# Patient Record
Sex: Female | Born: 1965 | Race: Black or African American | Hispanic: No | Smoking: Never smoker
Health system: Southern US, Community
[De-identification: ages and names within clinical notes are randomized; demographics above are authoritative.]

## PROBLEM LIST (undated history)

## (undated) DIAGNOSIS — K219 Gastro-esophageal reflux disease without esophagitis: Secondary | ICD-10-CM

## (undated) DIAGNOSIS — R569 Unspecified convulsions: Secondary | ICD-10-CM

## (undated) DIAGNOSIS — G893 Neoplasm related pain (acute) (chronic): Secondary | ICD-10-CM

## (undated) DIAGNOSIS — F32A Depression, unspecified: Secondary | ICD-10-CM

## (undated) DIAGNOSIS — Z9221 Personal history of antineoplastic chemotherapy: Secondary | ICD-10-CM

## (undated) DIAGNOSIS — F419 Anxiety disorder, unspecified: Secondary | ICD-10-CM

## (undated) DIAGNOSIS — C50919 Malignant neoplasm of unspecified site of unspecified female breast: Secondary | ICD-10-CM

## (undated) HISTORY — PX: BRAIN SURGERY: SHX531

## (undated) HISTORY — DX: Neoplasm related pain (acute) (chronic): G89.3

## (undated) HISTORY — PX: ORIF ANKLE FRACTURE: SHX5408

## (undated) HISTORY — PX: BREAST SURGERY: SHX581

## (undated) HISTORY — DX: Anxiety disorder, unspecified: F41.9

## (undated) HISTORY — PX: MASTECTOMY: SHX3

## (undated) HISTORY — DX: Malignant neoplasm of unspecified site of unspecified female breast: C50.919

## (undated) HISTORY — DX: Unspecified convulsions: R56.9

## (undated) HISTORY — DX: Gastro-esophageal reflux disease without esophagitis: K21.9

## (undated) HISTORY — DX: Personal history of antineoplastic chemotherapy: Z92.21

## (undated) HISTORY — DX: Depression, unspecified: F32.A

---

## 2018-11-03 LAB — CMP (EXT)
ALT/SGPT (EXT): 8 U/L (ref <34)
AST/SGOT (EXT): 13 U/L (ref <33)
Albumin (EXT): 4.2 g/dL (ref 3.5–5.2)
Alkaline Phosphatase (EXT): 70 U/L (ref 35–104)
Anion Gap (EXT): 12 mmol/L (ref 7–17)
BUN (EXT): 8 mg/dL (ref 6–23)
Bilirubin, Total (EXT): 0.6 mg/dL (ref 0.2–1.2)
CO2 (EXT): 26 mmol/L (ref 22–31)
CalciumCalcium (EXT): 9.4 mg/dL (ref 8.8–10.7)
Chloride (EXT): 103 mmol/L (ref 98–107)
Creatinine (EXT): 1.02 mg/dL (ref 0.50–1.20)
GFR Estimated (Calc) (EXT): 63 mL/min/{1.73_m2} (ref >59)
Globulin (EXT): 3.2 g/dL (ref 2.3–4.2)
Glucose (EXT): 113 mg/dL — ABNORMAL HIGH (ref 70–100)
Potassium (EXT): 3.6 mmol/L (ref 3.4–5.1)
Protein (EXT): 7.4 g/dL (ref 6.4–8.3)
Sodium (EXT): 141 mmol/L (ref 136–145)

## 2019-01-16 LAB — CMP (EXT)
ALT/SGPT (EXT): 6 U/L (ref <34)
AST/SGOT (EXT): 11 U/L (ref <33)
Albumin (EXT): 3.9 g/dL (ref 3.5–5.2)
Alkaline Phosphatase (EXT): 72 U/L (ref 35–104)
Anion Gap (EXT): 10 mmol/L (ref 7–17)
BUN (EXT): 12 mg/dL (ref 6–23)
Bilirubin, Total (EXT): 0.4 mg/dL (ref 0.2–1.2)
CO2 (EXT): 27 mmol/L (ref 22–31)
CalciumCalcium (EXT): 8.7 mg/dL — ABNORMAL LOW (ref 8.8–10.7)
Chloride (EXT): 106 mmol/L (ref 98–107)
Creatinine (EXT): 0.96 mg/dL (ref 0.50–1.20)
GFR Estimated (Calc) (EXT): 68 mL/min/{1.73_m2} (ref >59)
Globulin (EXT): 2.7 g/dL (ref 2.3–4.2)
Glucose (EXT): 101 mg/dL — ABNORMAL HIGH (ref 70–100)
Potassium (EXT): 3.9 mmol/L (ref 3.4–5.1)
Protein (EXT): 6.6 g/dL (ref 6.4–8.3)
Sodium (EXT): 143 mmol/L (ref 136–145)

## 2019-07-09 LAB — CMP (EXT)
ALT/SGPT (EXT): 6 U/L (ref <34)
AST/SGOT (EXT): 12 U/L (ref <33)
Albumin (EXT): 3.8 g/dL (ref 3.5–5.2)
Alkaline Phosphatase (EXT): 88 U/L (ref 35–104)
Anion Gap (EXT): 10 mmol/L (ref 7–17)
BUN (EXT): 11 mg/dL (ref 6–23)
Bilirubin, Total (EXT): 0.7 mg/dL (ref 0.2–1.2)
CO2 (EXT): 25 mmol/L (ref 22–31)
CalciumCalcium (EXT): 8.8 mg/dL (ref 8.8–10.7)
Chloride (EXT): 107 mmol/L (ref 98–107)
Creatinine (EXT): 0.95 mg/dL (ref 0.50–1.20)
GFR Estimated (Calc) (EXT): 69 mL/min/{1.73_m2} (ref >59)
Globulin (EXT): 3.2 g/dL (ref 2.3–4.2)
Glucose (EXT): 92 mg/dL (ref 70–100)
Potassium (EXT): 3.9 mmol/L (ref 3.4–5.1)
Protein (EXT): 7 g/dL (ref 6.4–8.3)
Sodium (EXT): 142 mmol/L (ref 136–145)

## 2019-10-09 LAB — CREATININE W/ EGFR (EXT)
Creatinine (EXT): 0.97 mg/dL (ref 0.50–1.20)
GFR Estimated (Calc) (EXT): 67 mL/min/{1.73_m2} (ref >59)

## 2019-10-09 LAB — UNMAPPED LAB RESULTS: BUN (EXT): 19 mg/dL (ref 6–23)

## 2019-12-15 LAB — CMP (EXT)
ALT/SGPT (EXT): 7 U/L (ref <34)
AST/SGOT (EXT): 14 U/L (ref <33)
Albumin (EXT): 4.3 g/dL (ref 3.5–5.2)
Alkaline Phosphatase (EXT): 82 U/L (ref 35–104)
Anion Gap (EXT): 7 mmol/L (ref 7–17)
BUN (EXT): 10 mg/dL (ref 6–23)
Bilirubin, Total (EXT): 0.5 mg/dL (ref 0.2–1.2)
CO2 (EXT): 28 mmol/L (ref 22–31)
CalciumCalcium (EXT): 9.3 mg/dL (ref 8.8–10.7)
Chloride (EXT): 104 mmol/L (ref 98–107)
Creatinine (EXT): 0.82 mg/dL (ref 0.50–1.20)
GFR Estimated (Calc) (EXT): 82 mL/min/{1.73_m2} (ref >59)
Globulin (EXT): 2.9 g/dL (ref 2.3–4.2)
Glucose (EXT): 91 mg/dL (ref 70–100)
Potassium (EXT): 4.7 mmol/L (ref 3.4–5.1)
Protein (EXT): 7.2 g/dL (ref 6.4–8.3)
Sodium (EXT): 139 mmol/L (ref 136–145)

## 2019-12-15 LAB — COMPREHENSIVE METABOLIC PANEL
Albumin: 4.3 (ref 3.5–5.0)
Calcium: 9.3 (ref 8.7–10.7)
Globulin: 2.9

## 2019-12-15 LAB — HEPATIC FUNCTION PANEL
ALT: 7 (ref 7–35)
AST: 14 (ref 13–35)
Alkaline Phosphatase: 82 (ref 25–125)

## 2019-12-15 LAB — BASIC METABOLIC PANEL
BUN: 10 (ref 4–21)
CO2: 28 — AB (ref 13–22)
Chloride: 104 (ref 99–108)
Creatinine: 0.8 (ref <=1.1)
Glucose: 91
Potassium: 4.7 (ref 3.4–5.3)
Sodium: 139 (ref 137–147)

## 2019-12-19 ENCOUNTER — Ambulatory Visit: Admitting: Specialist

## 2020-01-04 LAB — OCCULT BLOOD, FECAL (EXT)
Occult Blood, Fecal Spec 1 (EXT): NEGATIVE
Occult Blood, Fecal Spec 2 (EXT): NEGATIVE
Occult Blood, Fecal Spec 3 (EXT): NEGATIVE

## 2020-01-19 ENCOUNTER — Ambulatory Visit: Admitting: Specialist

## 2020-02-15 LAB — CBC AND DIFFERENTIAL
HCT: 37 (ref 36–46)
Hemoglobin: 11.9 — AB (ref 12.0–16.0)
Platelets: 175 (ref 150–399)
WBC: 2.7

## 2020-02-15 LAB — IRON,TIBC AND FERRITIN PANEL
Ferritin: 15
Iron: 67
UIBC: 241

## 2020-02-15 LAB — CBC: RBC: 3.98 (ref 3.87–5.11)

## 2020-02-15 LAB — VITAMIN B12: Vitamin B-12: 448

## 2020-04-06 ENCOUNTER — Other Ambulatory Visit: Payer: Self-pay

## 2020-04-06 ENCOUNTER — Ambulatory Visit: Payer: Medicare Other | Admitting: Podiatry

## 2020-04-22 ENCOUNTER — Other Ambulatory Visit: Payer: Self-pay

## 2020-04-22 ENCOUNTER — Ambulatory Visit (INDEPENDENT_AMBULATORY_CARE_PROVIDER_SITE_OTHER): Payer: Medicare Other | Admitting: Podiatry

## 2020-04-22 ENCOUNTER — Encounter: Payer: Self-pay | Admitting: Podiatry

## 2020-04-22 VITALS — BP 130/89 | HR 48

## 2020-04-22 DIAGNOSIS — Q828 Other specified congenital malformations of skin: Secondary | ICD-10-CM | POA: Diagnosis not present

## 2020-04-22 DIAGNOSIS — T451X5A Adverse effect of antineoplastic and immunosuppressive drugs, initial encounter: Secondary | ICD-10-CM

## 2020-04-22 DIAGNOSIS — M2042 Other hammer toe(s) (acquired), left foot: Secondary | ICD-10-CM

## 2020-04-22 DIAGNOSIS — B353 Tinea pedis: Secondary | ICD-10-CM | POA: Diagnosis not present

## 2020-04-22 DIAGNOSIS — M79675 Pain in left toe(s): Secondary | ICD-10-CM

## 2020-04-22 DIAGNOSIS — M2041 Other hammer toe(s) (acquired), right foot: Secondary | ICD-10-CM | POA: Diagnosis not present

## 2020-04-22 MED ORDER — CLOTRIMAZOLE-BETAMETHASONE 1-0.05 % EX CREA: TOPICAL_CREAM | CUTANEOUS | 1 refills | Status: DC

## 2020-04-22 NOTE — Patient Instructions (Addendum)
Hammer Toe  Hammer toe is a change in the shape (a deformity) of your toe. The deformity causes the middle joint of your toe to stay bent. This causes pain, especially when you are wearing shoes. Hammer toe starts gradually. At first, the toe can be straightened. Gradually over time, the deformity becomes stiff and permanent. Early treatments to keep the toe straight may relieve pain. As the deformity becomes stiff and permanent, surgery may be needed to straighten the toe. What are the causes? Hammer toe is caused by abnormal bending of the toe joint that is closest to your foot. It happens gradually over time. This pulls on the muscles and connections (tendons) of the toe joint, making them weak and stiff. It is often related to wearing shoes that are too short or narrow and do not let your toes straighten. What increases the risk? You may be at greater risk for hammer toe if you:  Are female.  Are older.  Wear shoes that are too small.  Wear high-heeled shoes that pinch your toes.  Are a ballet dancer.  Have a second toe that is longer than your big toe (first toe).  Injure your foot or toe.  Have arthritis.  Have a family history of hammer toe.  Have a nerve or muscle disorder. What are the signs or symptoms? The main symptoms of this condition are pain and deformity of the toe. The pain is worse when wearing shoes, walking, or running. Other symptoms may include:  Corns or calluses over the bent part of the toe or between the toes.  Redness and a burning feeling on the toe.  An open sore that forms on the top of the toe.  Not being able to straighten the toe. How is this diagnosed? This condition is diagnosed based on your symptoms and a physical exam. During the exam, your health care provider will try to straighten your toe to see how stiff the deformity is. You may also have tests, such as:  A blood test to check for rheumatoid arthritis.  An X-ray to show how  severe the deformity is. How is this treated? Treatment for this condition will depend on how stiff the deformity is. Surgery is often needed. However, sometimes a hammer toe can be straightened without surgery. Treatments that do not involve surgery include:  Taping the toe into a straightened position.  Using pads and cushions to protect the toe (orthotics).  Wearing shoes that provide enough room for the toes.  Doing toe-stretching exercises at home.  Taking an NSAID to reduce pain and swelling. If these treatments do not help or the toe cannot be straightened, surgery is the next option. The most common surgeries used to straighten a hammer toe include:  Arthroplasty. In this procedure, part of the joint is removed, and that allows the toe to straighten.  Fusion. In this procedure, cartilage between the two bones of the joint is taken out and the bones are fused together into one longer bone.  Implantation. In this procedure, part of the bone is removed and replaced with an implant to let the toe move again.  Flexor tendon transfer. In this procedure, the tendons that curl the toes down (flexor tendons) are repositioned. Follow these instructions at home:  Take over-the-counter and prescription medicines only as told by your health care provider.  Do toe straightening and stretching exercises as told by your health care provider.  Keep all follow-up visits as told by your health care   provider. This is important. How is this prevented?  Wear shoes that give your toes enough room and do not cause pain.  Do not wear high-heeled shoes. Contact a health care provider if:  Your pain gets worse.  Your toe becomes red or swollen.  You develop an open sore on your toe. This information is not intended to replace advice given to you by your health care provider. Make sure you discuss any questions you have with your health care provider. Document Revised: 07/26/2017 Document  Reviewed: 12/07/2015 Elsevier Patient Education  [place] are small areas of thickened skin that occur on the top, sides, or tip of a toe. They contain a cone-shaped core with a point that can press on a nerve below. This causes pain.  Calluses are areas of thickened skin that can occur anywhere on the body, including the hands, fingers, palms, soles of the feet, and heels. Calluses are usually larger than corns. What are the causes? Corns and calluses are caused by rubbing (friction) or pressure, such as from shoes that are too tight or do not fit properly. What increases the risk? Corns are more likely to develop in people who have misshapen toes (toe deformities), such as hammer toes. Calluses can occur with friction to any area of the skin. They are more likely to develop in people who:  Work with their hands.  Wear shoes that fit poorly, are too tight, or are high-heeled.  Have toe deformities. What are the signs or symptoms? Symptoms of a corn or callus include:  A hard growth on the skin.  Pain or tenderness under the skin.  Redness and swelling.  Increased discomfort while wearing tight-fitting shoes, if your feet are affected. If a corn or callus becomes infected, symptoms may include:  Redness and swelling that gets worse.  Pain.  Fluid, blood, or pus draining from the corn or callus. How is this diagnosed? Corns and calluses may be diagnosed based on your symptoms, your medical history, and a physical exam. How is this treated? Treatment for corns and calluses may include:  Removing the cause of the friction or pressure. This may involve: ? Changing your shoes. ? Wearing shoe inserts (orthotics) or other protective layers in your shoes, such as a corn pad. ? Wearing gloves.  Applying medicine to the skin (topical medicine) to help soften skin in the hardened, thickened areas.  Removing layers of dead skin with a file to  reduce the size of the corn or callus.  Removing the corn or callus with a scalpel or laser.  Taking antibiotic medicines, if your corn or callus is infected.  Having surgery, if a toe deformity is the cause. Follow these instructions at home:   Take over-the-counter and prescription medicines only as told by your health care provider.  If you were prescribed an antibiotic, take it as told by your health care provider. Do not stop taking it even if your condition starts to improve.  Wear shoes that fit well. Avoid wearing high-heeled shoes and shoes that are too tight or too loose.  Wear any padding, protective layers, gloves, or orthotics as told by your health care provider.  Soak your hands or feet and then use a file or pumice stone to soften your corn or callus. Do this as told by your health care provider.  Check your corn or callus every day for symptoms of infection. Contact a health care provider if you:  Notice that your symptoms do not improve with treatment.  Have redness or swelling that gets worse.  Notice that your corn or callus becomes painful.  Have fluid, blood, or pus coming from your corn or callus.  Have new symptoms. Summary  Corns are small areas of thickened skin that occur on the top, sides, or tip of a toe.  Calluses are areas of thickened skin that can occur anywhere on the body, including the hands, fingers, palms, and soles of the feet. Calluses are usually larger than corns.  Corns and calluses are caused by rubbing (friction) or pressure, such as from shoes that are too tight or do not fit properly.  Treatment may include wearing any padding, protective layers, gloves, or orthotics as told by your health care provider. This information is not intended to replace advice given to you by your health care provider. Make sure you discuss any questions you have with your health care provider. Document Revised: 12/03/2018 Document Reviewed:  06/26/2017 Elsevier Patient Education  2020 [employer].

## 2020-04-24 ENCOUNTER — Encounter: Payer: Self-pay | Admitting: Podiatry

## 2020-04-24 NOTE — Progress Notes (Signed)
Subjective: Monica Mccoy presents today for complaint of painful corn(s) b/l 5th digits  which interfere(s) with ambulation. Corns have been present for the past 3-4 years. Aggravating factors include wearing enclosed shoe gear.  She has also used OTC corn remover on the digits.   She has just moved to [place] from [place]. She has not seen a new PCP as of today.  She last saw her PCP, Monica Mccoy. Monica Mccoy, M.D, on 02/24/2020. Dr. Lacy Mccoy is located at [address], [place], [place], [place], [address].  Past Medical History:  Diagnosis Date  . Breast cancer metastasized to brain [facility])    Left breast  . Chronic pain after cancer treatment   . GERD (gastroesophageal reflux disease)   . History of cancer chemotherapy    Breast cancer left breast  . Seizure ([place])      There are no problems to display for this patient.    History reviewed. No pertinent surgical history.   Current Outpatient Medications on File Prior to Visit  Medication Sig Dispense Refill  . gabapentin (NEURONTIN) 300 MG capsule Take 300 mg by mouth 3 (three) times daily.    Marland Kitchen levETIRAcetam (KEPPRA) 1000 MG tablet Take 1,000 mg by mouth 2 (two) times daily.    . pantoprazole (PROTONIX) 40 MG tablet Take 40 mg by mouth daily.    Marland Kitchen PARoxetine (PAXIL) 20 MG tablet Take 20 mg by mouth daily.    . traZODone (DESYREL) 100 MG tablet Take 100 mg by mouth at bedtime.     No current facility-administered medications on file prior to visit.     Allergies  Allergen Reactions  . Penicillins   . Tramadol      Social History   Occupational History  . Not on file  Tobacco Use  . Smoking status: Never Smoker  . Smokeless tobacco: Never Used  Substance and Sexual Activity  . Alcohol use: Yes    Comment: On occasion  . Drug use: Not on file  . Sexual activity: Not on file     History reviewed. No pertinent family history.    There is no immunization history on file for this patient.    Objective: Monica Mccoy is a pleasant 54 y.o. female morbidly obese in NAD.Marland Kitchen AAO x 3.  Vitals:   04/22/20 0934  BP: 130/89  Pulse: (!) 48    Vascular Examination:  Capillary fill time to digits <3 seconds b/l lower extremities. Palpable DP pulse(s) b/l lower extremities Faintly palpable PT pulse(s) b/l lower extremities. Pedal hair absent. Lower extremity skin temperature gradient within normal limits. No pain with calf compression b/l. No edema noted b/l lower extremities. No ischemia or gangrene noted b/l lower extremities.  Dermatological Examination: Pedal skin with normal turgor, texture and tone bilaterally. No interdigital macerations bilaterally. Toenails 1-5 b/l well maintained with adequate length. No erythema, no edema, no drainage, no flocculence. Porokeratotic lesion(s) L 5th toe and R 5th toe. No erythema, no edema, no drainage, no flocculence. Diffuse scaling noted peripherally and plantarly b/l feet with mild foot odor.  No interdigital macerations.  No blisters, no weeping. No signs of secondary bacterial infection noted.  Musculoskeletal: Normal muscle strength 5/5 to all lower extremity muscle groups bilaterally. Hammertoes noted to the L 5th toe and R 5th toe.  Neurological: Pt has subjective symptoms of neuropathy. Protective sensation decreased with 10 gram monofilament b/l. Vibratory sensation intact b/l.  Assessment: 1. Porokeratosis   2. Pain in toes of both feet  3. Tinea pedis of both feet   4. Acquired hammertoes of both feet   5. Chemotherapy-induced neuropathy ([place])     Plan: -Examined patient. -Painful porokeratotic lesion(s) L 5th toe and R 5th toe pared and enucleated with sterile scalpel blade without incident. -Patient to report any pedal injuries to medical professional immediately. -For tinea pedis, prescription written for Lotrisone Cream 1%/0.05% to be applied to both feet twice daily for 6 weeks. -Patient to continue soft, supportive  shoe gear daily. -Patient/POA to call should there be question/concern in the interim.  Return in about 3 months (around 07/23/2020) for nail and callus trim.

## 2020-06-28 ENCOUNTER — Other Ambulatory Visit: Payer: Self-pay

## 2020-06-29 ENCOUNTER — Encounter: Payer: Self-pay | Admitting: Nurse Practitioner

## 2020-06-29 ENCOUNTER — Ambulatory Visit (INDEPENDENT_AMBULATORY_CARE_PROVIDER_SITE_OTHER): Payer: Medicare Other | Admitting: Nurse Practitioner

## 2020-06-29 VITALS — BP 136/90 | HR 88 | Temp 97.1°F | Ht 65.75 in | Wt 294.2 lb

## 2020-06-29 DIAGNOSIS — G8929 Other chronic pain: Secondary | ICD-10-CM | POA: Diagnosis not present

## 2020-06-29 DIAGNOSIS — Z87898 Personal history of other specified conditions: Secondary | ICD-10-CM | POA: Insufficient documentation

## 2020-06-29 DIAGNOSIS — Z853 Personal history of malignant neoplasm of breast: Secondary | ICD-10-CM | POA: Insufficient documentation

## 2020-06-29 DIAGNOSIS — F331 Major depressive disorder, recurrent, moderate: Secondary | ICD-10-CM | POA: Diagnosis not present

## 2020-06-29 DIAGNOSIS — K219 Gastro-esophageal reflux disease without esophagitis: Secondary | ICD-10-CM | POA: Diagnosis not present

## 2020-06-29 DIAGNOSIS — R32 Unspecified urinary incontinence: Secondary | ICD-10-CM | POA: Insufficient documentation

## 2020-06-29 DIAGNOSIS — Z8669 Personal history of other diseases of the nervous system and sense organs: Secondary | ICD-10-CM

## 2020-06-29 MED ORDER — PANTOPRAZOLE SODIUM 40 MG PO TBEC: 40.0000 mg | DELAYED_RELEASE_TABLET | Freq: Every day | ORAL | 0 refills | Status: DC

## 2020-06-29 MED ORDER — GABAPENTIN 300 MG PO CAPS: ORAL_CAPSULE | ORAL | 5 refills | Status: DC

## 2020-06-29 MED ORDER — DULOXETINE HCL 30 MG PO CPEP: 30.0000 mg | ORAL_CAPSULE | Freq: Every day | ORAL | 5 refills | Status: DC

## 2020-06-29 NOTE — Patient Instructions (Signed)
Thank you for choosing [facility] for your health needs  Sign medical release to get records from previous neurology, primary provider, urology, and oncology.  Hold trazodone for now.  You will be contacted to schedule an appt with oncology and psychology

## 2020-06-29 NOTE — Assessment & Plan Note (Signed)
Resume pantoprazole To decrease dose to 20mg  if resolved symptoms in 73month.

## 2020-06-29 NOTE — Assessment & Plan Note (Signed)
Reports generalized joint and muscle pain. She was evaluated by pain clinic in [place], water aerobic recommended but she did not complete. She ran out of medication 63month ago: gabapentin 300 TID. Obtain medical records Resume gabapentin 300mg  titrate up to TID. start cymbalta 30mg .

## 2020-06-29 NOTE — Assessment & Plan Note (Signed)
S/p left mastectomy with possible brain mets per patient. She is to sign medical release form for records Entered oncology referral

## 2020-06-29 NOTE — Assessment & Plan Note (Signed)
No improvement with paxil No SI Start cymbalta 30mg , hold trazodone and entered referral to psychology. F/up in 17month

## 2020-06-29 NOTE — Assessment & Plan Note (Signed)
Reports she was unable to tolerate keppra Does not know how long she has been off medication Reports seizure activity during hospitalization 46months ago

## 2020-06-29 NOTE — Progress Notes (Signed)
Subjective:  Patient ID: Monica Mccoy, female    DOB: 1965-11-13  Age: 54 y.o. MRN: [id number]  CC: Establish Care (New patient-Pt c/o bad headaches x3 weeks, pt has been out of trazodone, pantoprazole, and gabapentin, keppra, paroxetine, and states this could be the cause of the headaches. )  HPI  Gastroesophageal reflux disease without esophagitis Resume pantoprazole To decrease dose to 20mg  if resolved symptoms in 30month.  Hx of breast cancer S/p left mastectomy with possible brain mets per patient. She is to sign medical release form for records Entered oncology referral  Other chronic pain Reports generalized joint and muscle pain. She was evaluated by pain clinic in [place], water aerobic recommended but she did not complete. She ran out of medication 3month ago: gabapentin 300 TID. Obtain medical records Resume gabapentin 300mg  titrate up to TID. start cymbalta 30mg .  Moderate episode of recurrent major depressive disorder (HCC) No improvement with paxil No SI Start cymbalta 30mg , hold trazodone and entered referral to psychology. F/up in 26month  Hx of seizure disorder Reports she was unable to tolerate keppra Does not know how long she has been off medication Reports seizure activity during hospitalization 85months ago  Depression screen [facility] 2/9 06/29/2020  Decreased Interest 2  Down, Depressed, Hopeless 2  PHQ - 2 Score 4  Altered sleeping 3  Tired, decreased energy 2  Change in appetite 2  Feeling bad or failure about yourself  0  Trouble concentrating 1  Moving slowly or fidgety/restless 2  Suicidal thoughts 0  PHQ-9 Score 14  Difficult doing work/chores Very difficult   GAD 7 : Generalized Anxiety Score 06/29/2020  Nervous, Anxious, on Edge 3  Control/stop worrying 2  Worry too much - different things 2  Trouble relaxing 3  Restless 2  Easily annoyed or irritable 2  Afraid - awful might happen 1  Total GAD 7 Score 15  Anxiety Difficulty Somewhat  difficult   Reviewed past Medical, Social and Family history today.  Outpatient Medications Prior to Visit  Medication Sig Dispense Refill  . clotrimazole-betamethasone (LOTRISONE) cream Apply to both feet and between toes bid x 6 weeks. 45 g 1  . gabapentin (NEURONTIN) 300 MG capsule Take 300 mg by mouth 3 (three) times daily.    Marland Kitchen levETIRAcetam (KEPPRA) 1000 MG tablet Take 1,000 mg by mouth 2 (two) times daily.    . pantoprazole (PROTONIX) 40 MG tablet Take 40 mg by mouth daily.    Marland Kitchen PARoxetine (PAXIL) 20 MG tablet Take 20 mg by mouth daily.    . traZODone (DESYREL) 100 MG tablet Take 100 mg by mouth at bedtime.     No facility-administered medications prior to visit.    ROS See HPI  Objective:  BP 136/90 (BP Location: Right Arm, Patient Position: Sitting, Cuff Size: Large)   Pulse 88   Temp (!) 97.1 F (36.2 C) (Temporal)   Ht 5' 5.75" (1.67 m)   Wt 294 lb 3.2 oz (133.4 kg)   SpO2 100%   BMI 47.85 kg/m   Physical Exam Vitals reviewed.  Constitutional:      Appearance: She is obese.  Cardiovascular:     Rate and Rhythm: Normal rate and regular rhythm.     Pulses: Normal pulses.     Heart sounds: Normal heart sounds.  Pulmonary:     Effort: Pulmonary effort is normal.     Breath sounds: Normal breath sounds.  Musculoskeletal:     Right lower leg: No edema.  Left lower leg: No edema.  Neurological:     Mental Status: She is alert and oriented to person, place, and time.  Psychiatric:        Mood and Affect: Mood is anxious. Affect is tearful.        Speech: Speech normal.        Behavior: Behavior is cooperative.        Thought Content: Thought content normal.    Assessment & Plan:  This visit occurred during the SARS-CoV-2 public health emergency.  Safety protocols were in place, including screening questions prior to the visit, additional usage of staff PPE, and extensive cleaning of exam room while observing appropriate contact time as indicated for  disinfecting solutions.   Monica Mccoy was seen today for establish care.  Diagnoses and all orders for this visit:  Moderate episode of recurrent major depressive disorder (HCC) -     DULoxetine (CYMBALTA) 30 MG capsule; Take 1 capsule (30 mg total) by mouth daily. -     Ambulatory referral to Psychology  Hx of breast cancer -     Ambulatory referral to Oncology  Other chronic pain -     gabapentin (NEURONTIN) 300 MG capsule; Take 1cap at HS x 3days, then 1cap BID x7days, then 1cal TID continuously  Gastroesophageal reflux disease without esophagitis -     pantoprazole (PROTONIX) 40 MG tablet; Take 1 tablet (40 mg total) by mouth daily.  Hx of seizure disorder    Problem List Items Addressed This Visit      Digestive   Gastroesophageal reflux disease without esophagitis    Resume pantoprazole To decrease dose to 20mg  if resolved symptoms in 21month.      Relevant Medications   pantoprazole (PROTONIX) 40 MG tablet     Other   Hx of breast cancer    S/p left mastectomy with possible brain mets per patient. She is to sign medical release form for records Entered oncology referral      Relevant Orders   Ambulatory referral to Oncology   Hx of seizure disorder    Reports she was unable to tolerate keppra Does not know how long she has been off medication Reports seizure activity during hospitalization 20months ago      Moderate episode of recurrent major depressive disorder ([place]) - Primary    No improvement with paxil No SI Start cymbalta 30mg , hold trazodone and entered referral to psychology. F/up in 89month      Relevant Medications   DULoxetine (CYMBALTA) 30 MG capsule   Other Relevant Orders   Ambulatory referral to Psychology   Other chronic pain    Reports generalized joint and muscle pain. She was evaluated by pain clinic in [place], water aerobic recommended but she did not complete. She ran out of medication 34month ago: gabapentin 300 TID. Obtain medical  records Resume gabapentin 300mg  titrate up to TID. start cymbalta 30mg .      Relevant Medications   gabapentin (NEURONTIN) 300 MG capsule   DULoxetine (CYMBALTA) 30 MG capsule      Follow-up: Return in about 4 weeks (around 07/27/2020) for Depression and pain (F2F, 26mins, fasting).  Wilfred Lacy, NP

## 2020-07-01 ENCOUNTER — Encounter: Payer: Self-pay | Admitting: Podiatry

## 2020-07-01 ENCOUNTER — Ambulatory Visit (INDEPENDENT_AMBULATORY_CARE_PROVIDER_SITE_OTHER): Payer: Medicare Other | Admitting: Podiatry

## 2020-07-01 ENCOUNTER — Other Ambulatory Visit: Payer: Self-pay

## 2020-07-01 DIAGNOSIS — M2042 Other hammer toe(s) (acquired), left foot: Secondary | ICD-10-CM | POA: Diagnosis not present

## 2020-07-01 DIAGNOSIS — M2041 Other hammer toe(s) (acquired), right foot: Secondary | ICD-10-CM

## 2020-07-01 DIAGNOSIS — B351 Tinea unguium: Secondary | ICD-10-CM | POA: Diagnosis not present

## 2020-07-01 DIAGNOSIS — M79675 Pain in left toe(s): Secondary | ICD-10-CM | POA: Diagnosis not present

## 2020-07-01 DIAGNOSIS — M79674 Pain in right toe(s): Secondary | ICD-10-CM

## 2020-07-02 NOTE — Progress Notes (Signed)
Subjective:   Patient ID: Monica Mccoy, female   DOB: 54 y.o.   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  Patient wants surgery but is going out of town in the next few months and wants to wait till afterwards and I did educate her on what would be required for surgical correction

## 2020-08-01 ENCOUNTER — Ambulatory Visit: Payer: Medicare Other | Admitting: Nurse Practitioner

## 2020-08-04 ENCOUNTER — Telehealth: Payer: Self-pay | Admitting: Nurse Practitioner

## 2020-08-04 NOTE — Telephone Encounter (Signed)
ok 

## 2020-08-04 NOTE — Telephone Encounter (Signed)
Ok with me as her mother is my patient

## 2020-08-04 NOTE — Telephone Encounter (Signed)
Patient is requesting to transfer her care from [facility] to Dr. Bryan Lemma. Stated she felt is was not a good fit.

## 2020-08-05 ENCOUNTER — Ambulatory Visit: Payer: Medicare Other | Admitting: Podiatry

## 2020-08-05 ENCOUNTER — Other Ambulatory Visit: Payer: Self-pay | Admitting: Nurse Practitioner

## 2020-08-05 DIAGNOSIS — F331 Major depressive disorder, recurrent, moderate: Secondary | ICD-10-CM

## 2020-08-05 NOTE — Telephone Encounter (Signed)
Last 0V 06/29/20 Last fill 06/29/20  #30/5 Pt requesting for #90 supply

## 2020-08-29 NOTE — Progress Notes (Signed)
Subjective:   Monica Mccoy is a 55 y.o. female who presents for Medicare Annual (Subsequent) preventive examination.  I connected with [place] today by telephone and verified that I am speaking with the correct person using two identifiers. Location patient: home Location provider: work Persons participating in the virtual visit: patient, Engineer, civil (consulting).    I discussed the limitations, risks, security and privacy concerns of performing an evaluation and management service by telephone and the availability of in person appointments. I also discussed with the patient that there may be a patient responsible charge related to this service. The patient expressed understanding and verbally consented to this telephonic visit.    Interactive audio and video telecommunications were attempted between this provider and patient, however failed, due to patient having technical difficulties OR patient did not have access to video capability.  We continued and completed visit with audio only.  Some vital signs may be absent or patient reported.   Time Spent with patient on telephone encounter: 40 minutes   Review of Systems     Cardiac Risk Factors include: obesity (BMI >30kg/m2);sedentary lifestyle     Objective:    Today's Vitals   08/30/20 0902 08/30/20 0903  Weight: 297 lb (134.7 kg)   Height: 5' 5.75" (1.67 m)   PainSc:  8    Body mass index is 48.3 kg/m.  Advanced Directives 08/30/2020  Does Patient Have a Medical Advance Directive? No  Would patient like information on creating a medical advance directive? Yes (MAU/Ambulatory/Procedural Areas - Information given)    Current Medications (verified) Outpatient Encounter Medications as of 08/30/2020  Medication Sig  . clotrimazole-betamethasone (LOTRISONE) cream Apply to both feet and between toes bid x 6 weeks.  . DULoxetine (CYMBALTA) 30 MG capsule TAKE 1 CAPSULE BY MOUTH EVERY DAY  . gabapentin (NEURONTIN) 300 MG capsule Take 1cap at HS x  3days, then 1cap BID x7days, then 1cal TID continuously  . pantoprazole (PROTONIX) 40 MG tablet Take 1 tablet (40 mg total) by mouth daily.   No facility-administered encounter medications on file as of 08/30/2020.    Allergies (verified) Penicillins and Tramadol   History: Past Medical History:  Diagnosis Date  . Breast cancer metastasized to brain [facility])    Left breast  . Chronic pain after cancer treatment   . GERD (gastroesophageal reflux disease)   . History of cancer chemotherapy    Breast cancer left breast  . Seizure [facility])    Past Surgical History:  Procedure Laterality Date  . BRAIN SURGERY     brain tumor  . BREAST SURGERY     History reviewed. No pertinent family history. Social History   Socioeconomic History  . Marital status: Single    Spouse name: Not on file  . Number of children: Not on file  . Years of education: Not on file  . Highest education level: Not on file  Occupational History  . Not on file  Tobacco Use  . Smoking status: Never Smoker  . Smokeless tobacco: Never Used  Substance and Sexual Activity  . Alcohol use: Yes    Comment: On occasion  . Drug use: Not on file  . Sexual activity: Not on file  Other Topics Concern  . Not on file  Social History Narrative  . Not on file   Social Determinants of Health   Financial Resource Strain: Medium Risk  . Difficulty of Paying Living Expenses: Somewhat hard  Food Insecurity: Food Insecurity Present  . Worried About Running  Out of Food in the Last Year: Sometimes true  . Ran Out of Food in the Last Year: Never true  Transportation Needs: No Transportation Needs  . Lack of Transportation (Medical): No  . Lack of Transportation (Non-Medical): No  Physical Activity: Insufficiently Active  . Days of Exercise per Week: 1 day  . Minutes of Exercise per Session: 30 min  Stress: No Stress Concern Present  . Feeling of Stress : Only a little  Social Connections: Socially Isolated  . Frequency of  Communication with Friends and Family: More than three times a week  . Frequency of Social Gatherings with Friends and Family: Once a week  . Attends Religious Services: Never  . Active Member of Clubs or Organizations: No  . Attends Archivist Meetings: Never  . Marital Status: Never married    Tobacco Counseling Counseling given: Not Answered   Clinical Intake:  Pre-visit preparation completed: Yes  Pain : 0-10 Pain Score: 8  Pain Type: Chronic pain Pain Location: Leg (patient states everything hurts) Pain Orientation: Right,Left Pain Onset: More than a month ago Pain Frequency: Constant     Nutritional Status: BMI > 30  Obese Nutritional Risks: Unintentional weight gain (pt says she has gained 30 lbs in 5 months) Diabetes: No  How often do you need to have someone help you when you read instructions, pamphlets, or other written materials from your doctor or pharmacy?: 1 - Never  Diabetic?No  Interpreter Needed?: No  Information entered by :: Caroleen Hamman LPN   Activities of Daily Living In your present state of health, do you have any difficulty performing the following activities: 08/30/2020  Hearing? N  Vision? N  Difficulty concentrating or making decisions? Y  Comment occasionally  Walking or climbing stairs? N  Dressing or bathing? N  Doing errands, shopping? Y  Comment needs Agricultural engineer and eating ? Y  Comment needs assistance  Using the Toilet? N  In the past six months, have you accidently leaked urine? Y  Comment wears depends  Do you have problems with loss of bowel control? N  Managing your Medications? Y  Comment needs assistance  Managing your Finances? N  Housekeeping or managing your Housekeeping? Y  Comment needs assistance    Patient Care Team: Nche, Charlene Brooke, NP as PCP - General (Internal Medicine) Pcp, No as PCP - Family Medicine  Indicate any recent Medical Services you may have received from other  than [facility] providers in the past year (date may be approximate).     Assessment:   This is a routine wellness examination for Jermya.  Hearing/Vision screen  Hearing Screening   125Hz  250Hz  500Hz  1000Hz  2000Hz  3000Hz  4000Hz  6000Hz  8000Hz   Right ear:           Left ear:           Comments: Patient says she does have some hearing loss  Vision Screening Comments: Wears glasses Last eye exam-01/2020  Dietary issues and exercise activities discussed: Current Exercise Habits: Home exercise routine, Type of exercise: strength training/weights;walking, Time (Minutes): 30, Frequency (Times/Week): 1, Weekly Exercise (Minutes/Week): 30, Intensity: Mild, Exercise limited by: orthopedic condition(s)  Goals    . Patient Stated     Would like to lose weight    . Patient Stated     Would like to start driving again      Depression Screen PHQ 2/9 Scores 08/30/2020 06/29/2020  PHQ - 2 Score 2 4  PHQ- 9  Score 7 14    Fall Risk Fall Risk  08/30/2020 06/29/2020  Falls in the past year? 1 1  Number falls in past yr: 1 1  Injury with Fall? 0 1  Risk for fall due to : History of fall(s);Impaired balance/gait -  Follow up Falls prevention discussed -    FALL RISK PREVENTION PERTAINING TO THE HOME:  Any stairs in or around the home? Yes  If so, are there any without handrails? No  Home free of loose throw rugs in walkways, pet beds, electrical cords, etc? Yes  Adequate lighting in your home to reduce risk of falls? Yes   ASSISTIVE DEVICES UTILIZED TO PREVENT FALLS:  Life alert? No  Use of a cane, walker or w/c? No  Grab bars in the bathroom? No  Shower chair or bench in shower? No  Elevated toilet seat or a handicapped toilet? Yes   TIMED UP AND GO:  Was the test performed? No . Phone visit  Cognitive Function:Normal cognitive status assessed by  this Nurse Health Advisor. No abnormalities found.          Immunizations  There is no immunization history on file for this  patient.  TDAP status: Due, Education has been provided regarding the importance of this vaccine. Advised may receive this vaccine at local pharmacy or Health Dept. Aware to provide a copy of the vaccination record if obtained from local pharmacy or Health Dept. Verbalized acceptance and understanding.  Flu Vaccine status: Due, Education has been provided regarding the importance of this vaccine. Advised may receive this vaccine at local pharmacy or Health Dept. Aware to provide a copy of the vaccination record if obtained from local pharmacy or Health Dept. Verbalized acceptance and understanding.  Pneumococcal vaccine status: Not yet indicated  Covid-19 vaccine status: Completed vaccines Patient to bring documentation to next office visit  Qualifies for Shingles Vaccine? Yes   Zostavax completed No   Shingrix Completed?: No.    Education has been provided regarding the importance of this vaccine. Patient has been advised to call insurance company to determine out of pocket expense if they have not yet received this vaccine. Advised may also receive vaccine at local pharmacy or Health Dept. Verbalized acceptance and understanding.  Screening Tests Health Maintenance  Topic Date Due  . Hepatitis C Screening  Never done  . COVID-19 Vaccine (1) Never done  . HIV Screening  Never done  . TETANUS/TDAP  Never done  . PAP SMEAR-Modifier  Never done  . COLONOSCOPY (Pts 45-62yrs Insurance coverage will need to be confirmed)  Never done  . INFLUENZA VACCINE  Never done  . MAMMOGRAM  08/30/2021 (Originally 09/03/2016)    Health Maintenance  Health Maintenance Due  Topic Date Due  . Hepatitis C Screening  Never done  . COVID-19 Vaccine (1) Never done  . HIV Screening  Never done  . TETANUS/TDAP  Never done  . PAP SMEAR-Modifier  Never done  . COLONOSCOPY (Pts 45-63yrs Insurance coverage will need to be confirmed)  Never done  . INFLUENZA VACCINE  Never done    Colorectal cancer screening:  Due-Declined at this time. Patient to call back when she is ready to schedule.  Mammogram status: No longer required due to bilateral mastectomy.  Bone Density status: Not yet indicated  Lung Cancer Screening: (Low Dose CT Chest recommended if Age 71-80 years, 30 pack-year currently smoking OR have quit w/in 15years.) does not qualify.     Additional Screening:  Hepatitis  C Screening: does not qualify  Vision Screening: Recommended annual ophthalmology exams for early detection of glaucoma and other disorders of the eye. Is the patient up to date with their annual eye exam?  Yes  Who is the provider or what is the name of the office in which the patient attends annual eye exams? Unsure of name   Dental Screening: Recommended annual dental exams for proper oral hygiene  Community Resource Referral / Chronic Care Management: CRR required this visit?  Yes For financial assistance  CCM required this visit?  No      Plan:     I have personally reviewed and noted the following in the patient's chart:   . Medical and social history . Use of alcohol, tobacco or illicit drugs  . Current medications and supplements . Functional ability and status . Nutritional status . Physical activity . Advanced directives . List of other physicians . Hospitalizations, surgeries, and ER visits in previous 12 months . Vitals . Screenings to include cognitive, depression, and falls . Referrals and appointments  In addition, I have reviewed and discussed with patient certain preventive protocols, quality metrics, and best practice recommendations. A written personalized care plan for preventive services as well as general preventive health recommendations were provided to patient.   Due to this being a telephonic visit, the after visit summary with patients personalized plan was offered to patient via mail or my-chart.  Patient preferred to pick up at office at next visit.   Marta Antu,  LPN   [id number]  Nurse Health Advisor  Nurse Notes: Patient states she is still having a lot of pain all over & she has some issues with depression. She has an appt with Dr. Bryan Lemma on 09/09/2020.

## 2020-08-30 ENCOUNTER — Encounter: Payer: Self-pay | Admitting: Nurse Practitioner

## 2020-08-30 ENCOUNTER — Telehealth: Payer: Self-pay | Admitting: Nurse Practitioner

## 2020-08-30 ENCOUNTER — Ambulatory Visit (INDEPENDENT_AMBULATORY_CARE_PROVIDER_SITE_OTHER): Payer: Medicare Other

## 2020-08-30 VITALS — Ht 65.75 in | Wt 297.0 lb

## 2020-08-30 DIAGNOSIS — Z0000 Encounter for general adult medical examination without abnormal findings: Secondary | ICD-10-CM

## 2020-08-30 NOTE — Telephone Encounter (Signed)
Pt was no show for appt 08/01/2020. Pt is scheduled to TOC to Dr. Barron [name] 09/09/20. 1st occurrence. Fee waived. Letter mailed.

## 2020-08-30 NOTE — Patient Instructions (Signed)
[name] , Thank you for taking time to complete your Medicare Wellness Visit. I appreciate your ongoing commitment to your health goals. Please review the following plan we discussed and let me know if I can assist you in the future.   Screening recommendations/referrals: Colonoscopy: Due-Declined today. Please call the office to schedule if you change your mind. Mammogram: Bilateral mastectomy Bone Density: Not yet indicated. Due at age 55. Recommended yearly ophthalmology/optometry visit for glaucoma screening and checkup Recommended yearly dental visit for hygiene and checkup  Vaccinations: Influenza vaccine: Due-May obtain vaccine at our office or your local pharmacy. Pneumococcal vaccine: Due at age 60 Tdap vaccine: Discuss with pharmacy Shingles vaccine: Discuss with pharmacy Covid-19:  Please bring documentation to your next office visit  Advanced directives: Information left at the front desk to be picked up at your next office visit.  Conditions/risks identified: See problem list  Next appointment: Follow up in one year for your annual wellness visit.   Preventive Care 40-64 Years, Female Preventive care refers to lifestyle choices and visits with your health care provider that can promote health and wellness. What does preventive care include?  A yearly physical exam. This is also called an annual well check.  Dental exams once or twice a year.  Routine eye exams. Ask your health care provider how often you should have your eyes checked.  Personal lifestyle choices, including:  Daily care of your teeth and gums.  Regular physical activity.  Eating a healthy diet.  Avoiding tobacco and drug use.  Limiting alcohol use.  Practicing safe sex.  Taking low-dose aspirin daily starting at age 72.  Taking vitamin and mineral supplements as recommended by your health care provider. What happens during an annual well check? The services and screenings done by your  health care provider during your annual well check will depend on your age, overall health, lifestyle risk factors, and family history of disease. Counseling  Your health care provider may ask you questions about your:  Alcohol use.  Tobacco use.  Drug use.  Emotional well-being.  Home and relationship well-being.  Sexual activity.  Eating habits.  Work and work Statistician.  Method of birth control.  Menstrual cycle.  Pregnancy history. Screening  You may have the following tests or measurements:  Height, weight, and BMI.  Blood pressure.  Lipid and cholesterol levels. These may be checked every 5 years, or more frequently if you are over 11 years old.  Skin check.  Lung cancer screening. You may have this screening every year starting at age 13 if you have a 30-pack-year history of smoking and currently smoke or have quit within the past 15 years.  Fecal occult blood test (FOBT) of the stool. You may have this test every year starting at age 40.  Flexible sigmoidoscopy or colonoscopy. You may have a sigmoidoscopy every 5 years or a colonoscopy every 10 years starting at age 37.  Hepatitis C blood test.  Hepatitis B blood test.  Sexually transmitted disease (STD) testing.  Diabetes screening. This is done by checking your blood sugar (glucose) after you have not eaten for a while (fasting). You may have this done every 1-3 years.  Mammogram. This may be done every 1-2 years. Talk to your health care provider about when you should start having regular mammograms. This may depend on whether you have a family history of breast cancer.  BRCA-related cancer screening. This may be done if you have a family history of breast, ovarian,  tubal, or peritoneal cancers.  Pelvic exam and Pap test. This may be done every 3 years starting at age 13. Starting at age 29, this may be done every 5 years if you have a Pap test in combination with an HPV test.  Bone density scan.  This is done to screen for osteoporosis. You may have this scan if you are at high risk for osteoporosis. Discuss your test results, treatment options, and if necessary, the need for more tests with your health care provider. Vaccines  Your health care provider may recommend certain vaccines, such as:  Influenza vaccine. This is recommended every year.  Tetanus, diphtheria, and acellular pertussis (Tdap, Td) vaccine. You may need a Td booster every 10 years.  Zoster vaccine. You may need this after age 28.  Pneumococcal 13-valent conjugate (PCV13) vaccine. You may need this if you have certain conditions and were not previously vaccinated.  Pneumococcal polysaccharide (PPSV23) vaccine. You may need one or two doses if you smoke cigarettes or if you have certain conditions. Talk to your health care provider about which screenings and vaccines you need and how often you need them. This information is not intended to replace advice given to you by your health care provider. Make sure you discuss any questions you have with your health care provider. Document Released: 09/09/2015 Document Revised: 05/02/2016 Document Reviewed: 06/14/2015 Elsevier Interactive Patient Education  2017 [employer] Prevention in the Home Falls can cause injuries. They can happen to people of all ages. There are many things you can do to make your home safe and to help prevent falls. What can I do on the outside of my home?  Regularly fix the edges of walkways and driveways and fix any cracks.  Remove anything that might make you trip as you walk through a door, such as a raised step or threshold.  Trim any bushes or trees on the path to your home.  Use bright outdoor lighting.  Clear any walking paths of anything that might make someone trip, such as rocks or tools.  Regularly check to see if handrails are loose or broken. Make sure that both sides of any steps have handrails.  Any raised decks  and porches should have guardrails on the edges.  Have any leaves, snow, or ice cleared regularly.  Use sand or salt on walking paths during winter.  Clean up any spills in your garage right away. This includes oil or grease spills. What can I do in the bathroom?  Use night lights.  Install grab bars by the toilet and in the tub and shower. Do not use towel bars as grab bars.  Use non-skid mats or decals in the tub or shower.  If you need to sit down in the shower, use a plastic, non-slip stool.  Keep the floor dry. Clean up any water that spills on the floor as soon as it happens.  Remove soap buildup in the tub or shower regularly.  Attach bath mats securely with double-sided non-slip rug tape.  Do not have throw rugs and other things on the floor that can make you trip. What can I do in the bedroom?  Use night lights.  Make sure that you have a light by your bed that is easy to reach.  Do not use any sheets or blankets that are too big for your bed. They should not hang down onto the floor.  Have a firm chair that has side arms.  You can use this for support while you get dressed.  Do not have throw rugs and other things on the floor that can make you trip. What can I do in the kitchen?  Clean up any spills right away.  Avoid walking on wet floors.  Keep items that you use a lot in easy-to-reach places.  If you need to reach something above you, use a strong step stool that has a grab bar.  Keep electrical cords out of the way.  Do not use floor polish or wax that makes floors slippery. If you must use wax, use non-skid floor wax.  Do not have throw rugs and other things on the floor that can make you trip. What can I do with my stairs?  Do not leave any items on the stairs.  Make sure that there are handrails on both sides of the stairs and use them. Fix handrails that are broken or loose. Make sure that handrails are as long as the stairways.  Check any  carpeting to make sure that it is firmly attached to the stairs. Fix any carpet that is loose or worn.  Avoid having throw rugs at the top or bottom of the stairs. If you do have throw rugs, attach them to the floor with carpet tape.  Make sure that you have a light switch at the top of the stairs and the bottom of the stairs. If you do not have them, ask someone to add them for you. What else can I do to help prevent falls?  Wear shoes that:  Do not have high heels.  Have rubber bottoms.  Are comfortable and fit you well.  Are closed at the toe. Do not wear sandals.  If you use a stepladder:  Make sure that it is fully opened. Do not climb a closed stepladder.  Make sure that both sides of the stepladder are locked into place.  Ask someone to hold it for you, if possible.  Clearly mark and make sure that you can see:  Any grab bars or handrails.  First and last steps.  Where the edge of each step is.  Use tools that help you move around (mobility aids) if they are needed. These include:  Canes.  Walkers.  Scooters.  Crutches.  Turn on the lights when you go into a dark area. Replace any light bulbs as soon as they burn out.  Set up your furniture so you have a clear path. Avoid moving your furniture around.  If any of your floors are uneven, fix them.  If there are any pets around you, be aware of where they are.  Review your medicines with your doctor. Some medicines can make you feel dizzy. This can increase your chance of falling. Ask your doctor what other things that you can do to help prevent falls. This information is not intended to replace advice given to you by your health care provider. Make sure you discuss any questions you have with your health care provider. Document Released: 06/09/2009 Document Revised: 01/19/2016 Document Reviewed: 09/17/2014 Elsevier Interactive Patient Education  2017 [employer].

## 2020-09-01 ENCOUNTER — Telehealth: Payer: Self-pay

## 2020-09-01 NOTE — Telephone Encounter (Signed)
    MA1/01/2021 1st Attempt  Name: Monica Mccoy   MRN: [id number]   DOB: [birth year]   AGE: 55 y.o.   GENDER: female   PCP Nche, Bonna Gains, NP.   Referral Reason: Food Insecurity and Financial Difficulties related to housing cost  Interventions: Unsuccessful outbound call placed to the patient. A HIPAA compliant voice message was left requesting a return call.  Follow up plan: Care guide will follow up with patient by phone over the next 7days   Jailee Jaquez, AAS Paralegal, [facility] Care Guide . Embedded Care Coordination [facility] Health  Care Management  [address] [place], [place] [address] [email or web address]  [phone]   [email or web address]

## 2020-09-02 ENCOUNTER — Telehealth: Payer: Self-pay

## 2020-09-02 NOTE — Telephone Encounter (Signed)
    MA1/02/2021  Name: Monica Mccoy   MRN: [id number]   DOB: 16-Mar-1966   AGE: 55 y.o.   GENDER: female   PCP Nche, Charlene Brooke, NP.   Referral Reason: Food Insecurity and Financial Difficulties related to utility assistance  Interventions: Successful outbound call placed to the patient Patient provided with information about care guide support team and interviewed to confirm resource needs Discussed resources to assist with food and utilities  Follow up plan: Care guide will follow up with patient by phone over the next 5 days     Starkisha Tullis, AAS Paralegal, [place] . Embedded Care Coordination [facility] Health  Care Management  [address], [place] [address] [email or web address]  [phone]   [email or web address]

## 2020-09-05 ENCOUNTER — Ambulatory Visit: Payer: Medicare Other | Admitting: Podiatry

## 2020-09-09 ENCOUNTER — Ambulatory Visit (INDEPENDENT_AMBULATORY_CARE_PROVIDER_SITE_OTHER): Payer: Medicare Other | Admitting: Family Medicine

## 2020-09-09 ENCOUNTER — Other Ambulatory Visit: Payer: Self-pay

## 2020-09-09 ENCOUNTER — Encounter: Payer: Self-pay | Admitting: Family Medicine

## 2020-09-09 ENCOUNTER — Telehealth: Payer: Self-pay

## 2020-09-09 VITALS — BP 118/80 | HR 71 | Temp 97.5°F | Ht 65.75 in | Wt 294.2 lb

## 2020-09-09 DIAGNOSIS — G40909 Epilepsy, unspecified, not intractable, without status epilepticus: Secondary | ICD-10-CM | POA: Diagnosis not present

## 2020-09-09 DIAGNOSIS — F331 Major depressive disorder, recurrent, moderate: Secondary | ICD-10-CM | POA: Diagnosis not present

## 2020-09-09 DIAGNOSIS — C50919 Malignant neoplasm of unspecified site of unspecified female breast: Secondary | ICD-10-CM

## 2020-09-09 MED ORDER — DULOXETINE HCL 60 MG PO CPEP: 60.0000 mg | ORAL_CAPSULE | Freq: Every day | ORAL | 1 refills | Status: DC

## 2020-09-09 MED ORDER — LEVETIRACETAM 1000 MG PO TABS: 1000.0000 mg | ORAL_TABLET | Freq: Two times a day (BID) | ORAL | 1 refills | Status: DC

## 2020-09-09 NOTE — Patient Instructions (Signed)
Dr. Consuelo Pandy - podiatry - [facility]

## 2020-09-09 NOTE — Progress Notes (Signed)
Monica Mccoy is a 55 y.o. female  Chief Complaint  Patient presents with  . Establish Care    TOC-  1 month f/u  toe nails.  Patient reports falling down 08/30/20 and is having RT shoulder, arm and knee pain.  She has been taking Tylenol with little relief.   Wants flu shot.     HPI: *Monica Mccoy is a 55 y.o. female seen today for TOC appt, previously seeing my colleague Wilfred Lacy. she is accompanied by her sister [name]. Pt moved to [place] from [place] in 02/2020. She is living with her mother, Myrtie Leuthold, and her son. Her sister is the primary caretaker for both the patient and their mother. She has 3 adult children.  Pt has a pretty extensive medical history including  1. 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 She underwent chemo in early 2014 and radiation in 2014. In 12/2013 she had craniotomy and whole brain radiation in 02/2014. She was treated at [facility].  She has not been seen by oncology since moving to [place], last appt was in [place] in 01/2020.  2. Seizures - 09/2017 - witnessed by her son. On keppra 1000mg  BID. Pt  ran out 4-5 mo ago. No seizures in past few months. She was also on trazodone 100mg  qHS, but it is unclear when this was stopped. She was following with neuro in [place]. Last CT head in 10/2019, CT chest and CT cervical spine also done in 10/2019. Has not established with neuro in [place].  3. Depression - not new. Per sister, was treated in the past for depression but not sure name(s) of meds. She was started on cymbalta 30mg  daily by my colleague Wilfred Lacy in 06/2020. She does not feel it has been very effective. No side effects.   4. Podiatry - seen by Dr. Consuelo Pandy and also Dr. Paulla Dolly. She has Rt foot pain but has not scheduled f/u. Sister states she call to schedule.   5. Pt has multiple other issues/concerns - Rt shoulder/arm pain, knee pain, hands "locking  up". She will RTO in 3 wks to f/u on depression and address these issues.  Records from [facility] and [facility] available in Epic under Media tab. I personally reviewed them today ahead of this appt.   Past Medical History:  Diagnosis Date  . Anxiety   . Breast cancer metastasized to brain [facility])    Left breast  . Chronic pain after cancer treatment   . Depression   . GERD (gastroesophageal reflux disease)   . History of cancer chemotherapy    Breast cancer left breast  . Seizure [facility])     Past Surgical History:  Procedure Laterality Date  . BRAIN SURGERY     brain tumor  . BREAST SURGERY      Social History   Socioeconomic History  . Marital status: Single    Spouse name: Not on file  . Number of children: Not on file  . Years of education: Not on file  . Highest education level: Not on file  Occupational History  . Not on file  Tobacco Use  . Smoking status: Never Smoker  . Smokeless tobacco: Never Used  Vaping Use  . Vaping Use: Never used  Substance and Sexual Activity  . Alcohol use: Yes    Comment: On occasion  . Drug use: Never  .  Sexual activity: Not Currently  Other Topics Concern  . Not on file  Social History Narrative  . Not on file   Social Determinants of Health   Financial Resource Strain: Medium Risk  . Difficulty of Paying Living Expenses: Somewhat hard  Food Insecurity: Food Insecurity Present  . Worried About Programme researcher, broadcasting/film/video in the Last Year: Sometimes true  . Ran Out of Food in the Last Year: Never true  Transportation Needs: No Transportation Needs  . Lack of Transportation (Medical): No  . Lack of Transportation (Non-Medical): No  Physical Activity: Insufficiently Active  . Days of Exercise per Week: 1 day  . Minutes of Exercise per Session: 30 min  Stress: No Stress Concern Present  . Feeling of Stress : Only a little  Social Connections: Socially Isolated  . Frequency of Communication with Friends and Family: More than  three times a week  . Frequency of Social Gatherings with Friends and Family: Once a week  . Attends Religious Services: Never  . Active Member of Clubs or Organizations: No  . Attends Banker Meetings: Never  . Marital Status: Never married  Intimate Partner Violence: Not At Risk  . Fear of Current or Ex-Partner: No  . Emotionally Abused: No  . Physically Abused: No  . Sexually Abused: No    No family history on file.   Immunization History  Administered Date(s) Administered  . Moderna Sars-Covid-2 Vaccination 12/19/2019, 01/19/2020    Outpatient Encounter Medications as of 09/09/2020  Medication Sig  . clotrimazole-betamethasone (LOTRISONE) cream Apply to both feet and between toes bid x 6 weeks.  . DULoxetine (CYMBALTA) 30 MG capsule TAKE 1 CAPSULE BY MOUTH EVERY DAY  . gabapentin (NEURONTIN) 300 MG capsule Take 1cap at HS x 3days, then 1cap BID x7days, then 1cal TID continuously  . pantoprazole (PROTONIX) 40 MG tablet Take 1 tablet (40 mg total) by mouth daily.   No facility-administered encounter medications on file as of 09/09/2020.     ROS: Pertinent positives and negatives noted in HPI. Remainder of ROS non-contributory  Allergies  Allergen Reactions  . Penicillins   . Tramadol     BP 118/80   Pulse 71   Temp (!) 97.5 F (36.4 C) (Temporal)   Ht 5' 5.75" (1.67 m)   Wt 294 lb 3.2 oz (133.4 kg)   SpO2 94%   BMI 47.85 kg/m   Physical Exam Constitutional:      General: She is not in acute distress.    Appearance: She is obese.  Musculoskeletal:     Right lower leg: No edema.     Left lower leg: No edema.  Neurological:     General: No focal deficit present.     Mental Status: She is alert.  Psychiatric:        Speech: Speech is delayed.        Behavior: Behavior normal. Behavior is cooperative.        Thought Content: Thought content normal.        Cognition and Memory: Memory is impaired.     A/P:  1. Metastatic breast cancer  (HCC) - most of history obtained from pts records scanned into Epic from [facility]. Pt and sister are overall poor historians - nvasive ductal carcinoma ER neg, PR neg, HER 2 positive - (dx in 2013, B/L mastectomy in 07/2012) with mets to brain, skull (2015). She underwent chemo in early 2014 and radiation in 2014. In 12/2013 she  had craniotomy and whole brain radiation in 02/2014. - has not seen oncology since she moved to [place] 6 mo ago, last oncology visit 7 mo ago - Ambulatory referral to Oncology - Ambulatory referral to Neurology  2. Seizure disorder ([place]) - likely secondary to brain mets from breast cancer or side effect from craniotomy and whole head radiation - out of med x 3-4 mo Restart - levETIRAcetam (KEPPRA) 1000 MG tablet; Take 1 tablet (1,000 mg total) by mouth 2 (two) times daily.  Dispense: 180 tablet; Refill: 1 - check keppra level at f/u appt in 3 wks - Ambulatory referral to Neurology  3. Moderate episode of recurrent major depressive disorder ([place]) - not well controlled - pt was referred for counseling by my colleague Wilfred Lacy but has not followed thru with this - unsure of what other meds pt has tried in the past Increase: - DULoxetine (CYMBALTA) 60 MG capsule; Take 1 capsule (60 mg total) by mouth daily.  Dispense: 90 capsule; Refill: 1 - increased from 30mg  daily to 60mg  daily.  - f/u in 3 wks or sooner PRN   I spent 42 min with the patient and her sister today obtaining medical history, HPI, reviewing meds and previous records, discussing plan of care and plan for further eval.   This visit occurred during the SARS-CoV-2 public health emergency.  Safety protocols were in place, including screening questions prior to the visit, additional usage of staff PPE, and extensive cleaning of exam room while observing appropriate contact time as indicated for disinfecting solutions.

## 2020-09-09 NOTE — Telephone Encounter (Signed)
    MA1/14/2022 1st Attempt  Name: Monica Mccoy   MRN: [id number]   DOB: Jul 23, 1966   AGE: 55 y.o.   GENDER: female   PCP [place], Charlene Brooke, NP.   Unsuccessful outbound call made today to assist with:  Food Insecurity and rent assistance  Outreach Attempt:  1st Attempt  A HIPAA compliant voice message was left requesting a return call.  Instructed patient to call back at [phone]. This is the first follow-up call attempt. Left message on sister's/[name] Watson, AAS Paralegal, [place] . Embedded Care Coordination [facility]  Care Management  [address], [place] [address] [email or web address]  [phone]   [email or web address]

## 2020-09-15 ENCOUNTER — Telehealth: Payer: Self-pay

## 2020-09-15 NOTE — Telephone Encounter (Signed)
   Telephone encounter was:  Unsuccessful.  09/15/2020 Name: Monica Mccoy MRN: [id number] DOB: [birth year]  Unsuccessful outbound call made today to assist with:  Food Insecurity and utility and rent  Outreach Attempt:  2nd Attempt  A HIPAA compliant voice message was left requesting a return call.  Instructed patient to call back at [phone]  Milicent Adams, AAS Paralegal, [place] . Embedded Care Coordination [facility]  Care Management  [address], [place] [address] ??[email or web address]  ?? [phone]   [email or web address]

## 2020-09-20 ENCOUNTER — Telehealth: Payer: Self-pay | Admitting: Hematology and Oncology

## 2020-09-20 ENCOUNTER — Telehealth: Payer: Self-pay

## 2020-09-20 NOTE — Telephone Encounter (Signed)
Received a new pt referral from Dr. Bryan Lemma for breast cancer. Monica Mccoy has been scheduled to see Dr. Lindi Adie on 1/27 at 1pm. Appt date and time has been given to the pt's daughter.

## 2020-09-20 NOTE — Telephone Encounter (Signed)
   Telephone encounter was:  Successful.  09/20/2020 Name: Aoife Bold MRN: [id number] DOB: November 23, 1965  Moncia Annas is a 55 y.o. year old female who is a primary care patient of Ronnald Nian, DO . The community resource team was consulted for assistance with Food Insecurity and housing cost  Care guide performed the following interventions: Follow up call placed to the patient to discuss status of referral.  Follow Up Plan:  Spoke with client's sister Benard Rink. She was driving and unable to talk. She will call me back tomorrow.  Taren Toops, AAS Paralegal, [place] . Embedded Care Coordination [facility]  Care Management  [address], [place] [address] ??[email or web address]  ?? [phone]   [email or web address]

## 2020-09-21 NOTE — Progress Notes (Signed)
[place] CONSULT NOTE  Patient Care Team: Ronnald Nian, DO as PCP - General (Family Medicine) Pcp, No as PCP - Family Medicine  CHIEF COMPLAINTS/PURPOSE OF CONSULTATION:  History of metastatic breast cancer  HISTORY OF PRESENTING ILLNESS:  Monica Mccoy 55 y.o. female is here because of a history of metastatic breast cancer. She had a history of boils in her right axilla and palpated right axillary adenopathy from one year from 2012-2013. When it increased in size she saw her PCP in 05/2012. Screening mammogram on 06/13/12 showed segmental microcalcifications in the upper outer right breast and a mass in the right axilla. US of the right axilla showed two adjacent enlarged lymph nodes, 5.3cm and 3.5cm. Biopsy on 06/23/12 showed in the right axilla, invasive ductal carcinoma, grade 3, HER-2 positive (3+), ER/PR negative, Ki67 5-10%, and in the right breast, high grade DCIS. BRCA gene testing was negative. Left axilla [place] on 07/31/12 showed lymphadenopathy spanning 2.2cm. She underwent bilateral mastectomies on 08/06/12 for which pathology showed in the right breast, invasive ductal carcinoma, 0.7cm, grade 2, with extensive DCIS, clear margins, 8/11 lymph nodes positive, measuring up to 3.8cm, and in the left breast, no evidence of malignancy and 6 lymph nodes negative for carcinoma. She underwent adjuvant chemotherapy with 4 cycles of dose dense Adriamycin and Cytoxan followed by 4 cycles of Taxol Herceptin and then Herceptin maintenance. She underwent right chest radiation.  On 12/24/13 she was seen for dizziness, headaches, ataxia, and memory loss. Head CT showed a 1.8cm and a 2.0cm frontal lobe mass. She underwent resection of both masses on 01/07/14, for which pathology showed moderately differentiated carcinoma, likely of breast origin, HER-2 positive, ER/PR negative, CK7 positive. She underwent whole brain radiation and began treatment with Taxotere, Herceptin and Perjeta, but  Taxotere was discontinued after 4 cycles. She moved to [facility] from [place], where she received care at the [facility], in 02/2020 to live with her parents and son near her sister. She was last seen by an oncologist in 01/2020, and was referred by her PCP Dr. Bryan Lemma. She presents to the clinic today for initial evaluation.  I reviewed her records extensively and collaborated the history with the patient.  SUMMARY OF ONCOLOGIC HISTORY: Oncology History  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[place])  06/13/2012 Initial Diagnosis   Right breast biopsy: Invasive ductal carcinoma, grade 3, HER-2 positive (3+), ER/PR negative, Ki67 5-10%, and in the right breast, high grade DCIS. BRCA gene testing was negative   08/06/2012 Surgery   Bilateral mastectomies:  Right breast: Invasive ductal carcinoma, 0.7cm, grade 2, with extensive DCIS, clear margins, 8/11 lymph nodes positive, measuring up to 3.8cm,  Left breast, no evidence of malignancy and 6 lymph nodes negative for carcinoma.    2014 - 2015 Chemotherapy   adjuvant chemotherapy with 4 cycles of dose dense Adriamycin and Cytoxan followed by 4 cycles of Taxol Herceptin and then Herceptin maintenance. She underwent right chest radiation.       12/24/2013 Relapse/Recurrence   Dizziness, headaches, ataxia, and memory loss. Head CT showed a 1.8cm and a 2.0cm frontal lobe masses. She underwent resection of both masses on 01/07/14,: Moderately differentiated carcinoma, likely of breast origin, HER-2 positive, ER/PR negative, CK7 positive.   2015 - 2015 Radiation Therapy   Received whole brain radiation    Chemotherapy   Taxotere, Herceptin and Perjeta, but Taxotere was discontinued after 4 cycles         MEDICAL HISTORY:  Past Medical History:  Diagnosis Date  . Anxiety   . Breast cancer metastasized to brain [facility])    Left breast  . Chronic pain after cancer treatment   . Depression   . GERD  (gastroesophageal reflux disease)   . History of cancer chemotherapy    Breast cancer left breast  . Seizure ([place])     SURGICAL HISTORY: Past Surgical History:  Procedure Laterality Date  . BRAIN SURGERY     brain tumor  . BREAST SURGERY      SOCIAL HISTORY: Social History   Socioeconomic History  . Marital status: Single    Spouse name: Not on file  . Number of children: Not on file  . Years of education: Not on file  . Highest education level: Not on file  Occupational History  . Not on file  Tobacco Use  . Smoking status: Never Smoker  . Smokeless tobacco: Never Used  Vaping Use  . Vaping Use: Never used  Substance and Sexual Activity  . Alcohol use: Yes    Comment: On occasion  . Drug use: Never  . Sexual activity: Not Currently  Other Topics Concern  . Not on file  Social History Narrative  . Not on file   Social Determinants of Health   Financial Resource Strain: Medium Risk  . Difficulty of Paying Living Expenses: Somewhat hard  Food Insecurity: Food Insecurity Present  . Worried About Charity fundraiser in the Last Year: Sometimes true  . Ran Out of Food in the Last Year: Never true  Transportation Needs: No Transportation Needs  . Lack of Transportation (Medical): No  . Lack of Transportation (Non-Medical): No  Physical Activity: Insufficiently Active  . Days of Exercise per Week: 1 day  . Minutes of Exercise per Session: 30 min  Stress: No Stress Concern Present  . Feeling of Stress : Only a little  Social Connections: Socially Isolated  . Frequency of Communication with Friends and Family: More than three times a week  . Frequency of Social Gatherings with Friends and Family: Once a week  . Attends Religious Services: Never  . Active Member of Clubs or Organizations: No  . Attends Archivist Meetings: Never  . Marital Status: Never married  Intimate Partner Violence: Not At Risk  . Fear of Current or Ex-Partner: No  . Emotionally  Abused: No  . Physically Abused: No  . Sexually Abused: No    FAMILY HISTORY: No family history on file.  ALLERGIES:  is allergic to penicillins and tramadol.  MEDICATIONS:  Current Outpatient Medications  Medication Sig Dispense Refill  . traMADol (ULTRAM) 50 MG tablet Take 1 tablet (50 mg total) by mouth every 12 (twelve) hours as needed. 60 tablet 0  . clotrimazole-betamethasone (LOTRISONE) cream Apply to both feet and between toes bid x 6 weeks. 45 g 1  . DULoxetine (CYMBALTA) 60 MG capsule Take 1 capsule (60 mg total) by mouth daily. 90 capsule 1  . levETIRAcetam (KEPPRA) 1000 MG tablet Take 1 tablet (1,000 mg total) by mouth 2 (two) times daily. 180 tablet 1  . pantoprazole (PROTONIX) 40 MG tablet Take 1 tablet (40 mg total) by mouth daily. 90 tablet 0   No current facility-administered medications for this visit.    REVIEW OF SYSTEMS:   Severe intermittent bilateral chest discomfort, recurrent headaches, recurrent syncope, weight loss, loss of appetite, leg pains, generalized aches and pains   all other systems were reviewed with the patient and  are negative.  PHYSICAL EXAMINATION: ECOG PERFORMANCE STATUS: 2 - Symptomatic, <50% confined to bed  Vitals:   09/22/20 1307  BP: (!) 134/91  Pulse: 61  Resp: 17  Temp: (!) 97.5 F (36.4 C)  SpO2: 98%   Filed Weights   09/22/20 1307  Weight: 281 lb 12.8 oz (127.8 kg)    Chest: Bilateral mastectomies with skin) no palpable lumps or nodules  LABORATORY DATA:  I have reviewed the data as listed Lab Results  Component Value Date   WBC 2.7 02/15/2020   HGB 11.9 (A) 02/15/2020   HCT 37 02/15/2020   PLT 175 02/15/2020   Lab Results  Component Value Date   NA 139 12/15/2019   K 4.7 12/15/2019   CL 104 12/15/2019   CO2 28 (A) 12/15/2019    RADIOGRAPHIC STUDIES: I have personally reviewed the radiological reports and agreed with the findings in the report.  ASSESSMENT AND PLAN:  Cancer of right breast metastatic  to brain [facility]) Moved to [facility] from [place], [facility]), in 02/2020 to live with her parents and son near her sister.  Prior treatment: Patient was on Herceptin and Perjeta maintenance although until June 2021 Because of insurance issues she has not had treatment in the last 7 months.  1. Severe recurrent headaches: Could be related to prior brain radiation.  We will need to get a brain MRI with and without contrast 2. Severe bilateral chest discomfort without any palpable lesions: I would like to obtain CT chest abdomen pelvis for restaging for metastatic breast cancer 3.  We will request Dr. Mickeal Skinner to see the patient regarding her neurological symptoms.  Return to clinic in 2 weeks for follow-up on the scans. There is no evidence of systemic metastatic disease, we may continue with Herceptin maintenance or Herceptin Perjeta maintenance.     All questions were answered. The patient knows to call the clinic with any problems, questions or concerns.   Rulon Eisenmenger, MD, MPH 09/22/2020    I, Molly Dorshimer, am acting as scribe for Nicholas Lose, MD.  I have reviewed the above documentation for accuracy and completeness, and I agree with the above.

## 2020-09-22 ENCOUNTER — Other Ambulatory Visit: Payer: Self-pay

## 2020-09-22 ENCOUNTER — Ambulatory Visit (HOSPITAL_COMMUNITY)
Admission: RE | Admit: 2020-09-22 | Discharge: 2020-09-22 | Disposition: A | Discharge status: Home / Self Care | Payer: Medicare Other | Source: Ambulatory Visit | Attending: Hematology and Oncology | Admitting: Hematology and Oncology

## 2020-09-22 ENCOUNTER — Inpatient Hospital Stay: Payer: Medicare Other | Attending: Hematology and Oncology | Admitting: Hematology and Oncology

## 2020-09-22 DIAGNOSIS — C7931 Secondary malignant neoplasm of brain: Secondary | ICD-10-CM | POA: Diagnosis present

## 2020-09-22 DIAGNOSIS — F418 Other specified anxiety disorders: Secondary | ICD-10-CM | POA: Insufficient documentation

## 2020-09-22 DIAGNOSIS — Z452 Encounter for adjustment and management of vascular access device: Secondary | ICD-10-CM | POA: Diagnosis not present

## 2020-09-22 DIAGNOSIS — Z171 Estrogen receptor negative status [ER-]: Secondary | ICD-10-CM | POA: Insufficient documentation

## 2020-09-22 DIAGNOSIS — Z923 Personal history of irradiation: Secondary | ICD-10-CM | POA: Diagnosis not present

## 2020-09-22 DIAGNOSIS — Z9013 Acquired absence of bilateral breasts and nipples: Secondary | ICD-10-CM | POA: Diagnosis not present

## 2020-09-22 DIAGNOSIS — Z79899 Other long term (current) drug therapy: Secondary | ICD-10-CM | POA: Insufficient documentation

## 2020-09-22 DIAGNOSIS — R519 Headache, unspecified: Secondary | ICD-10-CM | POA: Diagnosis not present

## 2020-09-22 DIAGNOSIS — C50911 Malignant neoplasm of unspecified site of right female breast: Secondary | ICD-10-CM | POA: Insufficient documentation

## 2020-09-22 DIAGNOSIS — C50411 Malignant neoplasm of upper-outer quadrant of right female breast: Secondary | ICD-10-CM | POA: Diagnosis present

## 2020-09-22 DIAGNOSIS — C50919 Malignant neoplasm of unspecified site of unspecified female breast: Secondary | ICD-10-CM | POA: Insufficient documentation

## 2020-09-22 DIAGNOSIS — Z9221 Personal history of antineoplastic chemotherapy: Secondary | ICD-10-CM | POA: Insufficient documentation

## 2020-09-22 IMAGING — DX DG CHEST 1V
1 series · 1 of 1 positions shown · non-contrast
Comparison: None.

CLINICAL DATA: Confirm port placement. Right-sided breast cancer
with brain metastasis.

EXAM:
CHEST  1 VIEW

[chest pa]
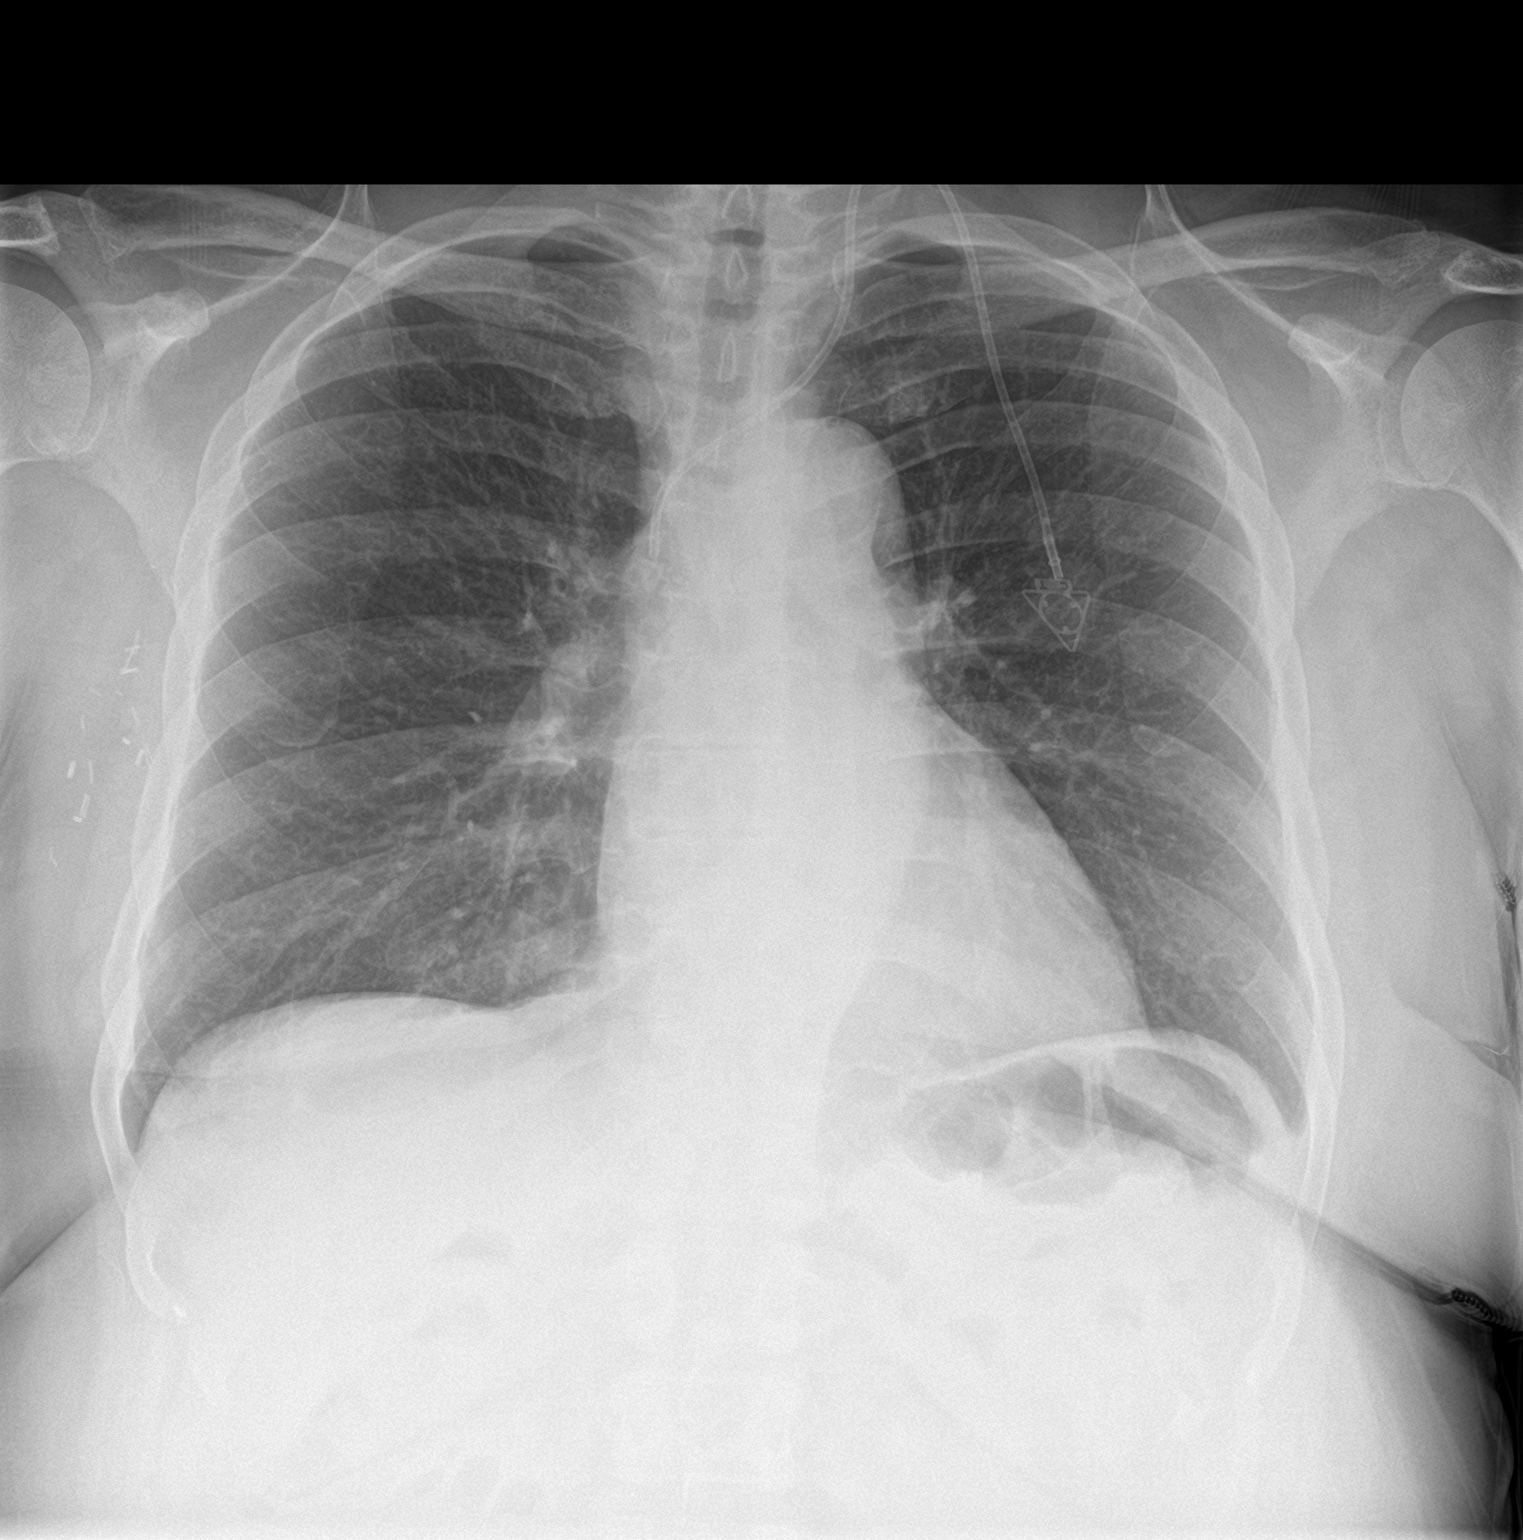

[1 of 1 positions shown; findings below may reference images not displayed]

FINDINGS: Left Port-A-Cath tip at mid SVC.

Right axillary node dissection. Midline trachea. Normal heart size.
Atherosclerosis in the transverse aorta. No pleural effusion or
pneumothorax. Clear lungs.
IMPRESSION: Left Port-A-Cath tip at mid SVC, without pneumothorax.

## 2020-09-22 MED ORDER — TRAMADOL HCL 50 MG PO TABS: 50.0000 mg | ORAL_TABLET | Freq: Two times a day (BID) | ORAL | 0 refills | Status: DC | PRN

## 2020-09-22 NOTE — Assessment & Plan Note (Signed)
Moved to [place] from [place], [facility]), in 02/2020 to live with her parents and son near her sister.  Current treatment:

## 2020-09-23 ENCOUNTER — Inpatient Hospital Stay: Payer: Medicare Other

## 2020-09-23 ENCOUNTER — Telehealth: Payer: Self-pay

## 2020-09-23 ENCOUNTER — Other Ambulatory Visit: Payer: Self-pay

## 2020-09-23 DIAGNOSIS — Z452 Encounter for adjustment and management of vascular access device: Secondary | ICD-10-CM | POA: Diagnosis not present

## 2020-09-23 DIAGNOSIS — Z95828 Presence of other vascular implants and grafts: Secondary | ICD-10-CM

## 2020-09-23 DIAGNOSIS — C50911 Malignant neoplasm of unspecified site of right female breast: Secondary | ICD-10-CM

## 2020-09-23 DIAGNOSIS — C7931 Secondary malignant neoplasm of brain: Secondary | ICD-10-CM

## 2020-09-23 LAB — CBC WITH DIFFERENTIAL (CANCER CENTER ONLY)
Abs Immature Granulocytes: 0.01 10*3/uL (ref 0.00–0.07)
Basophils Absolute: 0 10*3/uL (ref 0.0–0.1)
Basophils Relative: 1 %
Eosinophils Absolute: 0.1 10*3/uL (ref 0.0–0.5)
Eosinophils Relative: 3 %
HCT: 38.5 % (ref 36.0–46.0)
Hemoglobin: 12.7 g/dL (ref 12.0–15.0)
Immature Granulocytes: 0 %
Lymphocytes Relative: 28 %
Lymphs Abs: 1.1 10*3/uL (ref 0.7–4.0)
MCH: 30.2 pg (ref 26.0–34.0)
MCHC: 33 g/dL (ref 30.0–36.0)
MCV: 91.4 fL (ref 80.0–100.0)
Monocytes Absolute: 0.5 10*3/uL (ref 0.1–1.0)
Monocytes Relative: 13 %
Neutro Abs: 2 10*3/uL (ref 1.7–7.7)
Neutrophils Relative %: 55 %
Platelet Count: 175 10*3/uL (ref 150–400)
RBC: 4.21 MIL/uL (ref 3.87–5.11)
RDW: 12.6 % (ref 11.5–15.5)
WBC Count: 3.7 10*3/uL — ABNORMAL LOW (ref 4.0–10.5)
nRBC: 0 % (ref 0.0–0.2)

## 2020-09-23 LAB — CMP (CANCER CENTER ONLY)
ALT: <6 U/L (ref 0–44)
AST: 10 U/L — ABNORMAL LOW (ref 15–41)
Albumin: 3.8 g/dL (ref 3.5–5.0)
Alkaline Phosphatase: 85 U/L (ref 38–126)
Anion gap: 8 (ref 5–15)
BUN: 11 mg/dL (ref 6–20)
CO2: 27 mmol/L (ref 22–32)
Calcium: 9 mg/dL (ref 8.9–10.3)
Chloride: 103 mmol/L (ref 98–111)
Creatinine: 1.02 mg/dL — ABNORMAL HIGH (ref 0.44–1.00)
GFR, Estimated: >60 mL/min (ref >60)
Glucose, Bld: 156 mg/dL — ABNORMAL HIGH (ref 70–99)
Potassium: 3.4 mmol/L — ABNORMAL LOW (ref 3.5–5.1)
Sodium: 138 mmol/L (ref 135–145)
Total Bilirubin: 0.7 mg/dL (ref 0.3–1.2)
Total Protein: 7.3 g/dL (ref 6.5–8.1)

## 2020-09-23 MED ORDER — HEPARIN SOD (PORK) LOCK FLUSH 100 UNIT/ML IV SOLN
500.0000 [IU] | Freq: Once | INTRAVENOUS | Status: AC
  Administered 2020-09-23: 500 [IU] via INTRAVENOUS
  Filled 2020-09-23: qty 5

## 2020-09-23 MED ORDER — SODIUM CHLORIDE 0.9% FLUSH
10.0000 mL | Freq: Once | INTRAVENOUS | Status: AC
  Administered 2020-09-23: 10 mL via INTRAVENOUS
  Filled 2020-09-23: qty 10

## 2020-09-23 NOTE — Patient Instructions (Signed)

## 2020-09-23 NOTE — Telephone Encounter (Signed)
   Telephone encounter was:  Successful.  09/23/2020 Name: Monica Mccoy MRN: [id number] DOB: 1965-09-06  [name] is a 55 y.o. year old female who is a primary care patient of Ronnald Nian, DO . The community resource team was consulted for assistance with Food Insecurity and housing cost  Care guide performed the following interventions: Follow up call placed to the patient to discuss status of referral Emailed information for [place], [place], Physicist, medical.  Follow Up Plan:  Care guide will follow up with patient by phone over the next 7 days  Deklin Bieler, AAS Paralegal, [place] . Embedded Care Coordination [facility] Health  Care Management  [address], [place] [address] ??[email or web address]  ?? [phone]   [email or web address]

## 2020-09-26 ENCOUNTER — Telehealth: Payer: Self-pay

## 2020-09-26 NOTE — Telephone Encounter (Signed)
   Telephone encounter was:  Successful.  09/26/2020 Name: Monica Mccoy MRN: [id number] DOB: [birth year]  Monica Mccoy is a 55 y.o. year old female who is a primary care patient of Ronnald Nian, DO . The community resource team was consulted for assistance with Food Insecurity and housing cost  Care guide performed the following interventions: Follow up call placed to the patient to discuss status of referral Patient's daughter Monica Mccoy was unable to talk will call me back tomorrow..  Follow Up Plan:  Care guide will follow up with patient by phone over the next 5 days  Monica Mccoy, AAS Paralegal, [place] . Embedded Care Coordination [facility] Health  Care Management  [address], [place] [address] ??[email or web address]  ?? [phone]   [email or web address]

## 2020-09-27 ENCOUNTER — Telehealth: Payer: Self-pay

## 2020-09-27 NOTE — Telephone Encounter (Signed)
   Telephone encounter was:  Successful.  09/27/2020 Name: Monica Mccoy MRN: [id number] DOB: 18-Mar-1966  Monica Mccoy is a 55 y.o. year old female who is a primary care patient of Ronnald Nian, DO . The community resource team was consulted for assistance with Food Insecurity and housing cost  Care guide performed the following interventions: Follow up call placed to the patient to discuss status of referral patient's daughter Monica Mccoy stated patient is receiving food stamps and she received the emailed resources for food and assistance with utilities..  Follow Up Plan:  No further follow up planned at this time. The patient has been provided with needed resources.  Monica Mccoy, AAS Paralegal, [place] . Embedded Care Coordination [facility] Health  Care Management  [address], [place] [address] ??[email or web address]  ?? [phone]   [email or web address]

## 2020-09-30 ENCOUNTER — Other Ambulatory Visit: Payer: Self-pay

## 2020-09-30 ENCOUNTER — Ambulatory Visit: Payer: Medicare Other | Admitting: Nurse Practitioner

## 2020-10-05 NOTE — Progress Notes (Incomplete)
HEMATOLOGY-ONCOLOGY MYCHART VIDEO VISIT PROGRESS NOTE  I connected with Monica Mccoy on 10/05/2020 at  9:30 AM EST by MyChart video conference and verified that I am speaking with the correct person using two identifiers.  I discussed the limitations, risks, security and privacy concerns of performing an evaluation and management service by MyChart and the availability of in person appointments.  I also discussed with the patient that there may be a patient responsible charge related to this service. The patient expressed understanding and agreed to proceed.  Patient's Location: Home Physician Location: Clinic  CHIEF COMPLIANT: Follow-up of metastatic breast cancer  INTERVAL HISTORY: Monica Mccoy is a 55 y.o. female with above-mentioned history of metastatic breast cancer. She presents over MyChart today for follow-up.   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 BRCA gene testing was negative   08/06/2012 Surgery   Bilateral mastectomies:  Right breast: Invasive ductal carcinoma, 0.7cm, grade 2, with extensive DCIS, clear margins, 8/11 lymph nodes positive, measuring up to 3.8cm,  Left breast, no evidence of malignancy and 6 lymph nodes negative for carcinoma.    2014 - 2015 Chemotherapy   adjuvant chemotherapy with 4 cycles of dose dense Adriamycin and Cytoxan followed by 4 cycles of Taxol Herceptin and then Herceptin maintenance. She underwent right chest radiation.       12/24/2013 Relapse/Recurrence   Dizziness, headaches, ataxia, and memory loss. Head CT showed a 1.8cm and a 2.0cm frontal lobe masses. She underwent resection of both masses on 01/07/14,: Moderately differentiated carcinoma, likely of breast origin, HER-2 positive, ER/PR negative, CK7 positive.   2015 - 2015  Radiation Therapy   Received whole brain radiation    Chemotherapy   Taxotere, Herceptin and Perjeta, but Taxotere was discontinued after 4 cycles         Observations/Objective:  There were no vitals filed for this visit. There is no height or weight on file to calculate BMI.  I have reviewed the data as listed CMP Latest Ref Rng & Units 09/23/2020 12/15/2019  Glucose 70 - 99 mg/dL 156(H) -  BUN 6 - 20 mg/dL 11 10  Creatinine 0.44 - 1.00 mg/dL 1.02(H) 0.8  Sodium 135 - 145 mmol/L 138 139  Potassium 3.5 - 5.1 mmol/L 3.4(L) 4.7  Chloride 98 - 111 mmol/L 103 104  CO2 22 - 32 mmol/L 27 28(A)  Calcium 8.9 - 10.3 mg/dL 9.0 9.3  Total Protein 6.5 - 8.1 g/dL 7.3 -  Total Bilirubin 0.3 - 1.2 mg/dL 0.7 -  Alkaline Phos 38 - 126 U/L 85 82  AST 15 - 41 U/L 10(L) 14  ALT 0 - 44 U/L <6 7    Lab Results  Component Value Date   WBC 3.7 (L) 09/23/2020   HGB 12.7 09/23/2020   HCT 38.5 09/23/2020   MCV 91.4 09/23/2020   PLT 175 09/23/2020   NEUTROABS 2.0 09/23/2020      Assessment Plan:  No problem-specific Assessment & Plan notes found for this encounter.    I discussed the assessment and treatment plan with the patient. The patient was provided an opportunity to ask questions and all were answered. The patient agreed with the plan and demonstrated an understanding of the instructions. The patient was advised to call back or seek an in-person evaluation if the symptoms worsen or  if the condition fails to improve as anticipated.   Total time spent: *** minutes including face-to-face MyChart video visit time and time spent for planning, charting and coordination of care  Rulon Eisenmenger, MD 10/05/2020   I, Cloyde Reams Dorshimer, am acting as scribe for Nicholas Lose, MD.  {insert scribe attestation}

## 2020-10-06 ENCOUNTER — Inpatient Hospital Stay: Payer: Medicare Other | Admitting: Hematology and Oncology

## 2020-10-06 ENCOUNTER — Inpatient Hospital Stay: Payer: Medicare Other | Admitting: Internal Medicine

## 2020-10-06 ENCOUNTER — Other Ambulatory Visit: Payer: Self-pay

## 2020-10-07 ENCOUNTER — Ambulatory Visit (INDEPENDENT_AMBULATORY_CARE_PROVIDER_SITE_OTHER): Payer: Medicare Other | Admitting: Family Medicine

## 2020-10-07 ENCOUNTER — Encounter: Payer: Self-pay | Admitting: Family Medicine

## 2020-10-07 VITALS — BP 130/84 | HR 57 | Temp 97.4°F | Ht 65.75 in | Wt 281.4 lb

## 2020-10-07 DIAGNOSIS — R2689 Other abnormalities of gait and mobility: Secondary | ICD-10-CM | POA: Diagnosis not present

## 2020-10-07 DIAGNOSIS — R296 Repeated falls: Secondary | ICD-10-CM

## 2020-10-07 DIAGNOSIS — F331 Major depressive disorder, recurrent, moderate: Secondary | ICD-10-CM | POA: Diagnosis not present

## 2020-10-07 DIAGNOSIS — C50919 Malignant neoplasm of unspecified site of unspecified female breast: Secondary | ICD-10-CM

## 2020-10-07 NOTE — Progress Notes (Signed)
Monica Mccoy is a 55 y.o. female  Chief Complaint  Patient presents with  . Follow-up    3 week f/u depression.  C/o getting off balance frequently.    Pt arrived 8 min late to appt  HPI: Monica Mccoy is a 55 y.o. female accompanied by her sister and seen today for f/u on depression and to address multiple MSK/joint issues. At last OV with me on 09/09/20, her Cymbalta was increased from 30mg  to 60mg  daily. Today she reports improvement in symptoms. No noted side effects. She is happy with this med and dose.   Pt complains of issues with balance that are not new but worse as per pt. She has fallen multiple times at home. She does not use any assistance device and is not sure she wants to. She is agreeable to home PT. No dizziness. + blurry vision - she has glasses but does not wear them.   Also since last OV, pt has established with oncology Dr. Lindi Adie. CT c/a/p and MRI brain were ordered but have not been done yet. These are scheduled for 10/18/20. Pt was referred to neuro oncologist Dr. Mickeal Skinner d/t neurologic symptoms and h/o brain mets, but this appt was cancelled. That appt has been rescheduled for 2/28. Pt also had appt with Dr. Lindi Adie that day. She has appt with neuro Dr. Leta Baptist on 11/23/20.   Past Medical History:  Diagnosis Date  . Anxiety   . Breast cancer metastasized to brain [facility])    Left breast  . Chronic pain after cancer treatment   . Depression   . GERD (gastroesophageal reflux disease)   . History of cancer chemotherapy    Breast cancer left breast  . Seizure [facility])     Past Surgical History:  Procedure Laterality Date  . BRAIN SURGERY     brain tumor  . BREAST SURGERY      Social History   Socioeconomic History  . Marital status: Single    Spouse name: Not on file  . Number of children: Not on file  . Years of education: Not on file  . Highest education level: Not on file  Occupational History  . Not on file  Tobacco Use  . Smoking status: Never  Smoker  . Smokeless tobacco: Never Used  Vaping Use  . Vaping Use: Never used  Substance and Sexual Activity  . Alcohol use: Yes    Comment: On occasion  . Drug use: Never  . Sexual activity: Not Currently  Other Topics Concern  . Not on file  Social History Narrative  . Not on file   Social Determinants of Health   Financial Resource Strain: Medium Risk  . Difficulty of Paying Living Expenses: Somewhat hard  Food Insecurity: Food Insecurity Present  . Worried About Charity fundraiser in the Last Year: Sometimes true  . Ran Out of Food in the Last Year: Never true  Transportation Needs: No Transportation Needs  . Lack of Transportation (Medical): No  . Lack of Transportation (Non-Medical): No  Physical Activity: Insufficiently Active  . Days of Exercise per Week: 1 day  . Minutes of Exercise per Session: 30 min  Stress: No Stress Concern Present  . Feeling of Stress : Only a little  Social Connections: Socially Isolated  . Frequency of Communication with Friends and Family: More than three times a week  . Frequency of Social Gatherings with Friends and Family: Once a week  . Attends Religious Services: Never  .  Active Member of Clubs or Organizations: No  . Attends Archivist Meetings: Never  . Marital Status: Never married  Intimate Partner Violence: Not At Risk  . Fear of Current or Ex-Partner: No  . Emotionally Abused: No  . Physically Abused: No  . Sexually Abused: No    History reviewed. No pertinent family history.   Immunization History  Administered Date(s) Administered  . Moderna Sars-Covid-2 Vaccination 12/19/2019, 01/19/2020    Outpatient Encounter Medications as of 10/07/2020  Medication Sig  . DULoxetine (CYMBALTA) 60 MG capsule Take 1 capsule (60 mg total) by mouth daily.  Marland Kitchen gabapentin (NEURONTIN) 300 MG capsule Take 300 mg by mouth 3 (three) times daily.  Marland Kitchen levETIRAcetam (KEPPRA) 1000 MG tablet Take 1 tablet (1,000 mg total) by mouth 2  (two) times daily.  . pantoprazole (PROTONIX) 40 MG tablet Take 1 tablet (40 mg total) by mouth daily.  . clotrimazole-betamethasone (LOTRISONE) cream Apply to both feet and between toes bid x 6 weeks. (Patient not taking: Reported on 10/07/2020)  . traMADol (ULTRAM) 50 MG tablet Take 1 tablet (50 mg total) by mouth every 12 (twelve) hours as needed. (Patient not taking: Reported on 10/07/2020)   No facility-administered encounter medications on file as of 10/07/2020.     ROS: Pertinent positives and negatives noted in HPI. Remainder of ROS non-contributory    Allergies  Allergen Reactions  . Penicillins   . Tramadol     BP 130/84   Pulse (!) 57   Temp (!) 97.4 F (36.3 C) (Temporal)   Ht 5' 5.75" (1.67 m)   Wt 281 lb 6.4 oz (127.6 kg)   SpO2 94%   BMI 45.77 kg/m   Wt Readings from Last 3 Encounters:  10/07/20 281 lb 6.4 oz (127.6 kg)  09/22/20 281 lb 12.8 oz (127.8 kg)  09/09/20 294 lb 3.2 oz (133.4 kg)   Temp Readings from Last 3 Encounters:  10/07/20 (!) 97.4 F (36.3 C) (Temporal)  09/22/20 (!) 97.5 F (36.4 C) (Temporal)  09/09/20 (!) 97.5 F (36.4 C) (Temporal)   BP Readings from Last 3 Encounters:  10/07/20 130/84  09/22/20 (!) 134/91  09/09/20 118/80   Pulse Readings from Last 3 Encounters:  10/07/20 (!) 57  09/22/20 61  09/09/20 71     Physical Exam Constitutional:      General: She is not in acute distress.    Appearance: She is obese.  Neurological:     Mental Status: She is alert and oriented to person, place, and time. Mental status is at baseline.     Motor: Weakness (generalized) present.     Coordination: Coordination abnormal.  Psychiatric:        Mood and Affect: Mood normal.     Comments: Speech is slow at time      A/P:  1. Moderate episode of recurrent major depressive disorder (HCC) - symptoms improved on 60mg  daily of cymbalta  - cont current med and dose   2. Metastatic breast cancer ([place]) - follows with Dr. Lindi Adie - due  for CT c/a/p and MRI brain on 2/22 - she has f/u appt with Dr. Lindi Adie and initial appt with Dr. Mickeal Skinner on 2/28 - For home use only DME 4 wheeled rolling walker with seat ([id number])  3. Balance problems 4. Frequent falls - scheduled to see neuro, neuro onc, and has MRI appt scheduled   - referral for [facility] placed - For home use only DME 4 wheeled rolling walker with seat (  [id number])   This visit occurred during the SARS-CoV-2 public health emergency.  Safety protocols were in place, including screening questions prior to the visit, additional usage of staff PPE, and extensive cleaning of exam room while observing appropriate contact time as indicated for disinfecting solutions.

## 2020-10-11 ENCOUNTER — Telehealth: Payer: Self-pay | Admitting: Family Medicine

## 2020-10-11 NOTE — Telephone Encounter (Signed)
[facility] is calling to get orders for OT evaluation and treatment for ADL's and to approve PT Plan of Care with frequency of 2 week 1 2 week 2 and once every other week for 6 weeks. Please call her at [phone].

## 2020-10-12 NOTE — Telephone Encounter (Signed)
Juliann Pulse notified VIA phone that orders for OT  and  PT as directed. Dm/cma

## 2020-10-12 NOTE — Telephone Encounter (Signed)
Ok to give verbal order for OT eval and treatment as requested

## 2020-10-12 NOTE — Telephone Encounter (Signed)
Please advise on message below. Thanks. Dm/cma  

## 2020-10-20 ENCOUNTER — Other Ambulatory Visit: Payer: Self-pay

## 2020-10-20 ENCOUNTER — Ambulatory Visit (HOSPITAL_COMMUNITY)
Admission: RE | Admit: 2020-10-20 | Discharge: 2020-10-20 | Disposition: A | Discharge status: Home / Self Care | Payer: Medicare Other | Source: Ambulatory Visit | Attending: Hematology and Oncology | Admitting: Hematology and Oncology

## 2020-10-20 DIAGNOSIS — C50911 Malignant neoplasm of unspecified site of right female breast: Secondary | ICD-10-CM | POA: Diagnosis not present

## 2020-10-20 DIAGNOSIS — C7931 Secondary malignant neoplasm of brain: Secondary | ICD-10-CM | POA: Diagnosis present

## 2020-10-20 IMAGING — MR MR HEAD WO/W CM
12 series · 48 of 48 positions shown · IV contrast (gadavist)
Comparison: None.

CLINICAL DATA: Syncope, breast cancer

EXAM:
MRI HEAD WITHOUT AND WITH CONTRAST
TECHNIQUE: Multiplanar, multiecho pulse sequences of the brain and surrounding
structures were obtained without and with intravenous contrast.
CONTRAST:  10mL GADAVIST GADOBUTROL 1 MMOL/ML IV SOLN

[Series 5: T1 · sagittal · 3.0mm · 0.75mm/px · 1 of 39 slices shown (1 of 2)]
[im 1/39]
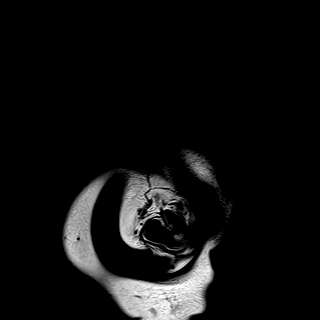

[Series 6: DWI · axial · 3.0mm · 2.09mm/px · z∈[-29,+127]mm · 6 of 108 slices shown (1 of 2)]
[im 1/108]
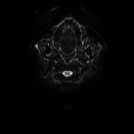
[im 22/108]
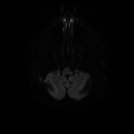
[im 43/108]
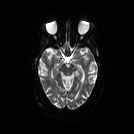
[im 65/108]
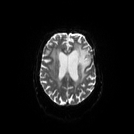
[im 86/108]
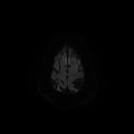
[im 108/108]
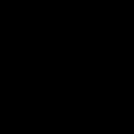

[Series 7: DWI · axial · 3.0mm · 2.09mm/px · z∈[-29,+115]mm · 3 of 50 slices shown (2 of 2)]
[im 1/50]
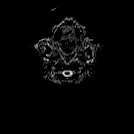
[im 25/50]
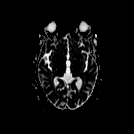
[im 50/50]
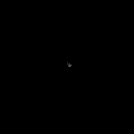

[Series 8: mip_images(sw) · axial · 24.0mm · 0.86mm/px · z∈[-63,+93]mm · 3 of 53 slices shown]
[im 1/53]
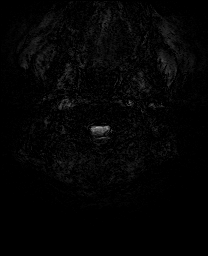
[im 27/53]
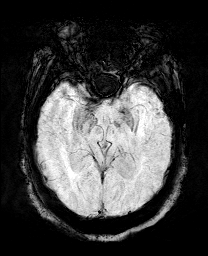
[im 53/53]
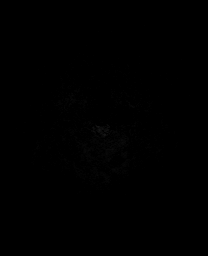

[Series 9: swi_images · axial · 3.0mm · 0.86mm/px · z∈[-74,+103]mm · 3 of 60 slices shown]
[im 1/60]
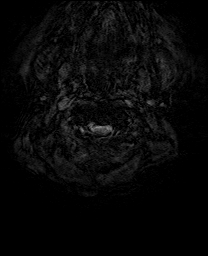
[im 30/60]
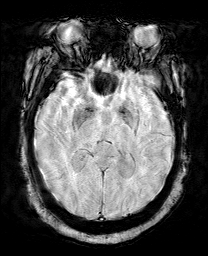
[im 60/60]
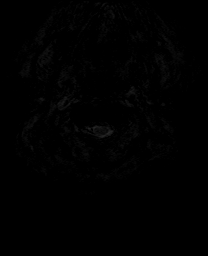

[Series 10: T2 · axial · 5.0mm · 0.57mm/px · 1 of 29 slices shown]
[im 1/29]
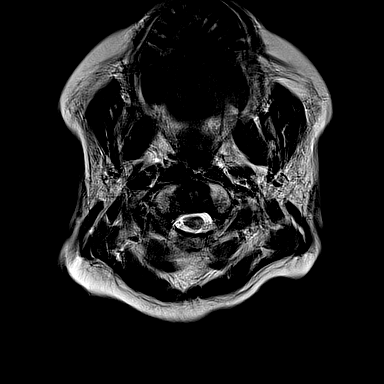

[Series 11: FLAIR · axial · 3.0mm · 0.57mm/px · z∈[-36,+120]mm · 3 of 54 slices shown]
[im 1/54]
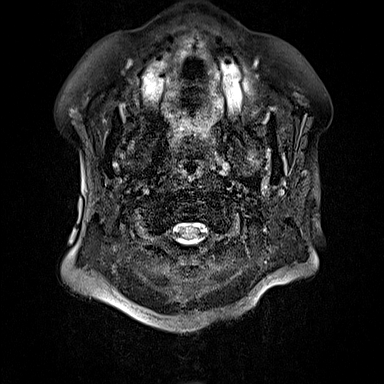
[im 27/54]
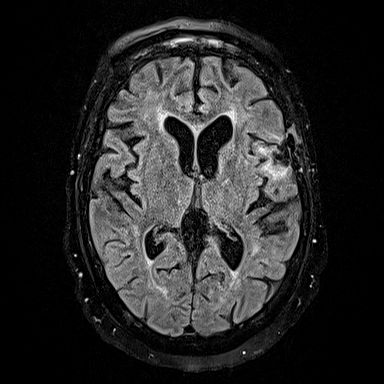
[im 54/54]
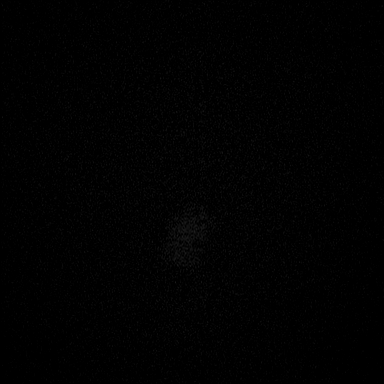

[Series 16: T1 · axial · 1.0mm · 0.94mm/px · z∈[-144,+44]mm · 10 of 192 slices shown (2 of 2)]
[im 1/192]
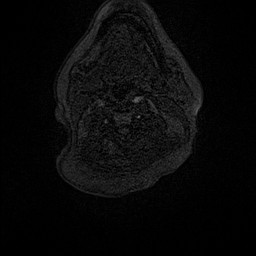
[im 22/192]
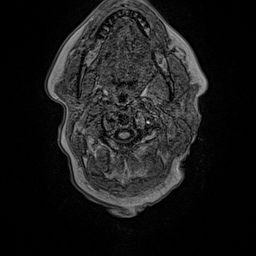
[im 43/192]
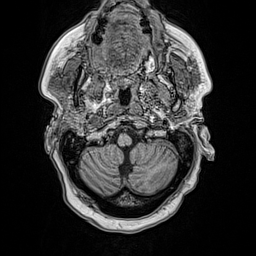
[im 64/192]
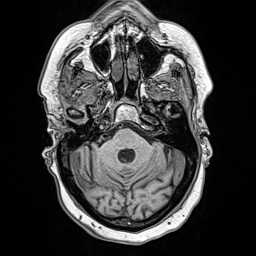
[im 85/192]
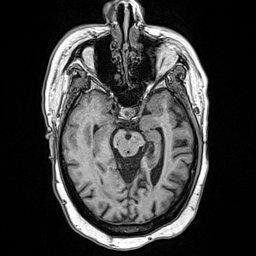
[im 107/192]
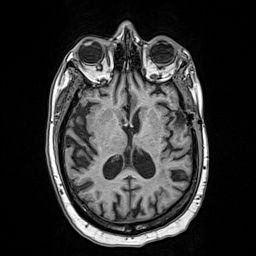
[im 128/192]
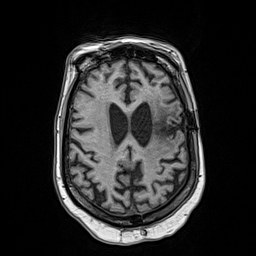
[im 149/192]
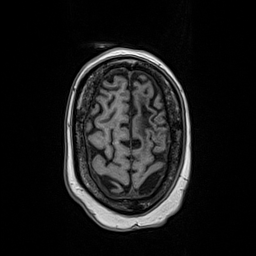
[im 170/192]
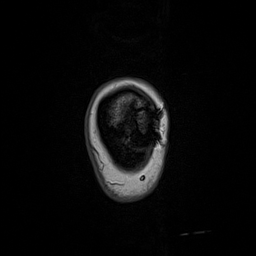
[im 192/192]
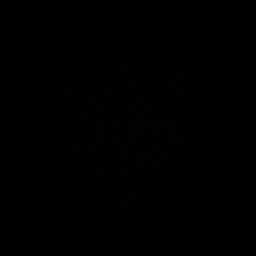

[Series 17: T2 post-contrast · coronal · 3.0mm · 0.57mm/px · 3 of 56 slices shown]
[im 1/56]
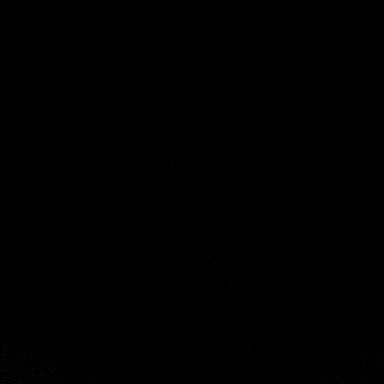
[im 28/56]
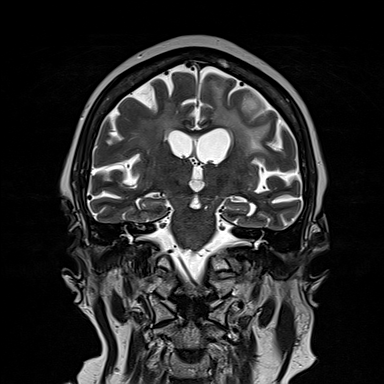
[im 56/56]
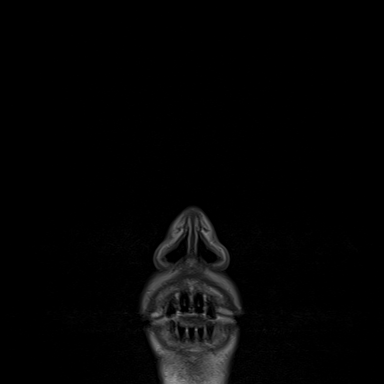

[Series 18: T1 post-contrast · axial · 1.0mm · 0.94mm/px · z∈[-144,+44]mm · 10 of 192 slices shown (1 of 3)]
[im 1/192]
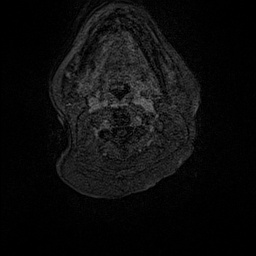
[im 22/192]
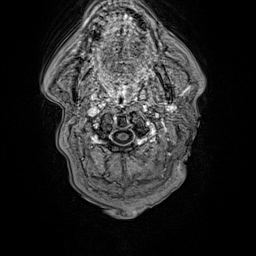
[im 43/192]
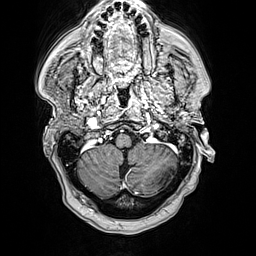
[im 64/192]
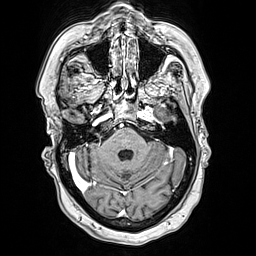
[im 85/192]
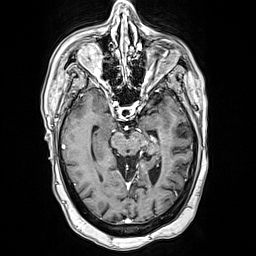
[im 107/192]
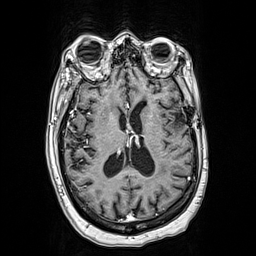
[im 128/192]
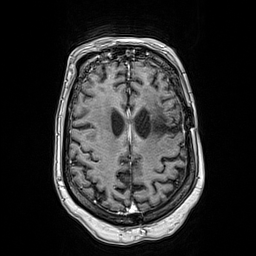
[im 149/192]
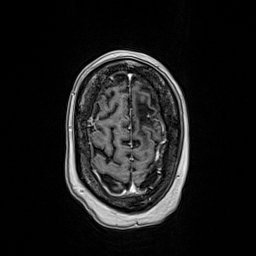
[im 170/192]
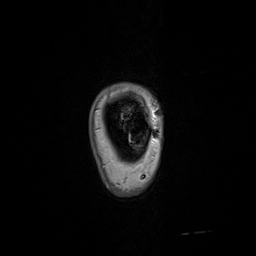
[im 192/192]
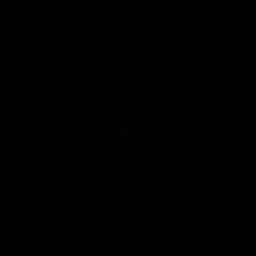

[Series 19: T1 post-contrast · coronal · 3.0mm · 0.57mm/px · 3 of 56 slices shown (2 of 3)]
[im 1/56]
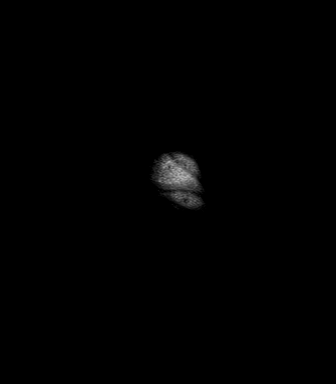
[im 28/56]
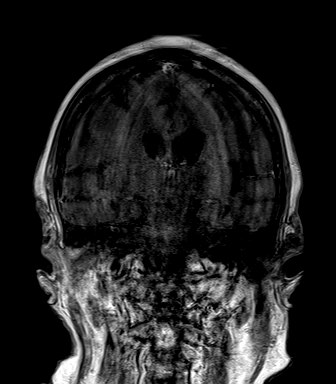
[im 56/56]
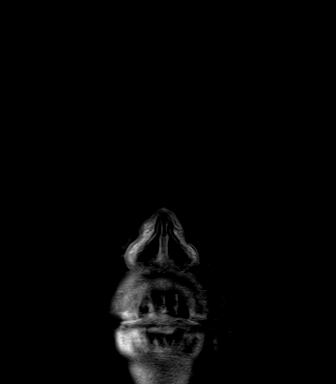

[Series 20: T1 post-contrast · sagittal · 3.0mm · 0.75mm/px · 2 of 40 slices shown (3 of 3)]
[im 1/40]
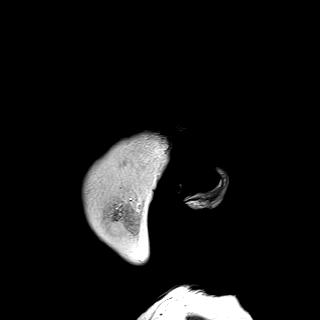
[im 40/40]
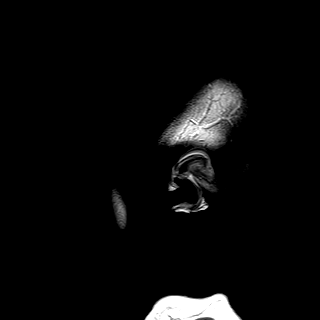

[48 of 48 positions shown; findings below may reference images not displayed]

FINDINGS: Motion artifact is present.

Brain: There is no acute infarction or intracranial hemorrhage.
There is no intracranial mass, mass effect, or edema. There is no
hydrocephalus or extra-axial fluid collection.

Encephalomalacia/gliosis the left frontal lobe. There are some
associated chronic blood products posteriorly. Additional patchy and
confluent areas of T2 hyperintensity in the supratentorial white
matter nonspecific may reflect therapy related changes. Focus of
susceptibility within the lateral right cerebellum compatible with
chronic hemorrhage less likely calcification. Prominence of the
ventricles and sulci reflects generalized parenchymal volume loss.

No abnormal enhancement.

Vascular: Major vessel flow voids at the skull base are preserved.

Skull and upper cervical spine: Left craniotomy. Marrow signal
within normal limits.

Sinuses/Orbits: Minor mucosal thickening.  Orbits are unremarkable.

Other: Sella is unremarkable. Bilateral partial mastoid fluid
opacification.
IMPRESSION: No evidence of intracranial metastatic disease.

Postoperative changes with left frontal encephalomalacia/gliosis.

Age advanced white matter changes and volume loss that may reflect
therapy related.

## 2020-10-20 IMAGING — CT CT CHEST-ABD-PELV W/ CM
2 of 5 series · 13 of 36 positions shown, 15 images · IV contrast (OMNIPAQUE)
Comparison: None

CLINICAL DATA: Breast cancer staging, metastatic breast cancer.
55-year-old female with history of metastatic breast cancer.

EXAM:
CT CHEST, ABDOMEN, AND PELVIS WITH CONTRAST
TECHNIQUE: Multidetector CT imaging of the chest, abdomen and pelvis was
performed following the standard protocol during bolus
administration of intravenous contrast.
CONTRAST:  100mL OMNIPAQUE IOHEXOL 300 MG/ML  SOLN

[Series 2: cap with · axial · 0.77mm/px · z∈[+1043,+1563]mm · 10 of 128 slices shown, 12 images]
[im 12/128  mediastinal]
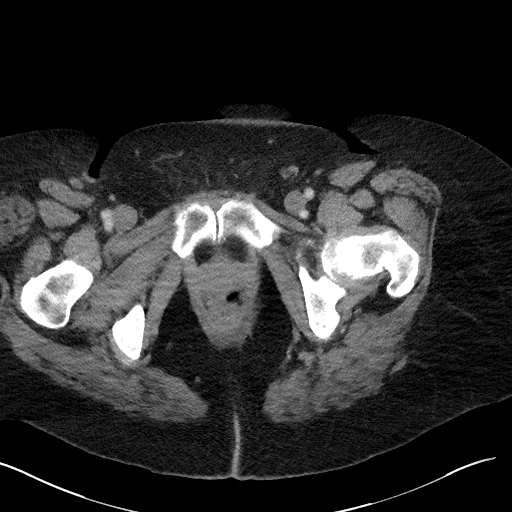
[im 12/128  bone]
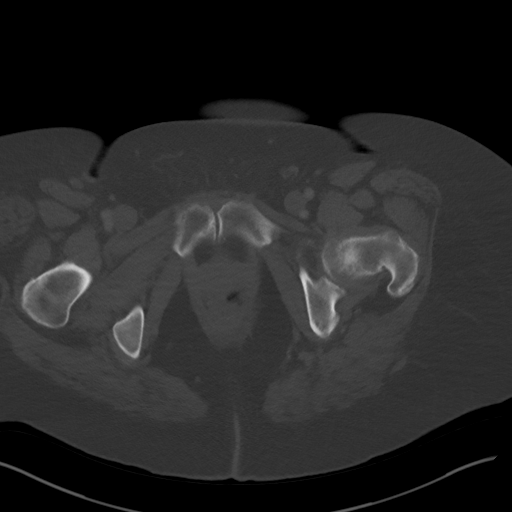
[im 24/128  mediastinal]
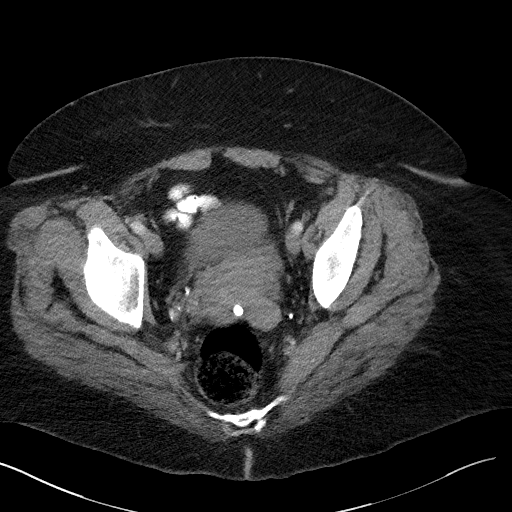
[im 35/128  mediastinal]
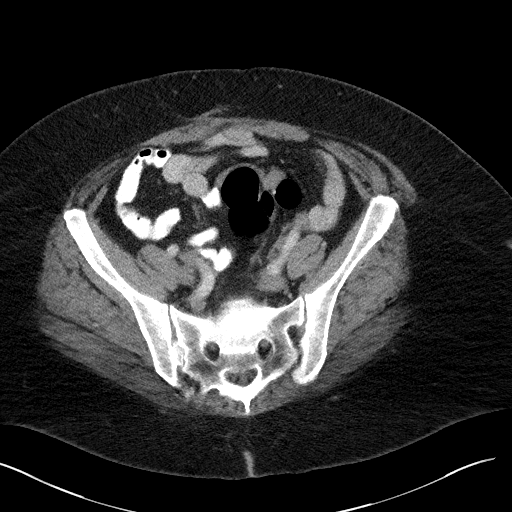
[im 47/128  mediastinal]
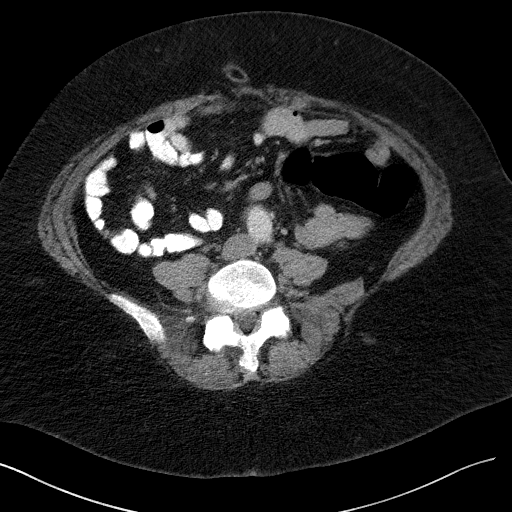
[im 58/128  mediastinal]
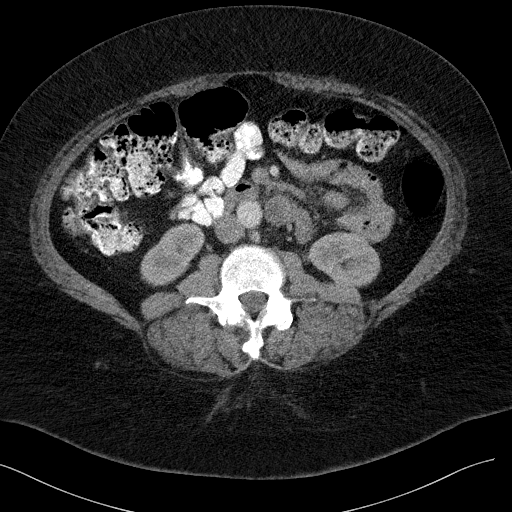
[im 70/128  mediastinal]
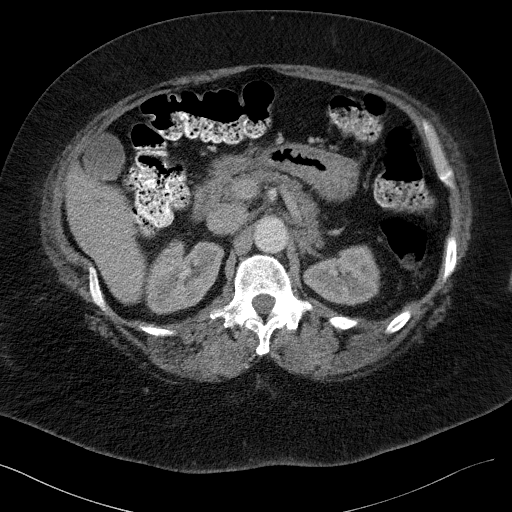
[im 81/128  mediastinal]
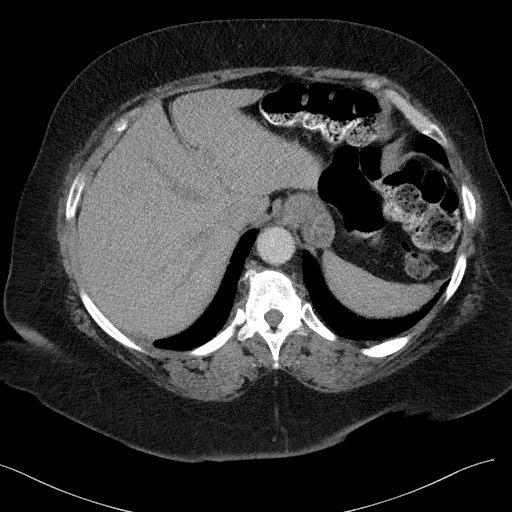
[im 93/128  mediastinal]
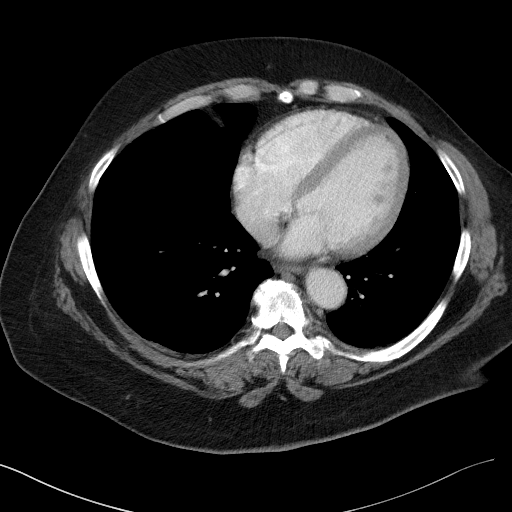
[im 104/128  mediastinal]
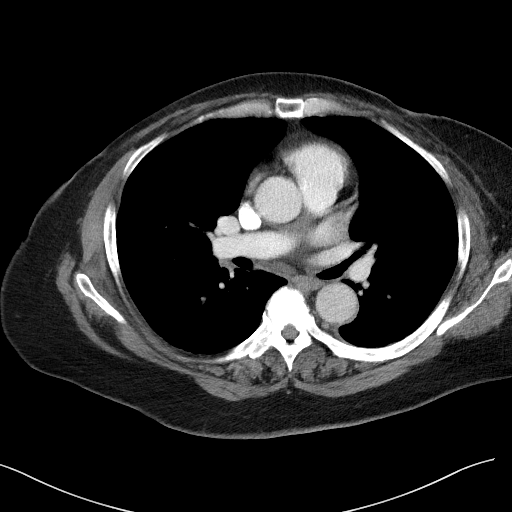
[im 104/128  bone]
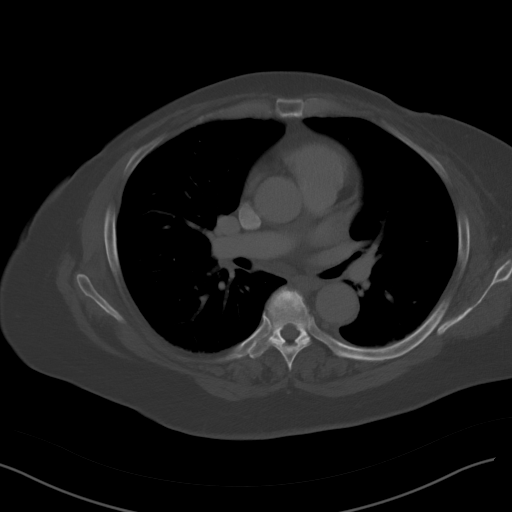
[im 116/128  mediastinal]
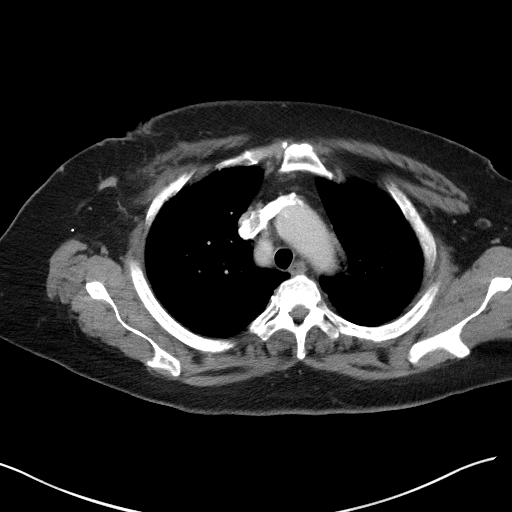

[Series 4: coronals · coronal · 1.02mm/px · 3 of 141 slices shown]
[im 29/141  mediastinal]
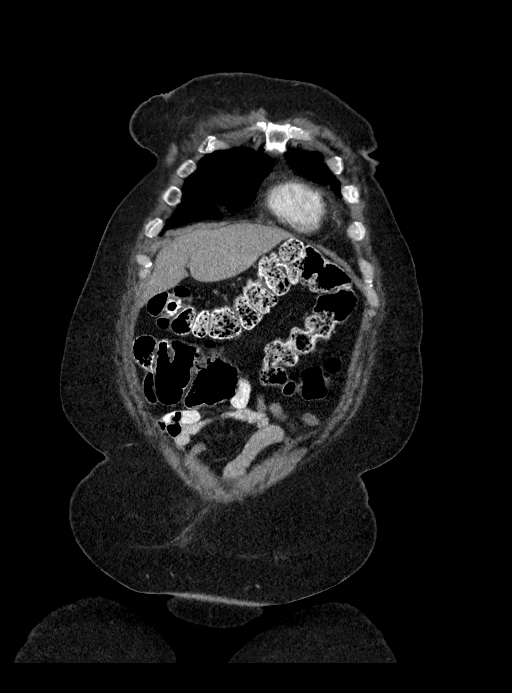
[im 57/141  mediastinal]
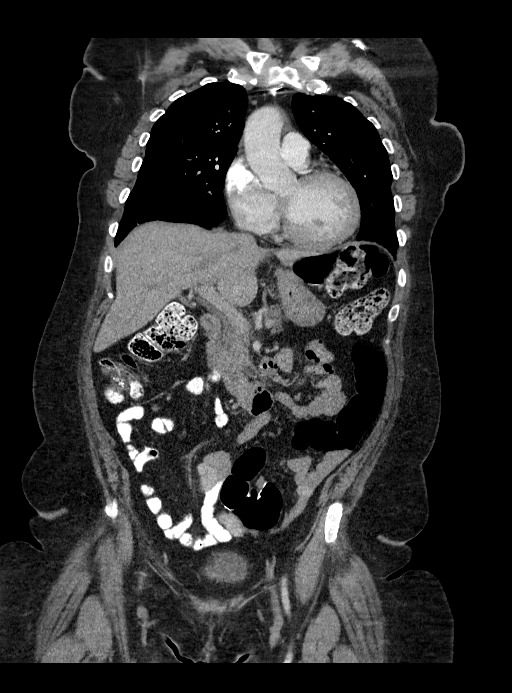
[im 85/141  mediastinal]
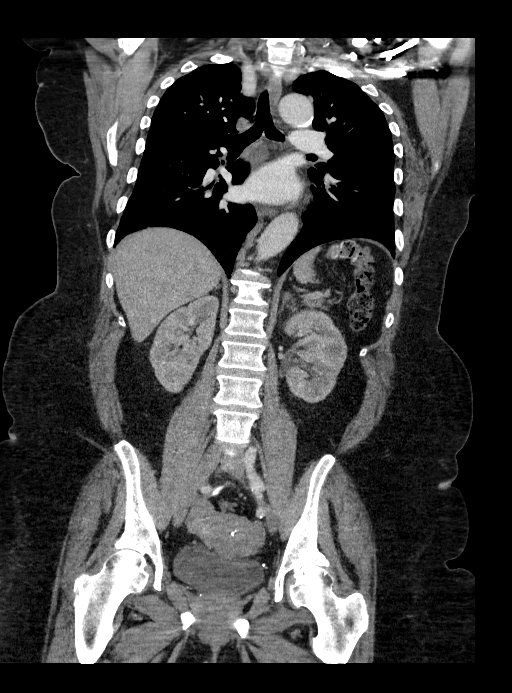

[13 of 36 positions shown; findings below may reference images not displayed]

FINDINGS: CT CHEST FINDINGS

Cardiovascular: LEFT-sided Port-A-Cath terminates at the proximal
portion of the SVC. Heart size is normal without pericardial
effusion.

Central pulmonary vasculature is unremarkable on venous phase
assessment.

Aorta 3.6 cm maximal caliber.

Mediastinum/Nodes: No internal mammary lymphadenopathy. No thoracic
inlet lymphadenopathy. No axillary lymphadenopathy. Post RIGHT
axillary dissection.

Post bilateral mastectomy. Mild skin thickening over the RIGHT chest
wall in the immediate subcutaneous fat. Bandlike thickening
extending from the axilla towards the infer medial RIGHT breast
likely postoperative measuring approximately 5 mm greatest
thickness. (Image 10, series 2) this has a crescentic pattern best
appreciated on the coronal images.

Lungs/Pleura: Minimal basilar atelectasis in the RIGHT chest. No
effusion. No consolidation. Airways are patent.

Musculoskeletal: See below for full musculoskeletal details.

CT ABDOMEN PELVIS FINDINGS

Hepatobiliary: Liver with smooth contours. No visible lesion. No
pericholecystic stranding.

Pancreas: Mild atrophy of the pancreatic head. No ductal dilation or
sign of inflammation.

Spleen: Spleen normal size.  No focal lesion.

Adrenals/Urinary Tract: Adrenal glands are normal.

Symmetric renal enhancement. No hydronephrosis. Smooth contour of
the urinary bladder without perivesical stranding.

Stomach/Bowel: No acute gastrointestinal process. Appendix is
normal. Post partial colonic resection in the area of the sigmoid
with patulous anastomotic site. No Perianastomotic soft tissue or
stranding.

Vascular/Lymphatic:

Mildly tortuous abdominal aorta. Scattered atheromatous
calcifications. There is no gastrohepatic or hepatoduodenal ligament
lymphadenopathy. No retroperitoneal or mesenteric lymphadenopathy.

No pelvic sidewall lymphadenopathy.

Reproductive: Multilobulated uterus with areas of calcification,
coarse calcification. Pattern suggests multiple leiomyomas. Distinct
uterine anatomy is however not visible. This area measuring
approximately 7.6 x 6.7 cm. No adnexal mass.

Other: No ascites.

Musculoskeletal: Multilevel spinal degenerative changes. No acute or
destructive bone process.
IMPRESSION: 1. Post bilateral mastectomy and RIGHT axillary dissection. Bandlike
thickening extending from the axilla towards the inferomedial RIGHT
breast has a crescentic pattern best appreciated on the coronal
images. This is likely postoperative change given the crescentic
pattern associated with this finding. Comparison with prior imaging
may be helpful.
2. Post partial colonic resection in the area of the sigmoid with
patulous anastomotic site. No perianastomotic soft tissue or
stranding.
3. Multilobulated uterus with areas of calcification, coarse
calcification. Pattern suggests multiple leiomyomas. Distinct
uterine anatomy is however not visible. Consider comparison with
prior imaging for this finding or ultrasound follow-up.

## 2020-10-20 MED ORDER — GADOBUTROL 1 MMOL/ML IV SOLN
10.0000 mL | Freq: Once | INTRAVENOUS | Status: AC | PRN
  Administered 2020-10-20: 10 mL via INTRAVENOUS

## 2020-10-20 MED ORDER — IOHEXOL 300 MG/ML  SOLN
100.0000 mL | Freq: Once | INTRAMUSCULAR | Status: AC | PRN
  Administered 2020-10-20: 100 mL via INTRAVENOUS

## 2020-10-23 NOTE — Assessment & Plan Note (Signed)
Moved to [place] from [place], [facility] in 02/2020 to live with her parents and son near her sister.  Prior treatment: Patient was on Herceptin and Perjeta maintenance although until June 2021 Because of insurance issues she has not had treatment in the last 7 months.  1. Severe recurrent headaches: Could be related to prior brain radiation.  We will need to get a brain MRI with and without contrast 2. Severe bilateral chest discomfort without any palpable lesions:CT CAP: Post Op changes in Rt chest wall. Multilob uterus. Brain MRI 10/21/20: No evidence of Mets.  Plan: Maintenance Herceptin

## 2020-10-23 NOTE — Progress Notes (Signed)
Patient Care Team: Ronnald Nian, DO as PCP - General (Family Medicine) Pcp, No as PCP - Family Medicine  DIAGNOSIS:    ICD-10-CM   1. Cancer of right breast metastatic to brain [facility])  C50.911    C79.31     SUMMARY OF ONCOLOGIC HISTORY: Oncology History  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[place])  06/13/2012 Initial Diagnosis   Right breast biopsy: Invasive ductal carcinoma, grade 3, HER-2 positive (3+), ER/PR negative, Ki67 5-10%, and in the right breast, high grade DCIS. BRCA gene testing was negative   08/06/2012 Surgery   Bilateral mastectomies:  Right breast: Invasive ductal carcinoma, 0.7cm, grade 2, with extensive DCIS, clear margins, 8/11 lymph nodes positive, measuring up to 3.8cm,  Left breast, no evidence of malignancy and 6 lymph nodes negative for carcinoma.    2014 - 2015 Chemotherapy   adjuvant chemotherapy with 4 cycles of dose dense Adriamycin and Cytoxan followed by 4 cycles of Taxol Herceptin and then Herceptin maintenance. She underwent right chest radiation.       12/24/2013 Relapse/Recurrence   Dizziness, headaches, ataxia, and memory loss. Head CT showed a 1.8cm and a 2.0cm frontal lobe masses. She underwent resection of both masses on 01/07/14,: Moderately differentiated carcinoma, likely of breast origin, HER-2 positive, ER/PR negative, CK7 positive.   2015 - 2015 Radiation Therapy   Received whole brain radiation    Chemotherapy   Taxotere, Herceptin and Perjeta, but Taxotere was discontinued after 4 cycles         CHIEF COMPLIANT: Follow-up of metastatic breast cancer, review scans  INTERVAL HISTORY: Monica Mccoy is a 55 y.o. with above-mentioned history of metastatic breast cancer. CT CAP on 10/20/20 showed no evidence of metastatic disease. Brain MRI on 10/20/20 showed no evidence of intracranial metastatic disease. She presents to the clinic today to review her scans and discuss treatment.   ALLERGIES:  is allergic to penicillins  and tramadol.  MEDICATIONS:  Current Outpatient Medications  Medication Sig Dispense Refill  . DULoxetine (CYMBALTA) 60 MG capsule Take 1 capsule (60 mg total) by mouth daily. 90 capsule 1  . gabapentin (NEURONTIN) 300 MG capsule Take 300 mg by mouth 3 (three) times daily.    Marland Kitchen levETIRAcetam (KEPPRA) 1000 MG tablet Take 1 tablet (1,000 mg total) by mouth 2 (two) times daily. 180 tablet 1  . pantoprazole (PROTONIX) 40 MG tablet Take 1 tablet (40 mg total) by mouth daily. 90 tablet 0   No current facility-administered medications for this visit.    PHYSICAL EXAMINATION: ECOG PERFORMANCE STATUS: 1 - Symptomatic but completely ambulatory  There were no vitals filed for this visit. There were no vitals filed for this visit.   LABORATORY DATA:  I have reviewed the data as listed CMP Latest Ref Rng & Units 09/23/2020 12/15/2019  Glucose 70 - 99 mg/dL 156(H) -  BUN 6 - 20 mg/dL 11 10  Creatinine 0.44 - 1.00 mg/dL 1.02(H) 0.8  Sodium 135 - 145 mmol/L 138 139  Potassium 3.5 - 5.1 mmol/L 3.4(L) 4.7  Chloride 98 - 111 mmol/L 103 104  CO2 22 - 32 mmol/L 27 28(A)  Calcium 8.9 - 10.3 mg/dL 9.0 9.3  Total Protein 6.5 - 8.1 g/dL 7.3 -  Total Bilirubin 0.3 - 1.2 mg/dL 0.7 -  Alkaline Phos 38 - 126 U/L 85 82  AST 15 - 41 U/L 10(L) 14  ALT 0 - 44 U/L <6 7    Lab Results  Component Value Date  WBC 3.7 (L) 09/23/2020   HGB 12.7 09/23/2020   HCT 38.5 09/23/2020   MCV 91.4 09/23/2020   PLT 175 09/23/2020   NEUTROABS 2.0 09/23/2020    ASSESSMENT & PLAN:  Cancer of right breast metastatic to brain [facility]) Moved to [facility] from [place], [facility]), in 02/2020 to live with her parents and son near her sister.  Prior treatment: Patient was on Herceptin and Perjeta maintenance until June 2021 Because of insurance issues she has not had treatment in the last 7 months.  1. Severe recurrent headaches: Could be related to prior brain radiation.  10/21/2020 brain MRI  with and without contrast: No evidence of intracranial metastatic disease.  Postoperative changes left frontal encephalomalacia/gliosis 2. Severe bilateral chest discomfort without any palpable lesions:CT CAP: Post Op changes in Rt chest wall. Multilob uterus.   She has an appointment to see Dr. Mickeal Skinner to address some of her brain related concerns.  We discussed at length about pros and cons of maintenance Herceptin.  She wants to receive the Herceptin treatments to prevent recurrence of metastatic disease. Plan: Maintenance Herceptin, we will do once every 4 weeks  We will get an x-ray of the chest for port placement Return to clinic every 4 weeks for Herceptin every 8 weeks to follow-up with me.  No orders of the defined types were placed in this encounter.  The patient has a good understanding of the overall plan. she agrees with it. she will call with any problems that may develop before the next visit here.  Total time spent: 30 mins including face to face time and time spent for planning, charting and coordination of care  Rulon Eisenmenger, MD, MPH 10/24/2020  I, Cloyde Reams Dorshimer, am acting as scribe for Dr. Nicholas Lose.  I have reviewed the above documentation for accuracy and completeness, and I agree with the above.

## 2020-10-24 ENCOUNTER — Ambulatory Visit (HOSPITAL_COMMUNITY)
Admission: RE | Admit: 2020-10-24 | Discharge: 2020-10-24 | Disposition: A | Discharge status: Home / Self Care | Payer: Medicare Other | Source: Ambulatory Visit | Attending: Hematology and Oncology | Admitting: Hematology and Oncology

## 2020-10-24 ENCOUNTER — Inpatient Hospital Stay: Payer: Medicare Other | Attending: Hematology and Oncology | Admitting: Hematology and Oncology

## 2020-10-24 ENCOUNTER — Telehealth: Payer: Self-pay | Admitting: Hematology and Oncology

## 2020-10-24 ENCOUNTER — Other Ambulatory Visit: Payer: Self-pay

## 2020-10-24 ENCOUNTER — Inpatient Hospital Stay (HOSPITAL_BASED_OUTPATIENT_CLINIC_OR_DEPARTMENT_OTHER): Payer: Medicare Other | Admitting: Internal Medicine

## 2020-10-24 ENCOUNTER — Telehealth: Payer: Self-pay | Admitting: Internal Medicine

## 2020-10-24 VITALS — BP 130/84 | HR 61 | Temp 97.5°F | Resp 18 | Ht 65.75 in | Wt 284.1 lb

## 2020-10-24 DIAGNOSIS — C7931 Secondary malignant neoplasm of brain: Secondary | ICD-10-CM | POA: Insufficient documentation

## 2020-10-24 DIAGNOSIS — Z79899 Other long term (current) drug therapy: Secondary | ICD-10-CM | POA: Diagnosis not present

## 2020-10-24 DIAGNOSIS — Z8669 Personal history of other diseases of the nervous system and sense organs: Secondary | ICD-10-CM | POA: Diagnosis not present

## 2020-10-24 DIAGNOSIS — Z9013 Acquired absence of bilateral breasts and nipples: Secondary | ICD-10-CM | POA: Insufficient documentation

## 2020-10-24 DIAGNOSIS — F419 Anxiety disorder, unspecified: Secondary | ICD-10-CM | POA: Insufficient documentation

## 2020-10-24 DIAGNOSIS — F32A Depression, unspecified: Secondary | ICD-10-CM | POA: Insufficient documentation

## 2020-10-24 DIAGNOSIS — G43909 Migraine, unspecified, not intractable, without status migrainosus: Secondary | ICD-10-CM | POA: Insufficient documentation

## 2020-10-24 DIAGNOSIS — Z171 Estrogen receptor negative status [ER-]: Secondary | ICD-10-CM | POA: Insufficient documentation

## 2020-10-24 DIAGNOSIS — C50411 Malignant neoplasm of upper-outer quadrant of right female breast: Secondary | ICD-10-CM | POA: Diagnosis present

## 2020-10-24 DIAGNOSIS — C50911 Malignant neoplasm of unspecified site of right female breast: Secondary | ICD-10-CM | POA: Insufficient documentation

## 2020-10-24 IMAGING — DX DG CHEST 1V
1 series · 1 of 1 positions shown · non-contrast
Comparison: Radiograph [DATE].  Chest CT [DATE]

CLINICAL DATA: Port placement. Patient reports chest discomfort
around port.

EXAM:
CHEST  1 VIEW

[chest pa]
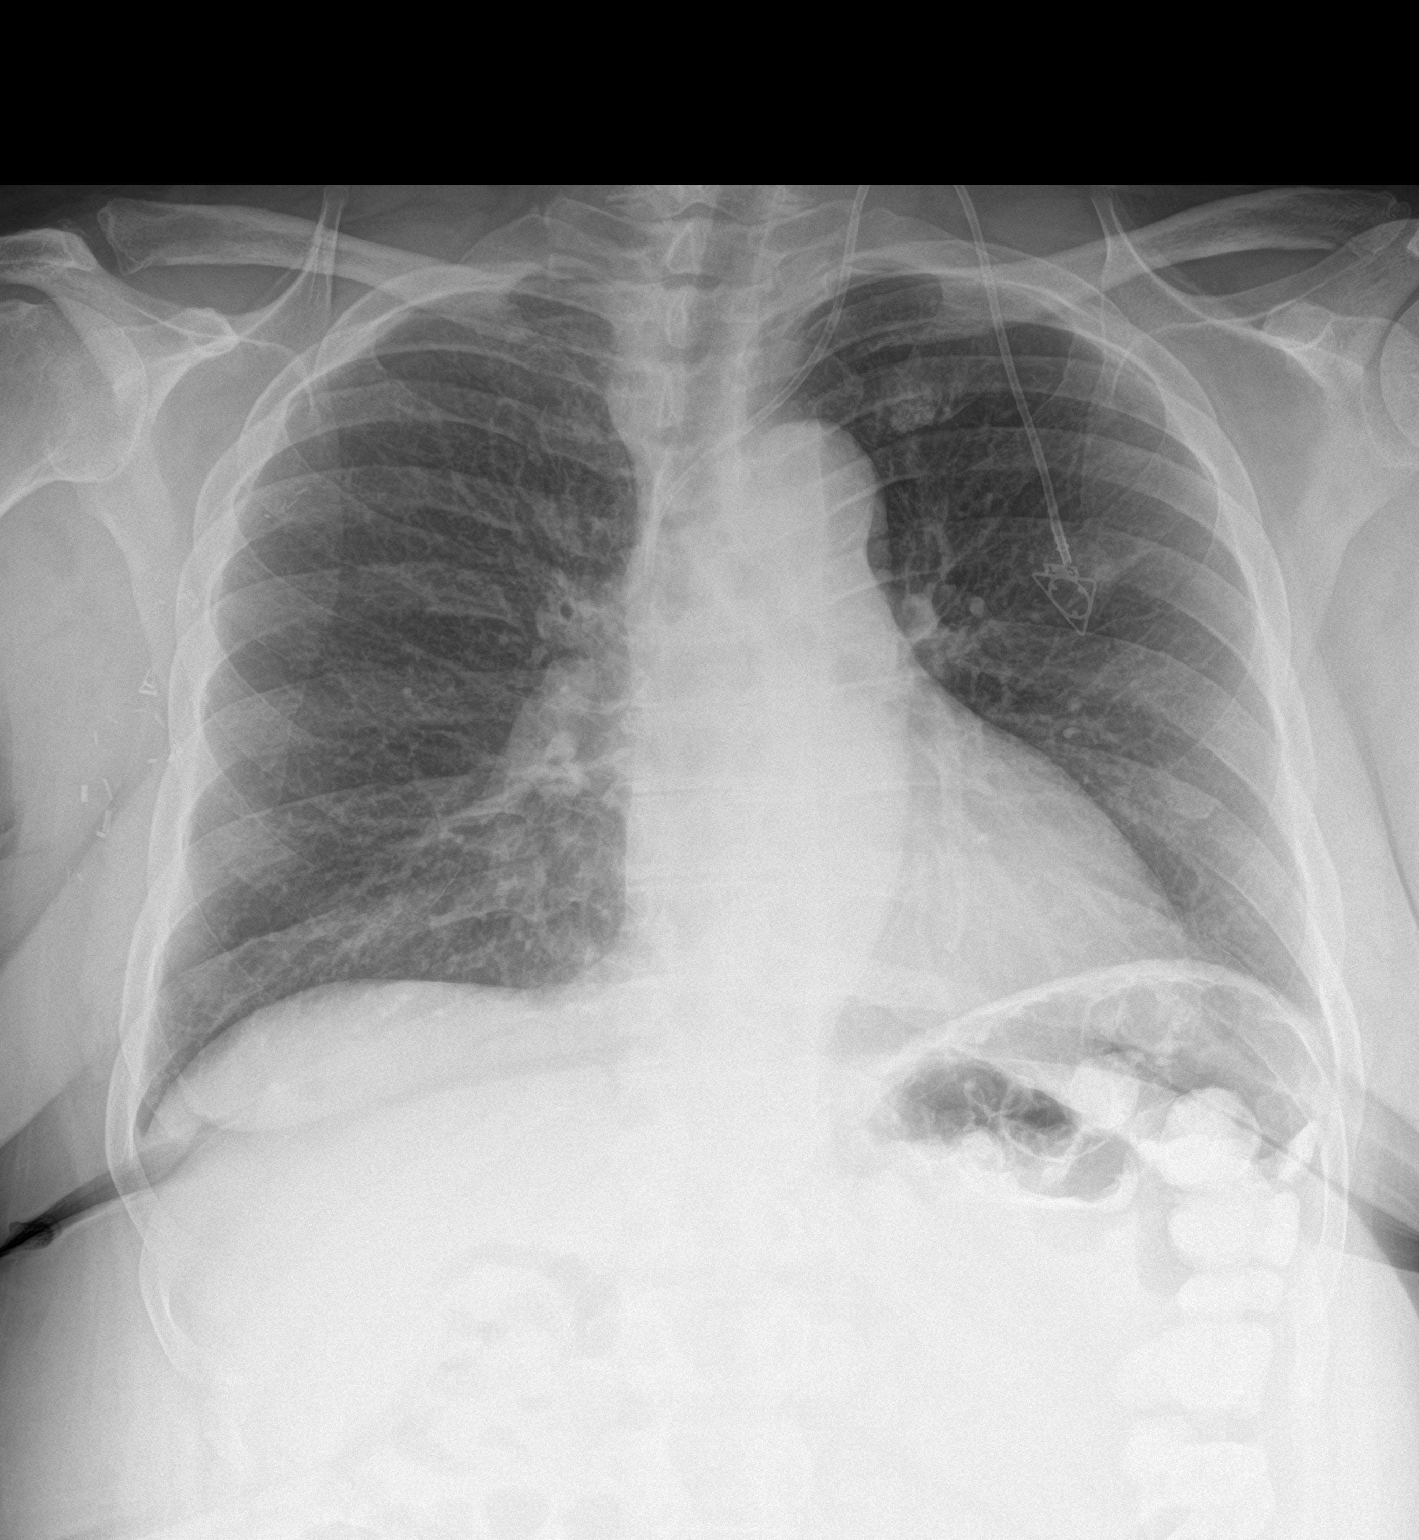

[1 of 1 positions shown; findings below may reference images not displayed]

FINDINGS: Left chest port in place. The tip projects over the upper SVC,
unchanged from prior exam. Tubing appears intact. Normal heart size
and mediastinal contours. Right paratracheal soft tissue density
corresponds to overlapping vascular structures on CT. No acute
airspace disease. No pleural fluid or pneumothorax. Surgical clips
in the right axilla. Barium in the colon from prior abdominal CT. No
acute osseous abnormalities are seen.
IMPRESSION: Left chest port in place with tip projected over the upper SVC. This
is unchanged from prior exam.

## 2020-10-24 NOTE — Telephone Encounter (Signed)
Scheduled appointment per 2/28 los. Spoke to patient who is aware of appointment date and time.

## 2020-10-24 NOTE — Progress Notes (Signed)
[place] at [address] [place], [place] [address] [phone]   New Patient Evaluation  Date of Service: 10/24/20 Patient Name: Monica Mccoy Patient MRN: [id number] Patient DOB: December 12, 1965 Provider: Ventura Sellers, MD  Identifying Statement:  Monica Mccoy is a 55 y.o. female with Cancer of right breast metastatic to brain [facility]) [C50.911, C79.31] who presents for initial consultation and evaluation regarding cancer associated neurologic deficits.    Referring Provider: Ronnald Nian, DO [address],  [place] [id number]  Primary Cancer:  Oncologic History: Oncology History  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[place])  06/13/2012 Initial Diagnosis   Right breast biopsy: Invasive ductal carcinoma, grade 3, HER-2 positive (3+), ER/PR negative, Ki67 5-10%, and in the right breast, high grade DCIS. BRCA gene testing was negative   08/06/2012 Surgery   Bilateral mastectomies:  Right breast: Invasive ductal carcinoma, 0.7cm, grade 2, with extensive DCIS, clear margins, 8/11 lymph nodes positive, measuring up to 3.8cm,  Left breast, no evidence of malignancy and 6 lymph nodes negative for carcinoma.    2014 - 2015 Chemotherapy   adjuvant chemotherapy with 4 cycles of dose dense Adriamycin and Cytoxan followed by 4 cycles of Taxol Herceptin and then Herceptin maintenance. She underwent right chest radiation.       12/24/2013 Relapse/Recurrence   Dizziness, headaches, ataxia, and memory loss. Head CT showed a 1.8cm and a 2.0cm frontal lobe masses. She underwent resection of both masses on 01/07/14,: Moderately differentiated carcinoma, likely of breast origin, HER-2 positive, ER/PR negative, CK7 positive.   2015 - 2015 Radiation Therapy   Received whole brain radiation    Chemotherapy   Taxotere, Herceptin and Perjeta, but Taxotere was discontinued after 4 cycles        CNS Oncologic History 2015: L  frontal craniotomy followed by WBRT at [facility] in [place]  History of Present Illness: The patient's records from the referring physician were obtained and reviewed and the patient interviewed to confirm this HPI.  Monica Mccoy presents today to review and assess neurologic complaints.  Much of history was provided by sister present at bedside.  She describes chronic short term memory loss, fatigue, and depression symptoms since undergoing whole brain radiation for metastatic breast cancer in 2015.  She also describes difficulty with communication, forming sentences and getting words out properly/fluidly.  Also describes twice weekly migraine headaches associated with nausea/vomiting, photophobia.  She will take a Tylenol or Tramodol late in the headache course, with minimal efficacy. Currently living with family in [place] after living most of life in [place] area.  Needs assistance with many ADLs because of cognitive impairment, physical disability secondary to chronic pain.  Her sleep pattern is very irregular.       Medications: Current Outpatient Medications on File Prior to Visit  Medication Sig Dispense Refill  . DULoxetine (CYMBALTA) 60 MG capsule Take 1 capsule (60 mg total) by mouth daily. 90 capsule 1  . gabapentin (NEURONTIN) 300 MG capsule Take 300 mg by mouth 3 (three) times daily.    Marland Kitchen levETIRAcetam (KEPPRA) 1000 MG tablet Take 1 tablet (1,000 mg total) by mouth 2 (two) times daily. 180 tablet 1  . pantoprazole (PROTONIX) 40 MG tablet Take 1 tablet (40 mg total) by mouth daily. 90 tablet 0   No current facility-administered medications on file prior to visit.    Allergies:  Allergies  Allergen Reactions  . Penicillins   . Tramadol  Past Medical History:  Past Medical History:  Diagnosis Date  . Anxiety   . Breast cancer metastasized to brain [facility])    Left breast  . Chronic pain after cancer treatment   . Depression   . GERD (gastroesophageal reflux disease)   .  History of cancer chemotherapy    Breast cancer left breast  . Seizure [facility])    Past Surgical History:  Past Surgical History:  Procedure Laterality Date  . BRAIN SURGERY     brain tumor  . BREAST SURGERY     Social History:  Social History   Socioeconomic History  . Marital status: Single    Spouse name: Not on file  . Number of children: Not on file  . Years of education: Not on file  . Highest education level: Not on file  Occupational History  . Not on file  Tobacco Use  . Smoking status: Never Smoker  . Smokeless tobacco: Never Used  Vaping Use  . Vaping Use: Never used  Substance and Sexual Activity  . Alcohol use: Yes    Comment: On occasion  . Drug use: Never  . Sexual activity: Not Currently  Other Topics Concern  . Not on file  Social History Narrative  . Not on file   Social Determinants of Health   Financial Resource Strain: Medium Risk  . Difficulty of Paying Living Expenses: Somewhat hard  Food Insecurity: Food Insecurity Present  . Worried About Charity fundraiser in the Last Year: Sometimes true  . Ran Out of Food in the Last Year: Never true  Transportation Needs: No Transportation Needs  . Lack of Transportation (Medical): No  . Lack of Transportation (Non-Medical): No  Physical Activity: Insufficiently Active  . Days of Exercise per Week: 1 day  . Minutes of Exercise per Session: 30 min  Stress: No Stress Concern Present  . Feeling of Stress : Only a little  Social Connections: Socially Isolated  . Frequency of Communication with Friends and Family: More than three times a week  . Frequency of Social Gatherings with Friends and Family: Once a week  . Attends Religious Services: Never  . Active Member of Clubs or Organizations: No  . Attends Archivist Meetings: Never  . Marital Status: Never married  Intimate Partner Violence: Not At Risk  . Fear of Current or Ex-Partner: No  . Emotionally Abused: No  . Physically Abused: No   . Sexually Abused: No   Family History: No family history on file.  Review of Systems: Constitutional: Doesn't report fevers, chills or abnormal weight loss Eyes: Doesn't report blurriness of vision Ears, nose, mouth, throat, and face: Doesn't report sore throat Respiratory: Doesn't report cough, dyspnea or wheezes Cardiovascular: Doesn't report palpitation, chest discomfort  Gastrointestinal:  Doesn't report nausea, constipation, diarrhea GU: Doesn't report incontinence Skin: Doesn't report skin rashes Neurological: Per HPI Musculoskeletal: Doesn't report joint pain Behavioral/Psych: Doesn't report anxiety  Physical Exam: Vitals:   10/24/20 1410  BP: 130/84  Pulse: 61  Resp: 18  Temp: (!) 97.5 F (36.4 C)  SpO2: 100%   KPS: 60. General: Alert, cooperative, pleasant, in no acute distress Head: Normal EENT: No conjunctival injection or scleral icterus.  Lungs: Resp effort normal Cardiac: Regular rate Abdomen: Non-distended abdomen Skin: No rashes cyanosis or petechiae. Extremities: No clubbing or edema  Neurologic Exam: Mental Status: Awake, alert, attentive to examiner. Oriented to self and environment. Language is noted for impairment in fluency, and frequent paraphasic errors.  Age advanced psychomotor slowing. Cranial Nerves: Visual acuity is grossly normal. Visual fields are full. Extra-ocular movements intact. No ptosis. Face is symmetric Motor: Tone and bulk are normal. Power is full in both arms and legs. Reflexes are symmetric, no pathologic reflexes present.  Sensory: Intact to light touch Gait: Deferred, orthopedic limitation  Labs: I have reviewed the data as listed    Component Value Date/Time   NA 138 09/23/2020 0835   NA 139 12/15/2019 0000   K 3.4 (L) 09/23/2020 0835   CL 103 09/23/2020 0835   CO2 27 09/23/2020 0835   GLUCOSE 156 (H) 09/23/2020 0835   BUN 11 09/23/2020 0835   BUN 10 12/15/2019 0000   CREATININE 1.02 (H) 09/23/2020 0835    CALCIUM 9.0 09/23/2020 0835   PROT 7.3 09/23/2020 0835   ALBUMIN 3.8 09/23/2020 0835   AST 10 (L) 09/23/2020 0835   ALT <6 09/23/2020 0835   ALKPHOS 85 09/23/2020 0835   BILITOT 0.7 09/23/2020 0835   GFRNONAA >60 09/23/2020 0835   Lab Results  Component Value Date   WBC 3.7 (L) 09/23/2020   NEUTROABS 2.0 09/23/2020   HGB 12.7 09/23/2020   HCT 38.5 09/23/2020   MCV 91.4 09/23/2020   PLT 175 09/23/2020    Imaging:  MR Brain W Wo Contrast  Result Date: 10/21/2020 CLINICAL DATA:  Syncope, breast cancer EXAM: MRI HEAD WITHOUT AND WITH CONTRAST TECHNIQUE: Multiplanar, multiecho pulse sequences of the brain and surrounding structures were obtained without and with intravenous contrast. CONTRAST:  18mL GADAVIST GADOBUTROL 1 MMOL/ML IV SOLN COMPARISON:  None. FINDINGS: Motion artifact is present. Brain: There is no acute infarction or intracranial hemorrhage. There is no intracranial mass, mass effect, or edema. There is no hydrocephalus or extra-axial fluid collection. Encephalomalacia/gliosis the left frontal lobe. There are some associated chronic blood products posteriorly. Additional patchy and confluent areas of T2 hyperintensity in the supratentorial white matter nonspecific may reflect therapy related changes. Focus of susceptibility within the lateral right cerebellum compatible with chronic hemorrhage less likely calcification. Prominence of the ventricles and sulci reflects generalized parenchymal volume loss. No abnormal enhancement. Vascular: Major vessel flow voids at the skull base are preserved. Skull and upper cervical spine: Left craniotomy. Marrow signal within normal limits. Sinuses/Orbits: Minor mucosal thickening.  Orbits are unremarkable. Other: Sella is unremarkable. Bilateral partial mastoid fluid opacification. IMPRESSION: No evidence of intracranial metastatic disease. Postoperative changes with left frontal encephalomalacia/gliosis. Age advanced white matter changes and  volume loss that may reflect therapy related. Electronically Signed   By: Macy Mis M.D.   On: 10/21/2020 10:12   CT CHEST ABDOMEN PELVIS W CONTRAST  Result Date: 10/21/2020 CLINICAL DATA:  Breast cancer staging, metastatic breast cancer. 55 year old female with history of metastatic breast cancer. EXAM: CT CHEST, ABDOMEN, AND PELVIS WITH CONTRAST TECHNIQUE: Multidetector CT imaging of the chest, abdomen and pelvis was performed following the standard protocol during bolus administration of intravenous contrast. CONTRAST:  134mL OMNIPAQUE IOHEXOL 300 MG/ML  SOLN COMPARISON:  None FINDINGS: CT CHEST FINDINGS Cardiovascular: LEFT-sided Port-A-Cath terminates at the proximal portion of the SVC. Heart size is normal without pericardial effusion. Central pulmonary vasculature is unremarkable on venous phase assessment. Aorta 3.6 cm maximal caliber. Mediastinum/Nodes: No internal mammary lymphadenopathy. No thoracic inlet lymphadenopathy. No axillary lymphadenopathy. Post RIGHT axillary dissection. Post bilateral mastectomy. Mild skin thickening over the RIGHT chest wall in the immediate subcutaneous fat. Bandlike thickening extending from the axilla towards the infer medial RIGHT breast likely postoperative measuring approximately  5 mm greatest thickness. (Image 10, series 2) this has a crescentic pattern best appreciated on the coronal images. Lungs/Pleura: Minimal basilar atelectasis in the RIGHT chest. No effusion. No consolidation. Airways are patent. Musculoskeletal: See below for full musculoskeletal details. CT ABDOMEN PELVIS FINDINGS Hepatobiliary: Liver with smooth contours. No visible lesion. No pericholecystic stranding. Pancreas: Mild atrophy of the pancreatic head. No ductal dilation or sign of inflammation. Spleen: Spleen normal size.  No focal lesion. Adrenals/Urinary Tract: Adrenal glands are normal. Symmetric renal enhancement. No hydronephrosis. Smooth contour of the urinary bladder without  perivesical stranding. Stomach/Bowel: No acute gastrointestinal process. Appendix is normal. Post partial colonic resection in the area of the sigmoid with patulous anastomotic site. No Perianastomotic soft tissue or stranding. Vascular/Lymphatic: Mildly tortuous abdominal aorta. Scattered atheromatous calcifications. There is no gastrohepatic or hepatoduodenal ligament lymphadenopathy. No retroperitoneal or mesenteric lymphadenopathy. No pelvic sidewall lymphadenopathy. Reproductive: Multilobulated uterus with areas of calcification, coarse calcification. Pattern suggests multiple leiomyomas. Distinct uterine anatomy is however not visible. This area measuring approximately 7.6 x 6.7 cm. No adnexal mass. Other: No ascites. Musculoskeletal: Multilevel spinal degenerative changes. No acute or destructive bone process. IMPRESSION: 1. Post bilateral mastectomy and RIGHT axillary dissection. Bandlike thickening extending from the axilla towards the inferomedial RIGHT breast has a crescentic pattern best appreciated on the coronal images. This is likely postoperative change given the crescentic pattern associated with this finding. Comparison with prior imaging may be helpful. 2. Post partial colonic resection in the area of the sigmoid with patulous anastomotic site. No perianastomotic soft tissue or stranding. 3. Multilobulated uterus with areas of calcification, coarse calcification. Pattern suggests multiple leiomyomas. Distinct uterine anatomy is however not visible. Consider comparison with prior imaging for this finding or ultrasound follow-up. Electronically Signed   By: Zetta Bills M.D.   On: 10/21/2020 16:31    Monica Mccoy Clinician Interpretation: I have personally reviewed the radiological images as listed.  My interpretation, in the context of the patient's clinical presentation, is stable disease   Assessment/Plan Cancer of right breast metastatic to brain [facility]) [C50.911, C79.31]  Monica Mccoy  presents today with syndrome consistent with history of brain metastases and exposure to brain radiation.    We counseled her extensively on effect this disease and its treatments can have on function, especially with regards to processing speed, short term memory, executive function, overall energy, and mood.  We discussed making healthy choices regarding diet, exercise, better sleep hygeine practice, and recommended working on her relationship to chronic stressors.    For headaches, we recommended earlier and more aggressive use of analgesia, which can be Ibuprofen or Naproxen for now.  We also encouraged her to work on her sleep and stress, per above, as these can worsen migraines.  If not improved we can add a headache preventative medication and try a triptan.    We spent twenty additional minutes teaching regarding the natural history, biology, and historical experience in the treatment of neurologic complications of cancer.   We appreciate the opportunity to participate in the care of Monica Mccoy.  She should return to clinic in ~2 months for further headache evaluation, or sooner if needed.  All questions were answered. The patient knows to call the clinic with any problems, questions or concerns. No barriers to learning were detected.  The total time spent in the encounter was 40 minutes and more than 50% was on counseling and review of test results   Ventura Sellers, MD Medical Director of Neuro-Oncology [facility]  [place] [facility] 10/24/20 2:14 PM

## 2020-10-24 NOTE — Telephone Encounter (Signed)
Scheduled appts per 2/28 los. Gave pt a print out of AVS.

## 2020-10-24 NOTE — Telephone Encounter (Signed)
Scheduled appts per 2/28 los. Scheduled treatments off of LOS. No treatment plan in at time of scheduling. Gave pt a print out of AVS.

## 2020-10-31 ENCOUNTER — Other Ambulatory Visit: Payer: Self-pay

## 2020-10-31 ENCOUNTER — Ambulatory Visit (INDEPENDENT_AMBULATORY_CARE_PROVIDER_SITE_OTHER): Payer: Medicare Other | Admitting: Podiatry

## 2020-10-31 DIAGNOSIS — T451X5A Adverse effect of antineoplastic and immunosuppressive drugs, initial encounter: Secondary | ICD-10-CM | POA: Diagnosis not present

## 2020-10-31 DIAGNOSIS — M2042 Other hammer toe(s) (acquired), left foot: Secondary | ICD-10-CM

## 2020-10-31 DIAGNOSIS — G620 Drug-induced polyneuropathy: Secondary | ICD-10-CM

## 2020-10-31 DIAGNOSIS — Q828 Other specified congenital malformations of skin: Secondary | ICD-10-CM | POA: Diagnosis not present

## 2020-10-31 DIAGNOSIS — M79675 Pain in left toe(s): Secondary | ICD-10-CM

## 2020-10-31 DIAGNOSIS — M79674 Pain in right toe(s): Secondary | ICD-10-CM | POA: Diagnosis not present

## 2020-10-31 DIAGNOSIS — B351 Tinea unguium: Secondary | ICD-10-CM

## 2020-10-31 DIAGNOSIS — M2041 Other hammer toe(s) (acquired), right foot: Secondary | ICD-10-CM

## 2020-11-06 ENCOUNTER — Encounter: Payer: Self-pay | Admitting: Podiatry

## 2020-11-06 NOTE — Progress Notes (Addendum)
Subjective: Monica Mccoy presents today with h/o chemotherapy induced neuropathy for complaint of painful corn(s) b/l 5th digits  which interfere(s) with ambulation. She also c/o painful, elongated, thick toenails b/l feet which are tender when wearing enclosed shoe gear.   Her PCP was Dr. Letta Median and last visit was 10/26/2020.  Allergies  Allergen Reactions  . Penicillins   . Tramadol     Social History   Occupational History  . Not on file  Tobacco Use  . Smoking status: Never Smoker  . Smokeless tobacco: Never Used  Vaping Use  . Vaping Use: Never used  Substance and Sexual Activity  . Alcohol use: Yes    Comment: On occasion  . Drug use: Never  . Sexual activity: Not Currently    Objective: Monica Mccoy is a pleasant 55 y.o. female morbidly obese in NAD.Marland Kitchen AAO x 3.  There were no vitals filed for this visit.  Vascular Examination:  Capillary fill time to digits <3 seconds b/l lower extremities. Palpable DP pulse(s) b/l lower extremities Faintly palpable PT pulse(s) b/l lower extremities. Pedal hair absent. Lower extremity skin temperature gradient within normal limits. No pain with calf compression b/l. No edema noted b/l lower extremities. No ischemia or gangrene noted b/l lower extremities.  Dermatological Examination: Pedal skin with normal turgor, texture and tone bilaterally. No interdigital macerations bilaterally. Toenails 1-5 b/l well maintained with adequate length. No erythema, no edema, no drainage, no fluctuance. Porokeratotic lesion(s) L 5th toe and R 5th toe. No erythema, no edema, no drainage, no fluctuance.  Musculoskeletal: Normal muscle strength 5/5 to all lower extremity muscle groups bilaterally. Hammertoes noted to the L 5th toe and R 5th toe.  Neurological: Pt has subjective symptoms of neuropathy. Protective sensation decreased with 10 gram monofilament b/l. Vibratory sensation intact b/l.  Assessment: 1. Pain due to onychomycosis of  toenails of both feet   2. Hammer toes of both feet   3. Porokeratosis   4. Chemotherapy-induced neuropathy ([place])     Plan: -Examined patient. Toenails 1-5 b/l debrided in length and girth without iatrogenic laceration.  -Painful porokeratotic lesion(s) L 5th toe and R 5th toe pared and enucleated with sterile scalpel blade without incident. -Continue soft, supportive shoe gear daily. -Patient to report any pedal injuries to medical professional immediately. -Patient to continue soft, supportive shoe gear daily. -Patient/POA to call should there be question/concern in the interim.  Return in about 3 months (around 01/31/2021).

## 2020-11-17 ENCOUNTER — Other Ambulatory Visit: Payer: Self-pay | Admitting: *Deleted

## 2020-11-17 ENCOUNTER — Telehealth: Payer: Self-pay | Admitting: Family Medicine

## 2020-11-17 ENCOUNTER — Other Ambulatory Visit: Payer: Self-pay | Admitting: Hematology and Oncology

## 2020-11-17 DIAGNOSIS — Z5181 Encounter for therapeutic drug level monitoring: Secondary | ICD-10-CM

## 2020-11-17 DIAGNOSIS — C50911 Malignant neoplasm of unspecified site of right female breast: Secondary | ICD-10-CM

## 2020-11-17 DIAGNOSIS — C7931 Secondary malignant neoplasm of brain: Secondary | ICD-10-CM

## 2020-11-17 NOTE — Telephone Encounter (Signed)
Ok to give verbal order for OT as requested

## 2020-11-17 NOTE — Progress Notes (Signed)
Per MD request, orders placed and scheduled for echocardiogram. Pt sister notified and verbalized understanding of date and time of apt.

## 2020-11-17 NOTE — Telephone Encounter (Signed)
Radovan OT with [facility] calling to get verbal to continue Occupational Therapy for 1 time a week for 2 weeks. Callback [phone] Okay to leave message if he doesn't answer

## 2020-11-18 ENCOUNTER — Other Ambulatory Visit: Payer: Self-pay

## 2020-11-18 ENCOUNTER — Ambulatory Visit (HOSPITAL_COMMUNITY)
Admission: RE | Admit: 2020-11-18 | Discharge: 2020-11-18 | Disposition: A | Discharge status: Home / Self Care | Payer: Medicare Other | Source: Ambulatory Visit | Attending: Hematology and Oncology | Admitting: Hematology and Oncology

## 2020-11-18 DIAGNOSIS — Z79899 Other long term (current) drug therapy: Secondary | ICD-10-CM | POA: Insufficient documentation

## 2020-11-18 DIAGNOSIS — Z5181 Encounter for therapeutic drug level monitoring: Secondary | ICD-10-CM | POA: Insufficient documentation

## 2020-11-18 DIAGNOSIS — Z0189 Encounter for other specified special examinations: Secondary | ICD-10-CM | POA: Diagnosis not present

## 2020-11-18 LAB — ECHOCARDIOGRAM COMPLETE
Area-P 1/2: 2.29 cm2
S' Lateral: 3.1 cm

## 2020-11-18 NOTE — Telephone Encounter (Signed)
I spoke with Radovan OT with [facility], and gave him the Dr. Lurline Del ok for requested OT order.

## 2020-11-18 NOTE — Progress Notes (Signed)
  Echocardiogram 2D Echocardiogram has been performed.  Monica Mccoy 11/18/2020, 8:44 AM

## 2020-11-21 ENCOUNTER — Other Ambulatory Visit: Payer: Self-pay

## 2020-11-21 ENCOUNTER — Inpatient Hospital Stay: Payer: Medicare Other

## 2020-11-21 ENCOUNTER — Inpatient Hospital Stay: Payer: Medicare Other | Attending: Internal Medicine

## 2020-11-21 VITALS — BP 149/93 | HR 70 | Temp 98.1°F | Resp 18 | Ht 65.75 in | Wt 284.8 lb

## 2020-11-21 DIAGNOSIS — C50911 Malignant neoplasm of unspecified site of right female breast: Secondary | ICD-10-CM

## 2020-11-21 DIAGNOSIS — C50411 Malignant neoplasm of upper-outer quadrant of right female breast: Secondary | ICD-10-CM | POA: Diagnosis present

## 2020-11-21 DIAGNOSIS — Z170 Estrogen receptor positive status [ER+]: Secondary | ICD-10-CM | POA: Insufficient documentation

## 2020-11-21 DIAGNOSIS — Z95828 Presence of other vascular implants and grafts: Secondary | ICD-10-CM

## 2020-11-21 DIAGNOSIS — C7931 Secondary malignant neoplasm of brain: Secondary | ICD-10-CM | POA: Diagnosis present

## 2020-11-21 DIAGNOSIS — Z5112 Encounter for antineoplastic immunotherapy: Secondary | ICD-10-CM | POA: Diagnosis not present

## 2020-11-21 LAB — CBC WITH DIFFERENTIAL (CANCER CENTER ONLY)
Abs Immature Granulocytes: 0 10*3/uL (ref 0.00–0.07)
Basophils Absolute: 0 10*3/uL (ref 0.0–0.1)
Basophils Relative: 1 %
Eosinophils Absolute: 0.1 10*3/uL (ref 0.0–0.5)
Eosinophils Relative: 3 %
HCT: 36.4 % (ref 36.0–46.0)
Hemoglobin: 12 g/dL (ref 12.0–15.0)
Immature Granulocytes: 0 %
Lymphocytes Relative: 33 %
Lymphs Abs: 0.9 10*3/uL (ref 0.7–4.0)
MCH: 30.5 pg (ref 26.0–34.0)
MCHC: 33 g/dL (ref 30.0–36.0)
MCV: 92.6 fL (ref 80.0–100.0)
Monocytes Absolute: 0.4 10*3/uL (ref 0.1–1.0)
Monocytes Relative: 13 %
Neutro Abs: 1.4 10*3/uL — ABNORMAL LOW (ref 1.7–7.7)
Neutrophils Relative %: 50 %
Platelet Count: 186 10*3/uL (ref 150–400)
RBC: 3.93 MIL/uL (ref 3.87–5.11)
RDW: 13.3 % (ref 11.5–15.5)
WBC Count: 2.8 10*3/uL — ABNORMAL LOW (ref 4.0–10.5)
nRBC: 0 % (ref 0.0–0.2)

## 2020-11-21 LAB — CMP (CANCER CENTER ONLY)
ALT: 8 U/L (ref 0–44)
AST: 12 U/L — ABNORMAL LOW (ref 15–41)
Albumin: 3.5 g/dL (ref 3.5–5.0)
Alkaline Phosphatase: 84 U/L (ref 38–126)
Anion gap: 10 (ref 5–15)
BUN: 8 mg/dL (ref 6–20)
CO2: 24 mmol/L (ref 22–32)
Calcium: 8.5 mg/dL — ABNORMAL LOW (ref 8.9–10.3)
Chloride: 108 mmol/L (ref 98–111)
Creatinine: 0.78 mg/dL (ref 0.44–1.00)
GFR, Estimated: >60 mL/min (ref >60)
Glucose, Bld: 113 mg/dL — ABNORMAL HIGH (ref 70–99)
Potassium: 3.9 mmol/L (ref 3.5–5.1)
Sodium: 142 mmol/L (ref 135–145)
Total Bilirubin: 0.7 mg/dL (ref 0.3–1.2)
Total Protein: 7.1 g/dL (ref 6.5–8.1)

## 2020-11-21 MED ORDER — DIPHENHYDRAMINE HCL 25 MG PO CAPS
ORAL_CAPSULE | ORAL | Status: AC
  Filled 2020-11-21: qty 2

## 2020-11-21 MED ORDER — SODIUM CHLORIDE 0.9 % IV SOLN
Freq: Once | INTRAVENOUS | Status: AC
  Filled 2020-11-21: qty 250

## 2020-11-21 MED ORDER — SODIUM CHLORIDE 0.9% FLUSH
10.0000 mL | INTRAVENOUS | Status: DC | PRN
  Administered 2020-11-21: 10 mL via INTRAVENOUS
  Filled 2020-11-21: qty 10

## 2020-11-21 MED ORDER — DIPHENHYDRAMINE HCL 25 MG PO CAPS
50.0000 mg | ORAL_CAPSULE | Freq: Once | ORAL | Status: AC
  Administered 2020-11-21: 50 mg via ORAL

## 2020-11-21 MED ORDER — ACETAMINOPHEN 325 MG PO TABS
650.0000 mg | ORAL_TABLET | Freq: Once | ORAL | Status: AC
  Administered 2020-11-21: 650 mg via ORAL

## 2020-11-21 MED ORDER — ACETAMINOPHEN 325 MG PO TABS
ORAL_TABLET | ORAL | Status: AC
  Filled 2020-11-21: qty 2

## 2020-11-21 MED ORDER — HEPARIN SOD (PORK) LOCK FLUSH 100 UNIT/ML IV SOLN
500.0000 [IU] | Freq: Once | INTRAVENOUS | Status: AC | PRN
  Administered 2020-11-21: 500 [IU]
  Filled 2020-11-21: qty 5

## 2020-11-21 MED ORDER — TRASTUZUMAB-DKST CHEMO 150 MG IV SOLR
1050.0000 mg | Freq: Once | INTRAVENOUS | Status: AC
  Administered 2020-11-21: 1050 mg via INTRAVENOUS
  Filled 2020-11-21: qty 50

## 2020-11-21 MED ORDER — SODIUM CHLORIDE 0.9% FLUSH
10.0000 mL | INTRAVENOUS | Status: DC | PRN
  Administered 2020-11-21: 10 mL
  Filled 2020-11-21: qty 10

## 2020-11-21 NOTE — Patient Instructions (Signed)
Implanted Port Insertion, Care After This sheet gives you information about how to care for yourself after your procedure. Your health care provider may also give you more specific instructions. If you have problems or questions, contact your health care provider. What can I expect after the procedure? After the procedure, it is common to have:  Discomfort at the port insertion site.  Bruising on the skin over the port. This should improve over 3-4 days. Follow these instructions at home: Port care  After your port is placed, you will get a manufacturer's information card. The card has information about your port. Keep this card with you at all times.  Take care of the port as told by your health care provider. Ask your health care provider if you or a family member can get training for taking care of the port at home. A home health care nurse may also take care of the port.  Make sure to remember what type of port you have. Incision care  Follow instructions from your health care provider about how to take care of your port insertion site. Make sure you: ? Wash your hands with soap and water before and after you change your bandage (dressing). If soap and water are not available, use hand sanitizer. ? Change your dressing as told by your health care provider. ? Leave stitches (sutures), skin glue, or adhesive strips in place. These skin closures may need to stay in place for 2 weeks or longer. If adhesive strip edges start to loosen and curl up, you may trim the loose edges. Do not remove adhesive strips completely unless your health care provider tells you to do that.  Check your port insertion site every day for signs of infection. Check for: ? Redness, swelling, or pain. ? Fluid or blood. ? Warmth. ? Pus or a bad smell.      Activity  Return to your normal activities as told by your health care provider. Ask your health care provider what activities are safe for you.  Do not  lift anything that is heavier than 10 lb (4.5 kg), or the limit that you are told, until your health care provider says that it is safe. General instructions  Take over-the-counter and prescription medicines only as told by your health care provider.  Do not take baths, swim, or use a hot tub until your health care provider approves. Ask your health care provider if you may take showers. You may only be allowed to take sponge baths.  Do not drive for 24 hours if you were given a sedative during your procedure.  Wear a medical alert bracelet in case of an emergency. This will tell any health care providers that you have a port.  Keep all follow-up visits as told by your health care provider. This is important. Contact a health care provider if:  You cannot flush your port with saline as directed, or you cannot draw blood from the port.  You have a fever or chills.  You have redness, swelling, or pain around your port insertion site.  You have fluid or blood coming from your port insertion site.  Your port insertion site feels warm to the touch.  You have pus or a bad smell coming from the port insertion site. Get help right away if:  You have chest pain or shortness of breath.  You have bleeding from your port that you cannot control. Summary  Take care of the port as told by your   health care provider. Keep the manufacturer's information card with you at all times.  Change your dressing as told by your health care provider.  Contact a health care provider if you have a fever or chills or if you have redness, swelling, or pain around your port insertion site.  Keep all follow-up visits as told by your health care provider. This information is not intended to replace advice given to you by your health care provider. Make sure you discuss any questions you have with your health care provider. Document Revised: 03/11/2018 Document Reviewed: 03/11/2018 Elsevier Patient Education   2021 Elsevier Inc.  

## 2020-11-21 NOTE — Patient Instructions (Signed)
[facility] Discharge Instructions for Patients Receiving Chemotherapy  Today you received the following immunotherapy agent: Trastuzumab-dkst (Ogivri).  To help prevent nausea and vomiting after your treatment, we encourage you to take your nausea medication as directed by your MD.    If you develop nausea and vomiting that is not controlled by your nausea medication, call the clinic.   BELOW ARE SYMPTOMS THAT SHOULD BE REPORTED IMMEDIATELY:  *FEVER GREATER THAN 100.5 F  *CHILLS WITH OR WITHOUT FEVER  NAUSEA AND VOMITING THAT IS NOT CONTROLLED WITH YOUR NAUSEA MEDICATION  *UNUSUAL SHORTNESS OF BREATH  *UNUSUAL BRUISING OR BLEEDING  TENDERNESS IN MOUTH AND THROAT WITH OR WITHOUT PRESENCE OF ULCERS  *URINARY PROBLEMS  *BOWEL PROBLEMS  UNUSUAL RASH Items with * indicate a potential emergency and should be followed up as soon as possible.  Feel free to call the clinic should you have any questions or concerns. The clinic phone number is [phone].  Please show the [place] at check-in to the Emergency Department and triage nurse.  Trastuzumab injection for infusion What is this medicine? TRASTUZUMAB (tras TOO zoo mab) is a monoclonal antibody. It is used to treat breast cancer and stomach cancer. This medicine may be used for other purposes; ask your health care provider or pharmacist if you have questions. COMMON BRAND NAME(S): Herceptin, Galvin Proffer, Trazimera What should I tell my health care provider before I take this medicine? They need to know if you have any of these conditions:  heart disease  heart failure  lung or breathing disease, like asthma  an unusual or allergic reaction to trastuzumab, benzyl alcohol, or other medications, foods, dyes, or preservatives  pregnant or trying to get pregnant  breast-feeding How should I use this medicine? This drug is given as an infusion into a vein. It is  administered in a hospital or clinic by a specially trained health care professional. Talk to your pediatrician regarding the use of this medicine in children. This medicine is not approved for use in children. Overdosage: If you think you have taken too much of this medicine contact a poison control center or emergency room at once. NOTE: This medicine is only for you. Do not share this medicine with others. What if I miss a dose? It is important not to miss a dose. Call your doctor or health care professional if you are unable to keep an appointment. What may interact with this medicine? This medicine may interact with the following medications:  certain types of chemotherapy, such as daunorubicin, doxorubicin, epirubicin, and idarubicin This list may not describe all possible interactions. Give your health care provider a list of all the medicines, herbs, non-prescription drugs, or dietary supplements you use. Also tell them if you smoke, drink alcohol, or use illegal drugs. Some items may interact with your medicine. What should I watch for while using this medicine? Visit your doctor for checks on your progress. Report any side effects. Continue your course of treatment even though you feel ill unless your doctor tells you to stop. Call your doctor or health care professional for advice if you get a fever, chills or sore throat, or other symptoms of a cold or flu. Do not treat yourself. Try to avoid being around people who are sick. You may experience fever, chills and shaking during your first infusion. These effects are usually mild and can be treated with other medicines. Report any side effects during the infusion to your health  care professional. Fever and chills usually do not happen with later infusions. Do not become pregnant while taking this medicine or for 7 months after stopping it. Women should inform their doctor if they wish to become pregnant or think they might be pregnant. Women  of child-bearing potential will need to have a negative pregnancy test before starting this medicine. There is a potential for serious side effects to an unborn child. Talk to your health care professional or pharmacist for more information. Do not breast-feed an infant while taking this medicine or for 7 months after stopping it. Women must use effective birth control with this medicine. What side effects may I notice from receiving this medicine? Side effects that you should report to your doctor or health care professional as soon as possible:  allergic reactions like skin rash, itching or hives, swelling of the face, lips, or tongue  chest pain or palpitations  cough  dizziness  feeling faint or lightheaded, falls  fever  general ill feeling or flu-like symptoms  signs of worsening heart failure like breathing problems; swelling in your legs and feet  unusually weak or tired Side effects that usually do not require medical attention (report to your doctor or health care professional if they continue or are bothersome):  bone pain  changes in taste  diarrhea  joint pain  nausea/vomiting  weight loss This list may not describe all possible side effects. Call your doctor for medical advice about side effects. You may report side effects to FDA at 1-800-FDA-1088. Where should I keep my medicine? This drug is given in a hospital or clinic and will not be stored at home. NOTE: This sheet is a summary. It may not cover all possible information. If you have questions about this medicine, talk to your doctor, pharmacist, or health care provider.  2021 Elsevier/Gold Standard (2016-08-07 14:37:52)

## 2020-11-22 ENCOUNTER — Telehealth: Payer: Self-pay | Admitting: *Deleted

## 2020-11-22 NOTE — Telephone Encounter (Signed)
-----   Message from Lester [place], RN sent at 11/21/2020  5:58 PM EDT ----- Regarding: Dr. Lindi Adie Pt. had Trastuzumab-dkst first time since 2014-2015. Tolerated treatment well with out any issues.

## 2020-11-22 NOTE — Telephone Encounter (Signed)
Called & spoke with pt's sister to see how pt did with her recent treatment.  She reports pt felt light headed yesterday & tired & slept.  She denies any concerns at this time but knows how to reach Dr Geralyn Flash RN & will call if pt has any issues when she wakes up.

## 2020-11-23 ENCOUNTER — Ambulatory Visit: Payer: Medicare Other | Admitting: Diagnostic Neuroimaging

## 2020-12-15 ENCOUNTER — Telehealth: Payer: Self-pay

## 2020-12-15 DIAGNOSIS — H543 Unqualified visual loss, both eyes: Secondary | ICD-10-CM

## 2020-12-15 NOTE — Telephone Encounter (Signed)
Pts sister is requesting a referral (her insurance requires referral) to and eye Dr. Patient has lost her glasses and needs new pair. Does she need an ov to obtain this referral?  Thank you

## 2020-12-15 NOTE — Telephone Encounter (Signed)
Referral placed.

## 2020-12-18 NOTE — Progress Notes (Signed)
Patient Care Team: Ronnald Nian, DO as PCP - General (Family Medicine) Pcp, No as PCP - Family Medicine  DIAGNOSIS:    ICD-10-CM   1. Cancer of right breast metastatic to brain [facility])  C50.911    C79.31     SUMMARY OF ONCOLOGIC HISTORY: Oncology History  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[name])  06/13/2012 Initial Diagnosis   Right breast biopsy: Invasive ductal carcinoma, grade 3, HER-2 positive (3+), ER/PR negative, Ki67 5-10%, and in the right breast, high grade DCIS. BRCA gene testing was negative   08/06/2012 Surgery   Bilateral mastectomies:  Right breast: Invasive ductal carcinoma, 0.7cm, grade 2, with extensive DCIS, clear margins, 8/11 lymph nodes positive, measuring up to 3.8cm,  Left breast, no evidence of malignancy and 6 lymph nodes negative for carcinoma.    2014 - 2015 Chemotherapy   adjuvant chemotherapy with 4 cycles of dose dense Adriamycin and Cytoxan followed by 4 cycles of Taxol Herceptin and then Herceptin maintenance. She underwent right chest radiation.       12/24/2013 Relapse/Recurrence   Dizziness, headaches, ataxia, and memory loss. Head CT showed a 1.8cm and a 2.0cm frontal lobe masses. She underwent resection of both masses on 01/07/14,: Moderately differentiated carcinoma, likely of breast origin, HER-2 positive, ER/PR negative, CK7 positive.   2015 - 2015 Radiation Therapy   Received whole brain radiation    Chemotherapy   Taxotere, Herceptin and Perjeta, but Taxotere was discontinued after 4 cycles       11/21/2020 -  Chemotherapy      Patient is on Antibody Plan: BREAST TRASTUZUMAB Q21D      CHIEF COMPLIANT: Follow-up of metastatic breast cancer  INTERVAL HISTORY: Monica Mccoy is a 55 y.o. with above-mentioned history of metastatic breast cancer currently on Herceptin. Echo on 11/18/20 showed an ejection fraction of 60-65%. She presents to the clinic today to review her scans and discuss treatment.   She is tolerating  Herceptin extremely well without any GI symptoms.  She is in a wheelchair.  ALLERGIES:  is allergic to dilaudid [hydromorphone hcl], morphine and related, penicillins, and tramadol.  MEDICATIONS:  Current Outpatient Medications  Medication Sig Dispense Refill  . acetaminophen (TYLENOL) 500 MG tablet Take 1,000 mg by mouth every 6 (six) hours as needed.    . DULoxetine (CYMBALTA) 60 MG capsule Take 1 capsule (60 mg total) by mouth daily. 90 capsule 1  . gabapentin (NEURONTIN) 300 MG capsule Take 300 mg by mouth 3 (three) times daily.    . pantoprazole (PROTONIX) 40 MG tablet Take 1 tablet (40 mg total) by mouth daily. 90 tablet 0   No current facility-administered medications for this visit.    PHYSICAL EXAMINATION: ECOG PERFORMANCE STATUS: 1 - Symptomatic but completely ambulatory  Vitals:   12/19/20 1045  BP: 124/77  Pulse: 62  Resp: 17  Temp: 97.7 F (36.5 C)  SpO2: 98%   Filed Weights   12/19/20 1045  Weight: 288 lb 14.4 oz (131 kg)    LABORATORY DATA:  I have reviewed the data as listed CMP Latest Ref Rng & Units 11/21/2020 09/23/2020 12/15/2019  Glucose 70 - 99 mg/dL 113(H) 156(H) -  BUN 6 - 20 mg/dL $Remove'8 11 10  'OPjKbDY$ Creatinine 0.44 - 1.00 mg/dL 0.78 1.02(H) 0.8  Sodium 135 - 145 mmol/L 142 138 139  Potassium 3.5 - 5.1 mmol/L 3.9 3.4(L) 4.7  Chloride 98 - 111 mmol/L 108 103 104  CO2 22 - 32 mmol/L 24 27 28(A)  Calcium 8.9 - 10.3 mg/dL 8.5(L) 9.0 9.3  Total Protein 6.5 - 8.1 g/dL 7.1 7.3 -  Total Bilirubin 0.3 - 1.2 mg/dL 0.7 0.7 -  Alkaline Phos 38 - 126 U/L 84 85 82  AST 15 - 41 U/L 12(L) 10(L) 14  ALT 0 - 44 U/L 8 <6 7    Lab Results  Component Value Date   WBC 3.2 (L) 12/19/2020   HGB 11.3 (L) 12/19/2020   HCT 35.3 (L) 12/19/2020   MCV 93.9 12/19/2020   PLT 201 12/19/2020   NEUTROABS 1.8 12/19/2020    ASSESSMENT & PLAN:  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[place]) Moved to [facility] from [facility]), in 02/2020 to live  with her parents and son near her sister.  Prior treatment: Patient was on Herceptin and Perjeta maintenance until June 2021 Because of insurance issues she has not had treatment in the last 7 months.  1.Severe recurrent headaches: Could be related to prior brain radiation.  10/21/2020 brain MRI with and without contrast: No evidence of intracranial metastatic disease.  Postoperative changes left frontal encephalomalacia/gliosis 2. Severe bilateral chest discomfort without any palpable lesions: 10/20/2020: CT CAP: Post Op changes in Rt chest wall. Multilob uterus.   She has an appointment to see Dr. Mickeal Skinner to address some of her brain related concerns.  Current treatment: Herceptin maintenance every 4 weeks started 11/21/2020 Herceptin toxicities: None We once again discussed the pros and cons of anti-HER2 therapy. Even though there was no evidence of obvious metastatic cancer on the scans, I recommended continuation of Herceptin therapy because of her prior history of metastatic disease. After a year of monthly Herceptin as we can switch to every 6 weeks thereafter.  No orders of the defined types were placed in this encounter.  The patient has a good understanding of the overall plan. she agrees with it. she will call with any problems that may develop before the next visit here.  Total time spent: 30 mins including face to face time and time spent for planning, charting and coordination of care  Rulon Eisenmenger, MD, MPH 12/19/2020  I, Molly Dorshimer, am acting as scribe for Dr. Nicholas Lose.  I have reviewed the above documentation for accuracy and completeness, and I agree with the above.

## 2020-12-19 ENCOUNTER — Inpatient Hospital Stay: Payer: Medicare Other | Attending: Internal Medicine

## 2020-12-19 ENCOUNTER — Inpatient Hospital Stay: Payer: Medicare Other

## 2020-12-19 ENCOUNTER — Inpatient Hospital Stay (HOSPITAL_BASED_OUTPATIENT_CLINIC_OR_DEPARTMENT_OTHER): Payer: Medicare Other | Admitting: Hematology and Oncology

## 2020-12-19 ENCOUNTER — Other Ambulatory Visit: Payer: Self-pay

## 2020-12-19 ENCOUNTER — Inpatient Hospital Stay (HOSPITAL_BASED_OUTPATIENT_CLINIC_OR_DEPARTMENT_OTHER): Payer: Medicare Other | Admitting: Internal Medicine

## 2020-12-19 ENCOUNTER — Other Ambulatory Visit: Payer: Medicare Other

## 2020-12-19 ENCOUNTER — Other Ambulatory Visit: Payer: Self-pay | Admitting: *Deleted

## 2020-12-19 DIAGNOSIS — G43009 Migraine without aura, not intractable, without status migrainosus: Secondary | ICD-10-CM

## 2020-12-19 DIAGNOSIS — Z9013 Acquired absence of bilateral breasts and nipples: Secondary | ICD-10-CM | POA: Insufficient documentation

## 2020-12-19 DIAGNOSIS — C50911 Malignant neoplasm of unspecified site of right female breast: Secondary | ICD-10-CM

## 2020-12-19 DIAGNOSIS — C50411 Malignant neoplasm of upper-outer quadrant of right female breast: Secondary | ICD-10-CM | POA: Insufficient documentation

## 2020-12-19 DIAGNOSIS — Z923 Personal history of irradiation: Secondary | ICD-10-CM | POA: Diagnosis not present

## 2020-12-19 DIAGNOSIS — Z5112 Encounter for antineoplastic immunotherapy: Secondary | ICD-10-CM | POA: Insufficient documentation

## 2020-12-19 DIAGNOSIS — Z9221 Personal history of antineoplastic chemotherapy: Secondary | ICD-10-CM | POA: Diagnosis not present

## 2020-12-19 DIAGNOSIS — C7931 Secondary malignant neoplasm of brain: Secondary | ICD-10-CM | POA: Insufficient documentation

## 2020-12-19 DIAGNOSIS — Z171 Estrogen receptor negative status [ER-]: Secondary | ICD-10-CM | POA: Diagnosis not present

## 2020-12-19 DIAGNOSIS — Z79899 Other long term (current) drug therapy: Secondary | ICD-10-CM | POA: Diagnosis not present

## 2020-12-19 DIAGNOSIS — Z95828 Presence of other vascular implants and grafts: Secondary | ICD-10-CM

## 2020-12-19 LAB — CMP (CANCER CENTER ONLY)
ALT: <6 U/L (ref 0–44)
AST: 10 U/L — ABNORMAL LOW (ref 15–41)
Albumin: 3.7 g/dL (ref 3.5–5.0)
Alkaline Phosphatase: 83 U/L (ref 38–126)
Anion gap: 7 (ref 5–15)
BUN: 11 mg/dL (ref 6–20)
CO2: 29 mmol/L (ref 22–32)
Calcium: 8.8 mg/dL — ABNORMAL LOW (ref 8.9–10.3)
Chloride: 104 mmol/L (ref 98–111)
Creatinine: 0.82 mg/dL (ref 0.44–1.00)
GFR, Estimated: >60 mL/min (ref >60)
Glucose, Bld: 83 mg/dL (ref 70–99)
Potassium: 3.8 mmol/L (ref 3.5–5.1)
Sodium: 140 mmol/L (ref 135–145)
Total Bilirubin: 0.8 mg/dL (ref 0.3–1.2)
Total Protein: 7.1 g/dL (ref 6.5–8.1)

## 2020-12-19 LAB — CBC WITH DIFFERENTIAL (CANCER CENTER ONLY)
Abs Immature Granulocytes: 0.01 10*3/uL (ref 0.00–0.07)
Basophils Absolute: 0 10*3/uL (ref 0.0–0.1)
Basophils Relative: 1 %
Eosinophils Absolute: 0.1 10*3/uL (ref 0.0–0.5)
Eosinophils Relative: 3 %
HCT: 35.3 % — ABNORMAL LOW (ref 36.0–46.0)
Hemoglobin: 11.3 g/dL — ABNORMAL LOW (ref 12.0–15.0)
Immature Granulocytes: 0 %
Lymphocytes Relative: 26 %
Lymphs Abs: 0.8 10*3/uL (ref 0.7–4.0)
MCH: 30.1 pg (ref 26.0–34.0)
MCHC: 32 g/dL (ref 30.0–36.0)
MCV: 93.9 fL (ref 80.0–100.0)
Monocytes Absolute: 0.5 10*3/uL (ref 0.1–1.0)
Monocytes Relative: 15 %
Neutro Abs: 1.8 10*3/uL (ref 1.7–7.7)
Neutrophils Relative %: 55 %
Platelet Count: 201 10*3/uL (ref 150–400)
RBC: 3.76 MIL/uL — ABNORMAL LOW (ref 3.87–5.11)
RDW: 13.2 % (ref 11.5–15.5)
WBC Count: 3.2 10*3/uL — ABNORMAL LOW (ref 4.0–10.5)
nRBC: 0 % (ref 0.0–0.2)

## 2020-12-19 MED ORDER — HEPARIN SOD (PORK) LOCK FLUSH 100 UNIT/ML IV SOLN
500.0000 [IU] | Freq: Once | INTRAVENOUS | Status: AC | PRN
  Administered 2020-12-19: 500 [IU]
  Filled 2020-12-19: qty 5

## 2020-12-19 MED ORDER — DIPHENHYDRAMINE HCL 25 MG PO CAPS
ORAL_CAPSULE | ORAL | Status: AC
  Filled 2020-12-19: qty 2

## 2020-12-19 MED ORDER — ACETAMINOPHEN 325 MG PO TABS
ORAL_TABLET | ORAL | Status: AC
  Filled 2020-12-19: qty 2

## 2020-12-19 MED ORDER — DIPHENHYDRAMINE HCL 25 MG PO CAPS
50.0000 mg | ORAL_CAPSULE | Freq: Once | ORAL | Status: AC
  Administered 2020-12-19: 50 mg via ORAL

## 2020-12-19 MED ORDER — TRASTUZUMAB-DKST CHEMO 150 MG IV SOLR
750.0000 mg | Freq: Once | INTRAVENOUS | Status: AC
  Administered 2020-12-19: 750 mg via INTRAVENOUS
  Filled 2020-12-19: qty 35.72

## 2020-12-19 MED ORDER — SODIUM CHLORIDE 0.9% FLUSH
10.0000 mL | Freq: Once | INTRAVENOUS | Status: AC
  Administered 2020-12-19: 10 mL via INTRAVENOUS
  Filled 2020-12-19: qty 10

## 2020-12-19 MED ORDER — SODIUM CHLORIDE 0.9 % IV SOLN
Freq: Once | INTRAVENOUS | Status: AC
  Filled 2020-12-19: qty 250

## 2020-12-19 MED ORDER — DIVALPROEX SODIUM 500 MG PO DR TAB: 500.0000 mg | DELAYED_RELEASE_TABLET | Freq: Two times a day (BID) | ORAL | 3 refills | Status: DC

## 2020-12-19 MED ORDER — ACETAMINOPHEN 325 MG PO TABS
650.0000 mg | ORAL_TABLET | Freq: Once | ORAL | Status: AC
  Administered 2020-12-19: 650 mg via ORAL

## 2020-12-19 MED ORDER — SODIUM CHLORIDE 0.9% FLUSH
10.0000 mL | INTRAVENOUS | Status: DC | PRN
  Administered 2020-12-19: 10 mL
  Filled 2020-12-19: qty 10

## 2020-12-19 NOTE — Progress Notes (Signed)
[facility] at [facility] [address]  [place], [place] [address] [phone]   Interval Evaluation  Date of Service: 12/19/20 Patient Name: Jemeka Wagler Patient MRN: [id number] Patient DOB: 30-Jul-1966 Provider: Henreitta Leber, MD  Identifying Statement:  Caelie Remsburg is a 55 y.o. 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 BRCA gene testing was negative   08/06/2012 Surgery   Bilateral mastectomies:  Right breast: Invasive ductal carcinoma, 0.7cm, grade 2, with extensive DCIS, clear margins, 8/11 lymph nodes positive, measuring up to 3.8cm,  Left breast, no evidence of malignancy and 6 lymph nodes negative for carcinoma.    2014 - 2015 Chemotherapy   adjuvant chemotherapy with 4 cycles of dose dense Adriamycin and Cytoxan followed by 4 cycles of Taxol Herceptin and then Herceptin maintenance. She underwent right chest radiation.       12/24/2013 Relapse/Recurrence   Dizziness, headaches, ataxia, and memory loss. Head CT showed a 1.8cm and a 2.0cm frontal lobe masses. She underwent resection of both masses on 01/07/14,: Moderately differentiated carcinoma, likely of breast origin, HER-2 positive, ER/PR negative, CK7 positive.   2015 - 2015 Radiation Therapy   Received whole brain radiation    Chemotherapy   Taxotere, Herceptin and Perjeta, but Taxotere was discontinued after 4 cycles       11/21/2020 -  Chemotherapy      Patient is on Antibody Plan: BREAST TRASTUZUMAB Q21D     CNS Oncologic History 2015: L frontal craniotomy followed by WBRT at [facility] in [place]  Interval History:  Tykeshia Tourangeau presents today for headache follow up.  She describes continuation  of symptoms, not improved.  Still experiencing at least 2 fulminant migraines per week.  She has been dosing Tylenol for the pain, but not early in the syndrome as we had advised.  No other new complaints, she is due for herceptin today as well with Dr. Pamelia Hoit.  H+P (10/24/20) Patient presents today to review and assess neurologic complaints.  Much of history was provided by sister present at bedside.  She describes chronic short term memory loss, fatigue, and depression symptoms since undergoing whole brain radiation for metastatic breast cancer in 2015.  She also describes difficulty with communication, forming sentences and getting words out properly/fluidly.  Also describes twice weekly migraine headaches associated with nausea/vomiting, photophobia.  She will take a Tylenol or Tramodol late in the headache course, with minimal efficacy. Currently living with family in [place] after living most of life in [place] area.  Needs assistance with many ADLs because of cognitive impairment, physical disability secondary to chronic pain.  Her sleep pattern is very irregular.       Medications: Current Outpatient Medications on File Prior to Visit  Medication Sig Dispense Refill  . acetaminophen (TYLENOL) 500 MG tablet Take 1,000 mg by mouth every 6 (six) hours as needed.    . DULoxetine (CYMBALTA) 60 MG capsule Take 1 capsule (60 mg total) by mouth daily. 90 capsule 1  . gabapentin (NEURONTIN) 300 MG capsule Take 300 mg by mouth 3 (three) times daily.    . pantoprazole (PROTONIX) 40 MG tablet Take 1 tablet (40 mg total) by mouth daily. 90 tablet 0   No current facility-administered medications on  file prior to visit.    Allergies:  Allergies  Allergen Reactions  . Dilaudid [Hydromorphone Hcl] Other (See Comments)    Confusion & hallucinations  . Morphine And Related Other (See Comments)    HA  . Penicillins   . Tramadol    Past Medical History:  Past Medical History:  Diagnosis Date  .  Anxiety   . Breast cancer metastasized to brain [facility])    Left breast  . Chronic pain after cancer treatment   . Depression   . GERD (gastroesophageal reflux disease)   . History of cancer chemotherapy    Breast cancer left breast  . Seizure [facility])    Past Surgical History:  Past Surgical History:  Procedure Laterality Date  . BRAIN SURGERY     brain tumor  . BREAST SURGERY     Social History:  Social History   Socioeconomic History  . Marital status: Single    Spouse name: Not on file  . Number of children: Not on file  . Years of education: Not on file  . Highest education level: Not on file  Occupational History  . Not on file  Tobacco Use  . Smoking status: Never Smoker  . Smokeless tobacco: Never Used  Vaping Use  . Vaping Use: Never used  Substance and Sexual Activity  . Alcohol use: Yes    Comment: On occasion  . Drug use: Never  . Sexual activity: Not Currently  Other Topics Concern  . Not on file  Social History Narrative  . Not on file   Social Determinants of Health   Financial Resource Strain: Medium Risk  . Difficulty of Paying Living Expenses: Somewhat hard  Food Insecurity: Food Insecurity Present  . Worried About Charity fundraiser in the Last Year: Sometimes true  . Ran Out of Food in the Last Year: Never true  Transportation Needs: No Transportation Needs  . Lack of Transportation (Medical): No  . Lack of Transportation (Non-Medical): No  Physical Activity: Insufficiently Active  . Days of Exercise per Week: 1 day  . Minutes of Exercise per Session: 30 min  Stress: No Stress Concern Present  . Feeling of Stress : Only a little  Social Connections: Socially Isolated  . Frequency of Communication with Friends and Family: More than three times a week  . Frequency of Social Gatherings with Friends and Family: Once a week  . Attends Religious Services: Never  . Active Member of Clubs or Organizations: No  . Attends Archivist  Meetings: Never  . Marital Status: Never married  Intimate Partner Violence: Not At Risk  . Fear of Current or Ex-Partner: No  . Emotionally Abused: No  . Physically Abused: No  . Sexually Abused: No   Family History: No family history on file.  Review of Systems: Constitutional: Doesn't report fevers, chills or abnormal weight loss Eyes: Doesn't report blurriness of vision Ears, nose, mouth, throat, and face: Doesn't report sore throat Respiratory: Doesn't report cough, dyspnea or wheezes Cardiovascular: Doesn't report palpitation, chest discomfort  Gastrointestinal:  Doesn't report nausea, constipation, diarrhea GU: Doesn't report incontinence Skin: Doesn't report skin rashes Neurological: Per HPI Musculoskeletal: Doesn't report joint pain Behavioral/Psych: Doesn't report anxiety  Physical Exam: Vitals:   12/19/20 0955  BP: (!) 150/93  Pulse: (!) 58  Resp: 18  Temp: 98.4 F (36.9 C)  SpO2: 100%   KPS: 60. General: Alert, cooperative, pleasant, in no acute distress Head: Normal EENT: No conjunctival injection or  scleral icterus.  Lungs: Resp effort normal Cardiac: Regular rate Abdomen: Non-distended abdomen Skin: No rashes cyanosis or petechiae. Extremities: No clubbing or edema  Neurologic Exam: Mental Status: Awake, alert, attentive to examiner. Oriented to self and environment. Language is noted for impairment in fluency, and frequent paraphasic errors.  Age advanced psychomotor slowing. Cranial Nerves: Visual acuity is grossly normal. Visual fields are full. Extra-ocular movements intact. No ptosis. Face is symmetric Motor: Tone and bulk are normal. Power is full in both arms and legs. Reflexes are symmetric, no pathologic reflexes present.  Sensory: Intact to light touch Gait: Deferred, orthopedic limitation  Labs: I have reviewed the data as listed    Component Value Date/Time   NA 142 11/21/2020 1158   NA 139 12/15/2019 0000   K 3.9 11/21/2020 1158    CL 108 11/21/2020 1158   CO2 24 11/21/2020 1158   GLUCOSE 113 (H) 11/21/2020 1158   BUN 8 11/21/2020 1158   BUN 10 12/15/2019 0000   CREATININE 0.78 11/21/2020 1158   CALCIUM 8.5 (L) 11/21/2020 1158   PROT 7.1 11/21/2020 1158   ALBUMIN 3.5 11/21/2020 1158   AST 12 (L) 11/21/2020 1158   ALT 8 11/21/2020 1158   ALKPHOS 84 11/21/2020 1158   BILITOT 0.7 11/21/2020 1158   GFRNONAA >60 11/21/2020 1158   Lab Results  Component Value Date   WBC 3.2 (L) 12/19/2020   NEUTROABS 1.8 12/19/2020   HGB 11.3 (L) 12/19/2020   HCT 35.3 (L) 12/19/2020   MCV 93.9 12/19/2020   PLT 201 12/19/2020     Assessment/Plan Migraine without aura and without status migrainosus, not intractable  Ashawna Strnad presents again today with syndrome consistent with classical migraine without aura.  We discussed headache prophylaxis given frequency and intensity of episodes.  We feel Depakote would be the best option, because of the following: -Already using anti-depressant -Low baseline heart-rate, going back several months (58 today) -Poor cognitive reserve -Presence of mood lability, described today -Self DC'd AED therapy without physician consent  Will start with $RemoveBe'500mg'vNIqiocAn$  BID of Depakote.  Counseled on monitoring for liver and bone marrow toxicity.  Also discussed using Ibuprofen or Naprosyn for breakthrough headaches, and to treat earlier in the headache syndrome.   We appreciate the opportunity to participate in the care of [facility].  She should return to clinic in ~2 months for further headache evaluation, or sooner if needed.  All questions were answered. The patient knows to call the clinic with any problems, questions or concerns. No barriers to learning were detected.  The total time spent in the encounter was 40 minutes and more than 50% was on counseling and review of test results   Ventura Sellers, MD Medical Director of Neuro-Oncology [facility] at [place] 12/19/20  10:51 AM

## 2020-12-19 NOTE — Assessment & Plan Note (Signed)
Moved to [facility] from [employer] [employer] in 02/2020 to live with her parents and son near her sister.  Prior treatment: Patient was on Herceptin and Perjeta maintenance until June 2021 Because of insurance issues she has not had treatment in the last 7 months.  1.Severe recurrent headaches: Could be related to prior brain radiation.  10/21/2020 brain MRI with and without contrast: No evidence of intracranial metastatic disease.  Postoperative changes left frontal encephalomalacia/gliosis 2. Severe bilateral chest discomfort without any palpable lesions: 10/20/2020: CT CAP: Post Op changes in Rt chest wall. Multilob uterus.   She has an appointment to see Dr. Mickeal Skinner to address some of her brain related concerns.  Current treatment: Herceptin maintenance every 4 weeks started 11/21/2020

## 2020-12-19 NOTE — Patient Instructions (Signed)
[facility] Discharge Instructions for Patients Receiving Chemotherapy  Today you received the following immunotherapy agent: Trastuzumab-dkst (Ogivri).  To help prevent nausea and vomiting after your treatment, we encourage you to take your nausea medication as directed by your MD.    If you develop nausea and vomiting that is not controlled by your nausea medication, call the clinic.   BELOW ARE SYMPTOMS THAT SHOULD BE REPORTED IMMEDIATELY:  *FEVER GREATER THAN 100.5 F  *CHILLS WITH OR WITHOUT FEVER  NAUSEA AND VOMITING THAT IS NOT CONTROLLED WITH YOUR NAUSEA MEDICATION  *UNUSUAL SHORTNESS OF BREATH  *UNUSUAL BRUISING OR BLEEDING  TENDERNESS IN MOUTH AND THROAT WITH OR WITHOUT PRESENCE OF ULCERS  *URINARY PROBLEMS  *BOWEL PROBLEMS  UNUSUAL RASH Items with * indicate a potential emergency and should be followed up as soon as possible.  Feel free to call the clinic should you have any questions or concerns. The clinic phone number is [phone].  Please show the [place] at check-in to the Emergency Department and triage nurse.

## 2020-12-23 ENCOUNTER — Encounter: Payer: Self-pay | Admitting: Family Medicine

## 2020-12-23 ENCOUNTER — Other Ambulatory Visit: Payer: Self-pay

## 2020-12-23 ENCOUNTER — Ambulatory Visit (INDEPENDENT_AMBULATORY_CARE_PROVIDER_SITE_OTHER): Payer: Medicare Other | Admitting: Family Medicine

## 2020-12-23 DIAGNOSIS — H543 Unqualified visual loss, both eyes: Secondary | ICD-10-CM

## 2020-12-23 DIAGNOSIS — R350 Frequency of micturition: Secondary | ICD-10-CM

## 2020-12-23 NOTE — Progress Notes (Signed)
Monica Mccoy is a 54 y.o. female  Chief Complaint  Patient presents with  . Urinary Frequency    HPI: Monica Mccoy is a 55 y.o. female patient accompanied by her sister and she complains of urinary frequency x 1 year. She saw a urologist in [place], had urodynamic testing and was tried on a few meds. One of these was effective. She is unsure of name of med but sister believes she has this info at home. + incontinence. No dysuria. + urgency. No gross hematuria.   She is requesting referral to eye doctor. This was placed by me last week to [facility].   Past Medical History:  Diagnosis Date  . Anxiety   . Breast cancer metastasized to brain [facility])    Left breast  . Chronic pain after cancer treatment   . Depression   . GERD (gastroesophageal reflux disease)   . History of cancer chemotherapy    Breast cancer left breast  . Seizure [facility])     Past Surgical History:  Procedure Laterality Date  . BRAIN SURGERY     brain tumor  . BREAST SURGERY      Social History   Socioeconomic History  . Marital status: Single    Spouse name: Not on file  . Number of children: Not on file  . Years of education: Not on file  . Highest education level: Not on file  Occupational History  . Not on file  Tobacco Use  . Smoking status: Never Smoker  . Smokeless tobacco: Never Used  Vaping Use  . Vaping Use: Never used  Substance and Sexual Activity  . Alcohol use: Yes    Comment: On occasion  . Drug use: Never  . Sexual activity: Not Currently  Other Topics Concern  . Not on file  Social History Narrative  . Not on file   Social Determinants of Health   Financial Resource Strain: Medium Risk  . Difficulty of Paying Living Expenses: Somewhat hard  Food Insecurity: Food Insecurity Present  . Worried About Charity fundraiser in the Last Year: Sometimes true  . Ran Out of Food in the Last Year: Never true  Transportation Needs: No Transportation Needs  . Lack of  Transportation (Medical): No  . Lack of Transportation (Non-Medical): No  Physical Activity: Insufficiently Active  . Days of Exercise per Week: 1 day  . Minutes of Exercise per Session: 30 min  Stress: No Stress Concern Present  . Feeling of Stress : Only a little  Social Connections: Socially Isolated  . Frequency of Communication with Friends and Family: More than three times a week  . Frequency of Social Gatherings with Friends and Family: Once a week  . Attends Religious Services: Never  . Active Member of Clubs or Organizations: No  . Attends Archivist Meetings: Never  . Marital Status: Never married  Intimate Partner Violence: Not At Risk  . Fear of Current or Ex-Partner: No  . Emotionally Abused: No  . Physically Abused: No  . Sexually Abused: No    History reviewed. No pertinent family history.   Immunization History  Administered Date(s) Administered  . Moderna Sars-Covid-2 Vaccination 12/19/2019, 01/19/2020    Outpatient Encounter Medications as of 12/23/2020  Medication Sig  . acetaminophen (TYLENOL) 500 MG tablet Take 1,000 mg by mouth every 6 (six) hours as needed.  . divalproex (DEPAKOTE) 500 MG DR tablet Take 1 tablet (500 mg total) by mouth 2 (two) times daily.  Marland Kitchen  DULoxetine (CYMBALTA) 60 MG capsule Take 1 capsule (60 mg total) by mouth daily.  Marland Kitchen gabapentin (NEURONTIN) 300 MG capsule Take 300 mg by mouth 3 (three) times daily.  . pantoprazole (PROTONIX) 40 MG tablet Take 1 tablet (40 mg total) by mouth daily.   No facility-administered encounter medications on file as of 12/23/2020.     ROS: Pertinent positives and negatives noted in HPI. Remainder of ROS non-contributory   Allergies  Allergen Reactions  . Dilaudid [Hydromorphone Hcl] Other (See Comments)    Confusion & hallucinations  . Morphine And Related Other (See Comments)    HA  . Penicillins   . Tramadol     There were no vitals taken for this visit. Wt Readings from Last 3  Encounters:  12/19/20 288 lb 14.4 oz (131 kg)  12/19/20 288 lb 9.6 oz (130.9 kg)  11/21/20 284 lb 12.8 oz (129.2 kg)   Temp Readings from Last 3 Encounters:  12/19/20 97.7 F (36.5 C) (Tympanic)  12/19/20 98.4 F (36.9 C) (Tympanic)  11/21/20 98.1 F (36.7 C) (Oral)   BP Readings from Last 3 Encounters:  12/19/20 124/77  12/19/20 (!) 150/93  11/21/20 (!) 149/93   Pulse Readings from Last 3 Encounters:  12/19/20 62  12/19/20 (!) 58  11/21/20 70    Physical Exam Constitutional:      Appearance: She is obese.  Abdominal:     General: Bowel sounds are normal. There is no distension.     Palpations: Abdomen is soft.     Tenderness: There is no abdominal tenderness. There is no right CVA tenderness, left CVA tenderness, guarding or rebound.  Neurological:     Mental Status: She is alert. Mental status is at baseline.  Psychiatric:        Behavior: Behavior normal.      A/P:  1. Urinary frequency - symptoms x 1+ year, saw urology in [place] and had urodynamic testing done. Pt is unsure of diagnosis and also the name of the medication that was effective at controlling her symptoms. Sister thinks she has the med information at home and will call office with this. If not, will try myrbetriq  - pt unable to give urine sample today  2. Decreased vision in both eyes - referral placed previously to [facility] - sister will call to schedule appt    This visit occurred during the SARS-CoV-2 public health emergency.  Safety protocols were in place, including screening questions prior to the visit, additional usage of staff PPE, and extensive cleaning of exam room while observing appropriate contact time as indicated for disinfecting solutions.

## 2020-12-23 NOTE — Patient Instructions (Addendum)
[place]: [email or web address]  [email or web address]  [email or web address] [email or web address]  [facility] [email or web address]  [email or web address]   [facility] [place], [place], [place] [address] Phone: [phone] Referral was placed 4/21 and sent 12/16/20

## 2021-01-13 ENCOUNTER — Other Ambulatory Visit: Payer: Self-pay

## 2021-01-13 DIAGNOSIS — C50911 Malignant neoplasm of unspecified site of right female breast: Secondary | ICD-10-CM

## 2021-01-16 ENCOUNTER — Encounter: Payer: Self-pay | Admitting: Hematology and Oncology

## 2021-01-16 ENCOUNTER — Inpatient Hospital Stay: Payer: Medicare Other

## 2021-01-16 ENCOUNTER — Other Ambulatory Visit: Payer: Self-pay | Admitting: Hematology and Oncology

## 2021-01-16 ENCOUNTER — Inpatient Hospital Stay: Payer: Medicare Other | Attending: Internal Medicine

## 2021-01-16 ENCOUNTER — Other Ambulatory Visit: Payer: Medicare Other

## 2021-01-16 ENCOUNTER — Other Ambulatory Visit: Payer: Self-pay

## 2021-01-16 VITALS — BP 146/86 | HR 61 | Temp 97.9°F | Resp 18

## 2021-01-16 DIAGNOSIS — C50911 Malignant neoplasm of unspecified site of right female breast: Secondary | ICD-10-CM

## 2021-01-16 DIAGNOSIS — Z5112 Encounter for antineoplastic immunotherapy: Secondary | ICD-10-CM | POA: Diagnosis present

## 2021-01-16 DIAGNOSIS — Z95828 Presence of other vascular implants and grafts: Secondary | ICD-10-CM

## 2021-01-16 DIAGNOSIS — C7931 Secondary malignant neoplasm of brain: Secondary | ICD-10-CM | POA: Insufficient documentation

## 2021-01-16 DIAGNOSIS — Z853 Personal history of malignant neoplasm of breast: Secondary | ICD-10-CM

## 2021-01-16 DIAGNOSIS — C50411 Malignant neoplasm of upper-outer quadrant of right female breast: Secondary | ICD-10-CM | POA: Diagnosis present

## 2021-01-16 LAB — CBC WITH DIFFERENTIAL (CANCER CENTER ONLY)
Abs Immature Granulocytes: 0.01 10*3/uL (ref 0.00–0.07)
Basophils Absolute: 0 10*3/uL (ref 0.0–0.1)
Basophils Relative: 1 %
Eosinophils Absolute: 0.1 10*3/uL (ref 0.0–0.5)
Eosinophils Relative: 3 %
HCT: 38.4 % (ref 36.0–46.0)
Hemoglobin: 12.5 g/dL (ref 12.0–15.0)
Immature Granulocytes: 0 %
Lymphocytes Relative: 28 %
Lymphs Abs: 1 10*3/uL (ref 0.7–4.0)
MCH: 30.2 pg (ref 26.0–34.0)
MCHC: 32.6 g/dL (ref 30.0–36.0)
MCV: 92.8 fL (ref 80.0–100.0)
Monocytes Absolute: 0.5 10*3/uL (ref 0.1–1.0)
Monocytes Relative: 14 %
Neutro Abs: 2.1 10*3/uL (ref 1.7–7.7)
Neutrophils Relative %: 54 %
Platelet Count: 187 10*3/uL (ref 150–400)
RBC: 4.14 MIL/uL (ref 3.87–5.11)
RDW: 13.2 % (ref 11.5–15.5)
WBC Count: 3.8 10*3/uL — ABNORMAL LOW (ref 4.0–10.5)
nRBC: 0 % (ref 0.0–0.2)

## 2021-01-16 LAB — CMP (CANCER CENTER ONLY)
ALT: 7 U/L (ref 0–44)
AST: 14 U/L — ABNORMAL LOW (ref 15–41)
Albumin: 3.7 g/dL (ref 3.5–5.0)
Alkaline Phosphatase: 77 U/L (ref 38–126)
Anion gap: 9 (ref 5–15)
BUN: 13 mg/dL (ref 6–20)
CO2: 30 mmol/L (ref 22–32)
Calcium: 9.7 mg/dL (ref 8.9–10.3)
Chloride: 103 mmol/L (ref 98–111)
Creatinine: 0.86 mg/dL (ref 0.44–1.00)
GFR, Estimated: >60 mL/min (ref >60)
Glucose, Bld: 89 mg/dL (ref 70–99)
Potassium: 3.8 mmol/L (ref 3.5–5.1)
Sodium: 142 mmol/L (ref 135–145)
Total Bilirubin: 0.6 mg/dL (ref 0.3–1.2)
Total Protein: 7.5 g/dL (ref 6.5–8.1)

## 2021-01-16 MED ORDER — DIPHENHYDRAMINE HCL 25 MG PO CAPS
50.0000 mg | ORAL_CAPSULE | Freq: Once | ORAL | Status: AC
  Administered 2021-01-16: 25 mg via ORAL

## 2021-01-16 MED ORDER — DIPHENHYDRAMINE HCL 25 MG PO CAPS
ORAL_CAPSULE | ORAL | Status: AC
  Filled 2021-01-16: qty 2

## 2021-01-16 MED ORDER — ALTEPLASE 2 MG IJ SOLR
2.0000 mg | Freq: Once | INTRAMUSCULAR | Status: AC | PRN
Start: 2021-01-16 — End: 2021-01-16
  Administered 2021-01-16: 2 mg
  Filled 2021-01-16: qty 2

## 2021-01-16 MED ORDER — SODIUM CHLORIDE 0.9 % IV SOLN
Freq: Once | INTRAVENOUS | Status: AC
  Filled 2021-01-16: qty 250

## 2021-01-16 MED ORDER — ACETAMINOPHEN 325 MG PO TABS: 325.0000 mg | ORAL_TABLET | Freq: Once | ORAL | Status: DC

## 2021-01-16 MED ORDER — OXYCODONE HCL 5 MG PO TABS
5.0000 mg | ORAL_TABLET | Freq: Once | ORAL | Status: AC
  Administered 2021-01-16: 5 mg via ORAL

## 2021-01-16 MED ORDER — TRAMADOL HCL 50 MG PO TABS: 50.0000 mg | ORAL_TABLET | Freq: Two times a day (BID) | ORAL | 0 refills | Status: DC | PRN

## 2021-01-16 MED ORDER — SODIUM CHLORIDE 0.9% FLUSH
10.0000 mL | INTRAVENOUS | Status: DC | PRN
  Administered 2021-01-16: 10 mL
  Filled 2021-01-16: qty 10

## 2021-01-16 MED ORDER — SODIUM CHLORIDE 0.9% FLUSH
10.0000 mL | INTRAVENOUS | Status: AC | PRN
  Administered 2021-01-16: 10 mL
  Filled 2021-01-16: qty 10

## 2021-01-16 MED ORDER — ALTEPLASE 2 MG IJ SOLR
INTRAMUSCULAR | Status: AC
  Filled 2021-01-16: qty 2

## 2021-01-16 MED ORDER — OXYCODONE HCL 5 MG PO TABS
ORAL_TABLET | ORAL | Status: AC
  Filled 2021-01-16: qty 1

## 2021-01-16 MED ORDER — ACETAMINOPHEN 325 MG PO TABS
650.0000 mg | ORAL_TABLET | Freq: Once | ORAL | Status: AC
  Administered 2021-01-16: 650 mg via ORAL

## 2021-01-16 MED ORDER — ACETAMINOPHEN 325 MG PO TABS
ORAL_TABLET | ORAL | Status: AC
  Filled 2021-01-16: qty 2

## 2021-01-16 MED ORDER — HEPARIN SOD (PORK) LOCK FLUSH 100 UNIT/ML IV SOLN
500.0000 [IU] | Freq: Once | INTRAVENOUS | Status: AC | PRN
  Administered 2021-01-16: 500 [IU]
  Filled 2021-01-16: qty 5

## 2021-01-16 MED ORDER — TRASTUZUMAB-DKST CHEMO 150 MG IV SOLR
750.0000 mg | Freq: Once | INTRAVENOUS | Status: AC
  Administered 2021-01-16: 750 mg via INTRAVENOUS
  Filled 2021-01-16: qty 35.72

## 2021-01-16 NOTE — Patient Instructions (Addendum)
Per MD, please pick up RX for Tramadol at pharmacy for perineal pain.  May use sitz bath & preparation H at home.  Take over the counter stool softeners if needed for constipation.  Please see your surgeon to discuss if this does not improve.    [facility] Discharge Instructions for Patients Receiving Chemotherapy  Today you received the following immunotherapy agent: Trastuzumab-dkst (Ogivri).  To help prevent nausea and vomiting after your treatment, we encourage you to take your nausea medication as directed by your MD.    If you develop nausea and vomiting that is not controlled by your nausea medication, call the clinic.   BELOW ARE SYMPTOMS THAT SHOULD BE REPORTED IMMEDIATELY: *FEVER GREATER THAN 100.5 F *CHILLS WITH OR WITHOUT FEVER NAUSEA AND VOMITING THAT IS NOT CONTROLLED WITH YOUR NAUSEA MEDICATION *UNUSUAL SHORTNESS OF BREATH *UNUSUAL BRUISING OR BLEEDING TENDERNESS IN MOUTH AND THROAT WITH OR WITHOUT PRESENCE OF ULCERS *URINARY PROBLEMS *BOWEL PROBLEMS UNUSUAL RASH Items with * indicate a potential emergency and should be followed up as soon as possible.  Feel free to call the clinic should you have any questions or concerns. The clinic phone number is [phone].  Please show the CHEMO ALERT CARD at check-in to the Emergency Department and triage nurse.   

## 2021-01-16 NOTE — Progress Notes (Signed)
No blood return from [facility] after 3 hrs of cathflow.  MD notified.

## 2021-01-17 ENCOUNTER — Telehealth: Payer: Self-pay | Admitting: Family Medicine

## 2021-01-17 NOTE — Telephone Encounter (Signed)
I tried to call pt and see if she still needs referral, whoever answered the phone said she wasn't in at the moment but would have her call back

## 2021-02-03 ENCOUNTER — Ambulatory Visit (INDEPENDENT_AMBULATORY_CARE_PROVIDER_SITE_OTHER): Payer: Medicare Other | Admitting: Family Medicine

## 2021-02-03 ENCOUNTER — Encounter: Payer: Self-pay | Admitting: Family Medicine

## 2021-02-03 ENCOUNTER — Other Ambulatory Visit: Payer: Self-pay

## 2021-02-03 VITALS — BP 104/86 | HR 72 | Temp 97.4°F | Ht 65.75 in | Wt 274.4 lb

## 2021-02-03 DIAGNOSIS — Z09 Encounter for follow-up examination after completed treatment for conditions other than malignant neoplasm: Secondary | ICD-10-CM | POA: Diagnosis not present

## 2021-02-03 DIAGNOSIS — H543 Unqualified visual loss, both eyes: Secondary | ICD-10-CM

## 2021-02-03 DIAGNOSIS — R32 Unspecified urinary incontinence: Secondary | ICD-10-CM

## 2021-02-03 MED ORDER — MIRABEGRON ER 50 MG PO TB24: 50.0000 mg | ORAL_TABLET | Freq: Every day | ORAL | 1 refills | Status: DC

## 2021-02-03 NOTE — Patient Instructions (Signed)
  [facility] [place], [place], [place] [address] [phone] [email or web address]  [facility] [address] [place], [place] [address] [phone] [email or web address]

## 2021-02-03 NOTE — Progress Notes (Signed)
Monica Mccoy is a 55 y.o. female  Chief Complaint  Patient presents with   Hospitalization Follow-up    Hospital in [place], [facility]) for gallstones, staying for 2days.  June 6,2022-7th.  Pt c/o frequent urination, wetting her bed at night.  Pt would like new referrals from last time.    HPI: Monica Mccoy is a 55 y.o. female patient accompanied by her sister.  Pt was hospitalized [facility]) in [place] 6/6-01/31/21 for "gallstones".  She was Rx'd flagyl and cipro but is not taking it.  She went to ER with abdominal pain, n/v.   Pt also frequent urination, wetting bed at night. She needs referral for urology. Was Rx'd a med in [place] but still is not sure of name.  urinary frequency x 1 year. She saw a urologist in [place], had urodynamic testing and was tried on a few meds. One of these was effective. She is unsure of name of med  She needs ophthalmology referral placed again. Pt never scheduled previous appt and referral was closed.    Past Medical History:  Diagnosis Date   Anxiety    Breast cancer metastasized to brain [facility])    Left breast   Chronic pain after cancer treatment    Depression    GERD (gastroesophageal reflux disease)    History of cancer chemotherapy    Breast cancer left breast   Seizure (HCC)     Past Surgical History:  Procedure Laterality Date   BRAIN SURGERY     brain tumor   BREAST SURGERY      Social History   Socioeconomic History   Marital status: Single    Spouse name: Not on file   Number of children: Not on file   Years of education: Not on file   Highest education level: Not on file  Occupational History   Not on file  Tobacco Use   Smoking status: Never   Smokeless tobacco: Never  Vaping Use   Vaping Use: Never used  Substance and Sexual Activity   Alcohol use: Yes    Comment: On occasion   Drug use: Never   Sexual activity: Not Currently  Other Topics Concern   Not on  file  Social History Narrative   Not on file   Social Determinants of Health   Financial Resource Strain: Medium Risk   Difficulty of Paying Living Expenses: Somewhat hard  Food Insecurity: Food Insecurity Present   Worried About Running Out of Food in the Last Year: Sometimes true   Ran Out of Food in the Last Year: Never true  Transportation Needs: No Transportation Needs   Lack of Transportation (Medical): No   Lack of Transportation (Non-Medical): No  Physical Activity: Insufficiently Active   Days of Exercise per Week: 1 day   Minutes of Exercise per Session: 30 min  Stress: No Stress Concern Present   Feeling of Stress : Only a little  Social Connections: Socially Isolated   Frequency of Communication with Friends and Family: More than three times a week   Frequency of Social Gatherings with Friends and Family: Once a week   Attends Religious Services: Never   Marine scientist or Organizations: No   Attends Music therapist: Never   Marital Status: Never married  Human resources officer Violence: Not At Risk   Fear of Current or Ex-Partner: No   Emotionally Abused: No   Physically Abused: No   Sexually Abused: No  History reviewed. No pertinent family history.   Immunization History  Administered Date(s) Administered   [employer] Vaccination 12/19/2019, 01/19/2020    Outpatient Encounter Medications as of 02/03/2021  Medication Sig   acetaminophen (TYLENOL) 500 MG tablet Take 1,000 mg by mouth every 6 (six) hours as needed.   divalproex (DEPAKOTE) 500 MG DR tablet Take 1 tablet (500 mg total) by mouth 2 (two) times daily.   DULoxetine (CYMBALTA) 60 MG capsule Take 1 capsule (60 mg total) by mouth daily.   gabapentin (NEURONTIN) 300 MG capsule Take 300 mg by mouth 3 (three) times daily.   traMADol (ULTRAM) 50 MG tablet Take 1 tablet (50 mg total) by mouth 2 (two) times daily as needed.   ciprofloxacin (CIPRO) 250 MG tablet Take 500 mg by  mouth 2 (two) times daily. (Patient not taking: Reported on 02/03/2021)   metroNIDAZOLE (FLAGYL) 250 MG tablet Take by mouth. (Patient not taking: Reported on 02/03/2021)   No facility-administered encounter medications on file as of 02/03/2021.     ROS: Pertinent positives and negatives noted in HPI. Remainder of ROS non-contributory  Allergies  Allergen Reactions   Dilaudid [Hydromorphone Hcl] Other (See Comments)    Confusion & hallucinations   Morphine And Related Other (See Comments)    HA   Penicillins    Tramadol     BP 104/86 (BP Location: Left Arm, Patient Position: Sitting, Cuff Size: Normal)   Pulse 72   Temp (!) 97.4 F (36.3 C) (Temporal)   Ht 5' 5.75" (1.67 m)   Wt 274 lb 6.4 oz (124.5 kg)   SpO2 96%   BMI 44.63 kg/m   Physical Exam Constitutional:      Appearance: She is obese.  Abdominal:     General: Bowel sounds are normal. There is no distension.     Palpations: Abdomen is soft.     Tenderness: There is no abdominal tenderness. There is no right CVA tenderness, left CVA tenderness, guarding or rebound.  Neurological:     Mental Status: She is alert. Mental status is at baseline.  Psychiatric:        Mood and Affect: Mood normal.        Behavior: Behavior normal.     A/P:  1. Decreased vision in both eyes - Ambulatory referral to Ophthalmology  2. Urinary incontinence, unspecified type - seen in [place] by urology, had urodynamics, tried on multiple meds (unsure of names and which one was effective) Rx: - mirabegron ER (MYRBETRIQ) 50 MG TB24 tablet; Take 1 tablet (50 mg total) by mouth daily.  Dispense: 90 tablet; Refill: 1 - Ambulatory referral to Urology  3. Hospital discharge follow-up - admitted to [facility] in [place] 6/6-01/31/21. No records available today so pt will sign records release so we can obtain - pt is asymptomatic at this time - recommend pt complete course of abxs as Rx'd    This visit occurred during  the SARS-CoV-2 public health emergency.  Safety protocols were in place, including screening questions prior to the visit, additional usage of staff PPE, and extensive cleaning of exam room while observing appropriate contact time as indicated for disinfecting solutions.

## 2021-02-10 ENCOUNTER — Ambulatory Visit (INDEPENDENT_AMBULATORY_CARE_PROVIDER_SITE_OTHER): Payer: Medicare Other | Admitting: Podiatry

## 2021-02-10 ENCOUNTER — Other Ambulatory Visit: Payer: Self-pay

## 2021-02-10 DIAGNOSIS — B351 Tinea unguium: Secondary | ICD-10-CM

## 2021-02-10 DIAGNOSIS — M2041 Other hammer toe(s) (acquired), right foot: Secondary | ICD-10-CM

## 2021-02-10 DIAGNOSIS — G620 Drug-induced polyneuropathy: Secondary | ICD-10-CM

## 2021-02-10 DIAGNOSIS — M79675 Pain in left toe(s): Secondary | ICD-10-CM

## 2021-02-10 DIAGNOSIS — M2042 Other hammer toe(s) (acquired), left foot: Secondary | ICD-10-CM

## 2021-02-10 DIAGNOSIS — Q828 Other specified congenital malformations of skin: Secondary | ICD-10-CM

## 2021-02-10 DIAGNOSIS — T451X5A Adverse effect of antineoplastic and immunosuppressive drugs, initial encounter: Secondary | ICD-10-CM | POA: Diagnosis not present

## 2021-02-10 DIAGNOSIS — M79674 Pain in right toe(s): Secondary | ICD-10-CM | POA: Diagnosis not present

## 2021-02-11 NOTE — Progress Notes (Signed)
Patient Care Team: Ronnald Nian, DO as PCP - General (Family Medicine) Pcp, No as PCP - Family Medicine  DIAGNOSIS:    ICD-10-CM   1. Cancer of right breast metastatic to brain [facility])  C50.911    C79.31       SUMMARY OF ONCOLOGIC HISTORY: Oncology History  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[place])  06/13/2012 Initial Diagnosis   Right breast biopsy: Invasive ductal carcinoma, grade 3, HER-2 positive (3+), ER/PR negative, Ki67 5-10%, and in the right breast, high grade DCIS. BRCA gene testing was negative   08/06/2012 Surgery   Bilateral mastectomies:  Right breast: Invasive ductal carcinoma, 0.7cm, grade 2, with extensive DCIS, clear margins, 8/11 lymph nodes positive, measuring up to 3.8cm,  Left breast, no evidence of malignancy and 6 lymph nodes negative for carcinoma.    2014 - 2015 Chemotherapy   adjuvant chemotherapy with 4 cycles of dose dense Adriamycin and Cytoxan followed by 4 cycles of Taxol Herceptin and then Herceptin maintenance. She underwent right chest radiation.       12/24/2013 Relapse/Recurrence   Dizziness, headaches, ataxia, and memory loss. Head CT showed a 1.8cm and a 2.0cm frontal lobe masses. She underwent resection of both masses on 01/07/14,: Moderately differentiated carcinoma, likely of breast origin, HER-2 positive, ER/PR negative, CK7 positive.   2015 - 2015 Radiation Therapy   Received whole brain radiation    Chemotherapy   Taxotere, Herceptin and Perjeta, but Taxotere was discontinued after 4 cycles       11/21/2020 -  Chemotherapy      Patient is on Antibody Plan: BREAST TRASTUZUMAB Q21D       CHIEF COMPLIANT: Cycle 4 Herceptin  INTERVAL HISTORY: Monica Mccoy is a 55 y.o. with above-mentioned history of metastatic breast cancer currently on Herceptin. She reports to the clinic today for cycle 4.  She is tolerating Herceptin fairly well without any problems.  She was in [place] when she developed gallbladder  pain and was admitted to the hospital there for couple of days.  They recommended cholecystectomy but she did not want to do it there.  So she is here.  We need to set her up for a surgery consultation.  ALLERGIES:  is allergic to dilaudid [hydromorphone hcl], morphine and related, penicillins, and tramadol.  MEDICATIONS:  Current Outpatient Medications  Medication Sig Dispense Refill   acetaminophen (TYLENOL) 500 MG tablet Take 1,000 mg by mouth every 6 (six) hours as needed.     ciprofloxacin (CIPRO) 250 MG tablet Take 500 mg by mouth 2 (two) times daily. (Patient not taking: Reported on 02/03/2021)     divalproex (DEPAKOTE) 500 MG DR tablet Take 1 tablet (500 mg total) by mouth 2 (two) times daily. 60 tablet 3   DULoxetine (CYMBALTA) 60 MG capsule Take 1 capsule (60 mg total) by mouth daily. 90 capsule 1   gabapentin (NEURONTIN) 300 MG capsule Take 300 mg by mouth 3 (three) times daily.     metroNIDAZOLE (FLAGYL) 250 MG tablet Take by mouth. (Patient not taking: Reported on 02/03/2021)     mirabegron ER (MYRBETRIQ) 50 MG TB24 tablet Take 1 tablet (50 mg total) by mouth daily. 90 tablet 1   traMADol (ULTRAM) 50 MG tablet Take 1 tablet (50 mg total) by mouth 2 (two) times daily as needed. 30 tablet 0   No current facility-administered medications for this visit.    PHYSICAL EXAMINATION: ECOG PERFORMANCE STATUS: 1 - Symptomatic but completely ambulatory  Vitals:  02/13/21 1137  BP: 133/87  Pulse: 72  Resp: 18  Temp: 98 F (36.7 C)  SpO2: 100%   Filed Weights   02/13/21 1137  Weight: 275 lb 14.4 oz (125.1 kg)     LABORATORY DATA:  I have reviewed the data as listed CMP Latest Ref Rng & Units 01/16/2021 12/19/2020 11/21/2020  Glucose 70 - 99 mg/dL 89 83 113(H)  BUN 6 - 20 mg/dL $Remove'13 11 8  'azJeUGI$ Creatinine 0.44 - 1.00 mg/dL 0.86 0.82 0.78  Sodium 135 - 145 mmol/L 142 140 142  Potassium 3.5 - 5.1 mmol/L 3.8 3.8 3.9  Chloride 98 - 111 mmol/L 103 104 108  CO2 22 - 32 mmol/L $RemoveB'30 29 24   'XHoAASyN$ Calcium 8.9 - 10.3 mg/dL 9.7 8.8(L) 8.5(L)  Total Protein 6.5 - 8.1 g/dL 7.5 7.1 7.1  Total Bilirubin 0.3 - 1.2 mg/dL 0.6 0.8 0.7  Alkaline Phos 38 - 126 U/L 77 83 84  AST 15 - 41 U/L 14(L) 10(L) 12(L)  ALT 0 - 44 U/L 7 <6 8    Lab Results  Component Value Date   WBC 3.3 (L) 02/13/2021   HGB 12.4 02/13/2021   HCT 38.3 02/13/2021   MCV 93.6 02/13/2021   PLT 143 (L) 02/13/2021   NEUTROABS 1.6 (L) 02/13/2021    ASSESSMENT & PLAN:  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[place]) Moved to [facility] from [place], [facility]), in 02/2020 to live with her parents and son near her sister. (2013 HER2 positive breast cancer status post bilateral mastectomies and adjuvant chemotherapy with Herceptin, April 2015 brain metastases status post resection and radiation)  Prior treatment: Patient was on Herceptin and Perjeta maintenance until June 2021 Because of insurance issues she has not had treatment for 7 months until she moved to [place].   1. Severe recurrent headaches: Could be related to prior brain radiation.  10/21/2020 brain MRI with and without contrast: No evidence of intracranial metastatic disease.  Postoperative changes left frontal encephalomalacia/gliosis 2. Severe bilateral chest discomfort without any palpable lesions: 10/20/2020: CT CAP: Post Op changes in Rt chest wall. Multilob uterus.   She has an appointment today to see Dr. Mickeal Skinner to address some of her brain related concerns.  We let Dr. Mickeal Skinner decide on the frequency of her brain imaging.  Whole-body scans will be done once a year.   Current treatment: Herceptin maintenance every 4 weeks started 11/21/2020, after the next scans in February 2023 we can decide on increasing the frequency of infusions. Herceptin toxicities: None   Cholecystitis: We will refer her to surgery. After a year of monthly Herceptin as we can switch to every 6 weeks thereafter.    No orders of the defined types  were placed in this encounter.  The patient has a good understanding of the overall plan. she agrees with it. she will call with any problems that may develop before the next visit here.  Total time spent: 30 mins including face to face time and time spent for planning, charting and coordination of care  Rulon Eisenmenger, MD, MPH 02/13/2021  I, Thana Ates, am acting as scribe for Dr. Nicholas Lose.  I have reviewed the above documentation for accuracy and completeness, and I agree with the above.

## 2021-02-12 NOTE — Assessment & Plan Note (Signed)
Moved to [facility] from [employer]), in 02/2020 to live with her parents and son near her sister.  Prior treatment: Patient was on Herceptin and Perjeta maintenance until June 2021 Because of insurance issues she has not had treatment in the last 7 months.  1.Severe recurrent headaches: Could be related to prior brain radiation.2/25/2022brain MRI with and without contrast: No evidence of intracranial metastatic disease. Postoperative changes left frontal encephalomalacia/gliosis 2. Severe bilateral chest discomfort without any palpable lesions: 10/20/2020: CT CAP: Post Op changes in Rt chest wall. Multilob uterus.  She has an appointment to see Dr. Mickeal Skinner to address some of her brain related concerns.  Current treatment: Herceptin maintenance every 4 weeks started 11/21/2020 Herceptin toxicities: None  After a year of monthly Herceptin as we can switch to every 6 weeks thereafter.

## 2021-02-13 ENCOUNTER — Ambulatory Visit: Payer: Medicare Other | Admitting: Hematology and Oncology

## 2021-02-13 ENCOUNTER — Inpatient Hospital Stay: Payer: Medicare Other | Attending: Internal Medicine

## 2021-02-13 ENCOUNTER — Other Ambulatory Visit: Payer: Self-pay

## 2021-02-13 ENCOUNTER — Other Ambulatory Visit: Payer: Medicare Other

## 2021-02-13 ENCOUNTER — Inpatient Hospital Stay: Payer: Medicare Other

## 2021-02-13 ENCOUNTER — Inpatient Hospital Stay (HOSPITAL_BASED_OUTPATIENT_CLINIC_OR_DEPARTMENT_OTHER): Payer: Medicare Other | Admitting: Internal Medicine

## 2021-02-13 ENCOUNTER — Ambulatory Visit: Payer: Medicare Other | Admitting: Internal Medicine

## 2021-02-13 ENCOUNTER — Other Ambulatory Visit: Payer: Self-pay | Admitting: *Deleted

## 2021-02-13 ENCOUNTER — Encounter: Payer: Self-pay | Admitting: Internal Medicine

## 2021-02-13 ENCOUNTER — Ambulatory Visit: Payer: Medicare Other

## 2021-02-13 ENCOUNTER — Inpatient Hospital Stay (HOSPITAL_BASED_OUTPATIENT_CLINIC_OR_DEPARTMENT_OTHER): Payer: Medicare Other | Admitting: Hematology and Oncology

## 2021-02-13 VITALS — BP 133/87 | HR 72 | Temp 98.0°F | Resp 18 | Ht 65.0 in | Wt 275.0 lb

## 2021-02-13 DIAGNOSIS — G43009 Migraine without aura, not intractable, without status migrainosus: Secondary | ICD-10-CM | POA: Insufficient documentation

## 2021-02-13 DIAGNOSIS — Z171 Estrogen receptor negative status [ER-]: Secondary | ICD-10-CM | POA: Insufficient documentation

## 2021-02-13 DIAGNOSIS — C50411 Malignant neoplasm of upper-outer quadrant of right female breast: Secondary | ICD-10-CM | POA: Insufficient documentation

## 2021-02-13 DIAGNOSIS — C7931 Secondary malignant neoplasm of brain: Secondary | ICD-10-CM

## 2021-02-13 DIAGNOSIS — Z95828 Presence of other vascular implants and grafts: Secondary | ICD-10-CM

## 2021-02-13 DIAGNOSIS — Z79899 Other long term (current) drug therapy: Secondary | ICD-10-CM | POA: Diagnosis not present

## 2021-02-13 DIAGNOSIS — Z853 Personal history of malignant neoplasm of breast: Secondary | ICD-10-CM

## 2021-02-13 DIAGNOSIS — C50911 Malignant neoplasm of unspecified site of right female breast: Secondary | ICD-10-CM

## 2021-02-13 DIAGNOSIS — Z5112 Encounter for antineoplastic immunotherapy: Secondary | ICD-10-CM | POA: Insufficient documentation

## 2021-02-13 DIAGNOSIS — Z9013 Acquired absence of bilateral breasts and nipples: Secondary | ICD-10-CM | POA: Insufficient documentation

## 2021-02-13 LAB — CBC WITH DIFFERENTIAL (CANCER CENTER ONLY)
Abs Immature Granulocytes: 0.01 10*3/uL (ref 0.00–0.07)
Basophils Absolute: 0 10*3/uL (ref 0.0–0.1)
Basophils Relative: 1 %
Eosinophils Absolute: 0.1 10*3/uL (ref 0.0–0.5)
Eosinophils Relative: 4 %
HCT: 38.3 % (ref 36.0–46.0)
Hemoglobin: 12.4 g/dL (ref 12.0–15.0)
Immature Granulocytes: 0 %
Lymphocytes Relative: 29 %
Lymphs Abs: 1 10*3/uL (ref 0.7–4.0)
MCH: 30.3 pg (ref 26.0–34.0)
MCHC: 32.4 g/dL (ref 30.0–36.0)
MCV: 93.6 fL (ref 80.0–100.0)
Monocytes Absolute: 0.6 10*3/uL (ref 0.1–1.0)
Monocytes Relative: 17 %
Neutro Abs: 1.6 10*3/uL — ABNORMAL LOW (ref 1.7–7.7)
Neutrophils Relative %: 49 %
Platelet Count: 143 10*3/uL — ABNORMAL LOW (ref 150–400)
RBC: 4.09 MIL/uL (ref 3.87–5.11)
RDW: 13.3 % (ref 11.5–15.5)
WBC Count: 3.3 10*3/uL — ABNORMAL LOW (ref 4.0–10.5)
nRBC: 0 % (ref 0.0–0.2)

## 2021-02-13 LAB — CMP (CANCER CENTER ONLY)
ALT: 8 U/L (ref 0–44)
AST: 16 U/L (ref 15–41)
Albumin: 3.5 g/dL (ref 3.5–5.0)
Alkaline Phosphatase: 53 U/L (ref 38–126)
Anion gap: 10 (ref 5–15)
BUN: 10 mg/dL (ref 6–20)
CO2: 29 mmol/L (ref 22–32)
Calcium: 8.7 mg/dL — ABNORMAL LOW (ref 8.9–10.3)
Chloride: 102 mmol/L (ref 98–111)
Creatinine: 0.98 mg/dL (ref 0.44–1.00)
GFR, Estimated: >60 mL/min (ref >60)
Glucose, Bld: 96 mg/dL (ref 70–99)
Potassium: 3.7 mmol/L (ref 3.5–5.1)
Sodium: 141 mmol/L (ref 135–145)
Total Bilirubin: 0.6 mg/dL (ref 0.3–1.2)
Total Protein: 6.9 g/dL (ref 6.5–8.1)

## 2021-02-13 MED ORDER — SODIUM CHLORIDE 0.9% FLUSH
10.0000 mL | INTRAVENOUS | Status: DC | PRN
  Administered 2021-02-13: 10 mL
  Filled 2021-02-13: qty 10

## 2021-02-13 MED ORDER — TRASTUZUMAB-DKST CHEMO 150 MG IV SOLR
750.0000 mg | Freq: Once | INTRAVENOUS | Status: AC
  Administered 2021-02-13: 750 mg via INTRAVENOUS
  Filled 2021-02-13: qty 35.72

## 2021-02-13 MED ORDER — DIPHENHYDRAMINE HCL 25 MG PO CAPS
ORAL_CAPSULE | ORAL | Status: AC
  Filled 2021-02-13: qty 2

## 2021-02-13 MED ORDER — HEPARIN SOD (PORK) LOCK FLUSH 100 UNIT/ML IV SOLN
500.0000 [IU] | Freq: Once | INTRAVENOUS | Status: AC | PRN
  Administered 2021-02-13: 500 [IU]
  Filled 2021-02-13: qty 5

## 2021-02-13 MED ORDER — ACETAMINOPHEN 325 MG PO TABS
650.0000 mg | ORAL_TABLET | Freq: Once | ORAL | Status: AC
  Administered 2021-02-13: 650 mg via ORAL

## 2021-02-13 MED ORDER — DIPHENHYDRAMINE HCL 25 MG PO CAPS
50.0000 mg | ORAL_CAPSULE | Freq: Once | ORAL | Status: AC
  Administered 2021-02-13: 50 mg via ORAL

## 2021-02-13 MED ORDER — ACETAMINOPHEN 325 MG PO TABS
ORAL_TABLET | ORAL | Status: AC
  Filled 2021-02-13: qty 2

## 2021-02-13 MED ORDER — SODIUM CHLORIDE 0.9 % IV SOLN
Freq: Once | INTRAVENOUS | Status: AC
  Filled 2021-02-13: qty 250

## 2021-02-13 NOTE — Patient Instructions (Signed)
Per MD, please pick up RX for Tramadol at pharmacy for perineal pain.  May use sitz bath & preparation H at home.  Take over the counter stool softeners if needed for constipation.  Please see your surgeon to discuss if this does not improve.    [place] Discharge Instructions for Patients Receiving Chemotherapy  Today you received the following immunotherapy agent: Trastuzumab-dkst (Ogivri).  To help prevent nausea and vomiting after your treatment, we encourage you to take your nausea medication as directed by your MD.    If you develop nausea and vomiting that is not controlled by your nausea medication, call the clinic.   BELOW ARE SYMPTOMS THAT SHOULD BE REPORTED IMMEDIATELY: *FEVER GREATER THAN 100.5 F *CHILLS WITH OR WITHOUT FEVER NAUSEA AND VOMITING THAT IS NOT CONTROLLED WITH YOUR NAUSEA MEDICATION *UNUSUAL SHORTNESS OF BREATH *UNUSUAL BRUISING OR BLEEDING TENDERNESS IN MOUTH AND THROAT WITH OR WITHOUT PRESENCE OF ULCERS *URINARY PROBLEMS *BOWEL PROBLEMS UNUSUAL RASH Items with * indicate a potential emergency and should be followed up as soon as possible.  Feel free to call the clinic should you have any questions or concerns. The clinic phone number is [phone].  Please show the [place] at check-in to the Emergency Department and triage nurse.

## 2021-02-13 NOTE — Progress Notes (Signed)
[facility] [place], [place] [address] [phone]   Interval Evaluation  Date of Service: 02/13/21 Patient Name: Monica Mccoy Patient MRN: [id number] Patient DOB: [birth year] Provider: Ventura Sellers, MD  Identifying Statement:  Monica Mccoy is a 55 y.o. female with migraine headaches  Primary Cancer:  Oncologic History: Oncology History  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[place])  06/13/2012 Initial Diagnosis   Right breast biopsy: Invasive ductal carcinoma, grade 3, HER-2 positive (3+), ER/PR negative, Ki67 5-10%, and in the right breast, high grade DCIS. BRCA gene testing was negative   08/06/2012 Surgery   Bilateral mastectomies:  Right breast: Invasive ductal carcinoma, 0.7cm, grade 2, with extensive DCIS, clear margins, 8/11 lymph nodes positive, measuring up to 3.8cm,  Left breast, no evidence of malignancy and 6 lymph nodes negative for carcinoma.    2014 - 2015 Chemotherapy   adjuvant chemotherapy with 4 cycles of dose dense Adriamycin and Cytoxan followed by 4 cycles of Taxol Herceptin and then Herceptin maintenance. She underwent right chest radiation.       12/24/2013 Relapse/Recurrence   Dizziness, headaches, ataxia, and memory loss. Head CT showed a 1.8cm and a 2.0cm frontal lobe masses. She underwent resection of both masses on 01/07/14,: Moderately differentiated carcinoma, likely of breast origin, HER-2 positive, ER/PR negative, CK7 positive.   2015 - 2015 Radiation Therapy   Received whole brain radiation    Chemotherapy   Taxotere, Herceptin and Perjeta, but Taxotere was discontinued after 4 cycles       11/21/2020 -  Chemotherapy      Patient is on Antibody Plan: BREAST TRASTUZUMAB Q21D      CNS Oncologic History 2015: L frontal craniotomy followed by WBRT at [facility] in [place]  Interval History:  Darneisha Windhorst presents today for headache follow up.  She describes ongoing  headaches, 2 per week.  But severity and duration has improved since starting the depakote.  Dosing ibuprofen early during breakthrough events.  No other new complaints, she is due for herceptin today as well with Dr. Lindi Adie.  H+P (10/24/20) Patient presents today to review and assess neurologic complaints.  Much of history was provided by sister present at bedside.  She describes chronic short term memory loss, fatigue, and depression symptoms since undergoing whole brain radiation for metastatic breast cancer in 2015.  She also describes difficulty with communication, forming sentences and getting words out properly/fluidly.  Also describes twice weekly migraine headaches associated with nausea/vomiting, photophobia.  She will take a Tylenol or Tramodol late in the headache course, with minimal efficacy. Currently living with family in [place] after living most of life in [place] area.  Needs assistance with many ADLs because of cognitive impairment, physical disability secondary to chronic pain.  Her sleep pattern is very irregular.       Medications: Current Outpatient Medications on File Prior to Visit  Medication Sig Dispense Refill   acetaminophen (TYLENOL) 500 MG tablet Take 1,000 mg by mouth every 6 (six) hours as needed.     divalproex (DEPAKOTE) 500 MG DR tablet Take 1 tablet (500 mg total) by mouth 2 (two) times daily. 60 tablet 3   DULoxetine (CYMBALTA) 60 MG capsule Take 1 capsule (60 mg total) by mouth daily. 90 capsule 1   gabapentin (NEURONTIN) 300 MG capsule Take 300 mg by mouth 3 (three) times daily.     mirabegron ER (MYRBETRIQ) 50 MG TB24 tablet Take 1 tablet (50  mg total) by mouth daily. 90 tablet 1   traMADol (ULTRAM) 50 MG tablet Take 1 tablet (50 mg total) by mouth 2 (two) times daily as needed. 30 tablet 0   No current facility-administered medications on file prior to visit.    Allergies:  Allergies  Allergen Reactions   Dilaudid [Hydromorphone Hcl] Other (See  Comments)    Confusion & hallucinations   Morphine And Related Other (See Comments)    HA   Penicillins    Tramadol    Past Medical History:  Past Medical History:  Diagnosis Date   Anxiety    Breast cancer metastasized to brain [facility])    Left breast   Chronic pain after cancer treatment    Depression    GERD (gastroesophageal reflux disease)    History of cancer chemotherapy    Breast cancer left breast   Seizure (HCC)    Past Surgical History:  Past Surgical History:  Procedure Laterality Date   BRAIN SURGERY     brain tumor   BREAST SURGERY     Social History:  Social History   Socioeconomic History   Marital status: Single    Spouse name: Not on file   Number of children: Not on file   Years of education: Not on file   Highest education level: Not on file  Occupational History   Not on file  Tobacco Use   Smoking status: Never   Smokeless tobacco: Never  Vaping Use   Vaping Use: Never used  Substance and Sexual Activity   Alcohol use: Yes    Comment: On occasion   Drug use: Never   Sexual activity: Not Currently  Other Topics Concern   Not on file  Social History Narrative   Not on file   Social Determinants of Health   Financial Resource Strain: Medium Risk   Difficulty of Paying Living Expenses: Somewhat hard  Food Insecurity: Food Insecurity Present   Worried About Running Out of Food in the Last Year: Sometimes true   Ran Out of Food in the Last Year: Never true  Transportation Needs: No Transportation Needs   Lack of Transportation (Medical): No   Lack of Transportation (Non-Medical): No  Physical Activity: Insufficiently Active   Days of Exercise per Week: 1 day   Minutes of Exercise per Session: 30 min  Stress: No Stress Concern Present   Feeling of Stress : Only a little  Social Connections: Socially Isolated   Frequency of Communication with Friends and Family: More than three times a week   Frequency of Social Gatherings with Friends  and Family: Once a week   Attends Religious Services: Never   Marine scientist or Organizations: No   Attends Music therapist: Never   Marital Status: Never married  Human resources officer Violence: Not At Risk   Fear of Current or Ex-Partner: No   Emotionally Abused: No   Physically Abused: No   Sexually Abused: No   Family History: No family history on file.  Review of Systems: Constitutional: Doesn't report fevers, chills or abnormal weight loss Eyes: Doesn't report blurriness of vision Ears, nose, mouth, throat, and face: Doesn't report sore throat Respiratory: Doesn't report cough, dyspnea or wheezes Cardiovascular: Doesn't report palpitation, chest discomfort  Gastrointestinal:  Doesn't report nausea, constipation, diarrhea GU: Doesn't report incontinence Skin: Doesn't report skin rashes Neurological: Per HPI Musculoskeletal: Doesn't report joint pain Behavioral/Psych: Doesn't report anxiety  Physical Exam: Vitals:   02/13/21 1218  BP:  133/87  Pulse: 72  Resp: 18  Temp: 98 F (36.7 C)   KPS: 60. General: Alert, cooperative, pleasant, in no acute distress Head: Normal EENT: No conjunctival injection or scleral icterus.  Lungs: Resp effort normal Cardiac: Regular rate Abdomen: Non-distended abdomen Skin: No rashes cyanosis or petechiae. Extremities: No clubbing or edema  Neurologic Exam: Mental Status: Awake, alert, attentive to examiner. Oriented to self and environment. Language is noted for impairment in fluency, and frequent paraphasic errors.  Age advanced psychomotor slowing. Cranial Nerves: Visual acuity is grossly normal. Visual fields are full. Extra-ocular movements intact. No ptosis. Face is symmetric Motor: Tone and bulk are normal. Power is full in both arms and legs. Reflexes are symmetric, no pathologic reflexes present.  Sensory: Intact to light touch Gait: Deferred, orthopedic limitation  Labs: I have reviewed the data as  listed    Component Value Date/Time   NA 142 01/16/2021 1200   NA 139 12/15/2019 0000   K 3.8 01/16/2021 1200   CL 103 01/16/2021 1200   CO2 30 01/16/2021 1200   GLUCOSE 89 01/16/2021 1200   BUN 13 01/16/2021 1200   BUN 10 12/15/2019 0000   CREATININE 0.86 01/16/2021 1200   CALCIUM 9.7 01/16/2021 1200   PROT 7.5 01/16/2021 1200   ALBUMIN 3.7 01/16/2021 1200   AST 14 (L) 01/16/2021 1200   ALT 7 01/16/2021 1200   ALKPHOS 77 01/16/2021 1200   BILITOT 0.6 01/16/2021 1200   GFRNONAA >60 01/16/2021 1200   Lab Results  Component Value Date   WBC 3.3 (L) 02/13/2021   NEUTROABS 1.6 (L) 02/13/2021   HGB 12.4 02/13/2021   HCT 38.3 02/13/2021   MCV 93.6 02/13/2021   PLT 143 (L) 02/13/2021     Assessment/Plan Migraine without aura and without status migrainosus, not intractable  Torria Lippard presents again today with syndrome consistent with classical migraine without aura.  Recommended continuing Depakote 500mg  BID given good tolerance, positive results.  Also Ibuprofen or Naprosyn for breakthrough headaches.   We appreciate the opportunity to participate in the care of [facility].    We ask that [name] return to clinic in 6 months following next brain MRI, or sooner as needed.  All questions were answered. The patient knows to call the clinic with any problems, questions or concerns. No barriers to learning were detected.  The total time spent in the encounter was 30 minutes and more than 50% was on counseling and review of test results   Ventura Sellers, MD Medical Director of Neuro-Oncology [facility] [place] 02/13/21 12:20 PM

## 2021-02-16 ENCOUNTER — Encounter: Payer: Self-pay | Admitting: Podiatry

## 2021-02-16 NOTE — Progress Notes (Signed)
  Subjective:  Patient ID: Monica Mccoy, female    DOB: [birth year],  MRN: [id number]  55 y.o. female presents at risk foot care with history of chemotherapy induced neuropathy and painful porokeratotic lesion(s) b/l 5th digits and painful mycotic toenails that limit ambulation. Painful toenails interfere with ambulation. Aggravating factors include wearing enclosed shoe gear. Pain is relieved with periodic professional debridement. Painful porokeratotic lesions are aggravated when weightbearing with and without shoegear. Pain is relieved with periodic professional debridement.  We have been treating her painful lesions conservatively for nearly a year.  She would like to discuss other options in regards to her painful porokeratotic lesions of b/l 5th digits. Her right 5th toe is more symptomatic than her left 5th digit.  PCP is Dr. Letta Median and last visit was 02/03/2021.  Allergies  Allergen Reactions   Dilaudid [Hydromorphone Hcl] Other (See Comments)    Confusion & hallucinations   Morphine And Related Other (See Comments)    HA   Penicillins    Tramadol     Review of Systems: Negative except as noted in the HPI.   Objective:   Constitutional Pt is a pleasant 55 y.o. African American female morbidly obese in NAD. AAO x 3.   Vascular Capillary fill time to digits <3 seconds b/l lower extremities. Palpable DP pulse(s) b/l lower extremities Faintly palpable PT pulse(s) b/l lower extremities. Pedal hair absent. Lower extremity skin temperature gradient within normal limits. No pain with calf compression b/l.  Neurologic Pt has subjective symptoms of neuropathy. Protective sensation decreased with 10 gram monofilament b/l. Vibratory sensation intact b/l.  Dermatologic Toenails 1-5 b/l elongated, discolored, dystrophic, thickened, crumbly with subungual debris and tenderness to dorsal palpation. Porokeratotic lesion(s) L 5th toe and R 5th toe. No erythema, no edema, no drainage, no  fluctuance.  Orthopedic: Normal muscle strength 5/5 to all lower extremity muscle groups bilaterally. No pain crepitus or joint limitation noted with ROM b/l. Hammertoe(s) noted to the L 5th toe and R 5th toe.   Radiographs: None Assessment:   1. Pain due to onychomycosis of toenails of both feet   2. Porokeratosis   3. Hammer toes of both feet   4. Chemotherapy-induced neuropathy ([place])    Plan:  Patient was evaluated and treated and all questions answered.  Onychomycosis with pain -Nails palliatively debridement as below. -Educated on self-care  Procedure: Nail Debridement Rationale: Pain Type of Debridement: manual, sharp debridement. Instrumentation: Nail nipper, rotary burr. Number of Nails: 10  -Examined patient. -Patient to continue soft, supportive shoe gear daily. -Toenails 1-5 b/l were debrided in length and girth with sterile nail nippers and dremel without iatrogenic bleeding.  -Painful porokeratotic lesion(s) L 5th toe and R 5th toe pared and enucleated with sterile scalpel blade without incident. Total number of lesions debrided=2. -Patient to report any pedal injuries to medical professional immediately. -Patient referred for Dr.Titorya Stover for surgical consultation regarding bilateral 5th digit hammertoes with painful porokeratotic lesion formation right more symptomatic than left. -Patient/POA to call should there be question/concern in the interim.  Return in about 3 months (around 05/13/2021).  Marzetta Board, DPM

## 2021-02-23 ENCOUNTER — Other Ambulatory Visit: Payer: Self-pay

## 2021-02-23 ENCOUNTER — Encounter: Payer: Self-pay | Admitting: Sports Medicine

## 2021-02-23 ENCOUNTER — Ambulatory Visit (INDEPENDENT_AMBULATORY_CARE_PROVIDER_SITE_OTHER): Payer: Medicare Other

## 2021-02-23 ENCOUNTER — Ambulatory Visit (INDEPENDENT_AMBULATORY_CARE_PROVIDER_SITE_OTHER): Payer: Medicare Other | Admitting: Sports Medicine

## 2021-02-23 DIAGNOSIS — M2042 Other hammer toe(s) (acquired), left foot: Secondary | ICD-10-CM | POA: Diagnosis not present

## 2021-02-23 DIAGNOSIS — M2041 Other hammer toe(s) (acquired), right foot: Secondary | ICD-10-CM

## 2021-02-23 DIAGNOSIS — M21622 Bunionette of left foot: Secondary | ICD-10-CM

## 2021-02-23 DIAGNOSIS — M79672 Pain in left foot: Secondary | ICD-10-CM

## 2021-02-23 DIAGNOSIS — M79671 Pain in right foot: Secondary | ICD-10-CM

## 2021-02-23 DIAGNOSIS — L84 Corns and callosities: Secondary | ICD-10-CM

## 2021-02-23 NOTE — Progress Notes (Signed)
Subjective: Monica Mccoy is a 55 y.o. female patient who presents to office for evaluation of Right and Left foot pain. Patient complains of progressive pain especially over the years at her toes especially the 5th toes. Has been seeing Dr. Elisha Ponder for corn trimmings but still has pain in toes and rubbing when in shoes. Patient denies any other pedal complaints.   Patient assisted by family member at this visit.   Patient Active Problem List   Diagnosis Date Noted   Port-A-Cath in place 01/16/2021   Migraine without aura 12/19/2020   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[place]) 09/22/2020   Hx of breast cancer 06/29/2020   Hx of seizure disorder 06/29/2020   Gastroesophageal reflux disease without esophagitis 06/29/2020   Other chronic pain 06/29/2020   Moderate episode of recurrent major depressive disorder ([place]) 06/29/2020   Urinary incontinence 06/29/2020    Current Outpatient Medications on File Prior to Visit  Medication Sig Dispense Refill   acetaminophen (TYLENOL) 500 MG tablet Take 1,000 mg by mouth every 6 (six) hours as needed.     divalproex (DEPAKOTE) 500 MG DR tablet Take 1 tablet (500 mg total) by mouth 2 (two) times daily. 60 tablet 3   DULoxetine (CYMBALTA) 60 MG capsule Take 1 capsule (60 mg total) by mouth daily. 90 capsule 1   gabapentin (NEURONTIN) 300 MG capsule Take 300 mg by mouth 3 (three) times daily.     mirabegron ER (MYRBETRIQ) 50 MG TB24 tablet Take 1 tablet (50 mg total) by mouth daily. 90 tablet 1   traMADol (ULTRAM) 50 MG tablet Take 1 tablet (50 mg total) by mouth 2 (two) times daily as needed. 30 tablet 0   No current facility-administered medications on file prior to visit.    Allergies  Allergen Reactions   Dilaudid [Hydromorphone Hcl] Other (See Comments)    Confusion & hallucinations   Morphine And Related Other (See Comments)    HA   Penicillins    Tramadol     Objective:  General: Alert and oriented x3 in no acute  distress  Dermatology: Small hyperkeratotic lesion overlying 5 PIPJ dorsally bilateral. No open lesions bilateral lower extremities, no webspace macerations, no ecchymosis bilateral, all nails x 10 are well manicured.  Vascular: Dorsalis Pedis and Posterior Tibial pedal pulses 1/4, Capillary Fill Time 3 seconds,(+) pedal hair growth bilateral, no edema bilateral lower extremities, Temperature gradient within normal limits.  Neurology: Gross sensation intact via light touch bilateral.   Musculoskeletal: Semi-flexible hammertoes 2-5 with Mild tenderness with palpation at PIPJ 5th toes bilateral. Prominent 5th met head on left/taliors bunion, No pain with calf compression bilateral.  Strength within normal limits in all groups bilateral.   Gait: Unassisted, Non-antalgic.  Xrays  Right/Left Foot    Impression:flexion and varus deformity of toes and enlarged 5th met head on left       Assessment and Plan: Problem List Items Addressed This Visit   None Visit Diagnoses     Hammertoes of both feet    -  Primary   Relevant Orders   DG Foot Complete Right   DG Foot Complete Left   Corn of toe       Tailor's bunion of left foot       Foot pain, bilateral            -Complete examination performed -Xrays reviewed -Discussed treatement options for hammertoes and bunionette on left -Patient opt for surgical management. Consent obtained for Bilateral 5th hammertoe  with excision of corn and left 5th extostectomy at tailors bunion. Pre and Post op course explained. Risks, benefits, alternatives explained. No guarantees given or implied. Surgical booking slip submitted and provided patient with Surgical packet and info for [facility] -To dispense surgical shoes to use post op at [facility] -All questions answered -Patient to return to office after surgery or sooner if condition worsens.  Landis Martins, DPM

## 2021-03-01 ENCOUNTER — Telehealth: Payer: Self-pay

## 2021-03-01 NOTE — Telephone Encounter (Signed)
Received surgery paperwork from Dr. Cannon Kettle. I left a message for her to call so we can schedule surgery.

## 2021-03-07 ENCOUNTER — Other Ambulatory Visit: Payer: Self-pay | Admitting: Family Medicine

## 2021-03-07 DIAGNOSIS — F331 Major depressive disorder, recurrent, moderate: Secondary | ICD-10-CM

## 2021-03-07 NOTE — Telephone Encounter (Signed)
Refill request for:  Duloxetine 60 mg LR 09/09/20, #90, 1 rf LOV 02/03/21, Hospital f/u FOV  none scheduled.    Please review and advise.  Thanks. Dm/cma

## 2021-03-08 ENCOUNTER — Other Ambulatory Visit: Payer: Self-pay | Admitting: Family Medicine

## 2021-03-08 DIAGNOSIS — G40909 Epilepsy, unspecified, not intractable, without status epilepticus: Secondary | ICD-10-CM

## 2021-03-08 MED ORDER — DULOXETINE HCL 60 MG PO CPEP: 60.0000 mg | ORAL_CAPSULE | Freq: Every day | ORAL | 1 refills | Status: DC

## 2021-03-08 NOTE — Addendum Note (Signed)
Addended by: Ronnald Nian on: 03/08/2021 01:05 PM   Modules accepted: Orders

## 2021-03-08 NOTE — Telephone Encounter (Signed)
Discontinued by another provider, pt has not took medication x 30 days

## 2021-03-13 ENCOUNTER — Other Ambulatory Visit: Payer: Self-pay

## 2021-03-13 ENCOUNTER — Inpatient Hospital Stay: Payer: Medicare Other | Attending: Internal Medicine

## 2021-03-13 VITALS — BP 111/96 | HR 81 | Temp 98.4°F | Resp 18 | Ht 65.0 in | Wt 282.5 lb

## 2021-03-13 DIAGNOSIS — C7931 Secondary malignant neoplasm of brain: Secondary | ICD-10-CM | POA: Diagnosis present

## 2021-03-13 DIAGNOSIS — Z923 Personal history of irradiation: Secondary | ICD-10-CM | POA: Insufficient documentation

## 2021-03-13 DIAGNOSIS — C50911 Malignant neoplasm of unspecified site of right female breast: Secondary | ICD-10-CM

## 2021-03-13 DIAGNOSIS — Z9221 Personal history of antineoplastic chemotherapy: Secondary | ICD-10-CM | POA: Insufficient documentation

## 2021-03-13 DIAGNOSIS — Z5112 Encounter for antineoplastic immunotherapy: Secondary | ICD-10-CM | POA: Diagnosis present

## 2021-03-13 DIAGNOSIS — C50411 Malignant neoplasm of upper-outer quadrant of right female breast: Secondary | ICD-10-CM | POA: Insufficient documentation

## 2021-03-13 MED ORDER — HEPARIN SOD (PORK) LOCK FLUSH 100 UNIT/ML IV SOLN
500.0000 [IU] | Freq: Once | INTRAVENOUS | Status: AC | PRN
  Administered 2021-03-13: 500 [IU]
  Filled 2021-03-13: qty 5

## 2021-03-13 MED ORDER — SODIUM CHLORIDE 0.9 % IV SOLN
750.0000 mg | Freq: Once | INTRAVENOUS | Status: AC
  Administered 2021-03-13: 750 mg via INTRAVENOUS
  Filled 2021-03-13: qty 35.72

## 2021-03-13 MED ORDER — SODIUM CHLORIDE 0.9 % IV SOLN
Freq: Once | INTRAVENOUS | Status: AC
  Filled 2021-03-13: qty 250

## 2021-03-13 MED ORDER — DIPHENHYDRAMINE HCL 25 MG PO CAPS
50.0000 mg | ORAL_CAPSULE | Freq: Once | ORAL | Status: AC
Start: 2021-03-13 — End: 2021-03-13
  Administered 2021-03-13: 50 mg via ORAL

## 2021-03-13 MED ORDER — SODIUM CHLORIDE 0.9% FLUSH
10.0000 mL | INTRAVENOUS | Status: DC | PRN
Start: 2021-03-13 — End: 2021-03-13
  Administered 2021-03-13: 10 mL
  Filled 2021-03-13: qty 10

## 2021-03-13 MED ORDER — DIPHENHYDRAMINE HCL 25 MG PO CAPS
ORAL_CAPSULE | ORAL | Status: AC
  Filled 2021-03-13: qty 2

## 2021-03-13 MED ORDER — ACETAMINOPHEN 325 MG PO TABS
650.0000 mg | ORAL_TABLET | Freq: Once | ORAL | Status: AC
  Administered 2021-03-13: 650 mg via ORAL

## 2021-03-13 MED ORDER — ONDANSETRON HCL 8 MG PO TABS: 8.0000 mg | ORAL_TABLET | Freq: Three times a day (TID) | ORAL | 3 refills | Status: AC | PRN

## 2021-03-13 MED ORDER — ACETAMINOPHEN 325 MG PO TABS
ORAL_TABLET | ORAL | Status: AC
  Filled 2021-03-13: qty 2

## 2021-03-13 NOTE — Progress Notes (Signed)
Per Dr. Lindi Adie - okay to treat with echo from March 2022. Patient to be scheduled for updated echo.

## 2021-03-13 NOTE — Patient Instructions (Signed)
[facility]   Discharge Instructions: **Recommend picking up OTC Imodium for diarrhea** Thank you for choosing [facility] to provide your oncology and hematology care.   If you have a lab appointment with the [facility], please go directly to the [facility] and check in at the registration area.   Wear comfortable clothing and clothing appropriate for easy access to any Portacath or PICC line.   We strive to give you quality time with your provider. You may need to reschedule your appointment if you arrive late (15 or more minutes).  Arriving late affects you and other patients whose appointments are after yours.  Also, if you miss three or more appointments without notifying the office, you may be dismissed from the clinic at the provider's discretion.      For prescription refill requests, have your pharmacy contact our office and allow 72 hours for refills to be completed.    Today you received the following chemotherapy and/or immunotherapy agents: Trastuzumab (Herceptin)      To help prevent nausea and vomiting after your treatment, we encourage you to take your nausea medication as directed.  BELOW ARE SYMPTOMS THAT SHOULD BE REPORTED IMMEDIATELY: *FEVER GREATER THAN 100.4 F (38 C) OR HIGHER *CHILLS OR SWEATING *NAUSEA AND VOMITING THAT IS NOT CONTROLLED WITH YOUR NAUSEA MEDICATION *UNUSUAL SHORTNESS OF BREATH *UNUSUAL BRUISING OR BLEEDING *URINARY PROBLEMS (pain or burning when urinating, or frequent urination) *BOWEL PROBLEMS (unusual diarrhea, constipation, pain near the anus) TENDERNESS IN MOUTH AND THROAT WITH OR WITHOUT PRESENCE OF ULCERS (sore throat, sores in mouth, or a toothache) UNUSUAL RASH, SWELLING OR PAIN  UNUSUAL VAGINAL DISCHARGE OR ITCHING   Items with * indicate a potential emergency and should be followed up as soon as possible or go to the Emergency Department if any problems should occur.  Please show the  CHEMOTHERAPY ALERT CARD or IMMUNOTHERAPY ALERT CARD at check-in to the Emergency Department and triage nurse.  Should you have questions after your visit or need to cancel or reschedule your appointment, please contact [facility]  Dept: [phone]  and follow the prompts.  Office hours are 8:00 a.m. to 4:30 p.m. Monday - Friday. Please note that voicemails left after 4:00 p.m. may not be returned until the following business day.  We are closed weekends and major holidays. You have access to a nurse at all times for urgent questions. Please call the main number to the clinic Dept: [phone] and follow the prompts.   For any non-urgent questions, you may also contact your provider using MyChart. We now offer e-Visits for anyone 56 and older to request care online for non-urgent symptoms. For details visit [email or web address].   Also download the MyChart app! Go to the app store, search "MyChart", open the app, select [facility], and log in with your MyChart username and password.  Due to Covid, a mask is required upon entering the hospital/clinic. If you do not have a mask, one will be given to you upon arrival. For doctor visits, patients may have 1 support person aged 64 or older with them. For treatment visits, patients cannot have anyone with them due to current Covid guidelines and our immunocompromised population.

## 2021-04-10 ENCOUNTER — Inpatient Hospital Stay: Payer: Medicare Other

## 2021-04-11 ENCOUNTER — Telehealth: Payer: Self-pay | Admitting: Hematology and Oncology

## 2021-04-11 NOTE — Telephone Encounter (Signed)
R/s appt per 8/15 sch msg. Pt aware.  

## 2021-04-14 ENCOUNTER — Inpatient Hospital Stay: Payer: Medicare Other | Attending: Internal Medicine

## 2021-04-14 ENCOUNTER — Other Ambulatory Visit: Payer: Self-pay

## 2021-04-14 VITALS — BP 137/94 | HR 64 | Temp 98.3°F | Resp 18 | Wt 288.8 lb

## 2021-04-14 DIAGNOSIS — C50911 Malignant neoplasm of unspecified site of right female breast: Secondary | ICD-10-CM

## 2021-04-14 DIAGNOSIS — C50411 Malignant neoplasm of upper-outer quadrant of right female breast: Secondary | ICD-10-CM | POA: Diagnosis present

## 2021-04-14 DIAGNOSIS — Z0189 Encounter for other specified special examinations: Secondary | ICD-10-CM

## 2021-04-14 DIAGNOSIS — Z9221 Personal history of antineoplastic chemotherapy: Secondary | ICD-10-CM | POA: Insufficient documentation

## 2021-04-14 DIAGNOSIS — C7931 Secondary malignant neoplasm of brain: Secondary | ICD-10-CM | POA: Insufficient documentation

## 2021-04-14 DIAGNOSIS — Z9013 Acquired absence of bilateral breasts and nipples: Secondary | ICD-10-CM | POA: Insufficient documentation

## 2021-04-14 DIAGNOSIS — Z5112 Encounter for antineoplastic immunotherapy: Secondary | ICD-10-CM | POA: Insufficient documentation

## 2021-04-14 DIAGNOSIS — Z923 Personal history of irradiation: Secondary | ICD-10-CM | POA: Insufficient documentation

## 2021-04-14 LAB — CMP (CANCER CENTER ONLY)
ALT: 7 U/L (ref 0–44)
AST: 13 U/L — ABNORMAL LOW (ref 15–41)
Albumin: 3.4 g/dL — ABNORMAL LOW (ref 3.5–5.0)
Alkaline Phosphatase: 68 U/L (ref 38–126)
Anion gap: 11 (ref 5–15)
BUN: 13 mg/dL (ref 6–20)
CO2: 27 mmol/L (ref 22–32)
Calcium: 8.8 mg/dL — ABNORMAL LOW (ref 8.9–10.3)
Chloride: 101 mmol/L (ref 98–111)
Creatinine: 1.02 mg/dL — ABNORMAL HIGH (ref 0.44–1.00)
GFR, Estimated: >60 mL/min (ref >60)
Glucose, Bld: 80 mg/dL (ref 70–99)
Potassium: 4 mmol/L (ref 3.5–5.1)
Sodium: 139 mmol/L (ref 135–145)
Total Bilirubin: 0.5 mg/dL (ref 0.3–1.2)
Total Protein: 7.1 g/dL (ref 6.5–8.1)

## 2021-04-14 LAB — CBC WITH DIFFERENTIAL (CANCER CENTER ONLY)
Abs Immature Granulocytes: 0.02 10*3/uL (ref 0.00–0.07)
Basophils Absolute: 0 10*3/uL (ref 0.0–0.1)
Basophils Relative: 1 %
Eosinophils Absolute: 0.1 10*3/uL (ref 0.0–0.5)
Eosinophils Relative: 3 %
HCT: 33.5 % — ABNORMAL LOW (ref 36.0–46.0)
Hemoglobin: 11 g/dL — ABNORMAL LOW (ref 12.0–15.0)
Immature Granulocytes: 1 %
Lymphocytes Relative: 31 %
Lymphs Abs: 1.3 10*3/uL (ref 0.7–4.0)
MCH: 30.6 pg (ref 26.0–34.0)
MCHC: 32.8 g/dL (ref 30.0–36.0)
MCV: 93.1 fL (ref 80.0–100.0)
Monocytes Absolute: 0.7 10*3/uL (ref 0.1–1.0)
Monocytes Relative: 17 %
Neutro Abs: 1.9 10*3/uL (ref 1.7–7.7)
Neutrophils Relative %: 47 %
Platelet Count: 142 10*3/uL — ABNORMAL LOW (ref 150–400)
RBC: 3.6 MIL/uL — ABNORMAL LOW (ref 3.87–5.11)
RDW: 13.7 % (ref 11.5–15.5)
WBC Count: 4.1 10*3/uL (ref 4.0–10.5)
nRBC: 0 % (ref 0.0–0.2)

## 2021-04-14 MED ORDER — SODIUM CHLORIDE 0.9% FLUSH
10.0000 mL | INTRAVENOUS | Status: DC | PRN
  Administered 2021-04-14: 10 mL

## 2021-04-14 MED ORDER — ACETAMINOPHEN 325 MG PO TABS
650.0000 mg | ORAL_TABLET | Freq: Once | ORAL | Status: AC
  Administered 2021-04-14: 650 mg via ORAL
  Filled 2021-04-14: qty 2

## 2021-04-14 MED ORDER — SODIUM CHLORIDE 0.9 % IV SOLN: Freq: Once | INTRAVENOUS | Status: AC

## 2021-04-14 MED ORDER — HEPARIN SOD (PORK) LOCK FLUSH 100 UNIT/ML IV SOLN
500.0000 [IU] | Freq: Once | INTRAVENOUS | Status: AC | PRN
  Administered 2021-04-14: 500 [IU]

## 2021-04-14 MED ORDER — DIPHENHYDRAMINE HCL 25 MG PO CAPS
50.0000 mg | ORAL_CAPSULE | Freq: Once | ORAL | Status: AC
Start: 2021-04-14 — End: 2021-04-14
  Administered 2021-04-14: 50 mg via ORAL
  Filled 2021-04-14: qty 2

## 2021-04-14 MED ORDER — TRASTUZUMAB-DKST CHEMO 150 MG IV SOLR
750.0000 mg | Freq: Once | INTRAVENOUS | Status: AC
  Administered 2021-04-14: 750 mg via INTRAVENOUS
  Filled 2021-04-14: qty 35.72

## 2021-04-14 NOTE — Progress Notes (Signed)
Ok to Use current echo from 11/21/20 per Dr.Gudena

## 2021-04-14 NOTE — Progress Notes (Unsigned)
Per MD, OK to treat today with ECHO from March 2022. Orders entered for ECHO. Will schedule.

## 2021-04-14 NOTE — Patient Instructions (Signed)
[facility]  Discharge Instructions: Thank you for choosing [facility] to provide your oncology and hematology care.   If you have a lab appointment with the Cancer Center, please go directly to the Cancer Center and check in at the registration area.   Wear comfortable clothing and clothing appropriate for easy access to any Portacath or PICC line.   We strive to give you quality time with your provider. You may need to reschedule your appointment if you arrive late (15 or more minutes).  Arriving late affects you and other patients whose appointments are after yours.  Also, if you miss three or more appointments without notifying the office, you may be dismissed from the clinic at the provider's discretion.      For prescription refill requests, have your pharmacy contact our office and allow 72 hours for refills to be completed.    Today you received the following chemotherapy and/or immunotherapy agents herceptin      To help prevent nausea and vomiting after your treatment, we encourage you to take your nausea medication as directed.  BELOW ARE SYMPTOMS THAT SHOULD BE REPORTED IMMEDIATELY: *FEVER GREATER THAN 100.4 F (38 C) OR HIGHER *CHILLS OR SWEATING *NAUSEA AND VOMITING THAT IS NOT CONTROLLED WITH YOUR NAUSEA MEDICATION *UNUSUAL SHORTNESS OF BREATH *UNUSUAL BRUISING OR BLEEDING *URINARY PROBLEMS (pain or burning when urinating, or frequent urination) *BOWEL PROBLEMS (unusual diarrhea, constipation, pain near the anus) TENDERNESS IN MOUTH AND THROAT WITH OR WITHOUT PRESENCE OF ULCERS (sore throat, sores in mouth, or a toothache) UNUSUAL RASH, SWELLING OR PAIN  UNUSUAL VAGINAL DISCHARGE OR ITCHING   Items with * indicate a potential emergency and should be followed up as soon as possible or go to the Emergency Department if any problems should occur.  Please show the CHEMOTHERAPY ALERT CARD or IMMUNOTHERAPY ALERT CARD at check-in to  the Emergency Department and triage nurse.  Should you have questions after your visit or need to cancel or reschedule your appointment, please contact [facility]  Dept: [phone]  and follow the prompts.  Office hours are 8:00 a.m. to 4:30 p.m. Monday - Friday. Please note that voicemails left after 4:00 p.m. may not be returned until the following business day.  We are closed weekends and major holidays. You have access to a nurse at all times for urgent questions. Please call the main number to the clinic Dept: [phone] and follow the prompts.   For any non-urgent questions, you may also contact your provider using MyChart. We now offer e-Visits for anyone 18 and older to request care online for non-urgent symptoms. For details visit [email or web address].   Also download the MyChart app! Go to the app store, search "MyChart", open the app, select [facility], and log in with your MyChart username and password.  Due to Covid, a mask is required upon entering the hospital/clinic. If you do not have a mask, one will be given to you upon arrival. For doctor visits, patients may have 1 support person aged 18 or older with them. For treatment visits, patients cannot have anyone with them due to current Covid guidelines and our immunocompromised population.   

## 2021-05-04 ENCOUNTER — Ambulatory Visit (HOSPITAL_COMMUNITY)
Admission: RE | Admit: 2021-05-04 | Discharge: 2021-05-04 | Disposition: A | Discharge status: Home / Self Care | Payer: Medicare Other | Source: Ambulatory Visit | Attending: Hematology and Oncology | Admitting: Hematology and Oncology

## 2021-05-04 ENCOUNTER — Other Ambulatory Visit: Payer: Self-pay

## 2021-05-04 DIAGNOSIS — Z0189 Encounter for other specified special examinations: Secondary | ICD-10-CM | POA: Diagnosis not present

## 2021-05-04 DIAGNOSIS — Z01810 Encounter for preprocedural cardiovascular examination: Secondary | ICD-10-CM | POA: Diagnosis present

## 2021-05-04 DIAGNOSIS — C7931 Secondary malignant neoplasm of brain: Secondary | ICD-10-CM | POA: Insufficient documentation

## 2021-05-04 DIAGNOSIS — C50911 Malignant neoplasm of unspecified site of right female breast: Secondary | ICD-10-CM | POA: Diagnosis present

## 2021-05-04 LAB — ECHOCARDIOGRAM COMPLETE
Area-P 1/2: 1.22 cm2
Calc EF: 51.4 %
S' Lateral: 3.7 cm
Single Plane A2C EF: 51 %
Single Plane A4C EF: 50.2 %

## 2021-05-04 NOTE — Progress Notes (Signed)
  Echocardiogram 2D Echocardiogram has been performed.  Fidel Levy 05/04/2021, 10:08 AM

## 2021-05-06 NOTE — Progress Notes (Signed)
Patient Care Team: Haydee Salter, MD as PCP - General (Family Medicine) Pcp, No as PCP - Family Medicine  DIAGNOSIS:    ICD-10-CM   1. Cancer of right breast metastatic to brain [name])  C50.911    C79.31       SUMMARY OF ONCOLOGIC HISTORY: Oncology History  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[name])  06/13/2012 Initial Diagnosis   Right breast biopsy: Invasive ductal carcinoma, grade 3, HER-2 positive (3+), ER/PR negative, Ki67 5-10%, and in the right breast, high grade DCIS. BRCA gene testing was negative   08/06/2012 Surgery   Bilateral mastectomies:  Right breast: Invasive ductal carcinoma, 0.7cm, grade 2, with extensive DCIS, clear margins, 8/11 lymph nodes positive, measuring up to 3.8cm,  Left breast, no evidence of malignancy and 6 lymph nodes negative for carcinoma.    2014 - 2015 Chemotherapy   adjuvant chemotherapy with 4 cycles of dose dense Adriamycin and Cytoxan followed by 4 cycles of Taxol Herceptin and then Herceptin maintenance. She underwent right chest radiation.       12/24/2013 Relapse/Recurrence   Dizziness, headaches, ataxia, and memory loss. Head CT showed a 1.8cm and a 2.0cm frontal lobe masses. She underwent resection of both masses on 01/07/14,: Moderately differentiated carcinoma, likely of breast origin, HER-2 positive, ER/PR negative, CK7 positive.   2015 - 2015 Radiation Therapy   Received whole brain radiation    Chemotherapy   Taxotere, Herceptin and Perjeta, but Taxotere was discontinued after 4 cycles       11/21/2020 -  Chemotherapy      Patient is on Antibody Plan: BREAST TRASTUZUMAB Q21D       CHIEF COMPLIANT: Cycle 7 Herceptin  INTERVAL HISTORY: Monica Mccoy is a 55 y.o. with above-mentioned history of metastatic breast cancer currently on Herceptin. She reports to the clinic today for cycle 7.   ALLERGIES:  is allergic to dilaudid [hydromorphone hcl], morphine and related, penicillins, and tramadol.  MEDICATIONS:   Current Outpatient Medications  Medication Sig Dispense Refill   acetaminophen (TYLENOL) 500 MG tablet Take 1,000 mg by mouth every 6 (six) hours as needed.     divalproex (DEPAKOTE) 500 MG DR tablet Take 1 tablet (500 mg total) by mouth 2 (two) times daily. 60 tablet 3   DULoxetine (CYMBALTA) 60 MG capsule Take 1 capsule (60 mg total) by mouth daily. 90 capsule 1   gabapentin (NEURONTIN) 300 MG capsule Take 300 mg by mouth 3 (three) times daily.     mirabegron ER (MYRBETRIQ) 50 MG TB24 tablet Take 1 tablet (50 mg total) by mouth daily. 90 tablet 1   traMADol (ULTRAM) 50 MG tablet Take 1 tablet (50 mg total) by mouth 2 (two) times daily as needed. 30 tablet 0   No current facility-administered medications for this visit.    PHYSICAL EXAMINATION: ECOG PERFORMANCE STATUS: 1 - Symptomatic but completely ambulatory  Vitals:   05/08/21 0950  BP: (!) 144/99  Pulse: 62  Resp: 18  Temp: (!) 97.2 F (36.2 C)  SpO2: 96%   Filed Weights   05/08/21 0950  Weight: 289 lb 9 oz (131.3 kg)    LABORATORY DATA:  I have reviewed the data as listed CMP Latest Ref Rng & Units 04/14/2021 02/13/2021 01/16/2021  Glucose 70 - 99 mg/dL 80 96 89  BUN 6 - 20 mg/dL $Remove'13 10 13  'ORoGVpG$ Creatinine 0.44 - 1.00 mg/dL 1.02(H) 0.98 0.86  Sodium 135 - 145 mmol/L 139 141 142  Potassium 3.5 - 5.1  mmol/L 4.0 3.7 3.8  Chloride 98 - 111 mmol/L 101 102 103  CO2 22 - 32 mmol/L $RemoveB'27 29 30  'pcBEwdaq$ Calcium 8.9 - 10.3 mg/dL 8.8(L) 8.7(L) 9.7  Total Protein 6.5 - 8.1 g/dL 7.1 6.9 7.5  Total Bilirubin 0.3 - 1.2 mg/dL 0.5 0.6 0.6  Alkaline Phos 38 - 126 U/L 68 53 77  AST 15 - 41 U/L 13(L) 16 14(L)  ALT 0 - 44 U/L $Remo'7 8 7    'zVlVL$ Lab Results  Component Value Date   WBC 4.1 04/14/2021   HGB 11.0 (L) 04/14/2021   HCT 33.5 (L) 04/14/2021   MCV 93.1 04/14/2021   PLT 142 (L) 04/14/2021   NEUTROABS 1.9 04/14/2021    ASSESSMENT & PLAN:  Cancer of right breast metastatic to brain [facility]) Moved to [facility] from [place], [facility] in 02/2020 to live with her parents and son near her sister. (2013 HER2 positive breast cancer status post bilateral mastectomies and adjuvant chemotherapy with Herceptin, April 2015 brain metastases status post resection and radiation)   Prior treatment: Patient was on Herceptin and Perjeta maintenance until June 2021 Because of insurance issues she has not had treatment for 7 months until she moved to [place].   1. Severe recurrent headaches: Could be related to prior brain radiation.  10/21/2020 brain MRI with and without contrast: No evidence of intracranial metastatic disease.  Postoperative changes left frontal encephalomalacia/gliosis 2. Severe bilateral chest discomfort without any palpable lesions: 10/20/2020: CT CAP: Post Op changes in Rt chest wall. Multilob uterus.    Current treatment: Herceptin maintenance every 4 weeks started 11/21/2020, after the next scans in February 2023 we can decide on increasing the frequency of infusions. Herceptin toxicities: None  Joint pains/chest wall pains: We will increase her tramadol dosage today.   After a year of monthly Herceptin as we can switch to every 6 weeks thereafter.    No orders of the defined types were placed in this encounter.  The patient has a good understanding of the overall plan. she agrees with it. she will call with any problems that may develop before the next visit here.  Total time spent: 30 mins including face to face time and time spent for planning, charting and coordination of care  Rulon Eisenmenger, MD, MPH 05/08/2021  I, Thana Ates, am acting as scribe for Dr. Nicholas Lose.  I have reviewed the above documentation for accuracy and completeness, and I agree with the above.

## 2021-05-08 ENCOUNTER — Inpatient Hospital Stay: Payer: Medicare Other | Attending: Internal Medicine

## 2021-05-08 ENCOUNTER — Other Ambulatory Visit: Payer: Self-pay

## 2021-05-08 ENCOUNTER — Inpatient Hospital Stay (HOSPITAL_BASED_OUTPATIENT_CLINIC_OR_DEPARTMENT_OTHER): Payer: Medicare Other | Admitting: Hematology and Oncology

## 2021-05-08 DIAGNOSIS — Z5112 Encounter for antineoplastic immunotherapy: Secondary | ICD-10-CM | POA: Diagnosis not present

## 2021-05-08 DIAGNOSIS — C50911 Malignant neoplasm of unspecified site of right female breast: Secondary | ICD-10-CM

## 2021-05-08 DIAGNOSIS — Z79899 Other long term (current) drug therapy: Secondary | ICD-10-CM | POA: Insufficient documentation

## 2021-05-08 DIAGNOSIS — C7931 Secondary malignant neoplasm of brain: Secondary | ICD-10-CM | POA: Insufficient documentation

## 2021-05-08 DIAGNOSIS — Z171 Estrogen receptor negative status [ER-]: Secondary | ICD-10-CM | POA: Diagnosis not present

## 2021-05-08 DIAGNOSIS — Z9013 Acquired absence of bilateral breasts and nipples: Secondary | ICD-10-CM | POA: Insufficient documentation

## 2021-05-08 DIAGNOSIS — C50411 Malignant neoplasm of upper-outer quadrant of right female breast: Secondary | ICD-10-CM | POA: Insufficient documentation

## 2021-05-08 MED ORDER — DIPHENHYDRAMINE HCL 25 MG PO CAPS
ORAL_CAPSULE | ORAL | Status: AC
  Administered 2021-05-08: 50 mg via ORAL
  Filled 2021-05-08: qty 2

## 2021-05-08 MED ORDER — SODIUM CHLORIDE 0.9 % IV SOLN: Freq: Once | INTRAVENOUS | Status: AC

## 2021-05-08 MED ORDER — TRASTUZUMAB-DKST CHEMO 150 MG IV SOLR
750.0000 mg | Freq: Once | INTRAVENOUS | Status: AC
  Administered 2021-05-08: 750 mg via INTRAVENOUS
  Filled 2021-05-08: qty 35.72

## 2021-05-08 MED ORDER — ACETAMINOPHEN 325 MG PO TABS
ORAL_TABLET | ORAL | Status: AC
  Administered 2021-05-08: 650 mg via ORAL
  Filled 2021-05-08: qty 2

## 2021-05-08 MED ORDER — HEPARIN SOD (PORK) LOCK FLUSH 100 UNIT/ML IV SOLN
500.0000 [IU] | Freq: Once | INTRAVENOUS | Status: AC | PRN
  Administered 2021-05-08: 500 [IU]

## 2021-05-08 MED ORDER — DIPHENHYDRAMINE HCL 25 MG PO CAPS: 50.0000 mg | ORAL_CAPSULE | Freq: Once | ORAL | Status: AC

## 2021-05-08 MED ORDER — SODIUM CHLORIDE 0.9% FLUSH
10.0000 mL | INTRAVENOUS | Status: DC | PRN
  Administered 2021-05-08: 10 mL

## 2021-05-08 MED ORDER — ACETAMINOPHEN 325 MG PO TABS: 650.0000 mg | ORAL_TABLET | Freq: Once | ORAL | Status: AC

## 2021-05-08 MED ORDER — TRAMADOL HCL 50 MG PO TABS: 100.0000 mg | ORAL_TABLET | Freq: Two times a day (BID) | ORAL | 0 refills | Status: DC | PRN

## 2021-05-08 NOTE — Patient Instructions (Signed)
[facility]  Discharge Instructions: Thank you for choosing [facility] to provide your oncology and hematology care.   If you have a lab appointment with the [facility], please go directly to the [facility] and check in at the registration area.   Wear comfortable clothing and clothing appropriate for easy access to any Portacath or PICC line.   We strive to give you quality time with your provider. You may need to reschedule your appointment if you arrive late (15 or more minutes).  Arriving late affects you and other patients whose appointments are after yours.  Also, if you miss three or more appointments without notifying the office, you may be dismissed from the clinic at the provider's discretion.      For prescription refill requests, have your pharmacy contact our office and allow 72 hours for refills to be completed.    Today you received the following chemotherapy and/or immunotherapy agents trastuzumab-dskt (OGIVRI)      To help prevent nausea and vomiting after your treatment, we encourage you to take your nausea medication as directed.  BELOW ARE SYMPTOMS THAT SHOULD BE REPORTED IMMEDIATELY: *FEVER GREATER THAN 100.4 F (38 C) OR HIGHER *CHILLS OR SWEATING *NAUSEA AND VOMITING THAT IS NOT CONTROLLED WITH YOUR NAUSEA MEDICATION *UNUSUAL SHORTNESS OF BREATH *UNUSUAL BRUISING OR BLEEDING *URINARY PROBLEMS (pain or burning when urinating, or frequent urination) *BOWEL PROBLEMS (unusual diarrhea, constipation, pain near the anus) TENDERNESS IN MOUTH AND THROAT WITH OR WITHOUT PRESENCE OF ULCERS (sore throat, sores in mouth, or a toothache) UNUSUAL RASH, SWELLING OR PAIN  UNUSUAL VAGINAL DISCHARGE OR ITCHING   Items with * indicate a potential emergency and should be followed up as soon as possible or go to the Emergency Department if any problems should occur.  Please show the CHEMOTHERAPY ALERT CARD or IMMUNOTHERAPY ALERT CARD  at check-in to the Emergency Department and triage nurse.  Should you have questions after your visit or need to cancel or reschedule your appointment, please contact [facility]  Dept: [phone]  and follow the prompts.  Office hours are 8:00 a.m. to 4:30 p.m. Monday - Friday. Please note that voicemails left after 4:00 p.m. may not be returned until the following business day.  We are closed weekends and major holidays. You have access to a nurse at all times for urgent questions. Please call the main number to the clinic Dept: [phone] and follow the prompts.   For any non-urgent questions, you may also contact your provider using MyChart. We now offer e-Visits for anyone 64 and older to request care online for non-urgent symptoms. For details visit [email or web address].   Also download the MyChart app! Go to the app store, search "MyChart", open the app, select [facility], and log in with your MyChart username and password.  Due to Covid, a mask is required upon entering the hospital/clinic. If you do not have a mask, one will be given to you upon arrival. For doctor visits, patients may have 1 support person aged 70 or older with them. For treatment visits, patients cannot have anyone with them due to current Covid guidelines and our immunocompromised population.

## 2021-05-08 NOTE — Assessment & Plan Note (Signed)
Moved to [facility] from [employer]), in 02/2020 to live with her parents and son near her sister. 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  1.Severe recurrent headaches: Could be related to prior brain radiation.2/25/2022brain MRI with and without contrast: No evidence of intracranial metastatic disease. Postoperative changes left frontal encephalomalacia/gliosis 2. Severe bilateral chest discomfort without any palpable lesions:10/20/2020: CT CAP: Post Op changes in Rt chest wall. Multilob uterus.  Whole-body scans will be done once a year.  Current treatment:Herceptin maintenance every 4 weeks started 11/21/2020, after the next scans in February 2023 we can decide on increasing the frequency of infusions. Herceptin toxicities: None  Cholecystitis: We will refer her to surgery. After a year of monthly Herceptin as we can switch to every 6 weeks thereafter.

## 2021-05-19 ENCOUNTER — Other Ambulatory Visit: Payer: Self-pay

## 2021-05-19 ENCOUNTER — Emergency Department (HOSPITAL_COMMUNITY): Payer: Medicare Other

## 2021-05-19 ENCOUNTER — Encounter (HOSPITAL_COMMUNITY): Payer: Self-pay | Admitting: Emergency Medicine

## 2021-05-19 ENCOUNTER — Inpatient Hospital Stay (HOSPITAL_COMMUNITY)
Admission: EM | Admit: 2021-05-19 | Discharge: 2021-05-21 | DRG: 071 | Disposition: A | Discharge status: Home Health | Payer: Medicare Other | Attending: Internal Medicine | Admitting: Internal Medicine

## 2021-05-19 ENCOUNTER — Encounter: Payer: Self-pay | Admitting: Podiatry

## 2021-05-19 ENCOUNTER — Ambulatory Visit (INDEPENDENT_AMBULATORY_CARE_PROVIDER_SITE_OTHER): Payer: Medicare Other | Admitting: Podiatry

## 2021-05-19 DIAGNOSIS — M79674 Pain in right toe(s): Secondary | ICD-10-CM | POA: Diagnosis not present

## 2021-05-19 DIAGNOSIS — C50919 Malignant neoplasm of unspecified site of unspecified female breast: Secondary | ICD-10-CM | POA: Diagnosis present

## 2021-05-19 DIAGNOSIS — Z6841 Body Mass Index (BMI) 40.0 and over, adult: Secondary | ICD-10-CM

## 2021-05-19 DIAGNOSIS — K819 Cholecystitis, unspecified: Secondary | ICD-10-CM | POA: Diagnosis present

## 2021-05-19 DIAGNOSIS — R4701 Aphasia: Secondary | ICD-10-CM | POA: Diagnosis present

## 2021-05-19 DIAGNOSIS — F419 Anxiety disorder, unspecified: Secondary | ICD-10-CM | POA: Diagnosis present

## 2021-05-19 DIAGNOSIS — G9340 Encephalopathy, unspecified: Secondary | ICD-10-CM | POA: Diagnosis not present

## 2021-05-19 DIAGNOSIS — Z20822 Contact with and (suspected) exposure to covid-19: Secondary | ICD-10-CM | POA: Diagnosis present

## 2021-05-19 DIAGNOSIS — F331 Major depressive disorder, recurrent, moderate: Secondary | ICD-10-CM

## 2021-05-19 DIAGNOSIS — B351 Tinea unguium: Secondary | ICD-10-CM

## 2021-05-19 DIAGNOSIS — Z853 Personal history of malignant neoplasm of breast: Secondary | ICD-10-CM

## 2021-05-19 DIAGNOSIS — R112 Nausea with vomiting, unspecified: Principal | ICD-10-CM | POA: Diagnosis present

## 2021-05-19 DIAGNOSIS — Z79899 Other long term (current) drug therapy: Secondary | ICD-10-CM

## 2021-05-19 DIAGNOSIS — R569 Unspecified convulsions: Secondary | ICD-10-CM | POA: Diagnosis present

## 2021-05-19 DIAGNOSIS — C7931 Secondary malignant neoplasm of brain: Secondary | ICD-10-CM | POA: Diagnosis present

## 2021-05-19 DIAGNOSIS — T451X5A Adverse effect of antineoplastic and immunosuppressive drugs, initial encounter: Secondary | ICD-10-CM

## 2021-05-19 DIAGNOSIS — G620 Drug-induced polyneuropathy: Secondary | ICD-10-CM

## 2021-05-19 DIAGNOSIS — G43909 Migraine, unspecified, not intractable, without status migrainosus: Secondary | ICD-10-CM | POA: Diagnosis present

## 2021-05-19 DIAGNOSIS — K219 Gastro-esophageal reflux disease without esophagitis: Secondary | ICD-10-CM | POA: Diagnosis present

## 2021-05-19 DIAGNOSIS — M79675 Pain in left toe(s): Secondary | ICD-10-CM | POA: Diagnosis not present

## 2021-05-19 DIAGNOSIS — Z923 Personal history of irradiation: Secondary | ICD-10-CM

## 2021-05-19 DIAGNOSIS — L84 Corns and callosities: Secondary | ICD-10-CM

## 2021-05-19 DIAGNOSIS — F32A Depression, unspecified: Secondary | ICD-10-CM | POA: Diagnosis present

## 2021-05-19 DIAGNOSIS — R001 Bradycardia, unspecified: Secondary | ICD-10-CM | POA: Diagnosis present

## 2021-05-19 DIAGNOSIS — C50911 Malignant neoplasm of unspecified site of right female breast: Secondary | ICD-10-CM | POA: Diagnosis present

## 2021-05-19 DIAGNOSIS — R32 Unspecified urinary incontinence: Secondary | ICD-10-CM

## 2021-05-19 DIAGNOSIS — Z9221 Personal history of antineoplastic chemotherapy: Secondary | ICD-10-CM

## 2021-05-19 LAB — AMMONIA: Ammonia: 19 umol/L (ref 9–35)

## 2021-05-19 LAB — CBC WITH DIFFERENTIAL/PLATELET
Abs Immature Granulocytes: 0.01 10*3/uL (ref 0.00–0.07)
Basophils Absolute: 0 10*3/uL (ref 0.0–0.1)
Basophils Relative: 1 %
Eosinophils Absolute: 0 10*3/uL (ref 0.0–0.5)
Eosinophils Relative: 1 %
HCT: 40.1 % (ref 36.0–46.0)
Hemoglobin: 13 g/dL (ref 12.0–15.0)
Immature Granulocytes: 0 %
Lymphocytes Relative: 19 %
Lymphs Abs: 0.8 10*3/uL (ref 0.7–4.0)
MCH: 30.9 pg (ref 26.0–34.0)
MCHC: 32.4 g/dL (ref 30.0–36.0)
MCV: 95.2 fL (ref 80.0–100.0)
Monocytes Absolute: 0.7 10*3/uL (ref 0.1–1.0)
Monocytes Relative: 16 %
Neutro Abs: 2.6 10*3/uL (ref 1.7–7.7)
Neutrophils Relative %: 63 %
Platelets: 171 10*3/uL (ref 150–400)
RBC: 4.21 MIL/uL (ref 3.87–5.11)
RDW: 13.1 % (ref 11.5–15.5)
WBC: 4.2 10*3/uL (ref 4.0–10.5)
nRBC: 0 % (ref 0.0–0.2)

## 2021-05-19 LAB — I-STAT VENOUS BLOOD GAS, ED
Acid-Base Excess: 7 mmol/L — ABNORMAL HIGH (ref 0.0–2.0)
Bicarbonate: 28.4 mmol/L — ABNORMAL HIGH (ref 20.0–28.0)
Calcium, Ion: 0.99 mmol/L — ABNORMAL LOW (ref 1.15–1.40)
HCT: 39 % (ref 36.0–46.0)
Hemoglobin: 13.3 g/dL (ref 12.0–15.0)
O2 Saturation: 86 %
Potassium: 3.7 mmol/L (ref 3.5–5.1)
Sodium: 139 mmol/L (ref 135–145)
TCO2: 29 mmol/L (ref 22–32)
pCO2, Ven: 31.1 mmHg — ABNORMAL LOW (ref 44.0–60.0)
pH, Ven: 7.568 — ABNORMAL HIGH (ref 7.250–7.430)
pO2, Ven: 44 mmHg (ref 32.0–45.0)

## 2021-05-19 LAB — URINALYSIS, COMPLETE (UACMP) WITH MICROSCOPIC
Bacteria, UA: NONE SEEN
Bilirubin Urine: NEGATIVE
Glucose, UA: NEGATIVE mg/dL
Hgb urine dipstick: NEGATIVE
Ketones, ur: NEGATIVE mg/dL
Leukocytes,Ua: NEGATIVE
Nitrite: NEGATIVE
Protein, ur: NEGATIVE mg/dL
Specific Gravity, Urine: 1.014 (ref 1.005–1.030)
pH: 9 — ABNORMAL HIGH (ref 5.0–8.0)

## 2021-05-19 LAB — COMPREHENSIVE METABOLIC PANEL
ALT: 11 U/L (ref 0–44)
AST: 16 U/L (ref 15–41)
Albumin: 3.5 g/dL (ref 3.5–5.0)
Alkaline Phosphatase: 60 U/L (ref 38–126)
Anion gap: 10 (ref 5–15)
BUN: 8 mg/dL (ref 6–20)
CO2: 27 mmol/L (ref 22–32)
Calcium: 9.2 mg/dL (ref 8.9–10.3)
Chloride: 101 mmol/L (ref 98–111)
Creatinine, Ser: 0.89 mg/dL (ref 0.44–1.00)
GFR, Estimated: >60 mL/min (ref >60)
Glucose, Bld: 102 mg/dL — ABNORMAL HIGH (ref 70–99)
Potassium: 3.9 mmol/L (ref 3.5–5.1)
Sodium: 138 mmol/L (ref 135–145)
Total Bilirubin: 0.7 mg/dL (ref 0.3–1.2)
Total Protein: 7.3 g/dL (ref 6.5–8.1)

## 2021-05-19 LAB — CBG MONITORING, ED: Glucose-Capillary: 87 mg/dL (ref 70–99)

## 2021-05-19 LAB — ETHANOL: Alcohol, Ethyl (B): <10 mg/dL (ref <10)

## 2021-05-19 LAB — I-STAT BETA HCG BLOOD, ED (MC, WL, AP ONLY): I-stat hCG, quantitative: <5 m[IU]/mL (ref <5)

## 2021-05-19 IMAGING — DX DG CHEST 1V PORT
1 series · 1 of 1 positions shown · non-contrast
Comparison: [DATE]

CLINICAL DATA: Altered mental status

EXAM:
PORTABLE CHEST 1 VIEW

[chest ap]
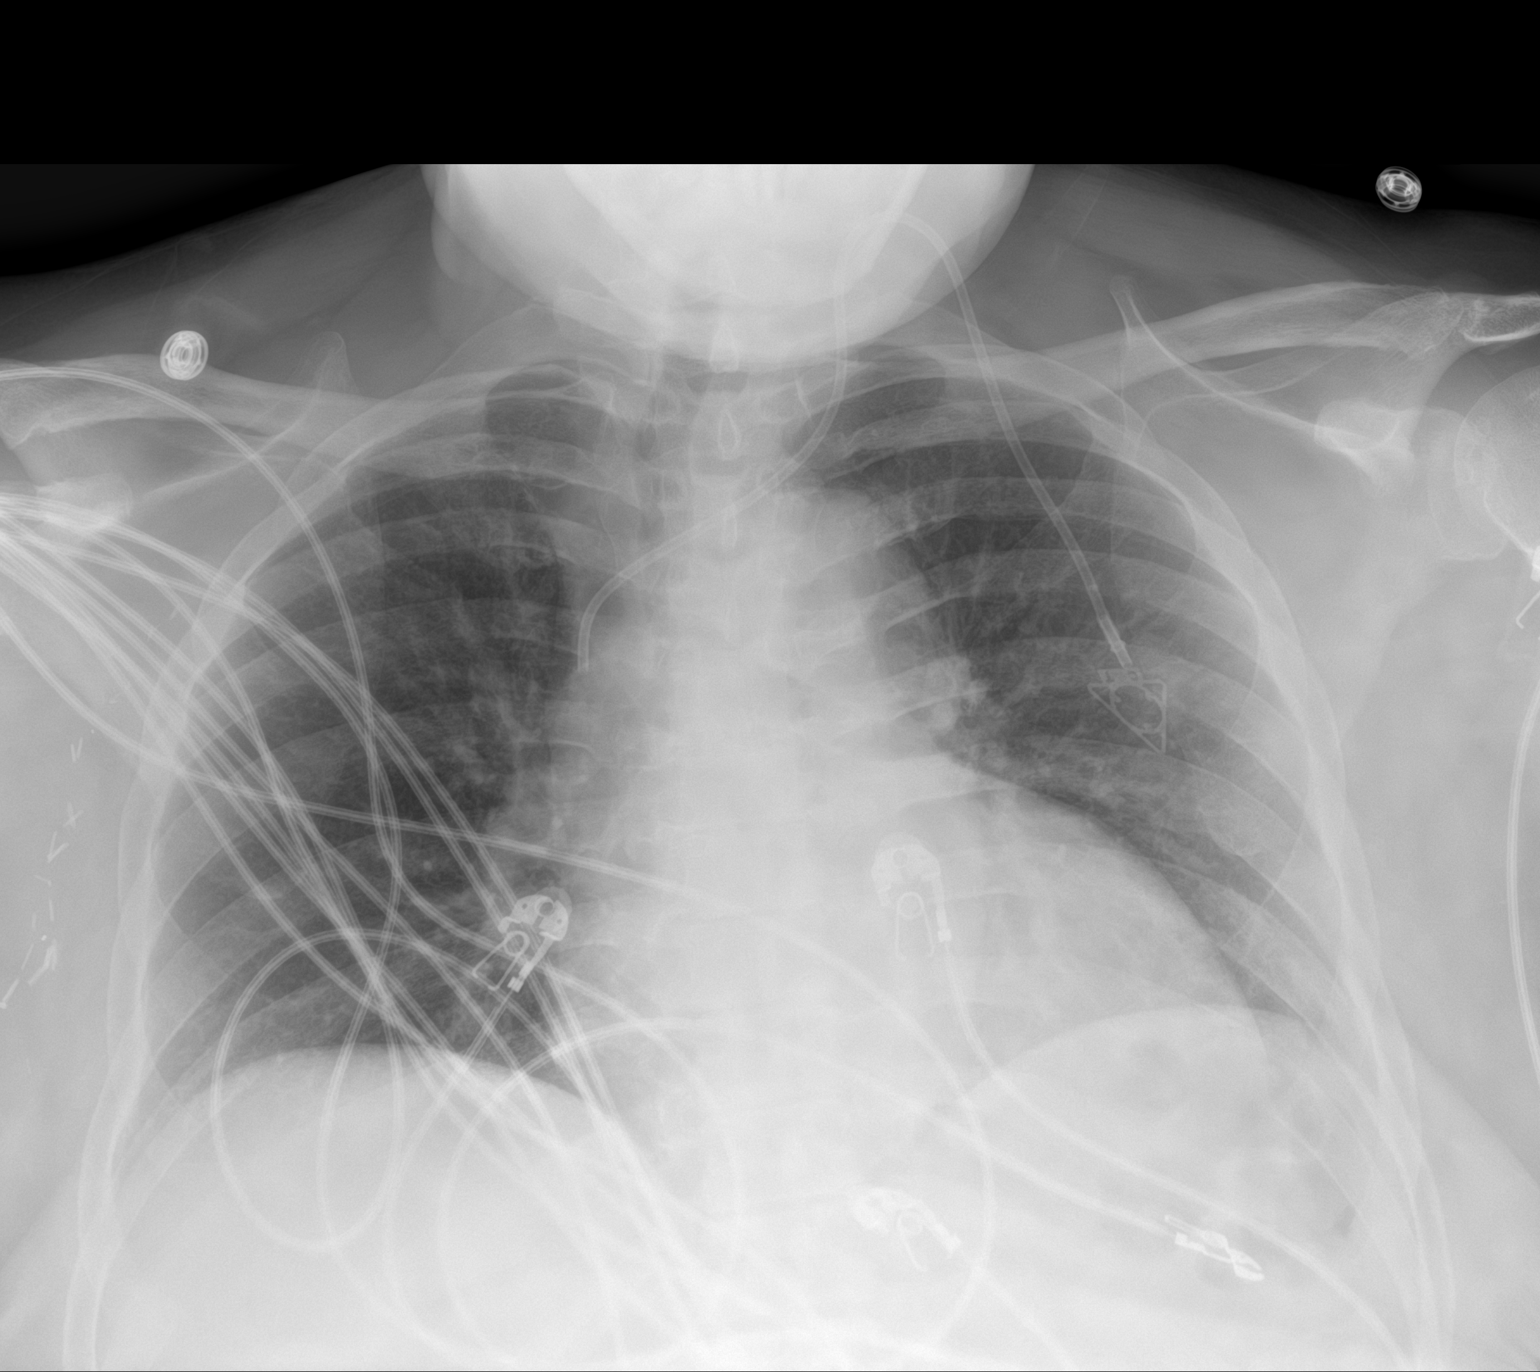

[1 of 1 positions shown; findings below may reference images not displayed]

FINDINGS: Left-sided central venous port tip over the SVC. No focal opacity or
pleural effusion. Borderline cardiomegaly. Low lung volume. No
pneumothorax
IMPRESSION: No active disease. Hypoventilatory change with borderline
cardiomegaly

## 2021-05-19 IMAGING — CT CT HEAD W/O CM
4 series · 15 of 47 positions shown, 17 images · non-contrast
Comparison: Brain MRI [DATE].

CLINICAL DATA: Headache, intracranial hemorrhage suspected.

EXAM:
CT HEAD WITHOUT CONTRAST
TECHNIQUE: Contiguous axial images were obtained from the base of the skull
through the vertex without intravenous contrast.

[Series 2: head wo · axial · 0.43mm/px · z∈[-99,+26]mm · 7 of 35 slices shown, 9 images]
[im 5/35  brain]
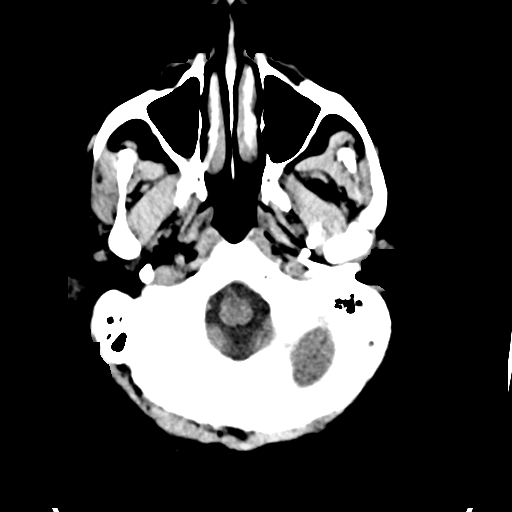
[im 5/35  bone]
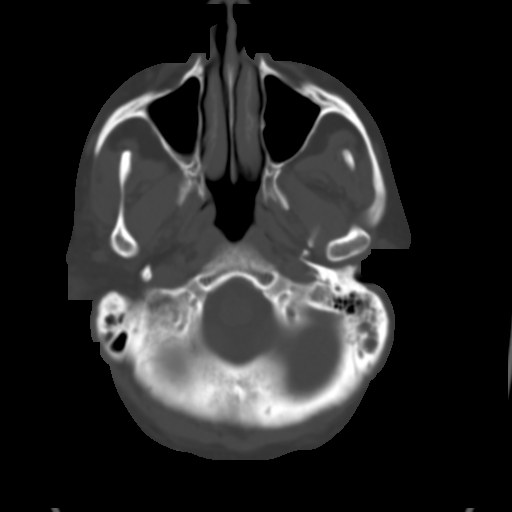
[im 9/35  brain]
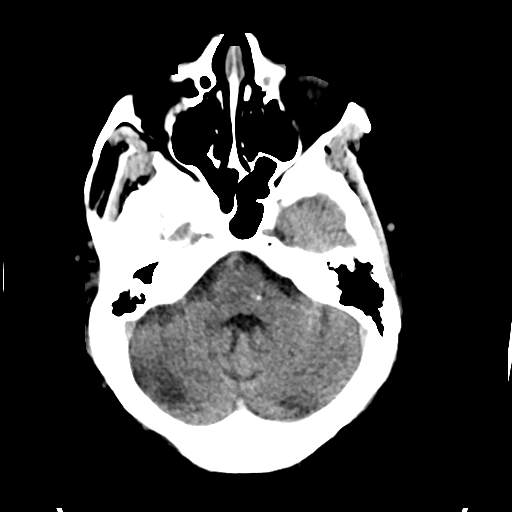
[im 13/35  brain]
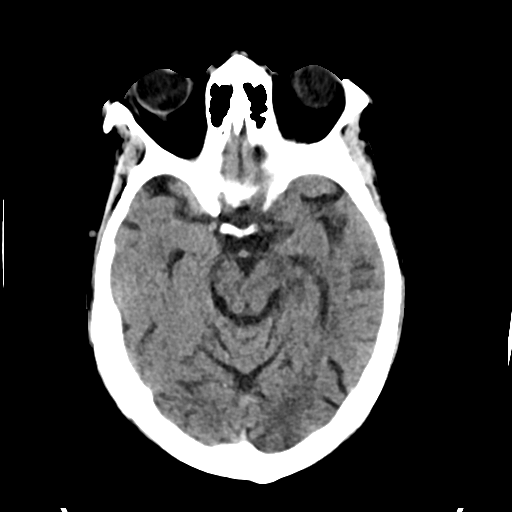
[im 18/35  brain]
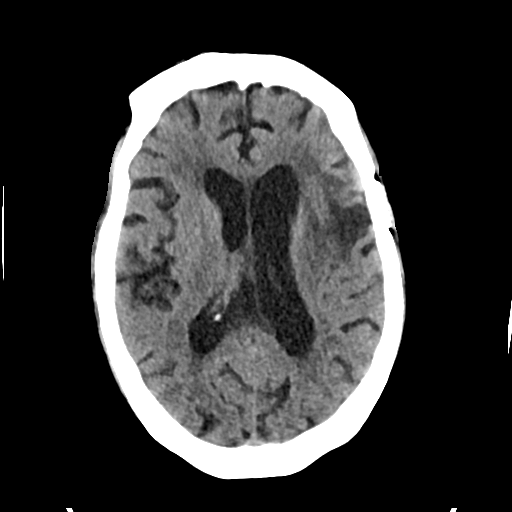
[im 22/35  brain]
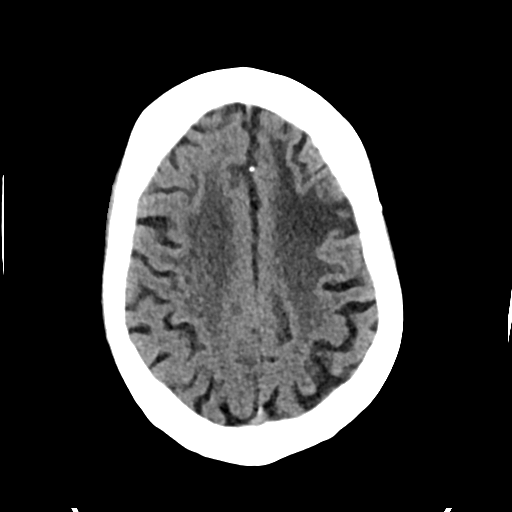
[im 22/35  bone]
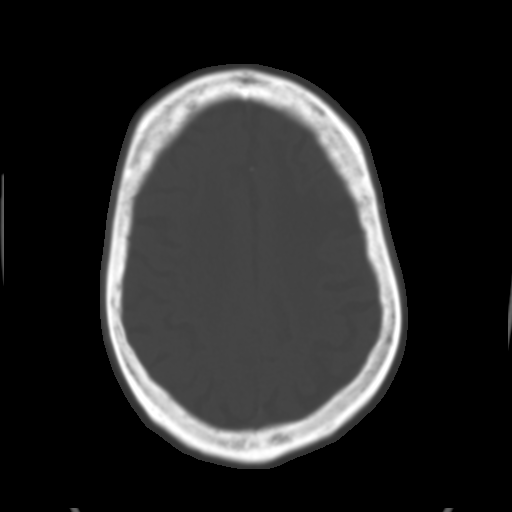
[im 26/35  brain]
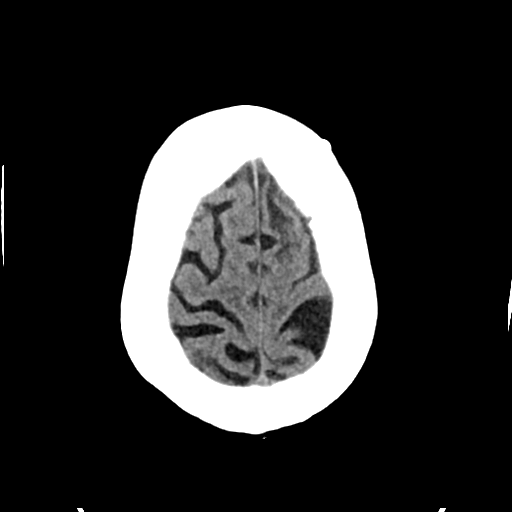
[im 30/35  brain]
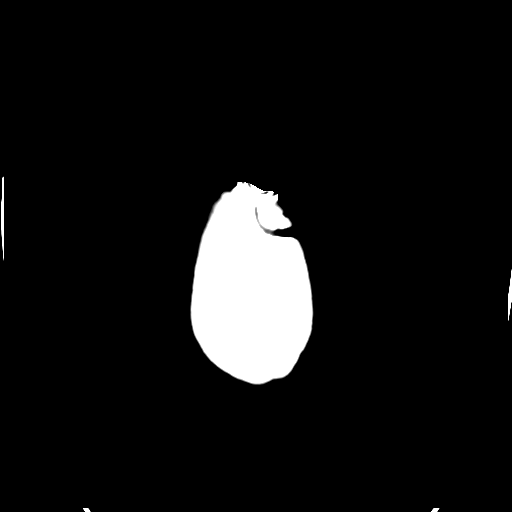

[Series 3: head bone · axial · 0.43mm/px · z∈[-103,-85]mm · 2 of 86 slices shown]
[im 9/86  bone]
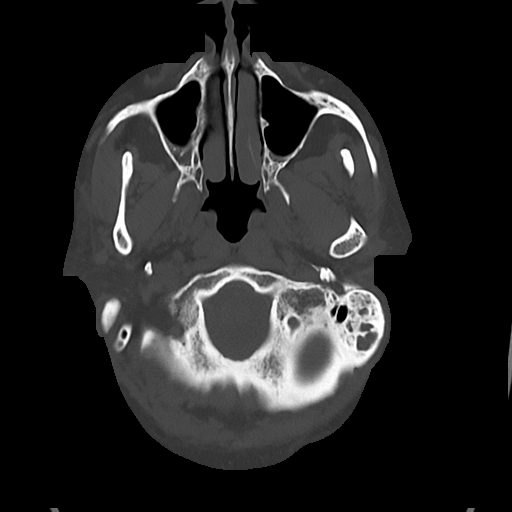
[im 18/86  bone]
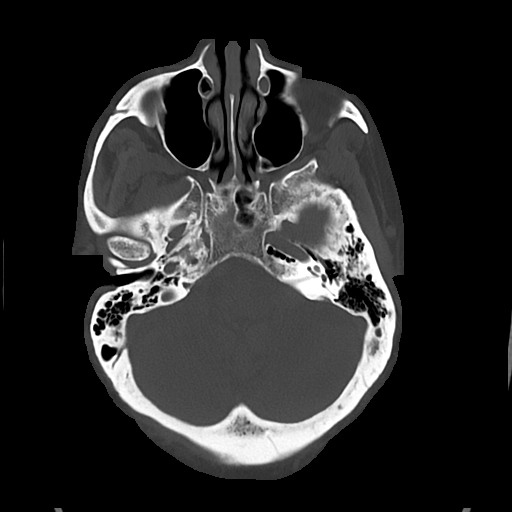

[Series 4: cor soft · coronal · 0.34mm/px · 3 of 74 slices shown]
[im 25/74  brain]
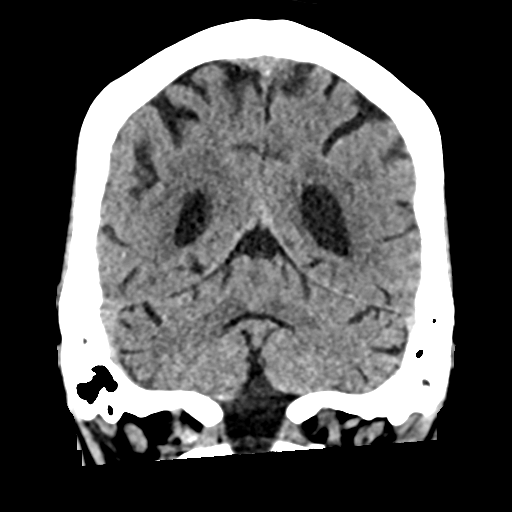
[im 33/74  brain]
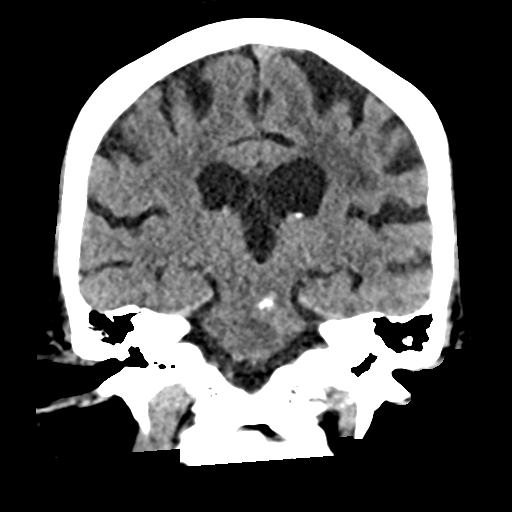
[im 41/74  brain]
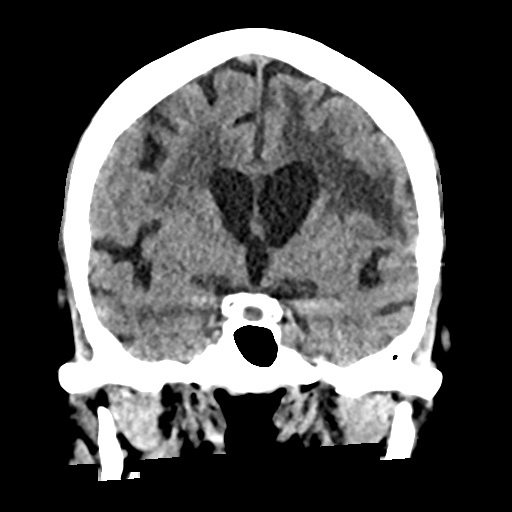

[Series 5: sag soft · sagittal · 0.35mm/px · 3 of 58 slices shown]
[im 20/58  brain]
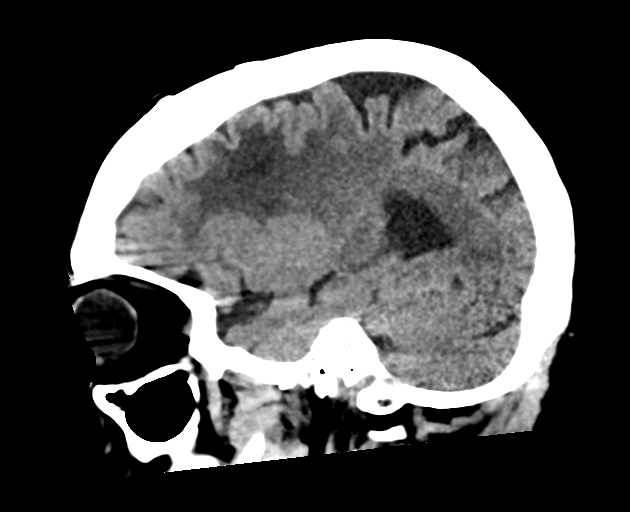
[im 29/58  brain]
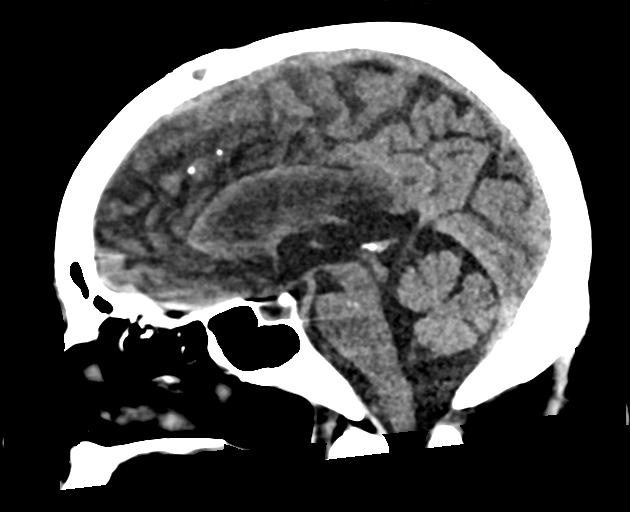
[im 39/58  brain]
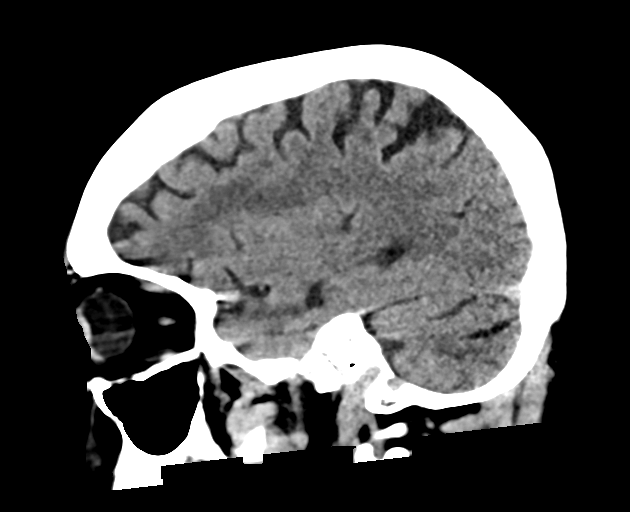

[15 of 47 positions shown; findings below may reference images not displayed]

FINDINGS: Brain:

Mild generalized cerebral and cerebellar atrophy.

Redemonstrated chronic encephalomalacia/gliosis within the left
frontal lobe, deep to left frontal and temporoparietal
cranioplasties.

Background moderate/advanced patchy and ill-defined hypoattenuation
within the cerebral white matter, nonspecific but likely reflecting
a combination of therapy related changes and chronic small vessel
ischemic disease.

There are irregular foci of hyperdensity within the pons, at least
partly (and likely entirely) reflecting calcification (likely
related to the patient's history of whole-brain radiation. However,
these foci are new as compared to the brain MRI of [DATE], and a
short interval follow-up noncontrast head CT is recommended to
ensure stability and to exclude any acute hemorrhage at this site.

No acute demarcated cortical infarct.

No extra-axial fluid collection.

No evidence of an intracranial mass.

No midline shift.

Vascular: No hyperdense vessel.  Atherosclerotic calcifications.

Skull: Redemonstrated left frontal and left temporoparietal
cranioplasties.

Sinuses/Orbits: Visualized orbits show no acute finding. Trace
mucosal thickening within the right sphenoid sinus

Other: Bilateral mastoid effusions.

Findings and recommendations discussed with Dr. MANGAL of the emergency
department by telephone at [DATE] p.m. on [DATE].
IMPRESSION: There are irregular foci of hyperdensity within the pons, at least
partly (and likely entirely) reflecting calcification (a history of
prior whole-brain radiation is noted). However, given the patient's
symptoms and given that these foci are new as compared to the brain
MRI of [DATE], a short-interval follow-up noncontrast head CT is
recommended to ensure stability and to exclude any acute hemorrhage
at this site.

Redemonstrated chronic encephalomalacia and gliosis within the left
frontal lobe, deep to left-sided cranioplasties.

Background moderate/advanced cerebral white matter disease, likely
reflecting a combination of treatment-related changes and chronic
small vessel ischemia.

Mild generalized parenchymal atrophy.

Minimal right sphenoid sinus mucosal thickening.

Bilateral mastoid effusions.

## 2021-05-19 MED ORDER — ACETAMINOPHEN 325 MG PO TABS
650.0000 mg | ORAL_TABLET | Freq: Once | ORAL | Status: AC
  Administered 2021-05-19: 650 mg via ORAL
  Filled 2021-05-19: qty 2

## 2021-05-19 NOTE — ED Triage Notes (Signed)
Pt BIB [facility] c/o AMS. Pt had a brain tumor removed and has cognitive issues since, sister states that after she went to a PCP appt, pt began vomiting, had severe HA and increased cognitive issues. Had two episodes of emesis with EMS, given 4mg  zofran. BP 190/110, no hx of HTN. HR 58

## 2021-05-19 NOTE — ED Provider Notes (Signed)
[facility] EMERGENCY DEPARTMENT Provider Note   CSN: [id number] Arrival date & time: 05/19/21  1902     History Chief Complaint  Patient presents with   Altered Mental Status    Monica Mccoy is a 55 y.o. female with PMHx metastatic breast cancer who presents for evaluation of altered mental status.  The patient's sister states that the patient was increasingly confused today, not recognizing family members, very forgetful. She was very sleepy, which is unusual for her. She then had several episodes of nausea and vomiting. She has had decreased appetite. She wouldn't respond appropriately to even basic questions. Last normal this morning at 10AM. She fells backwards onto her bottom on Tuesday.   Of note, she had brain surgery approximately 8 years ago for metastatic breast cancer.     Past Medical History:  Diagnosis Date   Anxiety    Breast cancer metastasized to brain [facility])    Left breast   Chronic pain after cancer treatment    Depression    GERD (gastroesophageal reflux disease)    History of cancer chemotherapy    Breast cancer left breast   Seizure [facility])     Patient Active Problem List   Diagnosis Date Noted   Acute encephalopathy 05/19/2021   Port-A-Cath in place 01/16/2021   Migraine without aura 12/19/2020   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[place]) 09/22/2020   Hx of breast cancer 06/29/2020   Hx of seizure disorder 06/29/2020   Gastroesophageal reflux disease without esophagitis 06/29/2020   Other chronic pain 06/29/2020   Moderate episode of recurrent major depressive disorder ([place]) 06/29/2020   Urinary incontinence 06/29/2020    Past Surgical History:  Procedure Laterality Date   BRAIN SURGERY     brain tumor   BREAST SURGERY       OB History   No obstetric history on file.     History reviewed. No pertinent family history.  Social History   Tobacco Use   Smoking status: Never   Smokeless tobacco: Never   Vaping Use   Vaping Use: Never used  Substance Use Topics   Alcohol use: Yes    Comment: On occasion   Drug use: Never    Home Medications Prior to Admission medications   Medication Sig Start Date End Date Taking? Authorizing Provider  acetaminophen (TYLENOL) 500 MG tablet Take 1,000 mg by mouth every 6 (six) hours as needed.    [provider]  divalproex (DEPAKOTE) 500 MG DR tablet Take 1 tablet (500 mg total) by mouth 2 (two) times daily. 12/19/20   Ventura Sellers, MD  DULoxetine (CYMBALTA) 60 MG capsule Take 1 capsule (60 mg total) by mouth daily. 03/08/21   Cirigliano, Garvin Fila, DO  gabapentin (NEURONTIN) 300 MG capsule Take 300 mg by mouth 3 (three) times daily.    [provider]  mirabegron ER (MYRBETRIQ) 50 MG TB24 tablet Take 1 tablet (50 mg total) by mouth daily. 02/03/21   Cirigliano, Garvin Fila, DO  traMADol (ULTRAM) 50 MG tablet Take 2 tablets (100 mg total) by mouth 2 (two) times daily as needed. 05/08/21   Nicholas Lose, MD    Allergies    Dilaudid [hydromorphone hcl], Morphine and related, Penicillins, and Tramadol  Review of Systems   Review of Systems  Unable to perform ROS: Mental status change   Physical Exam Updated Vital Signs BP (!) 147/102   Pulse (!) 58   Temp 97.6 F (36.4 C) (Oral)  Resp (!) 23   Ht 5\' 5"  (1.651 m)   Wt 130.6 kg   SpO2 100%   BMI 47.93 kg/m   Physical Exam Vitals and nursing note reviewed.  Constitutional:      General: She is not in acute distress.    Appearance: She is well-developed. She is obese.  HENT:     Head: Normocephalic and atraumatic.  Eyes:     Extraocular Movements: Extraocular movements intact.     Conjunctiva/sclera: Conjunctivae normal.     Pupils: Pupils are equal, round, and reactive to light.  Cardiovascular:     Rate and Rhythm: Regular rhythm. Bradycardia present.     Pulses: Normal pulses.     Heart sounds: Normal heart sounds. No murmur heard. Pulmonary:     Effort: Pulmonary  effort is normal. No respiratory distress.     Breath sounds: Normal breath sounds.  Abdominal:     Palpations: Abdomen is soft.     Tenderness: There is no abdominal tenderness.     Comments: Soft, non-tender, non-distended. No rebound or guarding.   Musculoskeletal:        General: Normal range of motion.     Cervical back: Neck supple.  Skin:    General: Skin is warm and dry.     Capillary Refill: Capillary refill takes less than 2 seconds.  Neurological:     General: No focal deficit present.     Mental Status: She is disoriented.     Cranial Nerves: No cranial nerve deficit.     Sensory: No sensory deficit.     Motor: No weakness.    ED Results / Procedures / Treatments   Labs (all labs ordered are listed, but only abnormal results are displayed) Labs Reviewed  COMPREHENSIVE METABOLIC PANEL - Abnormal; Notable for the following components:      Result Value   Glucose, Bld 102 (*)    All other components within normal limits  URINALYSIS, COMPLETE (UACMP) WITH MICROSCOPIC - Abnormal; Notable for the following components:   pH 9.0 (*)    All other components within normal limits  I-STAT VENOUS BLOOD GAS, ED - Abnormal; Notable for the following components:   pH, Ven 7.568 (*)    pCO2, Ven 31.1 (*)    Bicarbonate 28.4 (*)    Acid-Base Excess 7.0 (*)    Calcium, Ion 0.99 (*)    All other components within normal limits  CBC WITH DIFFERENTIAL/PLATELET  AMMONIA  ETHANOL  HIV ANTIBODY (ROUTINE TESTING W REFLEX)  VALPROIC ACID LEVEL  CBG MONITORING, ED  I-STAT BETA HCG BLOOD, ED ([facility], [facility], [facility] ONLY)   EKG None  Radiology CT HEAD WO CONTRAST (5MM)  Result Date: 05/19/2021 CLINICAL DATA:  Headache, intracranial hemorrhage suspected. EXAM: CT HEAD WITHOUT CONTRAST TECHNIQUE: Contiguous axial images were obtained from the base of the skull through the vertex without intravenous contrast. COMPARISON:  Brain MRI 10/20/2020. FINDINGS: Brain: Mild generalized cerebral and  cerebellar atrophy. Redemonstrated chronic encephalomalacia/gliosis within the left frontal lobe, deep to left frontal and temporoparietal cranioplasties. Background moderate/advanced patchy and ill-defined hypoattenuation within the cerebral white matter, nonspecific but likely reflecting a combination of therapy related changes and chronic small vessel ischemic disease. There are irregular foci of hyperdensity within the pons, at least partly (and likely entirely) reflecting calcification (likely related to the patient's history of whole-brain radiation. However, these foci are new as compared to the brain MRI of 10/20/2020, and a short interval follow-up noncontrast head CT is recommended  to ensure stability and to exclude any acute hemorrhage at this site. No acute demarcated cortical infarct. No extra-axial fluid collection. No evidence of an intracranial mass. No midline shift. Vascular: No hyperdense vessel.  Atherosclerotic calcifications. Skull: Redemonstrated left frontal and left temporoparietal cranioplasties. Sinuses/Orbits: Visualized orbits show no acute finding. Trace mucosal thickening within the right sphenoid sinus Other: Bilateral mastoid effusions. Findings and recommendations discussed with Dr. Darl Householder of the emergency department by telephone at 8:55 p.m. on 05/19/2021. IMPRESSION: There are irregular foci of hyperdensity within the pons, at least partly (and likely entirely) reflecting calcification (a history of prior whole-brain radiation is noted). However, given the patient's symptoms and given that these foci are new as compared to the brain MRI of 10/20/2020, a short-interval follow-up noncontrast head CT is recommended to ensure stability and to exclude any acute hemorrhage at this site. Redemonstrated chronic encephalomalacia and gliosis within the left frontal lobe, deep to left-sided cranioplasties. Background moderate/advanced cerebral white matter disease, likely reflecting a  combination of treatment-related changes and chronic small vessel ischemia. Mild generalized parenchymal atrophy. Minimal right sphenoid sinus mucosal thickening. Bilateral mastoid effusions. Electronically Signed   By: Kellie Simmering D.O.   On: 05/19/2021 21:09   DG Chest Port 1 View  Result Date: 05/19/2021 CLINICAL DATA:  Altered mental status EXAM: PORTABLE CHEST 1 VIEW COMPARISON:  10/24/2020 FINDINGS: Left-sided central venous port tip over the SVC. No focal opacity or pleural effusion. Borderline cardiomegaly. Low lung volume. No pneumothorax IMPRESSION: No active disease. Hypoventilatory change with borderline cardiomegaly Electronically Signed   By: Donavan Foil M.D.   On: 05/19/2021 19:50    Procedures Procedures   Medications Ordered in ED Medications  0.9 %  sodium chloride infusion (has no administration in time range)  acetaminophen (TYLENOL) tablet 650 mg (has no administration in time range)    Or  acetaminophen (TYLENOL) suppository 650 mg (has no administration in time range)  acetaminophen (TYLENOL) tablet 650 mg (650 mg Oral Given 05/19/21 2203)  gadobutrol (GADAVIST) 1 MMOL/ML injection 10 mL (10 mLs Intravenous Contrast Given 05/20/21 0230)    ED Course  I have reviewed the triage vital signs and the nursing notes.  Pertinent labs & imaging results that were available during my care of the patient were reviewed by me and considered in my medical decision making (see chart for details).    MDM Rules/Calculators/A&P                           55 y.o. female with past medical history as above who presents for evaluation of altered mental status. Afebrile and hemodynamically stable.  Exam as detailed above.  Initial differential includes (but is not limited to): Infection/Sepsis, seizure, stroke, electrolyte derangement, encephalopathy, uremia, DKA, dehydration, heart block, hemorrhage, ingestion, overdose, withdrawal, intoxication, acute psychosis,  polypharmacy  Trauma seems unlikely given history. Infection seems less likely given no review of systems concerning for infection, exam, afebrile, no leukocytosis, unremarkable urinalysis, and negative chest x-ray. Seizure seems less likely given history, no shaking or movements concerning for seizure, and no reported history of epilepsy.  Stroke or intracranial abnormality seem less likely given history, as well as negative CT head. Acute psychosis seems less likely as patient does not appear to be responding to internal stimuli, does not endorse any suicidal ideation or homicidal does not endorse any auditory visual hallucinations.  Polypharmacy could be a potential etiology as patient is on multiple medications, no recent  dosage changes, medication adjustments or new medications. Ingestion/overdose versus withdrawal versus intoxication seem less likely given history, as well as absence of physical exam findings concerning for ingestion or withdrawal.  Electrolyte abnormality versus encephalopathy versus uremia versus acidosis seem possible but less likely given history, as well as normal electrolytes, bicarbonate, and anion gap.  Normal ammonia level.  VBG without evidence of hypercarbia.  Unclear etiology of the patient's altered mental status.  May be secondary to delirium.  Hospitalist will be admitted for further management.  Final Clinical Impression(s) / ED Diagnoses Final diagnoses:  Nausea & vomiting    Rx / DC Orders ED Discharge Orders     None        Violet Baldy, MD 05/20/21 1321    Drenda Freeze, MD 05/20/21 1515

## 2021-05-19 NOTE — ED Notes (Signed)
X-ray at bedside

## 2021-05-20 ENCOUNTER — Observation Stay (HOSPITAL_COMMUNITY): Payer: Medicare Other

## 2021-05-20 ENCOUNTER — Inpatient Hospital Stay (HOSPITAL_COMMUNITY): Payer: Medicare Other

## 2021-05-20 ENCOUNTER — Encounter (HOSPITAL_COMMUNITY): Payer: Self-pay | Admitting: Internal Medicine

## 2021-05-20 DIAGNOSIS — R569 Unspecified convulsions: Secondary | ICD-10-CM

## 2021-05-20 DIAGNOSIS — Z9221 Personal history of antineoplastic chemotherapy: Secondary | ICD-10-CM | POA: Diagnosis not present

## 2021-05-20 DIAGNOSIS — Z9889 Other specified postprocedural states: Secondary | ICD-10-CM

## 2021-05-20 DIAGNOSIS — Z79899 Other long term (current) drug therapy: Secondary | ICD-10-CM | POA: Diagnosis not present

## 2021-05-20 DIAGNOSIS — R4701 Aphasia: Secondary | ICD-10-CM

## 2021-05-20 DIAGNOSIS — R112 Nausea with vomiting, unspecified: Secondary | ICD-10-CM | POA: Diagnosis present

## 2021-05-20 DIAGNOSIS — F32A Depression, unspecified: Secondary | ICD-10-CM | POA: Diagnosis present

## 2021-05-20 DIAGNOSIS — Z853 Personal history of malignant neoplasm of breast: Secondary | ICD-10-CM | POA: Diagnosis not present

## 2021-05-20 DIAGNOSIS — G43909 Migraine, unspecified, not intractable, without status migrainosus: Secondary | ICD-10-CM | POA: Diagnosis present

## 2021-05-20 DIAGNOSIS — F419 Anxiety disorder, unspecified: Secondary | ICD-10-CM | POA: Diagnosis present

## 2021-05-20 DIAGNOSIS — C50919 Malignant neoplasm of unspecified site of unspecified female breast: Secondary | ICD-10-CM

## 2021-05-20 DIAGNOSIS — C7931 Secondary malignant neoplasm of brain: Secondary | ICD-10-CM | POA: Diagnosis present

## 2021-05-20 DIAGNOSIS — G43009 Migraine without aura, not intractable, without status migrainosus: Secondary | ICD-10-CM

## 2021-05-20 DIAGNOSIS — Z20822 Contact with and (suspected) exposure to covid-19: Secondary | ICD-10-CM | POA: Diagnosis present

## 2021-05-20 DIAGNOSIS — G9340 Encephalopathy, unspecified: Principal | ICD-10-CM

## 2021-05-20 DIAGNOSIS — K219 Gastro-esophageal reflux disease without esophagitis: Secondary | ICD-10-CM | POA: Diagnosis present

## 2021-05-20 DIAGNOSIS — Z6841 Body Mass Index (BMI) 40.0 and over, adult: Secondary | ICD-10-CM | POA: Diagnosis not present

## 2021-05-20 DIAGNOSIS — Z923 Personal history of irradiation: Secondary | ICD-10-CM | POA: Diagnosis not present

## 2021-05-20 DIAGNOSIS — K819 Cholecystitis, unspecified: Secondary | ICD-10-CM | POA: Diagnosis present

## 2021-05-20 DIAGNOSIS — R001 Bradycardia, unspecified: Secondary | ICD-10-CM | POA: Diagnosis present

## 2021-05-20 LAB — BASIC METABOLIC PANEL
Anion gap: 7 (ref 5–15)
BUN: 8 mg/dL (ref 6–20)
CO2: 28 mmol/L (ref 22–32)
Calcium: 8.9 mg/dL (ref 8.9–10.3)
Chloride: 104 mmol/L (ref 98–111)
Creatinine, Ser: 0.86 mg/dL (ref 0.44–1.00)
GFR, Estimated: >60 mL/min (ref >60)
Glucose, Bld: 89 mg/dL (ref 70–99)
Potassium: 3.2 mmol/L — ABNORMAL LOW (ref 3.5–5.1)
Sodium: 139 mmol/L (ref 135–145)

## 2021-05-20 LAB — GLUCOSE, CAPILLARY
Glucose-Capillary: 102 mg/dL — ABNORMAL HIGH (ref 70–99)
Glucose-Capillary: 135 mg/dL — ABNORMAL HIGH (ref 70–99)

## 2021-05-20 LAB — HIV ANTIBODY (ROUTINE TESTING W REFLEX): HIV Screen 4th Generation wRfx: NONREACTIVE

## 2021-05-20 LAB — CBG MONITORING, ED: Glucose-Capillary: 92 mg/dL (ref 70–99)

## 2021-05-20 LAB — VALPROIC ACID LEVEL: Valproic Acid Lvl: 59 ug/mL (ref 50.0–100.0)

## 2021-05-20 LAB — MAGNESIUM: Magnesium: 1.9 mg/dL (ref 1.7–2.4)

## 2021-05-20 LAB — SARS CORONAVIRUS 2 (TAT 6-24 HRS): SARS Coronavirus 2: NEGATIVE

## 2021-05-20 IMAGING — MR MR HEAD WO/W CM
14 of 20 series · 33 of 48 positions shown · IV contrast (POST CONTRAST)
Comparison: CT from [DATE] and MRI from [DATE].

CLINICAL DATA: Initial evaluation for acute confusion. History of
metastatic breast cancer.

EXAM:
MRI HEAD WITHOUT AND WITH CONTRAST
TECHNIQUE: Multiplanar, multiecho pulse sequences of the brain and surrounding
structures were obtained without and with intravenous contrast.
CONTRAST:  10mL GADAVIST GADOBUTROL 1 MMOL/ML IV SOLN

[Series 5: DWI · axial · 3.0mm · 0.88mm/px · z∈[-153,-15]mm · 4 of 96 slices shown (1 of 4)]
[im 1/96]
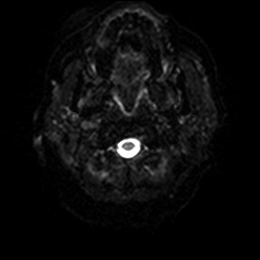
[im 32/96]
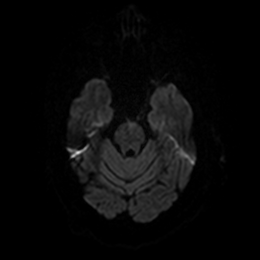
[im 64/96]
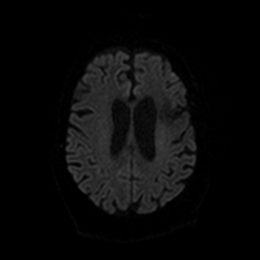
[im 96/96]
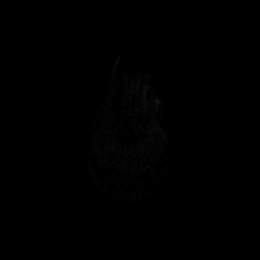

[Series 6: DWI · axial · 3.0mm · 0.88mm/px · z∈[-153,-15]mm · 3 of 48 slices shown (2 of 4)]
[im 1/48]
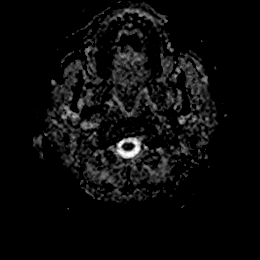
[im 24/48]
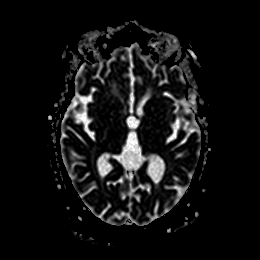
[im 48/48]
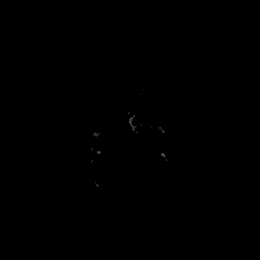

[Series 7: DWI · coronal · 4.0mm · 0.88mm/px · 4 of 70 slices shown (3 of 4)]
[im 1/70]
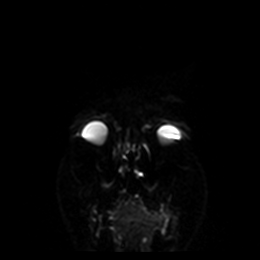
[im 24/70]
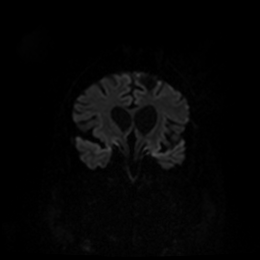
[im 47/70]
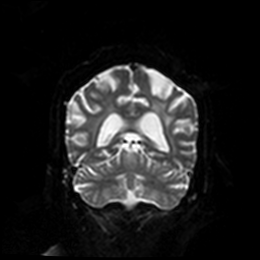
[im 70/70]
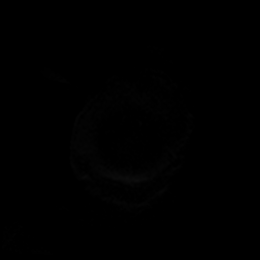

[Series 8: DWI · coronal · 4.0mm · 0.88mm/px · 2 of 35 slices shown (4 of 4)]
[im 1/35]
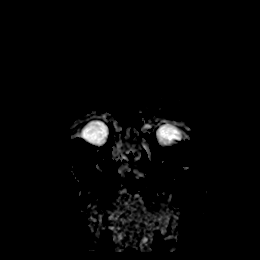
[im 35/35]
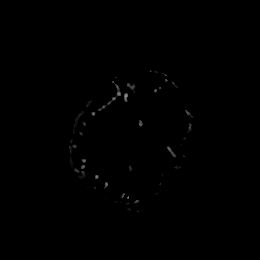

[Series 9: T1 · sagittal · 5.0mm · 0.75mm/px · 1 of 23 slices shown]
[im 1/23]
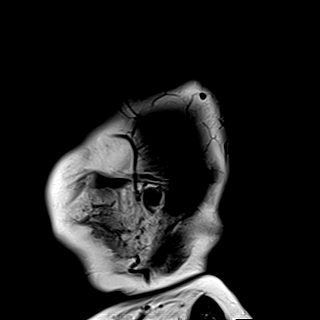

[Series 10: T2 · axial · 5.0mm · 0.72mm/px · 1 of 25 slices shown]
[im 1/25]
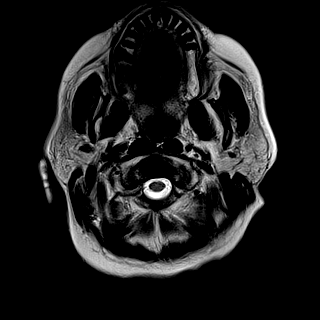

[Series 11: FLAIR · axial · 5.0mm · 0.45mm/px · 1 of 25 slices shown]
[im 1/25]
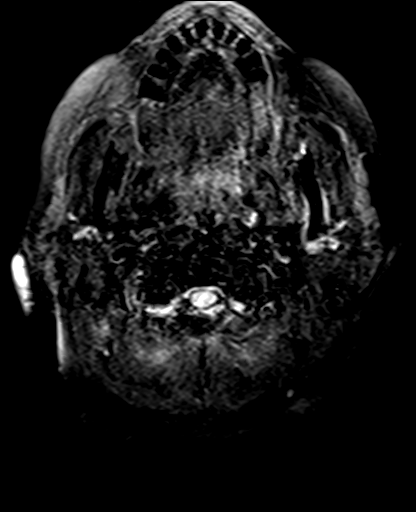

[Series 12: mag_images · axial · 3.0mm · 0.90mm/px · z∈[-164,+10]mm · 3 of 60 slices shown]
[im 1/60]
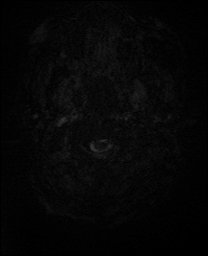
[im 30/60]
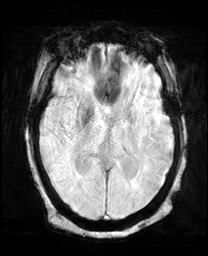
[im 60/60]
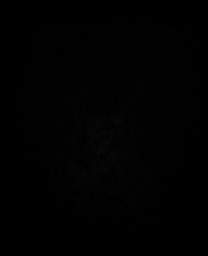

[Series 13: pha_images · axial · 3.0mm · 0.90mm/px · z∈[-164,+10]mm · 3 of 60 slices shown]
[im 1/60]
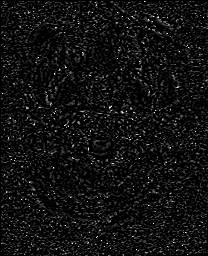
[im 30/60]
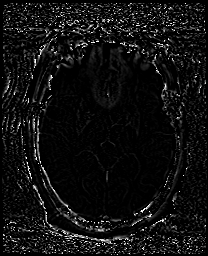
[im 60/60]
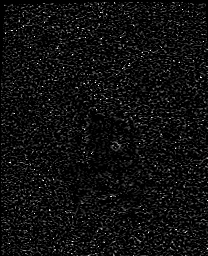

[Series 14: swi_images · axial · 3.0mm · 0.90mm/px · z∈[-164,+10]mm · 3 of 60 slices shown]
[im 1/60]
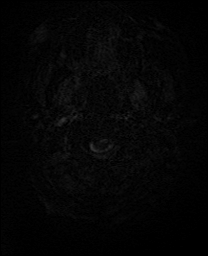
[im 30/60]
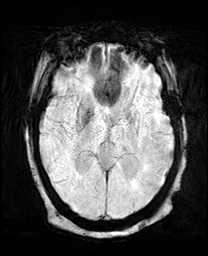
[im 60/60]
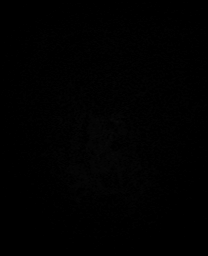

[Series 15: mip_images(sw) · axial · 24.0mm · 0.90mm/px · z∈[-154,-1]mm · 3 of 53 slices shown]
[im 1/53]
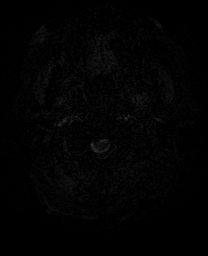
[im 27/53]
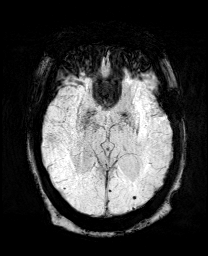
[im 53/53]
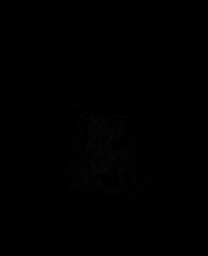

[Series 17: T2 post-contrast · coronal · 5.0mm · 0.72mm/px · 2 of 30 slices shown]
[im 1/30]
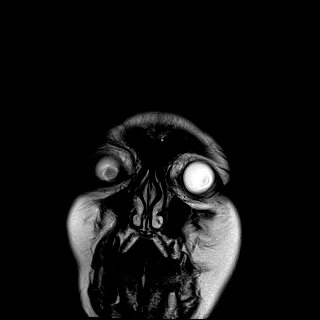
[im 30/30]
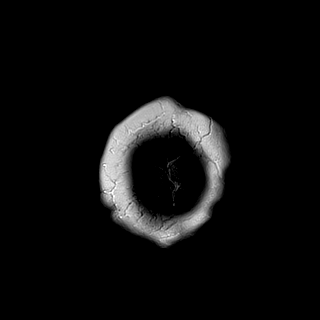

[Series 19: T1 post-contrast · coronal · 5.0mm · 0.34mm/px · 2 of 30 slices shown (1 of 2)]
[im 1/30]
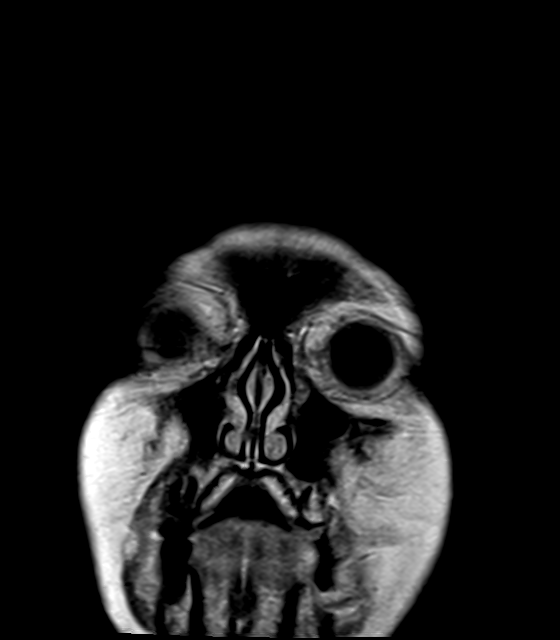
[im 30/30]
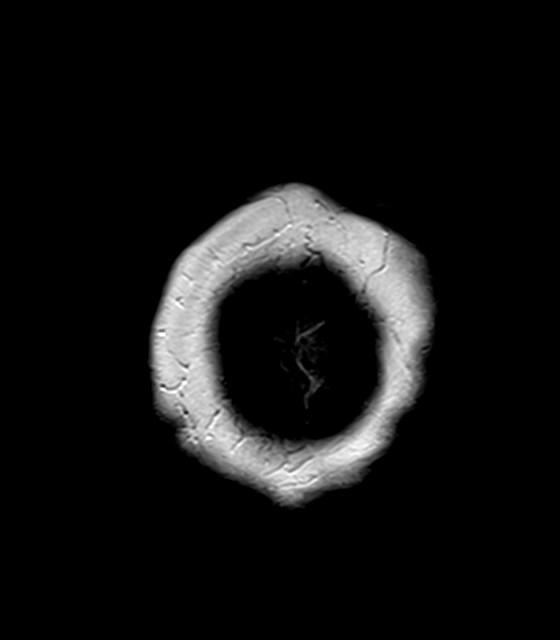

[Series 20: T1 post-contrast · sagittal · 5.0mm · 0.72mm/px · 1 of 23 slices shown (2 of 2)]
[im 1/23]
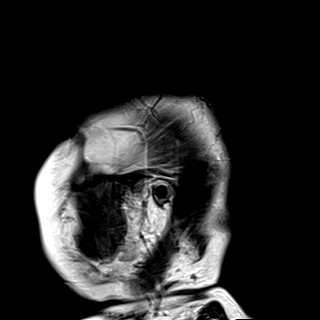

[33 of 48 positions shown; findings below may reference images not displayed]

FINDINGS: Brain: Examination degraded by motion artifact.

No abnormal foci of restricted diffusion to suggest acute or
subacute ischemia. No acute intracranial hemorrhage, with specific
noted no hemorrhage about the pons. Previously noted hyperdensity on
prior CT consistent with calcification/mineralization. Few scattered
chronic micro hemorrhages noted, most pronounced posteriorly, a few
of which are new as compared to prior MRI. Findings are presumably
related to posttreatment changes.

Diffuse prominence of the CSF containing spaces compatible with
atrophy. Encephalomalacia and gliosis involving the left frontal
region noted, stable from previous. Additional hazy and confluent
T2/FLAIR hyperintensity involving the supratentorial cerebral white
matter most consistent with post treatment changes, also similar.

No mass lesion or abnormal enhancement. No mass effect or midline
shift. Mild ventricular prominence with slight ex vacuo dilatation
of the left lateral ventricle without hydrocephalus, stable. No
extra-axial fluid collection.

Vascular: Major intracranial vascular flow voids are maintained.

Skull and upper cervical spine: Craniocervical junction within
normal limits. Bone marrow signal intensity normal. No focal marrow
replacing lesion. Postoperative changes from prior left craniotomy
noted. No acute scalp soft tissue abnormality.

Sinuses/Orbits: Globes and orbital soft tissues demonstrate no acute
finding. Patient status post ocular lens replacement on the left.
Paranasal sinuses are largely clear. Small to moderate bilateral
mastoid effusions, similar to previous, and likely chronic.

Other: None.
IMPRESSION: 1. No acute intracranial abnormality. Parenchymal hyperdensity noted
on prior CT most consistent with calcification/mineralization.
2. Postoperative changes from prior left frontal craniotomy with
underlying encephalomalacia/gliosis.
3. Atrophy with advanced cerebral white matter changes elsewhere
within the brain, likely reflecting post treatment changes. Few
scattered associated chronic microhemorrhages and/or calcifications,
a few of which are new as compared to previous MRI from [DATE].

## 2021-05-20 IMAGING — US US ABDOMEN LIMITED
1 series · 14 of 25 positions shown · non-contrast
Comparison: CT from [DATE].

CLINICAL DATA: Initial evaluation for nausea, vomiting.

EXAM:
ULTRASOUND ABDOMEN LIMITED RIGHT UPPER QUADRANT

[Series 1: us abdomen limited ruq (liver/gb) · 14 of 31 slices shown]
[im 1/31]
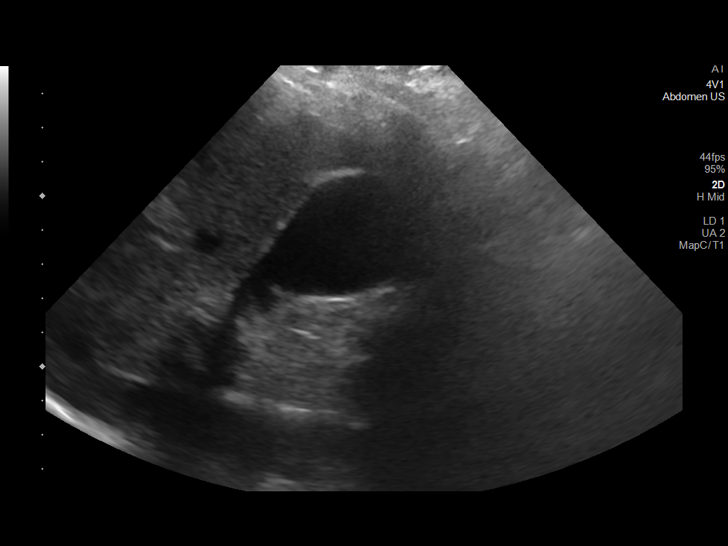
[im 3/31]
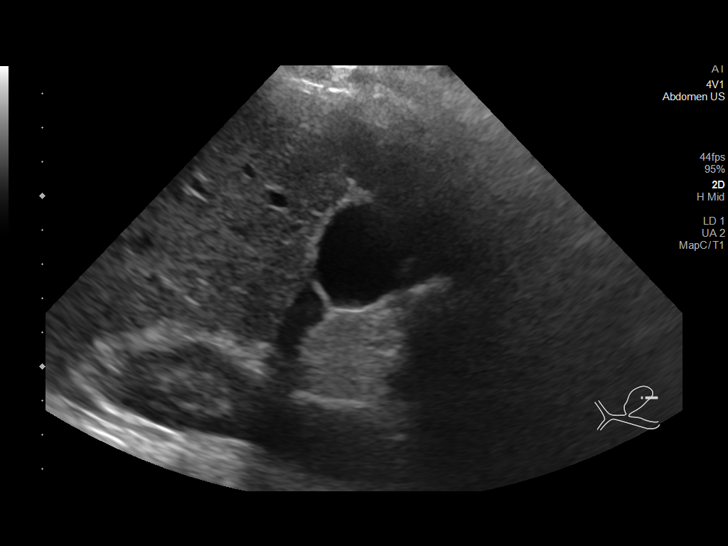
[im 6/31]
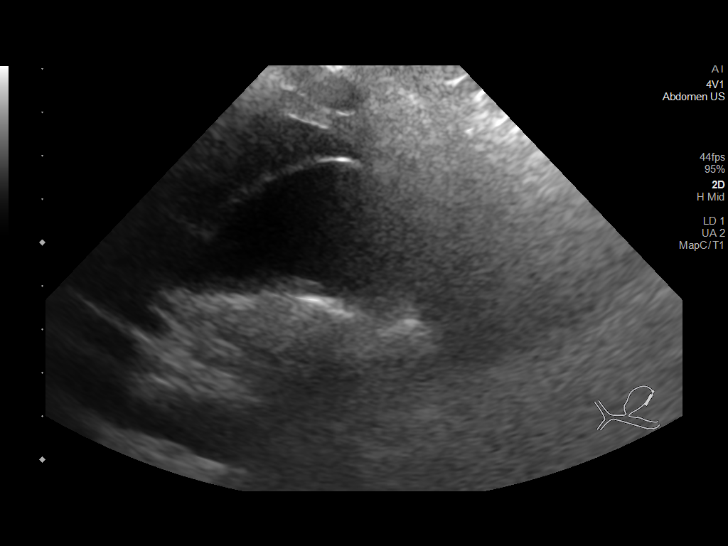
[im 8/31]
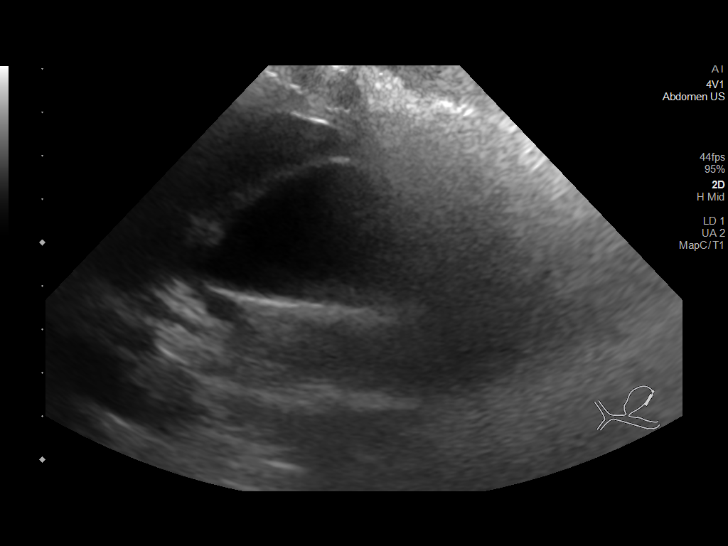
[im 11/31]
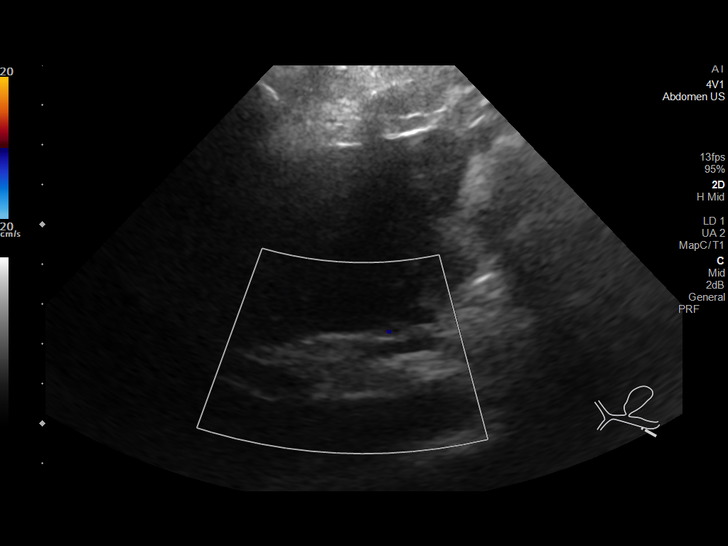
[im 12/31]
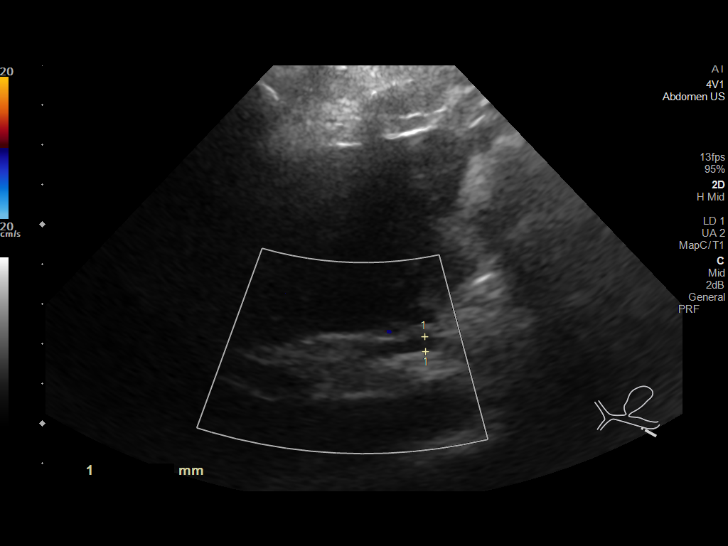
[im 14/31]
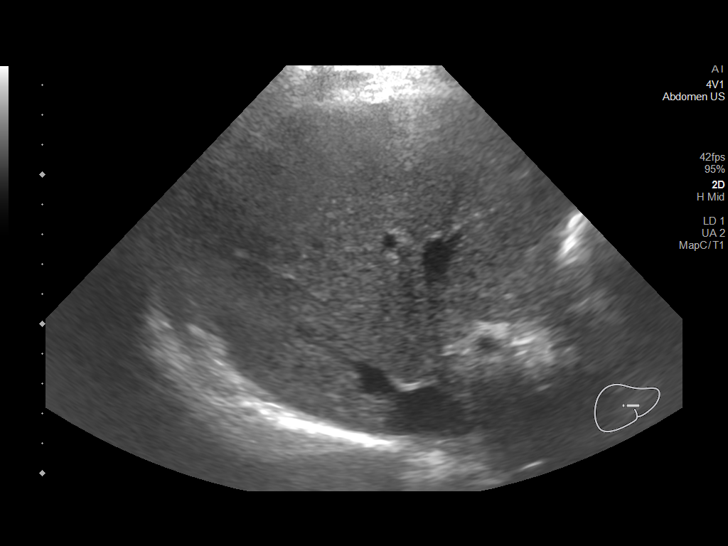
[im 17/31]
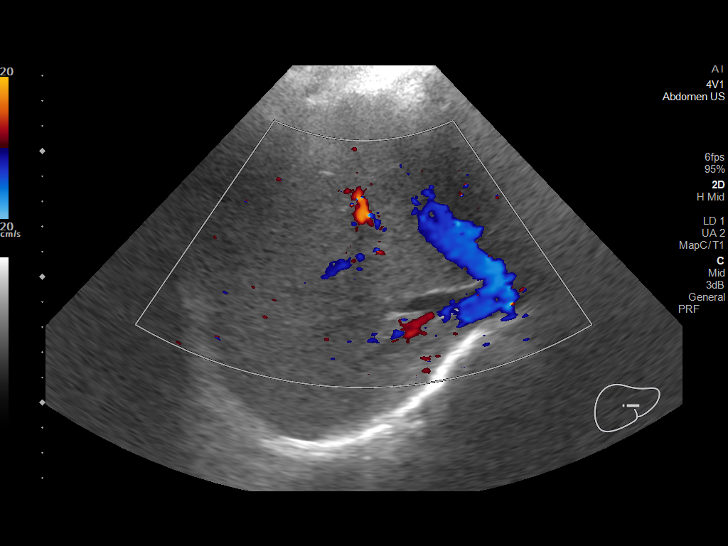
[im 19/31]
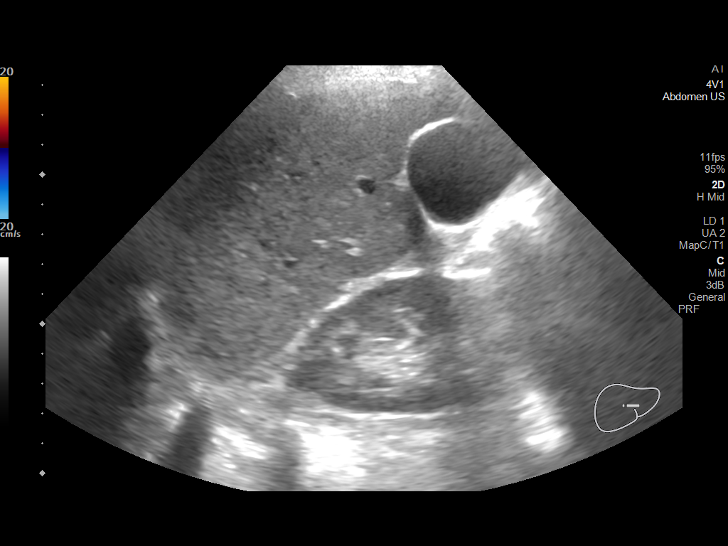
[im 21/31]
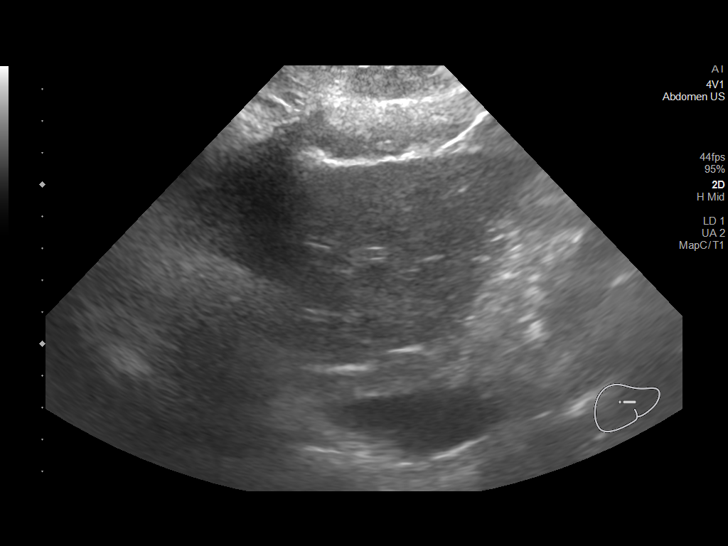
[im 23/31]
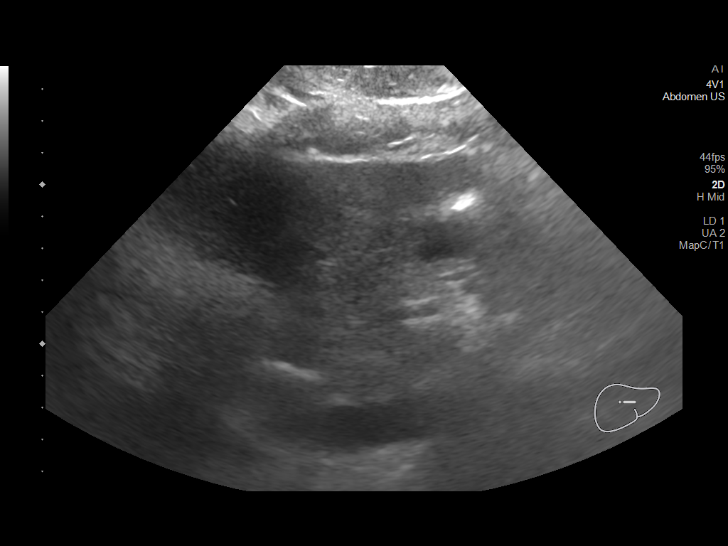
[im 26/31]
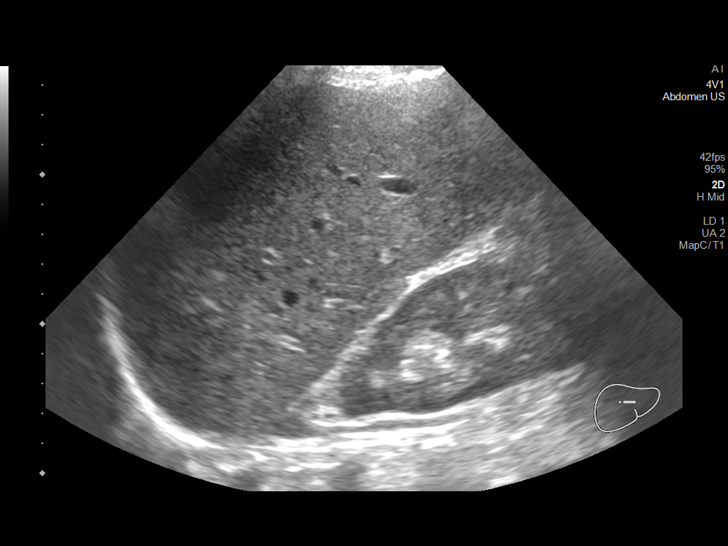
[im 28/31]
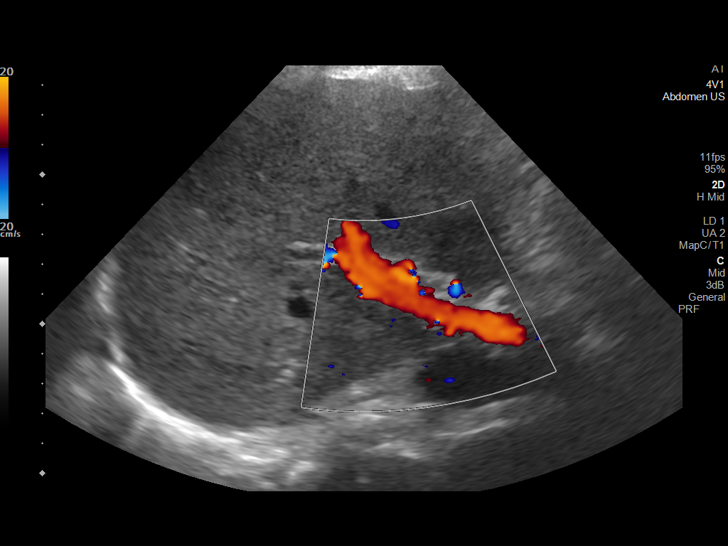
[im 31/31]
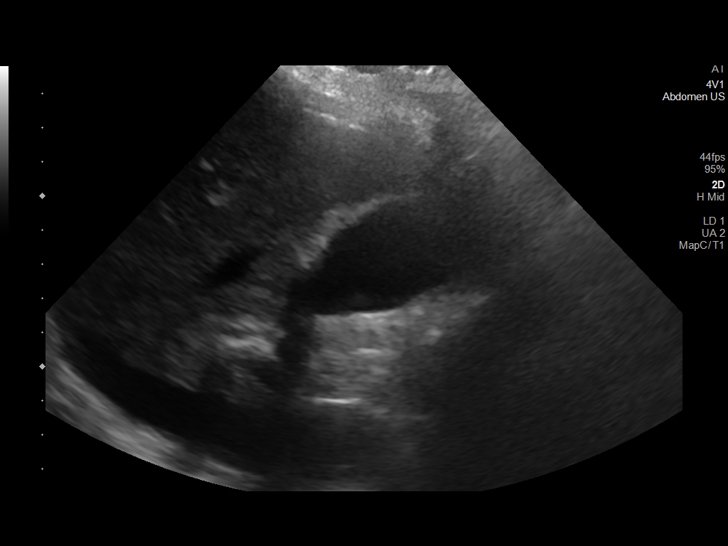

[14 of 25 positions shown; findings below may reference images not displayed]

FINDINGS: Gallbladder:

Small amount of layering sludge seen within the gallbladder lumen.
No shadowing echogenic stones. Gallbladder wall measures within
normal limits at 2.9 mm. No significant free pericholecystic fluid.
No sonographic Murphy sign elicited on exam.

Common bile duct:

Diameter: 3.8 mm

Liver:

No focal lesion identified. Within normal limits in parenchymal
echogenicity. Portal vein is patent on color Doppler imaging with
normal direction of blood flow towards the liver.

Other: None.
IMPRESSION: 1. Gallbladder sludge. No cholelithiasis or evidence for acute
cholecystitis.
2. No biliary dilatation.
3. Normal sonographic appearance of the liver.

## 2021-05-20 MED ORDER — CHLORHEXIDINE GLUCONATE CLOTH 2 % EX PADS
6.0000 | MEDICATED_PAD | Freq: Every day | CUTANEOUS | Status: DC
  Administered 2021-05-20 – 2021-05-21 (×2): 6 via TOPICAL

## 2021-05-20 MED ORDER — MIRABEGRON ER 25 MG PO TB24
50.0000 mg | ORAL_TABLET | Freq: Every day | ORAL | Status: DC
  Administered 2021-05-20 – 2021-05-21 (×2): 50 mg via ORAL
  Filled 2021-05-20: qty 2
  Filled 2021-05-20: qty 1
  Filled 2021-05-20: qty 2
  Filled 2021-05-20: qty 1

## 2021-05-20 MED ORDER — SODIUM CHLORIDE 0.9 % IV SOLN: INTRAVENOUS | Status: DC

## 2021-05-20 MED ORDER — ACETAMINOPHEN 325 MG PO TABS: 650.0000 mg | ORAL_TABLET | Freq: Four times a day (QID) | ORAL | Status: DC | PRN

## 2021-05-20 MED ORDER — GADOBUTROL 1 MMOL/ML IV SOLN
10.0000 mL | Freq: Once | INTRAVENOUS | Status: AC | PRN
  Administered 2021-05-20: 10 mL via INTRAVENOUS

## 2021-05-20 MED ORDER — ACETAMINOPHEN 650 MG RE SUPP: 650.0000 mg | Freq: Four times a day (QID) | RECTAL | Status: DC | PRN

## 2021-05-20 MED ORDER — DIVALPROEX SODIUM 250 MG PO DR TAB
500.0000 mg | DELAYED_RELEASE_TABLET | Freq: Two times a day (BID) | ORAL | Status: DC
  Administered 2021-05-20 – 2021-05-21 (×3): 500 mg via ORAL
  Filled 2021-05-20 (×3): qty 2

## 2021-05-20 MED ORDER — GABAPENTIN 300 MG PO CAPS
300.0000 mg | ORAL_CAPSULE | Freq: Three times a day (TID) | ORAL | Status: DC
  Administered 2021-05-20 – 2021-05-21 (×3): 300 mg via ORAL
  Filled 2021-05-20 (×3): qty 1

## 2021-05-20 MED ORDER — CALCIUM CARBONATE 1250 (500 CA) MG PO TABS
1.0000 | ORAL_TABLET | Freq: Three times a day (TID) | ORAL | Status: DC
  Administered 2021-05-20 – 2021-05-21 (×4): 500 mg via ORAL
  Filled 2021-05-20 (×4): qty 1

## 2021-05-20 MED ORDER — DULOXETINE HCL 60 MG PO CPEP
60.0000 mg | ORAL_CAPSULE | Freq: Every day | ORAL | Status: DC
  Administered 2021-05-20 – 2021-05-21 (×2): 60 mg via ORAL
  Filled 2021-05-20 (×2): qty 1

## 2021-05-20 MED ORDER — POTASSIUM CHLORIDE CRYS ER 20 MEQ PO TBCR
40.0000 meq | EXTENDED_RELEASE_TABLET | Freq: Once | ORAL | Status: AC
  Administered 2021-05-20: 40 meq via ORAL
  Filled 2021-05-20: qty 2

## 2021-05-20 NOTE — Plan of Care (Signed)
Same-day note Patient seen earlier in the morning by Dr. Theda Sers. Examination essentially unchanged from before. EEG with breach artifact in the left frontal region and intermittent generalized slowing.  Suggestive of cortical dysfunction in the left frontal region likely from prior craniotomy. Will continue 24 hours of long-term EEG to rule out underlying intermittent seizures.  If negative in 24 hours, will discontinue. Continue Depakote and gabapentin at home doses Will follow Discussed with primary hospitalist over the phone   -- Amie Portland, MD Neurologist [facility] Pager: [phone]  No charge

## 2021-05-20 NOTE — Progress Notes (Signed)
EEG done at bedside. Results pending from [facility].

## 2021-05-20 NOTE — ED Notes (Signed)
Unable to give ordered calcium due to it not yet being verified by pharmacy.

## 2021-05-20 NOTE — Procedures (Signed)
Patient Name: Monica Mccoy  MRN: [id number]  Epilepsy Attending: Lora Havens  Referring Physician/Provider: Dr Lynnae Sandhoff Date: 05/20/2021 Duration: 24.08 mins  Patient history:  55 y.o. 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 Chart review revealed there may be a history of seizure and she was previously on LEV which she stopped on her own without reported seizures. She was then started on VPA for migraine and mood in setting of presumed seizure. EEG to evaluate for seizure  Level of alertness: Awake  AEDs during EEG study: None  Technical aspects: This EEG study was done with scalp electrodes positioned according to the 10-20 International system of electrode placement. Electrical activity was acquired at a sampling rate of 500Hz  and reviewed with a high frequency filter of 70Hz  and a low frequency filter of 1Hz . EEG data were recorded continuously and digitally stored.   Description: The posterior dominant rhythm consists of 8 Hz activity of moderate voltage (25-35 uV) seen predominantly in posterior head regions, symmetric and reactive to eye opening and eye closing. EEG showed continuous polymorphic sharply contoured 3 to 6 Hz theta-delta slowing I left frontal region consistent with breach artifact.There is also intermittent generalized 3-6hz  theta-delta slowing.  Physiologic photic driving was not seen during photic stimulation.  Hyperventilation was not performed.     ABNORMALITY - Breach artifact, left frontal region - Intermittent slow, generalized  IMPRESSION: This study is suggestive of cortical dysfunction arising from left frontal region consistent with prior craniotomy. There is also mild diffuse encephalopathy, nonspecific etiology. No seizures or  definite epileptiform discharges were seen throughout the recording.  Monica Mccoy

## 2021-05-20 NOTE — ED Notes (Signed)
EEG at bedside.

## 2021-05-20 NOTE — Progress Notes (Signed)
PROGRESS NOTE    Monica Mccoy  IEP:[id number] DOB: 07-Dec-1965 DOA: 05/19/2021 PCP: Haydee Salter, MD   Brief Narrative: 55 year old with past medical history significant for metastatic breast cancer followed by Dr. Payton Mccallum, presented to the ED after patient was found to be confused since the morning of admission.  Patient was also vomiting.  Patient has been complaining of some headaches. Evaluation in the ED CT head has a new lesion in the pontine area which appears new when compared to MRI done on February 2022.  UA chest x-ray unremarkable.   Assessment & Plan:   Active Problems:   Cancer of right breast metastatic to brain [facility])   Acute encephalopathy  1-Acute Encephalopathy Unclear etiology. Plan to replete calcium, orally wont do  IV due to bradycardia MRI:No acute intracranial abnormality. Parenchymal hyperdensity noted on prior CT most consistent with calcification/mineralization. Postoperative changes from prior left frontal craniotomy with underlying encephalomalacia/gliosis. Atrophy with advanced cerebral white matter changes elsewhere within the brain, likely reflecting post treatment changes. Few scattered associated chronic microhemorrhages and/or calcifications, a few of which are new as compared to previous MRI from 10/20/2020 -plan for continuous EEG.  -Will resume Depakote, gabapentin. Per sister she was on some medications for seizure.   2-Nausea, vomiting: -Vomited the day of admission.  No further vomiting here in the hospital. -Recent oncologist no patient has some cholecystitis and was referred to surgery. -Liver function test normal. -[place]; Gallbladder sludge. No cholelithiasis or evidence for acute cholecystitis. No biliary dilatation. Normal sonographic appearance of the liver. Monitor clinically  3-Hypocalcemia:  replete orally 4-hypokalemia: Replete orally 5-History of Chronic Headaches; resume gabapentin.   6-History of breast cancer, metastasis to  brain S/p surgery and radiation. Sx Will add Dr Lindi Adie to rounding team.    Estimated body mass index is 47.93 kg/m as calculated from the following:   Height as of this encounter: 5\' 5"  (1.651 m).   Weight as of this encounter: 130.6 kg.   DVT prophylaxis: SCD Code Status: Full code Family Communication: Sister over phone  Disposition Plan:  Status is: Inpatient  Remains inpatient appropriate because:Inpatient level of care appropriate due to severity of illness  Dispo: The patient is from: Home              Anticipated d/c is to: Home              Patient currently is not medically stable to d/c.   Difficult to place patient No   Consultants:  Neurology.  Procedures:  EEG continuous  Antimicrobials:    Subjective: She is alert, oriented to self. She didn't know where she was at. Or why  Denies pain or headaches.  Denies abdominal pain or further vomiting.   Objective: Vitals:   05/20/21 0500 05/20/21 0515 05/20/21 0615 05/20/21 0630  BP: 117/79 133/88 135/88 122/81  Pulse: 61 68 60 61  Resp:    18  Temp:      TempSrc:      SpO2: 96% 96% 96% 96%  Weight:      Height:       No intake or output data in the 24 hours ending 05/20/21 1143 Filed Weights   05/19/21 1916  Weight: 130.6 kg    Examination:  General exam: Appears calm and comfortable  Respiratory system: Clear to auscultation. Respiratory effort normal. Cardiovascular system: S1 & S2 heard, RRR. No JVD, murmurs, rubs, gallops or clicks. No pedal edema. Gastrointestinal system: Abdomen is nondistended, soft and nontender.  No organomegaly or masses felt. Normal bowel sounds heard. Central nervous system: Alert and oriented only to self. Follows command.  Extremities: Symmetric 5 x 5 power.   Data Reviewed: I have personally reviewed following labs and imaging studies  CBC: Recent Labs  Lab 05/19/21 1915 05/19/21 2053  WBC 4.2  --   NEUTROABS 2.6  --   HGB 13.0 13.3  HCT 40.1 39.0  MCV  95.2  --   PLT 171  --    Basic Metabolic Panel: Recent Labs  Lab 05/19/21 1915 05/19/21 2053 05/20/21 0900  NA 138 139 139  K 3.9 3.7 3.2*  CL 101  --  104  CO2 27  --  28  GLUCOSE 102*  --  89  BUN 8  --  8  CREATININE 0.89  --  0.86  CALCIUM 9.2  --  8.9  MG  --   --  1.9   GFR: Estimated Creatinine Clearance: 102 mL/min (by C-G formula based on SCr of 0.86 mg/dL). Liver Function Tests: Recent Labs  Lab 05/19/21 1915  AST 16  ALT 11  ALKPHOS 60  BILITOT 0.7  PROT 7.3  ALBUMIN 3.5   No results for input(s): LIPASE, AMYLASE in the last 168 hours. Recent Labs  Lab 05/19/21 1916  AMMONIA 19   Coagulation Profile: No results for input(s): INR, PROTIME in the last 168 hours. Cardiac Enzymes: No results for input(s): CKTOTAL, CKMB, CKMBINDEX, TROPONINI in the last 168 hours. BNP (last 3 results) No results for input(s): PROBNP in the last 8760 hours. HbA1C: No results for input(s): HGBA1C in the last 72 hours. CBG: Recent Labs  Lab 05/19/21 2205 05/20/21 0607  GLUCAP 87 92   Lipid Profile: No results for input(s): CHOL, HDL, LDLCALC, TRIG, CHOLHDL, LDLDIRECT in the last 72 hours. Thyroid Function Tests: No results for input(s): TSH, T4TOTAL, FREET4, T3FREE, THYROIDAB in the last 72 hours. Anemia Panel: No results for input(s): VITAMINB12, FOLATE, FERRITIN, TIBC, IRON, RETICCTPCT in the last 72 hours. Sepsis Labs: No results for input(s): PROCALCITON, LATICACIDVEN in the last 168 hours.  Recent Results (from the past 240 hour(s))  SARS CORONAVIRUS 2 (TAT 6-24 HRS) Nasopharyngeal Nasopharyngeal Swab     Status: None   Collection Time: 05/20/21  4:01 AM   Specimen: Nasopharyngeal Swab  Result Value Ref Range Status   SARS Coronavirus 2 NEGATIVE NEGATIVE Final    Comment: (NOTE) SARS-CoV-2 target nucleic acids are NOT DETECTED.  The SARS-CoV-2 RNA is generally detectable in upper and lower respiratory specimens during the acute phase of infection.  Negative results do not preclude SARS-CoV-2 infection, do not rule out co-infections with other pathogens, and should not be used as the sole basis for treatment or other patient management decisions. Negative results must be combined with clinical observations, patient history, and epidemiological information. The expected result is Negative.  Fact Sheet for Patients: SugarRoll.be  Fact Sheet for Healthcare Providers: https://www.woods-mathews.com/  This test is not yet approved or cleared by the [place] FDA and  has been authorized for detection and/or diagnosis of SARS-CoV-2 by FDA under an Emergency Use Authorization (EUA). This EUA will remain  in effect (meaning this test can be used) for the duration of the COVID-19 declaration under Se ction 564(b)(1) of the Act, 21 U.S.C. section 360bbb-3(b)(1), unless the authorization is terminated or revoked sooner.  Performed at [facility], [place] [address], [place], [place] [address]          Radiology  Studies: CT HEAD WO CONTRAST (5MM)  Result Date: 05/19/2021 CLINICAL DATA:  Headache, intracranial hemorrhage suspected. EXAM: CT HEAD WITHOUT CONTRAST TECHNIQUE: Contiguous axial images were obtained from the base of the skull through the vertex without intravenous contrast. COMPARISON:  Brain MRI 10/20/2020. FINDINGS: Brain: Mild generalized cerebral and cerebellar atrophy. Redemonstrated chronic encephalomalacia/gliosis within the left frontal lobe, deep to left frontal and temporoparietal cranioplasties. Background moderate/advanced patchy and ill-defined hypoattenuation within the cerebral white matter, nonspecific but likely reflecting a combination of therapy related changes and chronic small vessel ischemic disease. There are irregular foci of hyperdensity within the pons, at least partly (and likely entirely) reflecting calcification (likely related to the patient's history of  whole-brain radiation. However, these foci are new as compared to the brain MRI of 10/20/2020, and a short interval follow-up noncontrast head CT is recommended to ensure stability and to exclude any acute hemorrhage at this site. No acute demarcated cortical infarct. No extra-axial fluid collection. No evidence of an intracranial mass. No midline shift. Vascular: No hyperdense vessel.  Atherosclerotic calcifications. Skull: Redemonstrated left frontal and left temporoparietal cranioplasties. Sinuses/Orbits: Visualized orbits show no acute finding. Trace mucosal thickening within the right sphenoid sinus Other: Bilateral mastoid effusions. Findings and recommendations discussed with Dr. Darl Householder of the emergency department by telephone at 8:55 p.m. on 05/19/2021. IMPRESSION: There are irregular foci of hyperdensity within the pons, at least partly (and likely entirely) reflecting calcification (a history of prior whole-brain radiation is noted). However, given the patient's symptoms and given that these foci are new as compared to the brain MRI of 10/20/2020, a short-interval follow-up noncontrast head CT is recommended to ensure stability and to exclude any acute hemorrhage at this site. Redemonstrated chronic encephalomalacia and gliosis within the left frontal lobe, deep to left-sided cranioplasties. Background moderate/advanced cerebral white matter disease, likely reflecting a combination of treatment-related changes and chronic small vessel ischemia. Mild generalized parenchymal atrophy. Minimal right sphenoid sinus mucosal thickening. Bilateral mastoid effusions. Electronically Signed   By: Kellie Simmering D.O.   On: 05/19/2021 21:09   MR BRAIN W WO CONTRAST  Result Date: 05/20/2021 CLINICAL DATA:  Initial evaluation for acute confusion. History of metastatic breast cancer. EXAM: MRI HEAD WITHOUT AND WITH CONTRAST TECHNIQUE: Multiplanar, multiecho pulse sequences of the brain and surrounding structures were  obtained without and with intravenous contrast. CONTRAST:  61mL GADAVIST GADOBUTROL 1 MMOL/ML IV SOLN COMPARISON:  CT from 05/19/2021 and MRI from 10/20/2020. FINDINGS: Brain: Examination degraded by motion artifact. No abnormal foci of restricted diffusion to suggest acute or subacute ischemia. No acute intracranial hemorrhage, with specific noted no hemorrhage about the pons. Previously noted hyperdensity on prior CT consistent with calcification/mineralization. Few scattered chronic micro hemorrhages noted, most pronounced posteriorly, a few of which are new as compared to prior MRI. Findings are presumably related to posttreatment changes. Diffuse prominence of the CSF containing spaces compatible with atrophy. Encephalomalacia and gliosis involving the left frontal region noted, stable from previous. Additional hazy and confluent T2/FLAIR hyperintensity involving the supratentorial cerebral white matter most consistent with post treatment changes, also similar. No mass lesion or abnormal enhancement. No mass effect or midline shift. Mild ventricular prominence with slight ex vacuo dilatation of the left lateral ventricle without hydrocephalus, stable. No extra-axial fluid collection. Vascular: Major intracranial vascular flow voids are maintained. Skull and upper cervical spine: Craniocervical junction within normal limits. Bone marrow signal intensity normal. No focal marrow replacing lesion. Postoperative changes from prior left craniotomy noted. No acute scalp soft  tissue abnormality. Sinuses/Orbits: Globes and orbital soft tissues demonstrate no acute finding. Patient status post ocular lens replacement on the left. Paranasal sinuses are largely clear. Small to moderate bilateral mastoid effusions, similar to previous, and likely chronic. Other: None. IMPRESSION: 1. No acute intracranial abnormality. Parenchymal hyperdensity noted on prior CT most consistent with calcification/mineralization. 2.  Postoperative changes from prior left frontal craniotomy with underlying encephalomalacia/gliosis. 3. Atrophy with advanced cerebral white matter changes elsewhere within the brain, likely reflecting post treatment changes. Few scattered associated chronic microhemorrhages and/or calcifications, a few of which are new as compared to previous MRI from 10/20/2020. Electronically Signed   By: Jeannine Boga M.D.   On: 05/20/2021 03:30   DG Chest Port 1 View  Result Date: 05/19/2021 CLINICAL DATA:  Altered mental status EXAM: PORTABLE CHEST 1 VIEW COMPARISON:  10/24/2020 FINDINGS: Left-sided central venous port tip over the SVC. No focal opacity or pleural effusion. Borderline cardiomegaly. Low lung volume. No pneumothorax IMPRESSION: No active disease. Hypoventilatory change with borderline cardiomegaly Electronically Signed   By: Donavan Foil M.D.   On: 05/19/2021 19:50   EEG adult  Result Date: 05/20/2021 Lora Havens, MD     05/20/2021  9:18 AM Patient Name: Monica Mccoy MRN: [id number] Epilepsy Attending: Lora Havens Referring Physician/Provider: Dr Lynnae Sandhoff Date: 05/20/2021 Duration: 24.08 mins Patient history:  55 y.o. 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 Chart review revealed there may be a history of seizure and she was previously on LEV which she stopped on her own without reported seizures. She was then started on VPA for migraine and mood in setting of presumed seizure. EEG to evaluate for seizure Level of alertness: Awake AEDs during EEG study: None Technical aspects: This EEG study was done with scalp electrodes positioned according to the 10-20 International system of electrode placement. Electrical activity was acquired at a sampling rate of 500Hz  and  reviewed with a high frequency filter of 70Hz  and a low frequency filter of 1Hz . EEG data were recorded continuously and digitally stored. Description: The posterior dominant rhythm consists of 8 Hz activity of moderate voltage (25-35 uV) seen predominantly in posterior head regions, symmetric and reactive to eye opening and eye closing. EEG showed continuous polymorphic sharply contoured 3 to 6 Hz theta-delta slowing I left frontal region consistent with breach artifact.There is also intermittent generalized 3-6hz  theta-delta slowing.  Physiologic photic driving was not seen during photic stimulation.  Hyperventilation was not performed.   ABNORMALITY - Breach artifact, left frontal region - Intermittent slow, generalized IMPRESSION: This study is suggestive of cortical dysfunction arising from left frontal region consistent with prior craniotomy. There is also mild diffuse encephalopathy, nonspecific etiology. No seizures or definite epileptiform discharges were seen throughout the recording. Priyanka Barbra Sarks   US Abdomen Limited RUQ (LIVER/GB)  Result Date: 05/20/2021 CLINICAL DATA:  Initial evaluation for nausea, vomiting. EXAM: ULTRASOUND ABDOMEN LIMITED RIGHT UPPER QUADRANT COMPARISON:  CT from 10/20/2020. FINDINGS: Gallbladder: Small amount of layering sludge seen within the gallbladder lumen. No shadowing echogenic stones. Gallbladder wall measures within normal limits at 2.9 mm. No significant free pericholecystic fluid. No sonographic Murphy sign elicited on exam. Common bile duct: Diameter: 3.8 mm Liver: No focal lesion identified. Within normal limits in parenchymal echogenicity. Portal vein is patent on color Doppler imaging with normal direction of  blood flow towards the liver. Other: None. IMPRESSION: 1. Gallbladder sludge. No cholelithiasis or evidence for acute cholecystitis. 2. No biliary dilatation. 3. Normal sonographic appearance of the liver. Electronically Signed   By: Jeannine Boga  M.D.   On: 05/20/2021 02:03        Scheduled Meds:  calcium carbonate  1 tablet Oral TID WC   Continuous Infusions:  sodium chloride 75 mL/hr at 05/20/21 0330     LOS: 0 days    Time spent: 35 minutes     Daijon Wenke A Akaylah Lalley, MD [facility]   If 7PM-7AM, please contact night-coverage [email or web address]  05/20/2021, 11:43 AM

## 2021-05-20 NOTE — Progress Notes (Signed)
EEG LTM setup at bedside. Called Dr. Hortense Ramal. No MRI leads used. Not monitored.

## 2021-05-20 NOTE — Progress Notes (Signed)
Moved pt LTM from ED to 3W22 without incident.

## 2021-05-20 NOTE — H&P (Signed)
History and Physical    Gaytha Raybourn MLY:[id number] DOB: 01/29/1966 DOA: 05/19/2021  PCP: Haydee Salter, MD  Patient coming from: Home.  History obtained from patient's sister.  Patient appears confused.  Chief Complaint: Confusion.  HPI: Deshanda Molitor is a 55 y.o. female with history of metastatic breast cancer being followed by Dr. Lindi Adie, oncologist was brought to the ER by patient's sister after the patient was found to be confused since waking up yesterday morning.  Patient was unable to keep in her medications and was throwing up.  Patient's sister states that patient has been having recent headache but last 3 days it has increasingly worsened.  ED Course: In the ER patient appears confused but following commands was afebrile with labs largely unremarkable with normal ammonia levels.  CT head was showing lesion in the pontine area which appears new when compared to the MRI done in February 2022 as per the radiologist.  UA is unremarkable chest x-ray unremarkable.  COVID test is pending.  Patient admitted for further work-up.  Review of Systems: As per HPI, rest all negative.   Past Medical History:  Diagnosis Date   Anxiety    Breast cancer metastasized to brain [facility])    Left breast   Chronic pain after cancer treatment    Depression    GERD (gastroesophageal reflux disease)    History of cancer chemotherapy    Breast cancer left breast   Seizure [facility])     Past Surgical History:  Procedure Laterality Date   BRAIN SURGERY     brain tumor   BREAST SURGERY       reports that she has never smoked. She has never used smokeless tobacco. She reports current alcohol use. She reports that she does not use drugs.  Allergies  Allergen Reactions   Dilaudid [Hydromorphone Hcl] Other (See Comments)    Confusion & hallucinations   Morphine And Related Other (See Comments)    HA   Penicillins    Tramadol     History reviewed. No pertinent family history.  Prior to  Admission medications   Medication Sig Start Date End Date Taking? Authorizing Provider  acetaminophen (TYLENOL) 500 MG tablet Take 1,000 mg by mouth every 6 (six) hours as needed.    [provider]  divalproex (DEPAKOTE) 500 MG DR tablet Take 1 tablet (500 mg total) by mouth 2 (two) times daily. 12/19/20   Ventura Sellers, MD  DULoxetine (CYMBALTA) 60 MG capsule Take 1 capsule (60 mg total) by mouth daily. 03/08/21   Cirigliano, Garvin Fila, DO  gabapentin (NEURONTIN) 300 MG capsule Take 300 mg by mouth 3 (three) times daily.    [provider]  mirabegron ER (MYRBETRIQ) 50 MG TB24 tablet Take 1 tablet (50 mg total) by mouth daily. 02/03/21   Cirigliano, Garvin Fila, DO  traMADol (ULTRAM) 50 MG tablet Take 2 tablets (100 mg total) by mouth 2 (two) times daily as needed. 05/08/21   Nicholas Lose, MD    Physical Exam: Constitutional: Moderately built and nourished. Vitals:   05/19/21 2200 05/19/21 2215 05/19/21 2230 05/19/21 2315  BP: (!) 149/104 (!) 168/106 (!) 130/118 (!) 147/102  Pulse: 67 (!) 51 (!) 55 (!) 58  Resp: 16 14 13  (!) 23  Temp:      TempSrc:      SpO2: (!) 89% 98% 100% 100%  Weight:      Height:       Eyes: Anicteric no pallor. ENMT: No discharge  from the ears eyes nose and mouth. Neck: No mass felt.  No neck rigidity. Respiratory: No rhonchi or crepitations. Cardiovascular: S1-S2 heard. Abdomen: Soft nontender bowel sound present. Musculoskeletal: No edema. Skin: No rash. Neurologic: Alert awake but confused moving all extremities following commands.  Pupils equal reacting to light. Psychiatric: Appears confused.   Labs on Admission: I have personally reviewed following labs and imaging studies  CBC: Recent Labs  Lab 05/19/21 1915 05/19/21 2053  WBC 4.2  --   NEUTROABS 2.6  --   HGB 13.0 13.3  HCT 40.1 39.0  MCV 95.2  --   PLT 171  --    Basic Metabolic Panel: Recent Labs  Lab 05/19/21 1915 05/19/21 2053  NA 138 139  K 3.9 3.7  CL 101  --    CO2 27  --   GLUCOSE 102*  --   BUN 8  --   CREATININE 0.89  --   CALCIUM 9.2  --    GFR: Estimated Creatinine Clearance: 98.6 mL/min (by C-G formula based on SCr of 0.89 mg/dL). Liver Function Tests: Recent Labs  Lab 05/19/21 1915  AST 16  ALT 11  ALKPHOS 60  BILITOT 0.7  PROT 7.3  ALBUMIN 3.5   No results for input(s): LIPASE, AMYLASE in the last 168 hours. Recent Labs  Lab 05/19/21 1916  AMMONIA 19   Coagulation Profile: No results for input(s): INR, PROTIME in the last 168 hours. Cardiac Enzymes: No results for input(s): CKTOTAL, CKMB, CKMBINDEX, TROPONINI in the last 168 hours. BNP (last 3 results) No results for input(s): PROBNP in the last 8760 hours. HbA1C: No results for input(s): HGBA1C in the last 72 hours. CBG: Recent Labs  Lab 05/19/21 2205  GLUCAP 87   Lipid Profile: No results for input(s): CHOL, HDL, LDLCALC, TRIG, CHOLHDL, LDLDIRECT in the last 72 hours. Thyroid Function Tests: No results for input(s): TSH, T4TOTAL, FREET4, T3FREE, THYROIDAB in the last 72 hours. Anemia Panel: No results for input(s): VITAMINB12, FOLATE, FERRITIN, TIBC, IRON, RETICCTPCT in the last 72 hours. Urine analysis:    Component Value Date/Time   COLORURINE YELLOW 05/19/2021 2237   APPEARANCEUR CLEAR 05/19/2021 2237   LABSPEC 1.014 05/19/2021 2237   PHURINE 9.0 (H) 05/19/2021 2237   GLUCOSEU NEGATIVE 05/19/2021 2237   HGBUR NEGATIVE 05/19/2021 2237   BILIRUBINUR NEGATIVE 05/19/2021 2237   KETONESUR NEGATIVE 05/19/2021 2237   PROTEINUR NEGATIVE 05/19/2021 2237   NITRITE NEGATIVE 05/19/2021 2237   LEUKOCYTESUR NEGATIVE 05/19/2021 2237   Sepsis Labs: @LABRCNTIP (procalcitonin:4,lacticidven:4) )No results found for this or any previous visit (from the past 240 hour(s)).   Radiological Exams on Admission: CT HEAD WO CONTRAST (5MM)  Result Date: 05/19/2021 CLINICAL DATA:  Headache, intracranial hemorrhage suspected. EXAM: CT HEAD WITHOUT CONTRAST TECHNIQUE:  Contiguous axial images were obtained from the base of the skull through the vertex without intravenous contrast. COMPARISON:  Brain MRI 10/20/2020. FINDINGS: Brain: Mild generalized cerebral and cerebellar atrophy. Redemonstrated chronic encephalomalacia/gliosis within the left frontal lobe, deep to left frontal and temporoparietal cranioplasties. Background moderate/advanced patchy and ill-defined hypoattenuation within the cerebral white matter, nonspecific but likely reflecting a combination of therapy related changes and chronic small vessel ischemic disease. There are irregular foci of hyperdensity within the pons, at least partly (and likely entirely) reflecting calcification (likely related to the patient's history of whole-brain radiation. However, these foci are new as compared to the brain MRI of 10/20/2020, and a short interval follow-up noncontrast head CT is recommended to ensure stability and  to exclude any acute hemorrhage at this site. No acute demarcated cortical infarct. No extra-axial fluid collection. No evidence of an intracranial mass. No midline shift. Vascular: No hyperdense vessel.  Atherosclerotic calcifications. Skull: Redemonstrated left frontal and left temporoparietal cranioplasties. Sinuses/Orbits: Visualized orbits show no acute finding. Trace mucosal thickening within the right sphenoid sinus Other: Bilateral mastoid effusions. Findings and recommendations discussed with Dr. Darl Householder of the emergency department by telephone at 8:55 p.m. on 05/19/2021. IMPRESSION: There are irregular foci of hyperdensity within the pons, at least partly (and likely entirely) reflecting calcification (a history of prior whole-brain radiation is noted). However, given the patient's symptoms and given that these foci are new as compared to the brain MRI of 10/20/2020, a short-interval follow-up noncontrast head CT is recommended to ensure stability and to exclude any acute hemorrhage at this site.  Redemonstrated chronic encephalomalacia and gliosis within the left frontal lobe, deep to left-sided cranioplasties. Background moderate/advanced cerebral white matter disease, likely reflecting a combination of treatment-related changes and chronic small vessel ischemia. Mild generalized parenchymal atrophy. Minimal right sphenoid sinus mucosal thickening. Bilateral mastoid effusions. Electronically Signed   By: Kellie Simmering D.O.   On: 05/19/2021 21:09   DG Chest Port 1 View  Result Date: 05/19/2021 CLINICAL DATA:  Altered mental status EXAM: PORTABLE CHEST 1 VIEW COMPARISON:  10/24/2020 FINDINGS: Left-sided central venous port tip over the SVC. No focal opacity or pleural effusion. Borderline cardiomegaly. Low lung volume. No pneumothorax IMPRESSION: No active disease. Hypoventilatory change with borderline cardiomegaly Electronically Signed   By: Donavan Foil M.D.   On: 05/19/2021 19:50    EKG: Independently reviewed.  Normal sinus rhythm heart rate around 57 bpm.  Assessment/Plan Active Problems:   Cancer of right breast metastatic to brain [facility])   Acute encephalopathy    Acute encephalopathy -cause not clear.  Patient is alert awake following commands but not oriented to her name place or time.  We will get a Depakote level.  I did discuss with on-call neurologist Dr. Theda Sers who advised getting MRI brain with and without contrast.  Neurology be seeing patient in consult.  Since patient cannot eat reliably we will keep patient n.p.o. for now. Nausea vomiting -Per patient's sister patient threw up 3 times today.  But I reviewed patient's recent notes from oncologist it is mentioned that patient has cholecystitis and is being referred to the surgeon.  LFTs are normal EF.  We will get a sonogram of the abdomen.  We will keep patient n.p.o. for now. History of chronic headaches used to take Cymbalta Depakote and gabapentin.  Checking Depakote levels.  Presently NPO. History of breast cancer  metastatic to brain with prior history of brain surgery in 2015 followed by radiation.  Presently being followed by oncologist.   DVT prophylaxis: SCDs.  Avoiding anticoagulation if in case patient needs further procedures including lumbar puncture. Code Status: Full code. Family Communication: Patient's sister. Disposition Plan: Home. Consults called: Neurology. Admission status: Observation.   Rise Patience MD [facility] Pager [phone].  If 7PM-7AM, please contact night-coverage [email or web address] Password [facility]  05/20/2021, 12:23 AM

## 2021-05-20 NOTE — Consult Note (Signed)
Neurology consult H&P  Monica Mccoy MR# [id number] 05/20/2021   CC: altered mental status  History is obtained from: patient and chart  HPI: Monica Mccoy is a 55 y.o. female PMHx migraines, breast cancer with metastases to brain s/p left frontal craniotomy and whole brain radiation 2015, seizure (?) with resultant baseline aphasia and decreased executive function with altered mental status.  The following was taken from ED note 05/19/21  1902: "...sister states that the patient was increasingly confused today, not recognizing family members, very forgetful. She was very sleepy, which is unusual for her. She then had several episodes of nausea and vomiting. She has had decreased appetite. She wouldn't respond appropriately to even basic questions. Last normal this morning at 10AM. She fells backwards onto her bottom on Tuesday.    Of note, she had brain surgery approximately 8 years ago for metastatic breast cancer."   ED Course: In the ER patient appears confused but following commands was afebrile with labs largely unremarkable with normal ammonia levels.  CT head was showing lesion in the pontine area which appears new when compared to the MRI done in February 2022 as per the radiologist.  UA is unremarkable chest x-ray unremarkable.  COVID test is pending.  Patient admitted for further work-up.  ROS: A complete ROS was performed and is negative except as noted in the HPI.   Past Medical History:  Diagnosis Date   Anxiety    Breast cancer metastasized to brain [facility])    Left breast   Chronic pain after cancer treatment    Depression    GERD (gastroesophageal reflux disease)    History of cancer chemotherapy    Breast cancer left breast   Seizure ([place])     History reviewed. No pertinent family history.  Social History:  reports that she has never smoked. She has never used smokeless tobacco. She reports current alcohol use. She reports that she does not use drugs.   Prior to  Admission medications   Medication Sig Start Date End Date Taking? Authorizing Provider  acetaminophen (TYLENOL) 500 MG tablet Take 1,000 mg by mouth every 6 (six) hours as needed.    [provider]  divalproex (DEPAKOTE) 500 MG DR tablet Take 1 tablet (500 mg total) by mouth 2 (two) times daily. 12/19/20   Ventura Sellers, MD  DULoxetine (CYMBALTA) 60 MG capsule Take 1 capsule (60 mg total) by mouth daily. 03/08/21   Cirigliano, Garvin Fila, DO  gabapentin (NEURONTIN) 300 MG capsule Take 300 mg by mouth 3 (three) times daily.    [provider]  mirabegron ER (MYRBETRIQ) 50 MG TB24 tablet Take 1 tablet (50 mg total) by mouth daily. 02/03/21   Cirigliano, Garvin Fila, DO  traMADol (ULTRAM) 50 MG tablet Take 2 tablets (100 mg total) by mouth 2 (two) times daily as needed. 05/08/21   Nicholas Lose, MD    Exam: Current vital signs: BP (!) 147/102   Pulse (!) 58   Temp 97.6 F (36.4 C) (Oral)   Resp (!) 23   Ht 5\' 5"  (1.651 m)   Wt 130.6 kg   SpO2 100%   BMI 47.93 kg/m   Physical Exam  Constitutional: Appears well-developed and well-nourished.  Psych: Affect appropriate to situation Eyes: No scleral injection HENT: No OP obstruction. Head: Normocephalic.  Cardiovascular: Normal rate and regular rhythm.  Respiratory: Effort normal, symmetric excursions bilaterally, no audible wheezing. GI: Soft.  No distension. There is no tenderness.  Skin: WDI  Neuro:  Mental Status: Patient is awake, alert, not oriented to person, place time. Patient is not able to give a clear and coherent history. Speech showed impaired fluency, paraphasic errors with mildly impaired comprehension and repetition. No signs of neglect. Visual Fields are full. Pupils are equal, round, and reactive to light. EOMI without ptosis or diploplia.  Facial sensation is symmetric to temperature Facial movement is symmetric.  Hearing is intact to voice. Uvula midline and palate elevates symmetrically. Shoulder  shrug is symmetric. Tongue is midline without atrophy or fasciculations.  Tone is normal. Bulk is normal. ~5/5 strength was present in all four extremities. Sensation is symmetric cold temperature in the arms and legs. Deep Tendon Reflexes: 2+ and symmetric in the biceps and patellae. Toes are downgoing bilaterally. FNF and HKS are intact bilaterally. Gait - Deferred  I have reviewed labs in epic and the pertinent results are:  Ref. Range 05/19/2021 20:53  Calcium Ionized Latest Ref Range: 1.15 - 1.40 mmol/L 0.99 (L)    I have reviewed the images obtained: MRI brain showed  no acute intracranial process, left frontal craniotomy with underlying encephalomalacia/gliosis, atrophy with advanced cerebral white matter changes elsewhere within the brain, likely reflecting post treatment changes. Few scattered associated chronic microhemorrhages and/or calcifications, a few of which are new as compared to previous MRI from 10/20/2020.  Assessment: Monica Mccoy is a 55 y.o. 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 Chart review revealed there may be a history of seizure and she was previously on LEV which she stopped on her own without reported seizures. She was then started on VPA for migraine and mood in setting of presumed seizure. Her baseline is not entirely clear but she is severely hypocalcemic and will need EEG.   Impression:  Acute encephalopathy will need to rule out seizure. Hypocalcemia. History of breast cancer with metastasis to brain. S/p left frontal craniotomy    Plan: - Stat EEG to evaluate for seizure/epileptogenic discharges. - Correct metabolic abnormalities especially hypocalcemia. - If indicated metabolic/infectious workup. - Neurology will continue to  follow.   Electronically signed by:  Lynnae Sandhoff, MD Page: [id number] 05/20/2021, 12:45 AM

## 2021-05-21 DIAGNOSIS — G9340 Encephalopathy, unspecified: Secondary | ICD-10-CM | POA: Diagnosis not present

## 2021-05-21 DIAGNOSIS — C7931 Secondary malignant neoplasm of brain: Secondary | ICD-10-CM

## 2021-05-21 DIAGNOSIS — C50911 Malignant neoplasm of unspecified site of right female breast: Secondary | ICD-10-CM

## 2021-05-21 DIAGNOSIS — G43009 Migraine without aura, not intractable, without status migrainosus: Secondary | ICD-10-CM | POA: Diagnosis not present

## 2021-05-21 LAB — BASIC METABOLIC PANEL
Anion gap: 6 (ref 5–15)
BUN: 8 mg/dL (ref 6–20)
CO2: 28 mmol/L (ref 22–32)
Calcium: 8.5 mg/dL — ABNORMAL LOW (ref 8.9–10.3)
Chloride: 103 mmol/L (ref 98–111)
Creatinine, Ser: 0.84 mg/dL (ref 0.44–1.00)
GFR, Estimated: >60 mL/min (ref >60)
Glucose, Bld: 95 mg/dL (ref 70–99)
Potassium: 3.6 mmol/L (ref 3.5–5.1)
Sodium: 137 mmol/L (ref 135–145)

## 2021-05-21 LAB — CBC
HCT: 36.4 % (ref 36.0–46.0)
Hemoglobin: 11.9 g/dL — ABNORMAL LOW (ref 12.0–15.0)
MCH: 30.9 pg (ref 26.0–34.0)
MCHC: 32.7 g/dL (ref 30.0–36.0)
MCV: 94.5 fL (ref 80.0–100.0)
Platelets: 157 10*3/uL (ref 150–400)
RBC: 3.85 MIL/uL — ABNORMAL LOW (ref 3.87–5.11)
RDW: 13.2 % (ref 11.5–15.5)
WBC: 3.8 10*3/uL — ABNORMAL LOW (ref 4.0–10.5)
nRBC: 0 % (ref 0.0–0.2)

## 2021-05-21 LAB — GLUCOSE, CAPILLARY
Glucose-Capillary: 100 mg/dL — ABNORMAL HIGH (ref 70–99)
Glucose-Capillary: 104 mg/dL — ABNORMAL HIGH (ref 70–99)
Glucose-Capillary: 91 mg/dL (ref 70–99)

## 2021-05-21 MED ORDER — DIVALPROEX SODIUM 500 MG PO DR TAB: 500.0000 mg | DELAYED_RELEASE_TABLET | Freq: Two times a day (BID) | ORAL | 3 refills | Status: DC

## 2021-05-21 MED ORDER — AMLODIPINE BESYLATE 5 MG PO TABS
5.0000 mg | ORAL_TABLET | Freq: Every day | ORAL | Status: DC
  Administered 2021-05-21: 5 mg via ORAL
  Filled 2021-05-21: qty 1

## 2021-05-21 MED ORDER — GABAPENTIN 300 MG PO CAPS: 300.0000 mg | ORAL_CAPSULE | Freq: Three times a day (TID) | ORAL | 3 refills | Status: DC

## 2021-05-21 MED ORDER — DULOXETINE HCL 60 MG PO CPEP: 60.0000 mg | ORAL_CAPSULE | Freq: Every day | ORAL | 1 refills | Status: DC

## 2021-05-21 MED ORDER — SODIUM CHLORIDE 0.9% FLUSH
10.0000 mL | Freq: Two times a day (BID) | INTRAVENOUS | Status: DC
  Administered 2021-05-21: 10 mL

## 2021-05-21 MED ORDER — CALCIUM CARBONATE 1250 (500 CA) MG PO TABS: 1.0000 | ORAL_TABLET | Freq: Three times a day (TID) | ORAL | 0 refills | Status: DC

## 2021-05-21 MED ORDER — MIRABEGRON ER 50 MG PO TB24: 50.0000 mg | ORAL_TABLET | Freq: Every day | ORAL | 1 refills | Status: DC

## 2021-05-21 MED ORDER — SODIUM CHLORIDE 0.9% FLUSH: 10.0000 mL | INTRAVENOUS | Status: DC | PRN

## 2021-05-21 MED ORDER — AMLODIPINE BESYLATE 5 MG PO TABS: 5.0000 mg | ORAL_TABLET | Freq: Every day | ORAL | 3 refills | Status: DC

## 2021-05-21 NOTE — Plan of Care (Signed)
  Problem: Education: Goal: Knowledge of General Education information will improve Description: Including pain rating scale, medication(s)/side effects and non-pharmacologic comfort measures Outcome: Adequate for Discharge   Problem: Health Behavior/Discharge Planning: Goal: Ability to manage health-related needs will improve Outcome: Adequate for Discharge   Problem: Clinical Measurements: Goal: Ability to maintain clinical measurements within normal limits will improve Outcome: Adequate for Discharge Goal: Will remain free from infection Outcome: Adequate for Discharge Goal: Diagnostic test results will improve Outcome: Adequate for Discharge Goal: Respiratory complications will improve Outcome: Adequate for Discharge Goal: Cardiovascular complication will be avoided Outcome: Adequate for Discharge   Problem: Activity: Goal: Risk for activity intolerance will decrease Outcome: Adequate for Discharge   Problem: Nutrition: Goal: Adequate nutrition will be maintained Outcome: Adequate for Discharge   Problem: Coping: Goal: Level of anxiety will decrease Outcome: Adequate for Discharge   Problem: Safety: Goal: Ability to remain free from injury will improve Outcome: Adequate for Discharge   Problem: Skin Integrity: Goal: Risk for impaired skin integrity will decrease Outcome: Adequate for Discharge   Problem: Health Behavior/Discharge Planning: Goal: Compliance with prescribed medication regimen will improve Outcome: Adequate for Discharge   Problem: Medication: Goal: Risk for medication side effects will decrease Outcome: Adequate for Discharge   Problem: Safety: Goal: Verbalization of understanding the information provided will improve Outcome: Adequate for Discharge   Problem: Self-Concept: Goal: Level of anxiety will decrease Outcome: Adequate for Discharge Goal: Ability to verbalize feelings about condition will improve Outcome: Adequate for Discharge

## 2021-05-21 NOTE — Evaluation (Signed)
Physical Therapy Evaluation Patient Details Name: Monica Mccoy MRN: [id number] DOB: 1966-08-24 Today's Date: 05/21/2021  History of Present Illness  Patient is a 55 y/o female who presents on 05/20/21 with HA, confusion and vomiting. Head CT- new lesion in the pontine area from previous MRI. Admitted with Acute encephalopathy, unknown cause and severely hypocalcemia. PMH includes breast ca with mets to the brain s/p left frontal craniotomy and whole brain radiation 2015 with resultant baseline aphasia and decreased executive function, anxiety, depression, seizures.  Clinical Impression  Patient presents with impaired balance, mild LLE weakness/impaired sensation, RUE weakness/impaired sensation, cognitive deficits and impaired mobility s/p above. Pt lives at home with family- son and sisters who assist with IADls, cooking, meds, finances etc. Pt able to perform own ADLs but reports frequent falls. Has to negotiate stairs to get to bedroom and into home. Has been working with HHPT per report. Unsure of cognitive baseline- noted to have slower processing, easily distracted, oriented to self and place only and poor awareness/attention. Tolerated gait training with close Min guard for safety due to above and balance deficits which pt reports as close to baseline. Will follow acutely to maximize independence and mobility prior to return home. Recommend continuing HHPT to address falls, balance and overall safety in home environment.       Recommendations for follow up therapy are one component of a multi-disciplinary discharge planning process, led by the attending physician.  Recommendations may be updated based on patient status, additional functional criteria and insurance authorization.  Follow Up Recommendations Home health PT;Supervision - Intermittent (continue HHPT)    Equipment Recommendations  None recommended by PT    Recommendations for Other Services       Precautions / Restrictions  Precautions Precautions: Fall Precaution Comments: hx of falls Restrictions Weight Bearing Restrictions: No      Mobility  Bed Mobility Overal bed mobility: Modified Independent             General bed mobility comments: No assist needed.    Transfers Overall transfer level: Modified independent               General transfer comment: Stood from [employer], from toilet x1, no assist needed.  Ambulation/Gait Ambulation/Gait assistance: Min guard Gait Distance (Feet): 80 Feet Assistive device: None (rail in hallway) Gait Pattern/deviations: Step-through pattern;Decreased stride length;Wide base of support Gait velocity: decreased   General Gait Details: SLow, mildly unsteady gait reaching for rail for support; decreased foot clearance on right at times almost tripping. Impaired balance  Stairs            Wheelchair Mobility    Modified Rankin (Stroke Patients Only)       Balance Overall balance assessment: Needs assistance;History of Falls Sitting-balance support: Feet supported;No upper extremity supported Sitting balance-Leahy Scale: Normal     Standing balance support: During functional activity Standing balance-Leahy Scale: Fair                               Pertinent Vitals/Pain Pain Assessment: Faces Faces Pain Scale: Hurts even more Pain Location: stomach Pain Descriptors / Indicators: Aching;Discomfort Pain Intervention(s): Monitored during session    Home Living Family/patient expects to be discharged to:: Private residence Living Arrangements:  (2 sisters and 68 y/o sons) Available Help at Discharge: Family;Available 24 hours/day Type of Home: House Home Access: Stairs to enter Entrance Stairs-Rails: Right Entrance Stairs-Number of Steps: 1 flight Home Layout: Two  level;Bed/bath upstairs Home Equipment: Bedside commode      Prior Function Level of Independence: Independent         Comments: Does own ADLs;  reports multiple falls at home. Does not drive. Does not cook. SIster manages meds and finances.     Hand Dominance   Dominant Hand: Right    Extremity/Trunk Assessment   Upper Extremity Assessment Upper Extremity Assessment: RUE deficits/detail RUE Sensation: decreased light touch RUE Coordination: decreased fine motor;decreased gross motor    Lower Extremity Assessment Lower Extremity Assessment: LLE deficits/detail LLE Deficits / Details: Grossly~4/5 throughout LLE Sensation: decreased light touch LLE Coordination: decreased fine motor;decreased gross motor    Cervical / Trunk Assessment Cervical / Trunk Assessment: Normal  Communication   Communication: Expressive difficulties (word finding difficulties)  Cognition Arousal/Alertness: Awake/alert Behavior During Therapy: WFL for tasks assessed/performed Overall Cognitive Status: Impaired/Different from baseline Area of Impairment: Problem solving;Memory;Orientation;Attention;Following commands;Safety/judgement;Awareness                 Orientation Level: Disoriented to;Situation;Time Current Attention Level: Selective;Sustained Memory: Decreased short-term memory Following Commands: Follows multi-step commands inconsistently Safety/Judgement: Decreased awareness of safety;Decreased awareness of deficits Awareness: Intellectual Problem Solving: Slow processing;Requires verbal cues;Requires tactile cues General Comments: Loses train of thought during conversation; difficult historian. Word finding difficulties,trouble focusing. Distracted.      General Comments General comments (skin integrity, edema, etc.): Able to donn socks sitting in bed with some difficulty and increased time.    Exercises     Assessment/Plan    PT Assessment Patient needs continued PT services  PT Problem List Decreased mobility;Decreased safety awareness;Decreased cognition;Decreased balance       PT Treatment Interventions  Therapeutic exercise;Gait training;Stair training;Patient/family education;Therapeutic activities;Functional mobility training;Balance training    PT Goals (Current goals can be found in the Care Plan section)  Acute Rehab PT Goals Patient Stated Goal: to go home PT Goal Formulation: With patient Time For Goal Achievement: 06/04/21 Potential to Achieve Goals: Good    Frequency Min 3X/week   Barriers to discharge Inaccessible home environment stairs    Co-evaluation               AM-PAC PT "6 Clicks" Mobility  Outcome Measure Help needed turning from your back to your side while in a flat bed without using bedrails?: None Help needed moving from lying on your back to sitting on the side of a flat bed without using bedrails?: A Little Help needed moving to and from a bed to a chair (including a wheelchair)?: A Little Help needed standing up from a chair using your arms (e.g., wheelchair or bedside chair)?: A Little Help needed to walk in hospital room?: A Little Help needed climbing 3-5 steps with a railing? : A Little 6 Click Score: 19    End of Session Equipment Utilized During Treatment: Gait belt Activity Tolerance: Patient tolerated treatment well Patient left: in bed;with call bell/phone within reach;with bed alarm set Nurse Communication: Mobility status PT Visit Diagnosis: Muscle weakness (generalized) (M62.81);Unsteadiness on feet (R26.81)    Time: 4503-8882 PT Time Calculation (min) (ACUTE ONLY): 22 min   Charges:   PT Evaluation $PT Eval Moderate Complexity: 1 Mod          Marisa Severin, PT, DPT Acute Rehabilitation Services Pager [phone] Office [phone]     Marguarite Arbour A [name] Heck 05/21/2021, 1:10 PM

## 2021-05-21 NOTE — Progress Notes (Signed)
EEG LTM stopped and no breakdown seen. Results pending.

## 2021-05-21 NOTE — Progress Notes (Signed)
Neurology Progress Note  Brief HPI: 55 y.o. female PMHx migraines, breast cancer with metastases to brain s/p left frontal craniotomy and whole brain radiation 2015, seizure (?) with resultant baseline aphasia and decreased executive function with AMS.  Patient was brought in by family on 9/23 for evaluation of increased confusion and forgetfulness with lethargy followed by multiple episodes of nausea and vomiting.   Subjective: No acute overnight events  Exam: Vitals:   05/21/21 0338 05/21/21 0736  BP: (!) 151/106 (!) 153/101  Pulse: (!) 56 (!) 56  Resp: 17 20  Temp: 97.8 F (36.6 C) 97.7 F (36.5 C)  SpO2: 95% 99%   Gen: Sitting up comfortably in bed, watching television, in no acute distress Resp: non-labored breathing, no respiratory distress on room air Abd: soft, nontender, nondistended  Neuro: Mental Status: Awake, alert to self, in good spirits joking with staff this morning.  She is not alert to place or situation but with some guiding with context clues she states that she is in the hospital.  She states the year is "1980 something" and that her age is "61 something". She recalls meeting neurologist and NP yesterday in the ED. She is not able to give a clear or coherent history of present illness. Naming is mostly intact and she is able to name 4/5 objects however when asked to name "badge" she repeatedly provides an unintelligible answer. Speech is without dysarthria but with impaired fluency. No neglect is noted Cranial Nerves: PERRL, EOMI without ptosis or diplopia, facial sensation is intact and symmetric to light touch, face is symmetric resting and smiling, hearing is intact to voice, palate elevates symmetrically, shoulder shrug is symmetric, phonation is normal, tongue protrudes midline. Motor: 5/5 strength present in bilateral lower extremities, 5/5 strength with grips and biceps bilateral upper extremities with 4-/5 strength triceps.  Tone and bulk were  normal. Sensory: Intact and symmetric to light touch in all extremities DTR: 2+ and symmetric in the biceps and patellae Plantars: Toes downgoing bilaterally  Cerebellar: FNF intact with bradykinetic movements without dysmetria. Gait: Deferred  Pertinent Labs: CBC    Component Value Date/Time   WBC 3.8 (L) 05/21/2021 0157   RBC 3.85 (L) 05/21/2021 0157   HGB 11.9 (L) 05/21/2021 0157   HGB 11.0 (L) 04/14/2021 1017   HCT 36.4 05/21/2021 0157   PLT 157 05/21/2021 0157   PLT 142 (L) 04/14/2021 1017   MCV 94.5 05/21/2021 0157   MCH 30.9 05/21/2021 0157   MCHC 32.7 05/21/2021 0157   RDW 13.2 05/21/2021 0157   LYMPHSABS 0.8 05/19/2021 1915   MONOABS 0.7 05/19/2021 1915   EOSABS 0.0 05/19/2021 1915   BASOSABS 0.0 05/19/2021 1915   CMP     Component Value Date/Time   NA 137 05/21/2021 0157   NA 139 12/15/2019 0000   K 3.6 05/21/2021 0157   CL 103 05/21/2021 0157   CO2 28 05/21/2021 0157   GLUCOSE 95 05/21/2021 0157   BUN 8 05/21/2021 0157   BUN 10 12/15/2019 0000   CREATININE 0.84 05/21/2021 0157   CREATININE 1.02 (H) 04/14/2021 1017   CALCIUM 8.5 (L) 05/21/2021 0157   PROT 7.3 05/19/2021 1915   ALBUMIN 3.5 05/19/2021 1915   AST 16 05/19/2021 1915   AST 13 (L) 04/14/2021 1017   ALT 11 05/19/2021 1915   ALT 7 04/14/2021 1017   ALKPHOS 60 05/19/2021 1915   BILITOT 0.7 05/19/2021 1915   BILITOT 0.5 04/14/2021 1017   GFRNONAA >60 05/21/2021 0157  GFRNONAA >60 04/14/2021 1017   Imaging Reviewed:  Overnight EEG 05/20/2021 - 05/21/2021: "This study is suggestive of cortical dysfunction arising from left frontal region consistent with prior craniotomy. There is also mild diffuse encephalopathy, nonspecific etiology. No seizures or definite epileptiform discharges were seen throughout the recording."  MRI brain wwo 9/24: 1. No acute intracranial abnormality. Parenchymal hyperdensity noted on prior CT most consistent with calcification/mineralization. 2. Postoperative changes  from prior left frontal craniotomy with underlying encephalomalacia/gliosis. 3. Atrophy with advanced cerebral white matter changes elsewhere within the brain, likely reflecting post treatment changes. Few scattered associated chronic microhemorrhages and/or calcifications, a few of which are new as compared to previous MRI from 10/20/2020.  [facility] 05/19/2021: - There are irregular foci of hyperdensity within the pons, at least partly (and likely entirely) reflecting calcification (a history of prior whole-brain radiation is noted). However, given the patient's symptoms and given that these foci are new as compared to the brain MRI of 10/20/2020, a short-interval follow-up noncontrast head CT is recommended to ensure stability and to exclude any acute hemorrhage at this site. - Redemonstrated chronic encephalomalacia and gliosis within the left frontal lobe, deep to left-sided cranioplasties. - Background moderate/advanced cerebral white matter disease, likely reflecting a combination of treatment-related changes and chronic small vessel ischemia. - Mild generalized parenchymal atrophy. - Minimal right sphenoid sinus mucosal thickening. - Bilateral mastoid effusions.  Assessment: Monica Mccoy is a 55 y.o. female PMHx migraines, breast cancer with metastases to brain s/p left frontal craniotomy and whole brain radiation 2015, seizure (?) with resultant baseline aphasia and decreased executive function with worsening encephalopathy which began the morning of 05/19/2021. Chart review revealed there may be a history of seizure and she was previously on LEV which she stopped on her own without reported seizures. She was then started on VPA for migraine and mood in setting of presumed seizure. She was found to be severely hypocalcemic and was placed on EEG. - Examination reveals patient that is oriented to self and to environment with some leading with some impairment in fluency along with paraphasic errors and  bradykinetic movements. Her assessment while inpatient is consistent with Dr. Renda Rolls last outpatient assessment in June of 2022 and patient appears to be at her baseline mental and functional status.  - EEG suggests cortical dysfunction from the left frontal region that is consistent with her prior craniotomy with mild diffuse encephalopathy but without seizures or definite epileptiform discharges.  - Presentation of encephalopathy may be related to metabolic derangements with severe hypocalcemia noted on presentation and improvement in mental status to baseline with repletion.   Impression:  Acute encephalopathy will need to rule out seizure. Hypocalcemia, oral repletion  History of breast cancer with metastasis to brain. S/p left frontal craniotomy   Recommendations: - Discontinue LTM monitoring - Continue Depakote nad gabapentin at home doses - Management of metabolic derangements as you are - No further neurology recommendations at this time.  -Follow-up with Dr. Mickeal Skinner as planned.  Anibal Henderson, AGACNP-BC [facility] [phone]  Attending Neurohospitalist Addendum Patient seen and examined with APP/Resident. Agree with the history and physical as documented above. Agree with the plan as documented, which I helped formulate. I have independently reviewed the chart, obtained history, review of systems and examined the patient.I have personally reviewed pertinent head/neck/spine imaging (CT/MRI). Please feel free to call with any questions.  -- Amie Portland, MD Neurologist [facility] Pager: [phone]

## 2021-05-21 NOTE — Discharge Summary (Signed)
Physician Discharge Summary  Yekaterina Escutia QMV:[id number] DOB: [birth year] DOA: 05/19/2021  PCP: Haydee Salter, MD  Admit date: 05/19/2021 Discharge date: 05/21/2021  Admitted From: Home  Disposition:  Home   Recommendations for Outpatient Follow-up:  Follow up with PCP in 1-2 weeks Please obtain BMP/CBC in one week Needs calcium level.  Needs to follow up with primary oncologist   Home Health: None  Discharge Condition: Stable.  CODE STATUS: Full Code Diet recommendation: Heart Healthy   Brief/Interim Summary: 55 year old with past medical history significant for metastatic breast cancer followed by Dr. Payton Mccallum, presented to the ED after patient was found to be confused since the morning of admission.  Patient was also vomiting.  Patient has been complaining of some headaches. Evaluation in the ED CT head has a new lesion in the pontine area which appears new when compared to MRI done on February 2022.  UA chest x-ray unremarkable.    1-Acute Encephalopathy Unclear etiology. Plan to replete calcium, orally wont do  IV due to bradycardia MRI:No acute intracranial abnormality. Parenchymal hyperdensity noted on prior CT most consistent with calcification/mineralization. Postoperative changes from prior left frontal craniotomy with underlying encephalomalacia/gliosis. Atrophy with advanced cerebral white matter changes elsewhere within the brain, likely reflecting post treatment changes. Few scattered associated chronic microhemorrhages and/or calcifications, a few of which are new as compared to previous MRI from 10/20/2020 -plan for continuous EEG. Negative.  -Will resume Depakote, gabapentin. Per sister she was on some medications for seizure.  -ok to discharge home  2-Nausea, vomiting: Resolved./  -Vomited the day of admission.  No further vomiting here in the hospital. -Recent oncologist no patient has some cholecystitis and was referred to surgery. -Liver function test  normal. -[place]; Gallbladder sludge. No cholelithiasis or evidence for acute cholecystitis. No biliary dilatation. Normal sonographic appearance of the liver. Monitor clinically. No abdominal pain.    3-Hypocalcemia:  replete orally 4-hypokalemia: Replaced.  5-History of Chronic Headaches; resume gabapentin.    6-History of breast cancer, metastasis to brain S/p surgery and radiation. Sx Will add Dr Lindi Adie to rounding team.      Estimated body mass index is 47.93 kg/m as calculated from the following:   Height as of this encounter: 5\' 5"  (1.651 m).   Weight as of this encounter: 130.6 kg.      Discharge Diagnoses:  Active Problems:   Cancer of right breast metastatic to brain [facility])   Acute encephalopathy    Discharge Instructions  Discharge Instructions     Diet - low sodium heart healthy   Complete by: As directed    Increase activity slowly   Complete by: As directed       Allergies as of 05/21/2021       Reactions   Dilaudid [hydromorphone Hcl] Other (See Comments)   Confusion & hallucinations   Morphine And Related Other (See Comments)   HA   Penicillins    Tramadol         Medication List     TAKE these medications    acetaminophen 500 MG tablet Commonly known as: TYLENOL Take 1,000 mg by mouth every 6 (six) hours as needed.   amLODipine 5 MG tablet Commonly known as: NORVASC Take 1 tablet (5 mg total) by mouth daily.   calcium carbonate 1250 (500 Ca) MG tablet Commonly known as: OS-CAL - dosed in mg of elemental calcium Take 1 tablet (500 mg of elemental calcium total) by mouth 3 (three) times daily with meals.  divalproex 500 MG DR tablet Commonly known as: Depakote Take 1 tablet (500 mg total) by mouth 2 (two) times daily.   DULoxetine 60 MG capsule Commonly known as: CYMBALTA Take 1 capsule (60 mg total) by mouth daily.   gabapentin 300 MG capsule Commonly known as: NEURONTIN Take 1 capsule (300 mg total) by mouth 3 (three) times  daily.   mirabegron ER 50 MG Tb24 tablet Commonly known as: Myrbetriq Take 1 tablet (50 mg total) by mouth daily.   traMADol 50 MG tablet Commonly known as: ULTRAM Take 2 tablets (100 mg total) by mouth 2 (two) times daily as needed.        Allergies  Allergen Reactions   Dilaudid [Hydromorphone Hcl] Other (See Comments)    Confusion & hallucinations   Morphine And Related Other (See Comments)    HA   Penicillins    Tramadol     Consultations: Neurology  Procedures/Studies: CT HEAD WO CONTRAST (5MM)  Result Date: 05/19/2021 CLINICAL DATA:  Headache, intracranial hemorrhage suspected. EXAM: CT HEAD WITHOUT CONTRAST TECHNIQUE: Contiguous axial images were obtained from the base of the skull through the vertex without intravenous contrast. COMPARISON:  Brain MRI 10/20/2020. FINDINGS: Brain: Mild generalized cerebral and cerebellar atrophy. Redemonstrated chronic encephalomalacia/gliosis within the left frontal lobe, deep to left frontal and temporoparietal cranioplasties. Background moderate/advanced patchy and ill-defined hypoattenuation within the cerebral white matter, nonspecific but likely reflecting a combination of therapy related changes and chronic small vessel ischemic disease. There are irregular foci of hyperdensity within the pons, at least partly (and likely entirely) reflecting calcification (likely related to the patient's history of whole-brain radiation. However, these foci are new as compared to the brain MRI of 10/20/2020, and a short interval follow-up noncontrast head CT is recommended to ensure stability and to exclude any acute hemorrhage at this site. No acute demarcated cortical infarct. No extra-axial fluid collection. No evidence of an intracranial mass. No midline shift. Vascular: No hyperdense vessel.  Atherosclerotic calcifications. Skull: Redemonstrated left frontal and left temporoparietal cranioplasties. Sinuses/Orbits: Visualized orbits show no acute  finding. Trace mucosal thickening within the right sphenoid sinus Other: Bilateral mastoid effusions. Findings and recommendations discussed with Dr. Darl Householder of the emergency department by telephone at 8:55 p.m. on 05/19/2021. IMPRESSION: There are irregular foci of hyperdensity within the pons, at least partly (and likely entirely) reflecting calcification (a history of prior whole-brain radiation is noted). However, given the patient's symptoms and given that these foci are new as compared to the brain MRI of 10/20/2020, a short-interval follow-up noncontrast head CT is recommended to ensure stability and to exclude any acute hemorrhage at this site. Redemonstrated chronic encephalomalacia and gliosis within the left frontal lobe, deep to left-sided cranioplasties. Background moderate/advanced cerebral white matter disease, likely reflecting a combination of treatment-related changes and chronic small vessel ischemia. Mild generalized parenchymal atrophy. Minimal right sphenoid sinus mucosal thickening. Bilateral mastoid effusions. Electronically Signed   By: Kellie Simmering D.O.   On: 05/19/2021 21:09   MR BRAIN W WO CONTRAST  Result Date: 05/20/2021 CLINICAL DATA:  Initial evaluation for acute confusion. History of metastatic breast cancer. EXAM: MRI HEAD WITHOUT AND WITH CONTRAST TECHNIQUE: Multiplanar, multiecho pulse sequences of the brain and surrounding structures were obtained without and with intravenous contrast. CONTRAST:  80mL GADAVIST GADOBUTROL 1 MMOL/ML IV SOLN COMPARISON:  CT from 05/19/2021 and MRI from 10/20/2020. FINDINGS: Brain: Examination degraded by motion artifact. No abnormal foci of restricted diffusion to suggest acute or subacute ischemia. No acute intracranial  hemorrhage, with specific noted no hemorrhage about the pons. Previously noted hyperdensity on prior CT consistent with calcification/mineralization. Few scattered chronic micro hemorrhages noted, most pronounced posteriorly, a few  of which are new as compared to prior MRI. Findings are presumably related to posttreatment changes. Diffuse prominence of the CSF containing spaces compatible with atrophy. Encephalomalacia and gliosis involving the left frontal region noted, stable from previous. Additional hazy and confluent T2/FLAIR hyperintensity involving the supratentorial cerebral white matter most consistent with post treatment changes, also similar. No mass lesion or abnormal enhancement. No mass effect or midline shift. Mild ventricular prominence with slight ex vacuo dilatation of the left lateral ventricle without hydrocephalus, stable. No extra-axial fluid collection. Vascular: Major intracranial vascular flow voids are maintained. Skull and upper cervical spine: Craniocervical junction within normal limits. Bone marrow signal intensity normal. No focal marrow replacing lesion. Postoperative changes from prior left craniotomy noted. No acute scalp soft tissue abnormality. Sinuses/Orbits: Globes and orbital soft tissues demonstrate no acute finding. Patient status post ocular lens replacement on the left. Paranasal sinuses are largely clear. Small to moderate bilateral mastoid effusions, similar to previous, and likely chronic. Other: None. IMPRESSION: 1. No acute intracranial abnormality. Parenchymal hyperdensity noted on prior CT most consistent with calcification/mineralization. 2. Postoperative changes from prior left frontal craniotomy with underlying encephalomalacia/gliosis. 3. Atrophy with advanced cerebral white matter changes elsewhere within the brain, likely reflecting post treatment changes. Few scattered associated chronic microhemorrhages and/or calcifications, a few of which are new as compared to previous MRI from 10/20/2020. Electronically Signed   By: Jeannine Boga M.D.   On: 05/20/2021 03:30   DG Chest Port 1 View  Result Date: 05/19/2021 CLINICAL DATA:  Altered mental status EXAM: PORTABLE CHEST 1 VIEW  COMPARISON:  10/24/2020 FINDINGS: Left-sided central venous port tip over the SVC. No focal opacity or pleural effusion. Borderline cardiomegaly. Low lung volume. No pneumothorax IMPRESSION: No active disease. Hypoventilatory change with borderline cardiomegaly Electronically Signed   By: Donavan Foil M.D.   On: 05/19/2021 19:50   EEG adult  Result Date: 05/20/2021 Lora Havens, MD     05/20/2021  9:18 AM Patient Name: Burgandy Hackworth MRN: [id number] Epilepsy Attending: Lora Havens Referring Physician/Provider: Dr Lynnae Sandhoff Date: 05/20/2021 Duration: 24.08 mins Patient history:  55 y.o. 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 Chart review revealed there may be a history of seizure and she was previously on LEV which she stopped on her own without reported seizures. She was then started on VPA for migraine and mood in setting of presumed seizure. EEG to evaluate for seizure Level of alertness: Awake AEDs during EEG study: None Technical aspects: This EEG study was done with scalp electrodes positioned according to the 10-20 International system of electrode placement. Electrical activity was acquired at a sampling rate of 500Hz  and reviewed with a high frequency filter of 70Hz  and a low frequency filter of 1Hz . EEG data were recorded continuously and digitally stored. Description: The posterior dominant rhythm consists of 8 Hz activity of moderate voltage (25-35 uV) seen predominantly in posterior head regions, symmetric and reactive to eye opening and eye closing. EEG showed continuous polymorphic sharply contoured 3 to 6 Hz theta-delta slowing I left frontal region consistent with breach artifact.There is also intermittent generalized 3-6hz  theta-delta slowing.  Physiologic photic  driving was  not seen during photic stimulation.  Hyperventilation was not performed.   ABNORMALITY - Breach artifact, left frontal region - Intermittent slow, generalized IMPRESSION: This study is suggestive of cortical dysfunction arising from left frontal region consistent with prior craniotomy. There is also mild diffuse encephalopathy, nonspecific etiology. No seizures or definite epileptiform discharges were seen throughout the recording. Priyanka Barbra Sarks   Overnight EEG with video  Result Date: 05/21/2021 Lora Havens, MD     05/21/2021 10:00 AM Patient Name: Kynlea Blackston MRN: [id number] Epilepsy Attending: Lora Havens Referring Physician/Provider: Dr Lynnae Sandhoff Duration: 05/20/2021 9381 to 05/21/2021 0855  Patient history:  55 y.o. 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 Chart review revealed there may be a history of seizure and she was previously on LEV which she stopped on her own without reported seizures. She was then started on VPA for migraine and mood in setting of presumed seizure. EEG to evaluate for seizure  Level of alertness: Awake, asleep  AEDs during EEG study: None  Technical aspects: This EEG study was done with scalp electrodes positioned according to the 10-20 International system of electrode placement. Electrical activity was acquired at a sampling rate of 500Hz  and reviewed with a high frequency filter of 70Hz  and a low frequency filter of 1Hz . EEG data were recorded continuously and digitally stored.  Description: The posterior dominant rhythm consists of 8 Hz activity of moderate voltage (25-35 uV) seen predominantly in posterior head regions, symmetric and reactive to eye opening and eye closing. Sleep was characterized by vertex waves, sleep spindles  (12-14Hz ), maximal frontocentral region. EEG showed continuous polymorphic sharply contoured 3 to 6 Hz theta-delta slowing I left frontal region consistent with breach artifact.There is also intermittent generalized 3-6hz  theta-delta slowing.   ABNORMALITY - Breach artifact, left frontal region - Intermittent slow, generalized  IMPRESSION: This study is suggestive of cortical dysfunction arising from left frontal region consistent with prior craniotomy. There is also mild diffuse encephalopathy, nonspecific etiology. No seizures or definite epileptiform discharges were seen throughout the recording.  Lora Havens   ECHOCARDIOGRAM COMPLETE  Result Date: 05/04/2021    ECHOCARDIOGRAM REPORT   Patient Name:   AZORA BONZO Date of Exam: 05/04/2021 Medical Rec #:  [id number]      Height:       65.0 in Accession #:    [id number]     Weight:       288.7 lb Date of Birth:  [birth year]       BSA:          2.312 m Patient Age:    60 years       BP:           162/99 mmHg Patient Gender: F              HR:           61 bpm. Exam Location:  Outpatient Procedure: 2D Echo, Cardiac Doppler, Color Doppler and Strain Analysis Indications:    Chemo Z09  History:        Patient has prior history of Echocardiogram examinations, most                 recent 11/18/2020.  Sonographer:    Bernadene Person RDCS Referring Phys: [id number] Nicholas Lose IMPRESSIONS  1. Left ventricular ejection fraction, by  estimation, is 50 to 55%. The left ventricle has low normal function. The left ventricle has no regional wall motion abnormalities. Left ventricular diastolic parameters are consistent with Grade I diastolic dysfunction (impaired relaxation). The average left ventricular global longitudinal strain is -18.3 %. The global longitudinal strain is normal.  2. Right ventricular systolic function is normal. The right ventricular size is normal. Tricuspid regurgitation signal is inadequate for assessing PA pressure.  3. The mitral valve is grossly  normal. Trivial mitral valve regurgitation. No evidence of mitral stenosis.  4. The aortic valve is tricuspid. Aortic valve regurgitation is not visualized. No aortic stenosis is present.  5. The inferior vena cava is normal in size with greater than 50% respiratory variability, suggesting right atrial pressure of 3 mmHg. Comparison(s): Changes from prior study are noted. EF now 50-55% compared with prior echo. GLS -18.5 which remains normal. FINDINGS  Left Ventricle: Left ventricular ejection fraction, by estimation, is 50 to 55%. The left ventricle has low normal function. The left ventricle has no regional wall motion abnormalities. The average left ventricular global longitudinal strain is -18.3 %. The global longitudinal strain is normal. The left ventricular internal cavity size was normal in size. There is no left ventricular hypertrophy. Left ventricular diastolic parameters are consistent with Grade I diastolic dysfunction (impaired relaxation). Right Ventricle: The right ventricular size is normal. No increase in right ventricular wall thickness. Right ventricular systolic function is normal. Tricuspid regurgitation signal is inadequate for assessing PA pressure. Left Atrium: Left atrial size was normal in size. Right Atrium: Right atrial size was normal in size. Pericardium: There is no evidence of pericardial effusion. Mitral Valve: The mitral valve is grossly normal. Trivial mitral valve regurgitation. No evidence of mitral valve stenosis. Tricuspid Valve: The tricuspid valve is grossly normal. Tricuspid valve regurgitation is trivial. No evidence of tricuspid stenosis. Aortic Valve: The aortic valve is tricuspid. Aortic valve regurgitation is not visualized. No aortic stenosis is present. Pulmonic Valve: The pulmonic valve was grossly normal. Pulmonic valve regurgitation is not visualized. No evidence of pulmonic stenosis. Aorta: The aortic root and ascending aorta are structurally normal, with no  evidence of dilitation. Venous: The inferior vena cava is normal in size with greater than 50% respiratory variability, suggesting right atrial pressure of 3 mmHg. IAS/Shunts: The atrial septum is grossly normal.  LEFT VENTRICLE PLAX 2D LVIDd:         5.00 cm      Diastology LVIDs:         3.70 cm      LV e' medial:    4.73 cm/s LV PW:         1.10 cm      LV E/e' medial:  7.6 LV IVS:        1.00 cm      LV e' lateral:   6.83 cm/s LVOT diam:     2.20 cm      LV E/e' lateral: 5.3 LV SV:         86 LV SV Index:   37           2D Longitudinal Strain LVOT Area:     3.80 cm     2D Strain GLS Avg:     -18.3 %  LV Volumes (MOD) LV vol d, MOD A2C: 136.0 ml LV vol d, MOD A4C: 131.0 ml LV vol s, MOD A2C: 66.6 ml LV vol s, MOD A4C: 65.2 ml LV SV MOD A2C:  69.4 ml LV SV MOD A4C:     131.0 ml LV SV MOD BP:      70.1 ml RIGHT VENTRICLE RV S prime:     8.40 cm/s TAPSE (M-mode): 2.0 cm LEFT ATRIUM             Index       RIGHT ATRIUM           Index LA diam:        3.60 cm 1.56 cm/m  RA Area:     15.50 cm LA Vol (A2C):   65.4 ml 28.28 ml/m RA Volume:   38.30 ml  16.56 ml/m LA Vol (A4C):   66.2 ml 28.63 ml/m LA Biplane Vol: 69.3 ml 29.97 ml/m  AORTIC VALVE LVOT Vmax:   104.00 cm/s LVOT Vmean:  66.800 cm/s LVOT VTI:    0.225 m  AORTA Ao Root diam: 3.70 cm Ao Asc diam:  3.50 cm MITRAL VALVE MV Area (PHT): 1.22 cm    SHUNTS MV Decel Time: 623 msec    Systemic VTI:  0.22 m MV E velocity: 36.00 cm/s  Systemic Diam: 2.20 cm MV A velocity: 84.00 cm/s MV E/A ratio:  0.43 Eleonore Chiquito MD Electronically signed by Eleonore Chiquito MD Signature Date/Time: 05/04/2021/1:45:32 PM    Final    US Abdomen Limited RUQ (LIVER/GB)  Result Date: 05/20/2021 CLINICAL DATA:  Initial evaluation for nausea, vomiting. EXAM: ULTRASOUND ABDOMEN LIMITED RIGHT UPPER QUADRANT COMPARISON:  CT from 10/20/2020. FINDINGS: Gallbladder: Small amount of layering sludge seen within the gallbladder lumen. No shadowing echogenic stones. Gallbladder wall measures  within normal limits at 2.9 mm. No significant free pericholecystic fluid. No sonographic Murphy sign elicited on exam. Common bile duct: Diameter: 3.8 mm Liver: No focal lesion identified. Within normal limits in parenchymal echogenicity. Portal vein is patent on color Doppler imaging with normal direction of blood flow towards the liver. Other: None. IMPRESSION: 1. Gallbladder sludge. No cholelithiasis or evidence for acute cholecystitis. 2. No biliary dilatation. 3. Normal sonographic appearance of the liver. Electronically Signed   By: Jeannine Boga M.D.   On: 05/20/2021 02:03     Subjective: She is alert, oriented to person and place. She denies pain.    Discharge Exam: Vitals:   05/21/21 0338 05/21/21 0736  BP: (!) 151/106 (!) 153/101  Pulse: (!) 56 (!) 56  Resp: 17 20  Temp: 97.8 F (36.6 C) 97.7 F (36.5 C)  SpO2: 95% 99%     General: Pt is alert, awake, not in acute distress Cardiovascular: RRR, S1/S2 +, no rubs, no gallops Respiratory: CTA bilaterally, no wheezing, no rhonchi Abdominal: Soft, NT, ND, bowel sounds + Extremities: no edema, no cyanosis    The results of significant diagnostics from this hospitalization (including imaging, microbiology, ancillary and laboratory) are listed below for reference.     Microbiology: Recent Results (from the past 240 hour(s))  SARS CORONAVIRUS 2 (TAT 6-24 HRS) Nasopharyngeal Nasopharyngeal Swab     Status: None   Collection Time: 05/20/21  4:01 AM   Specimen: Nasopharyngeal Swab  Result Value Ref Range Status   SARS Coronavirus 2 NEGATIVE NEGATIVE Final    Comment: (NOTE) SARS-CoV-2 target nucleic acids are NOT DETECTED.  The SARS-CoV-2 RNA is generally detectable in upper and lower respiratory specimens during the acute phase of infection. Negative results do not preclude SARS-CoV-2 infection, do not rule out co-infections with other pathogens, and should not be used as the sole basis for treatment or other  patient management decisions. Negative results must be combined with clinical observations, patient history, and epidemiological information. The expected result is Negative.  Fact Sheet for Patients: SugarRoll.be  Fact Sheet for Healthcare Providers: https://www.woods-mathews.com/  This test is not yet approved or cleared by the [place] FDA and  has been authorized for detection and/or diagnosis of SARS-CoV-2 by FDA under an Emergency Use Authorization (EUA). This EUA will remain  in effect (meaning this test can be used) for the duration of the COVID-19 declaration under Se ction 564(b)(1) of the Act, 21 U.S.C. section 360bbb-3(b)(1), unless the authorization is terminated or revoked sooner.  Performed at [facility], [place] [address] [place], [place] [address]      Labs: BNP (last 3 results) No results for input(s): BNP in the last 8760 hours. Basic Metabolic Panel: Recent Labs  Lab 05/19/21 1915 05/19/21 2053 05/20/21 0900 05/21/21 0157  NA 138 139 139 137  K 3.9 3.7 3.2* 3.6  CL 101  --  104 103  CO2 27  --  28 28  GLUCOSE 102*  --  89 95  BUN 8  --  8 8  CREATININE 0.89  --  0.86 0.84  CALCIUM 9.2  --  8.9 8.5*  MG  --   --  1.9  --    Liver Function Tests: Recent Labs  Lab 05/19/21 1915  AST 16  ALT 11  ALKPHOS 60  BILITOT 0.7  PROT 7.3  ALBUMIN 3.5   No results for input(s): LIPASE, AMYLASE in the last 168 hours. Recent Labs  Lab 05/19/21 1916  AMMONIA 19   CBC: Recent Labs  Lab 05/19/21 1915 05/19/21 2053 05/21/21 0157  WBC 4.2  --  3.8*  NEUTROABS 2.6  --   --   HGB 13.0 13.3 11.9*  HCT 40.1 39.0 36.4  MCV 95.2  --  94.5  PLT 171  --  157   Cardiac Enzymes: No results for input(s): CKTOTAL, CKMB, CKMBINDEX, TROPONINI in the last 168 hours. BNP: Invalid input(s): POCBNP CBG: Recent Labs  Lab 05/20/21 0607 05/20/21 1651 05/20/21 1958 05/21/21 0012 05/21/21 0334  GLUCAP 92  102* 135* 91 100*   D-Dimer No results for input(s): DDIMER in the last 72 hours. Hgb A1c No results for input(s): HGBA1C in the last 72 hours. Lipid Profile No results for input(s): CHOL, HDL, LDLCALC, TRIG, CHOLHDL, LDLDIRECT in the last 72 hours. Thyroid function studies No results for input(s): TSH, T4TOTAL, T3FREE, THYROIDAB in the last 72 hours.  Invalid input(s): FREET3 Anemia work up No results for input(s): VITAMINB12, FOLATE, FERRITIN, TIBC, IRON, RETICCTPCT in the last 72 hours. Urinalysis    Component Value Date/Time   COLORURINE YELLOW 05/19/2021 2237   APPEARANCEUR CLEAR 05/19/2021 2237   LABSPEC 1.014 05/19/2021 2237   PHURINE 9.0 (H) 05/19/2021 2237   GLUCOSEU NEGATIVE 05/19/2021 2237   HGBUR NEGATIVE 05/19/2021 2237   BILIRUBINUR NEGATIVE 05/19/2021 2237   KETONESUR NEGATIVE 05/19/2021 2237   PROTEINUR NEGATIVE 05/19/2021 2237   NITRITE NEGATIVE 05/19/2021 2237   LEUKOCYTESUR NEGATIVE 05/19/2021 2237   Sepsis Labs Invalid input(s): PROCALCITONIN,  WBC,  LACTICIDVEN Microbiology Recent Results (from the past 240 hour(s))  SARS CORONAVIRUS 2 (TAT 6-24 HRS) Nasopharyngeal Nasopharyngeal Swab     Status: None   Collection Time: 05/20/21  4:01 AM   Specimen: Nasopharyngeal Swab  Result Value Ref Range Status   SARS Coronavirus 2 NEGATIVE NEGATIVE Final    Comment: (NOTE) SARS-CoV-2 target nucleic acids  are NOT DETECTED.  The SARS-CoV-2 RNA is generally detectable in upper and lower respiratory specimens during the acute phase of infection. Negative results do not preclude SARS-CoV-2 infection, do not rule out co-infections with other pathogens, and should not be used as the sole basis for treatment or other patient management decisions. Negative results must be combined with clinical observations, patient history, and epidemiological information. The expected result is Negative.  Fact Sheet for Patients: SugarRoll.be  Fact  Sheet for Healthcare Providers: https://www.woods-mathews.com/  This test is not yet approved or cleared by the [place] FDA and  has been authorized for detection and/or diagnosis of SARS-CoV-2 by FDA under an Emergency Use Authorization (EUA). This EUA will remain  in effect (meaning this test can be used) for the duration of the COVID-19 declaration under Se ction 564(b)(1) of the Act, 21 U.S.C. section 360bbb-3(b)(1), unless the authorization is terminated or revoked sooner.  Performed at [facility], [place] [address] [place], [place] [address]      Time coordinating discharge: 40 minutes  SIGNED:   Elmarie Shiley, MD  [facility]

## 2021-05-21 NOTE — TOC Transition Note (Signed)
Transition of Care [facility]) - CM/SW Discharge Note   Patient Details  Name: Monica Mccoy MRN: [id number] Date of Birth: 02/19/1966  Transition of Care [facility]) CM/SW Contact:  Carles Collet, RN Phone Number: 05/21/2021, 3:05 PM   Clinical Narrative:   Damaris Schooner w patient and sister. RW requested, ordered, will be delivered to room. Liberal set up w [facility] per sister's preference. No other TOC needs identified.     Final next level of care: [place] Barriers to Discharge: No Barriers Identified   Patient Goals and CMS Choice Patient states their goals for this hospitalization and ongoing recovery are:: to go home CMS Medicare.gov Compare Post Acute Care list provided to:: Other (Comment Required) Choice offered to / list presented to : Sibling  Discharge Placement                       Discharge Plan and Services                DME Arranged: [place] rolling DME Agency: [employer] Date DME Agency Contacted: 05/21/21 Time DME Agency Contacted: [id number] Representative spoke with at DME Agency: [place]: PT [place]: [place] Date [place]: 05/21/21 Time [place]: [place] Representative spoke with at [place]: Amy  Social Determinants of Health ([place]) Interventions     Readmission Risk Interventions No flowsheet data found.

## 2021-05-21 NOTE — Procedures (Addendum)
Patient Name: Monica Mccoy  MRN: [id number]  Epilepsy Attending: Lora Havens  Referring Physician/Provider: Dr Lynnae Sandhoff Duration: 05/20/2021 0855 to 05/21/2021 1102   Patient history:  55 y.o. 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 Chart review revealed there may be a history of seizure and she was previously on LEV which she stopped on her own without reported seizures. She was then started on VPA for migraine and mood in setting of presumed seizure. EEG to evaluate for seizure   Level of alertness: Awake, asleep   AEDs during EEG study: None   Technical aspects: This EEG study was done with scalp electrodes positioned according to the 10-20 International system of electrode placement. Electrical activity was acquired at a sampling rate of 500Hz  and reviewed with a high frequency filter of 70Hz  and a low frequency filter of 1Hz . EEG data were recorded continuously and digitally stored.    Description: The posterior dominant rhythm consists of 8 Hz activity of moderate voltage (25-35 uV) seen predominantly in posterior head regions, symmetric and reactive to eye opening and eye closing. Sleep was characterized by vertex waves, sleep spindles (12-14Hz ), maximal frontocentral region. EEG showed continuous polymorphic sharply contoured 3 to 6 Hz theta-delta slowing I left frontal region consistent with breach artifact.There is also intermittent generalized 3-6hz  theta-delta slowing.     ABNORMALITY - Breach artifact, left frontal region - Intermittent slow, generalized   IMPRESSION: This study is suggestive of cortical dysfunction arising from left frontal region consistent with prior craniotomy. There is also mild diffuse encephalopathy, nonspecific etiology. No seizures  or definite epileptiform discharges were seen throughout the recording.   Tkeya Stencil Barbra Sarks

## 2021-05-23 ENCOUNTER — Telehealth: Payer: Self-pay | Admitting: Hematology and Oncology

## 2021-05-23 ENCOUNTER — Telehealth: Payer: Self-pay | Admitting: Family Medicine

## 2021-05-23 ENCOUNTER — Encounter: Payer: Self-pay | Admitting: Podiatry

## 2021-05-23 NOTE — Telephone Encounter (Signed)
Monica Mccoy is needing verbal orders for PT 2weeks 2times, 1week 2times.  She is needing Speech therapy along with Occupational therapy. Pt has been falling a lot and is depressed.  Please advise Cherice from [facility] at [phone].

## 2021-05-23 NOTE — Telephone Encounter (Signed)
Scheduled appointment per 09/27 staff message. Patient is aware.

## 2021-05-23 NOTE — Progress Notes (Signed)
  Subjective:  Patient ID: Monica Mccoy, female    DOB: 09/30/1965,  MRN: [id number]  55 y.o. female presents at risk foot care with history of chemotherapy induced neuropathy and painful porokeratotic lesion(s) b/l 5th digits and painful mycotic toenails that limit ambulation. Painful toenails interfere with ambulation. Aggravating factors include wearing enclosed shoe gear. Pain is relieved with periodic professional debridement. Painful porokeratotic lesions are aggravated when weightbearing with and without shoegear. Pain is relieved with periodic professional debridement.  Patient did see Dr. Cannon Kettle for evaluation of painful corns of b/l 5th digits.  PCP is Dr. Letta Median and last visit was 02/03/2021.  Allergies  Allergen Reactions   Dilaudid [Hydromorphone Hcl] Other (See Comments)    Confusion & hallucinations   Morphine And Related Other (See Comments)    HA   Penicillins    Tramadol     Review of Systems: Negative except as noted in the HPI.   Objective:   Constitutional Pt is a pleasant 55 y.o. African American female morbidly obese in NAD. AAO x 3.   Vascular Capillary fill time to digits <3 seconds b/l lower extremities. Palpable DP pulse(s) b/l lower extremities Faintly palpable PT pulse(s) b/l lower extremities. Pedal hair absent. Lower extremity skin temperature gradient within normal limits. No pain with calf compression b/l.  Neurologic Pt has subjective symptoms of neuropathy. Protective sensation decreased with 10 gram monofilament b/l. Vibratory sensation intact b/l.  Dermatologic Toenails 1-5 b/l elongated, discolored, dystrophic, thickened, crumbly with subungual debris and tenderness to dorsal palpation. Hyperkeratotic lesion(s) L 5th toe and R 5th toe.  No erythema, no edema, no drainage, no fluctuance. Her corns have actually improved and are smaller and more superficial than previous visits.  Orthopedic: Normal muscle strength 5/5 to all lower extremity  muscle groups bilaterally. No pain crepitus or joint limitation noted with ROM b/l. Hammertoe(s) noted to the L 5th toe and R 5th toe.   Radiographs: None Assessment:   1. Pain due to onychomycosis of toenails of both feet   2. Corns   3. Chemotherapy-induced neuropathy ([place])    Plan:  -Examined patient. -Patient to continue soft, supportive shoe gear daily. -Toenails 1-5 b/l were debrided in length and girth with sterile nail nippers and dremel without iatrogenic bleeding.  -Corn(s) L 5th toe and R 5th toe pared utilizing sterile scalpel blade without complication or incident. Total number debrided=2. -Patient to report any pedal injuries to medical professional immediately. -Patient/POA to call should there be question/concern in the interim.  Return in about 3 months (around 08/18/2021).  Marzetta Board, DPM

## 2021-05-24 NOTE — Telephone Encounter (Signed)
Spoke to Charice @ [facility] and advised of response to verbal PT orders.  She will let them know and we will address @upcoming  appointment.  Dm/cma

## 2021-05-29 ENCOUNTER — Inpatient Hospital Stay: Payer: Medicare Other

## 2021-05-29 ENCOUNTER — Other Ambulatory Visit: Payer: Self-pay

## 2021-05-30 ENCOUNTER — Encounter: Payer: Self-pay | Admitting: Family Medicine

## 2021-05-30 ENCOUNTER — Telehealth: Payer: Self-pay

## 2021-05-30 ENCOUNTER — Ambulatory Visit (INDEPENDENT_AMBULATORY_CARE_PROVIDER_SITE_OTHER): Payer: Medicare Other | Admitting: Family Medicine

## 2021-05-30 ENCOUNTER — Encounter: Payer: Self-pay | Admitting: Neurology

## 2021-05-30 VITALS — BP 120/78 | HR 68 | Temp 97.0°F | Ht 65.0 in | Wt 285.6 lb

## 2021-05-30 DIAGNOSIS — F331 Major depressive disorder, recurrent, moderate: Secondary | ICD-10-CM | POA: Diagnosis not present

## 2021-05-30 DIAGNOSIS — C50911 Malignant neoplasm of unspecified site of right female breast: Secondary | ICD-10-CM | POA: Diagnosis not present

## 2021-05-30 DIAGNOSIS — I10 Essential (primary) hypertension: Secondary | ICD-10-CM

## 2021-05-30 DIAGNOSIS — R32 Unspecified urinary incontinence: Secondary | ICD-10-CM | POA: Diagnosis not present

## 2021-05-30 DIAGNOSIS — G9349 Other encephalopathy: Secondary | ICD-10-CM

## 2021-05-30 DIAGNOSIS — Z23 Encounter for immunization: Secondary | ICD-10-CM | POA: Diagnosis not present

## 2021-05-30 DIAGNOSIS — C7931 Secondary malignant neoplasm of brain: Secondary | ICD-10-CM

## 2021-05-30 DIAGNOSIS — H543 Unqualified visual loss, both eyes: Secondary | ICD-10-CM

## 2021-05-30 NOTE — Telephone Encounter (Signed)
Patients sister, [name] notified and thanks you for all you do. Dm/cma

## 2021-05-30 NOTE — Telephone Encounter (Signed)
-----   Message from Haydee Salter, MD sent at 05/30/2021  1:06 PM EDT ----- Let MS. Sann's sister, [name], know that I decided to move ahead with a neurology evaluation after reviewing the medical records. Ms. Cohill needs a local neurologist to assist regarding her seizure meds, as well as evaluation of her current confusion/memory issues.

## 2021-05-30 NOTE — Progress Notes (Signed)
[facility] [address] [place] [address] Dept: [phone] Dept Fax: [phone]  Transfer of Care Office Visit  Subjective:    Patient ID: Monica Mccoy, female    DOB: 1966-07-22, 55 y.o..   MRN: [id number]  Chief Complaint  Patient presents with   Establish Care    [facility]- establish care.   Wants flu shot today.   C/o having bilateral feet pain, frequent urination and would like referral to dentist and eye doctor.      History of Present Illness:  Patient is in today to establish care. Monica Mccoy was born in [place], [place]. She moved to [place] about 1 1/2 years ago, around the time of her sister's death. She has been living with her mother Monica Mccoy) who was moving to [facility] for other family to help assist with care. Monica Mccoy sister, Monica Mccoy, helps provide some assistance for Monica Mccoy as well. Monica Mccoy is divorced. She has three children (two daughters, ages 75 and 54 that live in [place]; a son, Monica Mccoy, who is 27 y0 and lives with her). 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 She does not use tobacco. She drinks occasionally and also occasionally uses marijuana.  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 BRCA gene testing was negative. This was diagnosed in Oct. 2013. She underwent bilateral mastectomies in Dec. 2013. This was followed with chemotherapy and radiation. In April 2015, she was found to have two frontal lobe tumors. She underwent resection, again followed with radiation and chemotherapy. She is currently on Herceptin maintenance therapy under the care of Monica Mccoy.  Monica Mccoy has a history of recurrent headache. It is unclear if this is primarily migraines or related to her prior cranial surgery and radiation. When she has a severe headache, she  covers her head with a sheet and takes an occasional tramadol.  Monica Mccoy apparently has had seizures associated with her prior brain tumors/craniotomy. She is managed on Depakote and has not had recent seizure activity. Monica Mccoy also has some chronic issues related to memory, gait disturbance, and vision.  Monica Mccoy is being managed on Duloxetine related to chronic depression but also chronic pain. her pain invovles headaches, neck pain, and chest pain. She is also on gabapentin and tramadol for these.  Monica Mccoy has a history of urinary incontinence. She is currently manage on mirabegron (Myrbetriq). Despite this, she is frequently incontinent of urine. Her sister notes that she has been through quite a bit of prior evaluation for this.  Monica Mccoy was recently admitted to [facility] with acute encephalopathy. Apparently she was found to have a low calcium. A head CT had shown possible concern for a new brain lesion, however, follow-up MRI showed more chronic calcifications likely related to her prior tumor excision.  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 [facility]) 09/22/2020   Hx of breast cancer 06/29/2020   Hx of seizure disorder 06/29/2020   Gastroesophageal reflux disease without esophagitis 06/29/2020   Other chronic pain 06/29/2020   Moderate episode of recurrent major depressive disorder (HCC) 06/29/2020   Urinary incontinence 06/29/2020  Past Surgical History:  Procedure Laterality Date   BRAIN SURGERY     brain tumor   MASTECTOMY Bilateral    ORIF ANKLE FRACTURE Right    Family History  Problem Relation Age of Onset   Obesity Mother    Hypertension Mother    Hyperlipidemia Mother    Diabetes Mother    Heart disease Mother    Outpatient Medications Prior  to Visit  Medication Sig Dispense Refill   acetaminophen (TYLENOL) 500 MG tablet Take 1,000 mg by mouth every 6 (six) hours as needed.     amLODipine (NORVASC) 5 MG tablet Take 1 tablet (5 mg total) by mouth daily. 30 tablet 3   divalproex (DEPAKOTE) 500 MG DR tablet Take 1 tablet (500 mg total) by mouth 2 (two) times daily. 60 tablet 3   DULoxetine (CYMBALTA) 60 MG capsule Take 1 capsule (60 mg total) by mouth daily. 90 capsule 1   gabapentin (NEURONTIN) 300 MG capsule Take 1 capsule (300 mg total) by mouth 3 (three) times daily. 90 capsule 3   MELATONIN GUMMIES PO Take 10 mg by mouth.     mirabegron ER (MYRBETRIQ) 50 MG TB24 tablet Take 1 tablet (50 mg total) by mouth daily. 90 tablet 1   traMADol (ULTRAM) 50 MG tablet Take 2 tablets (100 mg total) by mouth 2 (two) times daily as needed. 60 tablet 0   calcium carbonate (OS-CAL - DOSED IN MG OF ELEMENTAL CALCIUM) 1250 (500 Ca) MG tablet Take 1 tablet (500 mg of elemental calcium total) by mouth 3 (three) times daily with meals. (Patient not taking: Reported on 05/30/2021) 90 tablet 0   No facility-administered medications prior to visit.   Allergies  Allergen Reactions   Dilaudid [Hydromorphone Hcl] Other (See Comments)    Confusion & hallucinations   Morphine And Related Other (See Comments)    HA   Penicillins    Tramadol    Objective:   Today's Vitals   05/30/21 1012  BP: 120/78  Pulse: 68  Temp: (!) 97 F (36.1 C)  TempSrc: Temporal  SpO2: 98%  Weight: 285 lb 9.6 oz (129.5 kg)  Height: $Remove'5\' 5"'GEeBKbC$  (1.651 m)   Body mass index is 47.53 kg/m.   General: Well developed, well nourished. No acute distress. Psych: Alert and oriented. Normal mood and affect.  Health Maintenance Due  Topic Date Due   Hepatitis C Screening  Never done   TETANUS/TDAP  Never done   Zoster Vaccines- Shingrix (1 of 2) Never done   PAP SMEAR-Modifier  Never done   COLONOSCOPY (Pts 45-16yrs Insurance coverage will need to be confirmed)  Never done    COVID-19 Vaccine (3 - Moderna risk series) 02/16/2020     Imaging: CT of Head wo contrast (05/19/2021) IMPRESSION: There are irregular foci of hyperdensity within the pons, at least partly (and likely entirely) reflecting calcification (a history of prior whole-brain radiation is noted). However, given the patient's symptoms and given that these foci are new as compared to the brain MRI of 10/20/2020, a short-interval follow-up noncontrast head CT is recommended to ensure stability and to exclude any acute hemorrhage at this site.   Redemonstrated chronic encephalomalacia and gliosis within the left frontal lobe, deep to left-sided cranioplasties.   Background moderate/advanced cerebral white matter disease, likely reflecting a combination of treatment-related changes and chronic small vessel ischemia.   Mild generalized parenchymal atrophy.   Minimal right sphenoid sinus mucosal thickening.   Bilateral mastoid effusions.  MRI of brain  w/wo contrast (05/20/2021) IMPRESSION: 1. No acute intracranial abnormality. Parenchymal hyperdensity noted on prior CT most consistent with calcification/mineralization. 2. Postoperative changes from prior left frontal craniotomy with underlying encephalomalacia/gliosis. 3. Atrophy with advanced cerebral white matter changes elsewhere within the brain, likely reflecting post treatment changes. Few scattered associated chronic microhemorrhages and/or calcifications, a few of which are new as compared to previous MRI from 10/20/2020.  RUQ Abdominal Ultrasound (05/20/2021) IMPRESSION: 1. Gallbladder sludge. No cholelithiasis or evidence for acute cholecystitis. 2. No biliary dilatation. 3. Normal sonographic appearance of the liver. Assessment & Plan:   1. Cancer of right breast metastatic to brain [facility]) Reviewed prior oncology notes and recent discharge summary. I will check a CBC and CMP today to follow-up on abnormal labs related to her recent admission.  Ms. Miyazaki has a follow-up scheduled next week with oncology. I will refer her to ophthalmology to assess complaints of decreased vision, esp. in terms of prior head radiation therapy.  - Ambulatory referral to Ophthalmology - CBC; Future - Comprehensive metabolic panel; Future  2. Essential hypertension Ms. Diosdado's blood pressure is at goal. Continue amlodipine.  3. Moderate episode of recurrent major depressive disorder (HCC) Mood appears good currently. Continue duloxetine.  4. Urinary incontinence, unspecified type As she is having breakthrough incontinence on her current therapy and has not established with a local urologist since she moved here, I will refer Ms. Valere Dross to urology for an assessment and recommendations regarding medical therapy for this. I did encourage use of adult diapers presently.  - Ambulatory referral to Urology  5. Decreased vision in both eyes As above.  - Ambulatory referral to Ophthalmology  6. Encephalopathy chronic It appears that Ms. Degrave may have had some issues with IADLs since her surgery, but had a recent acute encephalopathy as well. She is also on antiseizure meds and has not established with a local neurologist, so I will refer her for evaluation.   Haydee Salter, MD

## 2021-06-01 ENCOUNTER — Other Ambulatory Visit (INDEPENDENT_AMBULATORY_CARE_PROVIDER_SITE_OTHER): Payer: Medicare Other

## 2021-06-01 ENCOUNTER — Other Ambulatory Visit: Payer: Self-pay

## 2021-06-01 DIAGNOSIS — C7931 Secondary malignant neoplasm of brain: Secondary | ICD-10-CM

## 2021-06-01 DIAGNOSIS — C50911 Malignant neoplasm of unspecified site of right female breast: Secondary | ICD-10-CM | POA: Diagnosis not present

## 2021-06-02 ENCOUNTER — Telehealth: Payer: Self-pay | Admitting: Family Medicine

## 2021-06-02 ENCOUNTER — Encounter: Payer: Self-pay | Admitting: Hematology and Oncology

## 2021-06-02 LAB — COMPREHENSIVE METABOLIC PANEL
ALT: 10 U/L (ref 0–35)
AST: 15 U/L (ref 0–37)
Albumin: 3.8 g/dL (ref 3.5–5.2)
Alkaline Phosphatase: 58 U/L (ref 39–117)
BUN: 12 mg/dL (ref 6–23)
CO2: 32 mEq/L (ref 19–32)
Calcium: 9 mg/dL (ref 8.4–10.5)
Chloride: 102 mEq/L (ref 96–112)
Creatinine, Ser: 0.82 mg/dL (ref 0.40–1.20)
GFR: 80.91 mL/min (ref >60.00)
Glucose, Bld: 78 mg/dL (ref 70–99)
Potassium: 4.1 mEq/L (ref 3.5–5.1)
Sodium: 140 mEq/L (ref 135–145)
Total Bilirubin: 0.7 mg/dL (ref 0.2–1.2)
Total Protein: 6.8 g/dL (ref 6.0–8.3)

## 2021-06-02 LAB — CBC
HCT: 36.8 % (ref 36.0–46.0)
Hemoglobin: 12.2 g/dL (ref 12.0–15.0)
MCHC: 33 g/dL (ref 30.0–36.0)
MCV: 92.9 fl (ref 78.0–100.0)
Platelets: 149 10*3/uL — ABNORMAL LOW (ref 150.0–400.0)
RBC: 3.96 Mil/uL (ref 3.87–5.11)
RDW: 13.4 % (ref 11.5–15.5)
WBC: 3.7 10*3/uL — ABNORMAL LOW (ref 4.0–10.5)

## 2021-06-02 NOTE — Telephone Encounter (Signed)
Lft VM to rtn call. Dm/cma  

## 2021-06-02 NOTE — Telephone Encounter (Signed)
Monica Mccoy is needing the verbal orders, now that pt has been seen by Dr. Gena Fray. Please advise Cherice from [facility] at [phone].

## 2021-06-02 NOTE — Telephone Encounter (Signed)
error 

## 2021-06-02 NOTE — Telephone Encounter (Signed)
Spoke to [employer],  She will have them fax over orders for speech and OT.  Dm/cma

## 2021-06-05 ENCOUNTER — Inpatient Hospital Stay: Payer: Medicare Other

## 2021-06-05 ENCOUNTER — Other Ambulatory Visit: Payer: Self-pay

## 2021-06-05 ENCOUNTER — Inpatient Hospital Stay: Payer: Medicare Other | Attending: Internal Medicine

## 2021-06-05 ENCOUNTER — Other Ambulatory Visit: Payer: Self-pay | Admitting: Hematology and Oncology

## 2021-06-05 VITALS — BP 142/99 | HR 65 | Temp 97.8°F | Resp 18 | Wt 287.5 lb

## 2021-06-05 DIAGNOSIS — C50411 Malignant neoplasm of upper-outer quadrant of right female breast: Secondary | ICD-10-CM | POA: Insufficient documentation

## 2021-06-05 DIAGNOSIS — Z9221 Personal history of antineoplastic chemotherapy: Secondary | ICD-10-CM | POA: Diagnosis not present

## 2021-06-05 DIAGNOSIS — Z79899 Other long term (current) drug therapy: Secondary | ICD-10-CM | POA: Diagnosis not present

## 2021-06-05 DIAGNOSIS — C7931 Secondary malignant neoplasm of brain: Secondary | ICD-10-CM | POA: Insufficient documentation

## 2021-06-05 DIAGNOSIS — Z9013 Acquired absence of bilateral breasts and nipples: Secondary | ICD-10-CM | POA: Insufficient documentation

## 2021-06-05 DIAGNOSIS — C50911 Malignant neoplasm of unspecified site of right female breast: Secondary | ICD-10-CM

## 2021-06-05 DIAGNOSIS — Z5112 Encounter for antineoplastic immunotherapy: Secondary | ICD-10-CM | POA: Insufficient documentation

## 2021-06-05 DIAGNOSIS — Z923 Personal history of irradiation: Secondary | ICD-10-CM | POA: Diagnosis not present

## 2021-06-05 DIAGNOSIS — Z95828 Presence of other vascular implants and grafts: Secondary | ICD-10-CM

## 2021-06-05 LAB — CBC WITH DIFFERENTIAL (CANCER CENTER ONLY)
Abs Immature Granulocytes: 0.01 10*3/uL (ref 0.00–0.07)
Basophils Absolute: 0.1 10*3/uL (ref 0.0–0.1)
Basophils Relative: 1 %
Eosinophils Absolute: 0.1 10*3/uL (ref 0.0–0.5)
Eosinophils Relative: 3 %
HCT: 35.7 % — ABNORMAL LOW (ref 36.0–46.0)
Hemoglobin: 11.7 g/dL — ABNORMAL LOW (ref 12.0–15.0)
Immature Granulocytes: 0 %
Lymphocytes Relative: 27 %
Lymphs Abs: 1.1 10*3/uL (ref 0.7–4.0)
MCH: 30.5 pg (ref 26.0–34.0)
MCHC: 32.8 g/dL (ref 30.0–36.0)
MCV: 93.2 fL (ref 80.0–100.0)
Monocytes Absolute: 0.7 10*3/uL (ref 0.1–1.0)
Monocytes Relative: 16 %
Neutro Abs: 2.1 10*3/uL (ref 1.7–7.7)
Neutrophils Relative %: 53 %
Platelet Count: 145 10*3/uL — ABNORMAL LOW (ref 150–400)
RBC: 3.83 MIL/uL — ABNORMAL LOW (ref 3.87–5.11)
RDW: 13.2 % (ref 11.5–15.5)
WBC Count: 4.1 10*3/uL (ref 4.0–10.5)
nRBC: 0 % (ref 0.0–0.2)

## 2021-06-05 LAB — CMP (CANCER CENTER ONLY)
ALT: 15 U/L (ref 0–44)
AST: 17 U/L (ref 15–41)
Albumin: 3.4 g/dL — ABNORMAL LOW (ref 3.5–5.0)
Alkaline Phosphatase: 56 U/L (ref 38–126)
Anion gap: 9 (ref 5–15)
BUN: 20 mg/dL (ref 6–20)
CO2: 29 mmol/L (ref 22–32)
Calcium: 8.7 mg/dL — ABNORMAL LOW (ref 8.9–10.3)
Chloride: 104 mmol/L (ref 98–111)
Creatinine: 1.06 mg/dL — ABNORMAL HIGH (ref 0.44–1.00)
GFR, Estimated: >60 mL/min (ref >60)
Glucose, Bld: 94 mg/dL (ref 70–99)
Potassium: 3.7 mmol/L (ref 3.5–5.1)
Sodium: 142 mmol/L (ref 135–145)
Total Bilirubin: 0.3 mg/dL (ref 0.3–1.2)
Total Protein: 6.9 g/dL (ref 6.5–8.1)

## 2021-06-05 MED ORDER — TRASTUZUMAB-DKST CHEMO 150 MG IV SOLR
750.0000 mg | Freq: Once | INTRAVENOUS | Status: AC
  Administered 2021-06-05: 750 mg via INTRAVENOUS
  Filled 2021-06-05: qty 35.72

## 2021-06-05 MED ORDER — ACETAMINOPHEN 325 MG PO TABS
650.0000 mg | ORAL_TABLET | Freq: Once | ORAL | Status: AC
  Administered 2021-06-05: 650 mg via ORAL
  Filled 2021-06-05: qty 2

## 2021-06-05 MED ORDER — HEPARIN SOD (PORK) LOCK FLUSH 100 UNIT/ML IV SOLN
500.0000 [IU] | Freq: Once | INTRAVENOUS | Status: AC | PRN
  Administered 2021-06-05: 500 [IU]

## 2021-06-05 MED ORDER — SODIUM CHLORIDE 0.9% FLUSH
10.0000 mL | INTRAVENOUS | Status: DC | PRN
  Administered 2021-06-05: 10 mL

## 2021-06-05 MED ORDER — DIPHENHYDRAMINE HCL 25 MG PO CAPS
50.0000 mg | ORAL_CAPSULE | Freq: Once | ORAL | Status: AC
  Administered 2021-06-05: 50 mg via ORAL
  Filled 2021-06-05: qty 2

## 2021-06-05 MED ORDER — SODIUM CHLORIDE 0.9 % IV SOLN: Freq: Once | INTRAVENOUS | Status: AC

## 2021-06-05 NOTE — Patient Instructions (Signed)
[facility]  Discharge Instructions: Thank you for choosing [facility] to provide your oncology and hematology care.   If you have a lab appointment with the Cancer Center, please go directly to the Cancer Center and check in at the registration area.   Wear comfortable clothing and clothing appropriate for easy access to any Portacath or PICC line.   We strive to give you quality time with your provider. You may need to reschedule your appointment if you arrive late (15 or more minutes).  Arriving late affects you and other patients whose appointments are after yours.  Also, if you miss three or more appointments without notifying the office, you may be dismissed from the clinic at the provider's discretion.      For prescription refill requests, have your pharmacy contact our office and allow 72 hours for refills to be completed.    Today you received the following chemotherapy and/or immunotherapy agents Ogivri      To help prevent nausea and vomiting after your treatment, we encourage you to take your nausea medication as directed.  BELOW ARE SYMPTOMS THAT SHOULD BE REPORTED IMMEDIATELY: *FEVER GREATER THAN 100.4 F (38 C) OR HIGHER *CHILLS OR SWEATING *NAUSEA AND VOMITING THAT IS NOT CONTROLLED WITH YOUR NAUSEA MEDICATION *UNUSUAL SHORTNESS OF BREATH *UNUSUAL BRUISING OR BLEEDING *URINARY PROBLEMS (pain or burning when urinating, or frequent urination) *BOWEL PROBLEMS (unusual diarrhea, constipation, pain near the anus) TENDERNESS IN MOUTH AND THROAT WITH OR WITHOUT PRESENCE OF ULCERS (sore throat, sores in mouth, or a toothache) UNUSUAL RASH, SWELLING OR PAIN  UNUSUAL VAGINAL DISCHARGE OR ITCHING   Items with * indicate a potential emergency and should be followed up as soon as possible or go to the Emergency Department if any problems should occur.  Please show the CHEMOTHERAPY ALERT CARD or IMMUNOTHERAPY ALERT CARD at check-in to the  Emergency Department and triage nurse.  Should you have questions after your visit or need to cancel or reschedule your appointment, please contact [facility]  Dept: [phone]  and follow the prompts.  Office hours are 8:00 a.m. to 4:30 p.m. Monday - Friday. Please note that voicemails left after 4:00 p.m. may not be returned until the following business day.  We are closed weekends and major holidays. You have access to a nurse at all times for urgent questions. Please call the main number to the clinic Dept: [phone] and follow the prompts.   For any non-urgent questions, you may also contact your provider using MyChart. We now offer e-Visits for anyone 18 and older to request care online for non-urgent symptoms. For details visit [email or web address].   Also download the MyChart app! Go to the app store, search "MyChart", open the app, select , and log in with your MyChart username and password.  Due to Covid, a mask is required upon entering the hospital/clinic. If you do not have a mask, one will be given to you upon arrival. For doctor visits, patients may have 1 support person aged 18 or older with them. For treatment visits, patients cannot have anyone with them due to current Covid guidelines and our immunocompromised population.   Trastuzumab injection for infusion What is this medication? TRASTUZUMAB (tras TOO zoo mab) is a monoclonal antibody. It is used to treatbreast cancer and stomach cancer. This medicine may be used for other purposes; ask your health care provider orpharmacist if you have questions. COMMON BRAND NAME(S): Herceptin, Herzuma,   KANJINTI, Ogivri, Ontruzant, Trazimera What should I tell my care team before I take this medication? They need to know if you have any of these conditions: heart disease heart failure lung or breathing disease, like asthma an unusual or allergic reaction to trastuzumab, benzyl alcohol, or  other medications, foods, dyes, or preservatives pregnant or trying to get pregnant breast-feeding How should I use this medication? This drug is given as an infusion into a vein. It is administered in a hospitalor clinic by a specially trained health care professional. Talk to your pediatrician regarding the use of this medicine in children. Thismedicine is not approved for use in children. Overdosage: If you think you have taken too much of this medicine contact apoison control center or emergency room at once. NOTE: This medicine is only for you. Do not share this medicine with others. What if I miss a dose? It is important not to miss a dose. Call your doctor or health careprofessional if you are unable to keep an appointment. What may interact with this medication? This medicine may interact with the following medications: certain types of chemotherapy, such as daunorubicin, doxorubicin, epirubicin, and idarubicin This list may not describe all possible interactions. Give your health care provider a list of all the medicines, herbs, non-prescription drugs, or dietary supplements you use. Also tell them if you smoke, drink alcohol, or use illegaldrugs. Some items may interact with your medicine. What should I watch for while using this medication? Visit your doctor for checks on your progress. Report any side effects. Continue your course of treatment even though you feel ill unless your doctortells you to stop. Call your doctor or health care professional for advice if you get a fever, chills or sore throat, or other symptoms of a cold or flu. Do not treatyourself. Try to avoid being around people who are sick. You may experience fever, chills and shaking during your first infusion. These effects are usually mild and can be treated with other medicines. Report any side effects during the infusion to your health care professional. Fever andchills usually do not happen with later infusions. Do  not become pregnant while taking this medicine or for 7 months after stopping it. Women should inform their doctor if they wish to become pregnant or think they might be pregnant. Women of child-bearing potential will need to have a negative pregnancy test before starting this medicine. There is a potential for serious side effects to an unborn child. Talk to your health care professional or pharmacist for more information. Do not breast-feed an infantwhile taking this medicine or for 7 months after stopping it. Women must use effective birth control with this medicine. What side effects may I notice from receiving this medication? Side effects that you should report to your doctor or health care professionalas soon as possible: allergic reactions like skin rash, itching or hives, swelling of the face, lips, or tongue chest pain or palpitations cough dizziness feeling faint or lightheaded, falls fever general ill feeling or flu-like symptoms signs of worsening heart failure like breathing problems; swelling in your legs and feet unusually weak or tired Side effects that usually do not require medical attention (report to yourdoctor or health care professional if they continue or are bothersome): bone pain changes in taste diarrhea joint pain nausea/vomiting weight loss This list may not describe all possible side effects. Call your doctor for medical advice about side effects. You may report side effects to FDA at1-800-FDA-1088. Where should I keep my   medication? This drug is given in a hospital or clinic and will not be stored at home. NOTE: This sheet is a summary. It may not cover all possible information. If you have questions about this medicine, talk to your doctor, pharmacist, orhealth care provider.  2022 Elsevier/Gold Standard (2016-08-07 14:37:52)   

## 2021-06-07 ENCOUNTER — Ambulatory Visit (INDEPENDENT_AMBULATORY_CARE_PROVIDER_SITE_OTHER): Payer: Medicare Other | Admitting: Neurology

## 2021-06-07 ENCOUNTER — Other Ambulatory Visit: Payer: Self-pay

## 2021-06-07 ENCOUNTER — Encounter: Payer: Self-pay | Admitting: Neurology

## 2021-06-07 VITALS — BP 140/88 | HR 63 | Resp 18 | Ht 62.0 in | Wt 285.0 lb

## 2021-06-07 DIAGNOSIS — G894 Chronic pain syndrome: Secondary | ICD-10-CM

## 2021-06-07 DIAGNOSIS — G40209 Localization-related (focal) (partial) symptomatic epilepsy and epileptic syndromes with complex partial seizures, not intractable, without status epilepticus: Secondary | ICD-10-CM

## 2021-06-07 DIAGNOSIS — G43009 Migraine without aura, not intractable, without status migrainosus: Secondary | ICD-10-CM

## 2021-06-07 DIAGNOSIS — R4189 Other symptoms and signs involving cognitive functions and awareness: Secondary | ICD-10-CM | POA: Diagnosis not present

## 2021-06-07 MED ORDER — GABAPENTIN 300 MG PO CAPS: ORAL_CAPSULE | ORAL | 11 refills | Status: DC
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
## 2021-06-08 ENCOUNTER — Telehealth: Payer: Self-pay | Admitting: Family Medicine

## 2021-06-08 NOTE — Telephone Encounter (Signed)
Monica Mccoy from [facility] is needing OT verbal orders for 1 time for 1 week and 3 times for 2 weeks. Please advise Monica Mccoy at [phone]

## 2021-06-09 NOTE — Telephone Encounter (Signed)
Spoke to [place] and then left a VM with okay to for the OT verbal orders. Dm/cma

## 2021-06-12 ENCOUNTER — Telehealth: Payer: Self-pay

## 2021-06-12 NOTE — Telephone Encounter (Signed)
Received a call from [facility] w/ [facility] @[phone].  She reports the following issues: 1) patient fellon Thursday 10/13 going up a couple steps landing on her bottom.  She is reporting having some muscle tightness in lower back and pain.   2) they walked 25 feet today during therapy: she got light headed, clammy afterwards.  Her BP was 100/60 & 90/60.   3)  Charris noticed a smell and asked patient regarding her urine/depends. Urine is dark in color with no burning.  Patient has been sleeping with a depends that is soaked with urine before bed. 4)  per patients sister, she is not sleeping well at night and sleeping more during the day.    Charrice would like to kno if they can extend the therapy for one more week? Doses she need an appointment to evaluate this?  Please review and advise.  Thanks. Marland Kitchendmc

## 2021-06-13 NOTE — Telephone Encounter (Signed)
Spoke to patients sister, [name],  And scheduled an appt on 06/15/21 @ 9:00 am. Dm/cma

## 2021-06-13 NOTE — Telephone Encounter (Signed)
Charris notified VIA phone and will speak with her sister today to get her scheduled for a f/u appointment. Dm/cma

## 2021-06-15 ENCOUNTER — Ambulatory Visit (INDEPENDENT_AMBULATORY_CARE_PROVIDER_SITE_OTHER): Payer: Medicare Other | Admitting: Family Medicine

## 2021-06-15 ENCOUNTER — Other Ambulatory Visit: Payer: Self-pay

## 2021-06-15 VITALS — BP 116/80 | HR 77 | Temp 97.4°F | Ht 62.0 in | Wt 280.8 lb

## 2021-06-15 DIAGNOSIS — R32 Unspecified urinary incontinence: Secondary | ICD-10-CM | POA: Diagnosis not present

## 2021-06-15 DIAGNOSIS — R300 Dysuria: Secondary | ICD-10-CM | POA: Diagnosis not present

## 2021-06-15 DIAGNOSIS — Z8669 Personal history of other diseases of the nervous system and sense organs: Secondary | ICD-10-CM

## 2021-06-15 MED ORDER — DIVALPROEX SODIUM 500 MG PO DR TAB: 500.0000 mg | DELAYED_RELEASE_TABLET | Freq: Two times a day (BID) | ORAL | 3 refills | Status: DC

## 2021-06-15 NOTE — Progress Notes (Signed)
[facility] [address] [place] [address] Dept: [phone] Dept Fax: [phone]  Office Visit  Subjective:    Patient ID: Monica Mccoy, female    DOB: 06-30-1966, 55 y.o..   MRN: [id number]  Chief Complaint  Patient presents with   Acute Visit    Co having some dysuria.      History of Present Illness:  Patient is in today for evaluation of her urine. For the past week, her urine has been reported ot be very strong smelling. She is also having some dysuria. [name] has a long-standing issue with urinary incontinence. She moved to [place] about a year ago. She was already being treated with mirabegron. She has not established with a local urologist. Her sister notes that [name] is waking up soaked every morning. She does wear adult diapers and has had incontinence during the day as well.  [name] was recently seen by Dr. Delice Lesch. Her consult note is not yet complete. [name] sister notes that she is out of Depakote.   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 [facility]) 09/22/2020   Hx of breast cancer 06/29/2020   Hx of seizure disorder 06/29/2020   Gastroesophageal reflux disease without esophagitis 06/29/2020   Other chronic pain 06/29/2020   Moderate episode of recurrent major depressive disorder ([place]) 06/29/2020   Urinary incontinence 06/29/2020   Past Surgical History:  Procedure Laterality Date   BRAIN SURGERY     brain tumor   MASTECTOMY Bilateral    ORIF ANKLE FRACTURE Right    Family History  Problem Relation Age of Onset   Obesity Mother    Hypertension Mother    Hyperlipidemia Mother    Diabetes Mother    Heart disease Mother    Outpatient Medications Prior to Visit   Medication Sig Dispense Refill   acetaminophen (TYLENOL) 500 MG tablet Take 1,000 mg by mouth every 6 (six) hours as needed.     amLODipine (NORVASC) 5 MG tablet Take 1 tablet (5 mg total) by mouth daily. 30 tablet 3   DULoxetine (CYMBALTA) 60 MG capsule Take 1 capsule (60 mg total) by mouth daily. 90 capsule 1   gabapentin (NEURONTIN) 300 MG capsule Take 1 cap in AM, 1 cap in afternoon, 2 caps at bedtime 120 capsule 11   MELATONIN GUMMIES PO Take 10 mg by mouth.     mirabegron ER (MYRBETRIQ) 50 MG TB24 tablet Take 1 tablet (50 mg total) by mouth daily. 90 tablet 1   traMADol (ULTRAM) 50 MG tablet Take 2 tablets (100 mg total) by mouth 2 (two) times daily as needed. 60 tablet 0   divalproex (DEPAKOTE) 500 MG DR tablet Take 1 tablet (500 mg total) by mouth 2 (two) times daily. 60 tablet 3   No facility-administered medications prior to visit.   Allergies  Allergen Reactions   Dilaudid [Hydromorphone Hcl] Other (See Comments)    Confusion & hallucinations   Morphine And Related Other (See Comments)    HA   Penicillins    Tramadol     Objective:   Today's Vitals   06/15/21 0920  BP: 116/80  Pulse: 77  Temp: (!) 97.4 F (36.3 C)  TempSrc: Temporal  SpO2: 99%  Weight: 280 lb 12.8  oz (127.4 kg)  Height: 5\' 2"  (1.575 m)   Body mass index is 51.36 kg/m.   General: Well developed, well nourished. No acute distress. Psych: Alert and oriented. Normal mood and affect.  Health Maintenance Due  Topic Date Due   Pneumococcal Vaccine 27-9 Years old (1 - PCV) Never done   Hepatitis C Screening  Never done   TETANUS/TDAP  Never done   Zoster Vaccines- Shingrix (1 of 2) Never done   PAP SMEAR-Modifier  Never done   COLONOSCOPY (Pts 45-14yrs Insurance coverage will need to be confirmed)  Never done   COVID-19 Vaccine (3 - Moderna risk series) 02/16/2020     Assessment & Plan:   1. Dysuria [name] is having some dysuria, but was unable to provide a urine sample today. We will  have her sister help collect this at home and bring the sample back for urinalysis.  - Urinalysis w microscopic + reflex cultur  2. Urinary incontinence, unspecified type [name] has a history of incontinence, treated with Myrbetriq, but this is not controlling her issue. I will refer her to establish care here in [place] and determine what other alternatives we might try to help keep her drier.  - Ambulatory referral to Urology  3. Hx of seizure disorder Following with Dr. Delice Lesch. I reviewed her consult note, but this is not compelte. I will renew her Depakote for now.  - divalproex (DEPAKOTE) 500 MG DR tablet; Take 1 tablet (500 mg total) by mouth 2 (two) times daily.  Dispense: 60 tablet; Refill: 3  Haydee Salter, MD

## 2021-06-19 ENCOUNTER — Other Ambulatory Visit: Payer: Medicare Other

## 2021-06-19 ENCOUNTER — Ambulatory Visit: Payer: Medicare Other

## 2021-06-28 ENCOUNTER — Telehealth: Payer: Self-pay | Admitting: Family Medicine

## 2021-06-28 NOTE — Telephone Encounter (Signed)
Spoke to caretaker at number provided.  Was advised that patient has had 2 falls today while going up stairs.  Advised them that they should have them call and make a follow up appt to discuss this with provider. She will let them know.  Dm/cma

## 2021-06-30 ENCOUNTER — Other Ambulatory Visit: Payer: Self-pay | Admitting: Hematology and Oncology

## 2021-07-03 ENCOUNTER — Encounter: Payer: Self-pay | Admitting: Hematology and Oncology

## 2021-07-03 ENCOUNTER — Inpatient Hospital Stay: Payer: Medicare Other

## 2021-07-03 ENCOUNTER — Other Ambulatory Visit: Payer: Self-pay

## 2021-07-03 ENCOUNTER — Telehealth: Payer: Self-pay | Admitting: Family Medicine

## 2021-07-03 ENCOUNTER — Inpatient Hospital Stay: Payer: Medicare Other | Attending: Internal Medicine

## 2021-07-03 VITALS — BP 107/74 | HR 65 | Temp 97.7°F | Resp 18 | Wt 277.5 lb

## 2021-07-03 DIAGNOSIS — C50911 Malignant neoplasm of unspecified site of right female breast: Secondary | ICD-10-CM | POA: Insufficient documentation

## 2021-07-03 DIAGNOSIS — Z5112 Encounter for antineoplastic immunotherapy: Secondary | ICD-10-CM | POA: Diagnosis not present

## 2021-07-03 DIAGNOSIS — Z171 Estrogen receptor negative status [ER-]: Secondary | ICD-10-CM | POA: Diagnosis not present

## 2021-07-03 DIAGNOSIS — C7931 Secondary malignant neoplasm of brain: Secondary | ICD-10-CM | POA: Insufficient documentation

## 2021-07-03 LAB — CMP (CANCER CENTER ONLY)
ALT: 27 U/L (ref 0–44)
AST: 23 U/L (ref 15–41)
Albumin: 3.2 g/dL — ABNORMAL LOW (ref 3.5–5.0)
Alkaline Phosphatase: 63 U/L (ref 38–126)
Anion gap: 6 (ref 5–15)
BUN: 21 mg/dL — ABNORMAL HIGH (ref 6–20)
CO2: 30 mmol/L (ref 22–32)
Calcium: 8.7 mg/dL — ABNORMAL LOW (ref 8.9–10.3)
Chloride: 107 mmol/L (ref 98–111)
Creatinine: 0.84 mg/dL (ref 0.44–1.00)
GFR, Estimated: >60 mL/min (ref >60)
Glucose, Bld: 85 mg/dL (ref 70–99)
Potassium: 3.8 mmol/L (ref 3.5–5.1)
Sodium: 143 mmol/L (ref 135–145)
Total Bilirubin: 0.5 mg/dL (ref 0.3–1.2)
Total Protein: 6.6 g/dL (ref 6.5–8.1)

## 2021-07-03 LAB — CBC WITH DIFFERENTIAL (CANCER CENTER ONLY)
Abs Immature Granulocytes: 0.01 10*3/uL (ref 0.00–0.07)
Basophils Absolute: 0.1 10*3/uL (ref 0.0–0.1)
Basophils Relative: 1 %
Eosinophils Absolute: 0.1 10*3/uL (ref 0.0–0.5)
Eosinophils Relative: 3 %
HCT: 32.8 % — ABNORMAL LOW (ref 36.0–46.0)
Hemoglobin: 10.6 g/dL — ABNORMAL LOW (ref 12.0–15.0)
Immature Granulocytes: 0 %
Lymphocytes Relative: 24 %
Lymphs Abs: 0.9 10*3/uL (ref 0.7–4.0)
MCH: 30.6 pg (ref 26.0–34.0)
MCHC: 32.3 g/dL (ref 30.0–36.0)
MCV: 94.8 fL (ref 80.0–100.0)
Monocytes Absolute: 0.8 10*3/uL (ref 0.1–1.0)
Monocytes Relative: 20 %
Neutro Abs: 2 10*3/uL (ref 1.7–7.7)
Neutrophils Relative %: 52 %
Platelet Count: 144 10*3/uL — ABNORMAL LOW (ref 150–400)
RBC: 3.46 MIL/uL — ABNORMAL LOW (ref 3.87–5.11)
RDW: 13.9 % (ref 11.5–15.5)
WBC Count: 3.9 10*3/uL — ABNORMAL LOW (ref 4.0–10.5)
nRBC: 0 % (ref 0.0–0.2)

## 2021-07-03 MED ORDER — HEPARIN SOD (PORK) LOCK FLUSH 100 UNIT/ML IV SOLN
500.0000 [IU] | Freq: Once | INTRAVENOUS | Status: AC | PRN
  Administered 2021-07-03: 500 [IU]

## 2021-07-03 MED ORDER — SODIUM CHLORIDE 0.9% FLUSH
10.0000 mL | INTRAVENOUS | Status: DC | PRN
  Administered 2021-07-03: 10 mL

## 2021-07-03 MED ORDER — DIPHENHYDRAMINE HCL 25 MG PO CAPS
50.0000 mg | ORAL_CAPSULE | Freq: Once | ORAL | Status: AC
  Administered 2021-07-03: 50 mg via ORAL
  Filled 2021-07-03: qty 2

## 2021-07-03 MED ORDER — ACETAMINOPHEN 325 MG PO TABS
650.0000 mg | ORAL_TABLET | Freq: Once | ORAL | Status: AC
  Administered 2021-07-03: 650 mg via ORAL
  Filled 2021-07-03: qty 2

## 2021-07-03 MED ORDER — TRASTUZUMAB-DKST CHEMO 150 MG IV SOLR
750.0000 mg | Freq: Once | INTRAVENOUS | Status: AC
  Administered 2021-07-03: 750 mg via INTRAVENOUS
  Filled 2021-07-03: qty 35.72

## 2021-07-03 MED ORDER — SODIUM CHLORIDE 0.9 % IV SOLN: Freq: Once | INTRAVENOUS | Status: AC

## 2021-07-03 NOTE — Patient Instructions (Signed)
[facility]  Discharge Instructions: ?Thank you for choosing [facility] to provide your oncology and hematology care.  ? ?If you have a lab appointment with the Cancer Center, please go directly to the Cancer Center and check in at the registration area. ?  ?Wear comfortable clothing and clothing appropriate for easy access to any Portacath or PICC line.  ? ?We strive to give you quality time with your provider. You may need to reschedule your appointment if you arrive late (15 or more minutes).  Arriving late affects you and other patients whose appointments are after yours.  Also, if you miss three or more appointments without notifying the office, you may be dismissed from the clinic at the provider?s discretion.    ?  ?For prescription refill requests, have your pharmacy contact our office and allow 72 hours for refills to be completed.   ? ?Today you received the following chemotherapy and/or immunotherapy agents: Trastuzumab    ?  ?To help prevent nausea and vomiting after your treatment, we encourage you to take your nausea medication as directed. ? ?BELOW ARE SYMPTOMS THAT SHOULD BE REPORTED IMMEDIATELY: ?*FEVER GREATER THAN 100.4 F (38 ?C) OR HIGHER ?*CHILLS OR SWEATING ?*NAUSEA AND VOMITING THAT IS NOT CONTROLLED WITH YOUR NAUSEA MEDICATION ?*UNUSUAL SHORTNESS OF BREATH ?*UNUSUAL BRUISING OR BLEEDING ?*URINARY PROBLEMS (pain or burning when urinating, or frequent urination) ?*BOWEL PROBLEMS (unusual diarrhea, constipation, pain near the anus) ?TENDERNESS IN MOUTH AND THROAT WITH OR WITHOUT PRESENCE OF ULCERS (sore throat, sores in mouth, or a toothache) ?UNUSUAL RASH, SWELLING OR PAIN  ?UNUSUAL VAGINAL DISCHARGE OR ITCHING  ? ?Items with * indicate a potential emergency and should be followed up as soon as possible or go to the Emergency Department if any problems should occur. ? ?Please show the CHEMOTHERAPY ALERT CARD or IMMUNOTHERAPY ALERT CARD at check-in  to the Emergency Department and triage nurse. ? ?Should you have questions after your visit or need to cancel or reschedule your appointment, please contact [facility]  Dept: [phone]  and follow the prompts.  Office hours are 8:00 a.m. to 4:30 p.m. Monday - Friday. Please note that voicemails left after 4:00 p.m. may not be returned until the following business day.  We are closed weekends and major holidays. You have access to a nurse at all times for urgent questions. Please call the main number to the clinic Dept: [phone] and follow the prompts. ? ? ?For any non-urgent questions, you may also contact your provider using MyChart. We now offer e-Visits for anyone 18 and older to request care online for non-urgent symptoms. For details visit [email or web address]. ?  ?Also download the MyChart app! Go to the app store, search "MyChart", open the app, select [facility], and log in with your MyChart username and password. ? ?Due to Covid, a mask is required upon entering the hospital/clinic. If you do not have a mask, one will be given to you upon arrival. For doctor visits, patients may have 1 support person aged 18 or older with them. For treatment visits, patients cannot have anyone with them due to current Covid guidelines and our immunocompromised population.  ? ?

## 2021-07-10 ENCOUNTER — Ambulatory Visit: Payer: Medicare Other

## 2021-07-10 ENCOUNTER — Other Ambulatory Visit: Payer: Medicare Other

## 2021-07-10 ENCOUNTER — Ambulatory Visit: Payer: Medicare Other | Admitting: Hematology and Oncology

## 2021-07-18 ENCOUNTER — Encounter: Payer: Self-pay | Admitting: Physical Medicine and Rehabilitation

## 2021-07-18 ENCOUNTER — Telehealth: Payer: Self-pay | Admitting: Family Medicine

## 2021-07-18 NOTE — Telephone Encounter (Signed)
Please review and advise. Thanks. Dm/cma  

## 2021-07-19 NOTE — Telephone Encounter (Signed)
Form filled out for PT and faxed to home health on 07/18/21. Dm/cma

## 2021-07-30 NOTE — Progress Notes (Incomplete)
Patient Care Team: Loyola Mast, MD as PCP - General (Family Medicine) Serena Croissant, MD as Consulting Physician (Hematology and Oncology) Van Clines, MD as Consulting Physician (Neurology)  DIAGNOSIS: No diagnosis found.  SUMMARY OF ONCOLOGIC HISTORY: Oncology History  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)  06/13/2012 Initial Diagnosis   Right breast biopsy: Invasive ductal carcinoma, grade 3, HER-2 positive (3+), ER/PR negative, Ki67 5-10%, and in the right breast, high grade DCIS. BRCA gene testing was negative   08/06/2012 Surgery   Bilateral mastectomies:  Right breast: Invasive ductal carcinoma, 0.7cm, grade 2, with extensive DCIS, clear margins, 8/11 lymph nodes positive, measuring up to 3.8cm,  Left breast, no evidence of malignancy and 6 lymph nodes negative for carcinoma.    2014 - 2015 Chemotherapy   adjuvant chemotherapy with 4 cycles of dose dense Adriamycin and Cytoxan followed by 4 cycles of Taxol Herceptin and then Herceptin maintenance. She underwent right chest radiation.       12/24/2013 Relapse/Recurrence   Dizziness, headaches, ataxia, and memory loss. Head CT showed a 1.8cm and a 2.0cm frontal lobe masses. She underwent resection of both masses on 01/07/14,: Moderately differentiated carcinoma, likely of breast origin, HER-2 positive, ER/PR negative, CK7 positive.   2015 - 2015 Radiation Therapy   Received whole brain radiation    Chemotherapy   Taxotere, Herceptin and Perjeta, but Taxotere was discontinued after 4 cycles       11/21/2020 -  Chemotherapy   Patient is on Treatment Plan : BREAST Trastuzumab q21d       CHIEF COMPLIANT: Cycle 10 Herceptin  INTERVAL HISTORY: Monica Mccoy is a 55 y.o. with above-mentioned history of metastatic breast cancer currently on Herceptin. She reports to the clinic today for cycle 10.   ALLERGIES:  is allergic to dilaudid [hydromorphone hcl], morphine and related, penicillins, and  tramadol.  MEDICATIONS:  Current Outpatient Medications  Medication Sig Dispense Refill   acetaminophen (TYLENOL) 500 MG tablet Take 1,000 mg by mouth every 6 (six) hours as needed.     amLODipine (NORVASC) 5 MG tablet Take 1 tablet (5 mg total) by mouth daily. 30 tablet 3   divalproex (DEPAKOTE) 500 MG DR tablet Take 1 tablet (500 mg total) by mouth 2 (two) times daily. 60 tablet 3   DULoxetine (CYMBALTA) 60 MG capsule Take 1 capsule (60 mg total) by mouth daily. 90 capsule 1   gabapentin (NEURONTIN) 300 MG capsule Take 1 cap in AM, 1 cap in afternoon, 2 caps at bedtime 120 capsule 11   MELATONIN GUMMIES PO Take 10 mg by mouth.     mirabegron ER (MYRBETRIQ) 50 MG TB24 tablet Take 1 tablet (50 mg total) by mouth daily. 90 tablet 1   traMADol (ULTRAM) 50 MG tablet TAKE 2 TABLETS (100 MG TOTAL) BY MOUTH 2 (TWO) TIMES DAILY AS NEEDED. 60 tablet 0   No current facility-administered medications for this visit.    PHYSICAL EXAMINATION: ECOG PERFORMANCE STATUS: {CHL ONC ECOG PS:[id number]}  There were no vitals filed for this visit. There were no vitals filed for this visit.  LABORATORY DATA:  I have reviewed the data as listed CMP Latest Ref Rng & Units 07/03/2021 06/05/2021 06/01/2021  Glucose 70 - 99 mg/dL 85 94 78  BUN 6 - 20 mg/dL 87(J) 20 12  Creatinine 0.44 - 1.00 mg/dL 9.11 7.99(U) 0.50  Sodium 135 - 145 mmol/L 143 142 140  Potassium 3.5 - 5.1 mmol/L 3.8 3.7 4.1  Chloride 98 -  111 mmol/L 107 104 102  CO2 22 - 32 mmol/L 30 29 32  Calcium 8.9 - 10.3 mg/dL 8.7(L) 8.7(L) 9.0  Total Protein 6.5 - 8.1 g/dL 6.6 6.9 6.8  Total Bilirubin 0.3 - 1.2 mg/dL 0.5 0.3 0.7  Alkaline Phos 38 - 126 U/L 63 56 58  AST 15 - 41 U/L $Remo'23 17 15  'QmCAH$ ALT 0 - 44 U/L $Remo'27 15 10    'dXBzo$ Lab Results  Component Value Date   WBC 3.9 (L) 07/03/2021   HGB 10.6 (L) 07/03/2021   HCT 32.8 (L) 07/03/2021   MCV 94.8 07/03/2021   PLT 144 (L) 07/03/2021   NEUTROABS 2.0 07/03/2021    ASSESSMENT & PLAN:  No  problem-specific Assessment & Plan notes found for this encounter.    No orders of the defined types were placed in this encounter.  The patient has a good understanding of the overall plan. she agrees with it. she will call with any problems that may develop before the next visit here.  Total time spent: *** mins including face to face time and time spent for planning, charting and coordination of care  Rulon Eisenmenger, MD, MPH 07/30/2021  I, Thana Ates, am acting as scribe for Dr. Nicholas Lose.  {insert scribe attestation}

## 2021-07-30 NOTE — Assessment & Plan Note (Deleted)
Moved to [place] from [place], ([facility]), in 02/2020 to live with her parents and son near her sister. °(2013 HER2 positive breast cancer status post bilateral mastectomies and adjuvant chemotherapy with Herceptin, April 2015 brain metastases status post resection and radiation) °  °Prior treatment: Patient was on Herceptin and Perjeta maintenance until June 2021 °Because of insurance issues she has not had treatment for 7 months until she moved to [place]. °  °1. Severe recurrent headaches: Could be related to prior brain radiation.  10/21/2020 brain MRI with and without contrast: No evidence of intracranial metastatic disease.  Postoperative changes left frontal encephalomalacia/gliosis °2. Severe bilateral chest discomfort without any palpable lesions: 10/20/2020: CT CAP: Post Op changes in Rt chest wall. Multilob uterus. °  °  °Current treatment: Herceptin maintenance every 4 weeks started 11/21/2020, after the next scans in February 2023 we can decide on increasing the frequency of infusions. °Herceptin toxicities: None °  °Joint pains/chest wall pains: We will increase her tramadol dosage today. °  °After a year of monthly Herceptin as we can switch to every 6 weeks thereafter. °

## 2021-07-31 ENCOUNTER — Inpatient Hospital Stay: Payer: Medicare Other | Admitting: Hematology and Oncology

## 2021-07-31 ENCOUNTER — Inpatient Hospital Stay: Payer: Medicare Other

## 2021-08-01 ENCOUNTER — Telehealth: Payer: Self-pay | Admitting: Adult Health

## 2021-08-01 NOTE — Telephone Encounter (Signed)
Message has been left with pt about scheduled change

## 2021-08-10 ENCOUNTER — Inpatient Hospital Stay: Payer: Medicare Other

## 2021-08-12 ENCOUNTER — Other Ambulatory Visit: Payer: Self-pay | Admitting: Hematology and Oncology

## 2021-08-14 ENCOUNTER — Other Ambulatory Visit: Payer: Self-pay

## 2021-08-14 ENCOUNTER — Inpatient Hospital Stay: Payer: Medicare Other | Attending: Internal Medicine | Admitting: Adult Health

## 2021-08-14 ENCOUNTER — Inpatient Hospital Stay: Payer: Medicare Other

## 2021-08-14 ENCOUNTER — Other Ambulatory Visit: Payer: Self-pay | Admitting: Hematology and Oncology

## 2021-08-14 VITALS — BP 123/93 | HR 82 | Temp 97.6°F | Resp 18 | Ht 62.0 in | Wt 280.0 lb

## 2021-08-14 DIAGNOSIS — Z5112 Encounter for antineoplastic immunotherapy: Secondary | ICD-10-CM | POA: Diagnosis present

## 2021-08-14 DIAGNOSIS — Z95828 Presence of other vascular implants and grafts: Secondary | ICD-10-CM

## 2021-08-14 DIAGNOSIS — C7931 Secondary malignant neoplasm of brain: Secondary | ICD-10-CM | POA: Diagnosis present

## 2021-08-14 DIAGNOSIS — Z923 Personal history of irradiation: Secondary | ICD-10-CM | POA: Diagnosis not present

## 2021-08-14 DIAGNOSIS — Z171 Estrogen receptor negative status [ER-]: Secondary | ICD-10-CM | POA: Diagnosis not present

## 2021-08-14 DIAGNOSIS — Z9221 Personal history of antineoplastic chemotherapy: Secondary | ICD-10-CM | POA: Diagnosis not present

## 2021-08-14 DIAGNOSIS — Z9013 Acquired absence of bilateral breasts and nipples: Secondary | ICD-10-CM | POA: Diagnosis not present

## 2021-08-14 DIAGNOSIS — C50911 Malignant neoplasm of unspecified site of right female breast: Secondary | ICD-10-CM | POA: Diagnosis present

## 2021-08-14 LAB — CMP (CANCER CENTER ONLY)
ALT: 11 U/L (ref 0–44)
AST: 14 U/L — ABNORMAL LOW (ref 15–41)
Albumin: 3.3 g/dL — ABNORMAL LOW (ref 3.5–5.0)
Alkaline Phosphatase: 70 U/L (ref 38–126)
Anion gap: 9 (ref 5–15)
BUN: 14 mg/dL (ref 6–20)
CO2: 28 mmol/L (ref 22–32)
Calcium: 8.5 mg/dL — ABNORMAL LOW (ref 8.9–10.3)
Chloride: 105 mmol/L (ref 98–111)
Creatinine: 1.07 mg/dL — ABNORMAL HIGH (ref 0.44–1.00)
GFR, Estimated: >60 mL/min (ref >60)
Glucose, Bld: 91 mg/dL (ref 70–99)
Potassium: 3.8 mmol/L (ref 3.5–5.1)
Sodium: 142 mmol/L (ref 135–145)
Total Bilirubin: 0.9 mg/dL (ref 0.3–1.2)
Total Protein: 7 g/dL (ref 6.5–8.1)

## 2021-08-14 LAB — CBC WITH DIFFERENTIAL (CANCER CENTER ONLY)
Abs Immature Granulocytes: 0.01 10*3/uL (ref 0.00–0.07)
Basophils Absolute: 0 10*3/uL (ref 0.0–0.1)
Basophils Relative: 1 %
Eosinophils Absolute: 0.1 10*3/uL (ref 0.0–0.5)
Eosinophils Relative: 2 %
HCT: 36.8 % (ref 36.0–46.0)
Hemoglobin: 11.9 g/dL — ABNORMAL LOW (ref 12.0–15.0)
Immature Granulocytes: 0 %
Lymphocytes Relative: 21 %
Lymphs Abs: 0.9 10*3/uL (ref 0.7–4.0)
MCH: 30.3 pg (ref 26.0–34.0)
MCHC: 32.3 g/dL (ref 30.0–36.0)
MCV: 93.6 fL (ref 80.0–100.0)
Monocytes Absolute: 0.7 10*3/uL (ref 0.1–1.0)
Monocytes Relative: 16 %
Neutro Abs: 2.6 10*3/uL (ref 1.7–7.7)
Neutrophils Relative %: 60 %
Platelet Count: 141 10*3/uL — ABNORMAL LOW (ref 150–400)
RBC: 3.93 MIL/uL (ref 3.87–5.11)
RDW: 13.7 % (ref 11.5–15.5)
WBC Count: 4.2 10*3/uL (ref 4.0–10.5)
nRBC: 0 % (ref 0.0–0.2)

## 2021-08-14 MED ORDER — DIPHENHYDRAMINE HCL 25 MG PO CAPS
50.0000 mg | ORAL_CAPSULE | Freq: Once | ORAL | Status: AC
  Administered 2021-08-14: 16:00:00 50 mg via ORAL
  Filled 2021-08-14: qty 2

## 2021-08-14 MED ORDER — HEPARIN SOD (PORK) LOCK FLUSH 100 UNIT/ML IV SOLN
500.0000 [IU] | Freq: Once | INTRAVENOUS | Status: AC | PRN
  Administered 2021-08-14: 17:00:00 500 [IU]

## 2021-08-14 MED ORDER — SODIUM CHLORIDE 0.9% FLUSH
10.0000 mL | INTRAVENOUS | Status: DC | PRN
  Administered 2021-08-14: 14:00:00 10 mL

## 2021-08-14 MED ORDER — SODIUM CHLORIDE 0.9% FLUSH
10.0000 mL | INTRAVENOUS | Status: DC | PRN
  Administered 2021-08-14: 17:00:00 10 mL

## 2021-08-14 MED ORDER — TRAMADOL HCL 50 MG PO TABS: 100.0000 mg | ORAL_TABLET | Freq: Two times a day (BID) | ORAL | 0 refills | Status: DC | PRN

## 2021-08-14 MED ORDER — TRASTUZUMAB-DKST CHEMO 150 MG IV SOLR
750.0000 mg | Freq: Once | INTRAVENOUS | Status: AC
  Administered 2021-08-14: 16:00:00 750 mg via INTRAVENOUS
  Filled 2021-08-14: qty 35.72

## 2021-08-14 MED ORDER — SODIUM CHLORIDE 0.9 % IV SOLN: Freq: Once | INTRAVENOUS | Status: AC

## 2021-08-14 MED ORDER — ACETAMINOPHEN 325 MG PO TABS
650.0000 mg | ORAL_TABLET | Freq: Once | ORAL | Status: AC
  Administered 2021-08-14: 16:00:00 650 mg via ORAL
  Filled 2021-08-14: qty 2

## 2021-08-14 NOTE — Patient Instructions (Signed)
[facility]   Discharge Instructions: Thank you for choosing [facility] to provide your oncology and hematology care.   If you have a lab appointment with the Cancer Center, please go directly to the Cancer Center and check in at the registration area.   Wear comfortable clothing and clothing appropriate for easy access to any Portacath or PICC line.   We strive to give you quality time with your provider. You may need to reschedule your appointment if you arrive late (15 or more minutes).  Arriving late affects you and other patients whose appointments are after yours.  Also, if you miss three or more appointments without notifying the office, you may be dismissed from the clinic at the provider's discretion.      For prescription refill requests, have your pharmacy contact our office and allow 72 hours for refills to be completed.    Today you received the following chemotherapy and/or immunotherapy agents: trastuzumab-dkst.      To help prevent nausea and vomiting after your treatment, we encourage you to take your nausea medication as directed.  BELOW ARE SYMPTOMS THAT SHOULD BE REPORTED IMMEDIATELY: *FEVER GREATER THAN 100.4 F (38 C) OR HIGHER *CHILLS OR SWEATING *NAUSEA AND VOMITING THAT IS NOT CONTROLLED WITH YOUR NAUSEA MEDICATION *UNUSUAL SHORTNESS OF BREATH *UNUSUAL BRUISING OR BLEEDING *URINARY PROBLEMS (pain or burning when urinating, or frequent urination) *BOWEL PROBLEMS (unusual diarrhea, constipation, pain near the anus) TENDERNESS IN MOUTH AND THROAT WITH OR WITHOUT PRESENCE OF ULCERS (sore throat, sores in mouth, or a toothache) UNUSUAL RASH, SWELLING OR PAIN  UNUSUAL VAGINAL DISCHARGE OR ITCHING   Items with * indicate a potential emergency and should be followed up as soon as possible or go to the Emergency Department if any problems should occur.  Please show the CHEMOTHERAPY ALERT CARD or IMMUNOTHERAPY ALERT CARD at  check-in to the Emergency Department and triage nurse.  Should you have questions after your visit or need to cancel or reschedule your appointment, please contact [facility]  Dept: [phone]  and follow the prompts.  Office hours are 8:00 a.m. to 4:30 p.m. Monday - Friday. Please note that voicemails left after 4:00 p.m. may not be returned until the following business day.  We are closed weekends and major holidays. You have access to a nurse at all times for urgent questions. Please call the main number to the clinic Dept: [phone] and follow the prompts.   For any non-urgent questions, you may also contact your provider using MyChart. We now offer e-Visits for anyone 18 and older to request care online for non-urgent symptoms. For details visit [email or web address].   Also download the MyChart app! Go to the app store, search "MyChart", open the app, select [facility], and log in with your MyChart username and password.  Due to Covid, a mask is required upon entering the hospital/clinic. If you do not have a mask, one will be given to you upon arrival. For doctor visits, patients may have 1 support person aged 18 or older with them. For treatment visits, patients cannot have anyone with them due to current Covid guidelines and our immunocompromised population.    

## 2021-08-14 NOTE — Progress Notes (Signed)
Monica Mccoy Follow up:    Haydee Salter, MD [address] [place] [address]   DIAGNOSIS:  Mccoy Staging  No matching staging information was found for the patient.  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 BRCA gene testing was negative   08/06/2012 Surgery   Bilateral mastectomies:  Right breast: Invasive ductal carcinoma, 0.7cm, grade 2, with extensive DCIS, clear margins, 8/11 lymph nodes positive, measuring up to 3.8cm,  Left breast, no evidence of malignancy and 6 lymph nodes negative for carcinoma.    2014 - 2015 Chemotherapy   adjuvant chemotherapy with 4 cycles of dose dense Adriamycin and Cytoxan followed by 4 cycles of Taxol Herceptin and then Herceptin maintenance. She underwent right chest radiation.       12/24/2013 Relapse/Recurrence   Dizziness, headaches, ataxia, and memory loss. Head CT showed a 1.8cm and a 2.0cm frontal lobe masses. She underwent resection of both masses on 01/07/14,: Moderately differentiated carcinoma, likely of breast origin, HER-2 positive, ER/PR negative, CK7 positive.   2015 - 2015 Radiation Therapy   Received whole brain radiation    Chemotherapy   Taxotere, Herceptin and Perjeta, but Taxotere was discontinued after 4 cycles       11/21/2020 -  Chemotherapy   Patient is on Treatment Plan : BREAST Trastuzumab q21d       CURRENT THERAPY:  INTERVAL HISTORY: Monica Mccoy 55 y.o. female returns for evaluation prior to receiving Herceptin therapy.  Her most recent echocardiogram was completed on May 04, 2021.  This showed a left ventricular ejection fraction of 50 to 55% with normal GLS.  Clearance is doing moderately well today.  She notes no new concerns.   She continues on trastuzumab with good tolerance.  She continues to live with her Sister [name].  [name] is taking care of her and her mother.  She notes that today time he is at an appointment with her mother.   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  MEDICAL HISTORY: Past Medical History:  Diagnosis Date   Anxiety    Breast Mccoy metastasized to brain [facility])    Left breast   Chronic pain after Mccoy treatment    Depression    GERD (gastroesophageal reflux disease)    History of Mccoy chemotherapy    Breast Mccoy left breast   Seizure ([place])     SURGICAL HISTORY: Past Surgical History:  Procedure Laterality Date   BRAIN SURGERY     brain tumor   MASTECTOMY Bilateral    ORIF ANKLE FRACTURE Right     SOCIAL HISTORY: Social History   Socioeconomic History   Marital status: Divorced    Spouse name: Not on file   Number of children:  3   Years of education: Not on file   Highest education level: Not on file  Occupational History   Occupation: Disabled  Tobacco Use   Smoking status: Never   Smokeless tobacco: Never  Vaping Use   Vaping Use: Never used  Substance and Sexual Activity   Alcohol use: Yes    Comment: On occasion   Drug use: Yes    Types: Marijuana    Comment: Occasional use   Sexual activity: Not Currently  Other Topics Concern   Not on file  Social History Narrative    Right handed   Drinks caffeine   Two story home   Social Determinants of Health   Financial Resource Strain: Medium Risk   Difficulty of Paying Living Expenses: Somewhat hard  Food Insecurity: Food Insecurity Present   Worried About Charity fundraiser in the Last Year: Sometimes true   Ran Out of Food in the Last Year: Never true  Transportation Needs: No Transportation Needs   Lack of Transportation (Medical): No   Lack of Transportation (Non-Medical): No  Physical Activity: Insufficiently Active   Days of Exercise per Week: 1 day   Minutes of Exercise per Session: 30 min  Stress: No Stress Concern Present   Feeling of Stress : Only a little  Social Connections: Socially Isolated   Frequency of Communication with Friends and Family: More than three times a week   Frequency of Social Gatherings with Friends and Family: Once a week   Attends Religious Services: Never   Marine scientist or Organizations: No   Attends Music therapist: Never   Marital Status: Never married  Human resources officer Violence: Not At Risk   Fear of Current or Ex-Partner: No   Emotionally Abused: No   Physically Abused: No   Sexually Abused: No    FAMILY HISTORY: Family History  Problem Relation Age of Onset   Obesity Mother    Hypertension Mother    Hyperlipidemia Mother    Diabetes Mother    Heart disease Mother     Review of Systems  Constitutional:  Negative for appetite change, chills, fatigue, fever and unexpected weight change.  HENT:   Negative for hearing loss, lump/mass and trouble swallowing.   Eyes:  Negative for eye problems and icterus.  Respiratory:  Negative for chest tightness, cough and shortness of breath.   Cardiovascular:  Negative for chest pain, leg swelling and palpitations.  Gastrointestinal:  Negative for abdominal distention, abdominal pain, constipation, diarrhea, nausea and vomiting.  Endocrine: Negative for hot flashes.  Genitourinary:  Negative  for difficulty urinating.   Musculoskeletal:  Negative for arthralgias.  Skin:  Negative for itching and rash.  Neurological:  Positive for speech difficulty (Is related to history of brain metastasis and radiation). Negative for dizziness, extremity weakness, headaches and numbness.  Hematological:  Negative for adenopathy. Does not bruise/bleed easily.  Psychiatric/Behavioral:  Negative for depression. The patient is not nervous/anxious.      PHYSICAL EXAMINATION  ECOG PERFORMANCE STATUS: 1 - Symptomatic but completely ambulatory  Vitals:   08/14/21 1411  BP: (!) 123/93  Pulse: 82  Resp: 18  Temp: 97.6 F (36.4 C)  SpO2: 96%    Physical Exam Constitutional:      General: She is not in acute distress.    Appearance: Normal appearance. She is not toxic-appearing.  HENT:     Head: Normocephalic and atraumatic.  Eyes:     General: No scleral icterus. Cardiovascular:  Rate and Rhythm: Normal rate and regular rhythm.     Pulses: Normal pulses.     Heart sounds: Normal heart sounds.  Pulmonary:     Effort: Pulmonary effort is normal.     Breath sounds: Normal breath sounds.  Abdominal:     General: Abdomen is flat. Bowel sounds are normal. There is no distension.     Palpations: Abdomen is soft.     Tenderness: There is no abdominal tenderness.  Musculoskeletal:        General: No swelling.     Cervical back: Neck supple.  Lymphadenopathy:     Cervical: No cervical adenopathy.  Skin:    General: Skin is warm and dry.     Findings: No rash.  Neurological:     General: No focal deficit present.     Mental Status: She is alert.  Psychiatric:        Mood and Affect: Mood normal.        Behavior: Behavior normal.    LABORATORY DATA:  CBC    Component Value Date/Time   WBC 4.2 08/14/2021 1338   WBC 3.7 (L) 06/01/2021 1150   RBC 3.93 08/14/2021 1338   HGB 11.9 (L) 08/14/2021 1338   HCT 36.8 08/14/2021 1338   PLT 141 (L) 08/14/2021 1338   MCV 93.6 08/14/2021  1338   MCH 30.3 08/14/2021 1338   MCHC 32.3 08/14/2021 1338   RDW 13.7 08/14/2021 1338   LYMPHSABS 0.9 08/14/2021 1338   MONOABS 0.7 08/14/2021 1338   EOSABS 0.1 08/14/2021 1338   BASOSABS 0.0 08/14/2021 1338    CMP     Component Value Date/Time   NA 142 08/14/2021 1338   NA 139 12/15/2019 0000   K 3.8 08/14/2021 1338   CL 105 08/14/2021 1338   CO2 28 08/14/2021 1338   GLUCOSE 91 08/14/2021 1338   BUN 14 08/14/2021 1338   BUN 10 12/15/2019 0000   CREATININE 1.07 (H) 08/14/2021 1338   CALCIUM 8.5 (L) 08/14/2021 1338   PROT 7.0 08/14/2021 1338   ALBUMIN 3.3 (L) 08/14/2021 1338   AST 14 (L) 08/14/2021 1338   ALT 11 08/14/2021 1338   ALKPHOS 70 08/14/2021 1338   BILITOT 0.9 08/14/2021 1338   GFRNONAA >60 08/14/2021 1338      ASSESSMENT and THERAPY PLAN:   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 (2013 HER2 positive breast Mccoy status post bilateral mastectomies and adjuvant chemotherapy with Herceptin, April 2015 brain metastases status post resection and radiation)   Prior treatment: Patient was on Herceptin and Perjeta maintenance until June 2021 Because of insurance issues she has not had treatment for 7 months until she moved to [place].   1. Severe recurrent headaches: Could be related to prior brain radiation.  10/21/2020 brain MRI with and without contrast: No evidence of intracranial metastatic disease.  Postoperative changes left frontal encephalomalacia/gliosis 2. Severe bilateral chest discomfort without any palpable lesions: 10/20/2020: CT CAP: Post Op changes in Rt chest wall. Multilob uterus.  Whole-body scans will be done once a year.   Current treatment: Herceptin maintenance every 4 weeks started 11/21/2020, after the next scans in February 2023 we can decide on decreasing the frequency of infusions. Herceptin  toxicities: None    She continues to do well.  I am going to order her next echocardiogram.  We will continue  to see her with every treatment.   Orders Placed This Encounter  Procedures   ECHOCARDIOGRAM COMPLETE    Standing Status:   Future    Standing Expiration Date:   08/14/2022    Order Specific Question:   Where should this test be performed    Answer:   [facility]    Order Specific Question:   Perflutren DEFINITY (image enhancing agent) should be administered unless hypersensitivity or allergy exist    Answer:   Administer Perflutren    Order Specific Question:   Does this study need to be read by the Structural team/Level 3 readers?    Answer:   No    Order Specific Question:   Reason for exam-Echo    Answer:   Chemo  Z09    All questions were answered. The patient knows to call the clinic with any problems, questions or concerns. We can certainly see the patient much sooner if necessary.  Total encounter time: 30 minutes in face-to-face visit time, chart review, lab review, care coordination, order entry, and documentation of the the encounter.  Wilber Bihari, NP 08/15/21 10:08 AM Medical Oncology and Hematology [facility] [place], [place] [address] Tel. [phone]    Fax. [phone]  *Total Encounter Time as defined by the Centers for Medicare and Medicaid Services includes, in addition to the face-to-face time of a patient visit (documented in the note above) non-face-to-face time: obtaining and reviewing outside history, ordering and reviewing medications, tests or procedures, care coordination (communications with other health care professionals or caregivers) and documentation in the medical record.

## 2021-08-14 NOTE — Assessment & Plan Note (Addendum)
Moved to [facility] from [employer]), in 02/2020 to live with her parents and son near her sister. (2013 HER2 positive breast cancer status post bilateral mastectomies and adjuvant chemotherapy with Herceptin, April 2015 brain metastases status post resection and radiation)  Prior treatment: Patient was on Herceptin and Perjeta maintenance until June 2021 Because of insurance issues she has not had treatment for 7 months until she moved to [place].  1.Severe recurrent headaches: Could be related to prior brain radiation.2/25/2022brain MRI with and without contrast: No evidence of intracranial metastatic disease. Postoperative changes left frontal encephalomalacia/gliosis 2. Severe bilateral chest discomfort without any palpable lesions:10/20/2020: CT CAP: Post Op changes in Rt chest wall. Multilob uterus.  Whole-body scans will be done once a year.  Current treatment:Herceptin maintenance every 4 weeks started 11/21/2020, after the next scans in February 2023 we can decide on decreasing the frequency of infusions. Herceptin toxicities: None  She continues to do well.  I am going to order her next echocardiogram.  We will continue to see her with every treatment.

## 2021-08-15 ENCOUNTER — Encounter: Payer: Self-pay | Admitting: Hematology and Oncology

## 2021-08-15 ENCOUNTER — Other Ambulatory Visit: Payer: Self-pay | Admitting: *Deleted

## 2021-08-15 ENCOUNTER — Encounter: Payer: Self-pay | Admitting: Adult Health

## 2021-08-15 DIAGNOSIS — Z79899 Other long term (current) drug therapy: Secondary | ICD-10-CM

## 2021-08-15 DIAGNOSIS — Z5111 Encounter for antineoplastic chemotherapy: Secondary | ICD-10-CM

## 2021-08-15 DIAGNOSIS — C50911 Malignant neoplasm of unspecified site of right female breast: Secondary | ICD-10-CM

## 2021-08-15 DIAGNOSIS — Z5181 Encounter for therapeutic drug level monitoring: Secondary | ICD-10-CM

## 2021-09-04 ENCOUNTER — Ambulatory Visit: Payer: Medicare Other | Admitting: Family Medicine

## 2021-09-05 ENCOUNTER — Inpatient Hospital Stay (HOSPITAL_BASED_OUTPATIENT_CLINIC_OR_DEPARTMENT_OTHER): Payer: Medicare Other | Admitting: Hematology and Oncology

## 2021-09-05 ENCOUNTER — Inpatient Hospital Stay: Payer: Medicare Other | Attending: Internal Medicine

## 2021-09-05 ENCOUNTER — Other Ambulatory Visit: Payer: Self-pay

## 2021-09-05 ENCOUNTER — Encounter: Payer: Self-pay | Admitting: *Deleted

## 2021-09-05 ENCOUNTER — Inpatient Hospital Stay: Payer: Medicare Other

## 2021-09-05 DIAGNOSIS — Z9013 Acquired absence of bilateral breasts and nipples: Secondary | ICD-10-CM | POA: Insufficient documentation

## 2021-09-05 DIAGNOSIS — C7931 Secondary malignant neoplasm of brain: Secondary | ICD-10-CM

## 2021-09-05 DIAGNOSIS — C50911 Malignant neoplasm of unspecified site of right female breast: Secondary | ICD-10-CM

## 2021-09-05 DIAGNOSIS — Z171 Estrogen receptor negative status [ER-]: Secondary | ICD-10-CM | POA: Insufficient documentation

## 2021-09-05 DIAGNOSIS — Z79899 Other long term (current) drug therapy: Secondary | ICD-10-CM | POA: Diagnosis not present

## 2021-09-05 DIAGNOSIS — Z5112 Encounter for antineoplastic immunotherapy: Secondary | ICD-10-CM | POA: Diagnosis present

## 2021-09-05 DIAGNOSIS — Z95828 Presence of other vascular implants and grafts: Secondary | ICD-10-CM

## 2021-09-05 LAB — CBC WITH DIFFERENTIAL (CANCER CENTER ONLY)
Abs Immature Granulocytes: 0.01 10*3/uL (ref 0.00–0.07)
Basophils Absolute: 0 10*3/uL (ref 0.0–0.1)
Basophils Relative: 1 %
Eosinophils Absolute: 0.1 10*3/uL (ref 0.0–0.5)
Eosinophils Relative: 2 %
HCT: 35.6 % — ABNORMAL LOW (ref 36.0–46.0)
Hemoglobin: 11.5 g/dL — ABNORMAL LOW (ref 12.0–15.0)
Immature Granulocytes: 0 %
Lymphocytes Relative: 23 %
Lymphs Abs: 1 10*3/uL (ref 0.7–4.0)
MCH: 30.3 pg (ref 26.0–34.0)
MCHC: 32.3 g/dL (ref 30.0–36.0)
MCV: 93.9 fL (ref 80.0–100.0)
Monocytes Absolute: 0.7 10*3/uL (ref 0.1–1.0)
Monocytes Relative: 16 %
Neutro Abs: 2.6 10*3/uL (ref 1.7–7.7)
Neutrophils Relative %: 58 %
Platelet Count: 182 10*3/uL (ref 150–400)
RBC: 3.79 MIL/uL — ABNORMAL LOW (ref 3.87–5.11)
RDW: 13.2 % (ref 11.5–15.5)
WBC Count: 4.5 10*3/uL (ref 4.0–10.5)
nRBC: 0 % (ref 0.0–0.2)

## 2021-09-05 LAB — CMP (CANCER CENTER ONLY)
ALT: 10 U/L (ref 0–44)
AST: 10 U/L — ABNORMAL LOW (ref 15–41)
Albumin: 3.7 g/dL (ref 3.5–5.0)
Alkaline Phosphatase: 70 U/L (ref 38–126)
Anion gap: 6 (ref 5–15)
BUN: 7 mg/dL (ref 6–20)
CO2: 30 mmol/L (ref 22–32)
Calcium: 8.9 mg/dL (ref 8.9–10.3)
Chloride: 104 mmol/L (ref 98–111)
Creatinine: 0.81 mg/dL (ref 0.44–1.00)
GFR, Estimated: >60 mL/min (ref >60)
Glucose, Bld: 114 mg/dL — ABNORMAL HIGH (ref 70–99)
Potassium: 3.9 mmol/L (ref 3.5–5.1)
Sodium: 140 mmol/L (ref 135–145)
Total Bilirubin: 0.7 mg/dL (ref 0.3–1.2)
Total Protein: 7.1 g/dL (ref 6.5–8.1)

## 2021-09-05 MED ORDER — TRASTUZUMAB-DKST CHEMO 150 MG IV SOLR
750.0000 mg | Freq: Once | INTRAVENOUS | Status: AC
  Administered 2021-09-05: 750 mg via INTRAVENOUS
  Filled 2021-09-05: qty 35.72

## 2021-09-05 MED ORDER — ACETAMINOPHEN 325 MG PO TABS
650.0000 mg | ORAL_TABLET | Freq: Once | ORAL | Status: AC
  Administered 2021-09-05: 650 mg via ORAL

## 2021-09-05 MED ORDER — DIPHENHYDRAMINE HCL 25 MG PO CAPS
50.0000 mg | ORAL_CAPSULE | Freq: Once | ORAL | Status: AC
  Administered 2021-09-05: 50 mg via ORAL

## 2021-09-05 MED ORDER — SODIUM CHLORIDE 0.9% FLUSH
10.0000 mL | INTRAVENOUS | Status: DC | PRN
  Administered 2021-09-05: 10 mL

## 2021-09-05 MED ORDER — SODIUM CHLORIDE 0.9 % IV SOLN: Freq: Once | INTRAVENOUS | Status: AC

## 2021-09-05 NOTE — Assessment & Plan Note (Signed)
Moved to [facility] from [employer]), in 02/2020 to live with her parents and son near her sister. (2013 HER2 positive breast cancer status post bilateral mastectomies and adjuvant chemotherapy with Herceptin, April 2015 brain metastases status post resection and radiation)  Prior treatment: Patient was on Herceptin and Perjeta maintenance until June 2021 Because of insurance issues she has not had treatmentfor7 monthsuntil she moved to [place].  1.Severe recurrent headaches: Could be related to prior brain radiation.2/25/2022brain MRI with and without contrast: No evidence of intracranial metastatic disease. Postoperative changes left frontal encephalomalacia/gliosis 2. Severe bilateral chest discomfort without any palpable lesions:10/20/2020: CT CAP: Post Op changes in Rt chest wall. Multilob uterus.    Current treatment:Herceptin maintenance every 4 weeks started 11/21/2020, after the next scans in February 2023 we can decide on increasing the frequency of infusions. Herceptin toxicities: None  Joint pains/chest wall pains: We will increase her tramadol dosage today.   After a year of monthly Herceptin as we can switch to every 6 weeks thereafter.

## 2021-09-05 NOTE — Progress Notes (Signed)
Patient Care Team: Haydee Salter, MD as PCP - General (Family Medicine) Nicholas Lose, MD as Consulting Physician (Hematology and Oncology) Cameron Sprang, MD as Consulting Physician (Neurology)  DIAGNOSIS:  Encounter Diagnosis  Name Primary?   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 BRCA gene testing was negative   08/06/2012 Surgery   Bilateral mastectomies:  Right breast: Invasive ductal carcinoma, 0.7cm, grade 2, with extensive DCIS, clear margins, 8/11 lymph nodes positive, measuring up to 3.8cm,  Left breast, no evidence of malignancy and 6 lymph nodes negative for carcinoma.    2014 - 2015 Chemotherapy   adjuvant chemotherapy with 4 cycles of dose dense Adriamycin and Cytoxan followed by 4 cycles of Taxol Herceptin and then Herceptin maintenance. She underwent right chest radiation.       12/24/2013 Relapse/Recurrence   Dizziness, headaches, ataxia, and memory loss. Head CT showed a 1.8cm and a 2.0cm frontal lobe masses. She underwent resection of both masses on 01/07/14,: Moderately differentiated carcinoma, likely of breast origin, HER-2 positive, ER/PR negative, CK7 positive.   2015 - 2015 Radiation Therapy   Received whole brain radiation    Chemotherapy   Taxotere, Herceptin and Perjeta, but Taxotere was discontinued after 4 cycles       11/21/2020 -  Chemotherapy   Patient is on Treatment Plan : BREAST Trastuzumab q21d       CHIEF COMPLIANT: Follow-up on Herceptin therapy  INTERVAL HISTORY: Monica Mccoy is a 56 year old with above-mentioned [name] metastatic breast cancer who is currently on Herceptin maintenance therapy.  She is tolerating the treatment fairly well.   However at home she has fallen a few times.  Most of this time she falls because she gets up very quickly and tries to get back on her feet.  If she takes a few minutes between getting up and getting to walk she would do significantly better.  She is also been slightly more confused according to the family.   ALLERGIES:  is allergic to dilaudid [hydromorphone hcl], morphine and related, penicillins, and tramadol.  MEDICATIONS:  Current Outpatient Medications  Medication Sig Dispense Refill   acetaminophen (TYLENOL) 500 MG tablet Take 1,000 mg by mouth every 6 (six) hours as needed.     amLODipine (NORVASC) 5 MG tablet Take 1 tablet (5 mg total) by mouth daily. 30 tablet 3   divalproex (DEPAKOTE) 500 MG DR tablet Take 1 tablet (500 mg total) by mouth 2 (two) times daily. 60 tablet 3   DULoxetine (CYMBALTA) 60 MG capsule Take 1 capsule (60 mg total) by mouth daily. 90 capsule 1   gabapentin (NEURONTIN) 300 MG capsule Take 1 cap in AM, 1 cap in afternoon, 2 caps at bedtime 120 capsule 11   MELATONIN GUMMIES PO Take 10 mg by mouth.     mirabegron ER (MYRBETRIQ) 50 MG TB24 tablet Take 1 tablet (50 mg total) by mouth daily. 90 tablet 1   traMADol (ULTRAM) 50 MG tablet Take 2 tablets (100 mg total) by mouth 2 (two) times daily as needed. 60 tablet 0   No current facility-administered medications for this visit.    PHYSICAL EXAMINATION: ECOG PERFORMANCE STATUS: 1 - Symptomatic but completely ambulatory  Vitals:  09/05/21 1451  BP: (!) 165/86  Pulse: 66  Resp: 18  Temp: 97.8 F (36.6 C)  SpO2: 98%   Filed Weights   09/05/21 1451  Weight: 288 lb 9.6 oz (130.9 kg)     LABORATORY DATA:  I have reviewed the data as listed CMP Latest Ref Rng & Units 08/14/2021 07/03/2021 06/05/2021  Glucose 70 - 99 mg/dL 91 85 94  BUN 6 - 20 mg/dL 14 21(H) 20  Creatinine 0.44 - 1.00 mg/dL 1.07(H) 0.84 1.06(H)  Sodium 135 - 145 mmol/L 142 143 142  Potassium 3.5 - 5.1 mmol/L 3.8 3.8 3.7  Chloride 98 -  111 mmol/L 105 107 104  CO2 22 - 32 mmol/L $RemoveB'28 30 29  'RfehYszi$ Calcium 8.9 - 10.3 mg/dL 8.5(L) 8.7(L) 8.7(L)  Total Protein 6.5 - 8.1 g/dL 7.0 6.6 6.9  Total Bilirubin 0.3 - 1.2 mg/dL 0.9 0.5 0.3  Alkaline Phos 38 - 126 U/L 70 63 56  AST 15 - 41 U/L 14(L) 23 17  ALT 0 - 44 U/L $Remo'11 27 15    'oSNCT$ Lab Results  Component Value Date   WBC 4.5 09/05/2021   HGB 11.5 (L) 09/05/2021   HCT 35.6 (L) 09/05/2021   MCV 93.9 09/05/2021   PLT 182 09/05/2021   NEUTROABS 2.6 09/05/2021    ASSESSMENT & PLAN:  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[place]) Moved to [name] from [place], [facility]), in 02/2020 to live with her parents and son near her sister. (2013 HER2 positive breast cancer status post bilateral mastectomies and adjuvant chemotherapy with Herceptin, April 2015 brain metastases status post resection and radiation)   Prior treatment: Patient was on Herceptin and Perjeta maintenance until June 2021 Because of insurance issues she has not had treatment for 7 months until she moved to [place].   1. Severe recurrent headaches:    10/20/2020: CT CAP: Post Op changes in Rt chest wall. Multilob uterus.     Current treatment: Herceptin maintenance every 4 weeks started 11/21/2020,  Herceptin toxicities: None   Joint pains/chest wall pains: On Tramadol   CT chest abdomen pelvis and brain MRI will be ordered Brain MRI is being ordered because she is having confusion as well as frequent falls. The scans been in 2 weeks. Follow-up in 3 weeks for Herceptin and to review the results of the scans.   No orders of the defined types were placed in this encounter.  The patient has a good understanding of the overall plan. she agrees with it. she will call with any problems that may develop before the next visit here. Total time spent: 30 mins including face to face time and time spent for planning, charting and co-ordination of care   Harriette Ohara, MD 09/05/21

## 2021-09-05 NOTE — Progress Notes (Signed)
Per MD okay to treat today with Echo from 05/04/21.  Pt currently scheduled 09/14/21 for next echo.

## 2021-09-05 NOTE — Patient Instructions (Signed)
[facility] MEDICAL ONCOLOGY   Discharge Instructions: Thank you for choosing [facility] to provide your oncology and hematology care.   If you have a lab appointment with the Cancer Center, please go directly to the Cancer Center and check in at the registration area.   Wear comfortable clothing and clothing appropriate for easy access to any Portacath or PICC line.   We strive to give you quality time with your provider. You may need to reschedule your appointment if you arrive late (15 or more minutes).  Arriving late affects you and other patients whose appointments are after yours.  Also, if you miss three or more appointments without notifying the office, you may be dismissed from the clinic at the provider's discretion.      For prescription refill requests, have your pharmacy contact our office and allow 72 hours for refills to be completed.    Today you received the following chemotherapy and/or immunotherapy agents: trastuzumab-dkst.      To help prevent nausea and vomiting after your treatment, we encourage you to take your nausea medication as directed.  BELOW ARE SYMPTOMS THAT SHOULD BE REPORTED IMMEDIATELY: *FEVER GREATER THAN 100.4 F (38 C) OR HIGHER *CHILLS OR SWEATING *NAUSEA AND VOMITING THAT IS NOT CONTROLLED WITH YOUR NAUSEA MEDICATION *UNUSUAL SHORTNESS OF BREATH *UNUSUAL BRUISING OR BLEEDING *URINARY PROBLEMS (pain or burning when urinating, or frequent urination) *BOWEL PROBLEMS (unusual diarrhea, constipation, pain near the anus) TENDERNESS IN MOUTH AND THROAT WITH OR WITHOUT PRESENCE OF ULCERS (sore throat, sores in mouth, or a toothache) UNUSUAL RASH, SWELLING OR PAIN  UNUSUAL VAGINAL DISCHARGE OR ITCHING   Items with * indicate a potential emergency and should be followed up as soon as possible or go to the Emergency Department if any problems should occur.  Please show the CHEMOTHERAPY ALERT CARD or IMMUNOTHERAPY ALERT CARD at  check-in to the Emergency Department and triage nurse.  Should you have questions after your visit or need to cancel or reschedule your appointment, please contact [facility] MEDICAL ONCOLOG[facility]  Dept: [phone]  and follow the prompts.  Office hours are 8:00 a.m. to 4:30 p.m. Monday - Friday. Please note that voicemails left after 4:00 p.m. may not be returned until the following business day.  We are closed weekends and major holidays. You have access to a nurse at all times for urgent questions. Please call the main number to the clinic Dept: [phone] and follow the prompts.   For any non-urgent questions, you may also contact your provider using MyChart. We now offer e-Visits for anyone 18 and older to request care online for non-urgent symptoms. For details visit [email or web address].   Also download the MyChart app! Go to the app store, search "MyChart", open the app, select [facility], and log in with your MyChart username and password.  Due to Covid, a mask is required upon entering the hospital/clinic. If you do not have a mask, one will be given to you upon arrival. For doctor visits, patients may have 1 support person aged 18 or older with them. For treatment visits, patients cannot have anyone with them due to current Covid guidelines and our immunocompromised population.    

## 2021-09-08 ENCOUNTER — Other Ambulatory Visit: Payer: Self-pay

## 2021-09-11 ENCOUNTER — Ambulatory Visit (INDEPENDENT_AMBULATORY_CARE_PROVIDER_SITE_OTHER): Payer: Medicare Other | Admitting: Family Medicine

## 2021-09-11 ENCOUNTER — Other Ambulatory Visit: Payer: Self-pay

## 2021-09-11 VITALS — BP 120/82 | HR 72 | Temp 96.5°F | Ht 62.0 in | Wt 293.8 lb

## 2021-09-11 DIAGNOSIS — N39490 Overflow incontinence: Secondary | ICD-10-CM

## 2021-09-11 DIAGNOSIS — R600 Localized edema: Secondary | ICD-10-CM

## 2021-09-11 DIAGNOSIS — C50911 Malignant neoplasm of unspecified site of right female breast: Secondary | ICD-10-CM | POA: Diagnosis not present

## 2021-09-11 DIAGNOSIS — I10 Essential (primary) hypertension: Secondary | ICD-10-CM | POA: Diagnosis not present

## 2021-09-11 DIAGNOSIS — C7931 Secondary malignant neoplasm of brain: Secondary | ICD-10-CM

## 2021-09-11 DIAGNOSIS — F331 Major depressive disorder, recurrent, moderate: Secondary | ICD-10-CM | POA: Diagnosis not present

## 2021-09-11 DIAGNOSIS — T451X5A Adverse effect of antineoplastic and immunosuppressive drugs, initial encounter: Secondary | ICD-10-CM | POA: Insufficient documentation

## 2021-09-11 DIAGNOSIS — G620 Drug-induced polyneuropathy: Secondary | ICD-10-CM | POA: Insufficient documentation

## 2021-09-11 DIAGNOSIS — G40209 Localization-related (focal) (partial) symptomatic epilepsy and epileptic syndromes with complex partial seizures, not intractable, without status epilepticus: Secondary | ICD-10-CM | POA: Insufficient documentation

## 2021-09-11 MED ORDER — FUROSEMIDE 20 MG PO TABS: 20.0000 mg | ORAL_TABLET | Freq: Every day | ORAL | 3 refills | Status: DC

## 2021-09-11 NOTE — Progress Notes (Signed)
[facility] [address] [place] [address] Dept: [phone] Dept Fax: [phone]  Chronic Care Office Visit  Subjective:    Patient ID: Monica Mccoy, female    DOB: 1966/04/19, 56 y.o..   MRN: [id number]  Chief Complaint  Patient presents with   Follow-up    3 month f/u.  C/o HA's and pain in the upper abdomen. Confusion.      History of Present Illness:  Patient is in today for reassessment of chronic medical issues.  Ms. 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 BRCA gene testing was negative. This was diagnosed in Oct. 2013. She underwent bilateral mastectomies in Dec. 2013. This was followed with chemotherapy and radiation. In April 2015, she was found to have two frontal lobe tumors. She underwent resection, again followed with radiation and chemotherapy. She is currently on Herceptin maintenance therapy under the care of Dr. Lindi Adie.   Ms. Calzada has a history of recurrent headache and increasing confusion. Dr. Lindi Adie has requested an MRI of the brain to evaluate for any potential recurrence of her brain metastasis. The scan is pending scheduling.   Ms. Avakian has a history of seizures associated with her prior brain tumors/craniotomy. She is managed on Depakote and has not had recent seizure activity.   Ms. Panjwani is being managed on Duloxetine related to chronic depression but also chronic pain. Her pain invovles headaches, neck pain, and chest pain. She is also on gabapentin and tramadol for these. These have been stable.   Ms. Caradonna has a history of urinary incontinence. She is currently manage on mirabegron (Myrbetriq). Despite this, she is frequently incontinent of urine. She has been seen by urology. One of the issues appears to be that she will stay in bed for long periods without getting up  to the restroom.   Ms. Laroche has a history of pedal edema. She has noted increasing edema of the legs over the past few months.  Past Medical History: Patient Active Problem List   Diagnosis Date Noted   Chemotherapy-induced neuropathy ([place]) 09/11/2021   Localization-related (focal) (partial) symptomatic epilepsy and epileptic syndromes with complex partial seizures, not intractable, without status epilepticus ([place]) 09/11/2021   Essential hypertension 05/30/2021   Acute encephalopathy 05/19/2021   Port-A-Cath in place 01/16/2021   Migraine without aura 12/19/2020   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[place]) 09/22/2020   Hx of breast cancer 06/29/2020   Gastroesophageal reflux disease without esophagitis 06/29/2020   Other chronic pain 06/29/2020   Moderate episode of recurrent major depressive disorder ([place]) 06/29/2020   Urinary incontinence 06/29/2020   Past Surgical History:  Procedure Laterality Date   BRAIN SURGERY     brain tumor   MASTECTOMY Bilateral    ORIF ANKLE FRACTURE Right    Family History  Problem Relation Age of Onset   Obesity Mother    Hypertension Mother    Hyperlipidemia Mother    Diabetes Mother    Heart disease Mother    Outpatient Medications Prior to Visit  Medication Sig Dispense Refill   acetaminophen (TYLENOL) 500 MG tablet Take 1,000 mg by mouth every 6 (six) hours as needed.     amLODipine (NORVASC) 5 MG tablet Take 1 tablet (5 mg total) by mouth daily. 30 tablet 3   divalproex (DEPAKOTE) 500 MG DR tablet Take 1 tablet (500 mg  total) by mouth 2 (two) times daily. 60 tablet 3   DULoxetine (CYMBALTA) 60 MG capsule Take 1 capsule (60 mg total) by mouth daily. 90 capsule 1   gabapentin (NEURONTIN) 300 MG capsule Take 1 cap in AM, 1 cap in afternoon, 2 caps at bedtime 120 capsule 11   MELATONIN GUMMIES PO Take 10 mg by mouth.     mirabegron ER (MYRBETRIQ) 50 MG TB24 tablet Take 1 tablet (50 mg total) by mouth daily. 90 tablet 1   traMADol  (ULTRAM) 50 MG tablet Take 2 tablets (100 mg total) by mouth 2 (two) times daily as needed. 60 tablet 0   No facility-administered medications prior to visit.   Allergies  Allergen Reactions   Dilaudid [Hydromorphone Hcl] Other (See Comments)    Confusion & hallucinations   Morphine And Related Other (See Comments)    HA   Penicillins    Tramadol      Objective:   Today's Vitals   09/11/21 1110  BP: 120/82  Pulse: 72  Temp: (!) 96.5 F (35.8 C)  TempSrc: Temporal  SpO2: 98%  Weight: 293 lb 12.8 oz (133.3 kg)  Height: _0  (1.575 m)   Body mass index is 53.74 kg/m.   General: Well developed, well nourished. No acute distress. HEENT: Normocephalic, non-traumatic. External ears normal. EAC and TMs normal bilaterally. PERRL, EOMI. Conjunctiva clear. Fundiscopic exam shows normal disc and vasculature.Nose   clear without congestion or rhinorrhea. Mucous membranes moist. Oropharynx clear. Good dentition. Neck: Supple. No lymphadenopathy. No thyromegaly. Lungs: Clear to auscultation bilaterally. No wheezing, rales or rhonchi. CV: RRR without murmurs or rubs. Pulses 2+ bilaterally. Abdomen: Soft, non-tender. Bowel sounds positive, normal pitch and frequency. No hepatosplenomegaly. No rebound or guarding. Back: Straight. No CVA tenderness bilaterally. Extremities: Full ROM. No joint swelling or tenderness. No edema noted. Skin: Warm and dry. No rashes. Neuro:CN II-XII intact. Normal sensation and DTR bilaterally. Psych: Alert and oriented x3. Normal mood and affect.  Health Maintenance Due  Topic Date Due   Pneumococcal Vaccine 41-32 Years old (1 - PCV) Never done   Hepatitis C Screening  Never done   TETANUS/TDAP  Never done   Zoster Vaccines- Shingrix (1 of 2) Never done   PAP SMEAR-Modifier  Never done   COLONOSCOPY (Pts 45-90yr Insurance coverage will need to be confirmed)  Never done   MAMMOGRAM  Never done   COVID-19 Vaccine (3 - Moderna risk series) 02/16/2020      Assessment & Plan:   1. 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[facility] Currently being managed on Herceptin. Dr. GLindi Adiehas ordered a brain MRI. If this does not show progressive disease, I would consider a referral to neurology to assess headaches and mental confusion.  2. Essential hypertension Blood pressure at goal. Continue amlodipine.  3. Pedal edema I will add a diuretic to reduce the lower leg edema. I will plan to see her back in 1 month to reassess.  - furosemide (LASIX) 20 MG tablet; Take 1 tablet (20 mg total) by mouth daily.  Dispense: 30 tablet; Refill: 3  4. Moderate episode of recurrent major depressive disorder (HCC) Stable on duloxetine.  5. Overflow incontinence of urine We discussed the need to urinate at least every few hours while awake during the day. The diuretic will increase the importance of this. She can continue her mirabegron.  6. Localization-related (focal) (partial) symptomatic epilepsy and epileptic syndromes with complex partial seizures, not intractable, without status epilepticus ([place] No  recent seizure activity. Continue Depakote.  Haydee Salter, MD

## 2021-09-14 ENCOUNTER — Other Ambulatory Visit: Payer: Self-pay

## 2021-09-14 ENCOUNTER — Ambulatory Visit (HOSPITAL_COMMUNITY)
Admission: RE | Admit: 2021-09-14 | Discharge: 2021-09-14 | Disposition: A | Discharge status: Home / Self Care | Payer: Medicare Other | Source: Ambulatory Visit | Attending: Adult Health | Admitting: Adult Health

## 2021-09-14 DIAGNOSIS — Z79899 Other long term (current) drug therapy: Secondary | ICD-10-CM | POA: Insufficient documentation

## 2021-09-14 DIAGNOSIS — C7931 Secondary malignant neoplasm of brain: Secondary | ICD-10-CM | POA: Diagnosis not present

## 2021-09-14 DIAGNOSIS — Z5181 Encounter for therapeutic drug level monitoring: Secondary | ICD-10-CM | POA: Diagnosis present

## 2021-09-14 DIAGNOSIS — Z0189 Encounter for other specified special examinations: Secondary | ICD-10-CM

## 2021-09-14 DIAGNOSIS — Z5111 Encounter for antineoplastic chemotherapy: Secondary | ICD-10-CM

## 2021-09-14 DIAGNOSIS — C50911 Malignant neoplasm of unspecified site of right female breast: Secondary | ICD-10-CM | POA: Insufficient documentation

## 2021-09-14 LAB — ECHOCARDIOGRAM COMPLETE
AR max vel: 2.05 cm2
AV Area VTI: 2.32 cm2
AV Area mean vel: 2.06 cm2
AV Mean grad: 7 mmHg
AV Peak grad: 12.4 mmHg
Ao pk vel: 1.76 m/s
Area-P 1/2: 3.7 cm2
Calc EF: 52 %
S' Lateral: 3.9 cm
Single Plane A2C EF: 44.4 %
Single Plane A4C EF: 59.7 %

## 2021-09-14 NOTE — Progress Notes (Signed)
° °  Echocardiogram 2D Echocardiogram has been performed.  Monica Mccoy 09/14/2021, 11:15 AM

## 2021-09-18 ENCOUNTER — Telehealth: Payer: Self-pay | Admitting: Hematology and Oncology

## 2021-09-18 NOTE — Telephone Encounter (Signed)
Sch per 1/22 inbasket, spoke with pt sister, pt sister aware

## 2021-09-19 ENCOUNTER — Other Ambulatory Visit: Payer: Self-pay | Admitting: Radiation Therapy

## 2021-09-22 ENCOUNTER — Encounter: Payer: Self-pay | Admitting: Hematology and Oncology

## 2021-09-25 ENCOUNTER — Inpatient Hospital Stay: Payer: Medicare Other | Admitting: Internal Medicine

## 2021-09-26 ENCOUNTER — Ambulatory Visit (HOSPITAL_COMMUNITY)
Admission: RE | Admit: 2021-09-26 | Discharge: 2021-09-26 | Disposition: A | Discharge status: Home / Self Care | Payer: Medicare Other | Source: Ambulatory Visit | Attending: Hematology and Oncology | Admitting: Hematology and Oncology

## 2021-09-26 ENCOUNTER — Other Ambulatory Visit: Payer: Self-pay

## 2021-09-26 ENCOUNTER — Encounter (HOSPITAL_COMMUNITY): Payer: Self-pay

## 2021-09-26 DIAGNOSIS — C50911 Malignant neoplasm of unspecified site of right female breast: Secondary | ICD-10-CM | POA: Insufficient documentation

## 2021-09-26 DIAGNOSIS — C7931 Secondary malignant neoplasm of brain: Secondary | ICD-10-CM | POA: Diagnosis present

## 2021-09-26 IMAGING — MR MR HEAD WO/W CM
14 series · 48 of 48 positions shown · IV contrast (gadavist)
Comparison: Head MRI [DATE]

CLINICAL DATA: Mental status change. Falls and confusion. History
of metastatic breast cancer.

EXAM:
MRI HEAD WITHOUT AND WITH CONTRAST
TECHNIQUE: Multiplanar, multiecho pulse sequences of the brain and surrounding
structures were obtained without and with intravenous contrast.
CONTRAST:  10mL GADAVIST GADOBUTROL 1 MMOL/ML IV SOLN

[Series 5: DWI · axial · 3.0mm · 1.36mm/px · z∈[-67,+86]mm · 6 of 104 slices shown (1 of 2)]
[im 1/104]
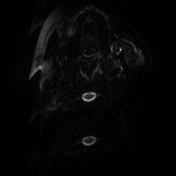
[im 21/104]
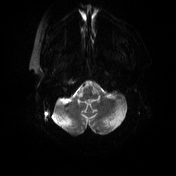
[im 42/104]
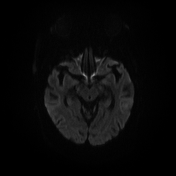
[im 62/104]
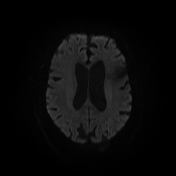
[im 83/104]
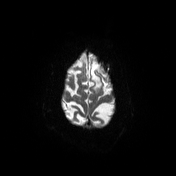
[im 104/104]
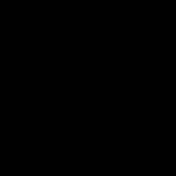

[Series 6: DWI · axial · 3.0mm · 1.36mm/px · z∈[-67,+74]mm · 3 of 48 slices shown (2 of 2)]
[im 1/48]
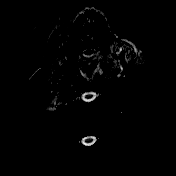
[im 24/48]
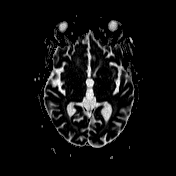
[im 48/48]
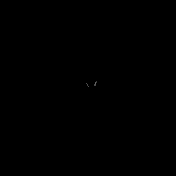

[Series 7: T1 · sagittal · 5.0mm · 0.75mm/px · 1 of 24 slices shown (1 of 2)]
[im 1/24]
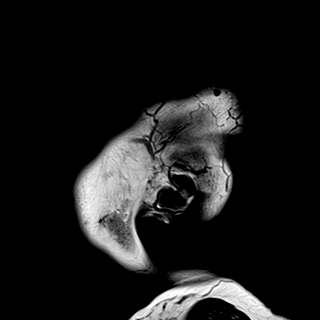

[Series 8: T2 · axial · 5.0mm · 0.62mm/px · 1 of 25 slices shown]
[im 1/25]
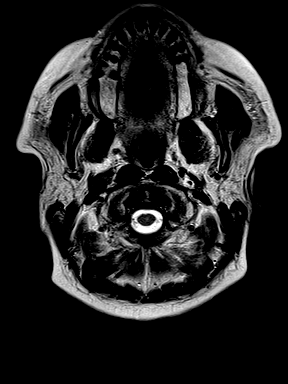

[Series 9: swi_images · axial · 3.0mm · 0.75mm/px · z∈[-72,+93]mm · 3 of 56 slices shown]
[im 1/56]
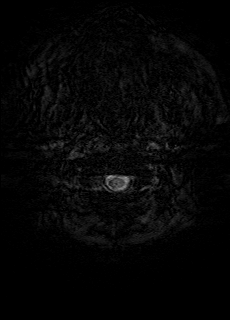
[im 28/56]
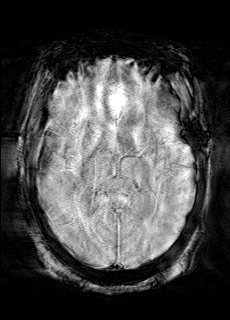
[im 56/56]
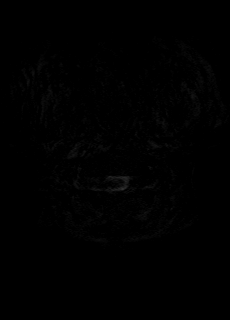

[Series 11: FLAIR · axial · 3.0mm · 0.75mm/px · z∈[-66,+87]mm · 3 of 52 slices shown]
[im 1/52]
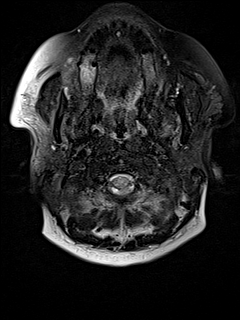
[im 26/52]
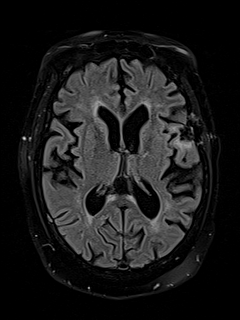
[im 52/52]
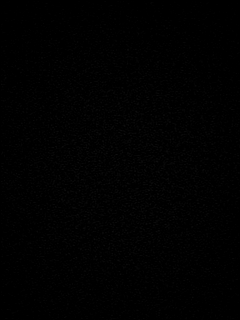

[Series 12: T1 · axial · 1.0mm · 0.94mm/px · z∈[-69,+90]mm · 9 of 160 slices shown (2 of 2)]
[im 1/160]
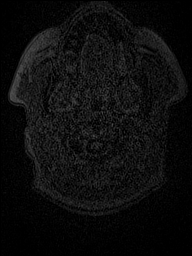
[im 20/160]
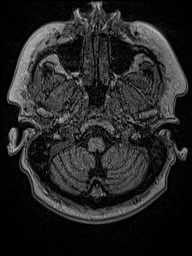
[im 40/160]
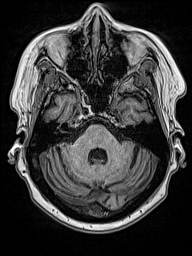
[im 60/160]
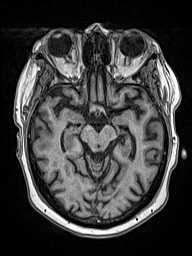
[im 80/160]
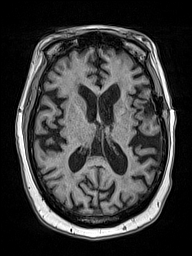
[im 100/160]
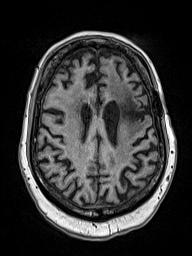
[im 120/160]
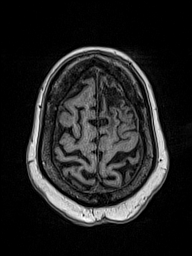
[im 140/160]
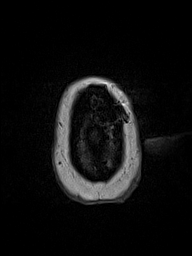
[im 160/160]
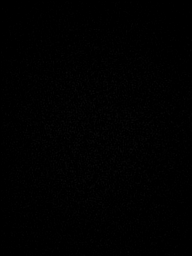

[Series 13: cor dwi_tracew · coronal · 5.0mm · 1.53mm/px · 3 of 58 slices shown]
[im 1/58]
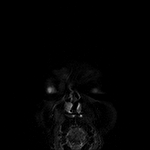
[im 29/58]
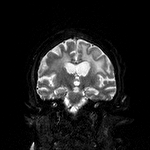
[im 58/58]
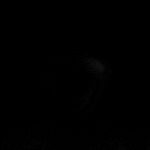

[Series 14: cor dwi_adc · coronal · 5.0mm · 1.53mm/px · 2 of 29 slices shown]
[im 1/29]
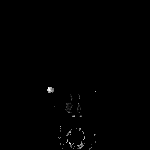
[im 29/29]
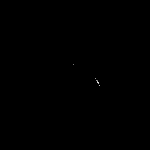

[Series 15: GRE · axial · 3.0mm · 0.45mm/px · z∈[-64,+86]mm · 3 of 51 slices shown]
[im 1/51]
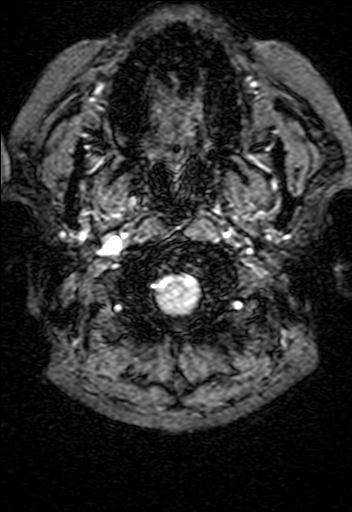
[im 26/51]
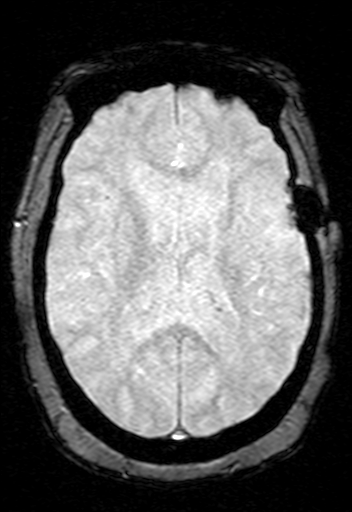
[im 51/51]
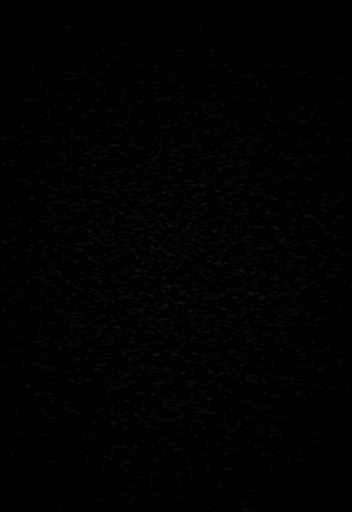

[Series 16: T2 post-contrast · coronal · 5.0mm · 0.57mm/px · 2 of 32 slices shown]
[im 1/32]
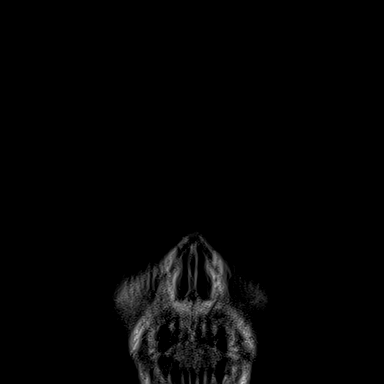
[im 32/32]
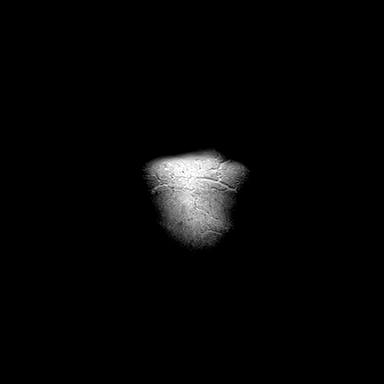

[Series 17: T1 post-contrast · axial · 1.0mm · 0.94mm/px · z∈[-69,+90]mm · 9 of 160 slices shown (1 of 3)]
[im 1/160]
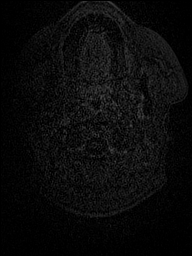
[im 20/160]
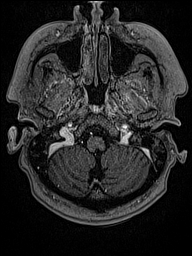
[im 40/160]
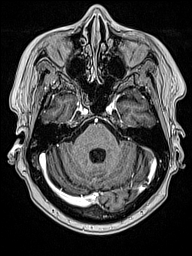
[im 60/160]
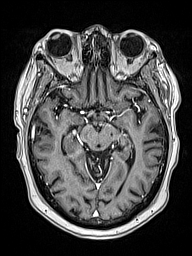
[im 80/160]
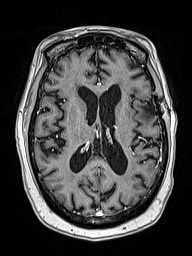
[im 100/160]
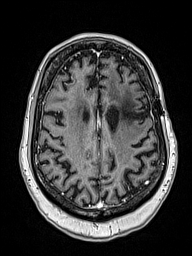
[im 120/160]
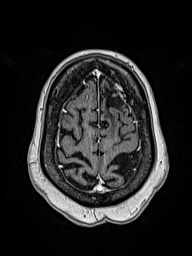
[im 140/160]
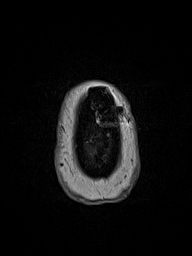
[im 160/160]
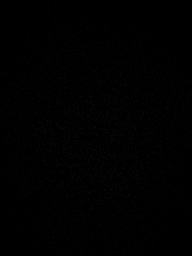

[Series 18: T1 post-contrast · coronal · 5.0mm · 0.43mm/px · 2 of 32 slices shown (2 of 3)]
[im 1/32]
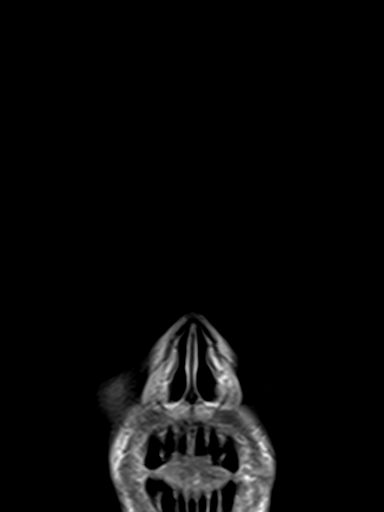
[im 32/32]
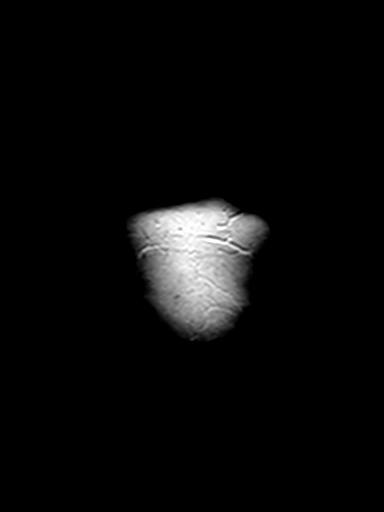

[Series 19: T1 post-contrast · sagittal · 5.0mm · 0.75mm/px · 1 of 24 slices shown (3 of 3)]
[im 1/24]
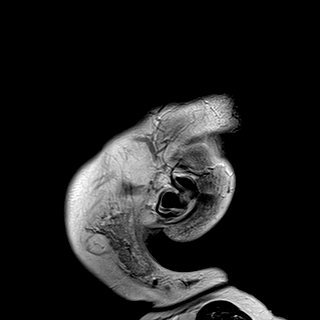

[48 of 48 positions shown; findings below may reference images not displayed]

FINDINGS: Brain: There is no evidence of an acute infarct, mass, midline
shift, or extra-axial fluid collection. Postoperative
encephalomalacia and gliosis in the left frontal lobe are unchanged.
Confluent T2 hyperintensities elsewhere in the cerebral white matter
bilaterally are also similar to the prior MRI and nonspecific but
likely reflect post treatment changes. There is mild ex vacuo
dilatation of the left lateral ventricle. A few scattered chronic
cerebral and cerebellar microhemorrhages are again noted including a
new chronic microhemorrhage inferiorly in the left occipital lobe.
No abnormal enhancement is identified. There is mild cerebral
atrophy.

Vascular: Major intracranial vascular flow voids are preserved.

Skull and upper cervical spine: Left frontal craniotomy. No
suspicious marrow lesion.

Sinuses/Orbits: Left cataract extraction. Clear paranasal sinuses.
Small right and moderate left mastoid effusions.

Other: None.
IMPRESSION: 1. No acute intracranial abnormality or evidence of intracranial
metastases.
2. Post treatment changes as above.

## 2021-09-26 IMAGING — CT CT CHEST-ABD-PELV W/ CM
2 of 5 series · 14 of 36 positions shown, 16 images · IV contrast (OMNIPAQUE)
Comparison: [DATE]

CLINICAL DATA: Stage IV breast cancer.  Restaging.

EXAM:
CT CHEST, ABDOMEN, AND PELVIS WITH CONTRAST
TECHNIQUE: Multidetector CT imaging of the chest, abdomen and pelvis was
performed following the standard protocol during bolus
administration of intravenous contrast.

[Series 2: cap with · axial · 0.86mm/px · z∈[-645,-115]mm · 11 of 128 slices shown, 13 images]
[im 11/128  mediastinal]
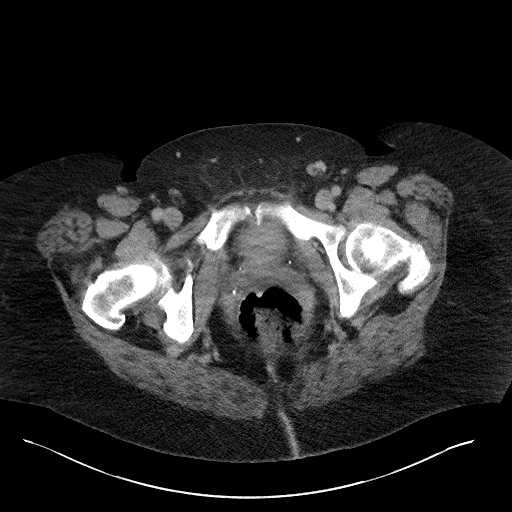
[im 11/128  bone]
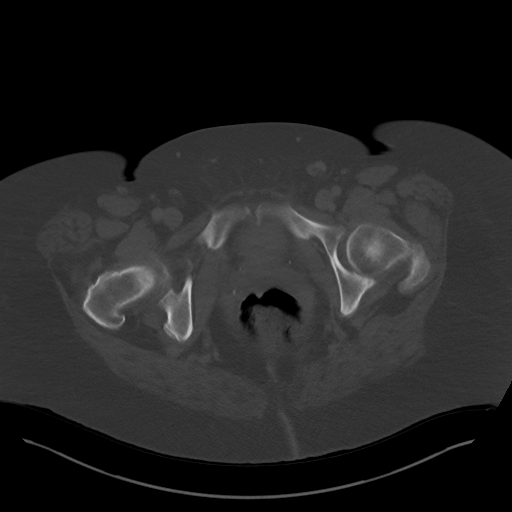
[im 22/128  mediastinal]
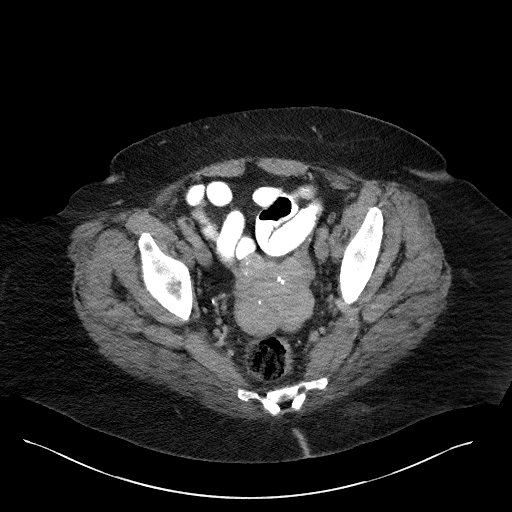
[im 32/128  mediastinal]
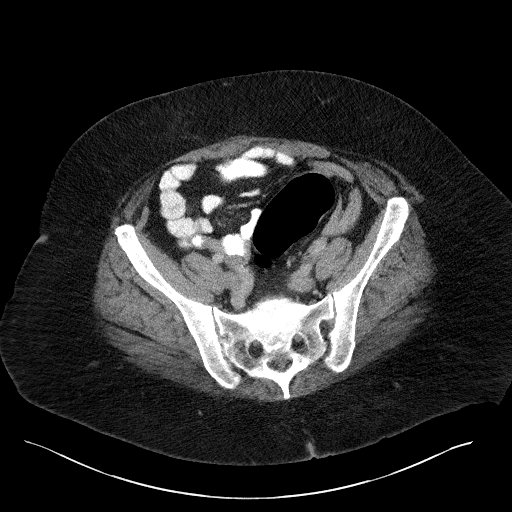
[im 43/128  mediastinal]
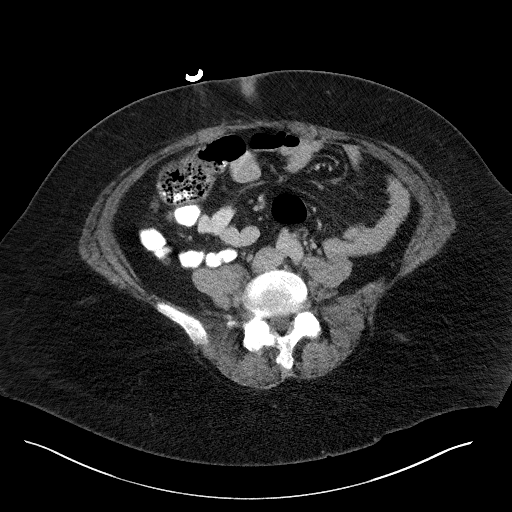
[im 53/128  mediastinal]
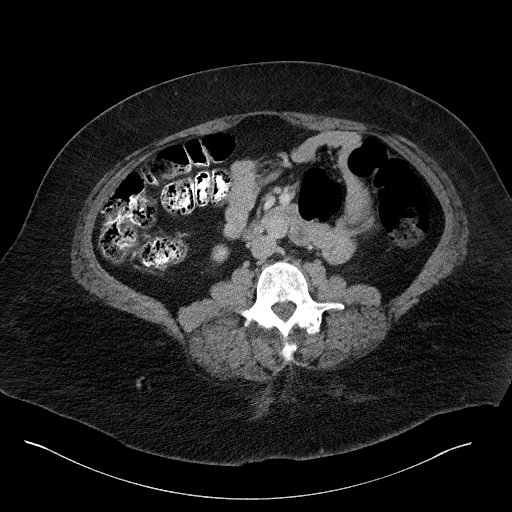
[im 64/128  mediastinal]
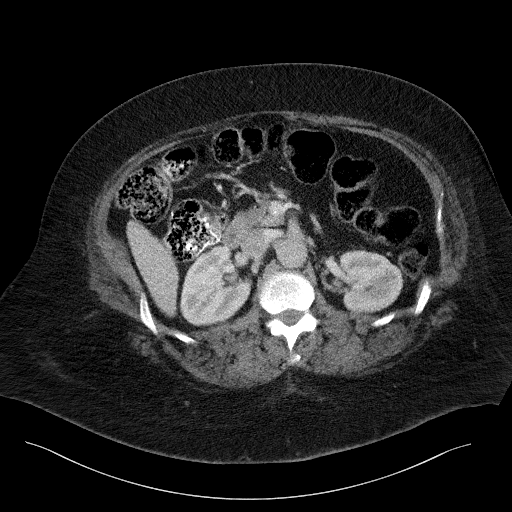
[im 75/128  mediastinal]
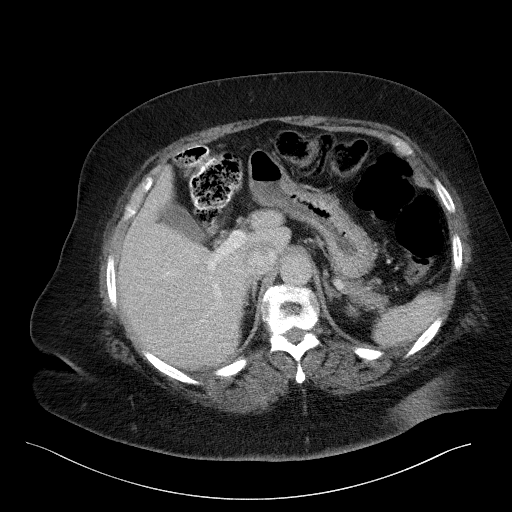
[im 85/128  mediastinal]
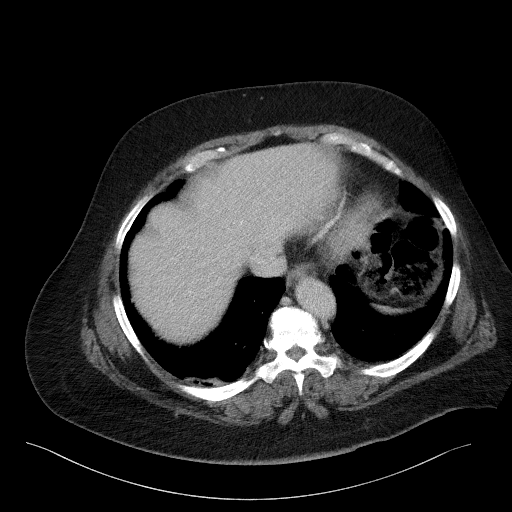
[im 96/128  mediastinal]
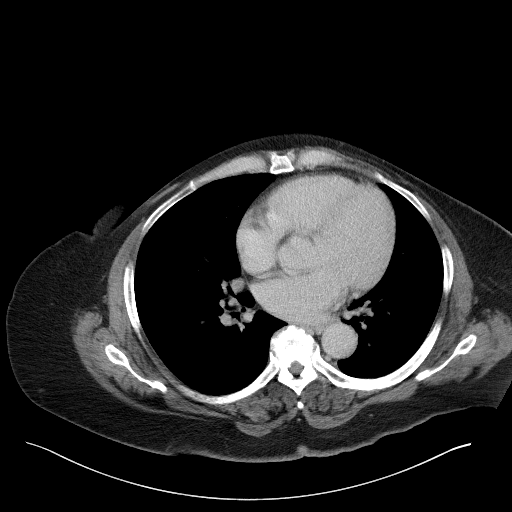
[im 96/128  bone]
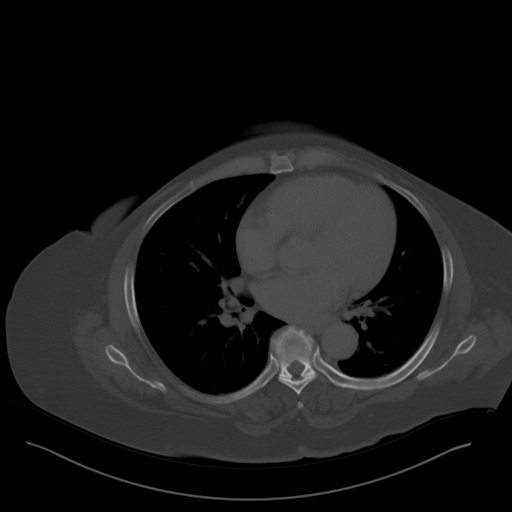
[im 106/128  mediastinal]
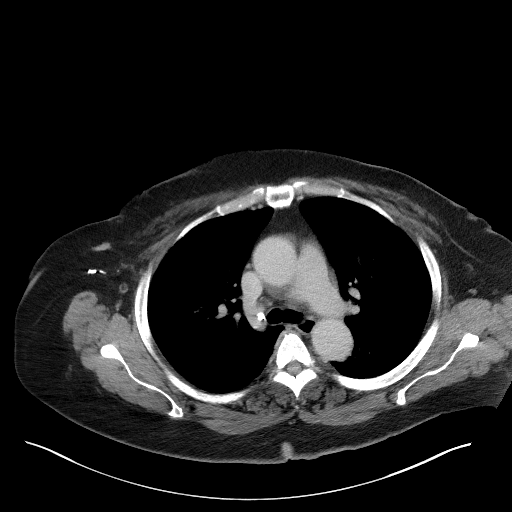
[im 117/128  mediastinal]
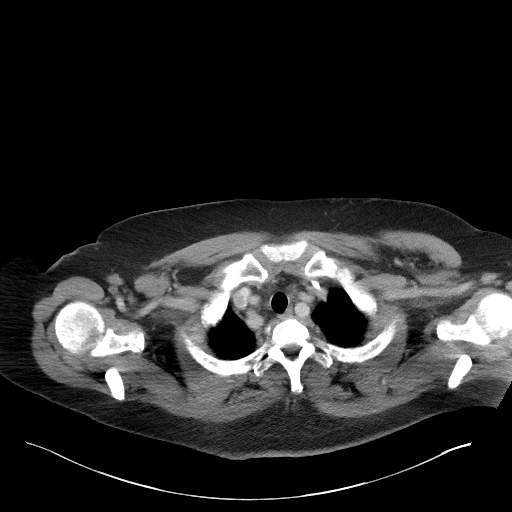

[Series 4: coronals · coronal · 0.94mm/px · 3 of 150 slices shown]
[im 30/150  mediastinal]
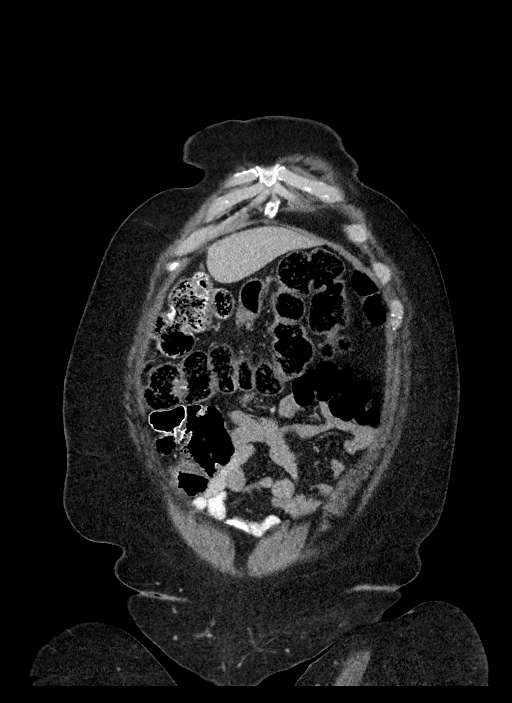
[im 60/150  mediastinal]
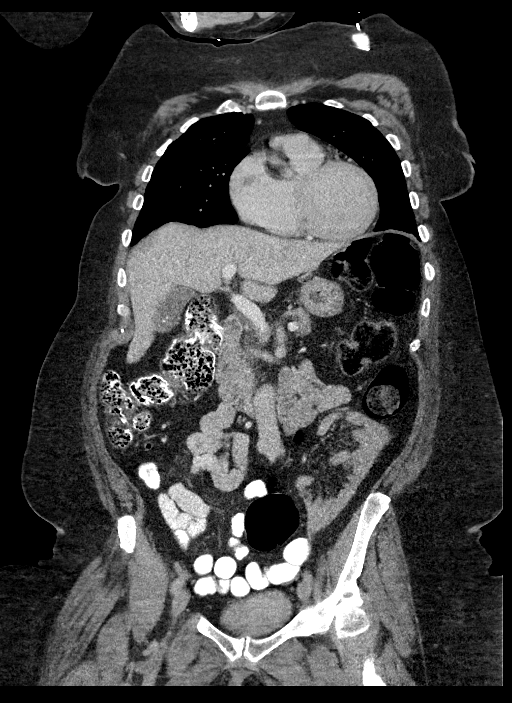
[im 90/150  mediastinal]
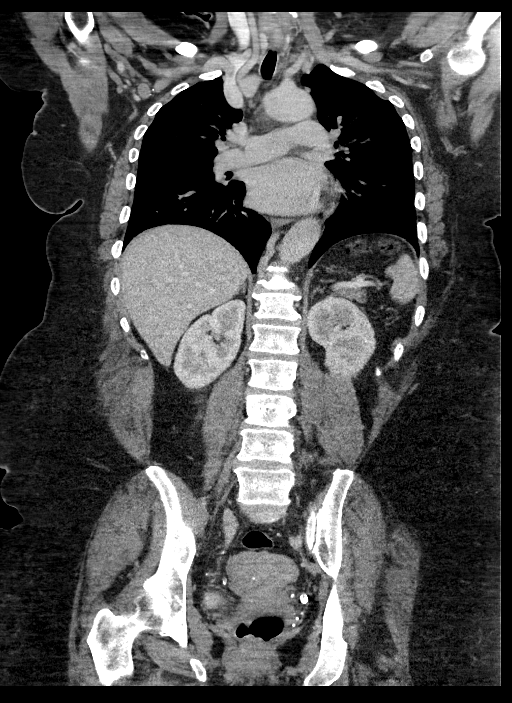

[14 of 36 positions shown; findings below may reference images not displayed]

RADIATION DOSE REDUCTION: This exam was performed according to the
departmental dose-optimization program which includes automated
exposure control, adjustment of the mA and/or kV according to
patient size and/or use of iterative reconstruction technique.

CONTRAST:  100mL OMNIPAQUE IOHEXOL 300 MG/ML  SOLN
FINDINGS: CT CHEST FINDINGS

Cardiovascular: The heart size is normal. No substantial pericardial
effusion. Mild atherosclerotic calcification is noted in the wall of
the thoracic aorta. Left Port-A-Cath tip is positioned in the
proximal SVC.

Mediastinum/Nodes: No mediastinal lymphadenopathy. There is no hilar
lymphadenopathy. The esophagus has normal imaging features. There is
no axillary lymphadenopathy. Surgical clips again noted right
axilla.

Lungs/Pleura: No suspicious pulmonary nodule or mass. No focal
airspace consolidation. Bandlike opacity posterior right lower lobe
on image 109/6 is unchanged in the 1 year interval consistent with
benign etiology such as scarring.

Musculoskeletal: No worrisome lytic or sclerotic osseous
abnormality. Bilateral mastectomy. Bandlike area of soft tissue
measured previously in the upper outer quadrant of the right breast
is stable on image [DATE] today.

CT ABDOMEN PELVIS FINDINGS

Hepatobiliary: No suspicious focal abnormality within the liver
parenchyma. Tiny gallstones identified on image 59/2. No
intrahepatic or extrahepatic biliary dilation.

Pancreas: No focal mass lesion. No dilatation of the main duct. No
intraparenchymal cyst. No peripancreatic edema.

Spleen: No splenomegaly. No focal mass lesion.

Adrenals/Urinary Tract: No adrenal nodule or mass. Kidneys
unremarkable. No evidence for hydroureter. The urinary bladder
appears normal for the degree of distention.

Stomach/Bowel: Stomach is unremarkable. No gastric wall thickening.
No evidence of outlet obstruction. Duodenum is normally positioned
as is the ligament of Treitz. No small bowel wall thickening. No
small bowel dilatation. The terminal ileum is normal. The appendix
is normal. No gross colonic mass. No colonic wall thickening. Left
colonic anastomosis again noted.

Vascular/Lymphatic: No abdominal aortic aneurysm. No abdominal
lymphadenopathy No pelvic sidewall lymphadenopathy.

Reproductive: Stable appearance uterine fibroid disease. There is no
adnexal mass.

Other: No intraperitoneal free fluid.

Musculoskeletal: No worrisome lytic or sclerotic osseous
abnormality. Degenerative changes are noted in the left hip.
IMPRESSION: 1. Stable exam. No new or progressive findings to suggest recurrent
or metastatic disease.
2. Cholelithiasis.
3. Uterine fibroid disease.
4. Aortic Atherosclerosis ([TR]-[TR]).

## 2021-09-26 MED ORDER — HEPARIN SOD (PORK) LOCK FLUSH 100 UNIT/ML IV SOLN
500.0000 [IU] | Freq: Once | INTRAVENOUS | Status: AC
  Administered 2021-09-26: 500 [IU] via INTRAVENOUS

## 2021-09-26 MED ORDER — IOHEXOL 300 MG/ML  SOLN
100.0000 mL | Freq: Once | INTRAMUSCULAR | Status: AC | PRN
  Administered 2021-09-26: 100 mL via INTRAVENOUS

## 2021-09-26 MED ORDER — SODIUM CHLORIDE (PF) 0.9 % IJ SOLN
INTRAMUSCULAR | Status: AC
  Filled 2021-09-26: qty 50

## 2021-09-26 MED ORDER — GADOBUTROL 1 MMOL/ML IV SOLN
10.0000 mL | Freq: Once | INTRAVENOUS | Status: AC | PRN
  Administered 2021-09-26: 10 mL via INTRAVENOUS

## 2021-09-26 MED ORDER — HEPARIN SOD (PORK) LOCK FLUSH 100 UNIT/ML IV SOLN
INTRAVENOUS | Status: AC
  Filled 2021-09-26: qty 5

## 2021-09-27 ENCOUNTER — Ambulatory Visit: Payer: Medicare Other | Admitting: Neurology

## 2021-09-28 ENCOUNTER — Encounter: Payer: Self-pay | Admitting: Podiatrist

## 2021-09-28 ENCOUNTER — Ambulatory Visit (INDEPENDENT_AMBULATORY_CARE_PROVIDER_SITE_OTHER): Payer: Medicare Other | Admitting: Podiatrist

## 2021-09-28 ENCOUNTER — Other Ambulatory Visit: Payer: Self-pay

## 2021-09-28 ENCOUNTER — Encounter: Payer: Self-pay | Admitting: Hematology and Oncology

## 2021-09-28 ENCOUNTER — Ambulatory Visit (INDEPENDENT_AMBULATORY_CARE_PROVIDER_SITE_OTHER): Payer: Medicare Other

## 2021-09-28 DIAGNOSIS — S9001XA Contusion of right ankle, initial encounter: Secondary | ICD-10-CM

## 2021-09-28 DIAGNOSIS — S99919A Unspecified injury of unspecified ankle, initial encounter: Secondary | ICD-10-CM

## 2021-09-28 DIAGNOSIS — S93491A Sprain of other ligament of right ankle, initial encounter: Secondary | ICD-10-CM | POA: Diagnosis not present

## 2021-09-28 NOTE — Progress Notes (Signed)
Chief Complaint  Patient presents with   Ankle Pain    Lateral ankle right - fell Tuesday (1/31), twisted, discolored, swollen, can't bear full weight (in a wheelchair today), been elevating   Toe Pain    Requesting trimming of corns 5th toes bilateral      HPI: Patient is 56 y.o. female who presents today for pain on the lateral aspect the right heel.  She states she fell Tuesday where she had was walking and she just tripped she states that her foot went under her body and that she fell over.  She also hurt her hand.  She states she is unable to put weight on her foot.  She also states she previously had an ankle fracture and she has hardware in this ankle.  She also has a corn on the outside of the right fifth toe that is bothersome.  She is normally seen here for preventive foot care.  Patient Active Problem List   Diagnosis Date Noted   Chemotherapy-induced neuropathy ([place]) 09/11/2021   Localization-related (focal) (partial) symptomatic epilepsy and epileptic syndromes with complex partial seizures, not intractable, without status epilepticus ([place]) 09/11/2021   Essential hypertension 05/30/2021   Acute encephalopathy 05/19/2021   Port-A-Cath in place 01/16/2021   Migraine without aura 12/19/2020   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[place]) 09/22/2020   Hx of breast cancer 06/29/2020   Gastroesophageal reflux disease without esophagitis 06/29/2020   Other chronic pain 06/29/2020   Moderate episode of recurrent major depressive disorder ([place]) 06/29/2020   Urinary incontinence 06/29/2020    Current Outpatient Medications on File Prior to Visit  Medication Sig Dispense Refill   acetaminophen (TYLENOL) 500 MG tablet Take 1,000 mg by mouth every 6 (six) hours as needed.     amLODipine (NORVASC) 5 MG tablet Take 1 tablet (5 mg total) by mouth daily. 30 tablet 3   divalproex (DEPAKOTE) 500 MG DR tablet Take 1 tablet (500 mg total) by mouth 2 (two) times daily. 60 tablet 3    DULoxetine (CYMBALTA) 60 MG capsule Take 1 capsule (60 mg total) by mouth daily. 90 capsule 1   furosemide (LASIX) 20 MG tablet Take 1 tablet (20 mg total) by mouth daily. 30 tablet 3   gabapentin (NEURONTIN) 300 MG capsule Take 1 cap in AM, 1 cap in afternoon, 2 caps at bedtime 120 capsule 11   MELATONIN GUMMIES PO Take 10 mg by mouth.     mirabegron ER (MYRBETRIQ) 50 MG TB24 tablet Take 1 tablet (50 mg total) by mouth daily. 90 tablet 1   traMADol (ULTRAM) 50 MG tablet Take 2 tablets (100 mg total) by mouth 2 (two) times daily as needed. 60 tablet 0   No current facility-administered medications on file prior to visit.    Allergies  Allergen Reactions   Dilaudid [Hydromorphone Hcl] Other (See Comments)    Confusion & hallucinations   Morphine And Related Other (See Comments)    HA   Penicillins    Tramadol     Review of Systems No fevers, chills, nausea, muscle aches, no difficulty breathing, no calf pain, no chest pain or shortness of breath.   Physical Exam  GENERAL APPEARANCE: Alert, conversant. Appropriately groomed. No acute distress.   VASCULAR: Pedal pulses palpable at 1/4 DP and faintly palpable PT bilateral.  Capillary refill time is immediate to all digits,  Proximal to distal cooling it warm to warm.  Digital perfusion adequate.   NEUROLOGIC: sensation is intact to 5.07  monofilament at 2/5 sites bilateral.  Light touch is intact bilateral, vibratory sensation absent bilateral.  Subjective symptoms of neuropathy are reported.  MUSCULOSKELETAL: acceptable muscle strength, tone and stability bilateral.  No gross boney pedal deformities noted.  Pain at the anterior talofibular ligament of the right ankle is noted pain along the peroneal tendons is also noted with some swelling noted as well.  Subjective pain with examination is noted.  No pain with medial to lateral squeeze test of the calcaneus no pain with medial to lateral squeeze of the tibia and fibula.  DERMATOLOGIC:  skin is warm, supple, and dry.  No open lesions noted.  No rash, no pre ulcerative lesions.  Superficial corn is present on the lateral aspect of the right fifth digit.  No debridement needed at today's visit.[place] evaluation foot and ankle views of the right are obtained at today's visit.  Fibular hardware and a syndesmotic screw are noted to be in place.  No sign of fracture or dislocation is noted.  No acute osseous abnormalities are noted.  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   Assessment     ICD-10-CM   1. Sprain of anterior talofibular ligament of right ankle, initial encounter  S93.491A     2. ankle sprain  S90.01XA DG Ankle Complete Right    3. Ankle injury, initial encounter  S99.919A        Plan  Discussed exam and x-ray findings with the patient.  I discussed that she likely sustained a sprain as there is no fracture noted on x-ray.  Did recommend ice rest compression as well as boot immobilization.  A Ace wrap was applied as well as a air fracture walker.  She will be seen back in 4 weeks for recheck and follow-up.  She will call sooner if any concerns arise.

## 2021-09-28 NOTE — Patient Instructions (Signed)
Ankle Sprain An ankle sprain is a stretch or tear in one of the tough tissues (ligaments) that connect the bones in your ankle. An ankle sprain can happen when the ankle rolls outward (inversion sprain) or inward (eversion sprain). What are the causes? This condition is caused by rolling or twisting the ankle. What increases the risk? You are more likely to develop this condition if you play sports. What are the signs or symptoms? Symptoms of this condition include: Pain in your ankle. Swelling. Bruising. This may happen right after you sprain your ankle or 1-2 days later. Trouble standing or walking. How is this diagnosed? This condition is diagnosed with: A physical exam. During the exam, your doctor will press on certain parts of your foot and ankle and try to move them in certain ways. X-ray imaging. These may be taken to see how bad the sprain is and to check for broken bones. How is this treated? This condition may be treated with: A brace or splint. This is used to keep the ankle from moving until it heals. An elastic bandage. This is used to support the ankle. Crutches. Pain medicine. Surgery. This may be needed if the sprain is very bad. Physical therapy. This may help to improve movement in the ankle. Follow these instructions at home: If you have a brace or a splint: Wear the brace or splint as told by your doctor. Remove it only as told by your doctor. Loosen the brace or splint if your toes: Tingle. Lose feeling (become numb). Turn cold and blue. Keep the brace or splint clean. If the brace or splint is not waterproof: Do not let it get wet. Cover it with a watertight covering when you take a bath or a shower. If you have an elastic bandage (dressing): Remove it to shower or bathe. Try not to move your ankle much, but wiggle your toes from time to time. This helps to prevent swelling. Adjust the dressing if it feels too tight. Loosen the dressing if your  foot: Loses feeling. Tingles. Becomes cold and blue. Managing pain, stiffness, and swelling  Take over-the-counter and prescription medicines only as told by doctor. For 2-3 days, keep your ankle raised (elevated) above the level of your heart. If told, put ice on the injured area: If you have a removable brace or splint, remove it as told by your doctor. Put ice in a plastic bag. Place a towel between your skin and the bag. Leave the ice on for 20 minutes, 2-3 times a day. General instructions Rest your ankle. Do not use your injured leg to support your body weight until your doctor says that you can. Use crutches as told by your doctor. Do not use any products that contain nicotine or tobacco, such as cigarettes, e-cigarettes, and chewing tobacco. If you need help quitting, ask your doctor. Keep all follow-up visits as told by your doctor. Contact a doctor if: Your bruises or swelling are quickly getting worse. Your pain does not get better after you take medicine. Get help right away if: You cannot feel your toes or foot. Your foot or toes look blue. You have very bad pain that gets worse. Summary An ankle sprain is a stretch or tear in one of the tough tissues (ligaments) that connect the bones in your ankle. This condition is caused by rolling or twisting the ankle. Symptoms include pain, swelling, bruising, and trouble walking. To help with pain and swelling, put ice on the injured ankle,  raise your ankle above the level of your heart, and use an elastic bandage. Also, rest as told by your doctor. Keep all follow-up visits as told by your doctor. This is important. This information is not intended to replace advice given to you by your health care provider. Make sure you discuss any questions you have with your health care provider. Document Revised: 10/06/2020 Document Reviewed: 10/06/2020 Elsevier Patient Education  2022 [employer].

## 2021-10-02 ENCOUNTER — Inpatient Hospital Stay: Payer: Medicare Other | Attending: Internal Medicine

## 2021-10-02 DIAGNOSIS — Z5112 Encounter for antineoplastic immunotherapy: Secondary | ICD-10-CM | POA: Insufficient documentation

## 2021-10-02 DIAGNOSIS — Z79899 Other long term (current) drug therapy: Secondary | ICD-10-CM | POA: Insufficient documentation

## 2021-10-02 DIAGNOSIS — C50911 Malignant neoplasm of unspecified site of right female breast: Secondary | ICD-10-CM | POA: Insufficient documentation

## 2021-10-02 DIAGNOSIS — Z923 Personal history of irradiation: Secondary | ICD-10-CM | POA: Insufficient documentation

## 2021-10-02 DIAGNOSIS — Z9013 Acquired absence of bilateral breasts and nipples: Secondary | ICD-10-CM | POA: Insufficient documentation

## 2021-10-02 DIAGNOSIS — Z9221 Personal history of antineoplastic chemotherapy: Secondary | ICD-10-CM | POA: Insufficient documentation

## 2021-10-02 DIAGNOSIS — Z171 Estrogen receptor negative status [ER-]: Secondary | ICD-10-CM | POA: Insufficient documentation

## 2021-10-02 DIAGNOSIS — C7931 Secondary malignant neoplasm of brain: Secondary | ICD-10-CM | POA: Insufficient documentation

## 2021-10-02 NOTE — Progress Notes (Signed)
Patient Care Team: Haydee Salter, MD as PCP - General (Family Medicine) Nicholas Lose, MD as Consulting Physician (Hematology and Oncology) Cameron Sprang, MD as Consulting Physician (Neurology)  DIAGNOSIS:    ICD-10-CM   1. Cancer of right breast metastatic to brain [facility])  C50.911    C79.31       SUMMARY OF ONCOLOGIC HISTORY: Oncology History  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[place])  06/13/2012 Initial Diagnosis   Right breast biopsy: Invasive ductal carcinoma, grade 3, HER-2 positive (3+), ER/PR negative, Ki67 5-10%, and in the right breast, high grade DCIS. BRCA gene testing was negative   08/06/2012 Surgery   Bilateral mastectomies:  Right breast: Invasive ductal carcinoma, 0.7cm, grade 2, with extensive DCIS, clear margins, 8/11 lymph nodes positive, measuring up to 3.8cm,  Left breast, no evidence of malignancy and 6 lymph nodes negative for carcinoma.    2014 - 2015 Chemotherapy   adjuvant chemotherapy with 4 cycles of dose dense Adriamycin and Cytoxan followed by 4 cycles of Taxol Herceptin and then Herceptin maintenance. She underwent right chest radiation.       12/24/2013 Relapse/Recurrence   Dizziness, headaches, ataxia, and memory loss. Head CT showed a 1.8cm and a 2.0cm frontal lobe masses. She underwent resection of both masses on 01/07/14,: Moderately differentiated carcinoma, likely of breast origin, HER-2 positive, ER/PR negative, CK7 positive.   2015 - 2015 Radiation Therapy   Received whole brain radiation    Chemotherapy   Taxotere, Herceptin and Perjeta, but Taxotere was discontinued after 4 cycles       11/21/2020 -  Chemotherapy   Patient is on Treatment Plan : BREAST Trastuzumab q21d       CHIEF COMPLIANT: Follow-up on Herceptin therapy  INTERVAL HISTORY: Monica Mccoy is a 56 y.o. with above-mentioned history of metastatic breast cancer who is currently on Herceptin maintenance therapy. She presents to the clinic today for  follow-up.  Patient had fallen recently and sprained her ankle and she now has a boot.  She is also mildly confused intermittently and we obtained a brain MRI which did not show any evidence of metastatic disease.  CT scan of the chest abdomen pelvis also showed no active  metastatic disease.  ALLERGIES:  is allergic to dilaudid [hydromorphone hcl], morphine and related, penicillins, and tramadol.  MEDICATIONS:  Current Outpatient Medications  Medication Sig Dispense Refill   acetaminophen (TYLENOL) 500 MG tablet Take 1,000 mg by mouth every 6 (six) hours as needed.     amLODipine (NORVASC) 5 MG tablet Take 1 tablet (5 mg total) by mouth daily. 30 tablet 3   divalproex (DEPAKOTE) 500 MG DR tablet Take 1 tablet (500 mg total) by mouth 2 (two) times daily. 60 tablet 3   DULoxetine (CYMBALTA) 60 MG capsule Take 1 capsule (60 mg total) by mouth daily. 90 capsule 1   furosemide (LASIX) 20 MG tablet Take 1 tablet (20 mg total) by mouth daily. 30 tablet 3   gabapentin (NEURONTIN) 300 MG capsule Take 1 cap in AM, 1 cap in afternoon, 2 caps at bedtime 120 capsule 11   MELATONIN GUMMIES PO Take 10 mg by mouth.     mirabegron ER (MYRBETRIQ) 50 MG TB24 tablet Take 1 tablet (50 mg total) by mouth daily. 90 tablet 1   traMADol (ULTRAM) 50 MG tablet Take 2 tablets (100 mg total) by mouth 2 (two) times daily as needed. 60 tablet 0   No current facility-administered medications for this visit.  PHYSICAL EXAMINATION: ECOG PERFORMANCE STATUS: 1 - Symptomatic but completely ambulatory  Vitals:   10/03/21 1109  BP: 132/90  Pulse: 69  Temp: 97.6 F (36.4 C)  SpO2: 99%   Filed Weights   10/03/21 1109  Weight: 289 lb 8 oz (131.3 kg)    LABORATORY DATA:  I have reviewed the data as listed CMP Latest Ref Rng & Units 10/03/2021 09/05/2021 08/14/2021  Glucose 70 - 99 mg/dL 95 114(H) 91  BUN 6 - 20 mg/dL $Remove'11 7 14  'IuiIRYB$ Creatinine 0.44 - 1.00 mg/dL 0.75 0.81 1.07(H)  Sodium 135 - 145 mmol/L 140 140 142   Potassium 3.5 - 5.1 mmol/L 3.5 3.9 3.8  Chloride 98 - 111 mmol/L 103 104 105  CO2 22 - 32 mmol/L 32 30 28  Calcium 8.9 - 10.3 mg/dL 8.7(L) 8.9 8.5(L)  Total Protein 6.5 - 8.1 g/dL 7.2 7.1 7.0  Total Bilirubin 0.3 - 1.2 mg/dL 0.7 0.7 0.9  Alkaline Phos 38 - 126 U/L 71 70 70  AST 15 - 41 U/L 12(L) 10(L) 14(L)  ALT 0 - 44 U/L $Remo'7 10 11    'PwCGa$ Lab Results  Component Value Date   WBC 3.4 (L) 10/03/2021   HGB 11.8 (L) 10/03/2021   HCT 36.7 10/03/2021   MCV 93.9 10/03/2021   PLT 163 10/03/2021   NEUTROABS 1.9 10/03/2021    ASSESSMENT & PLAN:  Cancer of right breast metastatic to brain [facility]) Moved to [facility] from [place], [facility]), in 02/2020 to live with her parents and son near her sister. (2013 HER2 positive breast cancer status post bilateral mastectomies and adjuvant chemotherapy with Herceptin, April 2015 brain metastases status post resection and radiation)   Prior treatment: Patient was on Herceptin and Perjeta maintenance until June 2021 Because of insurance issues she has not had treatment for 7 months until she moved to [place].   1. Severe recurrent headaches: Brain MRI 09/26/2021: No evidence of metastatic disease 2. intermittent confusion: I suspect this may be related to medications.  However we cannot stop any of her meds because of peripheral neuropathy requiring gabapentin and Cymbalta and Depakote for her history of tumor in the brain.  For seizure prevention.  Possibly prior radiation may have also contributed to the confusion.   10/20/2020: CT CAP: Post Op changes in Rt chest wall. Multilob uterus.     Current treatment: Herceptin maintenance every 4 weeks started 11/21/2020 Herceptin toxicities: None   Joint pains/chest wall pains: On Tramadol, I sent a refill on tramadol today.   CT chest abdomen pelvis 09/27/2021: No new or progressive findings to suggest recurrent or metastatic disease Brain MRI 09/26/53 (confusion as well as  frequent falls.):  No intracranial abnormalities  Follow-up in 4 weeks for Herceptin and every 12 weeks for follow-up with me.    No orders of the defined types were placed in this encounter.  The patient has a good understanding of the overall plan. she agrees with it. she will call with any problems that may develop before the next visit here.  Total time spent: 30 mins including face to face time and time spent for planning, charting and coordination of care  Rulon Eisenmenger, MD, MPH 10/03/2021  I, Thana Ates, am acting as scribe for Dr. Nicholas Lose.  I have reviewed the above documentation for accuracy and completeness, and I agree with the above.

## 2021-10-03 ENCOUNTER — Other Ambulatory Visit: Payer: Self-pay

## 2021-10-03 ENCOUNTER — Inpatient Hospital Stay: Payer: Medicare Other

## 2021-10-03 ENCOUNTER — Inpatient Hospital Stay (HOSPITAL_BASED_OUTPATIENT_CLINIC_OR_DEPARTMENT_OTHER): Payer: Medicare Other | Admitting: Hematology and Oncology

## 2021-10-03 ENCOUNTER — Inpatient Hospital Stay (HOSPITAL_BASED_OUTPATIENT_CLINIC_OR_DEPARTMENT_OTHER): Payer: Medicare Other | Admitting: Internal Medicine

## 2021-10-03 VITALS — BP 130/99 | HR 70 | Temp 98.7°F | Resp 19 | Ht 62.0 in

## 2021-10-03 VITALS — Resp 17

## 2021-10-03 DIAGNOSIS — Z9221 Personal history of antineoplastic chemotherapy: Secondary | ICD-10-CM | POA: Diagnosis not present

## 2021-10-03 DIAGNOSIS — G40209 Localization-related (focal) (partial) symptomatic epilepsy and epileptic syndromes with complex partial seizures, not intractable, without status epilepticus: Secondary | ICD-10-CM | POA: Diagnosis not present

## 2021-10-03 DIAGNOSIS — G4719 Other hypersomnia: Secondary | ICD-10-CM | POA: Diagnosis not present

## 2021-10-03 DIAGNOSIS — Z95828 Presence of other vascular implants and grafts: Secondary | ICD-10-CM

## 2021-10-03 DIAGNOSIS — Z923 Personal history of irradiation: Secondary | ICD-10-CM | POA: Diagnosis not present

## 2021-10-03 DIAGNOSIS — C7931 Secondary malignant neoplasm of brain: Secondary | ICD-10-CM

## 2021-10-03 DIAGNOSIS — C50911 Malignant neoplasm of unspecified site of right female breast: Secondary | ICD-10-CM

## 2021-10-03 DIAGNOSIS — Z5112 Encounter for antineoplastic immunotherapy: Secondary | ICD-10-CM | POA: Diagnosis present

## 2021-10-03 DIAGNOSIS — G43009 Migraine without aura, not intractable, without status migrainosus: Secondary | ICD-10-CM | POA: Diagnosis not present

## 2021-10-03 DIAGNOSIS — Z79899 Other long term (current) drug therapy: Secondary | ICD-10-CM | POA: Diagnosis not present

## 2021-10-03 DIAGNOSIS — Z171 Estrogen receptor negative status [ER-]: Secondary | ICD-10-CM | POA: Diagnosis not present

## 2021-10-03 DIAGNOSIS — Z9013 Acquired absence of bilateral breasts and nipples: Secondary | ICD-10-CM | POA: Diagnosis not present

## 2021-10-03 LAB — CBC WITH DIFFERENTIAL (CANCER CENTER ONLY)
Abs Immature Granulocytes: 0.01 10*3/uL (ref 0.00–0.07)
Basophils Absolute: 0 10*3/uL (ref 0.0–0.1)
Basophils Relative: 1 %
Eosinophils Absolute: 0.1 10*3/uL (ref 0.0–0.5)
Eosinophils Relative: 3 %
HCT: 36.7 % (ref 36.0–46.0)
Hemoglobin: 11.8 g/dL — ABNORMAL LOW (ref 12.0–15.0)
Immature Granulocytes: 0 %
Lymphocytes Relative: 21 %
Lymphs Abs: 0.7 10*3/uL (ref 0.7–4.0)
MCH: 30.2 pg (ref 26.0–34.0)
MCHC: 32.2 g/dL (ref 30.0–36.0)
MCV: 93.9 fL (ref 80.0–100.0)
Monocytes Absolute: 0.7 10*3/uL (ref 0.1–1.0)
Monocytes Relative: 20 %
Neutro Abs: 1.9 10*3/uL (ref 1.7–7.7)
Neutrophils Relative %: 55 %
Platelet Count: 163 10*3/uL (ref 150–400)
RBC: 3.91 MIL/uL (ref 3.87–5.11)
RDW: 12.8 % (ref 11.5–15.5)
WBC Count: 3.4 10*3/uL — ABNORMAL LOW (ref 4.0–10.5)
nRBC: 0 % (ref 0.0–0.2)

## 2021-10-03 LAB — CMP (CANCER CENTER ONLY)
ALT: 7 U/L (ref 0–44)
AST: 12 U/L — ABNORMAL LOW (ref 15–41)
Albumin: 3.8 g/dL (ref 3.5–5.0)
Alkaline Phosphatase: 71 U/L (ref 38–126)
Anion gap: 5 (ref 5–15)
BUN: 11 mg/dL (ref 6–20)
CO2: 32 mmol/L (ref 22–32)
Calcium: 8.7 mg/dL — ABNORMAL LOW (ref 8.9–10.3)
Chloride: 103 mmol/L (ref 98–111)
Creatinine: 0.75 mg/dL (ref 0.44–1.00)
GFR, Estimated: >60 mL/min (ref >60)
Glucose, Bld: 95 mg/dL (ref 70–99)
Potassium: 3.5 mmol/L (ref 3.5–5.1)
Sodium: 140 mmol/L (ref 135–145)
Total Bilirubin: 0.7 mg/dL (ref 0.3–1.2)
Total Protein: 7.2 g/dL (ref 6.5–8.1)

## 2021-10-03 MED ORDER — TRASTUZUMAB-DKST CHEMO 150 MG IV SOLR
750.0000 mg | Freq: Once | INTRAVENOUS | Status: AC
  Administered 2021-10-03: 750 mg via INTRAVENOUS
  Filled 2021-10-03: qty 35.72

## 2021-10-03 MED ORDER — SODIUM CHLORIDE 0.9 % IV SOLN: Freq: Once | INTRAVENOUS | Status: AC

## 2021-10-03 MED ORDER — SODIUM CHLORIDE 0.9% FLUSH: 10.0000 mL | INTRAVENOUS | Status: DC | PRN

## 2021-10-03 MED ORDER — ACETAMINOPHEN 325 MG PO TABS
650.0000 mg | ORAL_TABLET | Freq: Once | ORAL | Status: AC
  Administered 2021-10-03: 650 mg via ORAL
  Filled 2021-10-03: qty 2

## 2021-10-03 MED ORDER — TRAMADOL HCL 50 MG PO TABS: 100.0000 mg | ORAL_TABLET | Freq: Two times a day (BID) | ORAL | 0 refills | Status: DC | PRN

## 2021-10-03 MED ORDER — SODIUM CHLORIDE 0.9% FLUSH
10.0000 mL | INTRAVENOUS | Status: DC | PRN
  Administered 2021-10-03: 10 mL

## 2021-10-03 MED ORDER — DIPHENHYDRAMINE HCL 25 MG PO CAPS
50.0000 mg | ORAL_CAPSULE | Freq: Once | ORAL | Status: AC
  Administered 2021-10-03: 50 mg via ORAL
  Filled 2021-10-03: qty 2

## 2021-10-03 MED ORDER — HEPARIN SOD (PORK) LOCK FLUSH 100 UNIT/ML IV SOLN
500.0000 [IU] | Freq: Once | INTRAVENOUS | Status: AC | PRN
  Administered 2021-10-03: 500 [IU]

## 2021-10-03 NOTE — Progress Notes (Signed)
[facility] [place], [place] [address] [phone]   Interval Evaluation  Date of Service: 10/03/21 Patient Name: Monica Mccoy Patient MRN: [id number] Patient DOB: 1965-09-14 Provider: Ventura Sellers, MD  Identifying Statement:  Fredrick Dray is a 56 y.o. female with migraine headaches  Primary Cancer:  Oncologic History: Oncology History  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[place])  06/13/2012 Initial Diagnosis   Right breast biopsy: Invasive ductal carcinoma, grade 3, HER-2 positive (3+), ER/PR negative, Ki67 5-10%, and in the right breast, high grade DCIS. BRCA gene testing was negative   08/06/2012 Surgery   Bilateral mastectomies:  Right breast: Invasive ductal carcinoma, 0.7cm, grade 2, with extensive DCIS, clear margins, 8/11 lymph nodes positive, measuring up to 3.8cm,  Left breast, no evidence of malignancy and 6 lymph nodes negative for carcinoma.    2014 - 2015 Chemotherapy   adjuvant chemotherapy with 4 cycles of dose dense Adriamycin and Cytoxan followed by 4 cycles of Taxol Herceptin and then Herceptin maintenance. She underwent right chest radiation.       12/24/2013 Relapse/Recurrence   Dizziness, headaches, ataxia, and memory loss. Head CT showed a 1.8cm and a 2.0cm frontal lobe masses. She underwent resection of both masses on 01/07/14,: Moderately differentiated carcinoma, likely of breast origin, HER-2 positive, ER/PR negative, CK7 positive.   2015 - 2015 Radiation Therapy   Received whole brain radiation    Chemotherapy   Taxotere, Herceptin and Perjeta, but Taxotere was discontinued after 4 cycles       11/21/2020 -  Chemotherapy   Patient is on Treatment Plan : BREAST Trastuzumab q21d      CNS Oncologic History 2015: L frontal craniotomy followed by WBRT at [facility] in [place]  Interval History:  [name] presents today for headache follow up.  She continues to experience  frequent headaches, up to twice per week, but overall intensity is improved since starting the Depakote.  Dosing Tylenol or Tramadol during breakthrough events.  Currently has ankle injury and has been dosing Tramadol daily.  Today she also describes significant fatigue, sleepiness throughout the day.  Apparently she is a heavy snorer as well.  No other new complaints, continues on Herceptin with Dr. Lindi Adie.  H+P (10/24/20) Patient presents today to review and assess neurologic complaints.  Much of history was provided by sister present at bedside.  She describes chronic short term memory loss, fatigue, and depression symptoms since undergoing whole brain radiation for metastatic breast cancer in 2015.  She also describes difficulty with communication, forming sentences and getting words out properly/fluidly.  Also describes twice weekly migraine headaches associated with nausea/vomiting, photophobia.  She will take a Tylenol or Tramodol late in the headache course, with minimal efficacy. Currently living with family in [place] after living most of life in [place] area.  Needs assistance with many ADLs because of cognitive impairment, physical disability secondary to chronic pain.  Her sleep pattern is very irregular.       Medications: Current Outpatient Medications on File Prior to Visit  Medication Sig Dispense Refill   acetaminophen (TYLENOL) 500 MG tablet Take 1,000 mg by mouth every 6 (six) hours as needed.     amLODipine (NORVASC) 5 MG tablet Take 1 tablet (5 mg total) by mouth daily. 30 tablet 3   divalproex (DEPAKOTE) 500 MG DR tablet Take 1 tablet (500 mg total) by mouth 2 (two) times daily. 60 tablet 3   DULoxetine (CYMBALTA)  60 MG capsule Take 1 capsule (60 mg total) by mouth daily. 90 capsule 1   furosemide (LASIX) 20 MG tablet Take 1 tablet (20 mg total) by mouth daily. 30 tablet 3   gabapentin (NEURONTIN) 300 MG capsule Take 1 cap in AM, 1 cap in afternoon, 2 caps at bedtime 120 capsule  11   MELATONIN GUMMIES PO Take 10 mg by mouth.     mirabegron ER (MYRBETRIQ) 50 MG TB24 tablet Take 1 tablet (50 mg total) by mouth daily. 90 tablet 1   traMADol (ULTRAM) 50 MG tablet Take 2 tablets (100 mg total) by mouth 2 (two) times daily as needed. 60 tablet 0   Current Facility-Administered Medications on File Prior to Visit  Medication Dose Route Frequency Provider Last Rate Last Admin   sodium chloride flush (NS) 0.9 % injection 10 mL  10 mL Intracatheter PRN Nicholas Lose, MD   10 mL at 10/03/21 1302    Allergies:  Allergies  Allergen Reactions   Dilaudid [Hydromorphone Hcl] Other (See Comments)    Confusion & hallucinations   Morphine And Related Other (See Comments)    HA   Penicillins    Tramadol    Past Medical History:  Past Medical History:  Diagnosis Date   Anxiety    Breast cancer metastasized to brain [facility])    Left breast   Chronic pain after cancer treatment    Depression    GERD (gastroesophageal reflux disease)    History of cancer chemotherapy    Breast cancer left breast   Seizure (HCC)    Past Surgical History:  Past Surgical History:  Procedure Laterality Date   BRAIN SURGERY     brain tumor   MASTECTOMY Bilateral    ORIF ANKLE FRACTURE Right    Social History:  Social History   Socioeconomic History   Marital status: Divorced    Spouse name: Not on file   Number of children: 3   Years of education: Not on file   Highest education level: Not on file  Occupational History   Occupation: Disabled  Tobacco Use   Smoking status: Never   Smokeless tobacco: Never  Vaping Use   Vaping Use: Never used  Substance and Sexual Activity   Alcohol use: Yes    Comment: On occasion   Drug use: Yes    Types: Marijuana    Comment: Occasional use   Sexual activity: Not Currently  Other Topics Concern   Not on file  Social History Narrative   Right handed   Drinks caffeine   Two story home   Social Determinants of Health   Financial  Resource Strain: Not on file  Food Insecurity: Not on file  Transportation Needs: Not on file  Physical Activity: Not on file  Stress: Not on file  Social Connections: Not on file  Intimate Partner Violence: Not on file   Family History:  Family History  Problem Relation Age of Onset   Obesity Mother    Hypertension Mother    Hyperlipidemia Mother    Diabetes Mother    Heart disease Mother     Review of Systems: Constitutional: Doesn't report fevers, chills or abnormal weight loss Eyes: Doesn't report blurriness of vision Ears, nose, mouth, throat, and face: Doesn't report sore throat Respiratory: Doesn't report cough, dyspnea or wheezes Cardiovascular: Doesn't report palpitation, chest discomfort  Gastrointestinal:  Doesn't report nausea, constipation, diarrhea GU: Doesn't report incontinence Skin: Doesn't report skin rashes Neurological: Per HPI Musculoskeletal: Doesn't  report joint pain Behavioral/Psych: Doesn't report anxiety  Physical Exam: Vitals:   10/03/21 0955  BP: (!) 130/99  Pulse: 70  Resp: 19  Temp: 98.7 F (37.1 C)  SpO2: 100%   KPS: 60. General: Alert, cooperative, pleasant, in no acute distress Head: Normal EENT: No conjunctival injection or scleral icterus.  Lungs: Resp effort normal Cardiac: Regular rate Abdomen: Non-distended abdomen Skin: No rashes cyanosis or petechiae. Extremities: No clubbing or edema  Neurologic Exam: Mental Status: Awake, alert, attentive to examiner. Oriented to self and environment. Language is noted for impairment in fluency, and frequent paraphasic errors.  Age advanced psychomotor slowing. Cranial Nerves: Visual acuity is grossly normal. Visual fields are full. Extra-ocular movements intact. No ptosis. Face is symmetric Motor: Tone and bulk are normal. Power is full in both arms and legs. Reflexes are symmetric, no pathologic reflexes present.  Sensory: Intact to light touch Gait: Deferred, orthopedic  limitation  Labs: I have reviewed the data as listed    Component Value Date/Time   NA 140 10/03/2021 1032   NA 139 12/15/2019 0000   K 3.5 10/03/2021 1032   CL 103 10/03/2021 1032   CO2 32 10/03/2021 1032   GLUCOSE 95 10/03/2021 1032   BUN 11 10/03/2021 1032   BUN 10 12/15/2019 0000   CREATININE 0.75 10/03/2021 1032   CALCIUM 8.7 (L) 10/03/2021 1032   PROT 7.2 10/03/2021 1032   ALBUMIN 3.8 10/03/2021 1032   AST 12 (L) 10/03/2021 1032   ALT 7 10/03/2021 1032   ALKPHOS 71 10/03/2021 1032   BILITOT 0.7 10/03/2021 1032   GFRNONAA >60 10/03/2021 1032   Lab Results  Component Value Date   WBC 3.4 (L) 10/03/2021   NEUTROABS 1.9 10/03/2021   HGB 11.8 (L) 10/03/2021   HCT 36.7 10/03/2021   MCV 93.9 10/03/2021   PLT 163 10/03/2021     Assessment/Plan Localization-related (focal) (partial) symptomatic epilepsy and epileptic syndromes with complex partial seizures, not intractable, without status epilepticus (HCC)  Migraine without aura and without status migrainosus, not intractable  Preslei Schneider continues to experience migraines, refractory to prophylaxis.  Given her excessive daytime sleepiness, snoring, body habitus, refractory headaches, we would recommend evaluation for sleep disordered breathing with polysomnography.  She is agreeable with sleep study given the above considerations.  Recommended continuing Depakote 500mg  BID given modest results, patient prefers to not increase dose at this time.  Brain MRI was reviewed, normal.  Next study can be in 1 year.  Also Ibuprofen or Naprosyn for breakthrough headaches.   We appreciate the opportunity to participate in the care of [facility].    We ask that [name] return to clinic in 1 months following sleep study, or sooner as needed.  All questions were answered. The patient knows to call the clinic with any problems, questions or concerns. No barriers to learning were detected.  The total time spent in  the encounter was 30 minutes and more than 50% was on counseling and review of test results   Ventura Sellers, MD Medical Director of Neuro-Oncology [facility] 10/03/21 1:43 PM

## 2021-10-03 NOTE — Patient Instructions (Signed)
[facility] MEDICAL ONCOLOGY  Discharge Instructions: ?Thank you for choosing [facility] to provide your oncology and hematology care.  ? ?If you have a lab appointment with the Cancer Center, please go directly to the Cancer Center and check in at the registration area. ?  ?Wear comfortable clothing and clothing appropriate for easy access to any Portacath or PICC line.  ? ?We strive to give you quality time with your provider. You may need to reschedule your appointment if you arrive late (15 or more minutes).  Arriving late affects you and other patients whose appointments are after yours.  Also, if you miss three or more appointments without notifying the office, you may be dismissed from the clinic at the provider?s discretion.    ?  ?For prescription refill requests, have your pharmacy contact our office and allow 72 hours for refills to be completed.   ? ?Today you received the following chemotherapy and/or immunotherapy agents: Trastuzumab    ?  ?To help prevent nausea and vomiting after your treatment, we encourage you to take your nausea medication as directed. ? ?BELOW ARE SYMPTOMS THAT SHOULD BE REPORTED IMMEDIATELY: ?*FEVER GREATER THAN 100.4 F (38 ?C) OR HIGHER ?*CHILLS OR SWEATING ?*NAUSEA AND VOMITING THAT IS NOT CONTROLLED WITH YOUR NAUSEA MEDICATION ?*UNUSUAL SHORTNESS OF BREATH ?*UNUSUAL BRUISING OR BLEEDING ?*URINARY PROBLEMS (pain or burning when urinating, or frequent urination) ?*BOWEL PROBLEMS (unusual diarrhea, constipation, pain near the anus) ?TENDERNESS IN MOUTH AND THROAT WITH OR WITHOUT PRESENCE OF ULCERS (sore throat, sores in mouth, or a toothache) ?UNUSUAL RASH, SWELLING OR PAIN  ?UNUSUAL VAGINAL DISCHARGE OR ITCHING  ? ?Items with * indicate a potential emergency and should be followed up as soon as possible or go to the Emergency Department if any problems should occur. ? ?Please show the CHEMOTHERAPY ALERT CARD or IMMUNOTHERAPY ALERT CARD at check-in  to the Emergency Department and triage nurse. ? ?Should you have questions after your visit or need to cancel or reschedule your appointment, please contact [facility]  Dept: [phone]  and follow the prompts.  Office hours are 8:00 a.m. to 4:30 p.m. Monday - Friday. Please note that voicemails left after 4:00 p.m. may not be returned until the following business day.  We are closed weekends and major holidays. You have access to a nurse at all times for urgent questions. Please call the main number to the clinic Dept: [phone] and follow the prompts. ? ? ?For any non-urgent questions, you may also contact your provider using MyChart. We now offer e-Visits for anyone 18 and older to request care online for non-urgent symptoms. For details visit [email or web address]. ?  ?Also download the MyChart app! Go to the app store, search "MyChart", open the app, select [facility], and log in with your MyChart username and password. ? ?Due to Covid, a mask is required upon entering the hospital/clinic. If you do not have a mask, one will be given to you upon arrival. For doctor visits, patients may have 1 support person aged 18 or older with them. For treatment visits, patients cannot have anyone with them due to current Covid guidelines and our immunocompromised population.  ? ?

## 2021-10-03 NOTE — Assessment & Plan Note (Signed)
Moved to [facility] from [employer]), in 02/2020 to live with her parents and son near her sister. (2013 HER2 positive breast cancer status post bilateral mastectomies and adjuvant chemotherapy with Herceptin, April 2015 brain metastases status post resection and radiation)  Prior treatment: Patient was on Herceptin and Perjeta maintenance until June 2021 Because of insurance issues she has not had treatmentfor7 monthsuntil she moved to [place].  1.Severe recurrent headaches:    10/20/2020: CT CAP: Post Op changes in Rt chest wall. Multilob uterus.   Current treatment:Herceptin maintenance every 4 weeks started 11/21/2020 Herceptin toxicities: None  Joint pains/chest wall pains: On Tramadol  CT chest abdomen pelvis 09/27/2021: No new or progressive findings to suggest recurrent or metastatic disease Brain MRI 09/26/2020 (confusion as well as frequent falls.):  No intracranial abnormalities  Follow-up in 3 weeks for Herceptin and every 6 weeks for follow-up with me.

## 2021-10-04 ENCOUNTER — Telehealth: Payer: Self-pay | Admitting: Hematology and Oncology

## 2021-10-04 NOTE — Telephone Encounter (Signed)
Scheduled appointment per 2/7 los. Talked with patients sister Monica Mccoy and updated her on the changes made to patients appointments. Patient will be mailed an updated calendar.

## 2021-10-05 ENCOUNTER — Telehealth: Payer: Self-pay | Admitting: Internal Medicine

## 2021-10-05 NOTE — Telephone Encounter (Signed)
Scheduled per 2/7 los, pt sister has been called and confirmed appt

## 2021-10-13 ENCOUNTER — Other Ambulatory Visit: Payer: Self-pay

## 2021-10-16 ENCOUNTER — Ambulatory Visit (INDEPENDENT_AMBULATORY_CARE_PROVIDER_SITE_OTHER): Payer: Medicare Other | Admitting: Family Medicine

## 2021-10-16 ENCOUNTER — Other Ambulatory Visit: Payer: Self-pay

## 2021-10-16 ENCOUNTER — Other Ambulatory Visit (INDEPENDENT_AMBULATORY_CARE_PROVIDER_SITE_OTHER): Payer: Medicare Other

## 2021-10-16 VITALS — BP 118/78 | HR 86 | Temp 97.5°F | Ht 62.0 in | Wt 289.8 lb

## 2021-10-16 DIAGNOSIS — C7931 Secondary malignant neoplasm of brain: Secondary | ICD-10-CM | POA: Diagnosis not present

## 2021-10-16 DIAGNOSIS — C50911 Malignant neoplasm of unspecified site of right female breast: Secondary | ICD-10-CM | POA: Diagnosis not present

## 2021-10-16 DIAGNOSIS — R600 Localized edema: Secondary | ICD-10-CM

## 2021-10-16 DIAGNOSIS — I700 Atherosclerosis of aorta: Secondary | ICD-10-CM | POA: Insufficient documentation

## 2021-10-16 DIAGNOSIS — F331 Major depressive disorder, recurrent, moderate: Secondary | ICD-10-CM | POA: Diagnosis not present

## 2021-10-16 DIAGNOSIS — D259 Leiomyoma of uterus, unspecified: Secondary | ICD-10-CM | POA: Insufficient documentation

## 2021-10-16 DIAGNOSIS — K8020 Calculus of gallbladder without cholecystitis without obstruction: Secondary | ICD-10-CM | POA: Insufficient documentation

## 2021-10-16 LAB — BASIC METABOLIC PANEL
BUN: 13 mg/dL (ref 6–23)
CO2: 30 mEq/L (ref 19–32)
Calcium: 10 mg/dL (ref 8.4–10.5)
Chloride: 102 mEq/L (ref 96–112)
Creatinine, Ser: 1.03 mg/dL (ref 0.40–1.20)
GFR: 61.38 mL/min (ref >60.00)
Glucose, Bld: 83 mg/dL (ref 70–99)
Potassium: 3.5 mEq/L (ref 3.5–5.1)
Sodium: 141 mEq/L (ref 135–145)

## 2021-10-16 MED ORDER — DULOXETINE HCL 60 MG PO CPEP: 60.0000 mg | ORAL_CAPSULE | Freq: Two times a day (BID) | ORAL | 3 refills | Status: DC

## 2021-10-16 NOTE — Progress Notes (Signed)
[facility] [address] [place] [address] Dept: [phone] Dept Fax: [phone]  Chronic Care Office Visit  Subjective:    Patient ID: Monica Mccoy, female    DOB: July 28, 1966, 56 y.o..   MRN: [id number]  Chief Complaint  Patient presents with   Follow-up    4 week f/u.  RT foot/leg swelling and lower back pain.      History of Present Illness:  Patient is in today for reassessment of chronic medical issues.  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 BRCA gene testing was negative. This was diagnosed in Oct. 2013. She underwent bilateral mastectomies in Dec. 2013. This was followed with chemotherapy and radiation. In April 2015, she was found to have two frontal lobe tumors. She underwent resection, again followed with radiation and chemotherapy. She is currently on Herceptin maintenance therapy under the care of Dr. Lindi Adie.    Monica Mccoy has a history of recurrent headache and increasing confusion. Dr. Lindi Adie has requested an MRI of the brain to evaluate for any potential recurrence of her brain metastasis. The scan showed no sign of recurrence. Dr. Lindi Adie feels there may be some medication side effects, but is reluctant to change her meds currently.   Monica Mccoy is being managed on Duloxetine related to chronic depression but also chronic pain. Her mood is not as cheerful lately. Her son is away currently, but it seems to be more than that.   Monica Mccoy has a history of pedal edema. She has noted increasing edema of the legs over the past few months. She recently had an echocardiogram.  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[place]) 09/22/2020   Hx of breast cancer 06/29/2020   Gastroesophageal reflux disease without esophagitis 06/29/2020   Other chronic pain 06/29/2020   Moderate episode of recurrent major depressive disorder ([place]) 06/29/2020   Urinary incontinence 06/29/2020   Past Surgical History:  Procedure Laterality Date   BRAIN SURGERY     brain tumor   MASTECTOMY Bilateral    ORIF ANKLE FRACTURE Right    Family History  Problem Relation Age of Onset   Obesity Mother    Hypertension Mother    Hyperlipidemia Mother    Diabetes Mother    Heart disease Mother    Outpatient Medications Prior to Visit  Medication Sig Dispense Refill   acetaminophen (TYLENOL) 500 MG tablet Take 1,000 mg by mouth every 6 (six) hours as needed.     amLODipine (NORVASC) 5 MG tablet Take 1 tablet (5 mg total) by mouth daily. 30 tablet 3   divalproex (DEPAKOTE) 500 MG DR tablet Take 1 tablet (500 mg total) by mouth 2 (two) times daily. 60 tablet 3   furosemide (LASIX) 20 MG tablet Take 1 tablet (20 mg total) by mouth daily. 30 tablet 3   gabapentin (NEURONTIN) 300 MG capsule Take 1 cap in AM, 1 cap in afternoon, 2 caps at bedtime 120 capsule 11   MELATONIN GUMMIES PO Take  10 mg by mouth.     mirabegron ER (MYRBETRIQ) 50 MG TB24 tablet Take 1 tablet (50 mg total) by mouth daily. 90 tablet 1   traMADol (ULTRAM) 50 MG tablet Take 2 tablets (100 mg total) by mouth 2 (two) times daily as needed. 60 tablet 0   DULoxetine (CYMBALTA) 60 MG capsule Take 1 capsule (60 mg total) by mouth daily. 90 capsule 1   No facility-administered medications prior to visit.   Allergies  Allergen Reactions   Dilaudid [Hydromorphone Hcl] Other (See Comments)     Confusion & hallucinations   Morphine And Related Other (See Comments)    HA   Penicillins    Tramadol     Objective:   Today's Vitals   10/16/21 1302  BP: 118/78  Pulse: 86  Temp: (!) 97.5 F (36.4 C)  TempSrc: Temporal  SpO2: 97%  Weight: 289 lb 12.8 oz (131.5 kg)  Height: 5\' 2"  (1.575 m)   Body mass index is 53.01 kg/m.   General: Well developed, well nourished. No acute distress. Extremities: Edema is 2-3+, but appears to be decreased compared to her last visit. Psych: Alert and oriented. Normal mood and affect.  Health Maintenance Due  Topic Date Due   Hepatitis C Screening  Never done   TETANUS/TDAP  Never done   Zoster Vaccines- Shingrix (1 of 2) Never done   PAP SMEAR-Modifier  Never done   COLONOSCOPY (Pts 45-23yrs Insurance coverage will need to be confirmed)  Never done   MAMMOGRAM  Never done   COVID-19 Vaccine (3 - Moderna risk series) 02/16/2020   Imaging: MR Brain W WO contrast (09/26/2021) IMPRESSION: 1. No acute intracranial abnormality or evidence of intracranial metastases. 2. Post treatment changes as above.  CT Chest, Abdomen, and Pelvis (09/26/2021) IMPRESSION: 1. Stable exam. No new or progressive findings to suggest recurrent or metastatic disease. 2. Cholelithiasis. 3. Uterine fibroid disease. 4. Aortic Atherosclerosis (ICD10-I70.0).   Echocardiogram (09/14/2021) IMPRESSIONS   1. Left ventricular ejection fraction, by estimation, is 55 to 60%. The left ventricle has normal function. The left ventricle has no regional wall motion abnormalities. The left ventricular internal cavity size was  mildly dilated. Left ventricular diastolic parameters were normal. The average left ventricular global  longitudinal strain is -20.6 %. The global longitudinal strain is normal.   2. Right ventricular systolic function is normal. The right ventricular size is normal. Tricuspid regurgitation signal is inadequate for assessing PA pressure.   3. The mitral  valve is normal in structure. Trivial mitral valve regurgitation. No evidence of mitral stenosis.   4. The aortic valve was not well visualized. Aortic valve regurgitation is not visualized. No aortic stenosis is present.     Depression screen [facility] 2/9 10/16/2021 08/30/2020 06/29/2020  Decreased Interest 2 0 2  Down, Depressed, Hopeless 2 2 2   PHQ - 2 Score 4 2 4   Altered sleeping 3 1 3   Tired, decreased energy 2 1 2   Change in appetite 1 0 2  Feeling bad or failure about yourself  0 1 0  Trouble concentrating 1 1 1   Moving slowly or fidgety/restless 2 1 2   Suicidal thoughts 0 0 0  PHQ-9 Score 13 7 14   Difficult doing work/chores Very difficult Not difficult at all Very difficult   GAD 7 : Generalized Anxiety Score 10/16/2021 06/29/2020  Nervous, Anxious, on Edge 2 3  Control/stop worrying 2 2  Worry too much - different things 1 2  Trouble relaxing 2 3  Restless 1 2  Easily annoyed or irritable 3 2  Afraid - awful might happen 2 1  Total GAD 7 Score 13 15  Anxiety Difficulty Somewhat difficult Somewhat difficult   Assessment & Plan:   1. Pedal edema There does not appear to be an underlying cardiac, hepatic, or renal cause for the edema. This is likely due to chronic venous stasis. I will recheck her potassium in light of the recent Lasix use. I will refer her to vascular to assess for any potential venous interventions to help her edema.  - Basic metabolic panel - Ambulatory referral to Vascular Surgery  2. Moderate episode of recurrent major depressive disorder (HCC) PHQ and Gad scores suggest mood is not optimally managed. We will give a trial of increasing the duloxetine. I will plant o see Ms. Flinn back in 6 weeks.  - DULoxetine (CYMBALTA) 60 MG capsule; Take 1 capsule (60 mg total) by mouth 2 (two) times daily.  Dispense: 180 capsule; Refill: 3  3. Cancer of right breast metastatic to brain [facility]) Continue to follow with oncology. On Herceptin.  Haydee Salter, MD

## 2021-10-18 ENCOUNTER — Other Ambulatory Visit: Payer: Self-pay | Admitting: Family Medicine

## 2021-10-18 NOTE — Telephone Encounter (Signed)
Refill request for Amlodipine 5 mg LR 05/21/21, #30, 2 rf (DR Regaldo) LOV 10/16/21 FOV 11/27/21  Please review and advise. Thanks.  Dm/cma

## 2021-10-24 ENCOUNTER — Ambulatory Visit: Payer: Medicare Other | Admitting: Podiatrist

## 2021-10-25 ENCOUNTER — Telehealth: Payer: Self-pay | Admitting: Internal Medicine

## 2021-10-25 NOTE — Telephone Encounter (Signed)
.  Called patient to schedule appointment per 2/28 inbasket, patient is aware of date and time.   ?

## 2021-10-31 ENCOUNTER — Other Ambulatory Visit: Payer: Medicare Other

## 2021-10-31 ENCOUNTER — Ambulatory Visit: Payer: Medicare Other

## 2021-10-31 ENCOUNTER — Ambulatory Visit: Payer: Medicare Other | Admitting: Hematology and Oncology

## 2021-10-31 ENCOUNTER — Ambulatory Visit: Payer: Medicare Other | Admitting: Internal Medicine

## 2021-11-02 ENCOUNTER — Telehealth: Payer: Self-pay | Admitting: Internal Medicine

## 2021-11-02 NOTE — Telephone Encounter (Signed)
Sister's patient Monica Mccoy) called in about scheduling Herceptin today or tomorrow since she did not receive it on 3/17. Called patients sister back and informed her that the patients tx will resume next week (3/14). Patient's sister is aware. ?

## 2021-11-03 ENCOUNTER — Encounter (HOSPITAL_BASED_OUTPATIENT_CLINIC_OR_DEPARTMENT_OTHER): Payer: Medicare Other | Admitting: Internal Medicine

## 2021-11-07 ENCOUNTER — Other Ambulatory Visit: Payer: Self-pay | Admitting: *Deleted

## 2021-11-07 ENCOUNTER — Inpatient Hospital Stay: Payer: Medicare Other | Admitting: Internal Medicine

## 2021-11-07 ENCOUNTER — Inpatient Hospital Stay: Payer: Medicare Other | Attending: Internal Medicine

## 2021-11-07 ENCOUNTER — Inpatient Hospital Stay: Payer: Medicare Other

## 2021-11-07 ENCOUNTER — Other Ambulatory Visit: Payer: Self-pay

## 2021-11-07 ENCOUNTER — Telehealth: Payer: Self-pay | Admitting: *Deleted

## 2021-11-07 VITALS — BP 126/96 | HR 69 | Temp 97.6°F | Resp 17 | Wt 296.5 lb

## 2021-11-07 DIAGNOSIS — G620 Drug-induced polyneuropathy: Secondary | ICD-10-CM | POA: Diagnosis not present

## 2021-11-07 DIAGNOSIS — Z923 Personal history of irradiation: Secondary | ICD-10-CM | POA: Insufficient documentation

## 2021-11-07 DIAGNOSIS — C50911 Malignant neoplasm of unspecified site of right female breast: Secondary | ICD-10-CM

## 2021-11-07 DIAGNOSIS — C7931 Secondary malignant neoplasm of brain: Secondary | ICD-10-CM | POA: Diagnosis present

## 2021-11-07 DIAGNOSIS — Z79899 Other long term (current) drug therapy: Secondary | ICD-10-CM | POA: Insufficient documentation

## 2021-11-07 DIAGNOSIS — Z9221 Personal history of antineoplastic chemotherapy: Secondary | ICD-10-CM | POA: Diagnosis not present

## 2021-11-07 DIAGNOSIS — Z95828 Presence of other vascular implants and grafts: Secondary | ICD-10-CM

## 2021-11-07 DIAGNOSIS — G43009 Migraine without aura, not intractable, without status migrainosus: Secondary | ICD-10-CM | POA: Diagnosis not present

## 2021-11-07 DIAGNOSIS — Z170 Estrogen receptor positive status [ER+]: Secondary | ICD-10-CM | POA: Insufficient documentation

## 2021-11-07 DIAGNOSIS — T451X5A Adverse effect of antineoplastic and immunosuppressive drugs, initial encounter: Secondary | ICD-10-CM | POA: Insufficient documentation

## 2021-11-07 DIAGNOSIS — Z5112 Encounter for antineoplastic immunotherapy: Secondary | ICD-10-CM | POA: Insufficient documentation

## 2021-11-07 LAB — CBC WITH DIFFERENTIAL (CANCER CENTER ONLY)
Abs Immature Granulocytes: 0.01 10*3/uL (ref 0.00–0.07)
Basophils Absolute: 0 10*3/uL (ref 0.0–0.1)
Basophils Relative: 1 %
Eosinophils Absolute: 0.1 10*3/uL (ref 0.0–0.5)
Eosinophils Relative: 2 %
HCT: 35.4 % — ABNORMAL LOW (ref 36.0–46.0)
Hemoglobin: 11.7 g/dL — ABNORMAL LOW (ref 12.0–15.0)
Immature Granulocytes: 0 %
Lymphocytes Relative: 20 %
Lymphs Abs: 0.7 10*3/uL (ref 0.7–4.0)
MCH: 30.6 pg (ref 26.0–34.0)
MCHC: 33.1 g/dL (ref 30.0–36.0)
MCV: 92.7 fL (ref 80.0–100.0)
Monocytes Absolute: 0.6 10*3/uL (ref 0.1–1.0)
Monocytes Relative: 18 %
Neutro Abs: 2.1 10*3/uL (ref 1.7–7.7)
Neutrophils Relative %: 59 %
Platelet Count: 148 10*3/uL — ABNORMAL LOW (ref 150–400)
RBC: 3.82 MIL/uL — ABNORMAL LOW (ref 3.87–5.11)
RDW: 12.6 % (ref 11.5–15.5)
WBC Count: 3.5 10*3/uL — ABNORMAL LOW (ref 4.0–10.5)
nRBC: 0 % (ref 0.0–0.2)

## 2021-11-07 LAB — CMP (CANCER CENTER ONLY)
ALT: 6 U/L (ref 0–44)
AST: 11 U/L — ABNORMAL LOW (ref 15–41)
Albumin: 3.7 g/dL (ref 3.5–5.0)
Alkaline Phosphatase: 76 U/L (ref 38–126)
Anion gap: 8 (ref 5–15)
BUN: 13 mg/dL (ref 6–20)
CO2: 30 mmol/L (ref 22–32)
Calcium: 8.6 mg/dL — ABNORMAL LOW (ref 8.9–10.3)
Chloride: 102 mmol/L (ref 98–111)
Creatinine: 0.8 mg/dL (ref 0.44–1.00)
GFR, Estimated: >60 mL/min (ref >60)
Glucose, Bld: 104 mg/dL — ABNORMAL HIGH (ref 70–99)
Potassium: 3.2 mmol/L — ABNORMAL LOW (ref 3.5–5.1)
Sodium: 140 mmol/L (ref 135–145)
Total Bilirubin: 0.9 mg/dL (ref 0.3–1.2)
Total Protein: 6.8 g/dL (ref 6.5–8.1)

## 2021-11-07 MED ORDER — SODIUM CHLORIDE 0.9% FLUSH
10.0000 mL | INTRAVENOUS | Status: DC | PRN
  Administered 2021-11-07: 10 mL

## 2021-11-07 MED ORDER — TRASTUZUMAB-DKST CHEMO 150 MG IV SOLR
6.0000 mg/kg | Freq: Once | INTRAVENOUS | Status: AC
  Administered 2021-11-07: 777 mg via INTRAVENOUS
  Filled 2021-11-07: qty 37

## 2021-11-07 MED ORDER — ACETAMINOPHEN 325 MG PO TABS
650.0000 mg | ORAL_TABLET | Freq: Once | ORAL | Status: AC
  Administered 2021-11-07: 650 mg via ORAL
  Filled 2021-11-07: qty 2

## 2021-11-07 MED ORDER — DIPHENHYDRAMINE HCL 25 MG PO CAPS
50.0000 mg | ORAL_CAPSULE | Freq: Once | ORAL | Status: AC
  Administered 2021-11-07: 50 mg via ORAL
  Filled 2021-11-07: qty 2

## 2021-11-07 MED ORDER — SODIUM CHLORIDE 0.9 % IV SOLN: Freq: Once | INTRAVENOUS | Status: AC

## 2021-11-07 MED ORDER — HEPARIN SOD (PORK) LOCK FLUSH 100 UNIT/ML IV SOLN
500.0000 [IU] | Freq: Once | INTRAVENOUS | Status: AC | PRN
  Administered 2021-11-07: 500 [IU]

## 2021-11-07 NOTE — Telephone Encounter (Signed)
Monica Mccoy seated in wheelchair of main entry lobby near wheelchair area before Monica Mccoy struggling to reach items dropped to floor.  This nurse picked up socks and AVS dropped by patient she was unable to reach.  Declined further needs and assistance offered.  Reports she is waiting to be picked up by her sister. ? ?4801:  Upon return to area, patient in floor beside wheelchair.  Shoes open toe sliders in floor.  This nurse removed patient socks and assisted four staff members (LPN, Pharmacy Tech, Research officer, political party) lift and assist patient return to wheelchair.  Monica Mccoy denies hitting any body parts or pain.  Answered her name, date of birth, location and situation.  Showing this nurse her port-a-cath site.  Denies pain.  Research staff member reports Monica Mccoy was standing, talking about door to "[address] hall being locked" took a step back, lost her footing and went straight down to her bottom, head may have slid down or hit column of wall near wheelchair storage area.  This nurse visually observed and palpated lower parietal and occipital areas of head when patient removed soft knitted cap.  No raised soft or firm areas or lumps visualized or felt.  No reaction to touch with palpitation.  Monica Mccoy continues to deny pain or tenderness. No redness, abrasions, bruising, cuts. open skin or skin tears noted to parietal or occipital areas.  Pleasantly smiling asking to be left alone. ? ?1450:  Dr. Lindi Adie notified of event.  No orders as patient without injury and speaking clearly.       ? ?1505:  Monica Mccoy informed this nurse spoke with provider.  No orders or interventions received at this time.  Advised to wear good soled, closed supportive shoes to reduce risk of falls.   ?1508 Noted change in communication.  Inappropriate responses to place (With Monica Mccoy), date (I am not sure), location (running up the street) and situation (I was released this morning).  Calling out to  persons in lobby she identifies as family, "that is my mother, niece", and more.  Requested for Nurse Tech to check vital signs.  Requested RRT, connected with Collaborative. ? ?~1510 VS: 97.9 - 60, O2 sat = 100 % , B/P = 148/125 rechecked B/P = 146/76 per N.T/ ?  ?1520 Connected with Monica Mccoy [phone]), patient's sister, informed of fall and possible ED transport.  "No.  I am four minutes away.  This is her normal, I believe from the brain radiation.  Do not move her."   RR nurse informed.  ? ?RR nurse B/P noted in range of ~ 156/116.  Monica Mccoy says she "Wants to go home.".       ? ?1524 Uncovered yellow arm band to right wrist under sleeve of patient sweater.  Pleasantly smiled saying "Do not take my S _ _ T".  Monica Mccoy continues seated in W/C, denies needs, awaiting sister's arrival with RR nurse and Psychiatric nurse, present at beginning of event. ?

## 2021-11-07 NOTE — Progress Notes (Deleted)
? ?[facility] [address]  ?[place], [place] [address] ?[phone] ? ? ?Interval Evaluation ? ?Date of Service: 11/07/21 ?Patient Name: Monica Mccoy ?Patient MRN: [id number] ?Patient DOB: March 06, 1966 ?Provider: Ventura Sellers, MD ? ?Identifying Statement:  ?Monica Mccoy is a 56 y.o. female with migraine headaches ? ?Primary Cancer: ? ?Oncologic History: ?Oncology History  ?Cancer of right breast metastatic to brain [facility])  ?06/13/2012 Initial Diagnosis  ? Right breast biopsy: Invasive ductal carcinoma, grade 3, HER-2 positive (3+), ER/PR negative, Ki67 5-10%, and in the right breast, high grade DCIS. BRCA gene testing was negative ?  ?08/06/2012 Surgery  ? Bilateral mastectomies:  ?Right breast: Invasive ductal carcinoma, 0.7cm, grade 2, with extensive DCIS, clear margins, 8/11 lymph nodes positive, measuring up to 3.8cm,  ?Left breast, no evidence of malignancy and 6 lymph nodes negative for carcinoma.  ?  ?2014 - 2015 Chemotherapy  ? adjuvant chemotherapy with 4 cycles of dose dense Adriamycin and Cytoxan followed by 4 cycles of Taxol Herceptin and then Herceptin maintenance. She underwent right chest radiation. ? ?  ? ?  ?12/24/2013 Relapse/Recurrence  ? Dizziness, headaches, ataxia, and memory loss. Head CT showed a 1.8cm and a 2.0cm frontal lobe masses. She underwent resection of both masses on 01/07/14,: Moderately differentiated carcinoma, likely of breast origin, HER-2 positive, ER/PR negative, CK7 positive. ?  ?2015 - 2015 Radiation Therapy  ? Received whole brain radiation ?  ? Chemotherapy  ? Taxotere, Herceptin and Perjeta, but Taxotere was discontinued after 4 cycles ? ?  ? ?  ?11/21/2020 -  Chemotherapy  ? Patient is on Treatment Plan : BREAST Trastuzumab q21d  ?   ? ?CNS Oncologic History ?2015: L frontal craniotomy followed by WBRT at [facility] in [place] ? ?Interval History: ? [name] presents today for headache follow up.  She continues to experience  frequent headaches, up to twice per week, but overall intensity is improved since starting the Depakote.  Dosing Tylenol or Tramadol during breakthrough events.  Currently has ankle injury and has been dosing Tramadol daily.  Today she also describes significant fatigue, sleepiness throughout the day.  Apparently she is a heavy snorer as well.  No other new complaints, continues on Herceptin with Dr. Lindi Adie. ? ?H+P (10/24/20) Patient presents today to review and assess neurologic complaints.  Much of history was provided by sister present at bedside.  She describes chronic short term memory loss, fatigue, and depression symptoms since undergoing whole brain radiation for metastatic breast cancer in 2015.  She also describes difficulty with communication, forming sentences and getting words out properly/fluidly.  Also describes twice weekly migraine headaches associated with nausea/vomiting, photophobia.  She will take a Tylenol or Tramodol late in the headache course, with minimal efficacy. Currently living with family in [place] after living most of life in [place] area.  Needs assistance with many ADLs because of cognitive impairment, physical disability secondary to chronic pain.  Her sleep pattern is very irregular.      ? ?Medications: ?Current Outpatient Medications on File Prior to Visit  ?Medication Sig Dispense Refill  ? acetaminophen (TYLENOL) 500 MG tablet Take 1,000 mg by mouth every 6 (six) hours as needed.    ? amLODipine (NORVASC) 5 MG tablet TAKE 1 TABLET (5 MG TOTAL) BY MOUTH DAILY. 90 tablet 3  ? divalproex (DEPAKOTE) 500 MG DR tablet Take 1 tablet (500 mg total) by mouth 2 (two) times daily. 60 tablet 3  ? DULoxetine (CYMBALTA)  60 MG capsule Take 1 capsule (60 mg total) by mouth 2 (two) times daily. 180 capsule 3  ? furosemide (LASIX) 20 MG tablet Take 1 tablet (20 mg total) by mouth daily. 30 tablet 3  ? gabapentin (NEURONTIN) 300 MG capsule Take 1 cap in AM, 1 cap in afternoon, 2 caps at  bedtime 120 capsule 11  ? MELATONIN GUMMIES PO Take 10 mg by mouth.    ? mirabegron ER (MYRBETRIQ) 50 MG TB24 tablet Take 1 tablet (50 mg total) by mouth daily. 90 tablet 1  ? traMADol (ULTRAM) 50 MG tablet Take 2 tablets (100 mg total) by mouth 2 (two) times daily as needed. 60 tablet 0  ? ?No current facility-administered medications on file prior to visit.  ? ? ?Allergies:  ?Allergies  ?Allergen Reactions  ? Dilaudid [Hydromorphone Hcl] Other (See Comments)  ?  Confusion & hallucinations  ? Morphine And Related Other (See Comments)  ?  HA  ? Penicillins   ? Tramadol   ? ?Past Medical History:  ?Past Medical History:  ?Diagnosis Date  ? Anxiety   ? Breast cancer metastasized to brain [facility])   ? Left breast  ? Chronic pain after cancer treatment   ? Depression   ? GERD (gastroesophageal reflux disease)   ? History of cancer chemotherapy   ? Breast cancer left breast  ? Seizure ([place])   ? ?Past Surgical History:  ?Past Surgical History:  ?Procedure Laterality Date  ? BRAIN SURGERY    ? brain tumor  ? MASTECTOMY Bilateral   ? ORIF ANKLE FRACTURE Right   ? ?Social History:  ?Social History  ? ?Socioeconomic History  ? Marital status: Divorced  ?  Spouse name: Not on file  ? Number of children: 3  ? Years of education: Not on file  ? Highest education level: Not on file  ?Occupational History  ? Occupation: Disabled  ?Tobacco Use  ? Smoking status: Never  ? Smokeless tobacco: Never  ?Vaping Use  ? Vaping Use: Never used  ?Substance and Sexual Activity  ? Alcohol use: Yes  ?  Comment: On occasion  ? Drug use: Yes  ?  Types: Marijuana  ?  Comment: Occasional use  ? Sexual activity: Not Currently  ?Other Topics Concern  ? Not on file  ?Social History Narrative  ? Right handed  ? Drinks caffeine  ? Two story home  ? ?Social Determinants of Health  ? ?Financial Resource Strain: Not on file  ?Food Insecurity: Not on file  ?Transportation Needs: Not on file  ?Physical Activity: Not on file  ?Stress: Not on file  ?Social  Connections: Not on file  ?Intimate Partner Violence: Not on file  ? ?Family History:  ?Family History  ?Problem Relation Age of Onset  ? Obesity Mother   ? Hypertension Mother   ? Hyperlipidemia Mother   ? Diabetes Mother   ? Heart disease Mother   ? ? ?Review of Systems: ?Constitutional: Doesn't report fevers, chills or abnormal weight loss ?Eyes: Doesn't report blurriness of vision ?Ears, nose, mouth, throat, and face: Doesn't report sore throat ?Respiratory: Doesn't report cough, dyspnea or wheezes ?Cardiovascular: Doesn't report palpitation, chest discomfort  ?Gastrointestinal:  Doesn't report nausea, constipation, diarrhea ?GU: Doesn't report incontinence ?Skin: Doesn't report skin rashes ?Neurological: Per HPI ?Musculoskeletal: Doesn't report joint pain ?Behavioral/Psych: Doesn't report anxiety ? ?Physical Exam: ?There were no vitals filed for this visit. ? ?KPS: 60. ?General: Alert, cooperative, pleasant, in no acute distress ?Head: Normal ?EENT:  No conjunctival injection or scleral icterus.  ?Lungs: Resp effort normal ?Cardiac: Regular rate ?Abdomen: Non-distended abdomen ?Skin: No rashes cyanosis or petechiae. ?Extremities: No clubbing or edema ? ?Neurologic Exam: ?Mental Status: Awake, alert, attentive to examiner. Oriented to self and environment. Language is noted for impairment in fluency, and frequent paraphasic errors.  Age advanced psychomotor slowing. ?Cranial Nerves: Visual acuity is grossly normal. Visual fields are full. Extra-ocular movements intact. No ptosis. Face is symmetric ?Motor: Tone and bulk are normal. Power is full in both arms and legs. Reflexes are symmetric, no pathologic reflexes present.  ?Sensory: Intact to light touch ?Gait: Deferred, orthopedic limitation ? ?Labs: ?I have reviewed the data as listed ?   ?Component Value Date/Time  ? NA 141 10/16/2021 1714  ? NA 139 12/15/2019 0000  ? K 3.5 10/16/2021 1714  ? CL 102 10/16/2021 1714  ? CO2 30 10/16/2021 1714  ? GLUCOSE 83  10/16/2021 1714  ? BUN 13 10/16/2021 1714  ? BUN 10 12/15/2019 0000  ? CREATININE 1.03 10/16/2021 1714  ? CREATININE 0.75 10/03/2021 1032  ? CALCIUM 10.0 10/16/2021 1714  ? PROT 7.2 10/03/2021 1032  ? ALBUMIN 3.8 02/07

## 2021-11-07 NOTE — Patient Instructions (Signed)
[facility]  Discharge Instructions: Thank you for choosing [facility] to provide your oncology and hematology care.   If you have a lab appointment with the Cancer Center, please go directly to the Cancer Center and check in at the registration area.   Wear comfortable clothing and clothing appropriate for easy access to any Portacath or PICC line.   We strive to give you quality time with your provider. You may need to reschedule your appointment if you arrive late (15 or more minutes).  Arriving late affects you and other patients whose appointments are after yours.  Also, if you miss three or more appointments without notifying the office, you may be dismissed from the clinic at the provider's discretion.      For prescription refill requests, have your pharmacy contact our office and allow 72 hours for refills to be completed.    Today you received the following chemotherapy and/or immunotherapy agent: Trastuzumab ([place])   To help prevent nausea and vomiting after your treatment, we encourage you to take your nausea medication as directed.  BELOW ARE SYMPTOMS THAT SHOULD BE REPORTED IMMEDIATELY: *FEVER GREATER THAN 100.4 F (38 C) OR HIGHER *CHILLS OR SWEATING *NAUSEA AND VOMITING THAT IS NOT CONTROLLED WITH YOUR NAUSEA MEDICATION *UNUSUAL SHORTNESS OF BREATH *UNUSUAL BRUISING OR BLEEDING *URINARY PROBLEMS (pain or burning when urinating, or frequent urination) *BOWEL PROBLEMS (unusual diarrhea, constipation, pain near the anus) TENDERNESS IN MOUTH AND THROAT WITH OR WITHOUT PRESENCE OF ULCERS (sore throat, sores in mouth, or a toothache) UNUSUAL RASH, SWELLING OR PAIN  UNUSUAL VAGINAL DISCHARGE OR ITCHING   Items with * indicate a potential emergency and should be followed up as soon as possible or go to the Emergency Department if any problems should occur.  Please show the CHEMOTHERAPY ALERT CARD or IMMUNOTHERAPY ALERT CARD at  check-in to the Emergency Department and triage nurse.  Should you have questions after your visit or need to cancel or reschedule your appointment, please contact [facility]  Dept: [phone]  and follow the prompts.  Office hours are 8:00 a.m. to 4:30 p.m. Monday - Friday. Please note that voicemails left after 4:00 p.m. may not be returned until the following business day.  We are closed weekends and major holidays. You have access to a nurse at all times for urgent questions. Please call the main number to the clinic Dept: [phone] and follow the prompts.   For any non-urgent questions, you may also contact your provider using MyChart. We now offer e-Visits for anyone 18 and older to request care online for non-urgent symptoms. For details visit [email or web address].   Also download the MyChart app! Go to the app store, search "MyChart", open the app, select , and log in with your MyChart username and password.  Due to Covid, a mask is required upon entering the hospital/clinic. If you do not have a mask, one will be given to you upon arrival. For doctor visits, patients may have 1 support person aged 18 or older with them. For treatment visits, patients cannot have anyone with them due to current Covid guidelines and our immunocompromised population.  

## 2021-11-13 ENCOUNTER — Telehealth: Payer: Self-pay | Admitting: *Deleted

## 2021-11-13 ENCOUNTER — Other Ambulatory Visit: Payer: Self-pay | Admitting: *Deleted

## 2021-11-13 DIAGNOSIS — Z8669 Personal history of other diseases of the nervous system and sense organs: Secondary | ICD-10-CM

## 2021-11-13 NOTE — Telephone Encounter (Signed)
Patients sister called concerned about increasing confusion and increased falls.  She stated that she was standing next to a trash can and was told to throw something away but just stood there acting like she didn't understand what to do.   ? ?Patient also feel in the lobby last week and was confused.  Rapid was called and patient was instructed to go to the emergency room but she refused to do this.  Upon getting home that day after the fall she vomited. ? ?Routing to provider to inquire if any further workup needs to be completed.  Last office visit was to review MRI and was recommended that next MRI be performed 1 year out from February 2023. ? ? ?

## 2021-11-13 NOTE — Telephone Encounter (Signed)
Lab order for Valproic Acid is placed.  Attempted to reach sister [name] to let her know plan.  Scheduling request sent. ?

## 2021-11-15 ENCOUNTER — Telehealth: Payer: Self-pay | Admitting: Internal Medicine

## 2021-11-15 NOTE — Telephone Encounter (Signed)
.  Called pt per 3/20 inbasket , Patient was unavailable, a message with appt time and date was left with number on file.   ?

## 2021-11-16 ENCOUNTER — Other Ambulatory Visit: Payer: Self-pay

## 2021-11-16 ENCOUNTER — Inpatient Hospital Stay (HOSPITAL_BASED_OUTPATIENT_CLINIC_OR_DEPARTMENT_OTHER): Payer: Medicare Other | Admitting: Internal Medicine

## 2021-11-16 ENCOUNTER — Inpatient Hospital Stay: Payer: Medicare Other

## 2021-11-16 VITALS — BP 123/95 | HR 75 | Temp 98.4°F | Resp 16

## 2021-11-16 DIAGNOSIS — T451X5A Adverse effect of antineoplastic and immunosuppressive drugs, initial encounter: Secondary | ICD-10-CM

## 2021-11-16 DIAGNOSIS — C50911 Malignant neoplasm of unspecified site of right female breast: Secondary | ICD-10-CM

## 2021-11-16 DIAGNOSIS — Z8669 Personal history of other diseases of the nervous system and sense organs: Secondary | ICD-10-CM

## 2021-11-16 DIAGNOSIS — G620 Drug-induced polyneuropathy: Secondary | ICD-10-CM | POA: Diagnosis not present

## 2021-11-16 DIAGNOSIS — Z5112 Encounter for antineoplastic immunotherapy: Secondary | ICD-10-CM | POA: Diagnosis not present

## 2021-11-16 DIAGNOSIS — G43009 Migraine without aura, not intractable, without status migrainosus: Secondary | ICD-10-CM

## 2021-11-16 DIAGNOSIS — G40209 Localization-related (focal) (partial) symptomatic epilepsy and epileptic syndromes with complex partial seizures, not intractable, without status epilepticus: Secondary | ICD-10-CM | POA: Diagnosis not present

## 2021-11-16 DIAGNOSIS — Z95828 Presence of other vascular implants and grafts: Secondary | ICD-10-CM

## 2021-11-16 LAB — VALPROIC ACID LEVEL: Valproic Acid Lvl: 110 ug/mL — ABNORMAL HIGH (ref 50.0–100.0)

## 2021-11-16 MED ORDER — HEPARIN SOD (PORK) LOCK FLUSH 100 UNIT/ML IV SOLN
500.0000 [IU] | Freq: Once | INTRAVENOUS | Status: AC | PRN
  Administered 2021-11-16: 500 [IU]

## 2021-11-16 MED ORDER — DIVALPROEX SODIUM 250 MG PO DR TAB: 250.0000 mg | DELAYED_RELEASE_TABLET | Freq: Two times a day (BID) | ORAL | 1 refills | Status: DC

## 2021-11-16 MED ORDER — SODIUM CHLORIDE 0.9% FLUSH
10.0000 mL | INTRAVENOUS | Status: DC | PRN
  Administered 2021-11-16: 10 mL

## 2021-11-16 MED ORDER — CIPROFLOXACIN HCL 500 MG PO TABS: 500.0000 mg | ORAL_TABLET | Freq: Two times a day (BID) | ORAL | 0 refills | Status: DC

## 2021-11-16 NOTE — Progress Notes (Signed)
? ?[facility] [address]  ?[place], [place] [address] ?[phone] ? ? ?Interval Evaluation ? ?Date of Service: 11/16/21 ?Patient Name: Monica Mccoy ?Patient MRN: [id number] ?Patient DOB: [birth year] ?Provider: Ventura Sellers, MD ? ?Identifying Statement:  ?Monica Mccoy is a 56 y.o. female with migraine headaches ? ?Primary Cancer: ? ?Oncologic History: ?Oncology History  ?Cancer of right breast metastatic to brain [facility])  ?06/13/2012 Initial Diagnosis  ? Right breast biopsy: Invasive ductal carcinoma, grade 3, HER-2 positive (3+), ER/PR negative, Ki67 5-10%, and in the right breast, high grade DCIS. BRCA gene testing was negative ?  ?08/06/2012 Surgery  ? Bilateral mastectomies:  ?Right breast: Invasive ductal carcinoma, 0.7cm, grade 2, with extensive DCIS, clear margins, 8/11 lymph nodes positive, measuring up to 3.8cm,  ?Left breast, no evidence of malignancy and 6 lymph nodes negative for carcinoma.  ?  ?2014 - 2015 Chemotherapy  ? adjuvant chemotherapy with 4 cycles of dose dense Adriamycin and Cytoxan followed by 4 cycles of Taxol Herceptin and then Herceptin maintenance. She underwent right chest radiation. ? ?  ? ?  ?12/24/2013 Relapse/Recurrence  ? Dizziness, headaches, ataxia, and memory loss. Head CT showed a 1.8cm and a 2.0cm frontal lobe masses. She underwent resection of both masses on 01/07/14,: Moderately differentiated carcinoma, likely of breast origin, HER-2 positive, ER/PR negative, CK7 positive. ?  ?2015 - 2015 Radiation Therapy  ? Received whole brain radiation ?  ? Chemotherapy  ? Taxotere, Herceptin and Perjeta, but Taxotere was discontinued after 4 cycles ? ?  ? ?  ?11/21/2020 -  Chemotherapy  ? Patient is on Treatment Plan : BREAST Trastuzumab q21d  ?   ? ?CNS Oncologic History ?2015: L frontal craniotomy followed by WBRT at [facility] in [place] ? ?Interval History: ? Monica Mccoy presents today for clinical follow up given recent confusion  episodes.  Sister describes gradual increase in disorientation, inappropriate responses.  She has been increasingly needing of help with ADLs.  Headaches are still present.  She does acknowledge burning, pain, urgency and foul smell with urine over past 2 weeks. ? ?H+P (10/24/20) Patient presents today to review and assess neurologic complaints.  Much of history was provided by sister present at bedside.  She describes chronic short term memory loss, fatigue, and depression symptoms since undergoing whole brain radiation for metastatic breast cancer in 2015.  She also describes difficulty with communication, forming sentences and getting words out properly/fluidly.  Also describes twice weekly migraine headaches associated with nausea/vomiting, photophobia.  She will take a Tylenol or Tramodol late in the headache course, with minimal efficacy. Currently living with family in [place] after living most of life in [place] area.  Needs assistance with many ADLs because of cognitive impairment, physical disability secondary to chronic pain.  Her sleep pattern is very irregular.      ? ?Medications: ?Current Outpatient Medications on File Prior to Visit  ?Medication Sig Dispense Refill  ? acetaminophen (TYLENOL) 500 MG tablet Take 1,000 mg by mouth every 6 (six) hours as needed.    ? amLODipine (NORVASC) 5 MG tablet TAKE 1 TABLET (5 MG TOTAL) BY MOUTH DAILY. 90 tablet 3  ? divalproex (DEPAKOTE) 500 MG DR tablet Take 1 tablet (500 mg total) by mouth 2 (two) times daily. 60 tablet 3  ? DULoxetine (CYMBALTA) 60 MG capsule Take 1 capsule (60 mg total) by mouth 2 (two) times daily. 180 capsule 3  ? furosemide (LASIX) 20 MG tablet Take 1  tablet (20 mg total) by mouth daily. 30 tablet 3  ? gabapentin (NEURONTIN) 300 MG capsule Take 1 cap in AM, 1 cap in afternoon, 2 caps at bedtime 120 capsule 11  ? MELATONIN GUMMIES PO Take 10 mg by mouth.    ? mirabegron ER (MYRBETRIQ) 50 MG TB24 tablet Take 1 tablet (50 mg total) by mouth  daily. 90 tablet 1  ? traMADol (ULTRAM) 50 MG tablet Take 2 tablets (100 mg total) by mouth 2 (two) times daily as needed. 60 tablet 0  ? ?Current Facility-Administered Medications on File Prior to Visit  ?Medication Dose Route Frequency Provider Last Rate Last Admin  ? sodium chloride flush (NS) 0.9 % injection 10 mL  10 mL Intracatheter PRN Nicholas Lose, MD   10 mL at 11/16/21 1112  ? ? ?Allergies:  ?Allergies  ?Allergen Reactions  ? Dilaudid [Hydromorphone Hcl] Other (See Comments)  ?  Confusion & hallucinations  ? Morphine And Related Other (See Comments)  ?  HA  ? Penicillins   ? Tramadol   ? ?Past Medical History:  ?Past Medical History:  ?Diagnosis Date  ? Anxiety   ? Breast cancer metastasized to brain [facility])   ? Left breast  ? Chronic pain after cancer treatment   ? Depression   ? GERD (gastroesophageal reflux disease)   ? History of cancer chemotherapy   ? Breast cancer left breast  ? Seizure ([place])   ? ?Past Surgical History:  ?Past Surgical History:  ?Procedure Laterality Date  ? BRAIN SURGERY    ? brain tumor  ? MASTECTOMY Bilateral   ? ORIF ANKLE FRACTURE Right   ? ?Social History:  ?Social History  ? ?Socioeconomic History  ? Marital status: Divorced  ?  Spouse name: Not on file  ? Number of children: 3  ? Years of education: Not on file  ? Highest education level: Not on file  ?Occupational History  ? Occupation: Disabled  ?Tobacco Use  ? Smoking status: Never  ? Smokeless tobacco: Never  ?Vaping Use  ? Vaping Use: Never used  ?Substance and Sexual Activity  ? Alcohol use: Yes  ?  Comment: On occasion  ? Drug use: Yes  ?  Types: Marijuana  ?  Comment: Occasional use  ? Sexual activity: Not Currently  ?Other Topics Concern  ? Not on file  ?Social History Narrative  ? Right handed  ? Drinks caffeine  ? Two story home  ? ?Social Determinants of Health  ? ?Financial Resource Strain: Not on file  ?Food Insecurity: Not on file  ?Transportation Needs: Not on file  ?Physical Activity: Not on file  ?Stress:  Not on file  ?Social Connections: Not on file  ?Intimate Partner Violence: Not on file  ? ?Family History:  ?Family History  ?Problem Relation Age of Onset  ? Obesity Mother   ? Hypertension Mother   ? Hyperlipidemia Mother   ? Diabetes Mother   ? Heart disease Mother   ? ? ?Review of Systems: ?Constitutional: Doesn't report fevers, chills or abnormal weight loss ?Eyes: Doesn't report blurriness of vision ?Ears, nose, mouth, throat, and face: Doesn't report sore throat ?Respiratory: Doesn't report cough, dyspnea or wheezes ?Cardiovascular: Doesn't report palpitation, chest discomfort  ?Gastrointestinal:  Doesn't report nausea, constipation, diarrhea ?GU: Doesn't report incontinence ?Skin: Doesn't report skin rashes ?Neurological: Per HPI ?Musculoskeletal: Doesn't report joint pain ?Behavioral/Psych: Doesn't report anxiety ? ?Physical Exam: ?Vitals:  ? 11/16/21 1147  ?BP: (!) 123/95  ?Pulse: 75  ?Resp:  16  ?Temp: 98.4 ?F (36.9 ?C)  ?SpO2: 100%  ? ? ?KPS: 60. ?General: Alert, cooperative, pleasant, in no acute distress ?Head: Normal ?EENT: No conjunctival injection or scleral icterus.  ?Lungs: Resp effort normal ?Cardiac: Regular rate ?Abdomen: Non-distended abdomen ?Skin: No rashes cyanosis or petechiae. ?Extremities: No clubbing or edema ? ?Neurologic Exam: ?Mental Status: Awake, alert, attentive to examiner. Oriented to self and environment. Language is noted for impairment in fluency, and frequent paraphasic errors.  Age advanced psychomotor slowing. ?Cranial Nerves: Visual acuity is grossly normal. Visual fields are full. Extra-ocular movements intact. No ptosis. Face is symmetric ?Motor: Tone and bulk are normal. Power is full in both arms and legs. Reflexes are symmetric, no pathologic reflexes present.  ?Sensory: Intact to light touch ?Gait: Deferred, orthopedic limitation ? ?Labs: ?I have reviewed the data as listed ?   ?Component Value Date/Time  ? NA 140 11/07/2021 1102  ? NA 139 12/15/2019 0000  ? K 3.2  (L) 11/07/2021 1102  ? CL 102 11/07/2021 1102  ? CO2 30 11/07/2021 1102  ? GLUCOSE 104 (H) 11/07/2021 1102  ? BUN 13 11/07/2021 1102  ? BUN 10 12/15/2019 0000  ? CREATININE 0.80 11/07/2021 1102  ? CALCIUM 8.6 (L)

## 2021-11-17 ENCOUNTER — Telehealth: Payer: Self-pay | Admitting: Internal Medicine

## 2021-11-17 NOTE — Telephone Encounter (Signed)
Scheduled per 3/23 los, pt's sister has been called and confirmed  ?

## 2021-11-20 ENCOUNTER — Ambulatory Visit (HOSPITAL_BASED_OUTPATIENT_CLINIC_OR_DEPARTMENT_OTHER): Payer: Medicare Other | Admitting: Internal Medicine

## 2021-11-23 ENCOUNTER — Other Ambulatory Visit: Payer: Self-pay | Admitting: Hematology and Oncology

## 2021-11-23 MED ORDER — TRAMADOL HCL 50 MG PO TABS: 100.0000 mg | ORAL_TABLET | Freq: Two times a day (BID) | ORAL | 0 refills | Status: DC | PRN

## 2021-11-25 ENCOUNTER — Encounter: Payer: Self-pay | Admitting: Hematology and Oncology

## 2021-11-27 ENCOUNTER — Telehealth: Payer: Self-pay | Admitting: Family Medicine

## 2021-11-27 ENCOUNTER — Ambulatory Visit: Payer: Medicare Other | Admitting: Family Medicine

## 2021-11-27 NOTE — Telephone Encounter (Signed)
Pt's sister is having a hard time getting her out of bed for her appointment with Dr. Gena Fray today 11/27/21 for an OV. I have not sent her a no show letter, not sure if I should.  ?

## 2021-11-28 ENCOUNTER — Emergency Department (HOSPITAL_COMMUNITY): Payer: Medicare Other

## 2021-11-28 ENCOUNTER — Other Ambulatory Visit: Payer: Self-pay

## 2021-11-28 ENCOUNTER — Ambulatory Visit: Payer: Medicare Other | Admitting: Hematology and Oncology

## 2021-11-28 ENCOUNTER — Ambulatory Visit: Payer: Medicare Other

## 2021-11-28 ENCOUNTER — Emergency Department (HOSPITAL_COMMUNITY)
Admission: EM | Admit: 2021-11-28 | Discharge: 2021-11-28 | Disposition: A | Discharge status: Home / Self Care | Payer: Medicare Other | Attending: Emergency Medicine | Admitting: Emergency Medicine

## 2021-11-28 ENCOUNTER — Other Ambulatory Visit: Payer: Medicare Other

## 2021-11-28 ENCOUNTER — Encounter (HOSPITAL_COMMUNITY): Payer: Self-pay | Admitting: Emergency Medicine

## 2021-11-28 DIAGNOSIS — R109 Unspecified abdominal pain: Secondary | ICD-10-CM | POA: Diagnosis not present

## 2021-11-28 DIAGNOSIS — R410 Disorientation, unspecified: Secondary | ICD-10-CM

## 2021-11-28 DIAGNOSIS — Z853 Personal history of malignant neoplasm of breast: Secondary | ICD-10-CM | POA: Insufficient documentation

## 2021-11-28 DIAGNOSIS — R531 Weakness: Secondary | ICD-10-CM | POA: Diagnosis present

## 2021-11-28 DIAGNOSIS — R4182 Altered mental status, unspecified: Secondary | ICD-10-CM | POA: Insufficient documentation

## 2021-11-28 LAB — COMPREHENSIVE METABOLIC PANEL
ALT: 8 U/L (ref 0–44)
AST: 14 U/L — ABNORMAL LOW (ref 15–41)
Albumin: 3.5 g/dL (ref 3.5–5.0)
Alkaline Phosphatase: 69 U/L (ref 38–126)
Anion gap: 5 (ref 5–15)
BUN: 10 mg/dL (ref 6–20)
CO2: 30 mmol/L (ref 22–32)
Calcium: 8.3 mg/dL — ABNORMAL LOW (ref 8.9–10.3)
Chloride: 104 mmol/L (ref 98–111)
Creatinine, Ser: 0.7 mg/dL (ref 0.44–1.00)
GFR, Estimated: >60 mL/min (ref >60)
Glucose, Bld: 84 mg/dL (ref 70–99)
Potassium: 3.2 mmol/L — ABNORMAL LOW (ref 3.5–5.1)
Sodium: 139 mmol/L (ref 135–145)
Total Bilirubin: 0.6 mg/dL (ref 0.3–1.2)
Total Protein: 7 g/dL (ref 6.5–8.1)

## 2021-11-28 LAB — CBC WITH DIFFERENTIAL/PLATELET
Abs Immature Granulocytes: 0.01 10*3/uL (ref 0.00–0.07)
Basophils Absolute: 0 10*3/uL (ref 0.0–0.1)
Basophils Relative: 1 %
Eosinophils Absolute: 0.1 10*3/uL (ref 0.0–0.5)
Eosinophils Relative: 2 %
HCT: 37 % (ref 36.0–46.0)
Hemoglobin: 11.8 g/dL — ABNORMAL LOW (ref 12.0–15.0)
Immature Granulocytes: 0 %
Lymphocytes Relative: 23 %
Lymphs Abs: 1.1 10*3/uL (ref 0.7–4.0)
MCH: 29.8 pg (ref 26.0–34.0)
MCHC: 31.9 g/dL (ref 30.0–36.0)
MCV: 93.4 fL (ref 80.0–100.0)
Monocytes Absolute: 0.9 10*3/uL (ref 0.1–1.0)
Monocytes Relative: 20 %
Neutro Abs: 2.5 10*3/uL (ref 1.7–7.7)
Neutrophils Relative %: 54 %
Platelets: 165 10*3/uL (ref 150–400)
RBC: 3.96 MIL/uL (ref 3.87–5.11)
RDW: 13.1 % (ref 11.5–15.5)
WBC: 4.7 10*3/uL (ref 4.0–10.5)
nRBC: 0 % (ref 0.0–0.2)

## 2021-11-28 LAB — URINALYSIS, ROUTINE W REFLEX MICROSCOPIC
Bilirubin Urine: NEGATIVE
Glucose, UA: NEGATIVE mg/dL
Hgb urine dipstick: NEGATIVE
Ketones, ur: NEGATIVE mg/dL
Leukocytes,Ua: NEGATIVE
Nitrite: NEGATIVE
Protein, ur: NEGATIVE mg/dL
Specific Gravity, Urine: 1.005 (ref 1.005–1.030)
pH: 9 — ABNORMAL HIGH (ref 5.0–8.0)

## 2021-11-28 IMAGING — DX DG CHEST 1V PORT
1 series · 1 of 1 positions shown · non-contrast
Comparison: [DATE], [DATE]

CLINICAL DATA: Altered level of consciousness

EXAM:
PORTABLE CHEST 1 VIEW

[chest ap]
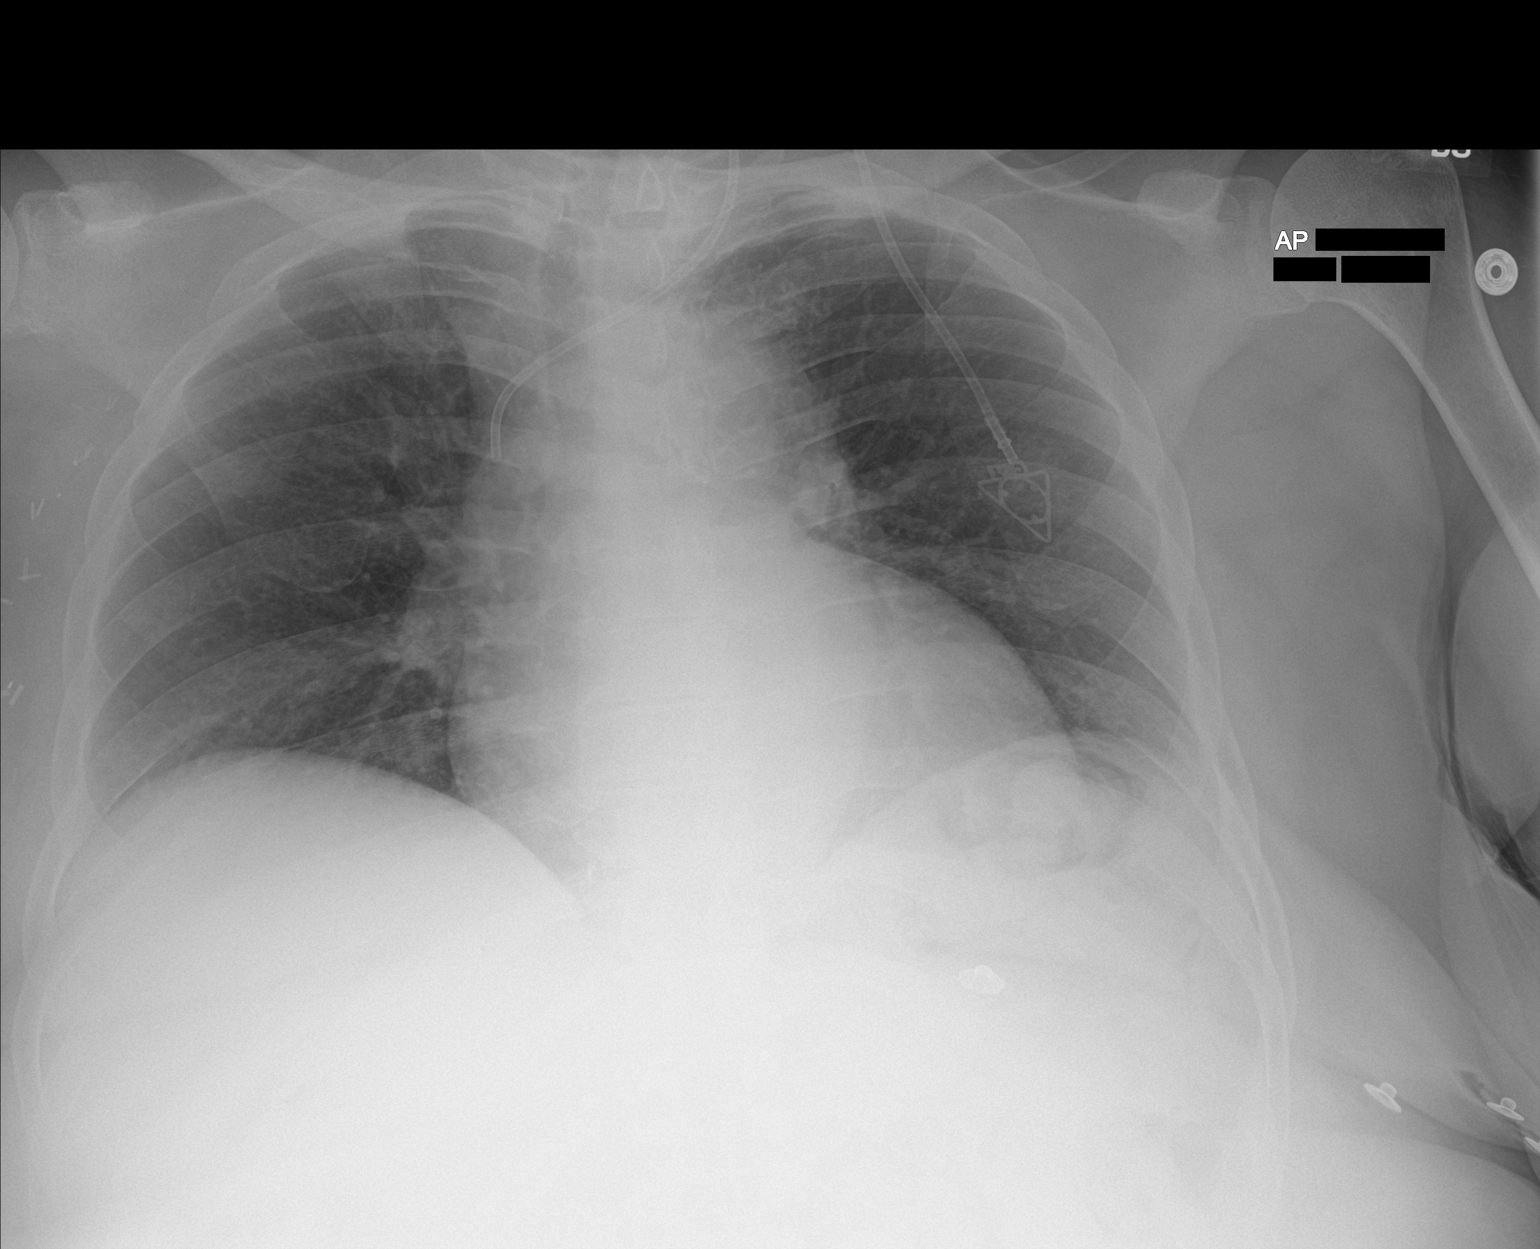

[1 of 1 positions shown; findings below may reference images not displayed]

FINDINGS: Left chest port is stable in positioning. Heart size within normal
limits. No focal airspace consolidation, pleural effusion, or
pneumothorax. Surgical clips in the right axilla.
IMPRESSION: No active disease.

## 2021-11-28 IMAGING — MR MR HEAD WO/W CM
11 of 15 series · 27 of 48 positions shown · IV contrast (gadavist)
Comparison: [DATE].  [DATE].

CLINICAL DATA: Neuro deficit, acute, stroke suspected. Metastatic
disease evaluation. Altered mental status over the last few days.
History of breast cancer.

EXAM:
MRI HEAD WITHOUT AND WITH CONTRAST
TECHNIQUE: Multiplanar, multiecho pulse sequences of the brain and surrounding
structures were obtained without and with intravenous contrast.
CONTRAST:  10mL GADAVIST GADOBUTROL 1 MMOL/ML IV SOLN

[Series 7: T2 · sagittal · 5.0mm · 0.47mm/px · 1 of 24 slices shown (1 of 2)]
[im 1/24]
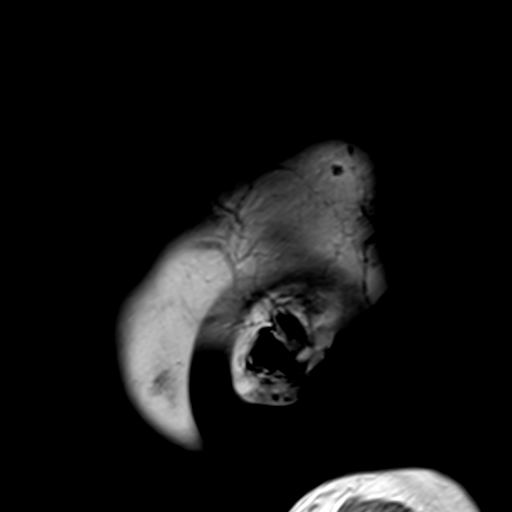

[Series 8: T2 · axial · 5.0mm · 0.45mm/px · 1 of 24 slices shown (2 of 2)]
[im 1/24]
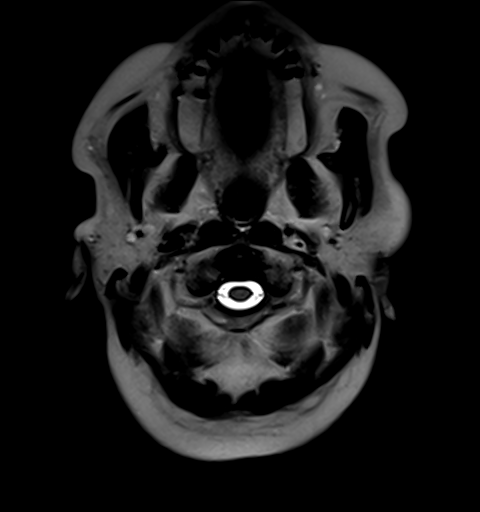

[Series 9: FLAIR · axial · 3.0mm · 0.43mm/px · z∈[-48,+94]mm · 4 of 51 slices shown]
[im 1/51]
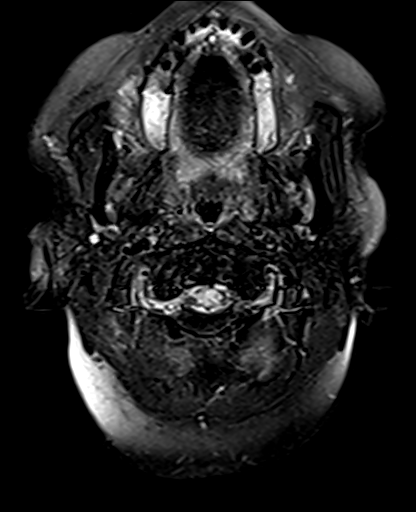
[im 17/51]
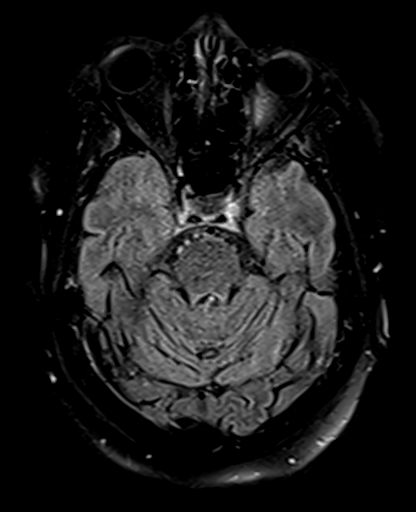
[im 34/51]
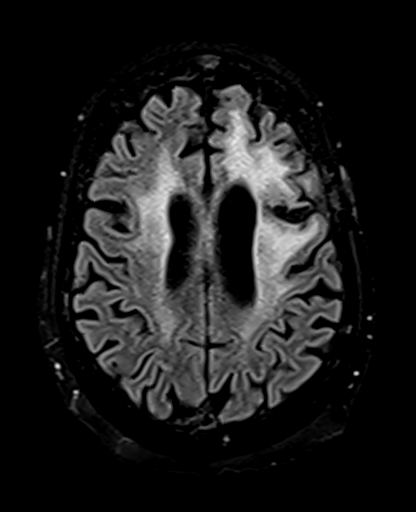
[im 51/51]
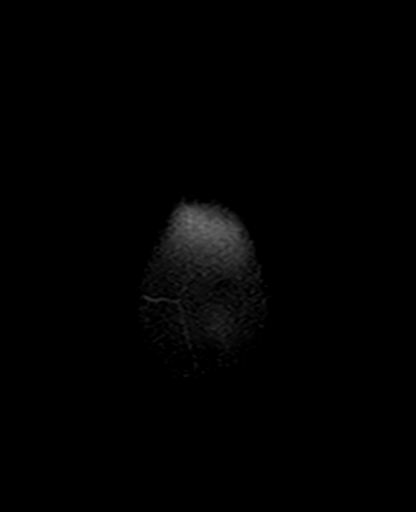

[Series 10: GRE · axial · 3.0mm · 0.45mm/px · 1 of 49 slices shown]
[im 1/49]
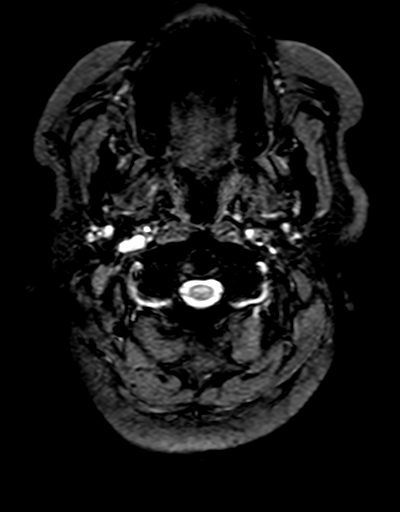

[Series 13: DWI · coronal · 5.0mm · 1.31mm/px · 4 of 48 slices shown (1 of 2)]
[im 1/48]
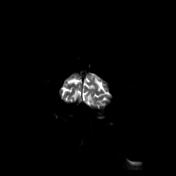
[im 16/48]
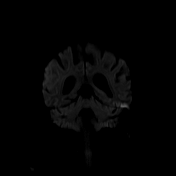
[im 32/48]
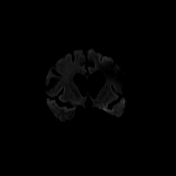
[im 48/48]
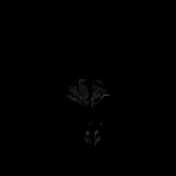

[Series 14: DWI · coronal · 5.0mm · 1.31mm/px · 2 of 24 slices shown (2 of 2)]
[im 1/24]
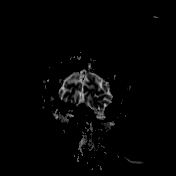
[im 24/24]
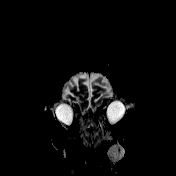

[Series 15: T2 post-contrast · coronal · 5.0mm · 0.43mm/px · 2 of 24 slices shown]
[im 1/24]
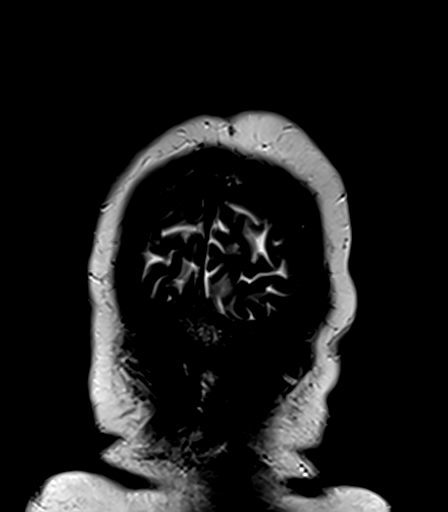
[im 24/24]
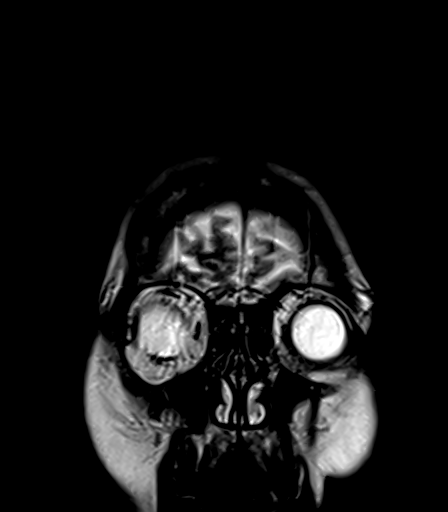

[Series 16: T1 post-contrast · axial · 3.0mm · 0.45mm/px · z∈[-50,+92]mm · 4 of 51 slices shown (1 of 4)]
[im 1/51]
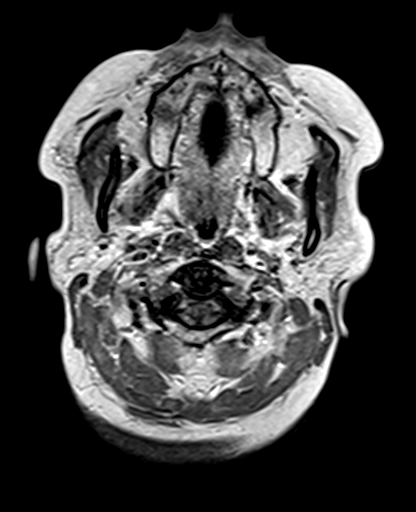
[im 17/51]
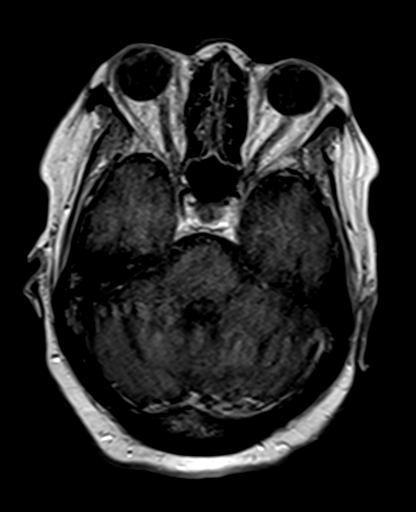
[im 34/51]
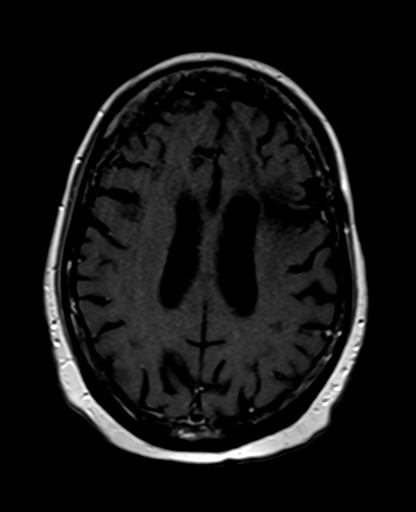
[im 51/51]
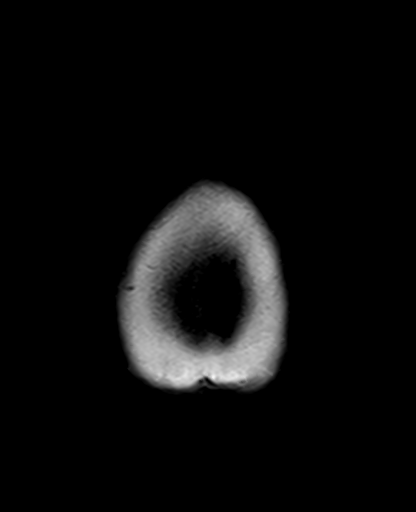

[Series 17: T1 post-contrast · coronal · 5.0mm · 0.43mm/px · 2 of 24 slices shown (2 of 4)]
[im 1/24]
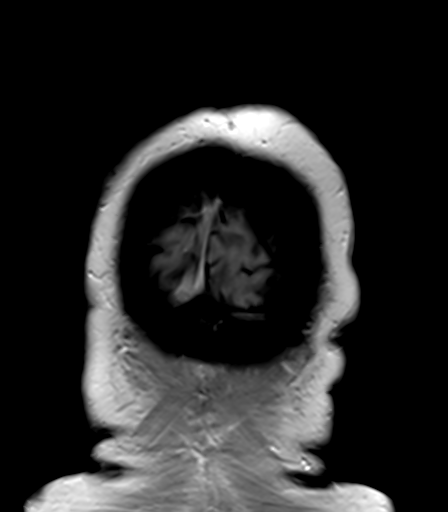
[im 24/24]
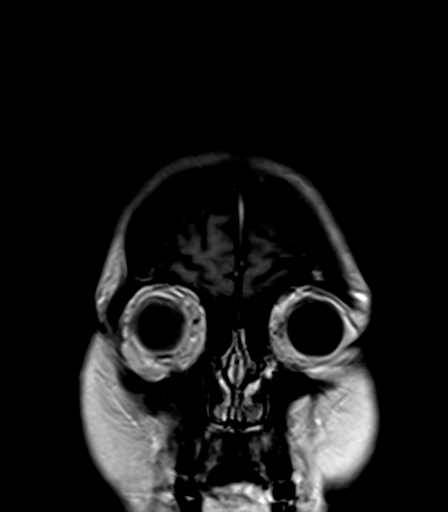

[Series 18: T1 post-contrast · sagittal · 5.0mm · 0.47mm/px · 2 of 24 slices shown (3 of 4)]
[im 1/24]
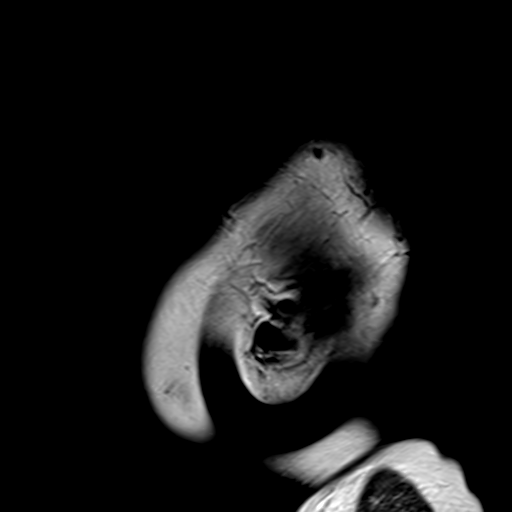
[im 24/24]
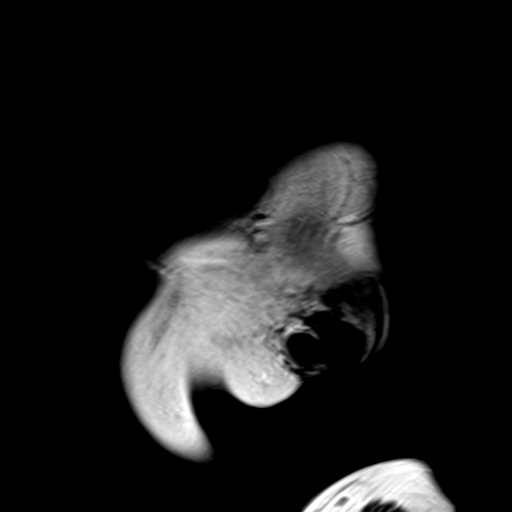

[Series 19: T1 post-contrast · axial · 3.0mm · 0.45mm/px · z∈[-49,+93]mm · 4 of 51 slices shown (4 of 4)]
[im 1/51]
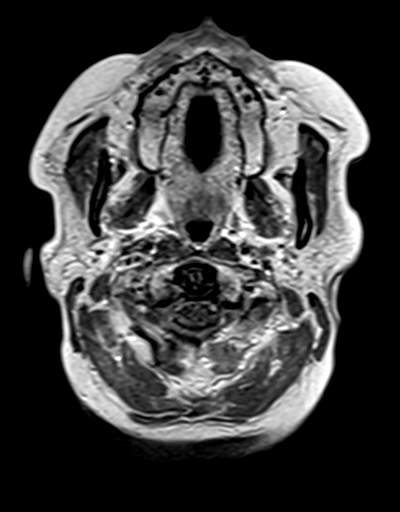
[im 17/51]
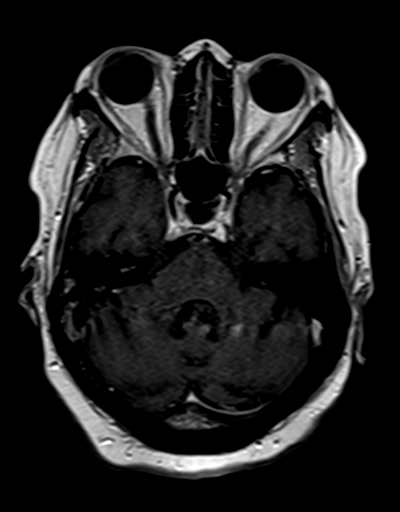
[im 34/51]
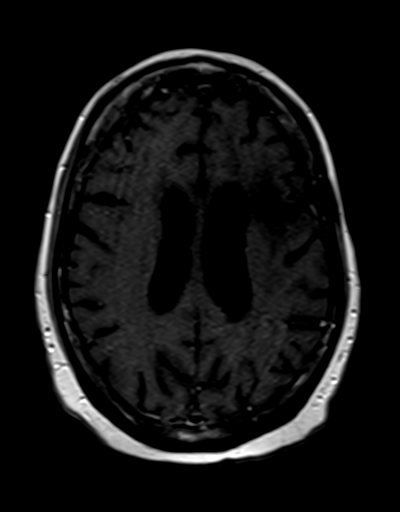
[im 51/51]
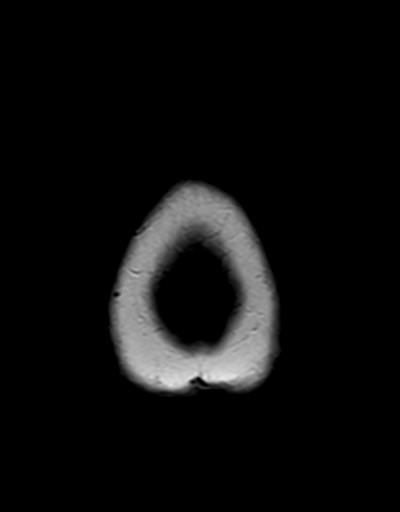

[27 of 48 positions shown; findings below may reference images not displayed]

FINDINGS: Brain: Diffusion imaging does not show any acute or subacute
infarction or other cause of restricted diffusion. No focal
abnormality affects the brainstem or cerebellum. Right cerebral
hemisphere shows diffuse abnormal T2 signal, particularly
anteriorly, likely related to previous radiation. Left hemisphere
shows evidence of multiple previous frontal craniotomy with
postsurgical changes of the underlying left frontal lobe. Areas of
encephalomalacia and volume loss with widespread abnormal T2 signal
in the white matter likely related to previous radiation. Old foci
of hemosiderin deposition in the cerebellum and cerebral hemispheres
are unchanged. No evidence of residual or recurrent mass lesion. No
abnormal contrast enhancement. No hydrocephalus or subdural
collection.

Vascular: There is atherosclerotic calcification of the major
vessels at the base of the brain.

Skull and upper cervical spine: Negative

Sinuses/Orbits: Clear/normal

Other: None
IMPRESSION: No change. No evidence of residual, recurrent or new metastatic
disease. Postsurgical changes in the left frontal lobe. Regional
gliosis and volume loss. More diffuse abnormal T2 white matter
signal likely relates to previous radiation.

## 2021-11-28 MED ORDER — SODIUM CHLORIDE 0.9 % IV BOLUS
1000.0000 mL | Freq: Once | INTRAVENOUS | Status: AC
  Administered 2021-11-28: 1000 mL via INTRAVENOUS

## 2021-11-28 MED ORDER — GADOBUTROL 1 MMOL/ML IV SOLN
10.0000 mL | Freq: Once | INTRAVENOUS | Status: AC | PRN
  Administered 2021-11-28: 10 mL via INTRAVENOUS

## 2021-11-28 MED ORDER — POTASSIUM CHLORIDE 20 MEQ PO PACK
40.0000 meq | PACK | Freq: Once | ORAL | Status: AC
  Administered 2021-11-28: 40 meq via ORAL
  Filled 2021-11-28: qty 2

## 2021-11-28 MED ORDER — HEPARIN SOD (PORK) LOCK FLUSH 100 UNIT/ML IV SOLN
500.0000 [IU] | Freq: Once | INTRAVENOUS | Status: AC
  Administered 2021-11-28: 500 [IU]
  Filled 2021-11-28: qty 5

## 2021-11-28 NOTE — ED Provider Notes (Signed)
Patient presenting for progressive altered mental status at home.  She does have a history of cancer with metastases to brain.  MRI shows no progression.  She has had improvement in her mentation while here in the ED.  Currently awaiting urinalysis.  Reassess and speak to family prior to disposition. ?Physical Exam  ?BP (!) 172/107   Pulse (!) 53   Temp 98.3 ?F (36.8 ?C) (Oral)   Resp 18   SpO2 (!) 81%  ? ?Physical Exam ?Vitals and nursing note reviewed.  ?Constitutional:   ?   General: She is not in acute distress. ?   Appearance: Normal appearance. She is well-developed. She is not ill-appearing, toxic-appearing or diaphoretic.  ?HENT:  ?   Head: Normocephalic and atraumatic.  ?   Mouth/Throat:  ?   Mouth: Mucous membranes are moist.  ?   Pharynx: Oropharynx is clear.  ?Eyes:  ?   Extraocular Movements: Extraocular movements intact.  ?   Conjunctiva/sclera: Conjunctivae normal.  ?Cardiovascular:  ?   Rate and Rhythm: Normal rate and regular rhythm.  ?   Heart sounds: No murmur heard. ?Pulmonary:  ?   Effort: Pulmonary effort is normal. No respiratory distress.  ?Abdominal:  ?   Palpations: Abdomen is soft.  ?   Tenderness: There is no abdominal tenderness.  ?Musculoskeletal:     ?   General: No swelling. Normal range of motion.  ?   Cervical back: Normal range of motion and neck supple.  ?Skin: ?   General: Skin is warm and dry.  ?Neurological:  ?   General: No focal deficit present.  ?   Mental Status: She is alert. She is disoriented.  ?Psychiatric:     ?   Mood and Affect: Mood normal.     ?   Behavior: Behavior normal.  ? ? ?Procedures  ?Procedures ? ?ED Course / MDM  ?  ?Medical Decision Making ?Amount and/or Complexity of Data Reviewed ?Labs: ordered. ?Radiology: ordered. ? ?Risk ?Prescription drug management. ? ? ?Results of urinalysis showed no acute infection.  On assessment, patient resting in bed.  She denies any current physical complaints.  She is oriented only to self.  When asked questions,  patient struggles to respond.  I spoke with her sister over the phone.  Patient currently lives with her sister.  Patient's sister reports that the patient's confusion and disorientation has been a chronic issue.  She reports that she was last normal a year ago.  Patient sister does feel that patient has had progressive confusion, difficulty concentrating, unsteadiness for several weeks.  On review of EMR, the same symptoms were described on previous outpatient visits.  Because of the symptoms, patient has undergone a recent decrease in her Depakote dosing.  She does not appear to be on other medications which may be exacerbating the symptoms.  Sister was unable to describe any new symptoms other than patient urinating several times earlier today.  It appears that this is why the patient was brought to the ER.  Patient's sister was informed of the reassuring work-up results today, including an MRI showing no acute changes.  She does feel comfortable with discharge home and outpatient follow-up.  Patient's sister to come pick her up from the ER.  Patient was discharged in stable condition. ? ? ? ? ?  ?Godfrey Pick, MD ?11/29/21 5916 ? ?

## 2021-11-28 NOTE — ED Notes (Signed)
All bedding changed, new gown given, purewick in place at this time ? ?

## 2021-11-28 NOTE — ED Provider Notes (Signed)
?[facility] DEPT ?Provider Note ? ? ?CSN: [id number] ?Arrival date & time: 11/28/21  1137 ? ?  ? ?History ? ?Chief Complaint  ?Patient presents with  ? Altered Mental Status  ? Weakness  ? ? ?Monica Mccoy is a 56 y.o. female. ? ?56 year old female presents to ED with altered mental status. She is by herself and is not a good historian. When asked about HPI/PMH, she responds "I don't know." No evidence of baseline. EMS reported family noticed increased AMS for the past 2-3 days. She was due for an infusion today but too weak to go.  ? ?PMHx significant for breast cancer, migraine, craniotomy 2015, currently receiving chemotherapy.  ? ? ?Altered Mental Status ?Weakness ? ?  ? ?Home Medications ?Prior to Admission medications   ?Medication Sig Start Date End Date Taking? Authorizing Provider  ?acetaminophen (TYLENOL) 500 MG tablet Take 1,000 mg by mouth every 6 (six) hours as needed.    [provider]  ?amLODipine (NORVASC) 5 MG tablet TAKE 1 TABLET (5 MG TOTAL) BY MOUTH DAILY. 10/18/21   Haydee Salter, MD  ?ciprofloxacin (CIPRO) 500 MG tablet Take 1 tablet (500 mg total) by mouth 2 (two) times daily. 11/16/21   Ventura Sellers, MD  ?divalproex (DEPAKOTE) 250 MG DR tablet Take 1 tablet (250 mg total) by mouth 2 (two) times daily. 11/16/21   Ventura Sellers, MD  ?DULoxetine (CYMBALTA) 60 MG capsule Take 1 capsule (60 mg total) by mouth 2 (two) times daily. 10/16/21   Haydee Salter, MD  ?furosemide (LASIX) 20 MG tablet Take 1 tablet (20 mg total) by mouth daily. 09/11/21   Haydee Salter, MD  ?gabapentin (NEURONTIN) 300 MG capsule Take 1 cap in AM, 1 cap in afternoon, 2 caps at bedtime 06/07/21   Cameron Sprang, MD  ?MELATONIN GUMMIES PO Take 10 mg by mouth.    [provider]  ?mirabegron ER (MYRBETRIQ) 50 MG TB24 tablet Take 1 tablet (50 mg total) by mouth daily. 05/21/21   Regalado, Belkys A, MD  ?traMADol (ULTRAM) 50 MG tablet Take 2 tablets (100 mg total) by mouth  2 (two) times daily as needed. 11/23/21   Nicholas Lose, MD  ?   ? ?Allergies    ?Dilaudid [hydromorphone hcl], Morphine and related, Penicillins, and Tramadol   ? ?Review of Systems   ?Review of Systems  ?Reason unable to perform ROS: Altered mental state. ROS significant for weakness, generalized myalgias. Patient responded to everything else with "I don't know"  ?Constitutional:   ?     Excessive lethargy that has progressively worsened  ? ?Physical Exam ?Updated Vital Signs ?BP (!) 172/107   Pulse (!) 53   Temp 98.3 ?F (36.8 ?C) (Oral)   Resp 18   SpO2 (!) 81%  ?Physical Exam ?Vitals and nursing note reviewed. Exam conducted with a chaperone present.  ?Constitutional:   ?   Appearance: Normal appearance. She is obese. She is ill-appearing.  ?HENT:  ?   Head: Normocephalic and atraumatic. No raccoon eyes, Battle's sign, abrasion, contusion, right periorbital erythema or left periorbital erythema.  ?   Jaw: There is normal jaw occlusion. No tenderness or swelling.  ?   Right Ear: Hearing, tympanic membrane, ear canal and external ear normal.  ?   Left Ear: Hearing, tympanic membrane, ear canal and external ear normal.  ?   Nose: Nose normal. No nasal deformity, septal deviation or signs of injury.  ?  Mouth/Throat:  ?   Lips: Pink. No lesions.  ?   Mouth: Mucous membranes are moist.  ?   Tongue: No lesions. Tongue does not deviate from midline.  ?   Pharynx: Oropharynx is clear. No oropharyngeal exudate or posterior oropharyngeal erythema.  ?   Tonsils: No tonsillar exudate or tonsillar abscesses.  ?Eyes:  ?   General: Lids are normal. No scleral icterus.    ?   Right eye: No discharge.     ?   Left eye: No discharge.  ?   Conjunctiva/sclera: Conjunctivae normal.  ?   Pupils: Pupils are equal, round, and reactive to light.  ?Neck:  ?   Trachea: Trachea and phonation normal.  ?Cardiovascular:  ?   Rate and Rhythm: Normal rate and regular rhythm.  ?   Pulses: Normal pulses.  ?   Heart sounds: Normal heart  sounds, S1 normal and S2 normal. No murmur heard. ?  No friction rub. No gallop.  ?Pulmonary:  ?   Effort: Pulmonary effort is normal. No respiratory distress.  ?   Breath sounds: Normal breath sounds. No wheezing, rhonchi or rales.  ?Chest:  ?Breasts: ?   Right: Absent.  ?   Left: Absent.  ?Abdominal:  ?   General: Abdomen is flat. Bowel sounds are normal.  ?   Palpations: Abdomen is soft.  ?   Tenderness: There is abdominal tenderness.  ?   Comments: Non-specific generalized tenderness as noted throughout rest of PE  ?Musculoskeletal:     ?   General: Tenderness present.  ?   Cervical back: Normal range of motion and neck supple.  ?   Right lower leg: Edema present.  ?   Left lower leg: Edema present.  ?   Comments: Non-specific generalized tenderness as noted throughout rest of PE ?  ?Skin: ?   General: Skin is warm and dry.  ?Neurological:  ?   Mental Status: She is alert. She is disoriented and confused.  ?   Cranial Nerves: No dysarthria.  ?   Motor: Weakness present.  ?   Comments: Neuro exam was difficult to assess given level of responsiveness of patient.   ? ? ?ED Results / Procedures / Treatments   ?Labs ?(all labs ordered are listed, but only abnormal results are displayed) ?Labs Reviewed  ?CBC WITH DIFFERENTIAL/PLATELET - Abnormal; Notable for the following components:  ?    Result Value  ? Hemoglobin 11.8 (*)   ? All other components within normal limits  ?COMPREHENSIVE METABOLIC PANEL - Abnormal; Notable for the following components:  ? Potassium 3.2 (*)   ? Calcium 8.3 (*)   ? AST 14 (*)   ? All other components within normal limits  ?URINALYSIS, ROUTINE W REFLEX MICROSCOPIC  ? ? ?EKG ?None ? ?Radiology ?MR Brain W and Wo Contrast ? ?Result Date: 11/28/2021 ?CLINICAL DATA:  Neuro deficit, acute, stroke suspected. Metastatic disease evaluation. Altered mental status over the last few days. History of breast cancer. EXAM: MRI HEAD WITHOUT AND WITH CONTRAST TECHNIQUE: Multiplanar, multiecho pulse  sequences of the brain and surrounding structures were obtained without and with intravenous contrast. CONTRAST:  36m GADAVIST GADOBUTROL 1 MMOL/ML IV SOLN COMPARISON:  09/26/2021.  05/20/2021. FINDINGS: Brain: Diffusion imaging does not show any acute or subacute infarction or other cause of restricted diffusion. No focal abnormality affects the brainstem or cerebellum. Right cerebral hemisphere shows diffuse abnormal T2 signal, particularly anteriorly, likely related to previous radiation. Left hemisphere shows evidence of  multiple previous frontal craniotomy with postsurgical changes of the underlying left frontal lobe. Areas of encephalomalacia and volume loss with widespread abnormal T2 signal in the white matter likely related to previous radiation. Old foci of hemosiderin deposition in the cerebellum and cerebral hemispheres are unchanged. No evidence of residual or recurrent mass lesion. No abnormal contrast enhancement. No hydrocephalus or subdural collection. Vascular: There is atherosclerotic calcification of the major vessels at the base of the brain. Skull and upper cervical spine: Negative Sinuses/Orbits: Clear/normal Other: None IMPRESSION: No change. No evidence of residual, recurrent or new metastatic disease. Postsurgical changes in the left frontal lobe. Regional gliosis and volume loss. More diffuse abnormal T2 white matter signal likely relates to previous radiation. Electronically Signed   By: Nelson Chimes M.D.   On: 11/28/2021 14:50  ? ?DG Chest Port 1 View ? ?Result Date: 11/28/2021 ?CLINICAL DATA:  Altered level of consciousness EXAM: PORTABLE CHEST 1 VIEW COMPARISON:  05/19/2021, 09/26/2021 FINDINGS: Left chest port is stable in positioning. Heart size within normal limits. No focal airspace consolidation, pleural effusion, or pneumothorax. Surgical clips in the right axilla. IMPRESSION: No active disease. Electronically Signed   By: Davina Poke D.O.   On: 11/28/2021 13:55    ? ?Procedures ?Procedures  ? ? ?Medications Ordered in ED ?Medications  ?sodium chloride 0.9 % bolus 1,000 mL (has no administration in time range)  ?gadobutrol (GADAVIST) 1 MMOL/ML injection 10 mL (10 mLs Intravenous Contrast G

## 2021-11-28 NOTE — ED Notes (Signed)
Ambulated pt. Pt was dizzy and needed assistants walking the whole time.  ?

## 2021-11-28 NOTE — ED Notes (Signed)
Patient transported to MRI 

## 2021-11-28 NOTE — Discharge Instructions (Signed)
Your MRI of brain today showed no new changes.  Your urinalysis did not show evidence of infection.  Contact your primary care doctor and your oncologist to set up follow-up appointments for the ongoing chronic symptoms. ?

## 2021-11-28 NOTE — ED Triage Notes (Addendum)
Pt BIB EMS from home, family endorsed altered for the last 2-3 days. Hx of breast cancer metastasized to brain, due for infusion today but too eak to go. Poor appetite, fell last week with injury. Generalized weakness.  ? ?BP 165/80 ?P 72 ?RR 16 ?T 97.6 ?CBG 102 ?

## 2021-11-30 ENCOUNTER — Other Ambulatory Visit: Payer: Self-pay

## 2021-11-30 ENCOUNTER — Encounter: Payer: Self-pay | Admitting: Adult Health

## 2021-11-30 ENCOUNTER — Inpatient Hospital Stay (HOSPITAL_BASED_OUTPATIENT_CLINIC_OR_DEPARTMENT_OTHER): Payer: Medicare Other | Admitting: Adult Health

## 2021-11-30 ENCOUNTER — Inpatient Hospital Stay: Payer: Medicare Other | Attending: Internal Medicine

## 2021-11-30 VITALS — BP 118/87 | HR 57 | Temp 97.4°F | Resp 18 | Ht 62.0 in | Wt 287.7 lb

## 2021-11-30 DIAGNOSIS — Z5181 Encounter for therapeutic drug level monitoring: Secondary | ICD-10-CM

## 2021-11-30 DIAGNOSIS — C50911 Malignant neoplasm of unspecified site of right female breast: Secondary | ICD-10-CM

## 2021-11-30 DIAGNOSIS — T451X5A Adverse effect of antineoplastic and immunosuppressive drugs, initial encounter: Secondary | ICD-10-CM | POA: Diagnosis not present

## 2021-11-30 DIAGNOSIS — G43009 Migraine without aura, not intractable, without status migrainosus: Secondary | ICD-10-CM

## 2021-11-30 DIAGNOSIS — Z9013 Acquired absence of bilateral breasts and nipples: Secondary | ICD-10-CM | POA: Insufficient documentation

## 2021-11-30 DIAGNOSIS — Z9221 Personal history of antineoplastic chemotherapy: Secondary | ICD-10-CM | POA: Diagnosis not present

## 2021-11-30 DIAGNOSIS — G40209 Localization-related (focal) (partial) symptomatic epilepsy and epileptic syndromes with complex partial seizures, not intractable, without status epilepticus: Secondary | ICD-10-CM | POA: Diagnosis not present

## 2021-11-30 DIAGNOSIS — G620 Drug-induced polyneuropathy: Secondary | ICD-10-CM | POA: Diagnosis not present

## 2021-11-30 DIAGNOSIS — Z171 Estrogen receptor negative status [ER-]: Secondary | ICD-10-CM | POA: Insufficient documentation

## 2021-11-30 DIAGNOSIS — I10 Essential (primary) hypertension: Secondary | ICD-10-CM

## 2021-11-30 DIAGNOSIS — T451X5S Adverse effect of antineoplastic and immunosuppressive drugs, sequela: Secondary | ICD-10-CM

## 2021-11-30 DIAGNOSIS — C7931 Secondary malignant neoplasm of brain: Secondary | ICD-10-CM | POA: Insufficient documentation

## 2021-11-30 DIAGNOSIS — Z79899 Other long term (current) drug therapy: Secondary | ICD-10-CM | POA: Diagnosis not present

## 2021-11-30 DIAGNOSIS — Z5112 Encounter for antineoplastic immunotherapy: Secondary | ICD-10-CM | POA: Diagnosis present

## 2021-11-30 DIAGNOSIS — Z923 Personal history of irradiation: Secondary | ICD-10-CM | POA: Insufficient documentation

## 2021-11-30 LAB — CBC WITH DIFFERENTIAL (CANCER CENTER ONLY)
Abs Immature Granulocytes: 0.01 10*3/uL (ref 0.00–0.07)
Basophils Absolute: 0 10*3/uL (ref 0.0–0.1)
Basophils Relative: 1 %
Eosinophils Absolute: 0.1 10*3/uL (ref 0.0–0.5)
Eosinophils Relative: 3 %
HCT: 40.1 % (ref 36.0–46.0)
Hemoglobin: 12.5 g/dL (ref 12.0–15.0)
Immature Granulocytes: 0 %
Lymphocytes Relative: 28 %
Lymphs Abs: 1 10*3/uL (ref 0.7–4.0)
MCH: 29.1 pg (ref 26.0–34.0)
MCHC: 31.2 g/dL (ref 30.0–36.0)
MCV: 93.5 fL (ref 80.0–100.0)
Monocytes Absolute: 0.6 10*3/uL (ref 0.1–1.0)
Monocytes Relative: 17 %
Neutro Abs: 1.7 10*3/uL (ref 1.7–7.7)
Neutrophils Relative %: 51 %
Platelet Count: 168 10*3/uL (ref 150–400)
RBC: 4.29 MIL/uL (ref 3.87–5.11)
RDW: 13.4 % (ref 11.5–15.5)
WBC Count: 3.4 10*3/uL — ABNORMAL LOW (ref 4.0–10.5)
nRBC: 0 % (ref 0.0–0.2)

## 2021-11-30 LAB — CMP (CANCER CENTER ONLY)
ALT: 7 U/L (ref 0–44)
AST: 12 U/L — ABNORMAL LOW (ref 15–41)
Albumin: 3.9 g/dL (ref 3.5–5.0)
Alkaline Phosphatase: 76 U/L (ref 38–126)
Anion gap: 6 (ref 5–15)
BUN: 20 mg/dL (ref 6–20)
CO2: 32 mmol/L (ref 22–32)
Calcium: 9.4 mg/dL (ref 8.9–10.3)
Chloride: 100 mmol/L (ref 98–111)
Creatinine: 1.52 mg/dL — ABNORMAL HIGH (ref 0.44–1.00)
GFR, Estimated: 40 mL/min — ABNORMAL LOW (ref >60)
Glucose, Bld: 111 mg/dL — ABNORMAL HIGH (ref 70–99)
Potassium: 3.9 mmol/L (ref 3.5–5.1)
Sodium: 138 mmol/L (ref 135–145)
Total Bilirubin: 0.5 mg/dL (ref 0.3–1.2)
Total Protein: 7.8 g/dL (ref 6.5–8.1)

## 2021-11-30 MED ORDER — SODIUM CHLORIDE 0.9 % IV SOLN: INTRAVENOUS | Status: AC

## 2021-11-30 NOTE — Progress Notes (Signed)
Monica Mccoy Follow up: ?  ? ?Monica Salter, MD ?Monica Mccoy ?[place] [id number] ? ? ?DIAGNOSIS:  Mccoy Staging  ?No matching staging information was found for the patient. ? 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 Right breast biopsy: Invasive ductal carcinoma, grade 3, HER-2 positive (3+), ER/PR negative, Ki67 5-10%, and in the right breast, high grade DCIS. BRCA gene testing was negative ?  ?08/06/2012 Surgery  ? Bilateral mastectomies:  ?Right breast: Invasive ductal carcinoma, 0.7cm, grade 2, with extensive DCIS, clear margins, 8/11 lymph nodes positive, measuring up to 3.8cm,  ?Left breast, no evidence of malignancy and 6 lymph nodes negative for carcinoma.  ?  ?2014 - 2015 Chemotherapy  ? adjuvant chemotherapy with 4 cycles of dose dense Adriamycin and Cytoxan followed by 4 cycles of Taxol Herceptin and then Herceptin maintenance. She underwent right chest radiation. ? ?  ? ?  ?12/24/2013 Relapse/Recurrence  ? Dizziness, headaches, ataxia, and memory loss. Head CT showed a 1.8cm and a 2.0cm frontal lobe masses. She underwent resection of both masses on 01/07/14,: Moderately differentiated carcinoma, likely of breast origin, HER-2 positive, ER/PR negative, CK7 positive. ?  ?2015 - 2015 Radiation Therapy  ? Received whole brain radiation ?  ? Chemotherapy  ? Taxotere, Herceptin and Perjeta, but Taxotere was discontinued after 4 cycles ? ?  ? ?  ?11/21/2020 -  Chemotherapy  ? Patient is on Treatment Plan : BREAST Trastuzumab q21d  ?   ? ? ?CURRENT THERAPY: Trastuzumab maintenance every 4 weeks ? ?INTERVAL HISTORY: ?Monica Mccoy 56 y.o. female returns for follow-up of her metastatic breast Mccoy.  Her Mccoy is metastatic to her brain and she has developed an increasing amount of confusion and behavior changes.  She has been walking around nude.  She is not eating or drinking as  much as she previously did.  She continues to complain of headaches.  This confusion was so bad that she went to the ER on April 4 and a urinalysis was negative.  She also underwent MRI which showed no evidence of recurrent metastatic disease or progressive metastatic disease.  Radiation changes were all that was noted.  Her most recent CT chest abdomen and pelvis was completed on September 26, 2021.  This showed no evidence of recurrent or metastatic disease. ? ?In review of the ER note she did appear to have improved with hydration with IV fluids.  Her sister is with her today and cannot recall if she improved afterwards.  She notes that she was scheduled for a sleep study to help evaluate some of the behavior changes as ordered by Dr. Mickeal Skinner however this had to be rescheduled. ? ?Her most recent echocardiogram was completed on September 14, 2021.  These are being conducted every 6 months since the patient has been on Herceptin for such a long period of time.  The left ventricular ejection fraction at that time was 55 to 60% and the global longitudinal strain was normal. ? ? ?Patient Active Problem List  ? Diagnosis Date Noted  ? Cholelithiasis 10/16/2021  ? Uterine fibroid 10/16/2021  ? Aortic atherosclerosis ([place]) 10/16/2021  ? 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 Localization-related (focal) (partial) symptomatic epilepsy and epileptic syndromes with complex partial seizures, not intractable, without status epilepticus ([place]) 09/11/2021  ? Essential hypertension 05/30/2021  ? Acute encephalopathy 05/19/2021  ? Port-A-Cath in place 01/16/2021  ? Migraine  without aura 12/19/2020  ? 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 Gastroesophageal reflux disease without esophagitis 06/29/2020  ? Other chronic pain 06/29/2020  ? Moderate episode of recurrent major depressive disorder ([place]) 06/29/2020  ? Urinary incontinence 06/29/2020  ? ? ?is allergic to dilaudid [hydromorphone hcl],  morphine and related, penicillins, and tramadol. ? ?MEDICAL HISTORY: ?Past Medical History:  ?Diagnosis Date  ? Anxiety   ? Breast Mccoy metastasized to brain [facility])   ? Left breast  ? Chronic pain after Mccoy treatment   ? Depression   ? GERD (gastroesophageal reflux disease)   ? History of Mccoy chemotherapy   ? Breast Mccoy left breast  ? Seizure ([place])   ? ? ?SURGICAL HISTORY: ?Past Surgical History:  ?Procedure Laterality Date  ? BRAIN SURGERY    ? brain tumor  ? MASTECTOMY Bilateral   ? ORIF ANKLE FRACTURE Right   ? ? ?SOCIAL HISTORY: ?Social History  ? ?Socioeconomic History  ? Marital status: Divorced  ?  Spouse name: Not on file  ? Number of children: 3  ? Years of education: Not on file  ? Highest education level: Not on file  ?Occupational History  ? Occupation: Disabled  ?Tobacco Use  ? Smoking status: Never  ? Smokeless tobacco: Never  ?Vaping Use  ? Vaping Use: Never used  ?Substance and Sexual Activity  ? Alcohol use: Yes  ?  Comment: On occasion  ? Drug use: Yes  ?  Types: Marijuana  ?  Comment: Occasional use  ? Sexual activity: Not Currently  ?Other Topics Concern  ? Not on file  ?Social History Narrative  ? Right handed  ? Drinks caffeine  ? Two story home  ? ?Social Determinants of Health  ? ?Financial Resource Strain: Not on file  ?Food Insecurity: Not on file  ?Transportation Needs: Not on file  ?Physical Activity: Not on file  ?Stress: Not on file  ?Social Connections: Not on file  ?Intimate Partner Violence: Not on file  ? ? ?FAMILY HISTORY: ?Family History  ?Problem Relation Age of Onset  ? Obesity Mother   ? Hypertension Mother   ? Hyperlipidemia Mother   ? Diabetes Mother   ? Heart disease Mother   ? ? ?Review of Systems  ?Constitutional:  Negative for appetite change, chills, fatigue, fever and unexpected weight change.  ?HENT:   Negative for hearing loss, lump/mass and trouble swallowing.   ?Eyes:  Negative for eye problems and icterus.  ?Respiratory:  Negative for chest tightness,  cough and shortness of breath.   ?Cardiovascular:  Negative for chest pain, leg swelling and palpitations.  ?Gastrointestinal:  Negative for abdominal distention, abdominal pain, constipation, diarrhea, nausea and vomiting.  ?Endocrine: Negative for hot flashes.  ?Genitourinary:  Negative for difficulty urinating.   ?Musculoskeletal:  Negative for arthralgias.  ?Skin:  Negative for itching and rash.  ?Neurological:  Negative for dizziness, extremity weakness, headaches and numbness.  ?Hematological:  Negative for adenopathy. Does not bruise/bleed easily.  ?Psychiatric/Behavioral:  Negative for depression. The patient is not nervous/anxious.    ? ? ?PHYSICAL EXAMINATION ? ?ECOG PERFORMANCE STATUS: 2 - Symptomatic, <50% confined to bed ? ?Vitals:  ? 11/30/21 1200  ?BP: 118/87  ?Pulse: (!) 57  ?Resp: 18  ?Temp: (!) 97.4 ?F (36.3 ?C)  ? ? ?Physical Exam ?Constitutional:   ?   General: She is not in acute distress. ?   Appearance: Normal appearance. She is not toxic-appearing.  ?HENT:  ?  Head: Normocephalic and atraumatic.  ?Eyes:  ?   General: No scleral icterus. ?Cardiovascular:  ?   Rate and Rhythm: Normal rate and regular rhythm.  ?   Pulses: Normal pulses.  ?   Heart sounds: Normal heart sounds.  ?Pulmonary:  ?   Effort: Pulmonary effort is normal.  ?   Breath sounds: Normal breath sounds.  ?Abdominal:  ?   General: Abdomen is flat. Bowel sounds are normal. There is no distension.  ?   Palpations: Abdomen is soft.  ?   Tenderness: There is no abdominal tenderness.  ?Musculoskeletal:     ?   General: No swelling.  ?   Cervical back: Neck supple.  ?Lymphadenopathy:  ?   Cervical: No cervical adenopathy.  ?Skin: ?   General: Skin is warm and dry.  ?   Findings: No rash.  ?Neurological:  ?   General: No focal deficit present.  ?   Mental Status: She is alert.  ?   Comments: Patient conversation is inappropriate for the given situation.  During the visit today she was talking about how her sister is keeping her in  jail.  ?Psychiatric:     ?   Mood and Affect: Mood normal.     ?   Behavior: Behavior normal.  ? ? ?LABORATORY DATA: ? ?CBC ?   ?Component Value Date/Time  ? WBC 3.4 (L) 11/30/2021 1108  ? WBC 4.7 11/28/2021 1325  ?

## 2021-11-30 NOTE — Assessment & Plan Note (Signed)
Moved to [place] from [place],?([facility]), in 02/2020 to live with her parents and son near her sister. ?(2013 HER2 positive breast cancer status post bilateral mastectomies and adjuvant chemotherapy with Herceptin, April 2015 brain metastases status post resection and radiation) ?? ?Prior treatment: Patient was on Herceptin and Perjeta maintenance until June 2021 ?Because of insurance issues she has not had treatment?for?7 months?until she moved to [place]. ?? ?1.?Severe recurrent headaches:   ? 10/20/2020: CT CAP: Post Op changes in Rt chest wall. Multilob uterus. ?? ?? ?Current treatment:?Herceptin maintenance every 4 weeks started 11/21/2020 ?Herceptin toxicities: None ?? ?Plans to continues on Herceptin every 4 weeks with good tolerance.  Her echocardiograms have remained stable.  She is due for repeat echo in July and I have placed orders for this today. ? ?We spent a good amount of time today talking about the memory and behavior changes and what these could be coming from.  Her urinalysis was negative for infection so that is ruled out.  Additionally her MRI of her brain showed some radiation changes.  While it is possible that they are coming from the radiation changes there is some other things that Dr. Mickeal Skinner is working on such as a sleep study that he will follow-up with them about in a few weeks.  1 thing I did notice is that in the ER visit notes they documented that her confusion seem to improve with IV fluids.  Her creatinine today is elevated at 1.5.  2 days ago it was 0.7.  I asked if with her confusion she forgets to drink fluids and her sister was not sure.  We will do IV fluids tomorrow and if she feels better we will see if we cannot coordinate something with the family about reminders for water. ? ?Class to sister agreed to call us after she sees how she does with the infusion. ?

## 2021-11-30 NOTE — Progress Notes (Signed)
Per [facility], ok to run IV fluids concurrently with treatment on 4/7.  ?

## 2021-12-01 ENCOUNTER — Inpatient Hospital Stay: Payer: Medicare Other

## 2021-12-01 VITALS — BP 126/87 | HR 78 | Temp 98.3°F | Resp 18

## 2021-12-01 DIAGNOSIS — Z5112 Encounter for antineoplastic immunotherapy: Secondary | ICD-10-CM | POA: Diagnosis not present

## 2021-12-01 DIAGNOSIS — C50911 Malignant neoplasm of unspecified site of right female breast: Secondary | ICD-10-CM

## 2021-12-01 MED ORDER — SODIUM CHLORIDE 0.9 % IV SOLN: Freq: Once | INTRAVENOUS | Status: DC

## 2021-12-01 MED ORDER — SODIUM CHLORIDE 0.9% FLUSH
10.0000 mL | INTRAVENOUS | Status: DC | PRN
  Administered 2021-12-01: 10 mL

## 2021-12-01 MED ORDER — ACETAMINOPHEN 325 MG PO TABS
650.0000 mg | ORAL_TABLET | Freq: Once | ORAL | Status: AC
  Administered 2021-12-01: 650 mg via ORAL
  Filled 2021-12-01: qty 2

## 2021-12-01 MED ORDER — SODIUM CHLORIDE 0.9 % IV SOLN: INTRAVENOUS | Status: DC

## 2021-12-01 MED ORDER — TRASTUZUMAB-DKST CHEMO 150 MG IV SOLR
6.0000 mg/kg | Freq: Once | INTRAVENOUS | Status: AC
  Administered 2021-12-01: 777 mg via INTRAVENOUS
  Filled 2021-12-01: qty 37

## 2021-12-01 MED ORDER — HEPARIN SOD (PORK) LOCK FLUSH 100 UNIT/ML IV SOLN
500.0000 [IU] | Freq: Once | INTRAVENOUS | Status: AC | PRN
  Administered 2021-12-01: 500 [IU]

## 2021-12-01 MED ORDER — DIPHENHYDRAMINE HCL 25 MG PO CAPS
50.0000 mg | ORAL_CAPSULE | Freq: Once | ORAL | Status: AC
  Administered 2021-12-01: 50 mg via ORAL
  Filled 2021-12-01: qty 2

## 2021-12-01 NOTE — Patient Instructions (Addendum)
Monica Mccoy   ?Discharge Instructions: ?Thank you for choosing [facility] to provide your oncology and hematology care.  ? ?If you have a lab appointment with the [facility], please go directly to the [facility] and check in at the registration area. ?  ?Wear comfortable clothing and clothing appropriate for easy access to any Portacath or PICC line.  ? ?We strive to give you quality time with your provider. You may need to reschedule your appointment if you arrive late (15 or more minutes).  Arriving late affects you and other patients whose appointments are after yours.  Also, if you miss three or more appointments without notifying the office, you may be dismissed from the clinic at the provider?s discretion.    ?  ?For prescription refill requests, have your pharmacy contact our office and allow 72 hours for refills to be completed.   ? ?Today you received the following chemotherapy and/or immunotherapy agents: Trastuzumab    ?  ?To help prevent nausea and vomiting after your treatment, we encourage you to take your nausea medication as directed. ? ?BELOW ARE SYMPTOMS THAT SHOULD BE REPORTED IMMEDIATELY: ?*FEVER GREATER THAN 100.4 F (38 ?C) OR HIGHER ?*CHILLS OR SWEATING ?*NAUSEA AND VOMITING THAT IS NOT CONTROLLED WITH YOUR NAUSEA MEDICATION ?*UNUSUAL SHORTNESS OF BREATH ?*UNUSUAL BRUISING OR BLEEDING ?*URINARY PROBLEMS (pain or burning when urinating, or frequent urination) ?*BOWEL PROBLEMS (unusual diarrhea, constipation, pain near the anus) ?TENDERNESS IN MOUTH AND THROAT WITH OR WITHOUT PRESENCE OF ULCERS (sore throat, sores in mouth, or a toothache) ?UNUSUAL RASH, SWELLING OR PAIN  ?UNUSUAL VAGINAL DISCHARGE OR ITCHING  ? ?Items with * indicate a potential emergency and should be followed up as soon as possible or go to the Emergency Department if any problems should occur. ? ?Please show the CHEMOTHERAPY ALERT CARD or IMMUNOTHERAPY ALERT CARD at check-in  to the Emergency Department and triage nurse. ? ?Should you have questions after your visit or need to cancel or reschedule your appointment, please contact [facility]  Dept: [phone]  and follow the prompts.  Office hours are 8:00 a.m. to 4:30 p.m. Monday - Friday. Please note that voicemails left after 4:00 p.m. may not be returned until the following business day.  We are closed weekends and major holidays. You have access to a nurse at all times for urgent questions. Please call the main number to the clinic Dept: [phone] and follow the prompts. ? ? ?For any non-urgent questions, you may also contact your provider using MyChart. We now offer e-Visits for anyone 28 and older to request care online for non-urgent symptoms. For details visit [email or web address]. ?  ?Also download the MyChart app! Go to the app store, search "MyChart", open the app, select [facility], and log in with your MyChart username and password. ? ?Due to Covid, a mask is required upon entering the hospital/clinic. If you do not have a mask, one will be given to you upon arrival. For doctor visits, patients may have 1 support person aged 62 or older with them. For treatment visits, patients cannot have anyone with them due to current Covid guidelines and our immunocompromised population.  ? ?Rehydration, Adult ?Rehydration is the replacement of body fluids, salts, and minerals (electrolytes) that are lost during dehydration. Dehydration is when there is not enough water or other fluids in the body. This happens when you lose more fluids than you take in. Common causes of dehydration include: ?  Not drinking enough fluids. This can occur when you are ill or doing activities that require a lot of energy, especially in hot weather. ?Conditions that cause loss of water or other fluids, such as diarrhea, vomiting, sweating, or urinating a lot. ?Other illnesses, such as fever or infection. ?Certain  medicines, such as those that remove excess fluid from the body (diuretics). ?Symptoms of mild or moderate dehydration may include thirst, dry lips and mouth, and dizziness. Symptoms of severe dehydration may include increased heart rate, confusion, fainting, and not urinating. ?For severe dehydration, you may need to get fluids through an IV at the hospital. For mild or moderate dehydration, you can usually rehydrate at home by drinking certain fluids as told by your health care provider. ?What are the risks? ?Generally, rehydration is safe. However, taking in too much fluid (overhydration) can be a problem. This is rare. Overhydration can cause an electrolyte imbalance, kidney failure, or a decrease in salt (sodium) levels in the body. ?Supplies needed ?You will need an oral rehydration solution (ORS) if your health care provider tells you to use one. This is a drink to treat dehydration. It can be found in pharmacies and retail stores. ?How to rehydrate ?Fluids ?Follow instructions from your health care provider for rehydration. The kind of fluid and the amount you should drink depend on your condition. In general, you should choose drinks that you prefer. ?If told by your health care provider, drink an ORS. ?Make an ORS by following instructions on the package. ?Start by drinking small amounts, about ? cup (120 mL) every 5-10 minutes. ?Slowly increase how much you drink until you have taken the amount recommended by your health care provider. ?Drink enough clear fluids to keep your urine pale yellow. If you were told to drink an ORS, finish it first, then start slowly drinking other clear fluids. Drink fluids such as: ?Water. This includes sparkling water and flavored water. Drinking only water can lead to having too little sodium in your body (hyponatremia). Follow the advice of your health care provider. ?Water from ice chips you suck on. ?Fruit juice with water you add to it (diluted). ?Sports drinks. ?Hot or  cold herbal teas. ?Broth-based soups. ?Milk or milk products. ?Food ?Follow instructions from your health care provider about what to eat while you rehydrate. Your health care provider may recommend that you slowly begin eating regular foods in small amounts. ?Eat foods that contain a healthy balance of electrolytes, such as bananas, oranges, potatoes, tomatoes, and spinach. ?Avoid foods that are greasy or contain a lot of sugar. ?In some cases, you may get nutrition through a feeding tube that is passed through your nose and into your stomach (nasogastric tube, or NG tube). This may be done if you have uncontrolled vomiting or diarrhea. ?Beverages to avoid ?Certain beverages may make dehydration worse. While you rehydrate, avoid drinking alcohol. ?How to tell if you are recovering from dehydration ?You may be recovering from dehydration if: ?You are urinating more often than before you started rehydrating. ?Your urine is pale yellow. ?Your energy level improves. ?You vomit less frequently. ?You have diarrhea less frequently. ?Your appetite improves or returns to normal. ?You feel less dizzy or less light-headed. ?Your skin tone and color start to look more normal. ?Follow these instructions at home: ?Take over-the-counter and prescription medicines only as told by your health care provider. ?Do not take sodium tablets. Doing this can lead to having too much sodium in your body (hypernatremia). ?Contact a  health care provider if: ?You continue to have symptoms of mild or moderate dehydration, such as: ?Thirst. ?Dry lips. ?Slightly dry mouth. ?Dizziness. ?Dark urine or less urine than normal. ?Muscle cramps. ?You continue to vomit or have diarrhea. ?Get help right away if you: ?Have symptoms of dehydration that get worse. ?Have a fever. ?Have a severe headache. ?Have been vomiting and the following happens: ?Your vomiting gets worse or does not go away. ?Your vomit includes blood or green matter (bile). ?You cannot  eat or drink without vomiting. ?Have problems with urination or bowel movements, such as: ?Diarrhea that gets worse or does not go away. ?Blood in your stool (feces). This may cause stool to look black and

## 2021-12-05 NOTE — Telephone Encounter (Signed)
Cancelled appt due to situation - did not count as no show/late cancellation - fee waived ?

## 2021-12-12 NOTE — Progress Notes (Signed)
? ?Patient Care Team: ?Haydee Salter, MD as PCP - General (Family Medicine) ?Nicholas Lose, MD as Consulting Physician (Hematology and Oncology) ?Cameron Sprang, MD as Consulting Physician (Neurology) ? ?DIAGNOSIS:  ?Encounter Diagnosis  ?Name Primary?  ? Cancer of right breast metastatic to brain [facility])   ? ? ?SUMMARY OF ONCOLOGIC HISTORY: ?Oncology History  ?Cancer of right breast metastatic to brain [facility])  ?06/13/2012 Initial Diagnosis  ? Right breast biopsy: Invasive ductal carcinoma, grade 3, HER-2 positive (3+), ER/PR negative, Ki67 5-10%, and in the right breast, high grade DCIS. BRCA gene testing was negative ?  ?08/06/2012 Surgery  ? Bilateral mastectomies:  ?Right breast: Invasive ductal carcinoma, 0.7cm, grade 2, with extensive DCIS, clear margins, 8/11 lymph nodes positive, measuring up to 3.8cm,  ?Left breast, no evidence of malignancy and 6 lymph nodes negative for carcinoma.  ?  ?2014 - 2015 Chemotherapy  ? adjuvant chemotherapy with 4 cycles of dose dense Adriamycin and Cytoxan followed by 4 cycles of Taxol Herceptin and then Herceptin maintenance. She underwent right chest radiation. ? ?  ? ?  ?12/24/2013 Relapse/Recurrence  ? Dizziness, headaches, ataxia, and memory loss. Head CT showed a 1.8cm and a 2.0cm frontal lobe masses. She underwent resection of both masses on 01/07/14,: Moderately differentiated carcinoma, likely of breast origin, HER-2 positive, ER/PR negative, CK7 positive. ?  ?2015 - 2015 Radiation Therapy  ? Received whole brain radiation ?  ? Chemotherapy  ? Taxotere, Herceptin and Perjeta, but Taxotere was discontinued after 4 cycles ? ?  ? ?  ?11/21/2020 -  Chemotherapy  ? Patient is on Treatment Plan : BREAST Trastuzumab q21d  ? ?  ?  ? ? ?CHIEF COMPLIANT: Follow-up on Herceptin therapy ? ?INTERVAL HISTORY: Monica Mccoy is a  56 y.o. with above-mentioned history of metastatic breast cancer who is currently on Herceptin maintenance therapy. She presents to the clinic today for  follow-up. She states that she is tolerating the herceptin. She complains of diarrhea once a day but its manageable. She complains of swelling in both feet. She complains of headaches. She states that when she takes tramadol it doesn't relieve the pain. ? ? ?ALLERGIES:  is allergic to dilaudid [hydromorphone hcl], morphine and related, penicillins, and tramadol. ? ?MEDICATIONS:  ?Current Outpatient Medications  ?Medication Sig Dispense Refill  ? acetaminophen (TYLENOL) 500 MG tablet Take 1,000 mg by mouth every 6 (six) hours as needed.    ? amLODipine (NORVASC) 5 MG tablet TAKE 1 TABLET (5 MG TOTAL) BY MOUTH DAILY. 90 tablet 3  ? ciprofloxacin (CIPRO) 500 MG tablet Take 1 tablet (500 mg total) by mouth 2 (two) times daily. 14 tablet 0  ? divalproex (DEPAKOTE) 250 MG DR tablet Take 1 tablet (250 mg total) by mouth 2 (two) times daily. 60 tablet 1  ? DULoxetine (CYMBALTA) 60 MG capsule Take 1 capsule (60 mg total) by mouth 2 (two) times daily. 180 capsule 3  ? furosemide (LASIX) 20 MG tablet Take 1 tablet (20 mg total) by mouth daily. 30 tablet 3  ? gabapentin (NEURONTIN) 300 MG capsule Take 1 cap in AM, 1 cap in afternoon, 2 caps at bedtime 120 capsule 11  ? MELATONIN GUMMIES PO Take 10 mg by mouth.    ? mirabegron ER (MYRBETRIQ) 50 MG TB24 tablet Take 1 tablet (50 mg total) by mouth daily. 90 tablet 1  ? traMADol (ULTRAM) 50 MG tablet Take 2 tablets (100 mg total) by mouth 2 (two) times daily as needed. 60 tablet  0  ? ?No current facility-administered medications for this visit.  ? ? ?PHYSICAL EXAMINATION: ?ECOG PERFORMANCE STATUS: 1 - Symptomatic but completely ambulatory ? ?Vitals:  ? 12/26/21 1012  ?BP: 108/84  ?Pulse: 87  ?Resp: 16  ?Temp: 97.9 ?F (36.6 ?C)  ?SpO2: 100%  ? ?Filed Weights  ? 12/26/21 1012  ?Weight: (!) 303 lb 12.8 oz (137.8 kg)  ? ?  ? ?LABORATORY DATA:  ?I have reviewed the data as listed ? ?  Latest Ref Rng & Units 11/30/2021  ? 11:08 AM 11/28/2021  ?  1:25 PM 11/07/2021  ? 11:02 AM  ?CMP  ?Glucose  70 - 99 mg/dL 111   84   104    ?BUN 6 - 20 mg/dL $Remove'20   10   13    'kuYZpVx$ ?Creatinine 0.44 - 1.00 mg/dL 1.52   0.70   0.80    ?Sodium 135 - 145 mmol/L 138   139   140    ?Potassium 3.5 - 5.1 mmol/L 3.9   3.2   3.2    ?Chloride 98 - 111 mmol/L 100   104   102    ?CO2 22 - 32 mmol/L 32   30   30    ?Calcium 8.9 - 10.3 mg/dL 9.4   8.3   8.6    ?Total Protein 6.5 - 8.1 g/dL 7.8   7.0   6.8    ?Total Bilirubin 0.3 - 1.2 mg/dL 0.5   0.6   0.9    ?Alkaline Phos 38 - 126 U/L 76   69   76    ?AST 15 - 41 U/L $Remo'12   14   11    'eCPRm$ ?ALT 0 - 44 U/L $Remo'7   8   6    'zPfHe$ ? ? ?Lab Results  ?Component Value Date  ? WBC 3.4 (L) 11/30/2021  ? HGB 12.5 11/30/2021  ? HCT 40.1 11/30/2021  ? MCV 93.5 11/30/2021  ? PLT 168 11/30/2021  ? NEUTROABS 1.7 11/30/2021  ? ? ?ASSESSMENT & PLAN:  ?Cancer of right breast metastatic to brain [facility]) ?Moved to [place] from [place], [facility]), in 02/2020 to live with her parents and son near her sister. ?(2013 HER2 positive breast cancer status post bilateral mastectomies and adjuvant chemotherapy with Herceptin, April 2015 brain metastases status post resection and radiation) ?  ?Prior treatment: Patient was on Herceptin and Perjeta maintenance until June 2021 ?Because of insurance issues she has not had treatment for 7 months until she moved to [place]. ?  ?Current treatment: Herceptin maintenance every 4 weeks started 11/21/2020 ?Herceptin toxicities: None ? ?CT chest abdomen pelvis 09/27/2021: No new or progressive findings to suggest recurrent or metastatic disease ?Brain MRI 09/26/2020 (confusion as well as frequent falls.):  No intracranial abnormalities ?Brain MRI 11/28/2021: No change  ?  ?Scans to be performed in 3 months and follow-up after that. ?Follow-up in 4 weeks for Herceptin and every 12 weeks for follow-up with me. ? ? ? ?Orders Placed This Encounter  ?Procedures  ? CT CHEST ABDOMEN PELVIS W CONTRAST  ?  Standing Status:   Future  ?  Standing Expiration Date:   12/27/2022  ?  Order  Specific Question:   Is patient pregnant?  ?  Answer:   No  ?  Order Specific Question:   Preferred imaging location?  ?  Answer:   [facility]  ?  Order Specific Question:   Release to patient  ?  Answer:   Immediate  ?  Order Specific Question:   Is Oral Contrast requested for this exam?  ?  Answer:   Yes, Per Radiology protocol  ? ?The patient has a good understanding of the overall plan. she agrees with it. she will call with any problems that may develop before the next visit here. ?Total time spent: 30 mins including face to face time and time spent for planning, charting and co-ordination of care ? ? Harriette Ohara, MD ?12/26/21 ? ? ? I Gardiner Coins am scribing for Dr. Lindi Adie ? ?I have reviewed the above documentation for accuracy and completeness, and I agree with the above. ?  ?

## 2021-12-13 ENCOUNTER — Encounter: Payer: Self-pay | Admitting: Hematology and Oncology

## 2021-12-14 ENCOUNTER — Other Ambulatory Visit: Payer: Self-pay

## 2021-12-14 ENCOUNTER — Inpatient Hospital Stay (HOSPITAL_BASED_OUTPATIENT_CLINIC_OR_DEPARTMENT_OTHER): Payer: Medicare Other | Admitting: Internal Medicine

## 2021-12-14 VITALS — BP 138/109 | HR 70 | Temp 98.0°F | Resp 18 | Wt 297.0 lb

## 2021-12-14 DIAGNOSIS — C50911 Malignant neoplasm of unspecified site of right female breast: Secondary | ICD-10-CM

## 2021-12-14 DIAGNOSIS — G43009 Migraine without aura, not intractable, without status migrainosus: Secondary | ICD-10-CM | POA: Diagnosis not present

## 2021-12-14 DIAGNOSIS — C7931 Secondary malignant neoplasm of brain: Secondary | ICD-10-CM

## 2021-12-14 DIAGNOSIS — G40209 Localization-related (focal) (partial) symptomatic epilepsy and epileptic syndromes with complex partial seizures, not intractable, without status epilepticus: Secondary | ICD-10-CM | POA: Diagnosis not present

## 2021-12-14 DIAGNOSIS — G620 Drug-induced polyneuropathy: Secondary | ICD-10-CM | POA: Diagnosis not present

## 2021-12-14 DIAGNOSIS — Z5112 Encounter for antineoplastic immunotherapy: Secondary | ICD-10-CM | POA: Diagnosis not present

## 2021-12-14 DIAGNOSIS — E559 Vitamin D deficiency, unspecified: Secondary | ICD-10-CM

## 2021-12-14 DIAGNOSIS — C7989 Secondary malignant neoplasm of other specified sites: Secondary | ICD-10-CM

## 2021-12-14 DIAGNOSIS — T451X5A Adverse effect of antineoplastic and immunosuppressive drugs, initial encounter: Secondary | ICD-10-CM

## 2021-12-14 NOTE — Progress Notes (Signed)
? ?[facility] at [address]  ?[place], [place] [address] ?[phone] ? ? ?Interval Evaluation ? ?Date of Service: 12/14/21 ?Patient Name: Monica Mccoy ?Patient MRN: [id number] ?Patient DOB: Jun 08, 1966 ?Provider: Ventura Sellers, MD ? ?Identifying Statement:  ?Monica Mccoy is a 56 y.o. female with migraine headaches ? ?Primary Cancer: ? ?Oncologic History: ?Oncology History  ?Cancer of right breast metastatic to brain [facility])  ?06/13/2012 Initial Diagnosis  ? Right breast biopsy: Invasive ductal carcinoma, grade 3, HER-2 positive (3+), ER/PR negative, Ki67 5-10%, and in the right breast, high grade DCIS. BRCA gene testing was negative ?  ?08/06/2012 Surgery  ? Bilateral mastectomies:  ?Right breast: Invasive ductal carcinoma, 0.7cm, grade 2, with extensive DCIS, clear margins, 8/11 lymph nodes positive, measuring up to 3.8cm,  ?Left breast, no evidence of malignancy and 6 lymph nodes negative for carcinoma.  ?  ?2014 - 2015 Chemotherapy  ? adjuvant chemotherapy with 4 cycles of dose dense Adriamycin and Cytoxan followed by 4 cycles of Taxol Herceptin and then Herceptin maintenance. She underwent right chest radiation. ? ?  ? ?  ?12/24/2013 Relapse/Recurrence  ? Dizziness, headaches, ataxia, and memory loss. Head CT showed a 1.8cm and a 2.0cm frontal lobe masses. She underwent resection of both masses on 01/07/14,: Moderately differentiated carcinoma, likely of breast origin, HER-2 positive, ER/PR negative, CK7 positive. ?  ?2015 - 2015 Radiation Therapy  ? Received whole brain radiation ?  ? Chemotherapy  ? Taxotere, Herceptin and Perjeta, but Taxotere was discontinued after 4 cycles ? ?  ? ?  ?11/21/2020 -  Chemotherapy  ? Patient is on Treatment Plan : BREAST Trastuzumab q21d  ? ?  ?  ? ?CNS Oncologic History ?2015: L frontal craniotomy followed by WBRT at [facility] [employer] ? ?Interval History: ? Monica Mccoy presents today for clinical follow up.  Sister describes ongoing  confusion, inappropriate behavior, disorientation.  This did lead to one recent ED visit, no admission.  She continues to be increasingly needing of help with ADLs.  Urinary symptoms appear resolved following prior visit, treatment, but no clear improvement in cognitiong.  Depakote has been decreased to $RemoveBefo'250mg'nouyeLmmjPf$  twice per day.  Headaches are still present.  Continues with herceptin infusions. ? ?H+P (10/24/20) Patient presents today to review and assess neurologic complaints.  Much of history was provided by sister present at bedside.  She describes chronic short term memory loss, fatigue, and depression symptoms since undergoing whole brain radiation for metastatic breast cancer in 2015.  She also describes difficulty with communication, forming sentences and getting words out properly/fluidly.  Also describes twice weekly migraine headaches associated with nausea/vomiting, photophobia.  She will take a Tylenol or Tramodol late in the headache course, with minimal efficacy. Currently living with family in [place] after living most of life in [place] area.  Needs assistance with many ADLs because of cognitive impairment, physical disability secondary to chronic pain.  Her sleep pattern is very irregular.      ? ?Medications: ?Current Outpatient Medications on File Prior to Visit  ?Medication Sig Dispense Refill  ? acetaminophen (TYLENOL) 500 MG tablet Take 1,000 mg by mouth every 6 (six) hours as needed.    ? amLODipine (NORVASC) 5 MG tablet TAKE 1 TABLET (5 MG TOTAL) BY MOUTH DAILY. 90 tablet 3  ? ciprofloxacin (CIPRO) 500 MG tablet Take 1 tablet (500 mg total) by mouth 2 (two) times daily. 14 tablet 0  ? divalproex (DEPAKOTE) 250 MG DR tablet Take  1 tablet (250 mg total) by mouth 2 (two) times daily. 60 tablet 1  ? DULoxetine (CYMBALTA) 60 MG capsule Take 1 capsule (60 mg total) by mouth 2 (two) times daily. 180 capsule 3  ? furosemide (LASIX) 20 MG tablet Take 1 tablet (20 mg total) by mouth daily. 30 tablet 3  ?  gabapentin (NEURONTIN) 300 MG capsule Take 1 cap in AM, 1 cap in afternoon, 2 caps at bedtime 120 capsule 11  ? MELATONIN GUMMIES PO Take 10 mg by mouth.    ? mirabegron ER (MYRBETRIQ) 50 MG TB24 tablet Take 1 tablet (50 mg total) by mouth daily. 90 tablet 1  ? traMADol (ULTRAM) 50 MG tablet Take 2 tablets (100 mg total) by mouth 2 (two) times daily as needed. 60 tablet 0  ? ?No current facility-administered medications on file prior to visit.  ? ? ?Allergies:  ?Allergies  ?Allergen Reactions  ? Dilaudid [Hydromorphone Hcl] Other (See Comments)  ?  Confusion & hallucinations  ? Morphine And Related Other (See Comments)  ?  HA  ? Penicillins   ? Tramadol   ? ?Past Medical History:  ?Past Medical History:  ?Diagnosis Date  ? Anxiety   ? Breast cancer metastasized to brain [facility])   ? Left breast  ? Chronic pain after cancer treatment   ? Depression   ? GERD (gastroesophageal reflux disease)   ? History of cancer chemotherapy   ? Breast cancer left breast  ? Seizure ([place])   ? ?Past Surgical History:  ?Past Surgical History:  ?Procedure Laterality Date  ? BRAIN SURGERY    ? brain tumor  ? MASTECTOMY Bilateral   ? ORIF ANKLE FRACTURE Right   ? ?Social History:  ?Social History  ? ?Socioeconomic History  ? Marital status: Divorced  ?  Spouse name: Not on file  ? Number of children: 3  ? Years of education: Not on file  ? Highest education level: Not on file  ?Occupational History  ? Occupation: Disabled  ?Tobacco Use  ? Smoking status: Never  ? Smokeless tobacco: Never  ?Vaping Use  ? Vaping Use: Never used  ?Substance and Sexual Activity  ? Alcohol use: Yes  ?  Comment: On occasion  ? Drug use: Yes  ?  Types: Marijuana  ?  Comment: Occasional use  ? Sexual activity: Not Currently  ?Other Topics Concern  ? Not on file  ?Social History Narrative  ? Right handed  ? Drinks caffeine  ? Two story home  ? ?Social Determinants of Health  ? ?Financial Resource Strain: Not on file  ?Food Insecurity: Not on file  ?Transportation  Needs: Not on file  ?Physical Activity: Not on file  ?Stress: Not on file  ?Social Connections: Not on file  ?Intimate Partner Violence: Not on file  ? ?Family History:  ?Family History  ?Problem Relation Age of Onset  ? Obesity Mother   ? Hypertension Mother   ? Hyperlipidemia Mother   ? Diabetes Mother   ? Heart disease Mother   ? ? ?Review of Systems: ?Constitutional: Doesn't report fevers, chills or abnormal weight loss ?Eyes: Doesn't report blurriness of vision ?Ears, nose, mouth, throat, and face: Doesn't report sore throat ?Respiratory: Doesn't report cough, dyspnea or wheezes ?Cardiovascular: Doesn't report palpitation, chest discomfort  ?Gastrointestinal:  Doesn't report nausea, constipation, diarrhea ?GU: Doesn't report incontinence ?Skin: Doesn't report skin rashes ?Neurological: Per HPI ?Musculoskeletal: Doesn't report joint pain ?Behavioral/Psych: Doesn't report anxiety ? ?Physical Exam: ?Vitals:  ? 12/14/21  1219  ?BP: (!) 138/109  ?Pulse: 70  ?Resp: 18  ?Temp: 98 ?F (36.7 ?C)  ?SpO2: 99%  ? ?KPS: 60. ?General: Alert, cooperative, pleasant, in no acute distress ?Head: Normal ?EENT: No conjunctival injection or scleral icterus.  ?Lungs: Resp effort normal ?Cardiac: Regular rate ?Abdomen: Non-distended abdomen ?Skin: No rashes cyanosis or petechiae. ?Extremities: No clubbing or edema ? ?Neurologic Exam: ?Mental Status: Awake, alert, attentive to examiner. Oriented to self and environment. Language is noted for impairment in fluency, and frequent paraphasic errors.  Age advanced psychomotor slowing. ?Cranial Nerves: Visual acuity is grossly normal. Visual fields are full. Extra-ocular movements intact. No ptosis. Face is symmetric ?Motor: Tone and bulk are normal. Power is full in both arms and legs. Reflexes are symmetric, no pathologic reflexes present.  ?Sensory: Intact to light touch ?Gait: Deferred, orthopedic limitation ? ?Labs: ?I have reviewed the data as listed ?   ?Component Value Date/Time  ? NA  138 11/30/2021 1108  ? NA 139 12/15/2019 0000  ? K 3.9 11/30/2021 1108  ? CL 100 11/30/2021 1108  ? CO2 32 11/30/2021 1108  ? GLUCOSE 111 (H) 11/30/2021 1108  ? BUN 20 11/30/2021 1108  ? BUN 10 04/20/202

## 2021-12-21 ENCOUNTER — Ambulatory Visit: Payer: Medicare Other | Admitting: Family Medicine

## 2021-12-26 ENCOUNTER — Inpatient Hospital Stay: Payer: Medicare Other

## 2021-12-26 ENCOUNTER — Inpatient Hospital Stay (HOSPITAL_BASED_OUTPATIENT_CLINIC_OR_DEPARTMENT_OTHER): Payer: Medicare Other | Admitting: Hematology and Oncology

## 2021-12-26 ENCOUNTER — Inpatient Hospital Stay: Payer: Medicare Other | Attending: Internal Medicine

## 2021-12-26 ENCOUNTER — Other Ambulatory Visit: Payer: Self-pay

## 2021-12-26 DIAGNOSIS — Z9013 Acquired absence of bilateral breasts and nipples: Secondary | ICD-10-CM | POA: Diagnosis not present

## 2021-12-26 DIAGNOSIS — C7989 Secondary malignant neoplasm of other specified sites: Secondary | ICD-10-CM

## 2021-12-26 DIAGNOSIS — E559 Vitamin D deficiency, unspecified: Secondary | ICD-10-CM | POA: Insufficient documentation

## 2021-12-26 DIAGNOSIS — Z79899 Other long term (current) drug therapy: Secondary | ICD-10-CM | POA: Insufficient documentation

## 2021-12-26 DIAGNOSIS — C7931 Secondary malignant neoplasm of brain: Secondary | ICD-10-CM | POA: Diagnosis present

## 2021-12-26 DIAGNOSIS — G40209 Localization-related (focal) (partial) symptomatic epilepsy and epileptic syndromes with complex partial seizures, not intractable, without status epilepticus: Secondary | ICD-10-CM

## 2021-12-26 DIAGNOSIS — Z171 Estrogen receptor negative status [ER-]: Secondary | ICD-10-CM | POA: Insufficient documentation

## 2021-12-26 DIAGNOSIS — C50911 Malignant neoplasm of unspecified site of right female breast: Secondary | ICD-10-CM

## 2021-12-26 DIAGNOSIS — Z923 Personal history of irradiation: Secondary | ICD-10-CM | POA: Diagnosis not present

## 2021-12-26 DIAGNOSIS — Z5112 Encounter for antineoplastic immunotherapy: Secondary | ICD-10-CM | POA: Insufficient documentation

## 2021-12-26 DIAGNOSIS — Z95828 Presence of other vascular implants and grafts: Secondary | ICD-10-CM

## 2021-12-26 LAB — VITAMIN B12: Vitamin B-12: 292 pg/mL (ref 180–914)

## 2021-12-26 LAB — VALPROIC ACID LEVEL: Valproic Acid Lvl: 35 ug/mL — ABNORMAL LOW (ref 50.0–100.0)

## 2021-12-26 LAB — TSH: TSH: 3.43 u[IU]/mL (ref 0.350–4.500)

## 2021-12-26 LAB — AMMONIA: Ammonia: 23 umol/L (ref 9–35)

## 2021-12-26 LAB — VITAMIN D 25 HYDROXY (VIT D DEFICIENCY, FRACTURES): Vit D, 25-Hydroxy: 20.69 ng/mL — ABNORMAL LOW (ref 30–100)

## 2021-12-26 MED ORDER — SODIUM CHLORIDE 0.9 % IV SOLN: Freq: Once | INTRAVENOUS | Status: AC

## 2021-12-26 MED ORDER — SODIUM CHLORIDE 0.9% FLUSH
10.0000 mL | INTRAVENOUS | Status: DC | PRN
  Administered 2021-12-26: 10 mL

## 2021-12-26 MED ORDER — ACETAMINOPHEN 325 MG PO TABS
650.0000 mg | ORAL_TABLET | Freq: Once | ORAL | Status: AC
  Administered 2021-12-26: 650 mg via ORAL
  Filled 2021-12-26: qty 2

## 2021-12-26 MED ORDER — HEPARIN SOD (PORK) LOCK FLUSH 100 UNIT/ML IV SOLN
500.0000 [IU] | Freq: Once | INTRAVENOUS | Status: AC | PRN
  Administered 2021-12-26: 500 [IU]

## 2021-12-26 MED ORDER — TRASTUZUMAB-DKST CHEMO 150 MG IV SOLR
6.0000 mg/kg | Freq: Once | INTRAVENOUS | Status: AC
  Administered 2021-12-26: 777 mg via INTRAVENOUS
  Filled 2021-12-26: qty 37

## 2021-12-26 MED ORDER — DIPHENHYDRAMINE HCL 25 MG PO CAPS
50.0000 mg | ORAL_CAPSULE | Freq: Once | ORAL | Status: AC
  Administered 2021-12-26: 50 mg via ORAL
  Filled 2021-12-26: qty 2

## 2021-12-26 NOTE — Assessment & Plan Note (Addendum)
Moved to [place] from [place],?([facility]), in 02/2020 to live with her parents and son near her sister. ?(2013 HER2 positive breast cancer status post bilateral mastectomies and adjuvant chemotherapy with Herceptin, April 2015 brain metastases status post resection and radiation) ?? ?Prior treatment: Patient was on Herceptin and Perjeta maintenance until June 2021 ?Because of insurance issues she has not had treatment?for?7 months?until she moved to [place]. ?? ?Current treatment:?Herceptin maintenance every 4 weeks started 11/21/2020 ?Herceptin toxicities: None ? ?CT chest abdomen pelvis 09/27/2021: No new or progressive findings to suggest recurrent or metastatic disease ?Brain MRI 09/26/2020 (confusion as well as frequent falls.):  No intracranial abnormalities ?Brain MRI 11/28/2021: No change  ?? ?Follow-up in 4 weeks for Herceptin and every 12 weeks for follow-up with me. ?

## 2021-12-26 NOTE — Patient Instructions (Signed)
[facility] MEDICAL [facility]COLOG[facility]  Discharge Instructions: ?Thank you for choosing [facility] to provide your oncology and hematology care.  ? ?If you have a lab appointment with the Cancer Center, please go directly to the Cancer Center and check in at the registration area. ?  ?Wear comfortable clothing and clothing appropriate for easy access to any Portacath or PICC line.  ? ?We strive to give you quality time with your provider. You may need to reschedule your appointment if you arrive late (15 or more minutes).  Arriving late affects you and other patients whose appointments are after yours.  Also, if you miss three or more appointments without notifying the office, you may be dismissed from the clinic at the provider?s discretion.    ?  ?For prescription refill requests, have your pharmacy contact our office and allow 72 hours for refills to be completed.   ? ?Today you received the following chemotherapy and/or immunotherapy agents: Trastuzumab    ?  ?To help prevent nausea and vomiting after your treatment, we encourage you to take your nausea medication as directed. ? ?BELOW ARE SYMPTOMS THAT SHOULD BE REPORTED IMMEDIATELY: ?*FEVER GREATER THAN 100.4 F (38 ?C) OR HIGHER ?*CHILLS OR SWEATING ?*NAUSEA AND VOMITING THAT IS NOT CONTROLLED WITH YOUR NAUSEA MEDICATION ?*UNUSUAL SHORTNESS OF BREATH ?*UNUSUAL BRUISING OR BLEEDING ?*URINARY PROBLEMS (pain or burning when urinating, or frequent urination) ?*BOWEL PROBLEMS (unusual diarrhea, constipation, pain near the anus) ?TENDERNESS IN MOUTH AND THROAT WITH OR WITHOUT PRESENCE OF ULCERS (sore throat, sores in mouth, or a toothache) ?UNUSUAL RASH, SWELLING OR PAIN  ?UNUSUAL VAGINAL DISCHARGE OR ITCHING  ? ?Items with * indicate a potential emergency and should be followed up as soon as possible or go to the Emergency Department if any problems should occur. ? ?Please show the CHEMOTHERAPY ALERT CARD or IMMUNOTHERAPY ALERT CARD at check-in  to the Emergency Department and triage nurse. ? ?Should you have questions after your visit or need to cancel or reschedule your appointment, please contact [facility]  Dept: [phone]  and follow the prompts.  Office hours are 8:00 a.m. to 4:30 p.m. Monday - Friday. Please note that voicemails left after 4:00 p.m. may not be returned until the following business day.  We are closed weekends and major holidays. You have access to a nurse at all times for urgent questions. Please call the main number to the clinic Dept: [phone] and follow the prompts. ? ? ?For any non-urgent questions, you may also contact your provider using MyChart. We now offer e-Visits for anyone 18 and older to request care online for non-urgent symptoms. For details visit [email or web address]. ?  ?Also download the MyChart app! Go to the app store, search "MyChart", open the app, select , and log in with your MyChart username and password. ? ?Due to Covid, a mask is required upon entering the hospital/clinic. If you do not have a mask, one will be given to you upon arrival. For doctor visits, patients may have 1 support person aged 18 or older with them. For treatment visits, patients cannot have anyone with them due to current Covid guidelines and our immunocompromised population.  ? ?

## 2021-12-27 ENCOUNTER — Telehealth: Payer: Self-pay | Admitting: Hematology and Oncology

## 2021-12-27 NOTE — Telephone Encounter (Signed)
Per 5/2 los called and spoke to pt sister about changes and up coming appointments.  Requested a calender be mailed so I am mailing.   ?

## 2021-12-29 ENCOUNTER — Ambulatory Visit: Payer: Medicare Other | Admitting: Physical Medicine and Rehabilitation

## 2021-12-31 ENCOUNTER — Ambulatory Visit (HOSPITAL_BASED_OUTPATIENT_CLINIC_OR_DEPARTMENT_OTHER): Payer: Medicare Other | Attending: Internal Medicine | Admitting: Internal Medicine

## 2021-12-31 ENCOUNTER — Other Ambulatory Visit: Payer: Self-pay

## 2021-12-31 VITALS — Ht 66.0 in | Wt 290.0 lb

## 2021-12-31 DIAGNOSIS — G43009 Migraine without aura, not intractable, without status migrainosus: Secondary | ICD-10-CM | POA: Diagnosis not present

## 2021-12-31 DIAGNOSIS — R0683 Snoring: Secondary | ICD-10-CM | POA: Diagnosis not present

## 2021-12-31 DIAGNOSIS — G4719 Other hypersomnia: Secondary | ICD-10-CM

## 2022-01-06 DIAGNOSIS — G4710 Hypersomnia, unspecified: Secondary | ICD-10-CM

## 2022-01-06 DIAGNOSIS — G43009 Migraine without aura, not intractable, without status migrainosus: Secondary | ICD-10-CM

## 2022-01-06 NOTE — Procedures (Signed)
? ? ? ?  Patient Name: Monica Mccoy, Monica Mccoy ?Study Date: 12/31/2021 ?Gender: Female ?D.O.B: 03-May-1966 ?Age (years): 58 ?Referring Provider: Ventura Sellers ?Height (inches): 66 ?Interpreting Physician: Baird Lyons MD, ABSM ?Weight (lbs): 290 ?RPSGT: Steffey, Lennette Bihari ?BMI: 47 ?MRN: [id number] ?Neck Size: 15.00 ? ?CLINICAL INFORMATION ?Sleep Study Type: NPSG ?Indication for sleep study: Hypertension ?Epworth Sleepiness Score: 11 ? ?SLEEP STUDY TECHNIQUE ?As per the AASM Manual for the Scoring of Sleep and Associated Events v2.3 (April 2016) with a hypopnea requiring 4% desaturations. ? ?The channels recorded and monitored were frontal, central and occipital EEG, electrooculogram (EOG), submentalis EMG (chin), nasal and oral airflow, thoracic and abdominal wall motion, anterior tibialis EMG, snore microphone, electrocardiogram, and pulse oximetry. ? ?MEDICATIONS ?Medications self-administered by patient taken the night of the study : none reported ? ?SLEEP ARCHITECTURE ?The study was initiated at 11:05:41 PM and ended at 5:09:21 AM. ? ?Sleep onset time was 50.4 minutes and the sleep efficiency was 52.0%%. The total sleep time was 189 minutes. ? ?Stage REM latency was N/A minutes. ? ?The patient spent 19.3%% of the night in stage N1 sleep, 80.7%% in stage N2 sleep, 0.0%% in stage N3 and 0% in REM. ? ?Alpha intrusion was absent. ? ?Supine sleep was 100.00%. ? ?RESPIRATORY PARAMETERS ?The overall apnea/hypopnea index (AHI) was 0.0 per hour. There were 0 total apneas, including 0 obstructive, 0 central and 0 mixed apneas. There were 0 hypopneas and 2 RERAs. ? ?The AHI during Stage REM sleep was N/A per hour. ? ?AHI while supine was 0.0 per hour. ? ?The mean oxygen saturation was 95.8%. The minimum SpO2 during sleep was 93.0%. ? ?soft snoring was noted during this study. ? ?CARDIAC DATA ?The 2 lead EKG demonstrated sinus rhythm. The mean heart rate was 87.2 beats per minute. Other EKG findings include: None. ? ?LEG MOVEMENT  DATA ?The total PLMS were 0 with a resulting PLMS index of 0.0. Associated arousal with leg movement index was 0.0 . ? ?IMPRESSIONS ?- No significant obstructive sleep apnea occurred during this study (AHI = 0.0/h). ?- The patient had minimal or no oxygen desaturation during the study (Min O2 = 93.0%) ?- The patient snored with soft snoring volume. ?- No cardiac abnormalities were noted during this study. ?- Clinically significant periodic limb movements did not occur during sleep. No significant associated arousals. ?- Patient had nonspecific difficulty initiating and maintaining sleep. ? ?DIAGNOSIS ?- Other sleep Disturbance ? ?RECOMMENDATIONS ?- Consider managing for insomnia based on clinical judgment. ?- Sleep hygiene should be reviewed to assess factors that may improve sleep quality. ?- Weight management and regular exercise should be initiated or continued if appropriate. ? ?[Electronically signed] 01/06/2022 12:39 PM ? ?Baird Lyons MD, ABSM ?Diplomate, Tax adviser of Sleep Medicine ?NPI: [id number] ?  ? ? ? ? ? ? ? ? ? ? ? ? ? ? ? ? ? ? ? ? ? ?Monica Mccoy ?Diplomate, Tax adviser of Sleep Medicine ? ?ELECTRONICALLY SIGNED ON:  01/06/2022, 12:36 PM ?[place] ?PH: [phone]   FX: [phone] ?ACCREDITED BY THE AMERICAN ACADEMY OF SLEEP MEDICINE ?

## 2022-01-08 ENCOUNTER — Telehealth: Payer: Self-pay | Admitting: Internal Medicine

## 2022-01-08 ENCOUNTER — Encounter: Payer: Self-pay | Admitting: Physical Medicine and Rehabilitation

## 2022-01-08 ENCOUNTER — Encounter
Payer: Medicare Other | Attending: Physical Medicine and Rehabilitation | Admitting: Physical Medicine and Rehabilitation

## 2022-01-08 VITALS — BP 135/85 | HR 69 | Ht 66.0 in | Wt 303.8 lb

## 2022-01-08 DIAGNOSIS — N1832 Chronic kidney disease, stage 3b: Secondary | ICD-10-CM | POA: Insufficient documentation

## 2022-01-08 DIAGNOSIS — E1122 Type 2 diabetes mellitus with diabetic chronic kidney disease: Secondary | ICD-10-CM | POA: Diagnosis present

## 2022-01-08 DIAGNOSIS — R600 Localized edema: Secondary | ICD-10-CM | POA: Insufficient documentation

## 2022-01-08 NOTE — Telephone Encounter (Signed)
Scheduled per 5/15 in basket, message has been left with pt ?

## 2022-01-08 NOTE — Progress Notes (Signed)
? ?Subjective:  ? ? Patient ID: Monica Mccoy, female    DOB: [birth year], 56 y.o.   MRN: [id number] ? ?HPI ?Monica Mccoy is a 56 year old woman who presents to establish care for painful peripheral neuropathy. ? ?1) Peripheral neuropathy ?-her pain is present in her bilateral lower extremities along both the dorsum and soles of her feet.  ?-the pain is 10/10 and is very severe ?-it keeps her awake at night. ?-she would be willing to take opioids to control her pain but her sister who accompanies her is trying to avoid this for her ?-she has not been tested for diabetes ? ?2) Bilateral lower extremity edema ?-she has not been diagnose with heart failure but her mother has this.  ? ? ?Pain Inventory ?Average Pain 10 ?Pain Right Now 10 ?My pain is intermittent, burning, and tingling ? ?In the last 24 hours, has pain interfered with the following? ?General activity 9 ?Relation with others 8 ?Enjoyment of life 9 ?What TIME of day is your pain at its worst? morning  and night ?Sleep (in general) Poor ? ?Pain is worse with: walking, bending, sitting, and standing ?Pain improves with: medication ?Relief from Meds: 4 ? ?use a walker ?ability to climb steps?  no ?do you drive?  no ?use a wheelchair ?needs help with transfers ? ?not employed: date last employed . ?I need assistance with the following:  dressing, bathing, meal prep, household duties, and shopping ? ?bladder control problems ?weakness ?numbness ?tingling ?trouble walking ?confusion ?depression ?anxiety ? ?Any changes since last visit?  no ? ?Any changes since last visit?  no ? ? ? ?Family History  ?Problem Relation Age of Onset  ? Obesity Mother   ? Hypertension Mother   ? Hyperlipidemia Mother   ? Diabetes Mother   ? Heart disease Mother   ? ?Social History  ? ?Socioeconomic History  ? Marital status: Divorced  ?  Spouse name: Not on file  ? Number of children: 3  ? Years of education: Not on file  ? Highest education level: Not on file  ?Occupational History  ?  Occupation: Disabled  ?Tobacco Use  ? Smoking status: Never  ? Smokeless tobacco: Never  ?Vaping Use  ? Vaping Use: Never used  ?Substance and Sexual Activity  ? Alcohol use: Yes  ?  Comment: On occasion  ? Drug use: Yes  ?  Types: Marijuana  ?  Comment: Occasional use  ? Sexual activity: Not Currently  ?Other Topics Concern  ? Not on file  ?Social History Narrative  ? Right handed  ? Drinks caffeine  ? Two story home  ? ?Social Determinants of Health  ? ?Financial Resource Strain: Not on file  ?Food Insecurity: Not on file  ?Transportation Needs: Not on file  ?Physical Activity: Not on file  ?Stress: Not on file  ?Social Connections: Not on file  ? ?Past Surgical History:  ?Procedure Laterality Date  ? BRAIN SURGERY    ? brain tumor  ? MASTECTOMY Bilateral   ? ORIF ANKLE FRACTURE Right   ? ?Past Medical History:  ?Diagnosis Date  ? Anxiety   ? Breast cancer metastasized to brain [facility])   ? Left breast  ? Chronic pain after cancer treatment   ? Depression   ? GERD (gastroesophageal reflux disease)   ? History of cancer chemotherapy   ? Breast cancer left breast  ? Seizure ([place])   ? ?BP 135/85   Pulse 69   Ht '5\' 6"'$  (  1.676 m)   Wt (!) 303 lb 12.8 oz (137.8 kg) Comment: per last visit  SpO2 95%   BMI 49.03 kg/m?  ? ?Opioid Risk Score:   ?Fall Risk Score:  `1 ? ?Depression screen PHQ 2/9 ? ? ?  01/08/2022  ?  9:30 AM 10/16/2021  ?  1:25 PM 08/30/2020  ?  9:18 AM 06/29/2020  ? 12:25 PM  ?Depression screen PHQ 2/9  ?Decreased Interest 1 2 0 2  ?Down, Depressed, Hopeless '2 2 2 2  '$ ?PHQ - 2 Score '3 4 2 4  '$ ?Altered sleeping '3 3 1 3  '$ ?Tired, decreased energy '3 2 1 2  '$ ?Change in appetite 1 1 0 2  ?Feeling bad or failure about yourself  0 0 1 0  ?Trouble concentrating '1 1 1 1  '$ ?Moving slowly or fidgety/restless '3 2 1 2  '$ ?Suicidal thoughts 0 0 0 0  ?PHQ-9 Score '14 13 7 14  '$ ?Difficult doing work/chores Extremely dIfficult Very difficult Not difficult at all Very difficult  ?  ? ?Review of Systems  ?Constitutional:  Positive for  unexpected weight change.  ?HENT: Negative.    ?Eyes: Negative.   ?Respiratory: Negative.    ?Cardiovascular: Negative.   ?Gastrointestinal: Negative.   ?Endocrine: Negative.   ?Genitourinary:  Positive for difficulty urinating.  ?Musculoskeletal:  Positive for gait problem.  ?Skin: Negative.   ?Allergic/Immunologic: Negative.   ?Neurological:  Positive for weakness and numbness.  ?Psychiatric/Behavioral:  Positive for confusion, dysphoric mood and sleep disturbance. The patient is nervous/anxious.   ? ?   ?Objective:  ? Physical Exam ?Gen: no distress, normal appearing, accompanied by her sister, weight 303 lbs, BMI 49.03, BP 135/85.  ?HEENT: oral mucosa pink and moist, NCAT ?Cardio: Reg rate ?Chest: normal effort, normal rate of breathing ?Abd: soft, non-distended ?Ext: bilateral lower extremity edema.  ?Psych: pleasant, normal affect ?Skin: intact, no open lesions, hydrated.  ?Neuro: Alert and oriented, decreased sensation to bilateral lower extremities.  ? ? ?   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  ?-Discussed benefits of exercise in reducing pain. ?-Discussed following foods that may reduce pain: ?1) Ginger (especially studied for arthritis)- reduce leukotriene production to decrease inflammation ?2) Blueberries- high in phytonutrients that decrease inflammation ?3) Salmon- marine omega-3s reduce joint swelling and pain ?4) Pumpkin seeds- reduce inflammation ?5) dark chocolate- reduces inflammation ?6) turmeric- reduces inflammation ?7) tart cherries - reduce pain and stiffness ?8) extra virgin olive oil - its compound olecanthal helps to block prostaglandins  ?9) chili peppers- can be eaten or applied topically via capsaicin ?10) mint- helpful for headache, muscle aches, joint pain, and itching ?11) garlic- reduces inflammation ? ?Link to further information on diet for chronic pain:  http://www.randall.com/  ? ?-Discussed Qutenza as an option for neuropathic pain control. Discussed that this is a capsaicin patch, stronger than capsaicin cream. Discussed that it is currently approved for diabetic peripheral neuropathy and post-herpetic neuralgia, but that it has also shown benefit in treating other forms of neuropathy. Provided patient with link to site to learn more about the patch: [employer]. Discussed that the patch would be placed in office and benefits usually last 3 months. Discussed that unintended exposure to capsaicin can cause severe irritation of eyes, mucous membranes, respiratory tract, and skin, but that Qutenza is a local treatment and does not have the systemic side effects of other nerve medications. Discussed that there may be  pain, itching, erythema, and decreased sensory function associated with the application of Qutenza. Side effects usually subside within 1 week. A cold pack of analgesic medications can help with these side effects. Blood pressure can also be increased due to pain associated with administration of the patch.  ? ?-check hgbA1c ? ?2) Lower extremity edema: ?-avoid salt ?-can apply compression garments but this may be comfortable for her ?-check BNP ? ? ?

## 2022-01-11 ENCOUNTER — Other Ambulatory Visit: Payer: Self-pay | Admitting: Internal Medicine

## 2022-01-11 ENCOUNTER — Inpatient Hospital Stay: Payer: Medicare Other | Admitting: Internal Medicine

## 2022-01-11 ENCOUNTER — Other Ambulatory Visit: Payer: Self-pay | Admitting: Hematology and Oncology

## 2022-01-12 ENCOUNTER — Encounter: Payer: Self-pay | Admitting: Hematology and Oncology

## 2022-01-13 ENCOUNTER — Other Ambulatory Visit: Payer: Self-pay | Admitting: Internal Medicine

## 2022-01-13 DIAGNOSIS — Z8669 Personal history of other diseases of the nervous system and sense organs: Secondary | ICD-10-CM

## 2022-01-15 ENCOUNTER — Encounter: Payer: Self-pay | Admitting: Hematology and Oncology

## 2022-01-18 ENCOUNTER — Other Ambulatory Visit: Payer: Self-pay | Admitting: Family Medicine

## 2022-01-18 ENCOUNTER — Ambulatory Visit: Payer: Medicare Other | Admitting: Family Medicine

## 2022-01-18 ENCOUNTER — Other Ambulatory Visit: Payer: Self-pay | Admitting: Internal Medicine

## 2022-01-18 DIAGNOSIS — R600 Localized edema: Secondary | ICD-10-CM

## 2022-01-19 ENCOUNTER — Other Ambulatory Visit: Payer: Self-pay

## 2022-01-23 ENCOUNTER — Other Ambulatory Visit: Payer: Medicare Other

## 2022-01-23 ENCOUNTER — Ambulatory Visit: Payer: Medicare Other | Admitting: Family Medicine

## 2022-01-23 ENCOUNTER — Telehealth: Payer: Self-pay | Admitting: Family Medicine

## 2022-01-23 ENCOUNTER — Ambulatory Visit: Payer: Medicare Other | Admitting: Hematology and Oncology

## 2022-01-23 ENCOUNTER — Telehealth: Payer: Self-pay | Admitting: *Deleted

## 2022-01-23 ENCOUNTER — Inpatient Hospital Stay: Payer: Medicare Other

## 2022-01-23 ENCOUNTER — Telehealth: Payer: Self-pay | Admitting: Hematology and Oncology

## 2022-01-23 NOTE — Telephone Encounter (Signed)
Pt was a no show on 01/23/22 with Dr. Gena Fray for an OV,  I sent a no show letter.

## 2022-01-23 NOTE — Telephone Encounter (Signed)
R/s per 5/30 in basket, pt has been called and sister confirmed appt

## 2022-01-23 NOTE — Telephone Encounter (Signed)
Short term antibiotic.  No refill available without cause.

## 2022-01-23 NOTE — Telephone Encounter (Signed)
Received call from pt with complaint of car trouble and is unable to come in for tx today.  Pt requesting to push out tx by 1 week.  RN sent high priority scheduling message.

## 2022-01-28 ENCOUNTER — Other Ambulatory Visit: Payer: Self-pay

## 2022-01-28 ENCOUNTER — Emergency Department (HOSPITAL_COMMUNITY): Payer: Medicare Other

## 2022-01-28 ENCOUNTER — Inpatient Hospital Stay (HOSPITAL_COMMUNITY)
Admission: EM | Admit: 2022-01-28 | Discharge: 2022-02-02 | DRG: 071 | Disposition: A | Discharge status: Home Health | Payer: Medicare Other | Attending: Family Medicine | Admitting: Family Medicine

## 2022-01-28 ENCOUNTER — Encounter (HOSPITAL_COMMUNITY): Payer: Self-pay

## 2022-01-28 DIAGNOSIS — G9341 Metabolic encephalopathy: Secondary | ICD-10-CM | POA: Diagnosis present

## 2022-01-28 DIAGNOSIS — I10 Essential (primary) hypertension: Secondary | ICD-10-CM | POA: Diagnosis present

## 2022-01-28 DIAGNOSIS — E872 Acidosis, unspecified: Secondary | ICD-10-CM | POA: Diagnosis present

## 2022-01-28 DIAGNOSIS — Z923 Personal history of irradiation: Secondary | ICD-10-CM

## 2022-01-28 DIAGNOSIS — E876 Hypokalemia: Secondary | ICD-10-CM | POA: Diagnosis present

## 2022-01-28 DIAGNOSIS — Z8249 Family history of ischemic heart disease and other diseases of the circulatory system: Secondary | ICD-10-CM

## 2022-01-28 DIAGNOSIS — Z171 Estrogen receptor negative status [ER-]: Secondary | ICD-10-CM

## 2022-01-28 DIAGNOSIS — Z833 Family history of diabetes mellitus: Secondary | ICD-10-CM

## 2022-01-28 DIAGNOSIS — G620 Drug-induced polyneuropathy: Secondary | ICD-10-CM | POA: Diagnosis present

## 2022-01-28 DIAGNOSIS — G8929 Other chronic pain: Secondary | ICD-10-CM | POA: Diagnosis present

## 2022-01-28 DIAGNOSIS — C50911 Malignant neoplasm of unspecified site of right female breast: Secondary | ICD-10-CM | POA: Diagnosis not present

## 2022-01-28 DIAGNOSIS — R4182 Altered mental status, unspecified: Principal | ICD-10-CM

## 2022-01-28 DIAGNOSIS — Z9013 Acquired absence of bilateral breasts and nipples: Secondary | ICD-10-CM

## 2022-01-28 DIAGNOSIS — G9340 Encephalopathy, unspecified: Principal | ICD-10-CM | POA: Diagnosis present

## 2022-01-28 DIAGNOSIS — Z83438 Family history of other disorder of lipoprotein metabolism and other lipidemia: Secondary | ICD-10-CM

## 2022-01-28 DIAGNOSIS — C50919 Malignant neoplasm of unspecified site of unspecified female breast: Secondary | ICD-10-CM | POA: Diagnosis present

## 2022-01-28 DIAGNOSIS — R410 Disorientation, unspecified: Secondary | ICD-10-CM | POA: Diagnosis not present

## 2022-01-28 DIAGNOSIS — Z6841 Body Mass Index (BMI) 40.0 and over, adult: Secondary | ICD-10-CM

## 2022-01-28 DIAGNOSIS — G894 Chronic pain syndrome: Secondary | ICD-10-CM | POA: Diagnosis present

## 2022-01-28 DIAGNOSIS — F331 Major depressive disorder, recurrent, moderate: Secondary | ICD-10-CM | POA: Diagnosis present

## 2022-01-28 DIAGNOSIS — T451X5A Adverse effect of antineoplastic and immunosuppressive drugs, initial encounter: Secondary | ICD-10-CM | POA: Diagnosis present

## 2022-01-28 DIAGNOSIS — G40209 Localization-related (focal) (partial) symptomatic epilepsy and epileptic syndromes with complex partial seizures, not intractable, without status epilepticus: Secondary | ICD-10-CM | POA: Diagnosis present

## 2022-01-28 DIAGNOSIS — C7931 Secondary malignant neoplasm of brain: Secondary | ICD-10-CM | POA: Diagnosis present

## 2022-01-28 DIAGNOSIS — K219 Gastro-esophageal reflux disease without esophagitis: Secondary | ICD-10-CM | POA: Diagnosis present

## 2022-01-28 DIAGNOSIS — Z853 Personal history of malignant neoplasm of breast: Secondary | ICD-10-CM

## 2022-01-28 DIAGNOSIS — Z20822 Contact with and (suspected) exposure to covid-19: Secondary | ICD-10-CM | POA: Diagnosis present

## 2022-01-28 DIAGNOSIS — Z79899 Other long term (current) drug therapy: Secondary | ICD-10-CM

## 2022-01-28 LAB — CBC
HCT: 41.3 % (ref 36.0–46.0)
Hemoglobin: 12.8 g/dL (ref 12.0–15.0)
MCH: 29.2 pg (ref 26.0–34.0)
MCHC: 31 g/dL (ref 30.0–36.0)
MCV: 94.1 fL (ref 80.0–100.0)
Platelets: 188 10*3/uL (ref 150–400)
RBC: 4.39 MIL/uL (ref 3.87–5.11)
RDW: 13.4 % (ref 11.5–15.5)
WBC: 5.1 10*3/uL (ref 4.0–10.5)
nRBC: 0 % (ref 0.0–0.2)

## 2022-01-28 LAB — COMPREHENSIVE METABOLIC PANEL
ALT: 10 U/L (ref 0–44)
AST: 19 U/L (ref 15–41)
Albumin: 3.9 g/dL (ref 3.5–5.0)
Alkaline Phosphatase: 66 U/L (ref 38–126)
Anion gap: 11 (ref 5–15)
BUN: 8 mg/dL (ref 6–20)
CO2: 23 mmol/L (ref 22–32)
Calcium: 9.3 mg/dL (ref 8.9–10.3)
Chloride: 103 mmol/L (ref 98–111)
Creatinine, Ser: 0.73 mg/dL (ref 0.44–1.00)
GFR, Estimated: >60 mL/min (ref >60)
Glucose, Bld: 91 mg/dL (ref 70–99)
Potassium: 3.4 mmol/L — ABNORMAL LOW (ref 3.5–5.1)
Sodium: 137 mmol/L (ref 135–145)
Total Bilirubin: 1 mg/dL (ref 0.3–1.2)
Total Protein: 8.4 g/dL — ABNORMAL HIGH (ref 6.5–8.1)

## 2022-01-28 LAB — URINALYSIS, ROUTINE W REFLEX MICROSCOPIC
Bilirubin Urine: NEGATIVE
Glucose, UA: NEGATIVE mg/dL
Hgb urine dipstick: NEGATIVE
Ketones, ur: NEGATIVE mg/dL
Leukocytes,Ua: NEGATIVE
Nitrite: NEGATIVE
Protein, ur: NEGATIVE mg/dL
Specific Gravity, Urine: 1.009 (ref 1.005–1.030)
pH: 9 — ABNORMAL HIGH (ref 5.0–8.0)

## 2022-01-28 LAB — LIPASE, BLOOD: Lipase: 21 U/L (ref 11–51)

## 2022-01-28 LAB — LACTIC ACID, PLASMA: Lactic Acid, Venous: 2.8 mmol/L (ref 0.5–1.9)

## 2022-01-28 LAB — POC OCCULT BLOOD, ED: Fecal Occult Bld: NEGATIVE

## 2022-01-28 IMAGING — CR DG CHEST 2V
2 series · 2 of 2 positions shown · non-contrast
Comparison: Chest radiograph [DATE], CT [DATE]

CLINICAL DATA: Shortness of breath, weakness and confusion.

EXAM:
CHEST - 2 VIEW

[w chest lat]
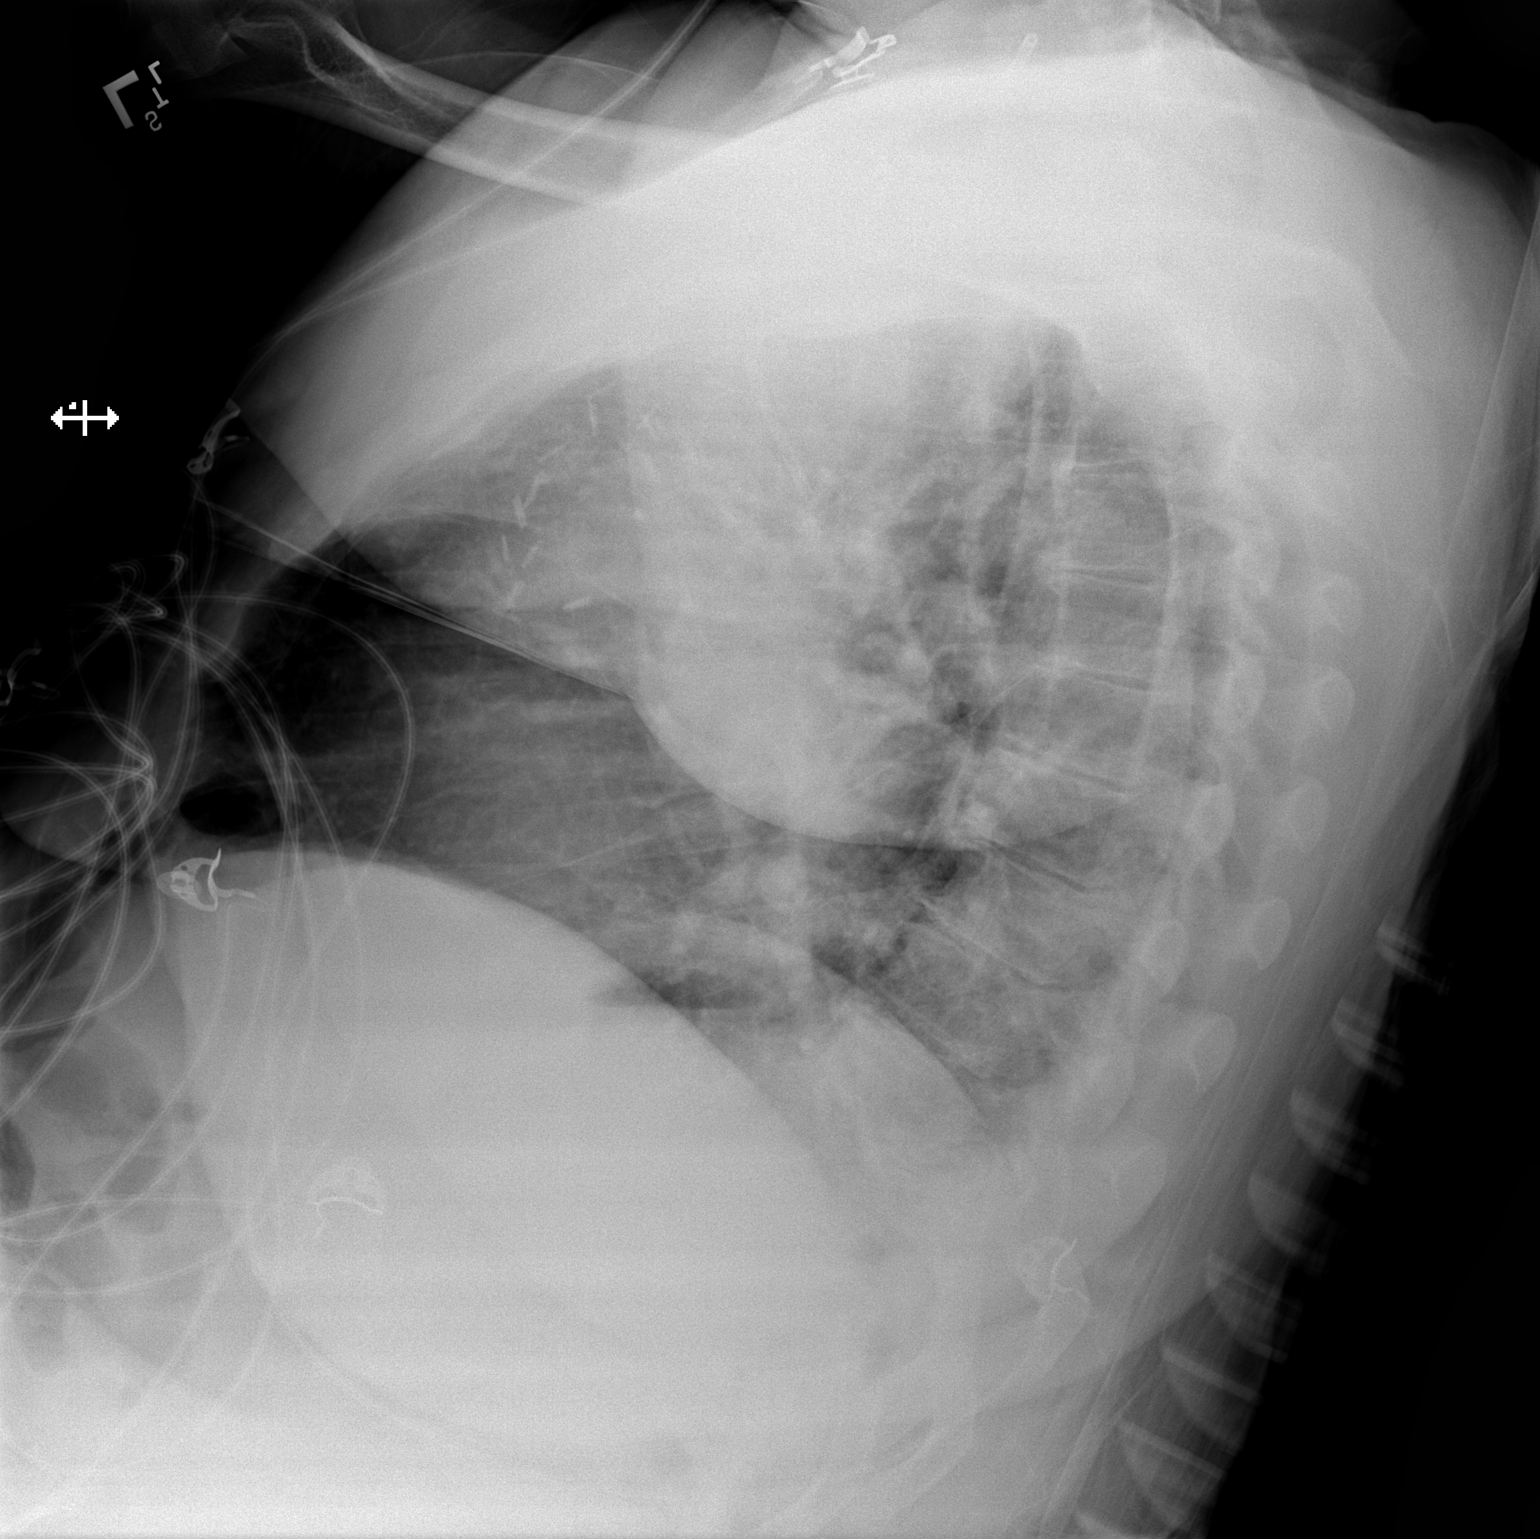

[x chest ap]
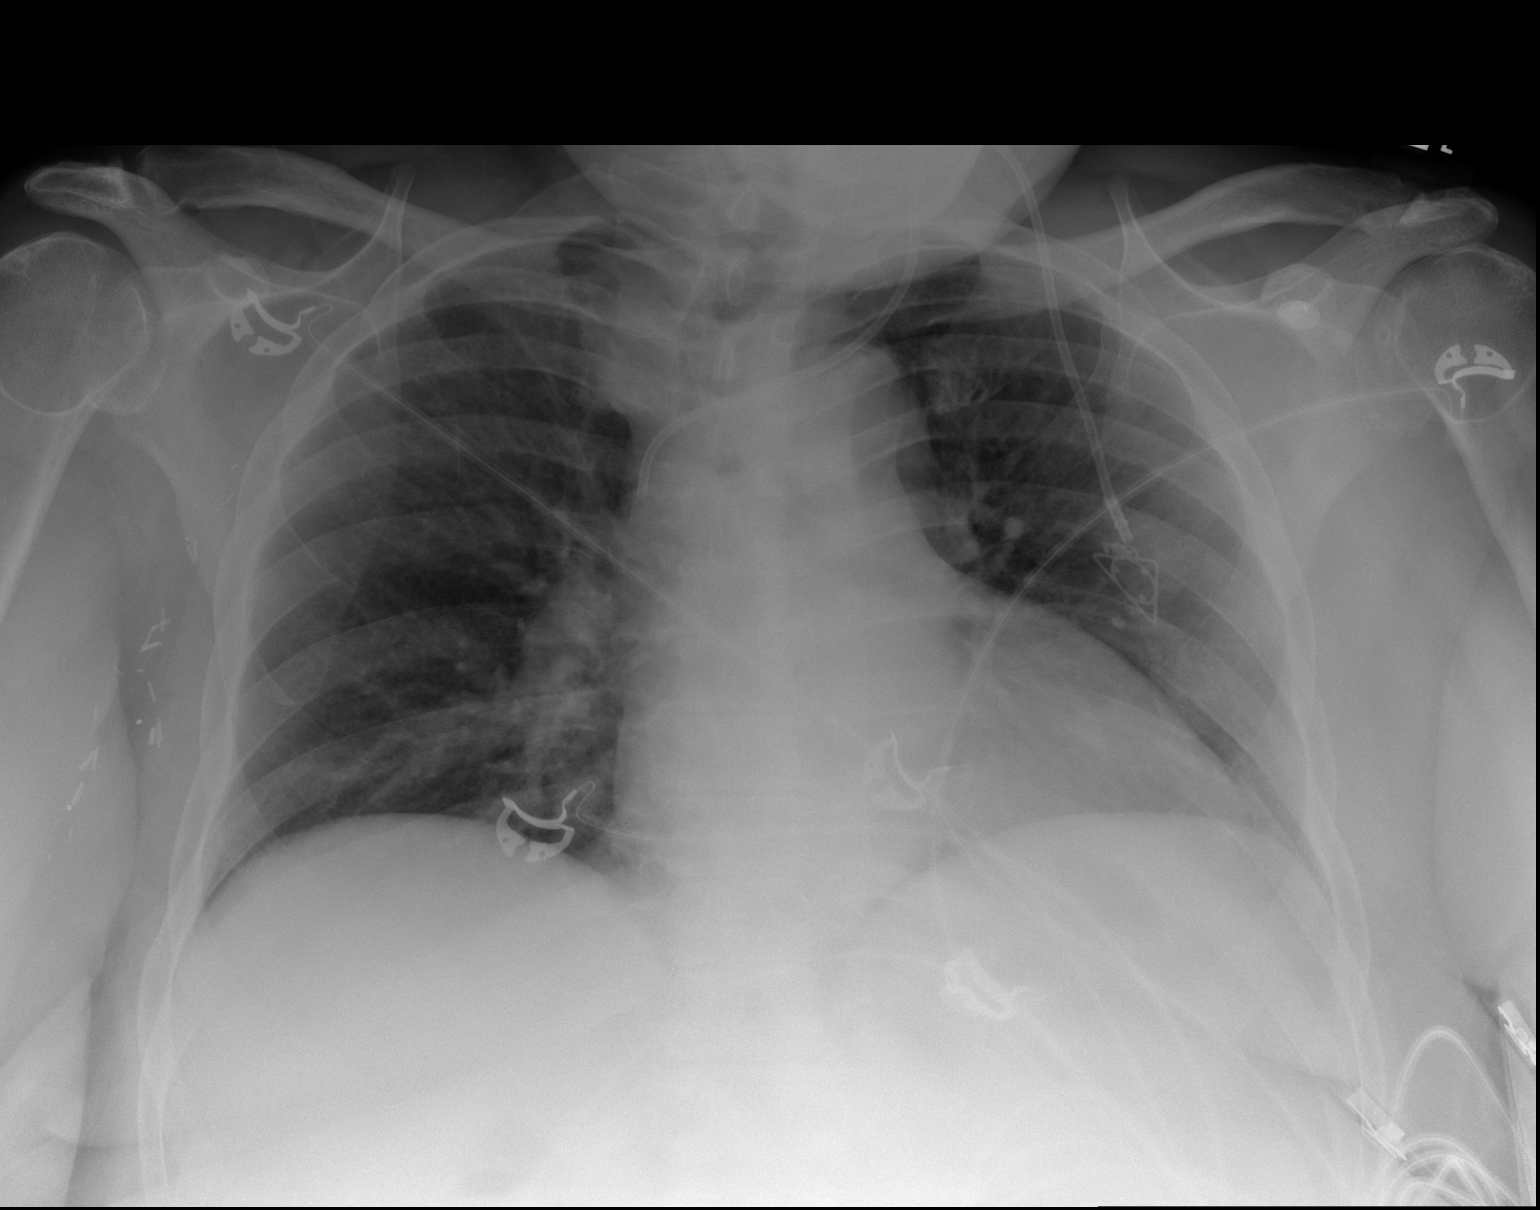

[2 of 2 positions shown; findings below may reference images not displayed]

FINDINGS: Low lung volumes. Left chest port remains in place. Upper normal
heart size which is likely accentuated by technique and low lung
volumes. Increased atelectasis at the right lung base from prior
exam. No confluent consolidation. No pneumothorax or large pleural
effusion. Right axillary surgical clips. On limited assessment, no
acute osseous abnormalities are seen.
IMPRESSION: Low lung volumes with increased right basilar atelectasis.

## 2022-01-28 IMAGING — CT CT ABD-PELV W/ CM
2 of 5 series · 15 of 46 positions shown, 17 images · IV contrast (agent unspecified)
Comparison: [DATE]

CLINICAL DATA: Acute abdominal pain.

Electronic records indicates history of breast cancer.
EXAM:
CT ABDOMEN AND PELVIS WITH CONTRAST
TECHNIQUE: Multidetector CT imaging of the abdomen and pelvis was performed
using the standard protocol following bolus administration of
intravenous contrast.

[Series 3: axial st · axial · 0.98mm/px · z∈[-825,-385]mm · 12 of 104 slices shown, 14 images]
[im 8/104  soft-tissue]
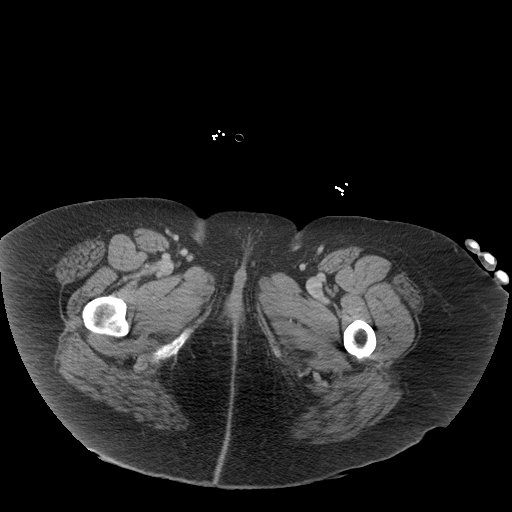
[im 8/104  bone]
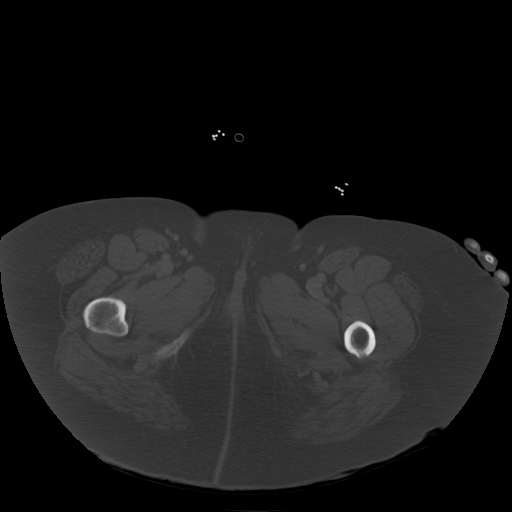
[im 16/104  soft-tissue]
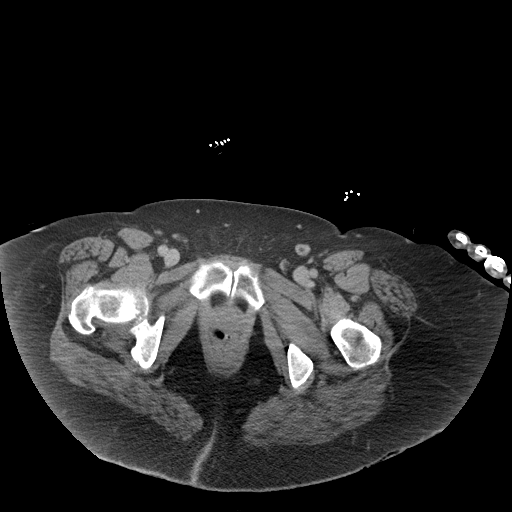
[im 24/104  soft-tissue]
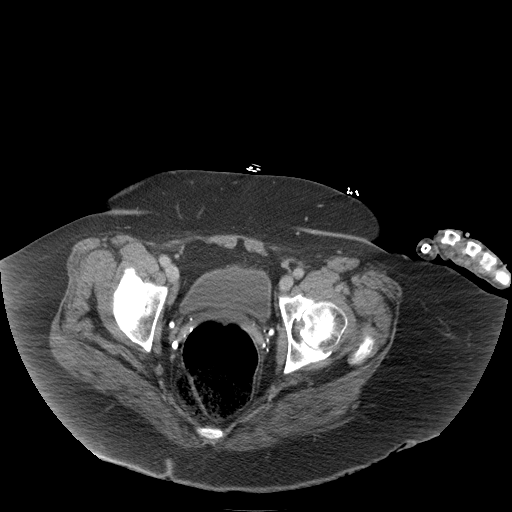
[im 32/104  soft-tissue]
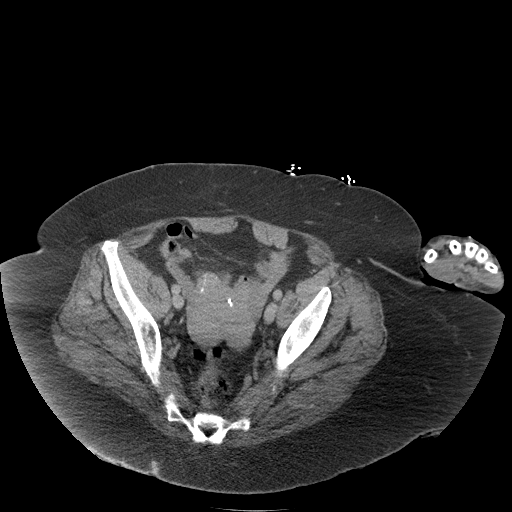
[im 40/104  soft-tissue]
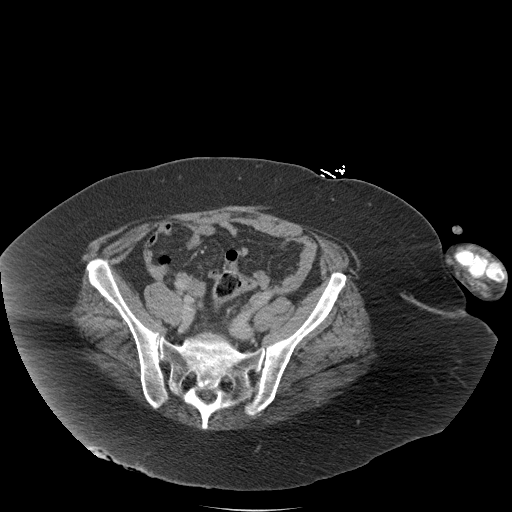
[im 48/104  soft-tissue]
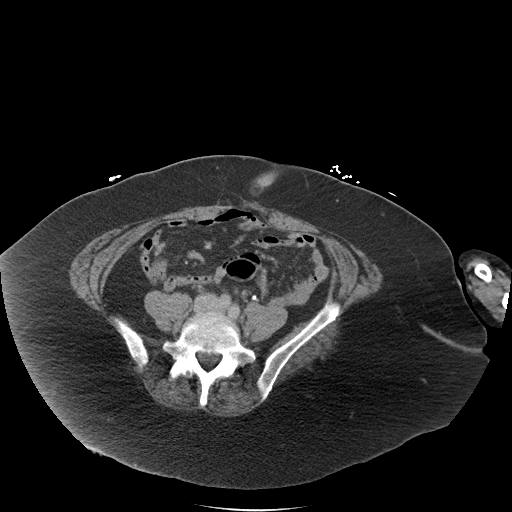
[im 56/104  soft-tissue]
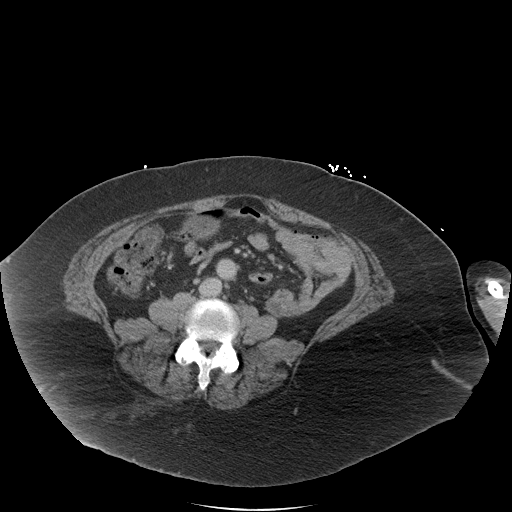
[im 64/104  soft-tissue]
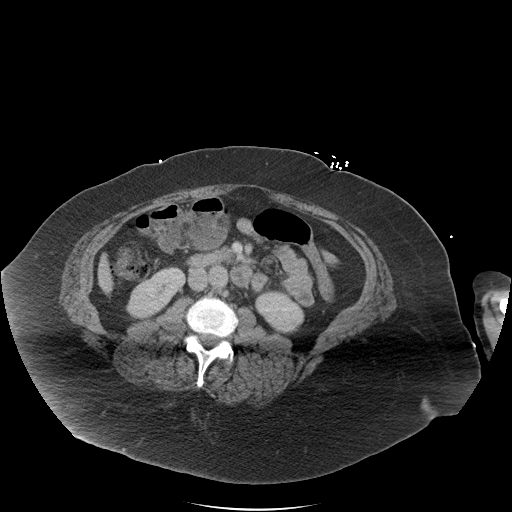
[im 72/104  soft-tissue]
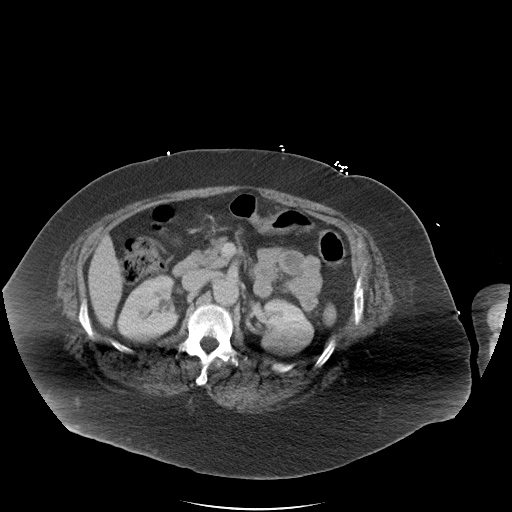
[im 72/104  bone]
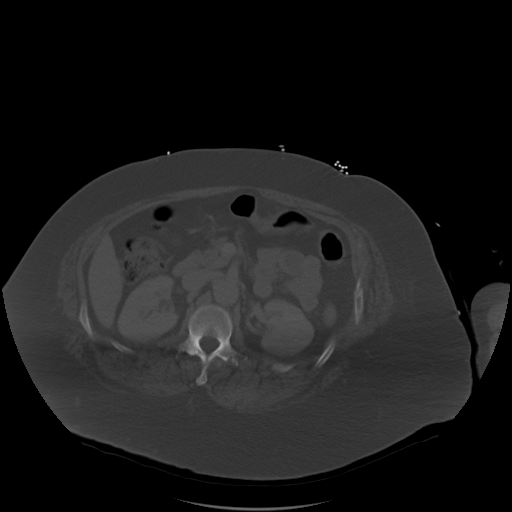
[im 80/104  soft-tissue]
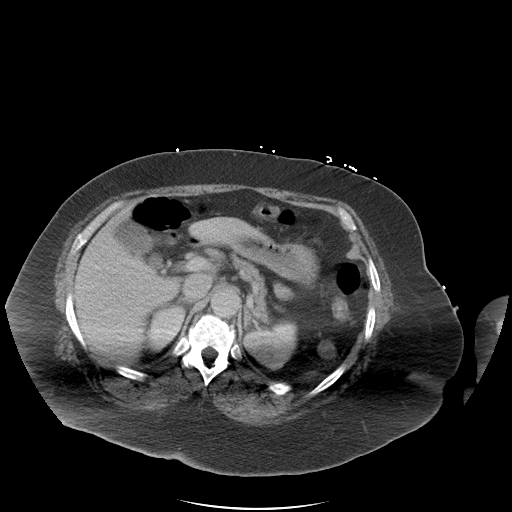
[im 88/104  soft-tissue]
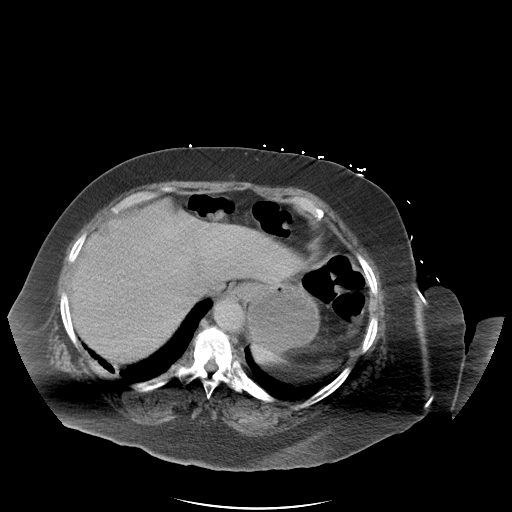
[im 96/104  soft-tissue]
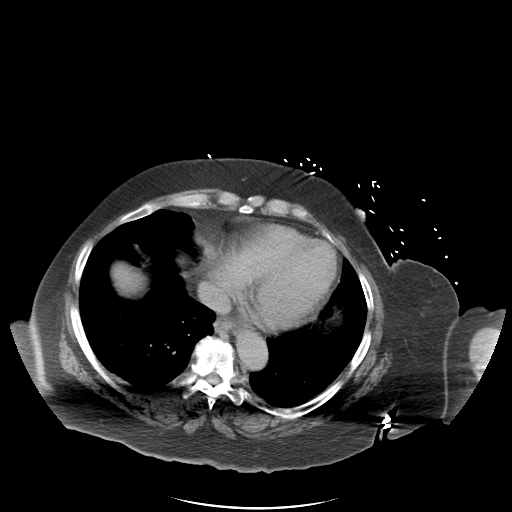

[Series 7: coronal st · coronal · 0.94mm/px · 3 of 115 slices shown]
[im 39/115  soft-tissue]
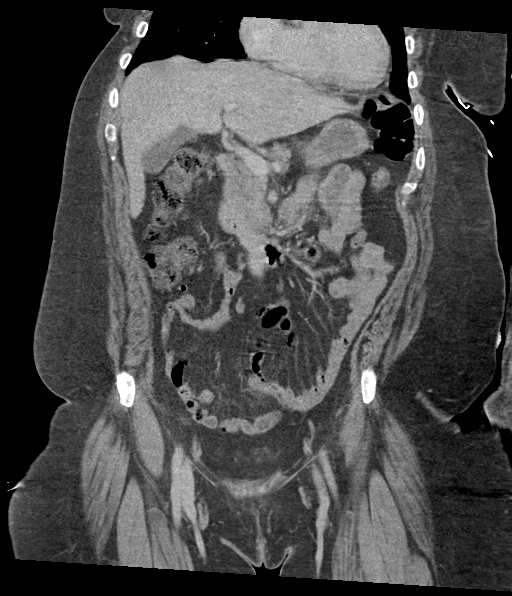
[im 51/115  soft-tissue]
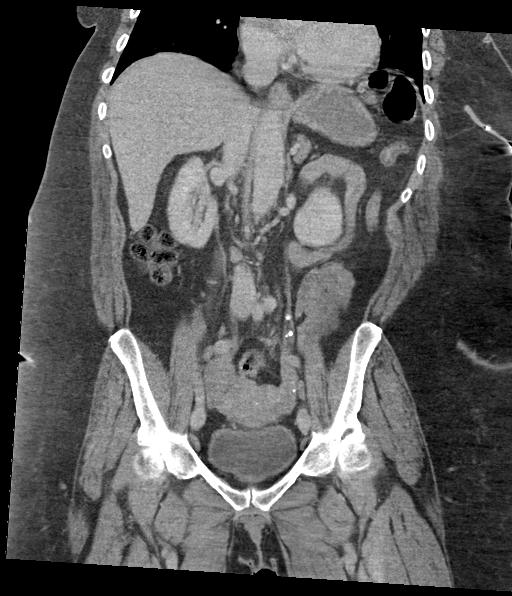
[im 64/115  soft-tissue]
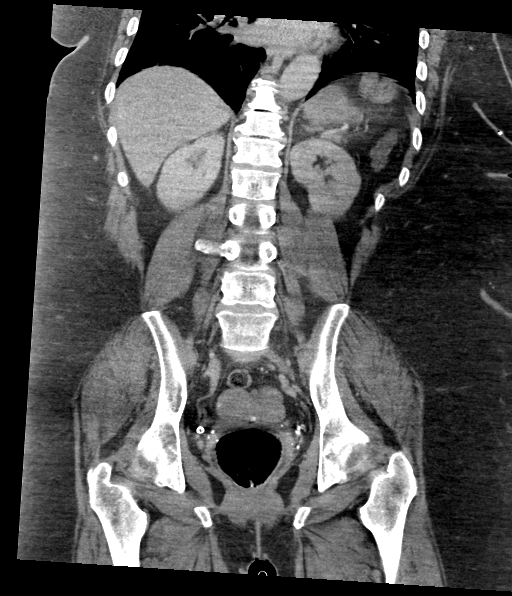

[15 of 46 positions shown; findings below may reference images not displayed]

RADIATION DOSE REDUCTION: This exam was performed according to the
departmental dose-optimization program which includes automated
exposure control, adjustment of the mA and/or kV according to
patient size and/or use of iterative reconstruction technique.

CONTRAST:  100mL OMNIPAQUE IOHEXOL 300 MG/ML  SOLN
FINDINGS: Lower chest: Heart is upper normal in size. Tiny punctate subpleural
nodules in the right lower lobe is unchanged from prior chest CT.
Linear scarring in the dependent right lower lobe. No pleural
effusion.

Hepatobiliary: No focal liver abnormality is seen. Previous
gallstone is not definitively seen on the current exam. No
pericholecystic inflammation. No biliary dilatation.

Pancreas: No ductal dilatation or inflammation.

Spleen: Normal in size without focal abnormality.

Adrenals/Urinary Tract: Normal adrenal glands. No hydronephrosis or
perinephric edema. Homogeneous renal enhancement with symmetric
excretion on delayed phase imaging. No focal renal abnormality.
Urinary bladder is physiologically distended without wall
thickening.

Stomach/Bowel: Unremarkable stomach. No bowel obstruction or
inflammation. Enteric sutures in the sigmoid. High-riding cecum in
the right mid abdomen. Normal appendix.

Vascular/Lymphatic: Normal caliber abdominal aorta. Patent portal
vein. No adenopathy.

Reproductive: Fibroid uterus. No evidence of extra uterine adnexal
mass.

Other: No ascites. No free air. Small fat containing umbilical
hernia. There is no omental thickening.

Musculoskeletal: Moderate left hip osteoarthritis. No evidence of
focal bone lesion.
IMPRESSION: 1. No acute abnormality or explanation for abdominal pain.
2. Fibroid uterus.

## 2022-01-28 IMAGING — CT CT HEAD W/O CM
3 series · 14 of 47 positions shown, 16 images · non-contrast
Comparison: Prior MRI from [DATE].

CLINICAL DATA: Initial evaluation for altered mental status.



[Series 2: head wo · axial · 0.46mm/px · z∈[-97,+28]mm · 8 of 31 slices shown, 10 images]
[im 3/31  brain]
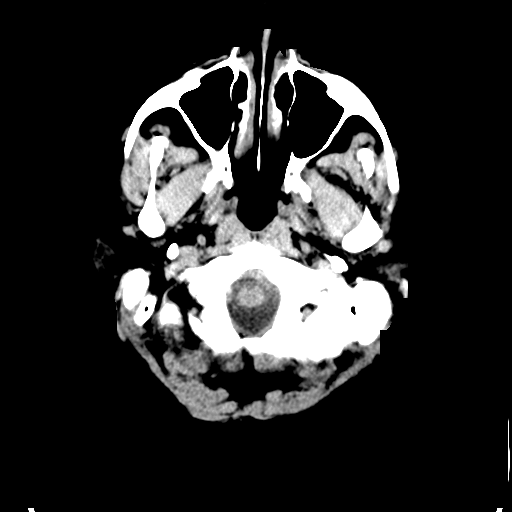
[im 3/31  bone]
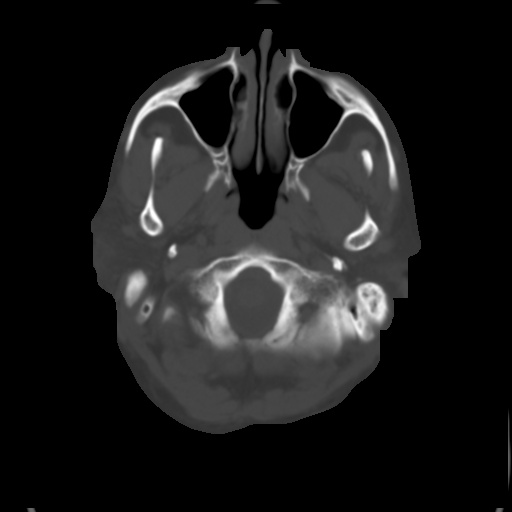
[im 7/31  brain]
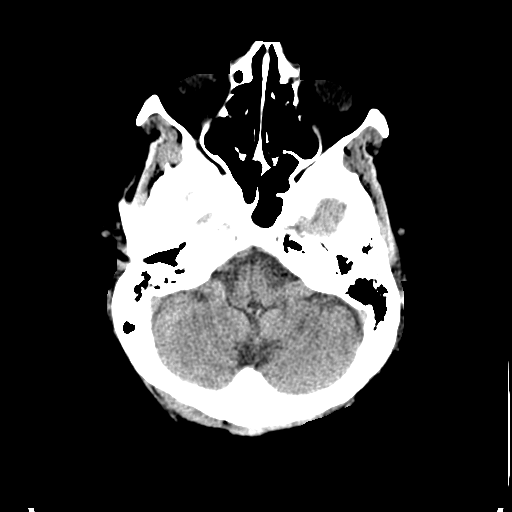
[im 10/31  brain]
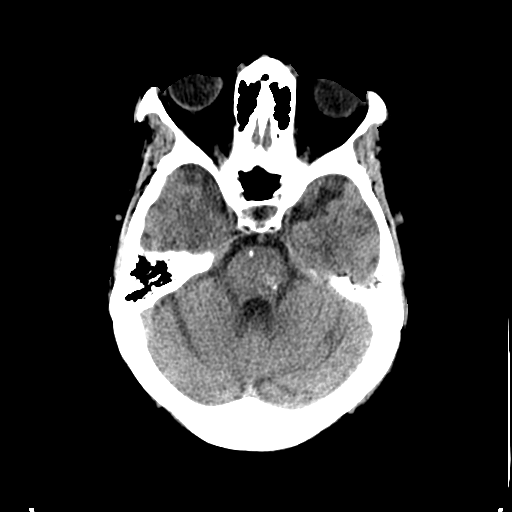
[im 14/31  brain]
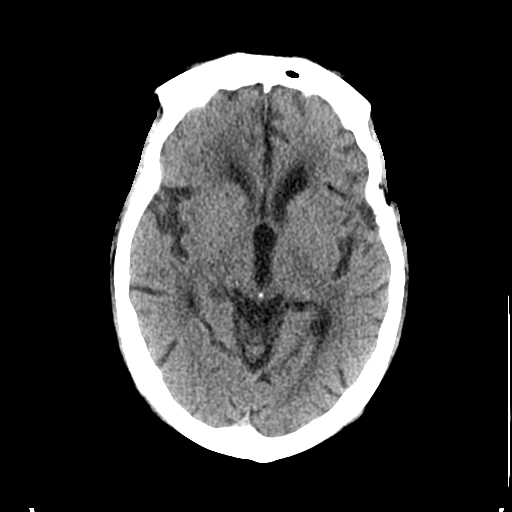
[im 17/31  brain]
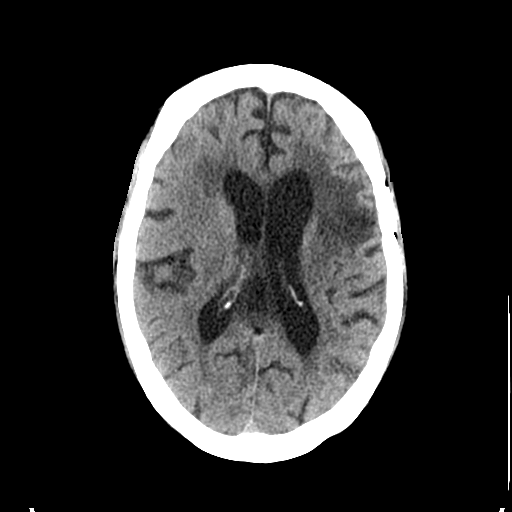
[im 17/31  bone]
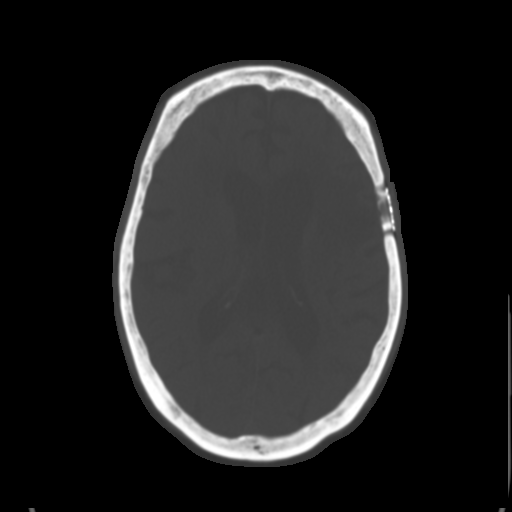
[im 21/31  brain]
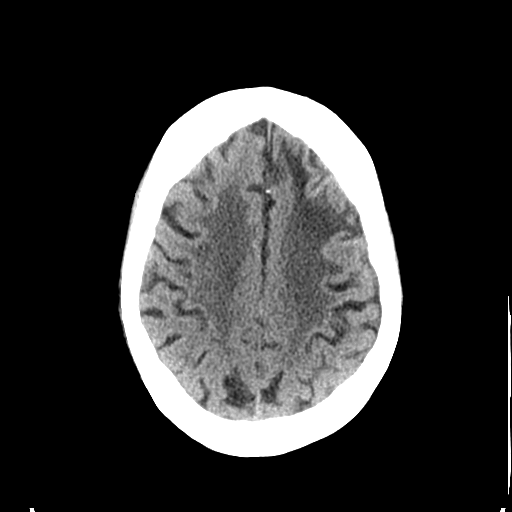
[im 24/31  brain]
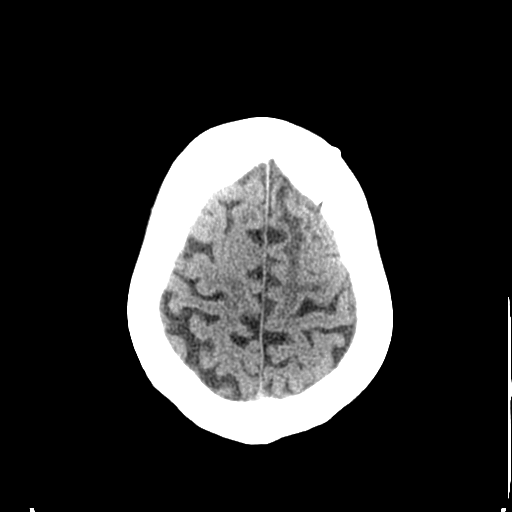
[im 28/31  brain]
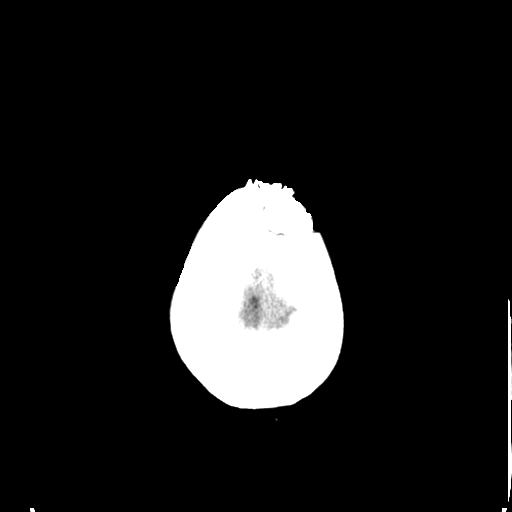

[Series 5: coronal soft tissue · coronal · 0.34mm/px · 3 of 73 slices shown]
[im 25/73  brain]
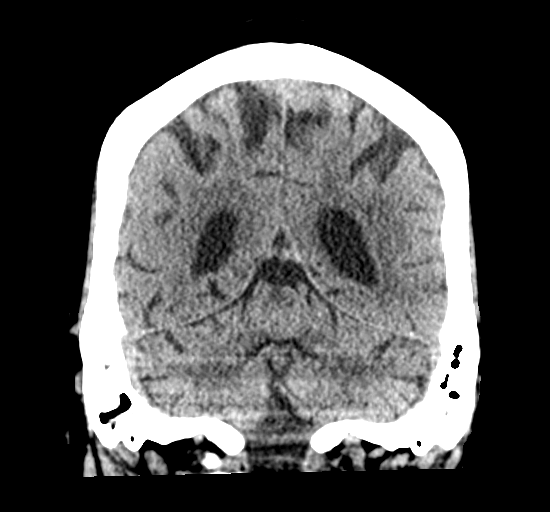
[im 33/73  brain]
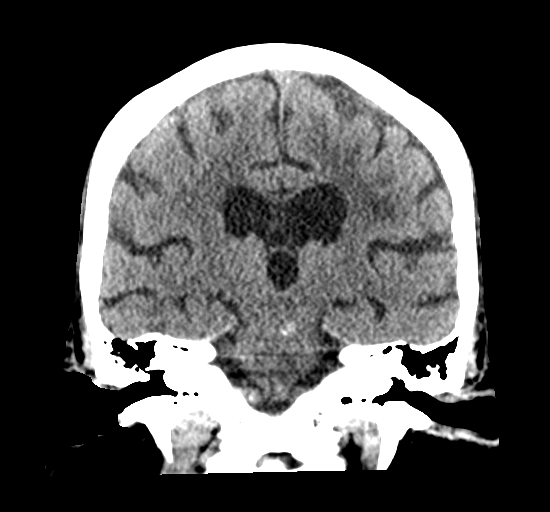
[im 41/73  brain]
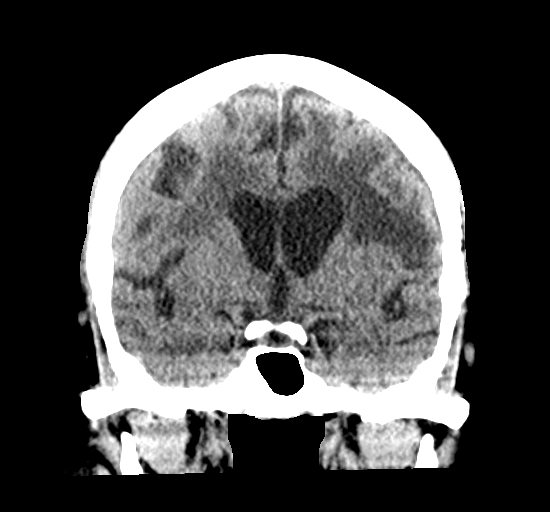

[Series 6: sagittal soft tissue · sagittal · 0.33mm/px · 3 of 59 slices shown]
[im 20/59  brain]
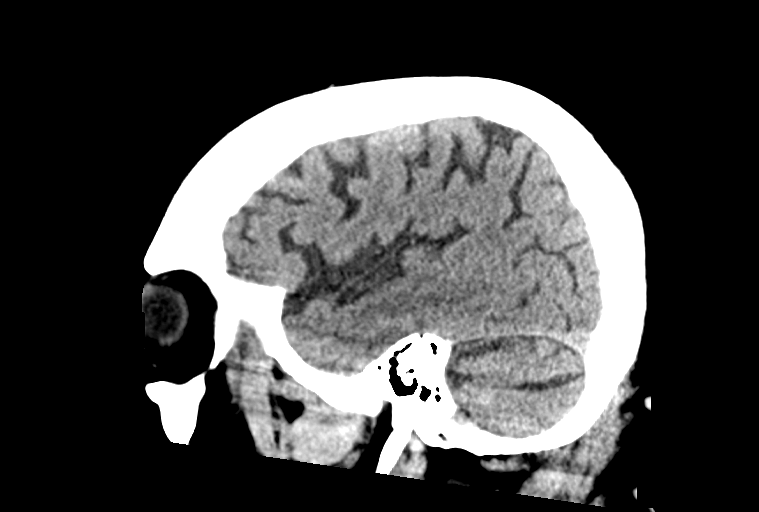
[im 30/59  brain]
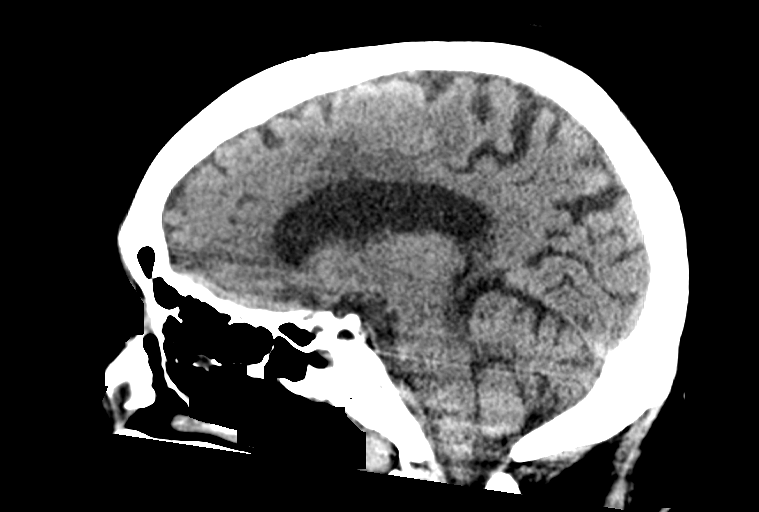
[im 39/59  brain]
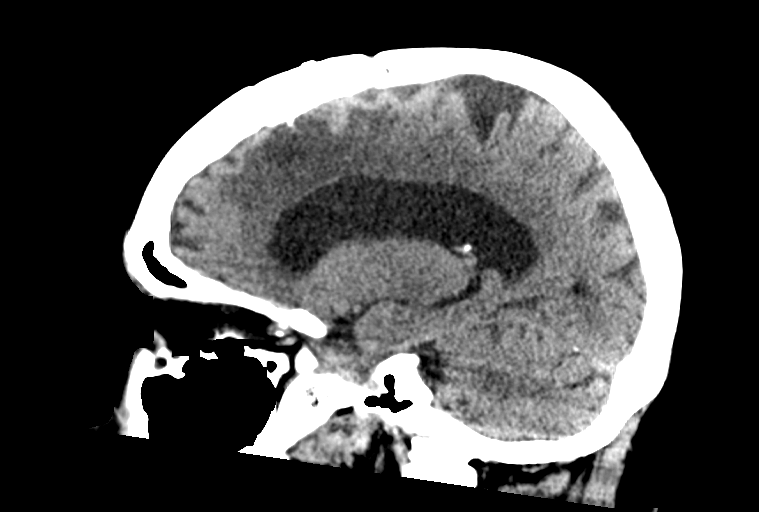

[14 of 47 positions shown; findings below may reference images not displayed]

FINDINGS: Brain: Generalize cerebral atrophy, stable. Postoperative changes
from prior left frontal craniotomy again noted. Underlying
encephalomalacia and gliosis within the underlying left
frontotemporal region, relatively stable. Scattered and confluent
hypodensity elsewhere within the left greater than right
supratentorial cerebral white matter, also stable, likely reflecting
post treatment/post radiation changes. Calcification/mineralization
at the pons noted, stable.

No acute intracranial hemorrhage. No visible large vessel territory
infarct. No visible new mass lesion or metastatic disease. Mass
effect or midline shift. No hydrocephalus or extra-axial fluid
collection.

Vascular: No hyperdense vessel.

Skull: Scalp soft tissues demonstrate no acute finding. Prior left
frontal craniotomy.

Sinuses/Orbits: Globes orbital soft tissues demonstrate no acute
finding. Prior ocular lens replacement on the left. Paranasal
sinuses are clear. Small right greater than left mastoid effusions
noted.

Other: None.
IMPRESSION: 1. No acute intracranial abnormality.
2. Postoperative changes from prior left frontal craniotomy with
underlying encephalomalacia and gliosis within the underlying left
frontotemporal region, stable. Confluent hypodensity elsewhere
within the left greater than right cerebral hemispheres likely
reflecting post radiation changes, stable. No new mass lesion or
metastatic disease evident by CT.
3. Underlying atrophy, stable.

## 2022-01-28 MED ORDER — SODIUM CHLORIDE 0.9% FLUSH: 10.0000 mL | INTRAVENOUS | Status: DC | PRN

## 2022-01-28 MED ORDER — ACETAMINOPHEN 650 MG RE SUPP: 650.0000 mg | Freq: Four times a day (QID) | RECTAL | Status: DC | PRN

## 2022-01-28 MED ORDER — SODIUM CHLORIDE 0.9 % IV BOLUS
1000.0000 mL | Freq: Once | INTRAVENOUS | Status: AC
  Administered 2022-01-28: 1000 mL via INTRAVENOUS

## 2022-01-28 MED ORDER — DIVALPROEX SODIUM 250 MG PO DR TAB
250.0000 mg | DELAYED_RELEASE_TABLET | Freq: Two times a day (BID) | ORAL | Status: DC
  Administered 2022-01-29 – 2022-02-02 (×9): 250 mg via ORAL
  Filled 2022-01-28 (×9): qty 1

## 2022-01-28 MED ORDER — POLYETHYLENE GLYCOL 3350 17 G PO PACK: 17.0000 g | PACK | Freq: Every day | ORAL | Status: DC | PRN

## 2022-01-28 MED ORDER — ACETAMINOPHEN 325 MG PO TABS
650.0000 mg | ORAL_TABLET | Freq: Four times a day (QID) | ORAL | Status: DC | PRN
  Administered 2022-01-29 – 2022-01-31 (×4): 650 mg via ORAL
  Filled 2022-01-28 (×4): qty 2

## 2022-01-28 MED ORDER — GABAPENTIN 300 MG PO CAPS: 300.0000 mg | ORAL_CAPSULE | Freq: Three times a day (TID) | ORAL | Status: DC

## 2022-01-28 MED ORDER — ENOXAPARIN SODIUM 60 MG/0.6ML IJ SOSY
60.0000 mg | PREFILLED_SYRINGE | INTRAMUSCULAR | Status: DC
  Administered 2022-01-29 – 2022-02-02 (×5): 60 mg via SUBCUTANEOUS
  Filled 2022-01-28 (×5): qty 0.6

## 2022-01-28 MED ORDER — AMLODIPINE BESYLATE 5 MG PO TABS
5.0000 mg | ORAL_TABLET | Freq: Every day | ORAL | Status: DC
Start: 2022-01-29 — End: 2022-01-29

## 2022-01-28 MED ORDER — SODIUM CHLORIDE 0.9% FLUSH
3.0000 mL | Freq: Two times a day (BID) | INTRAVENOUS | Status: DC
  Administered 2022-01-29 – 2022-02-02 (×7): 3 mL via INTRAVENOUS

## 2022-01-28 MED ORDER — POTASSIUM CHLORIDE 20 MEQ PO PACK: 40.0000 meq | PACK | Freq: Once | ORAL | Status: DC

## 2022-01-28 MED ORDER — MELATONIN 5 MG PO TABS
10.0000 mg | ORAL_TABLET | Freq: Every evening | ORAL | Status: DC | PRN
  Administered 2022-01-29: 10 mg via ORAL
  Filled 2022-01-28: qty 2

## 2022-01-28 MED ORDER — SODIUM CHLORIDE 0.9 % IV SOLN: INTRAVENOUS | Status: DC

## 2022-01-28 MED ORDER — SODIUM CHLORIDE (PF) 0.9 % IJ SOLN
INTRAMUSCULAR | Status: AC
  Filled 2022-01-28: qty 50

## 2022-01-28 MED ORDER — IOHEXOL 300 MG/ML  SOLN
100.0000 mL | Freq: Once | INTRAMUSCULAR | Status: AC | PRN
  Administered 2022-01-28: 100 mL via INTRAVENOUS

## 2022-01-28 MED ORDER — CHLORHEXIDINE GLUCONATE CLOTH 2 % EX PADS
6.0000 | MEDICATED_PAD | Freq: Every day | CUTANEOUS | Status: DC
  Administered 2022-01-30 – 2022-02-02 (×4): 6 via TOPICAL

## 2022-01-28 MED ORDER — FUROSEMIDE 40 MG PO TABS: 20.0000 mg | ORAL_TABLET | Freq: Every day | ORAL | Status: DC

## 2022-01-28 MED ORDER — VALPROATE SODIUM 100 MG/ML IV SOLN
250.0000 mg | Freq: Once | INTRAVENOUS | Status: AC
  Administered 2022-01-28: 250 mg via INTRAVENOUS
  Filled 2022-01-28: qty 2.5

## 2022-01-28 NOTE — ED Provider Notes (Signed)
[facility] DEPT Provider Note   CSN: [id number] Arrival date & time: 01/28/22  1708     History  No chief complaint on file.   Monica Mccoy is a 56 y.o. 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  Patient with evaluation for similar in April with no significant change on MRI of metastases.  She seems altered at baseline based on previous evaluation.  I will call her sister for  collateral as patient is not alert and oriented at this time.  Patient with increased urination, as well as nausea and vomiting per EMS.  She arrives with foul-smelling urine. She endorses abdominal pain.  Level 5 caveat: AMS  Sister Reports that despite brain metastases she normally is alert and oriented to herself and can follow directions even if she is not normally alert to time or place.  Sister reports several days of nausea, vomiting, dark black stools.  Patient has complained of chills.  She has had a few recent falls, including 1 2 weeks ago where she bumped her head.  She had declined to go to the emergency department at that time.  With her worsening mental status she has had falls yesterday and today, sister denies head injury other significant injuries.  She has endorsed some abdominal pain for the last few days.  She has had several episodes of urinary and fecal incontinence.  HPI     Home Medications Prior to Admission medications   Medication Sig Start Date End Date Taking? Authorizing Provider  acetaminophen (TYLENOL) 500 MG tablet Take 1,000 mg by mouth every 6 (six) hours as needed for mild pain or fever.   Yes [provider]  amLODipine (NORVASC) 5 MG tablet TAKE 1 TABLET (5 MG TOTAL) BY MOUTH DAILY. 10/18/21  Yes Haydee Salter, MD  divalproex (DEPAKOTE) 250 MG DR tablet TAKE 1 TABLET BY MOUTH TWICE A  DAY Patient taking differently: Take 250 mg by mouth 2 (two) times daily. 01/15/22  Yes Vaslow, Acey Lav, MD  DULoxetine (CYMBALTA) 60 MG capsule Take 1 capsule (60 mg total) by mouth 2 (two) times daily. 10/16/21  Yes Haydee Salter, MD  furosemide (LASIX) 20 MG tablet TAKE 1 TABLET BY MOUTH EVERY DAY Patient taking differently: Take 20 mg by mouth 2 (two) times daily. 01/19/22  Yes Haydee Salter, MD  gabapentin (NEURONTIN) 300 MG capsule Take 1 cap in AM, 1 cap in afternoon, 2 caps at bedtime 06/07/21  Yes Cameron Sprang, MD  MELATONIN GUMMIES PO Take 10 mg by mouth.   Yes [provider]  traMADol (ULTRAM) 50 MG tablet TAKE 2 TABLETS BY MOUTH TWICE A DAY AS NEEDED Patient taking differently: Take 100 mg by mouth 2 (two) times daily as needed for moderate pain. 01/11/22  Yes Nicholas Lose, MD  mirabegron ER (MYRBETRIQ) 50 MG TB24 tablet Take 1 tablet (50 mg total) by mouth daily. Patient not taking: Reported on 01/28/2022 05/21/21   Niel Hummer A, MD      Allergies    Dilaudid [hydromorphone hcl], Morphine and related, Penicillins, and Tramadol    Review of Systems   Review of Systems  Reason unable to perform ROS: AMS.  Psychiatric/Behavioral:  Positive for confusion.    Physical Exam Updated Vital Signs BP (!) 146/105 (BP Location: Left Arm)   Pulse 66   Temp  99.5 F (37.5 C) (Rectal)   Resp 17   Ht '5\' 6"'$  (1.676 m)   Wt (!) 138.3 kg   SpO2 100%   BMI 49.23 kg/m  Physical Exam Vitals and nursing note reviewed. Exam conducted with a chaperone present.  Constitutional:      General: She is not in acute distress.    Appearance: Normal appearance. She is obese. She is ill-appearing.  HENT:     Head: Normocephalic and atraumatic.  Eyes:     General:        Right eye: No discharge.        Left eye: No discharge.  Cardiovascular:     Rate and Rhythm: Normal rate and regular rhythm.     Heart sounds: No murmur heard.   No friction rub. No gallop.  Pulmonary:      Effort: Pulmonary effort is normal.     Breath sounds: Normal breath sounds.  Abdominal:     General: Bowel sounds are normal.     Palpations: Abdomen is soft.     Comments: Tenderness to palpation throughout the abdomen  Genitourinary:    Comments: No signs of infection, ulcers on external genitalia Skin:    General: Skin is warm and dry.     Capillary Refill: Capillary refill takes less than 2 seconds.  Neurological:     Mental Status: She is alert and oriented to person, place, and time.  Psychiatric:        Mood and Affect: Mood normal.        Behavior: Behavior normal.    ED Results / Procedures / Treatments   Labs (all labs ordered are listed, but only abnormal results are displayed) Labs Reviewed  COMPREHENSIVE METABOLIC PANEL - Abnormal; Notable for the following components:      Result Value   Potassium 3.4 (*)    Total Protein 8.4 (*)    All other components within normal limits  URINALYSIS, ROUTINE W REFLEX MICROSCOPIC - Abnormal; Notable for the following components:   APPearance CLOUDY (*)    pH 9.0 (*)    All other components within normal limits  LACTIC ACID, PLASMA - Abnormal; Notable for the following components:   Lactic Acid, Venous 2.8 (*)    All other components within normal limits  URINE CULTURE  SARS CORONAVIRUS 2 BY RT PCR  CBC  LIPASE, BLOOD  LACTIC ACID, PLASMA  AMMONIA  VALPROIC ACID LEVEL  MAGNESIUM  TSH  COMPREHENSIVE METABOLIC PANEL  CBC  POC OCCULT BLOOD, ED    EKG None  Radiology DG Chest 2 View  Result Date: 01/28/2022 CLINICAL DATA:  Shortness of breath, weakness and confusion. EXAM: CHEST - 2 VIEW COMPARISON:  Chest radiograph 11/28/2021, CT 09/26/2021 FINDINGS: Low lung volumes. Left chest port remains in place. Upper normal heart size which is likely accentuated by technique and low lung volumes. Increased atelectasis at the right lung base from prior exam. No confluent consolidation. No pneumothorax or large pleural  effusion. Right axillary surgical clips. On limited assessment, no acute osseous abnormalities are seen. IMPRESSION: Low lung volumes with increased right basilar atelectasis. Electronically Signed   By: Keith Rake M.D.   On: 01/28/2022 18:22   CT Head Wo Contrast  Result Date: 01/28/2022 CLINICAL DATA:  Initial evaluation for altered mental status. EXAM: CT HEAD WITHOUT CONTRAST TECHNIQUE: Contiguous axial images were obtained from the base of the skull through the vertex without intravenous contrast. RADIATION DOSE REDUCTION: This exam was performed according  to the departmental dose-optimization program which includes automated exposure control, adjustment of the mA and/or kV according to patient size and/or use of iterative reconstruction technique. COMPARISON:  Prior MRI from 09/26/2021. FINDINGS: Brain: Generalize cerebral atrophy, stable. Postoperative changes from prior left frontal craniotomy again noted. Underlying encephalomalacia and gliosis within the underlying left frontotemporal region, relatively stable. Scattered and confluent hypodensity elsewhere within the left greater than right supratentorial cerebral white matter, also stable, likely reflecting post treatment/post radiation changes. Calcification/mineralization at the pons noted, stable. No acute intracranial hemorrhage. No visible large vessel territory infarct. No visible new mass lesion or metastatic disease. Mass effect or midline shift. No hydrocephalus or extra-axial fluid collection. Vascular: No hyperdense vessel. Skull: Scalp soft tissues demonstrate no acute finding. Prior left frontal craniotomy. Sinuses/Orbits: Globes orbital soft tissues demonstrate no acute finding. Prior ocular lens replacement on the left. Paranasal sinuses are clear. Small right greater than left mastoid effusions noted. Other: None. IMPRESSION: 1. No acute intracranial abnormality. 2. Postoperative changes from prior left frontal craniotomy with  underlying encephalomalacia and gliosis within the underlying left frontotemporal region, stable. Confluent hypodensity elsewhere within the left greater than right cerebral hemispheres likely reflecting post radiation changes, stable. No new mass lesion or metastatic disease evident by CT. 3. Underlying atrophy, stable. Electronically Signed   By: Jeannine Boga M.D.   On: 01/28/2022 22:09   CT ABDOMEN PELVIS W CONTRAST  Result Date: 01/28/2022 CLINICAL DATA:  Acute abdominal pain. Electronic records indicates history of breast cancer. EXAM: CT ABDOMEN AND PELVIS WITH CONTRAST TECHNIQUE: Multidetector CT imaging of the abdomen and pelvis was performed using the standard protocol following bolus administration of intravenous contrast. RADIATION DOSE REDUCTION: This exam was performed according to the departmental dose-optimization program which includes automated exposure control, adjustment of the mA and/or kV according to patient size and/or use of iterative reconstruction technique. CONTRAST:  133m OMNIPAQUE IOHEXOL 300 MG/ML  SOLN COMPARISON:  09/26/2021 FINDINGS: Lower chest: Heart is upper normal in size. Tiny punctate subpleural nodules in the right lower lobe is unchanged from prior chest CT. Linear scarring in the dependent right lower lobe. No pleural effusion. Hepatobiliary: No focal liver abnormality is seen. Previous gallstone is not definitively seen on the current exam. No pericholecystic inflammation. No biliary dilatation. Pancreas: No ductal dilatation or inflammation. Spleen: Normal in size without focal abnormality. Adrenals/Urinary Tract: Normal adrenal glands. No hydronephrosis or perinephric edema. Homogeneous renal enhancement with symmetric excretion on delayed phase imaging. No focal renal abnormality. Urinary bladder is physiologically distended without wall thickening. Stomach/Bowel: Unremarkable stomach. No bowel obstruction or inflammation. Enteric sutures in the sigmoid.  High-riding cecum in the right mid abdomen. Normal appendix. Vascular/Lymphatic: Normal caliber abdominal aorta. Patent portal vein. No adenopathy. Reproductive: Fibroid uterus. No evidence of extra uterine adnexal mass. Other: No ascites. No free air. Small fat containing umbilical hernia. There is no omental thickening. Musculoskeletal: Moderate left hip osteoarthritis. No evidence of focal bone lesion. IMPRESSION: 1. No acute abnormality or explanation for abdominal pain. 2. Fibroid uterus. Electronically Signed   By: MKeith RakeM.D.   On: 01/28/2022 21:48    Procedures Procedures    Medications Ordered in ED Medications  sodium chloride (PF) 0.9 % injection (  Not Given 01/28/22 2052)  sodium chloride flush (NS) 0.9 % injection 10-40 mL (has no administration in time range)  Chlorhexidine Gluconate Cloth 2 % PADS 6 each (has no administration in time range)  valproate (DEPACON) 250 mg in dextrose 5 % 50  mL IVPB (250 mg Intravenous New Bag/Given 01/28/22 2333)  amLODipine (NORVASC) tablet 5 mg (has no administration in time range)  furosemide (LASIX) tablet 20 mg (has no administration in time range)  Melatonin Gummies CHEW 10 mg (has no administration in time range)  divalproex (DEPAKOTE) DR tablet 250 mg (has no administration in time range)  gabapentin (NEURONTIN) capsule 300 mg (has no administration in time range)  enoxaparin (LOVENOX) injection 40 mg (has no administration in time range)  sodium chloride flush (NS) 0.9 % injection 3 mL (has no administration in time range)  0.9 %  sodium chloride infusion (has no administration in time range)  acetaminophen (TYLENOL) tablet 650 mg (has no administration in time range)    Or  acetaminophen (TYLENOL) suppository 650 mg (has no administration in time range)  polyethylene glycol (MIRALAX / GLYCOLAX) packet 17 g (has no administration in time range)  sodium chloride 0.9 % bolus 1,000 mL (1,000 mLs Intravenous New Bag/Given 01/28/22 2052)   iohexol (OMNIPAQUE) 300 MG/ML solution 100 mL (100 mLs Intravenous Contrast Given 01/28/22 2117)    ED Course/ Medical Decision Making/ A&P Clinical Course as of 01/28/22 2346  Sun Jan 28, 2022  1754 Dark black stools, NVD Chills Did fall 2 weeks ago and bumped her head, fell again last night [CP]  2210 Rectal temp 99.5 [CP]    Clinical Course User Index [CP] Anselmo Pickler, PA-C                           Medical Decision Making Amount and/or Complexity of Data Reviewed Labs: ordered. Radiology: ordered.  Risk OTC drugs. Prescription drug management.   This patient is a 56 y.o. female who presents to the ED for concern of altered mental status, urinary and fecal incontinence, dark tarry stools, nausea, vomiting, confusion, this involves an extensive number of treatment options, and is a complaint that carries with it a high risk of complications and morbidity. The emergent differential diagnosis prior to evaluation includes, but is not limited to, new brain metastases, acute intra-abdominal infection, UTI, meningitis, encephalitis, encephalopathy, focal/absence seizure's with postictal period versus other.   This is not an exhaustive differential.   Past Medical History / Co-morbidities / Social History: Patient with breast cancer with metastasis to the brain, history of seizures, urinary incontinence, reflux, fibroids, hypertension, morbid obesity  Additional history: Chart reviewed. Pertinent results include: Reviewed lab work, imaging from previous emergency department visits including extensive work-up with MRI from visit in April which showed no significant new metastases  Physical Exam: Physical exam performed. The pertinent findings include: Patient with altered mental status, some tenderness palpation of the abdomen which is generalized.  Her mental status includes global confusion on arrival with no orientation even to self.  She does have spontaneous limb  movement, and opens eyes, no unilateral or cranial nerve deficits noted grossly.  She has questionable apraxia, versus aphasia versus encephalopathy versus other.  She had some improvement of mental status on repeat evaluation after fluid bolus and time, patient now alert and oriented to herself, can follow commands, but she is still having no realization of urinary incontinence, or orientation to place/situation.  Her family reports that at baseline she does have some confusion but is normally overall alert and oriented, and can perform certain activities, as well as having awareness of her surroundings, without urinary incontinence or other abnormalities.  They describe a significant change for the last few days.  Family denies any clear seizure activity.  Lab Tests: I ordered, and personally interpreted labs.  The pertinent results include: Patient with elevated lactic acid with no clear source of infection or significant fever.  This raises suspicion for potential focal/nonspecific seizure activity.  We will administer fluid bolus.  Urinalysis does not red flag to urinary tract infection despite foul smell.  CMP unremarkable.  CBC overall unremarkable.  Lipase is normal at this time.  Her Hemoccult is negative, no evidence of GI bleed.   Imaging Studies: I ordered imaging studies including CT head without contrast, CT abdomen pelvis without contrast, plain film x-ray of the chest. I independently visualized and interpreted imaging which showed low lung volumes on chest x-ray without evidence of focal consolidation, pneumonia.  CT abdomen/pelvis shows fibroids without any other new changes, infection, or explanation for abdominal pain.  CT head shows postoperative changes, without evidence of clear new metastasis. I agree with the radiologist interpretation.   Medications: I ordered medication including valproate for seizure prophylaxis, fluid bolus for lactic acidosis.  On reassessment patient does seem  to have some improvement of her overall cognitive state but she is still globally confused, is difficult to assess compared to her baseline.  She has not shown any clear seizure or postictal activity during her hospital stay so far.  Consultations Obtained: I requested consultation with the hospitalist, spoke with Dr. Trilby Drummer,  and discussed lab and imaging findings as well as pertinent plan - they recommend: admission My attending physician spoke to neurologist who agrees that patient should have MRI with and without contrast in the morning, seizure prophylaxis, and continued monitoring as well as hospitalization.   Disposition: After consideration of the diagnostic results and the patients response to treatment, I feel that patient meets admission criteria as discussed above for altered mental status, possible testes, possible nonfocal seizure findings.  Neurology will consult, they feel comfortable with patient staying at [facility] long at this time, low concern for acute stroke on the differential.   I discussed this case with my attending physician Dr. Billy Fischer who cosigned this note including patient's presenting symptoms, physical exam, and planned diagnostics and interventions. Attending physician stated agreement with plan or made changes to plan which were implemented.    Final Clinical Impression(s) / ED Diagnoses Final diagnoses:  None    Rx / DC Orders ED Discharge Orders     None         Dorien Chihuahua 01/28/22 2346    Gareth Morgan, MD 01/29/22 2203

## 2022-01-28 NOTE — H&P (Signed)
History and Physical   Lyncoln Mccoy UXN:[id number] DOB: February 17, 1966 DOA: 01/28/2022  PCP: Haydee Salter, MD   Patient coming from: Home  Chief Complaint: Confusion, weakness  HPI: Monica Mccoy is a 56 y.o. 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  History obtained with assistance of chart review and family due to patient's baseline confusion and recent worsening of this confusion.  Patient has had increased weakness and confusion for the past several days.  Per family, her baseline is alert and oriented to self only (not time or place).  But is able to follow directions and express needs.  She has had some recent falls including hitting her head a couple weeks ago.  Family also reports 2 to 3 days of ongoing nausea, vomiting, dark stools and some abdominal pain.  Patient also recently started tramadol.  Patient with somewhat similar episodes in January and April.  In January she was admitted with resolution while admitted without clear source.  Similarly in April her symptoms improved while in the ED and was discharged from the ED.  Both times MRI of the brain were stable.  No other reported new symptoms such as fever, chills, chest pain, shortness of breath.***Unable to obtain full review of systems due to patient's altered mentation.  ED Course: Vital signs in the ED significant for blood pressure in the 254Y to 706 systolic.  Lab work-up included CMP with potassium 3.4, protein 8.4.  CBC within normal limits.  Lactic acid elevated 2.8 with repeat pending.  Lipase level normal.  FOBT negative.  COVID test pending.  Urinalysis without acute abnormality.  Other than chronically elevated pH.  Urine culture pending.  Valproic acid level pending.  Ammonia level pending.  Patient received a liter of fluids and IV valproic acid in the ED.   Case was discussed with neurology in the ED who will be consulting per EDP.  They recommended evaluation for possible new mets versus CVA versus focal seizure.  They recommended MRI brain with and without contrast and will see the patient tomorrow.  They felt patient was appropriate to remain at [facility].  Review of Systems: As per HPI otherwise all other systems reviewed and are negative. ***  Past Medical History:  Diagnosis Date   Anxiety    Breast cancer metastasized to brain [facility])    Left breast   Chronic pain after cancer treatment    Depression    GERD (gastroesophageal reflux disease)    History of cancer chemotherapy    Breast cancer left breast   Seizure [facility])     Past Surgical History:  Procedure Laterality Date   BRAIN SURGERY     brain tumor   MASTECTOMY Bilateral    ORIF ANKLE FRACTURE Right     Social History  reports that she has never smoked. She has never used smokeless tobacco. She reports current alcohol use. She reports current drug use. Drug: Marijuana.  Allergies  Allergen Reactions   Dilaudid [Hydromorphone Hcl] Other (See Comments)    Confusion & hallucinations   Morphine And Related Other (See Comments)    HA   Penicillins    Tramadol     Family History  Problem Relation Age of Onset   Obesity Mother    Hypertension Mother    Hyperlipidemia Mother    Diabetes Mother    Heart disease Mother   Reviewed on  admission  Prior to Admission medications   Medication Sig Start Date End Date Taking? Authorizing Provider  acetaminophen (TYLENOL) 500 MG tablet Take 1,000 mg by mouth every 6 (six) hours as needed for mild pain or fever.   Yes [provider]  amLODipine (NORVASC) 5 MG tablet TAKE 1 TABLET (5 MG TOTAL) BY MOUTH DAILY. 10/18/21  Yes Haydee Salter, MD  divalproex (DEPAKOTE) 250 MG DR tablet TAKE 1 TABLET BY MOUTH TWICE A DAY Patient taking differently: Take 250 mg by mouth 2 (two) times daily. 01/15/22  Yes Vaslow,  Acey Lav, MD  DULoxetine (CYMBALTA) 60 MG capsule Take 1 capsule (60 mg total) by mouth 2 (two) times daily. 10/16/21  Yes Haydee Salter, MD  furosemide (LASIX) 20 MG tablet TAKE 1 TABLET BY MOUTH EVERY DAY Patient taking differently: Take 20 mg by mouth 2 (two) times daily. 01/19/22  Yes Haydee Salter, MD  gabapentin (NEURONTIN) 300 MG capsule Take 1 cap in AM, 1 cap in afternoon, 2 caps at bedtime 06/07/21  Yes Cameron Sprang, MD  MELATONIN GUMMIES PO Take 10 mg by mouth.   Yes [provider]  traMADol (ULTRAM) 50 MG tablet TAKE 2 TABLETS BY MOUTH TWICE A DAY AS NEEDED Patient taking differently: Take 100 mg by mouth 2 (two) times daily as needed for moderate pain. 01/11/22  Yes Nicholas Lose, MD  mirabegron ER (MYRBETRIQ) 50 MG TB24 tablet Take 1 tablet (50 mg total) by mouth daily. Patient not taking: Reported on 01/28/2022 05/21/21   Elmarie Shiley, MD    Physical Exam: Vitals:   01/28/22 2000 01/28/22 2015 01/28/22 2045 01/28/22 2209  BP: (!) 162/133  (!) 146/105   Pulse:  70 66   Resp:   17   Temp:   (!) 97.3 F (36.3 C) 99.5 F (37.5 C)  TempSrc:   Oral Rectal  SpO2:  100% 100%   Weight:      Height:        Physical Exam Constitutional:      General: She is not in acute distress.    Appearance: Normal appearance.  HENT:     Head: Normocephalic and atraumatic.     Mouth/Throat:     Mouth: Mucous membranes are moist.     Pharynx: Oropharynx is clear.  Eyes:     Extraocular Movements: Extraocular movements intact.     Pupils: Pupils are equal, round, and reactive to light.  Cardiovascular:     Rate and Rhythm: Normal rate and regular rhythm.     Pulses: Normal pulses.     Heart sounds: Normal heart sounds.  Pulmonary:     Effort: Pulmonary effort is normal. No respiratory distress.     Breath sounds: Normal breath sounds.  Abdominal:     General: Bowel sounds are normal. There is no distension.     Palpations: Abdomen is soft.     Tenderness: There  is no abdominal tenderness.  Musculoskeletal:        General: No swelling or deformity.  Skin:    General: Skin is warm and dry.  Neurological:     General: No focal deficit present.     Mental Status: Mental status is at baseline.     Comments: Alert and oriented to self only.  Which is baseline.  ***  Labs on Admission: I have personally reviewed following labs and imaging studies  CBC: Recent Labs  Lab 01/28/22 1758  WBC 5.1  HGB 12.8  HCT 41.3  MCV 94.1  PLT 034    Basic Metabolic Panel: Recent Labs  Lab 01/28/22 1758  NA 137  K 3.4*  CL 103  CO2 23  GLUCOSE 91  BUN 8  CREATININE 0.73  CALCIUM 9.3    GFR: Estimated Creatinine Clearance: 114 mL/min (by C-G formula based on SCr of 0.73 mg/dL).  Liver Function Tests: Recent Labs  Lab 01/28/22 1758  AST 19  ALT 10  ALKPHOS 66  BILITOT 1.0  PROT 8.4*  ALBUMIN 3.9    Urine analysis:    Component Value Date/Time   COLORURINE YELLOW 01/28/2022 1816   APPEARANCEUR CLOUDY (A) 01/28/2022 1816   LABSPEC 1.009 01/28/2022 1816   PHURINE 9.0 (H) 01/28/2022 1816   GLUCOSEU NEGATIVE 01/28/2022 1816   HGBUR NEGATIVE 01/28/2022 1816   BILIRUBINUR NEGATIVE 01/28/2022 1816   KETONESUR NEGATIVE 01/28/2022 1816   PROTEINUR NEGATIVE 01/28/2022 1816   NITRITE NEGATIVE 01/28/2022 1816   LEUKOCYTESUR NEGATIVE 01/28/2022 1816    Radiological Exams on Admission: DG Chest 2 View  Result Date: 01/28/2022 CLINICAL DATA:  Shortness of breath, weakness and confusion. EXAM: CHEST - 2 VIEW COMPARISON:  Chest radiograph 11/28/2021, CT 09/26/2021 FINDINGS: Low lung volumes. Left chest port remains in place. Upper normal heart size which is likely accentuated by technique and low lung volumes. Increased atelectasis at the right lung base from prior exam. No confluent consolidation. No pneumothorax or large pleural effusion. Right axillary surgical clips. On limited assessment, no acute osseous abnormalities are seen. IMPRESSION:  Low lung volumes with increased right basilar atelectasis. Electronically Signed   By: Keith Rake M.D.   On: 01/28/2022 18:22   CT Head Wo Contrast  Result Date: 01/28/2022 CLINICAL DATA:  Initial evaluation for altered mental status. EXAM: CT HEAD WITHOUT CONTRAST TECHNIQUE: Contiguous axial images were obtained from the base of the skull through the vertex without intravenous contrast. RADIATION DOSE REDUCTION: This exam was performed according to the departmental dose-optimization program which includes automated exposure control, adjustment of the mA and/or kV according to patient size and/or use of iterative reconstruction technique. COMPARISON:  Prior MRI from 09/26/2021. FINDINGS: Brain: Generalize cerebral atrophy, stable. Postoperative changes from prior left frontal craniotomy again noted. Underlying encephalomalacia and gliosis within the underlying left frontotemporal region, relatively stable. Scattered and confluent hypodensity elsewhere within the left greater than right supratentorial cerebral white matter, also stable, likely reflecting post treatment/post radiation changes. Calcification/mineralization at the pons noted, stable. No acute intracranial hemorrhage. No visible large vessel territory infarct. No visible new mass lesion or metastatic disease. Mass effect or midline shift. No hydrocephalus or extra-axial fluid collection. Vascular: No hyperdense vessel. Skull: Scalp soft tissues demonstrate no acute finding. Prior left frontal craniotomy. Sinuses/Orbits: Globes orbital soft tissues demonstrate no acute finding. Prior ocular lens replacement on the left. Paranasal sinuses are clear. Small right greater than left mastoid effusions noted. Other: None. IMPRESSION: 1. No acute intracranial abnormality. 2. Postoperative changes from prior left frontal craniotomy with underlying encephalomalacia and gliosis within the underlying left frontotemporal region, stable. Confluent hypodensity  elsewhere within the left greater than right cerebral hemispheres likely reflecting post radiation changes, stable. No new mass lesion or metastatic disease evident by CT. 3. Underlying atrophy, stable. Electronically Signed   By: Jeannine Boga M.D.   On: 01/28/2022 22:09   CT ABDOMEN PELVIS W CONTRAST  Result Date: 01/28/2022 CLINICAL DATA:  Acute abdominal pain. Electronic records indicates history of breast cancer. EXAM: CT ABDOMEN AND PELVIS WITH  CONTRAST TECHNIQUE: Multidetector CT imaging of the abdomen and pelvis was performed using the standard protocol following bolus administration of intravenous contrast. RADIATION DOSE REDUCTION: This exam was performed according to the departmental dose-optimization program which includes automated exposure control, adjustment of the mA and/or kV according to patient size and/or use of iterative reconstruction technique. CONTRAST:  160m OMNIPAQUE IOHEXOL 300 MG/ML  SOLN COMPARISON:  09/26/2021 FINDINGS: Lower chest: Heart is upper normal in size. Tiny punctate subpleural nodules in the right lower lobe is unchanged from prior chest CT. Linear scarring in the dependent right lower lobe. No pleural effusion. Hepatobiliary: No focal liver abnormality is seen. Previous gallstone is not definitively seen on the current exam. No pericholecystic inflammation. No biliary dilatation. Pancreas: No ductal dilatation or inflammation. Spleen: Normal in size without focal abnormality. Adrenals/Urinary Tract: Normal adrenal glands. No hydronephrosis or perinephric edema. Homogeneous renal enhancement with symmetric excretion on delayed phase imaging. No focal renal abnormality. Urinary bladder is physiologically distended without wall thickening. Stomach/Bowel: Unremarkable stomach. No bowel obstruction or inflammation. Enteric sutures in the sigmoid. High-riding cecum in the right mid abdomen. Normal appendix. Vascular/Lymphatic: Normal caliber abdominal aorta. Patent  portal vein. No adenopathy. Reproductive: Fibroid uterus. No evidence of extra uterine adnexal mass. Other: No ascites. No free air. Small fat containing umbilical hernia. There is no omental thickening. Musculoskeletal: Moderate left hip osteoarthritis. No evidence of focal bone lesion. IMPRESSION: 1. No acute abnormality or explanation for abdominal pain. 2. Fibroid uterus. Electronically Signed   By: MKeith RakeM.D.   On: 01/28/2022 21:48    EKG: Independently reviewed. Updated at bedside updated at bedsideAlert and oriented to self only.  Sinus rhythm at 74 bpm.  Evidence of LVH.  Baseline artifact.  Nonspecific T wave flattening.  Assessment/Plan Principal Problem:   Acute encephalopathy Active Problems:   Gastroesophageal reflux disease without esophagitis   Other chronic pain   Moderate episode of recurrent major depressive disorder (HCC)   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[facility]   Essential hypertension   Chemotherapy-induced neuropathy (HCC)   Localization-related (focal) (partial) symptomatic epilepsy and epileptic syndromes with complex partial seizures, not intractable, [place]pticus [place]  Seizure disorder Acute encephalopathy > Acute worsening of chronic confusion in the setting of known brain mets from breast cancer. > 3 days of worsening confusion and weakness. > Has improved somewhat in the ED but not yet back to baseline. > Patient reports some nausea vomiting and abdominal pain as well as dark stools recently. > Neurology consulted in ED. MRI brain as part of work-up to evaluate for new mets or CVA versus seizures. [facility] > Received a liter of fluids and a dose of IV valproic acid in the ED.  Blood work acid level, ammonia level, urine culture pending.  Other lab work and imaging without evidence of infection including normal CBC, no evidence of infection on chest x-ray, no explanation  for symptoms on CT of the abdomen pelvis. > Hemoccult negative and no current explanation for dark stools. - Appreciate neurology recommendations - Monitor on telemetry - Correct potassium, check magnesium - Check TSH - Continue home valproic acid - MRI with and without contrast - EEG - Follow-up valproic acid level, ammonia, urine culture - Hold centrally acting medications including tramadol, duloxetine, gabapentin (will continue gabapentin tomorrow considering its antiepileptic properties) - Trend lactic acid unclear if this is elevated due to seizure activity versus dehydration versus other.  Hypokalemia > Mild hypokalemia with potassium 3.4 - 40 mEq p.o. potassium - Check magnesium  Breast cancer Brain metastasis > Follows with oncology outpatient. - Currently on Herceptin  Hypertension - Continue home amlodipine and Lasix  Depression - Holding home duloxetine as above  Chronic pain - Holding home tramadol as above   DVT prophylaxis: Lovenox Code Status:   Full Family Communication:  ***  Disposition Plan:   Patient is from:  Home  Anticipated DC to:  Home  Anticipated DC date:  1 to 3 days  Anticipated DC barriers: None  Consults called:  Neurology, consulted by EDP, will be following per EDP Admission status:  Observation, telemetry  Severity of Illness: The appropriate patient status for this patient is OBSERVATION. Observation status is judged to be reasonable and necessary in order to provide the required intensity of service to ensure the patient's safety. The patient's presenting symptoms, physical exam findings, and initial radiographic and laboratory data in the context of their medical condition is felt to place them at decreased risk for further clinical deterioration. Furthermore, it is anticipated that the patient will be medically stable for discharge from the hospital within 2 midnights of admission.    Marcelyn Bruins MD [facility]  How to contact the [facility] Attending or Consulting provider [place] or covering provider during after hours [place], for this patient?   Check the care team in [facility] and look for a) attending/consulting [facility] provider listed and b) the [facility] team listed Log into [email or web address] and use [place]'s universal password to access. If you do not have the password, please contact the hospital operator. Locate the [facility] provider you are looking for under [facility] and page to a number that you can be directly reached. If you still have difficulty reaching the provider, please page the [facility] (Director on Call) for the Hospitalists listed on amion for assistance.  01/28/2022, 11:46 PM

## 2022-01-28 NOTE — Progress Notes (Signed)
Spoke with primary RN which states she is able to access port, but has not been able to yet. RN notified that IV Team with multiple calls at this time as well.

## 2022-01-28 NOTE — ED Triage Notes (Signed)
Pt BIB [facility] from home C/O increased weakness and confusion X 3 days. Per EMS, pt has also had increased urination and nausea/vomiting today. Hx breast cancer, brain tumor.

## 2022-01-29 ENCOUNTER — Other Ambulatory Visit: Payer: Self-pay

## 2022-01-29 ENCOUNTER — Observation Stay (HOSPITAL_COMMUNITY)
Admit: 2022-01-29 | Discharge: 2022-01-29 | Disposition: A | Discharge status: Home / Self Care | Payer: Medicare Other | Attending: Internal Medicine | Admitting: Internal Medicine

## 2022-01-29 ENCOUNTER — Observation Stay (HOSPITAL_COMMUNITY): Payer: Medicare Other

## 2022-01-29 DIAGNOSIS — I10 Essential (primary) hypertension: Secondary | ICD-10-CM | POA: Diagnosis not present

## 2022-01-29 DIAGNOSIS — G9340 Encephalopathy, unspecified: Secondary | ICD-10-CM | POA: Diagnosis not present

## 2022-01-29 LAB — SARS CORONAVIRUS 2 BY RT PCR: SARS Coronavirus 2 by RT PCR: NEGATIVE

## 2022-01-29 LAB — VALPROIC ACID LEVEL: Valproic Acid Lvl: <10 ug/mL — ABNORMAL LOW (ref 50.0–100.0)

## 2022-01-29 LAB — AMMONIA: Ammonia: 32 umol/L (ref 9–35)

## 2022-01-29 LAB — LACTIC ACID, PLASMA: Lactic Acid, Venous: 0.9 mmol/L (ref 0.5–1.9)

## 2022-01-29 IMAGING — MR MR HEAD WO/W CM
12 of 13 series · 35 of 48 positions shown · IV contrast (gadavist)
Comparison: [DATE]

CLINICAL DATA: Altered mental status, nontraumatic; history of
metastatic breast cancer

EXAM:
MRI HEAD WITHOUT AND WITH CONTRAST
TECHNIQUE: Multiplanar, multiecho pulse sequences of the brain and surrounding
structures were obtained without and with intravenous contrast.
CONTRAST:  10mL GADAVIST GADOBUTROL 1 MMOL/ML IV SOLN

[Series 5: dwi_tracew · axial · 3.0mm · 1.08mm/px · z∈[+9,+24]mm · 2 of 102 slices shown]
[im 1/102]
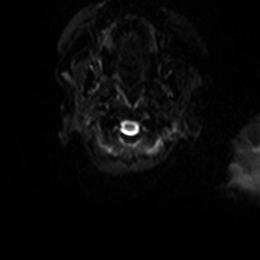
[im 12/102]
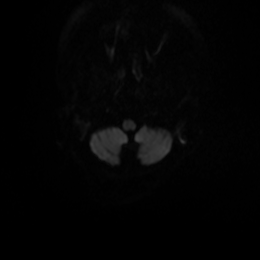

[Series 7: T2 · sagittal · 5.0mm · 0.47mm/px · 2 of 24 slices shown (1 of 2)]
[im 1/24]
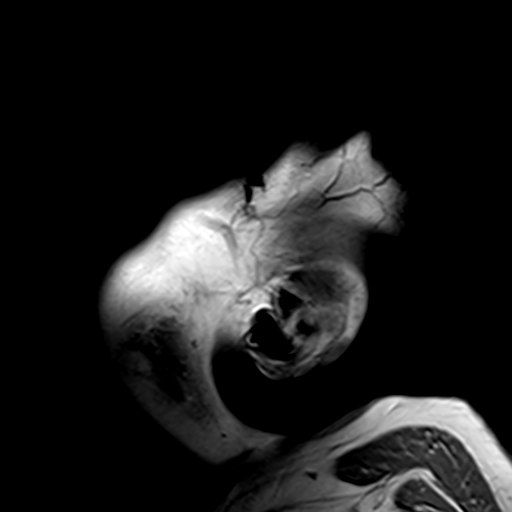
[im 24/24]
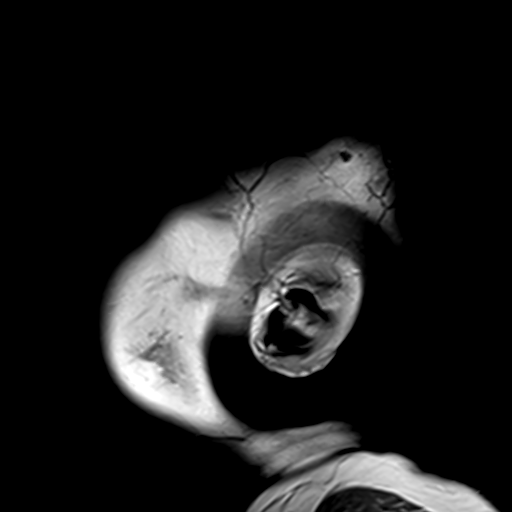

[Series 8: T2 · axial · 5.0mm · 0.45mm/px · z∈[+3,+148]mm · 2 of 24 slices shown (2 of 2)]
[im 1/24]
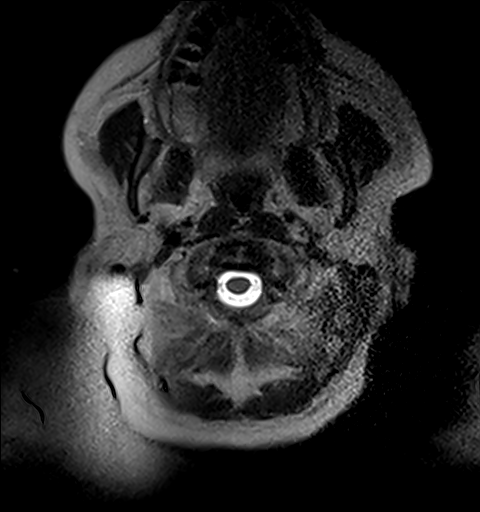
[im 24/24]
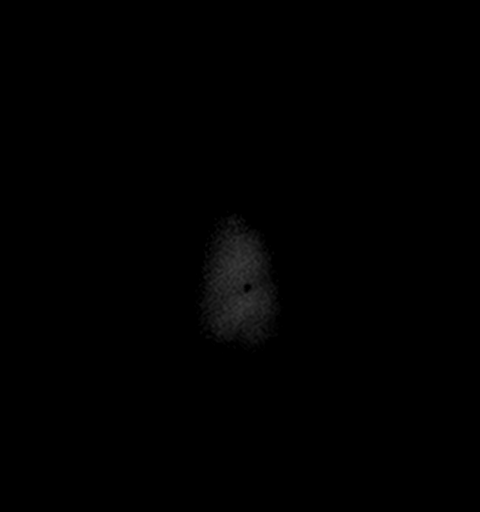

[Series 9: GRE · axial · 3.0mm · 0.45mm/px · z∈[-3,+143]mm · 4 of 51 slices shown]
[im 1/51]
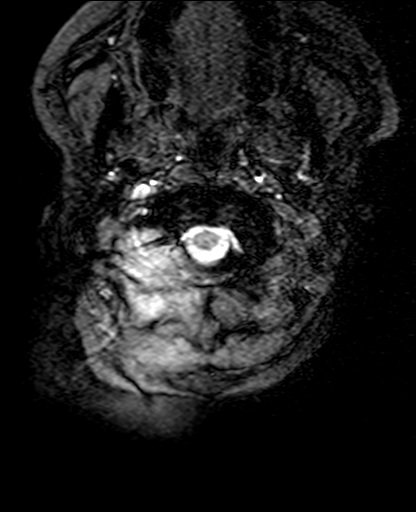
[im 17/51]
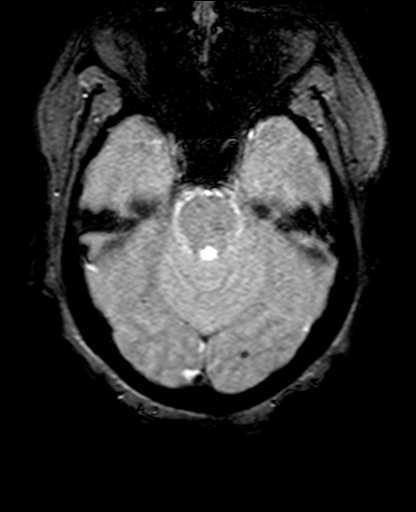
[im 34/51]
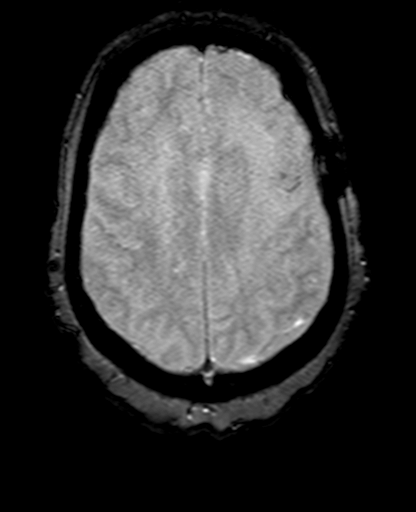
[im 51/51]
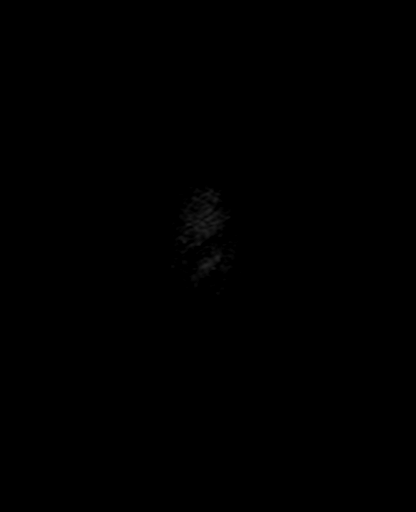

[Series 10: FLAIR · axial · 3.0mm · 0.86mm/px · z∈[-1,+144]mm · 4 of 51 slices shown]
[im 1/51]
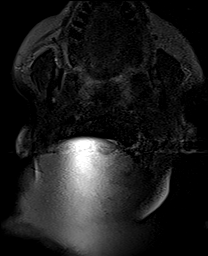
[im 17/51]
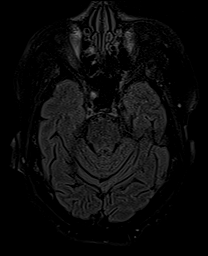
[im 34/51]
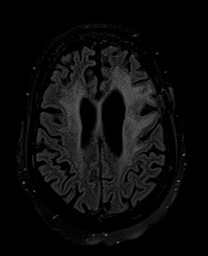
[im 51/51]
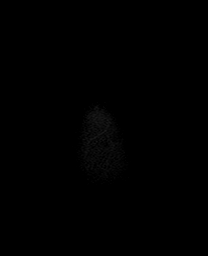

[Series 11: T1 · axial · 3.0mm · 0.45mm/px · z∈[-3,+143]mm · 4 of 51 slices shown]
[im 1/51]
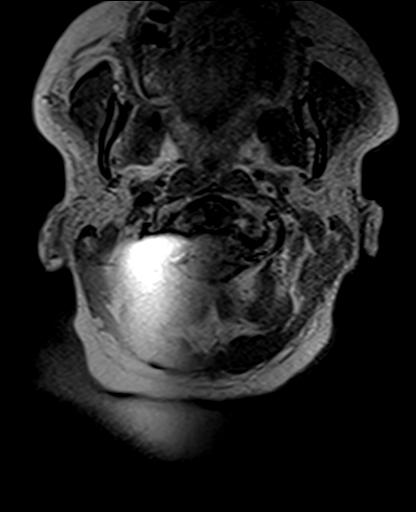
[im 17/51]
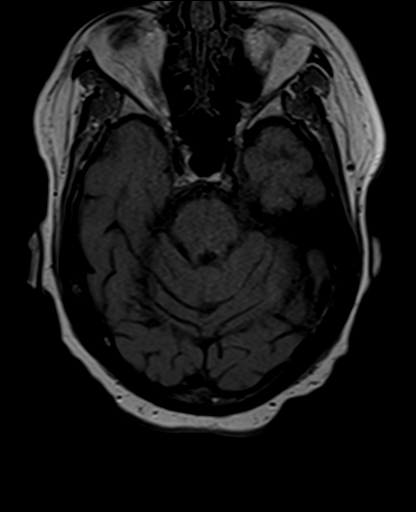
[im 34/51]
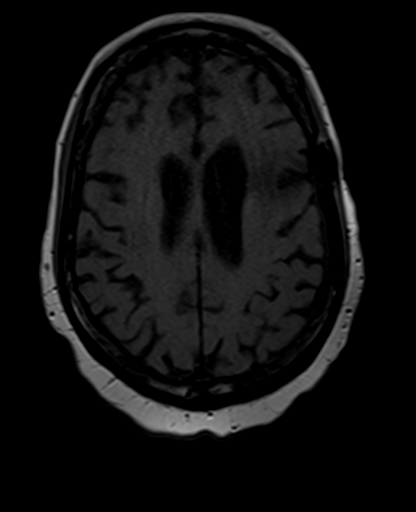
[im 51/51]
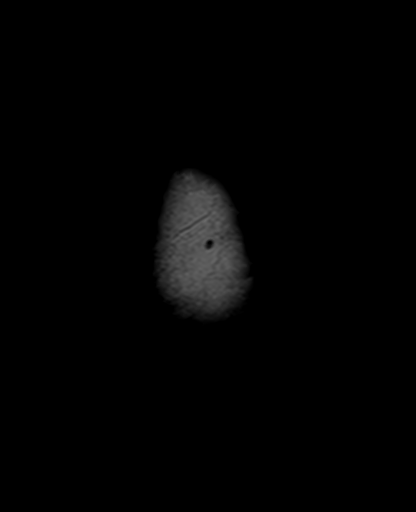

[Series 12: DWI · coronal · 5.0mm · 1.31mm/px · 5 of 56 slices shown (1 of 2)]
[im 1/56]
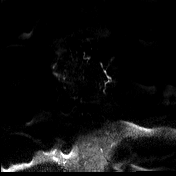
[im 14/56]
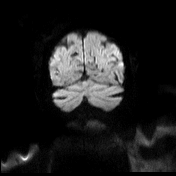
[im 28/56]
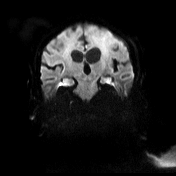
[im 42/56]
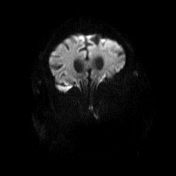
[im 56/56]
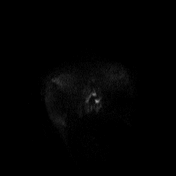

[Series 13: DWI · coronal · 5.0mm · 1.31mm/px · 2 of 28 slices shown (2 of 2)]
[im 1/28]
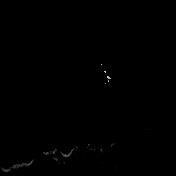
[im 28/28]
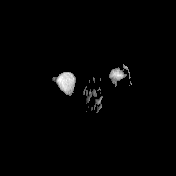

[Series 15: T2 post-contrast · coronal · 5.0mm · 0.86mm/px · 2 of 28 slices shown]
[im 1/28]
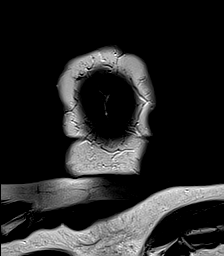
[im 28/28]
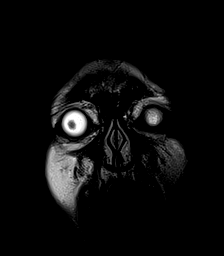

[Series 16: T1 post-contrast · axial · 3.0mm · 0.45mm/px · z∈[-3,+143]mm · 4 of 51 slices shown (1 of 3)]
[im 1/51]
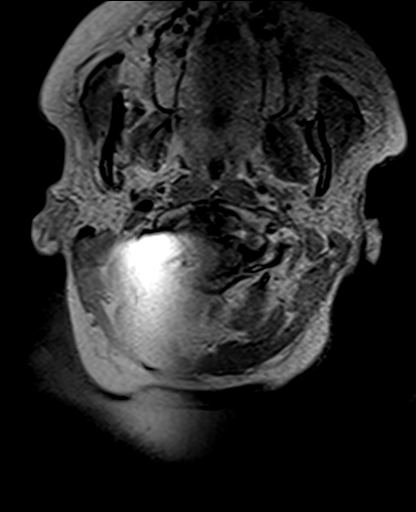
[im 17/51]
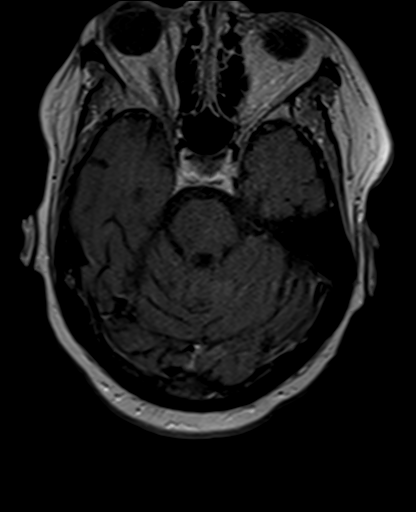
[im 34/51]
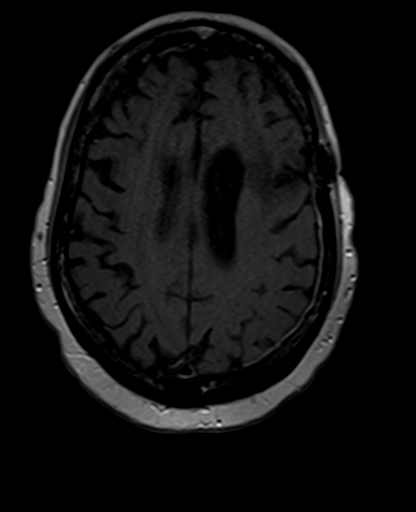
[im 51/51]
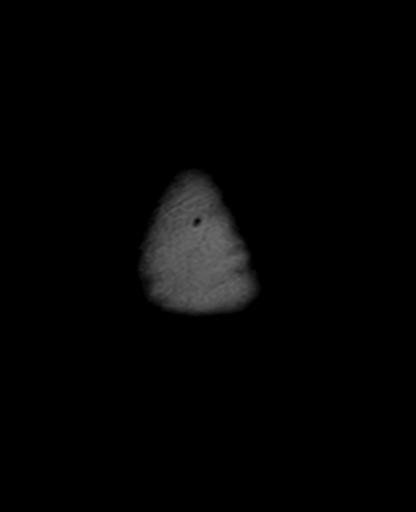

[Series 17: T1 post-contrast · coronal · 5.0mm · 0.43mm/px · 2 of 28 slices shown (2 of 3)]
[im 1/28]
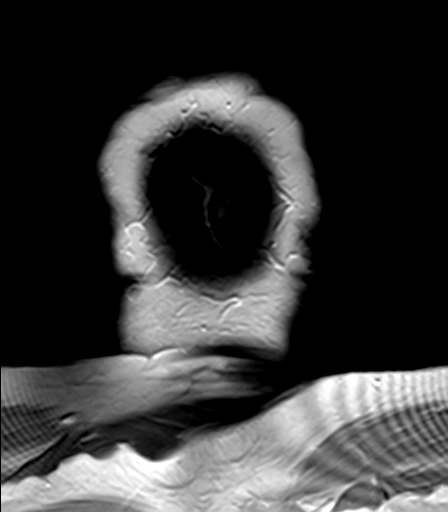
[im 28/28]
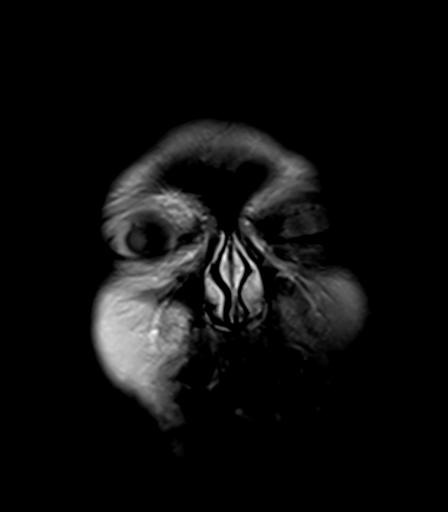

[Series 18: T1 post-contrast · sagittal · 5.0mm · 0.94mm/px · 2 of 24 slices shown (3 of 3)]
[im 1/24]
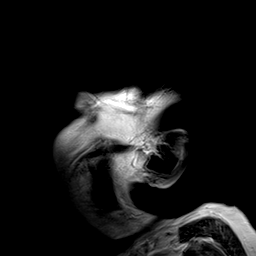
[im 24/24]
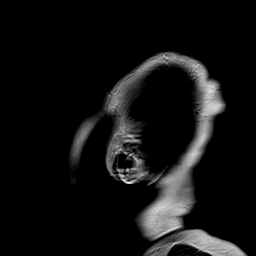

[35 of 48 positions shown; findings below may reference images not displayed]

FINDINGS: Brain: There is no acute infarction or intracranial hemorrhage.

Encephalomalacia in the left frontal lobe underlying craniotomy.
Additional patchy and confluent areas of T2 hyperintensity the
supratentorial white matter are nonspecific and may reflect chronic
microvascular and/or therapy related changes. No new abnormal
parenchymal enhancement. Thin left cerebral dural enhancement.
Scattered foci of chronic blood products are again noted. Prominence
of the ventricles and sulci reflects similar parenchymal volume
loss.

There is no intracranial mass, mass effect, or edema. There is no
hydrocephalus or extra-axial fluid collection.

Vascular: Major vessel flow voids at the skull base are preserved.

Skull and upper cervical spine: Normal marrow signal is preserved.
Left craniotomy.

Sinuses/Orbits: Minor mucosal thickening.  Left lens replacement.

Other: Sella is unremarkable.  Chronic bilateral mastoid effusions.
IMPRESSION: No evidence of intracranial metastatic disease other acute
abnormality. Stable chronic findings detailed above.

## 2022-01-29 MED ORDER — GABAPENTIN 300 MG PO CAPS
300.0000 mg | ORAL_CAPSULE | Freq: Two times a day (BID) | ORAL | Status: DC
Start: 2022-01-29 — End: 2022-02-02
  Administered 2022-01-29 – 2022-02-02 (×10): 300 mg via ORAL
  Filled 2022-01-29 (×10): qty 1

## 2022-01-29 MED ORDER — GABAPENTIN 300 MG PO CAPS
600.0000 mg | ORAL_CAPSULE | Freq: Every day | ORAL | Status: DC
Start: 2022-01-29 — End: 2022-02-02
  Administered 2022-01-29 – 2022-02-01 (×4): 600 mg via ORAL
  Filled 2022-01-29 (×4): qty 2

## 2022-01-29 MED ORDER — GADOBUTROL 1 MMOL/ML IV SOLN
10.0000 mL | Freq: Once | INTRAVENOUS | Status: AC | PRN
Start: 2022-01-29 — End: 2022-01-29
  Administered 2022-01-29: 10 mL via INTRAVENOUS

## 2022-01-29 MED ORDER — AMLODIPINE BESYLATE 5 MG PO TABS
5.0000 mg | ORAL_TABLET | Freq: Every day | ORAL | Status: DC
  Administered 2022-01-29 – 2022-02-02 (×5): 5 mg via ORAL
  Filled 2022-01-29 (×5): qty 1

## 2022-01-29 MED ORDER — PANTOPRAZOLE SODIUM 40 MG PO TBEC
40.0000 mg | DELAYED_RELEASE_TABLET | Freq: Every day | ORAL | Status: DC
  Administered 2022-01-29 – 2022-02-02 (×5): 40 mg via ORAL
  Filled 2022-01-29 (×5): qty 1

## 2022-01-29 MED ORDER — THIAMINE HCL 100 MG/ML IJ SOLN
500.0000 mg | Freq: Three times a day (TID) | INTRAVENOUS | Status: DC
  Administered 2022-01-29 – 2022-01-31 (×4): 500 mg via INTRAVENOUS
  Filled 2022-01-29 (×2): qty 5
  Filled 2022-01-29: qty 2
  Filled 2022-01-29 (×4): qty 5
  Filled 2022-01-29: qty 3

## 2022-01-29 MED ORDER — POTASSIUM CHLORIDE CRYS ER 20 MEQ PO TBCR
40.0000 meq | EXTENDED_RELEASE_TABLET | Freq: Once | ORAL | Status: AC
  Administered 2022-01-29: 40 meq via ORAL
  Filled 2022-01-29: qty 2

## 2022-01-29 MED ORDER — ALUM & MAG HYDROXIDE-SIMETH 200-200-20 MG/5ML PO SUSP: 15.0000 mL | Freq: Four times a day (QID) | ORAL | Status: DC | PRN

## 2022-01-29 MED ORDER — SODIUM CHLORIDE 0.9 % IV SOLN: INTRAVENOUS | Status: AC

## 2022-01-29 MED ORDER — HYDRALAZINE HCL 20 MG/ML IJ SOLN
10.0000 mg | Freq: Four times a day (QID) | INTRAMUSCULAR | Status: DC | PRN
  Administered 2022-01-29 – 2022-01-31 (×3): 10 mg via INTRAVENOUS
  Filled 2022-01-29 (×3): qty 1

## 2022-01-29 NOTE — Progress Notes (Addendum)
PROGRESS NOTE   Monica Mccoy  OJJ:[id number]    DOB: 18-Jun-1966    DOA: 01/28/2022  PCP: Haydee Salter, MD   I have briefly reviewed patients previous medical records in [place].    Brief Narrative:  56 year old female, at baseline alert, oriented to self only but able to follow directions and express needs with medical history significant for cancer of right breast (s/p bilateral mastectomies and adjuvant chemotherapy with Herceptin), with brain mets (s/p resection and radiation), on Herceptin maintenance, seizure disorder, chronic confusion, GERD, chronic pain, depression, hypertension, neuropathy, presented to the ED on 01/28/2022 for increased weakness and confusion and recent falls including hitting her head, over the past several days.  Also 2 to 3 days history of nausea, vomiting, dark stools and abdominal pain.  Presentation similar to episodes in January and April which resolved without clear source identified.   Assessment & Plan:  Principal Problem:   Acute encephalopathy Active Problems:   Gastroesophageal reflux disease without esophagitis   Other chronic pain   Moderate episode of recurrent major depressive disorder (HCC)   Cancer of right breast metastatic to brain [facility])   Essential hypertension   Chemotherapy-induced neuropathy (HCC)   Localization-related (focal) (partial) symptomatic epilepsy and epileptic syndromes with complex partial seizures, not intractable, without status epilepticus ([place])   Acute on chronic encephalopathy in patient with history of breast cancer with brain mets s/p resection and radiation, seizure disorder: No significant metabolic abnormality (ammonia normal, CMP unremarkable, brief lactic acidosis resolved) or infectious etiology to explain this.  CT head: Chronic postop changes from left frontal craniotomy with underlying encephalomalacia and gliosis within the left frontotemporal region, stable.  Radiation changes.  No acute  intracranial abnormalities.  Neurology was consulted, recommended MRI of brain with and without contrast (to evaluate for acute stroke, mets versus others) and EEG-ordered and pending.  Neurology will consult 6/5.  Valproate acid <10 (?  Issues with compliance with meds).  S/p IVF and IV Depakote and ED.  Continue oral Depakote,?  Uptitrate dose.  Chest x-ray, CT abdomen and pelvis without acute findings.  For now holding tramadol and duloxetine.  Continuing gabapentin.  Discussed with ED RN regarding stroke swallow screen and if passes, to start diet.  Reportedly somewhat better since arrival to ED but waxing and waning.  Addendum: MRI brain without acute findings.  Communicated with neurology and will see her this afternoon.  Seizure disorder: Management as noted above.  Seizure precautions.  Nausea, vomiting, dark stools and abdominal pain:  Hemoglobin normal.  FOBT negative.  CT abdomen without acute findings.  Urine microscopy without UTI.  Lipase normal.  Unclear etiology.?  Related to presenting problem of AMS.  Diet as tolerated and follow closely.  Avoid opioids.  Hypokalemia:  Replace and follow.  Lactic acidosis:  Lactate 2.8 on admission, resolved after IV fluids.  Breast cancer with brain mets: Follows with Dr. Lindi Adie.  Added him to care team so he is aware of her admission.  Currently on Herceptin.  Essential hypertension: Holding antihypertensives until stroke ruled out to allow for permissive hypertension  Depression: Holding home duloxetine until mental status improves  Chronic pain: Holding home tramadol.  Body mass index is 49.23 kg/m./Morbid obesity    DVT prophylaxis:   Lovenox   Code Status: Full Code:  Family Communication:  Disposition:  Status is: Observation For now appropriate for observation as she may discharge before 2 midnights but need to reassess in a.m.  Consultants:   Neurology-pending.  Procedures:     Antimicrobials:       Subjective:  Seen this morning while still in ED.  Awake, alert and oriented only to self.  Keeps repeating the same thing for most questions.  Does follow simple instructions.  As per RN, no acute issues noted  Objective:   Vitals:   01/29/22 0530 01/29/22 0600 01/29/22 0644 01/29/22 0730  BP: (!) 144/91 (!) 145/106 (!) 146/103 (!) 150/92  Pulse: 65 73 72 68  Resp:   20 18  Temp:      TempSrc:      SpO2: 99% 100% 99% 100%  Weight:      Height:        General exam: Young female, moderately built and morbidly obese lying comfortably supine in bed without distress. Respiratory system: Clear to auscultation. Respiratory effort normal. Cardiovascular system: S1 & S2 heard, RRR. No JVD, murmurs, rubs, gallops or clicks. No pedal edema. Gastrointestinal system: Abdomen is nondistended, soft and nontender. No organomegaly or masses felt. Normal bowel sounds heard. Central nervous system: Alert and oriented x1. No focal neurological deficits.  Follow simple instructions Extremities: Symmetric 5 x 5 power. Skin: No rashes, lesions or ulcers Psychiatry: Judgement and insight impaired. Mood & affect cannot assess, flat.    Data Reviewed:   I have personally reviewed following labs and imaging studies   CBC: Recent Labs  Lab 01/28/22 1758  WBC 5.1  HGB 12.8  HCT 41.3  MCV 94.1  PLT 470    Basic Metabolic Panel: Recent Labs  Lab 01/28/22 1758  NA 137  K 3.4*  CL 103  CO2 23  GLUCOSE 91  BUN 8  CREATININE 0.73  CALCIUM 9.3    Liver Function Tests: Recent Labs  Lab 01/28/22 1758  AST 19  ALT 10  ALKPHOS 66  BILITOT 1.0  PROT 8.4*  ALBUMIN 3.9    CBG: No results for input(s): GLUCAP in the last 168 hours.  Microbiology Studies:   Recent Results (from the past 240 hour(s))  SARS Coronavirus 2 by RT PCR (hospital order, performed in [facility] hospital lab) *cepheid single result test* Anterior Nasal Swab     Status: None   Collection Time: 01/28/22  10:54 PM   Specimen: Anterior Nasal Swab  Result Value Ref Range Status   SARS Coronavirus 2 by RT PCR NEGATIVE NEGATIVE Final    Comment: (NOTE) SARS-CoV-2 target nucleic acids are NOT DETECTED.  The SARS-CoV-2 RNA is generally detectable in upper and lower respiratory specimens during the acute phase of infection. The lowest concentration of SARS-CoV-2 viral copies this assay can detect is 250 copies / mL. A negative result does not preclude SARS-CoV-2 infection and should not be used as the sole basis for treatment or other patient management decisions.  A negative result may occur with improper specimen collection / handling, submission of specimen other than nasopharyngeal swab, presence of viral mutation(s) within the areas targeted by this assay, and inadequate number of viral copies (<250 copies / mL). A negative result must be combined with clinical observations, patient history, and epidemiological information.  Fact Sheet for Patients:   https://www.patel.info/  Fact Sheet for Healthcare Providers: https://hall.com/  This test is not yet approved or  cleared by the [place] FDA and has been authorized for detection and/or diagnosis of SARS-CoV-2 by FDA under an Emergency Use Authorization (EUA).  This EUA will remain in effect (meaning this test can be used) for  the duration of the COVID-19 declaration under Section 564(b)(1) of the Act, 21 U.S.C. section 360bbb-3(b)(1), unless the authorization is terminated or revoked sooner.  Performed at [facility] [place] [address] [place], [place] [address]     Radiology Studies:  DG Chest 2 View  Result Date: 01/28/2022 CLINICAL DATA:  Shortness of breath, weakness and confusion. EXAM: CHEST - 2 VIEW COMPARISON:  Chest radiograph 11/28/2021, CT 09/26/2021 FINDINGS: Low lung volumes. Left chest port remains in place. Upper normal heart size which is likely  accentuated by technique and low lung volumes. Increased atelectasis at the right lung base from prior exam. No confluent consolidation. No pneumothorax or large pleural effusion. Right axillary surgical clips. On limited assessment, no acute osseous abnormalities are seen. IMPRESSION: Low lung volumes with increased right basilar atelectasis. Electronically Signed   By: Keith Rake M.D.   On: 01/28/2022 18:22   CT Head Wo Contrast  Result Date: 01/28/2022 CLINICAL DATA:  Initial evaluation for altered mental status. EXAM: CT HEAD WITHOUT CONTRAST TECHNIQUE: Contiguous axial images were obtained from the base of the skull through the vertex without intravenous contrast. RADIATION DOSE REDUCTION: This exam was performed according to the departmental dose-optimization program which includes automated exposure control, adjustment of the mA and/or kV according to patient size and/or use of iterative reconstruction technique. COMPARISON:  Prior MRI from 09/26/2021. FINDINGS: Brain: Generalize cerebral atrophy, stable. Postoperative changes from prior left frontal craniotomy again noted. Underlying encephalomalacia and gliosis within the underlying left frontotemporal region, relatively stable. Scattered and confluent hypodensity elsewhere within the left greater than right supratentorial cerebral white matter, also stable, likely reflecting post treatment/post radiation changes. Calcification/mineralization at the pons noted, stable. No acute intracranial hemorrhage. No visible large vessel territory infarct. No visible new mass lesion or metastatic disease. Mass effect or midline shift. No hydrocephalus or extra-axial fluid collection. Vascular: No hyperdense vessel. Skull: Scalp soft tissues demonstrate no acute finding. Prior left frontal craniotomy. Sinuses/Orbits: Globes orbital soft tissues demonstrate no acute finding. Prior ocular lens replacement on the left. Paranasal sinuses are clear. Small right  greater than left mastoid effusions noted. Other: None. IMPRESSION: 1. No acute intracranial abnormality. 2. Postoperative changes from prior left frontal craniotomy with underlying encephalomalacia and gliosis within the underlying left frontotemporal region, stable. Confluent hypodensity elsewhere within the left greater than right cerebral hemispheres likely reflecting post radiation changes, stable. No new mass lesion or metastatic disease evident by CT. 3. Underlying atrophy, stable. Electronically Signed   By: Jeannine Boga M.D.   On: 01/28/2022 22:09   CT ABDOMEN PELVIS W CONTRAST  Result Date: 01/28/2022 CLINICAL DATA:  Acute abdominal pain. Electronic records indicates history of breast cancer. EXAM: CT ABDOMEN AND PELVIS WITH CONTRAST TECHNIQUE: Multidetector CT imaging of the abdomen and pelvis was performed using the standard protocol following bolus administration of intravenous contrast. RADIATION DOSE REDUCTION: This exam was performed according to the departmental dose-optimization program which includes automated exposure control, adjustment of the mA and/or kV according to patient size and/or use of iterative reconstruction technique. CONTRAST:  144m OMNIPAQUE IOHEXOL 300 MG/ML  SOLN COMPARISON:  09/26/2021 FINDINGS: Lower chest: Heart is upper normal in size. Tiny punctate subpleural nodules in the right lower lobe is unchanged from prior chest CT. Linear scarring in the dependent right lower lobe. No pleural effusion. Hepatobiliary: No focal liver abnormality is seen. Previous gallstone is not definitively seen on the current exam. No pericholecystic inflammation. No biliary dilatation. Pancreas: No ductal dilatation or  inflammation. Spleen: Normal in size without focal abnormality. Adrenals/Urinary Tract: Normal adrenal glands. No hydronephrosis or perinephric edema. Homogeneous renal enhancement with symmetric excretion on delayed phase imaging. No focal renal abnormality. Urinary  bladder is physiologically distended without wall thickening. Stomach/Bowel: Unremarkable stomach. No bowel obstruction or inflammation. Enteric sutures in the sigmoid. High-riding cecum in the right mid abdomen. Normal appendix. Vascular/Lymphatic: Normal caliber abdominal aorta. Patent portal vein. No adenopathy. Reproductive: Fibroid uterus. No evidence of extra uterine adnexal mass. Other: No ascites. No free air. Small fat containing umbilical hernia. There is no omental thickening. Musculoskeletal: Moderate left hip osteoarthritis. No evidence of focal bone lesion. IMPRESSION: 1. No acute abnormality or explanation for abdominal pain. 2. Fibroid uterus. Electronically Signed   By: Keith Rake M.D.   On: 01/28/2022 21:48    Scheduled Meds:    Chlorhexidine Gluconate Cloth  6 each Topical Daily   divalproex  250 mg Oral BID   enoxaparin (LOVENOX) injection  60 mg Subcutaneous Q24H   gabapentin  300 mg Oral BID BM   And   gabapentin  600 mg Oral QHS   potassium chloride  40 mEq Oral Once   sodium chloride flush  3 mL Intravenous Q12H    Continuous Infusions:    sodium chloride 125 mL/hr at 01/29/22 0131     LOS: 0 days     Vernell Leep, MD,  FACP, [facility], [facility], [facility] (Care Management Physician Certified) [place]  To contact the attending provider between 7A-7P or the covering provider during after hours 7P-7A, please log into the web site [email or web address] and access using universal [place] password for that web site. If you do not have the password, please call the hospital operator.  01/29/2022, 8:15 AM

## 2022-01-29 NOTE — Progress Notes (Signed)
EEG complete - results pending 

## 2022-01-29 NOTE — Consult Note (Signed)
Neurology Consultation Reason for Consult: AMS Referring Physician: Hongalgi, A  CC: Altered mental status  History is obtained from: Patient, sister  HPI: Monica Mccoy is a 56 y.o. female with a history of breast cancer with intracranial metastasis, partial seizure disorder, history of whole brain radiation who presents with altered mental status.  She has been having some subacute decline that was noted in a note back in April with memory problems, some trouble with unsteadiness.  Over the past 2 weeks, however, she has had a fairly abrupt worsening.  Family notes that she has been having more trouble walking, had significantly more trouble getting her words out.  She is also had some difficulties with nausea and vomiting.   She does state that her problems seem to be coming and going, sometimes worse than others.   Past Medical History:  Diagnosis Date   Anxiety    Breast cancer metastasized to brain [facility])    Left breast   Chronic pain after cancer treatment    Depression    GERD (gastroesophageal reflux disease)    History of cancer chemotherapy    Breast cancer left breast   Seizure ([place])      Family History  Problem Relation Age of Onset   Obesity Mother    Hypertension Mother    Hyperlipidemia Mother    Diabetes Mother    Heart disease Mother      Social History:  reports that she has never smoked. She has never used smokeless tobacco. She reports current alcohol use. She reports current drug use. Drug: Marijuana.   Exam: Current vital signs: BP (!) 187/84 (BP Location: Left Leg)   Pulse 61   Temp 97.6 F (36.4 C)   Resp 20   Ht '5\' 6"'$  (1.676 m)   Wt (!) 138.3 kg   SpO2 100%   BMI 49.23 kg/m  Vital signs in last 24 hours: Temp:  [97.2 F (36.2 C)-99.5 F (37.5 C)] 97.6 F (36.4 C) (06/05 1413) Pulse Rate:  [50-82] 61 (06/05 1413) Resp:  [16-24] 20 (06/05 1413) BP: (119-187)/(54-146) 187/84 (06/05 1556) SpO2:  [94 %-100 %] 100 % (06/05 1413) Weight:   [138.3 kg] 138.3 kg (06/04 1718)   Physical Exam  Constitutional: Appears well-developed and well-nourished.  Psych: Affect appropriate to situation Eyes: No scleral injection HENT: No OP obstruction MSK: no joint deformities.  Cardiovascular: Normal rate and regular rhythm.  Respiratory: Effort normal, non-labored breathing GI: Soft.  No distension. There is no tenderness.  Skin: WDI  Neuro: Mental Status: Patient is awake, she is unable to tell me the month, or confirm when I list possibilities.  She is unable to give any year.  She has severe difficulties with naming, but is able to repeat.(Transcortical motor aphasia) Cranial Nerves: II: Visual Fields are full. Pupils are equal, round, and reactive to light.   III,IV, VI: She appears to have a slight right gaze preference, but is able to cross midline to the left V: Facial sensation is symmetric to temperature VII: Facial movement is symmetric.  VIII: hearing is intact to voice Motor: Tone is normal. Bulk is normal. 5/5 strength was present in all four extremities.  Sensory: Sensation is diminished in the right arm compared to the left Cerebellar: No clear ataxia     I have reviewed labs in epic and the results pertinent to this consultation are: Lactate 2.8 -> 0.9 Creatinine 0.73 Calcium 9.3 Normal LFTs Normal ammonia VPA level less than 10 UA is  negative B12 292  I have reviewed the images obtained: MRI brain-no acute abnormality  Impression: 56 year old female with worsening of her underlying chronic neurological complaints.  Possibilities include "peeling the onion" in the setting of GI illness, seizure, or some other physiological stressor.  I do think that she needs an EEG, and if she continues to have symptoms without any other clear explanation, may need continuous EEG monitoring.  With encephalopathy in the setting of recent gastrointestinal symptoms, I do think I would favor   Recommendations: 1) EEG 2)  B1, MMA 3) thiamine '500mg'$  TID x 3 days 4) PT, OT, ST 5) May need to consider LTM EEG if not improvement.  6) Will follow.    Roland Rack, MD [facility] [phone]  If 7pm- 7am, please page neurology on call as listed in [name].

## 2022-01-30 ENCOUNTER — Inpatient Hospital Stay (HOSPITAL_COMMUNITY): Payer: Medicare Other

## 2022-01-30 DIAGNOSIS — C7931 Secondary malignant neoplasm of brain: Secondary | ICD-10-CM | POA: Diagnosis present

## 2022-01-30 DIAGNOSIS — Z171 Estrogen receptor negative status [ER-]: Secondary | ICD-10-CM | POA: Diagnosis not present

## 2022-01-30 DIAGNOSIS — G620 Drug-induced polyneuropathy: Secondary | ICD-10-CM | POA: Diagnosis present

## 2022-01-30 DIAGNOSIS — E876 Hypokalemia: Secondary | ICD-10-CM | POA: Diagnosis present

## 2022-01-30 DIAGNOSIS — Z833 Family history of diabetes mellitus: Secondary | ICD-10-CM | POA: Diagnosis not present

## 2022-01-30 DIAGNOSIS — Z20822 Contact with and (suspected) exposure to covid-19: Secondary | ICD-10-CM | POA: Diagnosis present

## 2022-01-30 DIAGNOSIS — Z9013 Acquired absence of bilateral breasts and nipples: Secondary | ICD-10-CM | POA: Diagnosis not present

## 2022-01-30 DIAGNOSIS — T451X5A Adverse effect of antineoplastic and immunosuppressive drugs, initial encounter: Secondary | ICD-10-CM | POA: Diagnosis present

## 2022-01-30 DIAGNOSIS — G9340 Encephalopathy, unspecified: Secondary | ICD-10-CM | POA: Diagnosis present

## 2022-01-30 DIAGNOSIS — Z79899 Other long term (current) drug therapy: Secondary | ICD-10-CM | POA: Diagnosis not present

## 2022-01-30 DIAGNOSIS — Z6841 Body Mass Index (BMI) 40.0 and over, adult: Secondary | ICD-10-CM | POA: Diagnosis not present

## 2022-01-30 DIAGNOSIS — R410 Disorientation, unspecified: Secondary | ICD-10-CM | POA: Diagnosis present

## 2022-01-30 DIAGNOSIS — G9341 Metabolic encephalopathy: Secondary | ICD-10-CM | POA: Diagnosis present

## 2022-01-30 DIAGNOSIS — Z83438 Family history of other disorder of lipoprotein metabolism and other lipidemia: Secondary | ICD-10-CM | POA: Diagnosis not present

## 2022-01-30 DIAGNOSIS — R4182 Altered mental status, unspecified: Secondary | ICD-10-CM | POA: Diagnosis not present

## 2022-01-30 DIAGNOSIS — Z8249 Family history of ischemic heart disease and other diseases of the circulatory system: Secondary | ICD-10-CM | POA: Diagnosis not present

## 2022-01-30 DIAGNOSIS — E872 Acidosis, unspecified: Secondary | ICD-10-CM | POA: Diagnosis present

## 2022-01-30 DIAGNOSIS — I10 Essential (primary) hypertension: Secondary | ICD-10-CM | POA: Diagnosis present

## 2022-01-30 DIAGNOSIS — C50911 Malignant neoplasm of unspecified site of right female breast: Secondary | ICD-10-CM | POA: Diagnosis not present

## 2022-01-30 DIAGNOSIS — G40209 Localization-related (focal) (partial) symptomatic epilepsy and epileptic syndromes with complex partial seizures, not intractable, without status epilepticus: Secondary | ICD-10-CM | POA: Diagnosis not present

## 2022-01-30 DIAGNOSIS — Z923 Personal history of irradiation: Secondary | ICD-10-CM | POA: Diagnosis not present

## 2022-01-30 DIAGNOSIS — Z853 Personal history of malignant neoplasm of breast: Secondary | ICD-10-CM | POA: Diagnosis not present

## 2022-01-30 DIAGNOSIS — F331 Major depressive disorder, recurrent, moderate: Secondary | ICD-10-CM | POA: Diagnosis present

## 2022-01-30 DIAGNOSIS — G894 Chronic pain syndrome: Secondary | ICD-10-CM | POA: Diagnosis present

## 2022-01-30 DIAGNOSIS — K219 Gastro-esophageal reflux disease without esophagitis: Secondary | ICD-10-CM | POA: Diagnosis present

## 2022-01-30 LAB — BASIC METABOLIC PANEL
Anion gap: 6 (ref 5–15)
BUN: 7 mg/dL (ref 6–20)
CO2: 27 mmol/L (ref 22–32)
Calcium: 8.9 mg/dL (ref 8.9–10.3)
Chloride: 107 mmol/L (ref 98–111)
Creatinine, Ser: 0.85 mg/dL (ref 0.44–1.00)
GFR, Estimated: >60 mL/min (ref >60)
Glucose, Bld: 103 mg/dL — ABNORMAL HIGH (ref 70–99)
Potassium: 2.9 mmol/L — ABNORMAL LOW (ref 3.5–5.1)
Sodium: 140 mmol/L (ref 135–145)

## 2022-01-30 LAB — CBC
HCT: 35.3 % — ABNORMAL LOW (ref 36.0–46.0)
Hemoglobin: 11.4 g/dL — ABNORMAL LOW (ref 12.0–15.0)
MCH: 29.6 pg (ref 26.0–34.0)
MCHC: 32.3 g/dL (ref 30.0–36.0)
MCV: 91.7 fL (ref 80.0–100.0)
Platelets: 174 10*3/uL (ref 150–400)
RBC: 3.85 MIL/uL — ABNORMAL LOW (ref 3.87–5.11)
RDW: 13.7 % (ref 11.5–15.5)
WBC: 3.5 10*3/uL — ABNORMAL LOW (ref 4.0–10.5)
nRBC: 0 % (ref 0.0–0.2)

## 2022-01-30 MED ORDER — ONDANSETRON HCL 4 MG/2ML IJ SOLN
4.0000 mg | Freq: Once | INTRAMUSCULAR | Status: AC
  Administered 2022-01-30: 4 mg via INTRAVENOUS
  Filled 2022-01-30: qty 2

## 2022-01-30 MED ORDER — POTASSIUM CHLORIDE 10 MEQ/100ML IV SOLN
10.0000 meq | INTRAVENOUS | Status: AC
  Administered 2022-01-30 (×3): 10 meq via INTRAVENOUS
  Filled 2022-01-30 (×3): qty 100

## 2022-01-30 NOTE — Progress Notes (Signed)
Patient transferred over to [facility]. Telephone report/handoff completed with RN Kasandra Knudsen who will take over as primary RN.

## 2022-01-30 NOTE — Progress Notes (Signed)
Subjective: Patient is markedly better, reports no further waxing/waning.   Exam: Vitals:   01/30/22 0524 01/30/22 0919  BP: 108/79 134/80  Pulse: 79 75  Resp:    Temp: 97.8 F (36.6 C)   SpO2: 100%    Gen: In bed, NAD Resp: non-labored breathing, no acute distress Abd: soft, nt  Neuro: MS: awake, alert, able to give month, but not year. She is able to name watch but not band, thumb, cell phone.  CN:VFF, EOMI, no gaze preference Motor: MAEW Sensory:decreased in the right leg.    Pertinent Labs: K 2.9  Impression: 56 year old female with worsening of her underlying chronic neurological complaints.  Possibilities include "peeling the onion" in the setting of GI illness, seizure, or some other physiological stressor.  I do think that she needs an EEG, and if she continues to have symptoms without any other clear explanation, may need continuous EEG monitoring.  With encephalopathy in the setting of recent gastrointestinal symptoms, I do think I would favor replacing B1 as well. Unfortunately, the B1 level ordered yesterday was not done until after her first dose of thiamine so is not useful. Given she is improving, I would favor continuing this.   Would consider transfer to [facility] to do continuous EEG, as I still think there is a possibility that recurrent partial seizures have played a role.  Excluding this would require continuous EEG, though she is no longer waxing/waning, it may not completely answer the question.  Recommendations: 1) Continue thiamine repletion.  2) consider transfer for continuous EEG 3) neurology will continue to follow.  Roland Rack, MD [facility] [phone]  If 7pm- 7am, please page neurology on call as listed in [place].

## 2022-01-30 NOTE — Progress Notes (Signed)
LTM EEG hooked up and running - no initial skin breakdown - push button tested - neuro notified. [facility] monitoring.  

## 2022-01-30 NOTE — Progress Notes (Signed)
[facility]  PROGRESS NOTE  Monica Mccoy BZJ:[id number] DOB: 28-Oct-1965 DOA: 01/28/2022 PCP: Haydee Salter, MD   Brief HPI:   56 year old female with history of cancer of right breast, s/p bilateral mastectomy and adjuvant chemotherapy with Herceptin, brain metastasis s/p resection and radiation, on Herceptin maintenance, seizure disorder, chronic confusion, GERD, chronic pain syndrome, depression, hypertension, neuropathy presented to ED on 01/28/2022 for increased weakness and confusion and recent falls including hitting her head over the past several days.  Also a 2 to 3-day history of nausea, vomiting, dark stools abdominal pain.    Subjective   Patient seen and examined, she is alert and oriented to self only, says that pain is much better controlled after she received pain medication last night.   Assessment/Plan:    Acute on chronic encephalopathy -Patient history of breast cancer with brain metastasis s/p resection and radiation -Also has history of seizure disorder -Ammonia normal, CT head showed chronic postoperative from left frontal craniotomy with underlying encephalomalacia and gliosis within the left frontotemporal region -Neurology was consulted, MRI brain obtained which showed no evidence of intracranial metastasis disease, other acute abnormality. -Continue oral Depakote, gabapentin -Also started on IV thiamine per neurology with significant improvement as per Dr. Leonel Ramsay -EEG obtained yesterday was negative for epileptiform discharges, showed cortical dysfunction arising from left frontal region consistent with prior craniotomy -Plan to transfer to [facility] for long-term EEG monitoring -Neurology following  Nausea vomiting, dark stools abdominal pain -Hemoglobin is normal -FOBT is negative -CT abdomen showed no acute finding -Lipase normal -Patient is currently tolerating diet well -Will monitor  Seizure disorder -Continue Depakote -Depakote level was  less than 10 on 01/28/2022  Hypokalemia -Potassium is 2.9 today -Replace potassium, check serum magnesium  Lactic acidosis -Lactic was 2.8 on admission -Resolved after IV fluids  Breast cancer with brain mets -Follows Dr. Lindi Adie as outpatient -Continue Herceptin  Hypertension -Blood pressure is stable -Continue as needed hydralazine -Continue amlodipine 5 mg daily  Depression -Duloxetine on hold due to altered mental status  Chronic pain syndrome -Tramadol on hold     Medications     amLODipine  5 mg Oral Daily   Chlorhexidine Gluconate Cloth  6 each Topical Daily   divalproex  250 mg Oral BID   enoxaparin (LOVENOX) injection  60 mg Subcutaneous Q24H   gabapentin  300 mg Oral BID BM   And   gabapentin  600 mg Oral QHS   pantoprazole  40 mg Oral Daily   sodium chloride flush  3 mL Intravenous Q12H     Data Reviewed:   CBG:  No results for input(s): GLUCAP in the last 168 hours.  SpO2: 100 %    Vitals:   01/29/22 2300 01/30/22 0214 01/30/22 0524 01/30/22 0919  BP: (!) 174/86 133/65 108/79 134/80  Pulse:  82 79 75  Resp:      Temp:  98.1 F (36.7 C) 97.8 F (36.6 C)   TempSrc:  Oral Oral   SpO2:  100% 100%   Weight:      Height:          Data Reviewed:  Basic Metabolic Panel: Recent Labs  Lab 01/28/22 1758 01/30/22 0332  NA 137 140  K 3.4* 2.9*  CL 103 107  CO2 23 27  GLUCOSE 91 103*  BUN 8 7  CREATININE 0.73 0.85  CALCIUM 9.3 8.9    CBC: Recent Labs  Lab 01/28/22 1758 01/30/22 0332  WBC 5.1 3.5*  HGB 12.8  11.4*  HCT 41.3 35.3*  MCV 94.1 91.7  PLT 188 174    LFT Recent Labs  Lab 01/28/22 1758  AST 19  ALT 10  ALKPHOS 66  BILITOT 1.0  PROT 8.4*  ALBUMIN 3.9     Antibiotics: Anti-infectives (From admission, onward)    None        DVT prophylaxis: Lovenox  Code Status: Full code  Family Communication: No family at bedside   CONSULTS neurology   Objective    Physical  Examination:   General-appears in no acute distress Heart-S1-S2, regular, no murmur auscultated Lungs-clear to auscultation bilaterally, no wheezing or crackles auscultated Abdomen-soft, nontender, no organomegaly Extremities-no edema in the lower extremities Neuro-alert, oriented to self only   Status is: Inpatient: Altered mental status           [place]   [facility] If 7PM-7AM, please contact night-coverage at [email or web address], Office  [phone]   01/30/2022, 12:49 PM  LOS: 0 days

## 2022-01-30 NOTE — Procedures (Signed)
Patient Name: Monica Mccoy  MRN: [id number]  Epilepsy Attending: Lora Havens  Referring Physician/Provider: Marcelyn Bruins, MD Date: 01/29/2022 Duration: 22.30 mins  Patient history: 56 y.o. female with a history of breast cancer with intracranial metastasis, partial seizure disorder, history of whole brain radiation who presents with altered mental status. EEG to evaluate for seizure.  Level of alertness: Awake, asleep  AEDs during EEG study: GBP  Technical aspects: This EEG study was done with scalp electrodes positioned according to the 10-20 International system of electrode placement. Electrical activity was acquired at a sampling rate of '500Hz'$  and reviewed with a high frequency filter of '70Hz'$  and a low frequency filter of '1Hz'$ . EEG data were recorded continuously and digitally stored.   Description:  The posterior dominant rhythm consists of 8 Hz activity of moderate voltage (25-35 uV) seen predominantly in posterior head regions, symmetric and reactive to eye opening and eye closing. Sleep was characterized by vertex waves, sleep spindles (12-'14Hz'$ ), maximal frontocentral region. EEG showed continuous polymorphic sharply contoured 3 to 6 Hz theta-delta slowing in left frontal region consistent with breach artifact.There is also intermittent generalized 3-'6hz'$  theta-delta slowing.     ABNORMALITY - Breach artifact, left frontal region - Intermittent slow, generalized   IMPRESSION: This study is suggestive of cortical dysfunction arising from left frontal region consistent with prior craniotomy. There is also mild diffuse encephalopathy, nonspecific etiology. No seizures or definite epileptiform discharges were seen throughout the recording.   Monica Mccoy Monica Mccoy

## 2022-01-31 DIAGNOSIS — G9340 Encephalopathy, unspecified: Secondary | ICD-10-CM | POA: Diagnosis not present

## 2022-01-31 DIAGNOSIS — C50911 Malignant neoplasm of unspecified site of right female breast: Secondary | ICD-10-CM | POA: Diagnosis not present

## 2022-01-31 LAB — URINE CULTURE: Culture: 80000 — AB

## 2022-01-31 LAB — BASIC METABOLIC PANEL
Anion gap: 5 (ref 5–15)
BUN: 10 mg/dL (ref 6–20)
CO2: 25 mmol/L (ref 22–32)
Calcium: 8.6 mg/dL — ABNORMAL LOW (ref 8.9–10.3)
Chloride: 109 mmol/L (ref 98–111)
Creatinine, Ser: 0.94 mg/dL (ref 0.44–1.00)
GFR, Estimated: >60 mL/min (ref >60)
Glucose, Bld: 108 mg/dL — ABNORMAL HIGH (ref 70–99)
Potassium: 3.3 mmol/L — ABNORMAL LOW (ref 3.5–5.1)
Sodium: 139 mmol/L (ref 135–145)

## 2022-01-31 LAB — MAGNESIUM: Magnesium: 1.8 mg/dL (ref 1.7–2.4)

## 2022-01-31 MED ORDER — MAGNESIUM OXIDE -MG SUPPLEMENT 400 (240 MG) MG PO TABS
400.0000 mg | ORAL_TABLET | Freq: Two times a day (BID) | ORAL | Status: DC
  Administered 2022-01-31 – 2022-02-02 (×5): 400 mg via ORAL
  Filled 2022-01-31 (×5): qty 1

## 2022-01-31 MED ORDER — THIAMINE HCL 100 MG/ML IJ SOLN
500.0000 mg | Freq: Three times a day (TID) | INTRAVENOUS | Status: AC
  Administered 2022-01-31 – 2022-02-01 (×5): 500 mg via INTRAVENOUS
  Filled 2022-01-31 (×5): qty 5

## 2022-01-31 MED ORDER — DICLOFENAC SODIUM 25 MG PO TBEC
50.0000 mg | DELAYED_RELEASE_TABLET | Freq: Two times a day (BID) | ORAL | Status: DC
  Administered 2022-01-31 – 2022-02-02 (×5): 50 mg via ORAL
  Filled 2022-01-31 (×6): qty 2

## 2022-01-31 MED ORDER — POTASSIUM CHLORIDE CRYS ER 20 MEQ PO TBCR
40.0000 meq | EXTENDED_RELEASE_TABLET | Freq: Two times a day (BID) | ORAL | Status: DC
  Administered 2022-01-31 – 2022-02-02 (×5): 40 meq via ORAL
  Filled 2022-01-31 (×5): qty 2

## 2022-01-31 NOTE — Progress Notes (Signed)
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 Delice Lesch and at last visit 06/07/2021 gabapentin was increased to 300 a.m. 300 2 PM and 600 at bedtime and continue Depakote Underlying seizures from radiation?  Meds?  On Depakote for headaches Depression Neuropathy HTN  Admitted 6/4 for increased weakness confusion?  Dark stool--he had several episodes apparently 08/2021 both times MRI with stable Work-up included potassium 3.4 lactic acid 2.8 given IV valproic acid and liter of fluids MRI brain 6/5 showed no evidence of intracranial metastases   Transferred from [facility] to [facility] for long-term EEG Patient given thiamine 500 3 times daily X 3 days obtaining B1 MMA  Hospital-Problem based course  Probable focal seizures in the setting of prior craniotomy Continue current Depakote 250 twice daily, gabapentin 300 twice daily with 600 at bedtime Avoid tramadol and use other agents for neuropathic pain EEG discontinued and would monitor and replace if continues to have issues Encephalopathy Replaced B1, replacing thiamine in addition Would replace magnesium as well is is 1.8 but replaced orally HTN Continue amlodipine 5 Holding Lasix 20 Depression Continue Cymbalta 60 twice daily  DVT prophylaxis: Lovenox Code Status: Full Family Communication: Sister at bedside [place] Disposition:  Status is: Inpatient Remains inpatient appropriate because:     Consultants:  Neurology  Procedures:   Antimicrobials:     Subjective: Awake  coherent seems to have pauses in speech sometimes misses words-sister says she is however much better than yesterday although still not very back to baseline  Objective: Vitals:   01/30/22 2006 01/30/22 2327 01/31/22 0317 01/31/22 0813  BP: (!) 173/116 (!) 151/97 140/88 (!) 153/89  Pulse: 81 73 88 65  Resp: '17 16 17 18  '$ Temp: 97.7 F (36.5 C) 98.3 F (36.8 C) 97.9 F (36.6 C) 98.2 F (36.8 C)  TempSrc: Oral Oral Oral   SpO2: 100% 100% 100% 100%  Weight:      Height:        Intake/Output Summary (Last 24 hours) at 01/31/2022 1433 Last data filed at 01/30/2022 2311 Gross per 24 hour  Intake 351.26 ml  Output --  Net 351.26 ml   Filed Weights   01/28/22 1718  Weight: (!) 138.3 kg    Examination:  Neck Mallampati 4 No icterus no pallor S1-S2 no murmur Chest clear no added sound no wheeze rales rhonchi Moving 4 limbs equally Sometimes struggles to understand commands-for example I asked her to raise knees off the floor or drags off the bed she does not seem to sensory is intact   Data Reviewed: personally reviewed   CBC    Component Value Date/Time   WBC 3.5 (L) 01/30/2022 0332   RBC 3.85 (L) 01/30/2022 0332   HGB 11.4 (L) 01/30/2022 0332   HGB 12.5 11/30/2021 1108   HCT 35.3 (L) 01/30/2022 0332   PLT 174 01/30/2022 0332   PLT 168 11/30/2021 1108   MCV 91.7 01/30/2022 0332   MCH 29.6 01/30/2022 0332   MCHC 32.3 01/30/2022 0332   RDW 13.7 01/30/2022 0332   LYMPHSABS 1.0 11/30/2021 1108  MONOABS 0.6 11/30/2021 1108   EOSABS 0.1 11/30/2021 1108   BASOSABS 0.0 11/30/2021 1108      Latest Ref Rng & Units 01/31/2022    3:45 AM 02-27-22    3:32 AM 01/28/2022    5:58 PM  CMP  Glucose 70 - 99 mg/dL 108   103   91    BUN 6 - 20 mg/dL '10   7   8    '$ Creatinine 0.44 - 1.00 mg/dL 0.94   0.85   0.73    Sodium 135 - 145 mmol/L 139   140   137    Potassium 3.5 - 5.1 mmol/L 3.3   2.9   3.4    Chloride 98 - 111 mmol/L 109   107   103    CO2 22 - 32 mmol/L '25   27   23     '$ Calcium 8.9 - 10.3 mg/dL 8.6   8.9   9.3    Total Protein 6.5 - 8.1 g/dL   8.4    Total Bilirubin 0.3 - 1.2 mg/dL   1.0    Alkaline Phos 38 - 126 U/L   66    AST 15 - 41 U/L   19    ALT 0 - 44 U/L   10       Radiology Studies: EEG adult  Result Date: 2022/02/27 Lora Havens, MD     27-Feb-2022  8:30 AM Patient Name: Beverlee Wilmarth MRN: [id number] Epilepsy Attending: Lora Havens Referring Physician/Provider: Marcelyn Bruins, MD Date: 01/29/2022 Duration: 22.30 mins Patient history: 56 y.o. female with a history of breast cancer with intracranial metastasis, partial seizure disorder, history of whole brain radiation who presents with altered mental status. EEG to evaluate for seizure. Level of alertness: Awake, asleep AEDs during EEG study: GBP Technical aspects: This EEG study was done with scalp electrodes positioned according to the 10-20 International system of electrode placement. Electrical activity was acquired at a sampling rate of '500Hz'$  and reviewed with a high frequency filter of '70Hz'$  and a low frequency filter of '1Hz'$ . EEG data were recorded continuously and digitally stored. Description:  The posterior dominant rhythm consists of 8 Hz activity of moderate voltage (25-35 uV) seen predominantly in posterior head regions, symmetric and reactive to eye opening and eye closing. Sleep was characterized by vertex waves, sleep spindles (12-'14Hz'$ ), maximal frontocentral region. EEG showed continuous polymorphic sharply contoured 3 to 6 Hz theta-delta slowing in left frontal region consistent with breach artifact.There is also intermittent generalized 3-'6hz'$  theta-delta slowing.   ABNORMALITY - Breach artifact, left frontal region - Intermittent slow, generalized  IMPRESSION: This study is suggestive of cortical dysfunction arising from left frontal region consistent with prior craniotomy. There is also mild diffuse encephalopathy, nonspecific etiology. No seizures or definite epileptiform  discharges were seen throughout the recording. Priyanka Barbra Sarks   Overnight EEG with video  Result Date: 01/31/2022 Lora Havens, MD     01/31/2022  2:03 PM Patient Name: Deliana Avalos MRN: [id number] Epilepsy Attending: Lora Havens Referring Physician/Provider: Greta Doom, MD Duration: 02-27-2022 2225 to 01/31/2022 1000  Patient history: 56 y.o. female with a history of breast cancer with intracranial metastasis, partial seizure disorder, history of whole brain radiation who presents with altered mental status. EEG to evaluate for seizure.  Level of alertness: Awake, asleep  AEDs during EEG study: GBP  Technical aspects: This EEG study was done with scalp electrodes positioned according to the 10-20 International  system of electrode placement. Electrical activity was acquired at a sampling rate of '500Hz'$  and reviewed with a high frequency filter of '70Hz'$  and a low frequency filter of '1Hz'$ . EEG data were recorded continuously and digitally stored.  Description:  The posterior dominant rhythm consists of 8 Hz activity of moderate voltage (25-35 uV) seen predominantly in posterior head regions, symmetric and reactive to eye opening and eye closing. Sleep was characterized by vertex waves, sleep spindles (12-'14Hz'$ ), maximal frontocentral region. EEG showed continuous polymorphic sharply contoured 3 to 6 Hz theta-delta slowing in left frontal region consistent with breach artifact.There is also intermittent generalized 3-'6hz'$  theta-delta slowing.   ABNORMALITY - Breach artifact, left frontal region - Intermittent slow, generalized  IMPRESSION: This study is suggestive of cortical dysfunction arising from left frontal region consistent with prior craniotomy. There is also mild diffuse encephalopathy, nonspecific etiology. No seizures or definite epileptiform discharges were seen throughout the recording.   Priyanka Barbra Sarks     Scheduled Meds:  amLODipine  5 mg Oral Daily   Chlorhexidine Gluconate Cloth   6 each Topical Daily   divalproex  250 mg Oral BID   enoxaparin (LOVENOX) injection  60 mg Subcutaneous Q24H   gabapentin  300 mg Oral BID BM   And   gabapentin  600 mg Oral QHS   magnesium oxide  400 mg Oral BID   pantoprazole  40 mg Oral Daily   potassium chloride  40 mEq Oral BID   sodium chloride flush  3 mL Intravenous Q12H   Continuous Infusions:  thiamine injection 500 mg (01/31/22 1035)     LOS: 1 day   Time spent: [place], MD [facility] To contact the attending provider between 7A-7P or the covering provider during after hours 7P-7A, please log into the web site [email or web address] and access using universal [place] password for that web site. If you do not have the password, please call the hospital operator.  01/31/2022, 2:33 PM

## 2022-01-31 NOTE — Progress Notes (Signed)
vLTM discontinue  [facility] notified   No skin breakdown noted

## 2022-01-31 NOTE — Procedures (Addendum)
Patient Name: Monica Mccoy  MRN: [id number]  Epilepsy Attending: Lora Havens  Referring Physician/Provider: Greta Doom, MD Duration: 01/30/2022 2225 to 01/31/2022 1000   Patient history: 56 y.o. female with a history of breast cancer with intracranial metastasis, partial seizure disorder, history of whole brain radiation who presents with altered mental status. EEG to evaluate for seizure.   Level of alertness: Awake, asleep   AEDs during EEG study: GBP   Technical aspects: This EEG study was done with scalp electrodes positioned according to the 10-20 International system of electrode placement. Electrical activity was acquired at a sampling rate of '500Hz'$  and reviewed with a high frequency filter of '70Hz'$  and a low frequency filter of '1Hz'$ . EEG data were recorded continuously and digitally stored.    Description:  The posterior dominant rhythm consists of 8 Hz activity of moderate voltage (25-35 uV) seen predominantly in posterior head regions, symmetric and reactive to eye opening and eye closing. Sleep was characterized by vertex waves, sleep spindles (12-'14Hz'$ ), maximal frontocentral region. EEG showed continuous polymorphic sharply contoured 3 to 6 Hz theta-delta slowing in left frontal region consistent with breach artifact.There is also intermittent generalized 3-'6hz'$  theta-delta slowing.     ABNORMALITY - Breach artifact, left frontal region - Intermittent slow, generalized   IMPRESSION: This study is suggestive of cortical dysfunction arising from left frontal region consistent with prior craniotomy. There is also mild diffuse encephalopathy, nonspecific etiology. No seizures or definite epileptiform discharges were seen throughout the recording.     Tarris Delbene Barbra Sarks

## 2022-01-31 NOTE — Progress Notes (Signed)
Subjective: Patient continues to improve. I discussed with her eating, and she states that she has been losing weight unintentionally, just doesn't like eating lately.   Exam: Vitals:   01/31/22 0317 01/31/22 0813  BP: 140/88 (!) 153/89  Pulse: 88 65  Resp: 17 18  Temp: 97.9 F (36.6 C) 98.2 F (36.8 C)  SpO2: 100% 100%   Gen: In bed, NAD Resp: non-labored breathing, no acute distress Abd: soft, nt  Neuro: MS: awake, alert, able to give month, but not year. She is able to name watch but not band, thumb, cell phone.  CN:VFF, EOMI, no gaze preference Motor: MAEW Sensory:decreased in the right leg.    Pertinent Labs: Cr 0.94  Impression: 56 year old female with worsening of her underlying chronic neurological complaints.  Possibilities include "peeling the onion" in the setting of GI illness, seizure, or some other physiological stressor.  I do think that she needs an EEG, and if she continues to have symptoms without any other clear explanation, may need continuous EEG monitoring.    With encephalopathy and difficulty walking in the setting of recent gastrointestinal symptoms and weight loss, I opted for replacing  B1 as well. Unfortunately, the B1 level ordered was not done until after her first dose of thiamine so is not useful. Given she is improving, I would favor continuing this for a total of 9 doses.   With 12 hours of EEG without seizure, I think further recording will only be useful if she begins to wax/wane again.  Recommendations: 1) Continue thiamine repletion for total 9 doses, then '100mg'$  daily.  2) can discontinue EEG, would re-connect for any worsening.  3) PT, OT 4) will follow.   Roland Rack, MD [facility] [phone]  If 7pm- 7am, please page neurology on call as listed in [place].

## 2022-01-31 NOTE — Evaluation (Signed)
Physical Therapy Evaluation Patient Details Name: Monica Mccoy MRN: [id number] DOB: 1966/07/13 Today's Date: 01/31/2022  History of Present Illness  56 y/o female presented to ED on 01/28/22 for increased weakness and confusion x 3 days. EEG negative. Admitted for workup for encephalopathy due to possible gastrointestinal symptoms. PMH: breast cancer with intracranial metastasis, chronic pain, depression, migraines, HTN, neuropathy, seizures.  Clinical Impression  Patient admitted with the above. Difficult to obtain accurate home information and PLOF as patient is oriented to self. Patient presents with generalized weakness, decreased activity tolerance, impaired balance, and impaired cognition. Patient requires min guard for transfers and room ambulation with no AD but demos very guarded gait pattern. She reports hx of falls PTA. Patient will benefit from skilled PT services during acute stay to address listed deficits. Recommend HHPT at discharge to maximize functional independence and safety in the home. Patient will need constant supervision for safety as she is at increased risk for falls due to weakness and cognitive deficits.        Recommendations for follow up therapy are one component of a multi-disciplinary discharge planning process, led by the attending physician.  Recommendations may be updated based on patient status, additional functional criteria and insurance authorization.  Follow Up Recommendations Home health PT    Assistance Recommended at Discharge Frequent or constant Supervision/Assistance  Patient can return home with the following  A little help with walking and/or transfers;A little help with bathing/dressing/bathroom;Assistance with cooking/housework;Direct supervision/assist for medications management;Direct supervision/assist for financial management;Assist for transportation;Help with stairs or ramp for entrance    Equipment Recommendations Rolling Caro Brundidge (2 wheels)   Recommendations for Other Services  OT consult    Functional Status Assessment Patient has had a recent decline in their functional status and demonstrates the ability to make significant improvements in function in a reasonable and predictable amount of time.     Precautions / Restrictions Precautions Precautions: Fall Restrictions Weight Bearing Restrictions: No      Mobility  Bed Mobility               General bed mobility comments: in chair on arrival    Transfers Overall transfer level: Needs assistance Equipment used: None Transfers: Sit to/from Stand Sit to Stand: Min guard           General transfer comment: min guard from recliner and low commode.    Ambulation/Gait Ambulation/Gait assistance: Min guard Gait Distance (Feet): 40 Feet Assistive device: None Gait Pattern/deviations: Step-through pattern, Decreased stride length Gait velocity: decreased Gait velocity interpretation: <1.8 ft/sec, indicate of risk for recurrent falls   General Gait Details: very guarded gait. Min guard for safety due to unsteadiness. Patient declining hallway ambulation due to incontinence and not having brief or pad readily available  Stairs            Wheelchair Mobility    Modified Rankin (Stroke Patients Only)       Balance Overall balance assessment: Needs assistance Sitting-balance support: No upper extremity supported, Feet supported Sitting balance-Leahy Scale: Good     Standing balance support: No upper extremity supported, During functional activity Standing balance-Leahy Scale: Fair                               Pertinent Vitals/Pain Pain Assessment Pain Assessment: No/denies pain    Home Living Family/patient expects to be discharged to:: Private residence Living Arrangements: Other relatives;Children;Parent Available Help at Discharge: Family;Available 24 hours/day  Type of Home: House Home Access: Stairs to enter Entrance  Stairs-Rails: Right   Alternate Level Stairs-Number of Steps: 1 flight Home Layout: Two level;Bed/bath upstairs (sleeps on couch on main level) Home Equipment: Rolling Slater Mcmanaman (2 wheels) Additional Comments: most information obtained from chart review. Patient oriented to self    Prior Function Prior Level of Function : Independent/Modified Independent;History of Falls (last six months)             Mobility Comments: patient reporting frequent falls and uses RW for community mobility       Hand Dominance   Dominant Hand: Right    Extremity/Trunk Assessment   Upper Extremity Assessment Upper Extremity Assessment: Generalized weakness    Lower Extremity Assessment Lower Extremity Assessment: Generalized weakness    Cervical / Trunk Assessment Cervical / Trunk Assessment: Other exceptions Cervical / Trunk Exceptions: increased body habitus  Communication   Communication: Expressive difficulties (word finding difficulties)  Cognition Arousal/Alertness: Awake/alert Behavior During Therapy: WFL for tasks assessed/performed Overall Cognitive Status: No family/caregiver present to determine baseline cognitive functioning Area of Impairment: Orientation, Attention, Memory, Following commands, Awareness, Problem solving                 Orientation Level: Disoriented to, Time, Place, Situation Current Attention Level: Sustained Memory: Decreased short-term memory Following Commands: Follows one step commands with increased time   Awareness: Intellectual Problem Solving: Slow processing, Requires verbal cues, Requires tactile cues General Comments: Oriented to self. With cueing and choices, patient able to state "hospital" but unable to identify correct hospital        General Comments      Exercises     Assessment/Plan    PT Assessment Patient needs continued PT services  PT Problem List Decreased strength;Decreased activity tolerance;Decreased  balance;Decreased mobility;Decreased cognition;Decreased knowledge of use of DME;Decreased safety awareness;Decreased knowledge of precautions       PT Treatment Interventions DME instruction;Gait training;Stair training;Functional mobility training;Therapeutic activities;Therapeutic exercise;Balance training;Patient/family education    PT Goals (Current goals can be found in the Care Plan section)  Acute Rehab PT Goals Patient Stated Goal: did not state PT Goal Formulation: Patient unable to participate in goal setting Time For Goal Achievement: 02/14/22 Potential to Achieve Goals: Fair    Frequency Min 3X/week     Co-evaluation               AM-PAC PT "6 Clicks" Mobility  Outcome Measure Help needed turning from your back to your side while in a flat bed without using bedrails?: A Little Help needed moving from lying on your back to sitting on the side of a flat bed without using bedrails?: A Little Help needed moving to and from a bed to a chair (including a wheelchair)?: A Little Help needed standing up from a chair using your arms (e.g., wheelchair or bedside chair)?: A Little Help needed to walk in hospital room?: A Little Help needed climbing 3-5 steps with a railing? : A Lot 6 Click Score: 17    End of Session Equipment Utilized During Treatment: Gait belt Activity Tolerance: Patient tolerated treatment well Patient left: in chair;with call bell/phone within reach Nurse Communication: Mobility status PT Visit Diagnosis: Unsteadiness on feet (R26.81);Muscle weakness (generalized) (M62.81);History of falling (Z91.81);Other abnormalities of gait and mobility (R26.89)    Time: 6629-4765 PT Time Calculation (min) (ACUTE ONLY): 27 min   Charges:   PT Evaluation $PT Eval Moderate Complexity: 1 Mod PT Treatments $Therapeutic Activity: 8-22 mins  Meia Emley A. Gilford Rile PT, DPT Acute Rehabilitation Services Office [phone]   Linna Hoff 01/31/2022,  5:11 PM

## 2022-02-01 DIAGNOSIS — G9340 Encephalopathy, unspecified: Secondary | ICD-10-CM | POA: Diagnosis not present

## 2022-02-01 DIAGNOSIS — C50911 Malignant neoplasm of unspecified site of right female breast: Secondary | ICD-10-CM | POA: Diagnosis not present

## 2022-02-01 LAB — COMPREHENSIVE METABOLIC PANEL
ALT: 9 U/L (ref 0–44)
AST: 14 U/L — ABNORMAL LOW (ref 15–41)
Albumin: 2.8 g/dL — ABNORMAL LOW (ref 3.5–5.0)
Alkaline Phosphatase: 55 U/L (ref 38–126)
Anion gap: 3 — ABNORMAL LOW (ref 5–15)
BUN: 9 mg/dL (ref 6–20)
CO2: 27 mmol/L (ref 22–32)
Calcium: 8.6 mg/dL — ABNORMAL LOW (ref 8.9–10.3)
Chloride: 108 mmol/L (ref 98–111)
Creatinine, Ser: 0.85 mg/dL (ref 0.44–1.00)
GFR, Estimated: >60 mL/min (ref >60)
Glucose, Bld: 99 mg/dL (ref 70–99)
Potassium: 4 mmol/L (ref 3.5–5.1)
Sodium: 138 mmol/L (ref 135–145)
Total Bilirubin: 0.9 mg/dL (ref 0.3–1.2)
Total Protein: 6.1 g/dL — ABNORMAL LOW (ref 6.5–8.1)

## 2022-02-01 LAB — MAGNESIUM: Magnesium: 1.9 mg/dL (ref 1.7–2.4)

## 2022-02-01 MED ORDER — THIAMINE HCL 100 MG PO TABS
100.0000 mg | ORAL_TABLET | Freq: Every day | ORAL | Status: DC
Start: 2022-02-02 — End: 2022-02-02
  Administered 2022-02-02: 100 mg via ORAL
  Filled 2022-02-01: qty 1

## 2022-02-01 NOTE — Progress Notes (Signed)
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 Delice Lesch and at last visit 06/07/2021 gabapentin was increased to 300 a.m. 300 2 PM and 600 at bedtime and continue Depakote Underlying seizures from radiation?  Meds?  On Depakote for headaches Depression Neuropathy HTN  Admitted 6/4 for increased weakness confusion?  Dark stool--he had several episodes apparently 08/2021 both times MRI with stable Work-up included potassium 3.4 lactic acid 2.8 given IV valproic acid and liter of fluids MRI brain 6/5 showed no evidence of intracranial metastases   Transferred from [place] to [facility] for long-term EEG Patient given thiamine 500 3 times daily X 3 days obtaining B1 MMA  Hospital-Problem based course  Probable focal?  Partial seizures in the setting of prior craniotomy for Metastatic Breast CA Continue current Depakote 250 twice daily, gabapentin 300 twice daily with 600 at bedtime Avoid tramadol and use other agents for neuropathic pain EEG discontinued and would monitor and replace if continues to have issues Encephalopathy Replaced B1, replacing thiamine in addition-would need a total of 9 doses and then can go to p.o. subsequently replacing magnesium orally HTN Continue amlodipine 5 Holding Lasix 20 Depression Continue Cymbalta 60 twice daily  DVT prophylaxis: Lovenox Code Status: Full Family Communication: Sister at bedside [name] was discussed with in detail on 6/7 Disposition:  Status is: Inpatient Remains inpatient appropriate  because:     Consultants:  Neurology  Procedures:   Antimicrobials:     Subjective:  Intermittently confused-cannot tell me the year-confabulates a month with the year on direct questioning Appetite is good  Objective: Vitals:   01/31/22 1912 01/31/22 2354 02/01/22 0900 02/01/22 0918  BP: 111/74 (!) 155/73 (!) 147/70 (!) 147/70  Pulse: 92 79 87 87  Resp:  '17 18 18  '$ Temp: 98.6 F (37 C) 98.5 F (36.9 C) 97.8 F (36.6 C) 97.8 F (36.6 C)  TempSrc: Oral Oral Oral Oral  SpO2: 97%  100% 100%  Weight:      Height:        Intake/Output Summary (Last 24 hours) at 02/01/2022 1345 Last data filed at 02/01/2022 0400 Gross per 24 hour  Intake 50 ml  Output 700 ml  Net -650 ml    Filed Weights   01/28/22 1718  Weight: (!) 138.3 kg    Examination:  Thick neck Mallampati 4 No icterus no pallor no LAN S1-S2 no murmur Chest clear no added sound no wheeze rales rhonchi Moving 4 limbs equally Confabulating responses at times but overall seems to be improved to the same compared to prior   Data Reviewed: personally reviewed   CBC    Component Value Date/Time   WBC 3.5 (L) 01/30/2022 0332   RBC 3.85 (L) 01/30/2022 0332   HGB 11.4 (L) 01/30/2022 0332   HGB 12.5 11/30/2021 1108   HCT 35.3 (L) 01/30/2022 0332   PLT 174 01/30/2022 0332   PLT 168 11/30/2021 1108   MCV 91.7 01/30/2022 0332   MCH 29.6 01/30/2022 0332   MCHC 32.3 01/30/2022 0332   RDW 13.7 01/30/2022 0332   LYMPHSABS 1.0 11/30/2021  1108   MONOABS 0.6 11/30/2021 1108   EOSABS 0.1 11/30/2021 1108   BASOSABS 0.0 11/30/2021 1108      Latest Ref Rng & Units 02/01/2022    5:02 AM 01/31/2022    3:45 AM 01/30/2022    3:32 AM  CMP  Glucose 70 - 99 mg/dL 99  108  103   BUN 6 - 20 mg/dL '9  10  7   '$ Creatinine 0.44 - 1.00 mg/dL 0.85  0.94  0.85   Sodium 135 - 145 mmol/L 138  139  140   Potassium 3.5 - 5.1 mmol/L 4.0  3.3  2.9   Chloride 98 - 111 mmol/L 108  109  107   CO2 22 - 32 mmol/L '27  25  27   '$ Calcium 8.9  - 10.3 mg/dL 8.6  8.6  8.9   Total Protein 6.5 - 8.1 g/dL 6.1     Total Bilirubin 0.3 - 1.2 mg/dL 0.9     Alkaline Phos 38 - 126 U/L 55     AST 15 - 41 U/L 14     ALT 0 - 44 U/L 9        Radiology Studies: Overnight EEG with video  Result Date: 01/31/2022 Lora Havens, MD     01/31/2022  2:03 PM Patient Name: Monica Mccoy MRN: [id number] Epilepsy Attending: Lora Havens Referring Physician/Provider: Greta Doom, MD Duration: 01/30/2022 2225 to 01/31/2022 1000  Patient history: 56 y.o. female with a history of breast cancer with intracranial metastasis, partial seizure disorder, history of whole brain radiation who presents with altered mental status. EEG to evaluate for seizure.  Level of alertness: Awake, asleep  AEDs during EEG study: GBP  Technical aspects: This EEG study was done with scalp electrodes positioned according to the 10-20 International system of electrode placement. Electrical activity was acquired at a sampling rate of '500Hz'$  and reviewed with a high frequency filter of '70Hz'$  and a low frequency filter of '1Hz'$ . EEG data were recorded continuously and digitally stored.  Description:  The posterior dominant rhythm consists of 8 Hz activity of moderate voltage (25-35 uV) seen predominantly in posterior head regions, symmetric and reactive to eye opening and eye closing. Sleep was characterized by vertex waves, sleep spindles (12-'14Hz'$ ), maximal frontocentral region. EEG showed continuous polymorphic sharply contoured 3 to 6 Hz theta-delta slowing in left frontal region consistent with breach artifact.There is also intermittent generalized 3-'6hz'$  theta-delta slowing.   ABNORMALITY - Breach artifact, left frontal region - Intermittent slow, generalized  IMPRESSION: This study is suggestive of cortical dysfunction arising from left frontal region consistent with prior craniotomy. There is also mild diffuse encephalopathy, nonspecific etiology. No seizures or definite epileptiform  discharges were seen throughout the recording.   Priyanka Barbra Sarks     Scheduled Meds:  amLODipine  5 mg Oral Daily   Chlorhexidine Gluconate Cloth  6 each Topical Daily   diclofenac  50 mg Oral BID WC   divalproex  250 mg Oral BID   enoxaparin (LOVENOX) injection  60 mg Subcutaneous Q24H   gabapentin  300 mg Oral BID BM   And   gabapentin  600 mg Oral QHS   magnesium oxide  400 mg Oral BID   pantoprazole  40 mg Oral Daily   potassium chloride  40 mEq Oral BID   sodium chloride flush  3 mL Intravenous Q12H   [START ON 02/02/2022] thiamine  100 mg Oral Daily   Continuous Infusions:  thiamine injection  500 mg (02/01/22 0951)     LOS: 2 days   Time spent: Hope, MD [facility] To contact the attending provider between 7A-7P or the covering provider during after hours 7P-7A, please log into the web site [email or web address] and access using universal [place] password for that web site. If you do not have the password, please call the hospital operator.  02/01/2022, 1:45 PM

## 2022-02-01 NOTE — Progress Notes (Signed)
Physical Therapy Treatment Patient Details Name: Monica Mccoy MRN: [id number] DOB: [birth year] Today's Date: 02/01/2022   History of Present Illness 56 y/o female presented to ED on 01/28/22 for increased weakness and confusion x 3 days. EEG negative. Admitted for workup for encephalopathy due to possible gastrointestinal symptoms. PMH: breast cancer with intracranial metastasis, chronic pain, depression, migraines, HTN, neuropathy, seizures.    PT Comments    Pt making good progress with mobility. Continues to have cognitive deficits which may be baseline. Continue to recommend HHPT>    Recommendations for follow up therapy are one component of a multi-disciplinary discharge planning process, led by the attending physician.  Recommendations may be updated based on patient status, additional functional criteria and insurance authorization.  Follow Up Recommendations  Home health PT     Assistance Recommended at Discharge Frequent or constant Supervision/Assistance  Patient can return home with the following A little help with walking and/or transfers;A little help with bathing/dressing/bathroom;Assistance with cooking/housework;Direct supervision/assist for medications management;Direct supervision/assist for financial management;Assist for transportation;Help with stairs or ramp for entrance   Equipment Recommendations  None recommended by PT    Recommendations for Other Services       Precautions / Restrictions Precautions Precautions: Fall Precaution Comments: urinary urgency Restrictions Weight Bearing Restrictions: No     Mobility  Bed Mobility Overal bed mobility: Modified Independent                  Transfers Overall transfer level: Needs assistance Equipment used: Rolling walker (2 wheels) Transfers: Sit to/from Stand Sit to Stand: Min guard           General transfer comment: Assist for safety    Ambulation/Gait Ambulation/Gait assistance: Min  guard Gait Distance (Feet): 150 Feet Assistive device: Rolling walker (2 wheels) Gait Pattern/deviations: Step-through pattern, Decreased step length - right, Decreased step length - left, Decreased stride length Gait velocity: decr Gait velocity interpretation: <1.8 ft/sec, indicate of risk for recurrent falls   General Gait Details: Assist for safety and verbal cues to stay on task. Pt easily distracted by other people in the hallway of in other rooms. Shorter, choppier steps with distraction. Had incontinence brief for pt due to incontinence.   Stairs             Wheelchair Mobility    Modified Rankin (Stroke Patients Only)       Balance Overall balance assessment: Needs assistance Sitting-balance support: No upper extremity supported, Feet supported Sitting balance-Leahy Scale: Good     Standing balance support: No upper extremity supported, During functional activity, Bilateral upper extremity supported Standing balance-Leahy Scale: Fair                              Cognition Arousal/Alertness: Awake/alert Behavior During Therapy: WFL for tasks assessed/performed Overall Cognitive Status: No family/caregiver present to determine baseline cognitive functioning Area of Impairment: Orientation, Attention, Memory, Following commands, Awareness, Problem solving, Safety/judgement                 Orientation Level: Disoriented to, Situation, Place Current Attention Level: Sustained Memory: Decreased short-term memory Following Commands: Follows one step commands with increased time Safety/Judgement: Decreased awareness of safety, Decreased awareness of deficits Awareness: Intellectual Problem Solving: Slow processing, Decreased initiation, Difficulty sequencing, Requires verbal cues, Requires tactile cues          Exercises      General Comments  Pertinent Vitals/Pain      Home Living                          Prior  Function            PT Goals (current goals can now be found in the care plan section) Progress towards PT goals: Progressing toward goals    Frequency    Min 3X/week      PT Plan Current plan remains appropriate;Equipment recommendations need to be updated    Co-evaluation              AM-PAC PT "6 Clicks" Mobility   Outcome Measure  Help needed turning from your back to your side while in a flat bed without using bedrails?: None Help needed moving from lying on your back to sitting on the side of a flat bed without using bedrails?: None Help needed moving to and from a bed to a chair (including a wheelchair)?: A Little Help needed standing up from a chair using your arms (e.g., wheelchair or bedside chair)?: A Little Help needed to walk in hospital room?: A Little Help needed climbing 3-5 steps with a railing? : A Little 6 Click Score: 20    End of Session   Activity Tolerance: Patient tolerated treatment well Patient left: in chair;with call bell/phone within reach;with chair alarm set   PT Visit Diagnosis: Unsteadiness on feet (R26.81);Muscle weakness (generalized) (M62.81);History of falling (Z91.81);Other abnormalities of gait and mobility (R26.89)     Time: 9628-3662 PT Time Calculation (min) (ACUTE ONLY): 18 min  Charges:  $Gait Training: 8-22 mins                     [place] Office [place] 02/01/2022, 4:45 PM

## 2022-02-01 NOTE — Progress Notes (Signed)
Occupational Therapy Treatment Patient Details Name: Monica Mccoy MRN: [id number] DOB: [birth year] Today's Date: 02/01/2022   History of present illness 56 y/o female presented to ED on 01/28/22 for increased weakness and confusion x 3 days. EEG negative. Admitted for workup for encephalopathy due to possible gastrointestinal symptoms. PMH: breast cancer with intracranial metastasis, chronic pain, depression, migraines, HTN, neuropathy, seizures.   OT comments  PTA patient reports independent with ADLs, using RW for mobility and having assistance from her sister for IADLs.  Pt report she lives with family who provides 24/7 supervision, therefore unsure prior level of cognition.  Patient admitted for above and limited by impaired cognition, decreased activity tolerance, weakness and impaired balance.  She requires up to min guard for ADLs and mobility using RW, but cueing for safety and problem solving throughout session. Incontinent of urine during session, likely due to urgency, reports typically wears briefs at home.  Based on performance today, recommend continued OT service acutely and after dc at [facility] level to optimize safety and independence with ADLs, reduce risk of falls.  Will follow.    Recommendations for follow up therapy are one component of a multi-disciplinary discharge planning process, led by the attending physician.  Recommendations may be updated based on patient status, additional functional criteria and insurance authorization.    Follow Up Recommendations  Home health OT    Assistance Recommended at Discharge Frequent or constant Supervision/Assistance  Patient can return home with the following  A little help with walking and/or transfers;A little help with bathing/dressing/bathroom;Assistance with cooking/housework;Direct supervision/assist for medications management;Direct supervision/assist for financial management;Assist for transportation;Help with stairs or ramp for  entrance   Equipment Recommendations  BSC/3in1    Recommendations for Other Services      Precautions / Restrictions Precautions Precautions: Fall Precaution Comments: urinary urgency Restrictions Weight Bearing Restrictions: No       Mobility Bed Mobility Overal bed mobility: Modified Independent             General bed mobility comments: no assist required    Transfers                         Balance Overall balance assessment: Needs assistance Sitting-balance support: No upper extremity supported, Feet supported Sitting balance-Leahy Scale: Good     Standing balance support: No upper extremity supported, During functional activity, Bilateral upper extremity supported Standing balance-Leahy Scale: Fair                             ADL either performed or assessed with clinical judgement   ADL Overall ADL's : Needs assistance/impaired     Grooming: Standing;Supervision/safety       Lower Body Bathing: Min guard;Sit to/from stand   Upper Body Dressing : Set up;Sitting   Lower Body Dressing: Min guard;Sit to/from stand   Toilet Transfer: Min guard;Ambulation;Rolling walker (2 wheels);BSC/3in1   Toileting- Clothing Manipulation and Hygiene: Minimal assistance;Sit to/from stand Toileting - Clothing Manipulation Details (indicate cue type and reason): cueing for hygiene and cleanliness     Functional mobility during ADLs: Min guard;Supervision/safety;Rolling walker (2 wheels);Cueing for safety General ADL Comments: cueing for safety with RW and not leaving it behind, incontient of bladder due to urgency in room    Extremity/Trunk Assessment Upper Extremity Assessment Upper Extremity Assessment: Generalized weakness   Lower Extremity Assessment Lower Extremity Assessment: Defer to PT evaluation   Cervical / Trunk Assessment Cervical /  Trunk Assessment: Other exceptions Cervical / Trunk Exceptions: increased body habitus     Vision   Vision Assessment?: No apparent visual deficits   Perception     Praxis      Cognition Arousal/Alertness: Awake/alert Behavior During Therapy: WFL for tasks assessed/performed Overall Cognitive Status: No family/caregiver present to determine baseline cognitive functioning Area of Impairment: Orientation, Attention, Memory, Following commands, Awareness, Problem solving, Safety/judgement                 Orientation Level: Disoriented to, Situation Current Attention Level: Sustained Memory: Decreased short-term memory Following Commands: Follows one step commands with increased time Safety/Judgement: Decreased awareness of safety, Decreased awareness of deficits Awareness: Intellectual Problem Solving: Slow processing, Decreased initiation, Difficulty sequencing, Requires verbal cues, Requires tactile cues General Comments: Pt scored 18/28 on short blessed test with deficits in STM, sequencing and attention. She demonstrates poor awareness of safety as she is able to reports using RW at home and frequent fall demonstrating poor safety and requires cueing to use RW instead of leaving it, but is able to reports her sister assists with meds, cooking and driving at baseline.  Unsure if she is at baseline cognitively.        Exercises      Shoulder Instructions       General Comments VSS    Pertinent Vitals/ Pain       Pain Assessment Pain Assessment: No/denies pain  Home Living Family/patient expects to be discharged to:: Private residence Living Arrangements: Parent;Other (Comment) (sister) Available Help at Discharge: Family;Available 24 hours/day Type of Home: House Home Access: Stairs to enter   Entrance Stairs-Rails: Right Home Layout: Two level;Bed/bath upstairs;Able to live on main level with bedroom/bathroom Alternate Level Stairs-Number of Steps: 1 flight   Bathroom Shower/Tub: Occupational psychologist: Standard     Home Equipment:  Conservation officer, nature (2 wheels);Shower seat          Prior Functioning/Environment              Frequency  Min 2X/week        Progress Toward Goals  OT Goals(current goals can now be found in the care plan section)     Acute Rehab OT Goals Patient Stated Goal: home OT Goal Formulation: With patient Time For Goal Achievement: 02/15/22 Potential to Achieve Goals: Good  Plan      Co-evaluation                 AM-PAC OT "6 Clicks" Daily Activity     Outcome Measure   Help from another person eating meals?: A Little Help from another person taking care of personal grooming?: A Little Help from another person toileting, which includes using toliet, bedpan, or urinal?: A Little Help from another person bathing (including washing, rinsing, drying)?: A Little Help from another person to put on and taking off regular upper body clothing?: A Little Help from another person to put on and taking off regular lower body clothing?: A Little 6 Click Score: 18    End of Session Equipment Utilized During Treatment: Rolling walker (2 wheels)  OT Visit Diagnosis: Other abnormalities of gait and mobility (R26.89);Muscle weakness (generalized) (M62.81);History of falling (Z91.81);Other symptoms and signs involving cognitive function   Activity Tolerance Patient tolerated treatment well   Patient Left in bed;with call bell/phone within reach;with bed alarm set   Nurse Communication Mobility status        Time: 2500-3704 OT Time Calculation (min): 30 min  Charges: OT General Charges $OT Visit: 1 Visit OT Evaluation $OT Eval Moderate Complexity: 1 Mod OT Treatments $Self Care/Home Management : 8-22 mins  Jolaine Artist, OT Acute Rehabilitation Services Office [phone]   Delight Stare 02/01/2022, 12:01 PM

## 2022-02-01 NOTE — TOC Initial Note (Signed)
Transition of Care [facility]) - Initial/Assessment Note    Patient Details  Name: Monica Mccoy MRN: [id number] Date of Birth: 1966-03-29  [facility] CM/SW Contact:    Pollie Friar, RN Phone Number: 02/01/2022, 1:16 PM  Clinical Narrative:                 Patient is from home with her parents and sister. She states her sister oversees her medications and provides needed transportation. Recommendations are for home health services. She states she has used home health in the past and would like to use the same agency again. CM was able to find she used [facility]. CM will arrange HH with [facility] prior to d/c.  TOC following.  Expected Discharge Plan: [place] Barriers to Discharge: Continued Medical Work up   Patient Goals and CMS Choice   CMS Medicare.gov Compare Post Acute Care list provided to:: Patient Choice offered to / list presented to : Patient  Expected Discharge Plan and Services Expected Discharge Plan: [place]   Discharge Planning Services: CM Consult Post Acute Care Choice: [place] arrangements for the past 2 months: Single Family Home                           HH Arranged: PT, OT          Prior Living Arrangements/Services Living arrangements for the past 2 months: Single Family Home Lives with:: Parents, Siblings Patient language and need for interpreter reviewed:: Yes Do you feel safe going back to the place where you live?: Yes        Care giver support system in place?: Yes (comment) Current home services: DME (walker) Criminal Activity/Legal Involvement Pertinent to Current Situation/Hospitalization: No - Comment as needed  Activities of Daily Living Home Assistive Devices/Equipment: Walker (specify type) ADL Screening (condition at time of admission) Patient's cognitive ability adequate to safely complete daily activities?: No Is the patient deaf or have difficulty hearing?:  No Does the patient have difficulty seeing, even when wearing glasses/contacts?: No Does the patient have difficulty concentrating, remembering, or making decisions?: Yes Patient able to express need for assistance with ADLs?: Yes Does the patient have difficulty dressing or bathing?: Yes Independently performs ADLs?: No Communication: Independent Dressing (OT): Needs assistance Is this a change from baseline?: Pre-admission baseline Grooming: Independent Feeding: Independent Bathing: Needs assistance Is this a change from baseline?: Pre-admission baseline Toileting: Needs assistance Is this a change from baseline?: Pre-admission baseline In/Out Bed: Needs assistance Is this a change from baseline?: Pre-admission baseline Walks in Home: Needs assistance Is this a change from baseline?: Pre-admission baseline Does the patient have difficulty walking or climbing stairs?: Yes Weakness of Legs: Both Weakness of Arms/Hands: None  Permission Sought/Granted                  Emotional Assessment Appearance:: Appears stated age Attitude/Demeanor/Rapport: Engaged Affect (typically observed): Accepting Orientation: : Oriented to Self, Oriented to Place, Oriented to  Time, Oriented to Situation   Psych Involvement: No (comment)  Admission diagnosis:  Acute encephalopathy [W97.98] Metabolic encephalopathy [X21.19] Patient Active Problem List   Diagnosis Date Noted   Metabolic encephalopathy 41/74/0814   Cholelithiasis 10/16/2021   Uterine fibroid 10/16/2021   Aortic atherosclerosis ([place]) 10/16/2021   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   Localization-related (focal) (partial) symptomatic epilepsy and epileptic syndromes with complex partial seizures, not intractable, without status epilepticus ([place])  09/11/2021   Essential hypertension 05/30/2021   Acute encephalopathy 05/19/2021   Port-A-Cath in place 01/16/2021   Migraine without aura 12/19/2020   Cancer of right  breast metastatic to brain ([place]) 09/22/2020   Gastroesophageal reflux disease without esophagitis 06/29/2020   Other chronic pain 06/29/2020   Moderate episode of recurrent major depressive disorder ([place]) 06/29/2020   Urinary incontinence 06/29/2020   PCP:  Haydee Salter, MD Pharmacy:   [id number]: [phone]: [phone]    Social Determinants of Health (SDOH) Interventions    Readmission Risk Interventions     No data to display

## 2022-02-01 NOTE — Progress Notes (Signed)
Subjective: Patient continues to improve. lately.   Exam: Vitals:   02/01/22 0900 02/01/22 0918  BP: (!) 147/70 (!) 147/70  Pulse: 87 87  Resp: 18 18  Temp: 97.8 F (36.6 C) 97.8 F (36.6 C)  SpO2: 100% 100%   Gen: In bed, NAD Resp: non-labored breathing, no acute distress Abd: soft, nt  Neuro: MS: awake, alert, she is able to name simple objects much more quickly, able to repeat. CN:VFF, EOMI, no gaze preference Motor: MAEW    Pertinent Labs: Cr 0.94  Impression: 56 year old female with worsening of her underlying chronic neurological complaints.  Possibilities include recurrent partial seizures, "peeling the onion" in the setting of GI illness, or given howmuch she improved with thiamine, wernicke's.  Given that she was symptomatic for so long, with sudden improvement after starting thiamine, I do think that Wernicke's is a consideration, though she is not a typical patient felt to be at risk.  Unfortunately her B1 level was checked after repletion, so will not be helpful.  Recurrent partial seizures are also a consideration given that she had her dose of Depakote reduced couple of months ago, and given that she had the EEG monitoring after improvement I am not sure this was excluded.  That being said there is also no clear indication that she did have a seizure, so would not increase her antiepileptics at this time.  Finally it is possible that this simply represented recrudescence of her previous symptoms in the setting of her GI illness in which case care would be supportive.  At this time no further testing, unless she were to be worsened.  Neurology will be available as needed.   Recommendations: 1) Continue thiamine repletion for total 9 doses, then '100mg'$  daily PO.  2) PT, OT 3) will follow.   Roland Rack, MD [facility] [phone]  If 7pm- 7am, please page neurology on call as listed in [place].

## 2022-02-01 NOTE — Plan of Care (Signed)
Pt alert and oriented person/place. Confused to time and situation. Pt med compliant. Vitals stable. No prns given.  Problem: Education: Goal: Knowledge of General Education information will improve Description: Including pain rating scale, medication(s)/side effects and non-pharmacologic comfort measures Outcome: Progressing   Problem: Health Behavior/Discharge Planning: Goal: Ability to manage health-related needs will improve Outcome: Progressing   Problem: Clinical Measurements: Goal: Ability to maintain clinical measurements within normal limits will improve Outcome: Progressing Goal: Will remain free from infection Outcome: Progressing Goal: Diagnostic test results will improve Outcome: Progressing Goal: Respiratory complications will improve Outcome: Progressing Goal: Cardiovascular complication will be avoided Outcome: Progressing   Problem: Activity: Goal: Risk for activity intolerance will decrease Outcome: Progressing   Problem: Nutrition: Goal: Adequate nutrition will be maintained Outcome: Progressing   Problem: Coping: Goal: Level of anxiety will decrease Outcome: Progressing   Problem: Elimination: Goal: Will not experience complications related to bowel motility Outcome: Progressing Goal: Will not experience complications related to urinary retention Outcome: Progressing   Problem: Pain Managment: Goal: General experience of comfort will improve Outcome: Progressing   Problem: Safety: Goal: Ability to remain free from injury will improve Outcome: Progressing   Problem: Skin Integrity: Goal: Risk for impaired skin integrity will decrease Outcome: Progressing

## 2022-02-02 DIAGNOSIS — G9340 Encephalopathy, unspecified: Secondary | ICD-10-CM | POA: Diagnosis not present

## 2022-02-02 LAB — COMPREHENSIVE METABOLIC PANEL
ALT: 10 U/L (ref 0–44)
AST: 17 U/L (ref 15–41)
Albumin: 3 g/dL — ABNORMAL LOW (ref 3.5–5.0)
Alkaline Phosphatase: 53 U/L (ref 38–126)
Anion gap: 4 — ABNORMAL LOW (ref 5–15)
BUN: 10 mg/dL (ref 6–20)
CO2: 27 mmol/L (ref 22–32)
Calcium: 8.4 mg/dL — ABNORMAL LOW (ref 8.9–10.3)
Chloride: 107 mmol/L (ref 98–111)
Creatinine, Ser: 0.8 mg/dL (ref 0.44–1.00)
GFR, Estimated: >60 mL/min (ref >60)
Glucose, Bld: 90 mg/dL (ref 70–99)
Potassium: 4 mmol/L (ref 3.5–5.1)
Sodium: 138 mmol/L (ref 135–145)
Total Bilirubin: 0.5 mg/dL (ref 0.3–1.2)
Total Protein: 6.4 g/dL — ABNORMAL LOW (ref 6.5–8.1)

## 2022-02-02 LAB — CBC WITH DIFFERENTIAL/PLATELET
Abs Immature Granulocytes: 0.01 10*3/uL (ref 0.00–0.07)
Basophils Absolute: 0 10*3/uL (ref 0.0–0.1)
Basophils Relative: 1 %
Eosinophils Absolute: 0.1 10*3/uL (ref 0.0–0.5)
Eosinophils Relative: 5 %
HCT: 35.1 % — ABNORMAL LOW (ref 36.0–46.0)
Hemoglobin: 11.1 g/dL — ABNORMAL LOW (ref 12.0–15.0)
Immature Granulocytes: 0 %
Lymphocytes Relative: 28 %
Lymphs Abs: 0.8 10*3/uL (ref 0.7–4.0)
MCH: 29.6 pg (ref 26.0–34.0)
MCHC: 31.6 g/dL (ref 30.0–36.0)
MCV: 93.6 fL (ref 80.0–100.0)
Monocytes Absolute: 0.5 10*3/uL (ref 0.1–1.0)
Monocytes Relative: 19 %
Neutro Abs: 1.2 10*3/uL — ABNORMAL LOW (ref 1.7–7.7)
Neutrophils Relative %: 47 %
Platelets: 167 10*3/uL (ref 150–400)
RBC: 3.75 MIL/uL — ABNORMAL LOW (ref 3.87–5.11)
RDW: 13.8 % (ref 11.5–15.5)
WBC: 2.6 10*3/uL — ABNORMAL LOW (ref 4.0–10.5)
nRBC: 0 % (ref 0.0–0.2)

## 2022-02-02 LAB — GLUCOSE, CAPILLARY: Glucose-Capillary: 79 mg/dL (ref 70–99)

## 2022-02-02 LAB — VITAMIN B1: Vitamin B1 (Thiamine): 229.2 nmol/L — ABNORMAL HIGH (ref 66.5–200.0)

## 2022-02-02 MED ORDER — DICLOFENAC SODIUM 50 MG PO TBEC: 50.0000 mg | DELAYED_RELEASE_TABLET | Freq: Two times a day (BID) | ORAL | 1 refills | Status: DC

## 2022-02-02 MED ORDER — GABAPENTIN 300 MG PO CAPS: 300.0000 mg | ORAL_CAPSULE | Freq: Two times a day (BID) | ORAL | 2 refills | Status: DC

## 2022-02-02 MED ORDER — THIAMINE HCL 100 MG PO TABS: 100.0000 mg | ORAL_TABLET | Freq: Every day | ORAL | 11 refills | Status: DC

## 2022-02-02 MED ORDER — GABAPENTIN 300 MG PO CAPS: 600.0000 mg | ORAL_CAPSULE | Freq: Every day | ORAL | 0 refills | Status: DC

## 2022-02-02 MED ORDER — HEPARIN SOD (PORK) LOCK FLUSH 100 UNIT/ML IV SOLN
500.0000 [IU] | INTRAVENOUS | Status: AC | PRN
Start: 2022-02-02 — End: 2022-02-02
  Administered 2022-02-02: 500 [IU]

## 2022-02-02 MED ORDER — PANTOPRAZOLE SODIUM 40 MG PO TBEC: 40.0000 mg | DELAYED_RELEASE_TABLET | Freq: Every day | ORAL | 0 refills | Status: DC

## 2022-02-02 NOTE — Care Management Important Message (Signed)
Important Message  Patient Details  Name: Monica Mccoy MRN: [id number] Date of Birth: 25-Oct-1965   Medicare Important Message Given:  Yes     Caliah Kopke 02/02/2022, 2:02 PM

## 2022-02-02 NOTE — TOC Progression Note (Signed)
Transition of Care [facility]) - Progression Note    Patient Details  Name: Monica Mccoy MRN: [id number] Date of Birth: 1966-04-15  Transition of Care [facility]) CM/SW Contact  Pollie Friar, RN Phone Number: 02/02/2022, 12:44 PM  Clinical Narrative:    CM spoke to patients sister, [name]. She states the patient has 24 hour supervision and the family manages her meds, finances, etc. Sister in agreement with using [facility] for [facility]. CM has placed information on the AVS and updated Amy with [facility]. [name] interested in having aides to assist at home. CM will fax in forms to [employer] [facility] [employer] to see if pt will qualify.  Family able to transport home when she is medically ready.  Expected Discharge Plan: [place] Barriers to Discharge: Continued Medical Work up  Expected Discharge Plan and Services Expected Discharge Plan: [place]   Discharge Planning Services: CM Consult Post Acute Care Choice: [place] arrangements for the past 2 months: Single Family Home                           HH Arranged: PT, OT           Social Determinants of Health (SDOH) Interventions    Readmission Risk Interventions     No data to display

## 2022-02-02 NOTE — TOC Transition Note (Signed)
Transition of Care [facility]) - CM/SW Discharge Note   Patient Details  Name: Monica Mccoy MRN: [id number] Date of Birth: 01/04/1966  [facility] of [facility]) CM/SW Contact:  Pollie Friar, RN Phone Number: 02/02/2022, 2:17 PM   Clinical Narrative:    Patient discharging home with home health services through [place] [facility]. She has walker at home.  Forms for aides sent to [facility].  Sister to provide transportation home today. Bedside RN updated.     Final next level of care: Home w Home Health Services Barriers to Discharge: No Barriers Identified   Patient Goals and CMS Choice   CMS Medicare.gov Compare Post Acute Care list provided to:: Patient Choice offered to / list presented to : Patient, Sibling  Discharge Placement                       Discharge Plan and Services   Discharge Planning Services: CM Consult Post Acute Care Choice: Home Health                    HH Arranged: PT, OT [facility] Agency: [place] Date [place]: 02/02/22   Representative spoke with at [place]: Amy  Social Determinants of Health ([place]) Interventions     Readmission Risk Interventions     No data to display

## 2022-02-02 NOTE — Discharge Summary (Signed)
Physician Discharge Summary  Monica Mccoy KNL:[id number] DOB: 28-Aug-1965 DOA: 01/28/2022  PCP: Haydee Salter, MD  Admit date: 01/28/2022 Discharge date: 02/02/2022  Time spent: 36 minutes  Recommendations for Outpatient Follow-up:  Chem-12 CBC magnesium in about 1 week Recommend close monitoring the outpatient setting with regards to cognition-is on higher doses of gabapentin can continue Depakote and may need levels of Depakote checked depending on preference of primary physician/neurologist Outpatient further surveillance with oncology for underlying breast cancer  Discharge Diagnoses:  MAIN problem for hospitalization   Seizures  Please see below for itemized issues addressed in [place]- refer to other progress notes for clarity if needed  Discharge Condition: Improved  Diet recommendation: Heart healthy  Filed Weights   01/28/22 1718  Weight: (!) 138.3 kg    History of present illness:  56 year old home dwelling blk fem 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 Delice Lesch and at last visit 06/07/2021 gabapentin was increased to 300 a.m. 300 2 PM and 600 at bedtime and continue Depakote Underlying seizures from radiation?  Meds?  On Depakote for headaches Depression Neuropathy HTN   Admitted 6/4 for increased weakness confusion?  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 Continue current Depakote 250 twice daily, gabapentin 300 twice daily with 600 at bedtime Avoid tramado given history risk of lowering seizure threshold and use other agents for neuropathic pain--replaced with Voltaren this hospital stay EEG discontinued after patient demonstrated stability over the past 48 hours Encephalopathy--etiology was not confirmed but could have been secondary to Wernicke's as a consideration Replaced B1, replacing thiamine and IV doses were given--it was recommended that the patient discharged on oral thiamine 100 daily after the IV doses had been given replacing magnesium orally We replace patient's potassium during hospital stay and this was stopped on discharge we also replaced the patient's magnesium orally HTN Continue amlodipine 5 Holding Lasix 20 Depression Cymbalta was held on discharge  Discharge Exam: Vitals:   02/02/22 0431 02/02/22 0817  BP: 95/60 (!) 157/89  Pulse: 75 75  Resp: 20 19  Temp: 98.3 F (36.8 C) 97.8 F (36.6 C)  SpO2: 100% 100%    Subj on day of d/c   Awake-cannot tell me at times to place time and date but sometimes confabulates No distress She seems about the same to marginally improved compared to yesterday  General Exam on discharge  Thick neck Mallampati 4 EOMI NCAT no focal deficit Chest is clear without rales rhonchi or wheeze No added sound Abdomen is obese nontender no rebound no guarding She is moving all 4 limbs equally She does have some dysnomia at times Power is 5/5  Discharge Instructions   Discharge Instructions     Diet - low sodium heart healthy   Complete by: As directed    Discharge instructions  Complete by: As directed    Do not use tramadol Cymbalta any longer and hold off on Myrbetriq Would recommend using Voltaren for pain beyond the capability of Tylenol to take care of Would  also recommend close outpatient follow-up and management with her primary physician and minimize polypharmacy I expect that she will still have some cognitive issues and that this may persist because of underlying seizure disorder-noticed that the gabapentin has been changed in terms of dosing and follow the instructions carefully Best of luck have a good summer please return to the hospital if you have severe seizures loss of consciousness severe headaches or pains Make sure that u take the Thiamine tablets as well--this will help cognition   Increase activity slowly   Complete by: As directed       Allergies as of 02/02/2022       Reactions   Dilaudid [hydromorphone Hcl] Other (See Comments)   Confusion & hallucinations   Morphine And Related Other (See Comments)   HA   Penicillins    Tramadol         Medication List     STOP taking these medications    acetaminophen 500 MG tablet Commonly known as: TYLENOL   DULoxetine 60 MG capsule Commonly known as: CYMBALTA   furosemide 20 MG tablet Commonly known as: LASIX   traMADol 50 MG tablet Commonly known as: ULTRAM       TAKE these medications    amLODipine 5 MG tablet Commonly known as: NORVASC TAKE 1 TABLET (5 MG TOTAL) BY MOUTH DAILY.   diclofenac 50 MG EC tablet Commonly known as: VOLTAREN Take 1 tablet (50 mg total) by mouth 2 (two) times daily with a meal. Make sure eats or takes something before using this medicine   divalproex 250 MG DR tablet Commonly known as: DEPAKOTE TAKE 1 TABLET BY MOUTH TWICE A DAY   gabapentin 300 MG capsule Commonly known as: NEURONTIN Take 1 capsule (300 mg total) by mouth 2 (two) times daily between meals. What changed:  how much to take how to take this when to take this additional instructions   gabapentin 300 MG capsule Commonly known as: NEURONTIN Take 2 capsules (600 mg total) by mouth at bedtime. What changed: You were already taking a medication with the same  name, and this prescription was added. Make sure you understand how and when to take each.   MELATONIN GUMMIES PO Take 10 mg by mouth.   mirabegron ER 50 MG Tb24 tablet Commonly known as: Myrbetriq Take 1 tablet (50 mg total) by mouth daily.   pantoprazole 40 MG tablet Commonly known as: PROTONIX Take 1 tablet (40 mg total) by mouth daily. Start taking on: February 03, 2022   thiamine 100 MG tablet Take 1 tablet (100 mg total) by mouth daily.       Allergies  Allergen Reactions   Dilaudid [Hydromorphone Hcl] Other (See Comments)    Confusion & hallucinations   Morphine And Related Other (See Comments)    HA   Penicillins    Tramadol     Follow-up [place]. Follow up.   Why: The home health agency will contact you for the first home visit. Contact information: [place] [address] [phone]                  The results of significant diagnostics from this hospitalization (including imaging, microbiology, ancillary  and laboratory) are listed below for reference.    Significant Diagnostic Studies: Overnight EEG with video  Result Date: 01/31/2022 Lora Havens, MD     01/31/2022  2:03 PM Patient Name: Jurnee Nakayama MRN: [id number] Epilepsy Attending: Lora Havens Referring Physician/Provider: Greta Doom, MD Duration: 01/30/2022 2225 to 01/31/2022 1000  Patient history: 56 y.o. female with a history of breast cancer with intracranial metastasis, partial seizure disorder, history of whole brain radiation who presents with altered mental status. EEG to evaluate for seizure.  Level of alertness: Awake, asleep  AEDs during EEG study: GBP  Technical aspects: This EEG study was done with scalp electrodes positioned according to the 10-20 International system of electrode placement. Electrical activity was acquired at a sampling rate of '500Hz'$  and reviewed with a high frequency filter of '70Hz'$  and a low  frequency filter of '1Hz'$ . EEG data were recorded continuously and digitally stored.  Description:  The posterior dominant rhythm consists of 8 Hz activity of moderate voltage (25-35 uV) seen predominantly in posterior head regions, symmetric and reactive to eye opening and eye closing. Sleep was characterized by vertex waves, sleep spindles (12-'14Hz'$ ), maximal frontocentral region. EEG showed continuous polymorphic sharply contoured 3 to 6 Hz theta-delta slowing in left frontal region consistent with breach artifact.There is also intermittent generalized 3-'6hz'$  theta-delta slowing.   ABNORMALITY - Breach artifact, left frontal region - Intermittent slow, generalized  IMPRESSION: This study is suggestive of cortical dysfunction arising from left frontal region consistent with prior craniotomy. There is also mild diffuse encephalopathy, nonspecific etiology. No seizures or definite epileptiform discharges were seen throughout the recording.   Lora Havens   EEG adult  Result Date: 01/30/2022 Lora Havens, MD     01/30/2022  8:30 AM Patient Name: Kaelyn Innocent MRN: [id number] Epilepsy Attending: Lora Havens Referring Physician/Provider: Marcelyn Bruins, MD Date: 01/29/2022 Duration: 22.30 mins Patient history: 56 y.o. female with a history of breast cancer with intracranial metastasis, partial seizure disorder, history of whole brain radiation who presents with altered mental status. EEG to evaluate for seizure. Level of alertness: Awake, asleep AEDs during EEG study: GBP Technical aspects: This EEG study was done with scalp electrodes positioned according to the 10-20 International system of electrode placement. Electrical activity was acquired at a sampling rate of '500Hz'$  and reviewed with a high frequency filter of '70Hz'$  and a low frequency filter of '1Hz'$ . EEG data were recorded continuously and digitally stored. Description:  The posterior dominant rhythm consists of 8 Hz activity of moderate voltage  (25-35 uV) seen predominantly in posterior head regions, symmetric and reactive to eye opening and eye closing. Sleep was characterized by vertex waves, sleep spindles (12-'14Hz'$ ), maximal frontocentral region. EEG showed continuous polymorphic sharply contoured 3 to 6 Hz theta-delta slowing in left frontal region consistent with breach artifact.There is also intermittent generalized 3-'6hz'$  theta-delta slowing.   ABNORMALITY - Breach artifact, left frontal region - Intermittent slow, generalized  IMPRESSION: This study is suggestive of cortical dysfunction arising from left frontal region consistent with prior craniotomy. There is also mild diffuse encephalopathy, nonspecific etiology. No seizures or definite epileptiform discharges were seen throughout the recording. Lora Havens   MR Brain W and Wo Contrast  Result Date: 01/29/2022 CLINICAL DATA:  Altered mental status, nontraumatic; history of metastatic breast cancer EXAM: MRI HEAD WITHOUT AND WITH CONTRAST TECHNIQUE: Multiplanar, multiecho pulse sequences of the brain and surrounding structures were obtained without and with intravenous contrast. CONTRAST:  11m GADAVIST  GADOBUTROL 1 MMOL/ML IV SOLN COMPARISON:  11/28/2021 FINDINGS: Brain: There is no acute infarction or intracranial hemorrhage. Encephalomalacia in the left frontal lobe underlying craniotomy. Additional patchy and confluent areas of T2 hyperintensity the supratentorial white matter are nonspecific and may reflect chronic microvascular and/or therapy related changes. No new abnormal parenchymal enhancement. Thin left cerebral dural enhancement. Scattered foci of chronic blood products are again noted. Prominence of the ventricles and sulci reflects similar parenchymal volume loss. There is no intracranial mass, mass effect, or edema. There is no hydrocephalus or extra-axial fluid collection. Vascular: Major vessel flow voids at the skull base are preserved. Skull and upper cervical spine:  Normal marrow signal is preserved. Left craniotomy. Sinuses/Orbits: Minor mucosal thickening.  Left lens replacement. Other: Sella is unremarkable.  Chronic bilateral mastoid effusions. IMPRESSION: No evidence of intracranial metastatic disease other acute abnormality. Stable chronic findings detailed above. Electronically Signed   By: Macy Mis M.D.   On: 01/29/2022 10:34   CT Head Wo Contrast  Result Date: 01/28/2022 CLINICAL DATA:  Initial evaluation for altered mental status. EXAM: CT HEAD WITHOUT CONTRAST TECHNIQUE: Contiguous axial images were obtained from the base of the skull through the vertex without intravenous contrast. RADIATION DOSE REDUCTION: This exam was performed according to the departmental dose-optimization program which includes automated exposure control, adjustment of the mA and/or kV according to patient size and/or use of iterative reconstruction technique. COMPARISON:  Prior MRI from 09/26/2021. FINDINGS: Brain: Generalize cerebral atrophy, stable. Postoperative changes from prior left frontal craniotomy again noted. Underlying encephalomalacia and gliosis within the underlying left frontotemporal region, relatively stable. Scattered and confluent hypodensity elsewhere within the left greater than right supratentorial cerebral white matter, also stable, likely reflecting post treatment/post radiation changes. Calcification/mineralization at the pons noted, stable. No acute intracranial hemorrhage. No visible large vessel territory infarct. No visible new mass lesion or metastatic disease. Mass effect or midline shift. No hydrocephalus or extra-axial fluid collection. Vascular: No hyperdense vessel. Skull: Scalp soft tissues demonstrate no acute finding. Prior left frontal craniotomy. Sinuses/Orbits: Globes orbital soft tissues demonstrate no acute finding. Prior ocular lens replacement on the left. Paranasal sinuses are clear. Small right greater than left mastoid effusions noted.  Other: None. IMPRESSION: 1. No acute intracranial abnormality. 2. Postoperative changes from prior left frontal craniotomy with underlying encephalomalacia and gliosis within the underlying left frontotemporal region, stable. Confluent hypodensity elsewhere within the left greater than right cerebral hemispheres likely reflecting post radiation changes, stable. No new mass lesion or metastatic disease evident by CT. 3. Underlying atrophy, stable. Electronically Signed   By: Jeannine Boga M.D.   On: 01/28/2022 22:09   CT ABDOMEN PELVIS W CONTRAST  Result Date: 01/28/2022 CLINICAL DATA:  Acute abdominal pain. Electronic records indicates history of breast cancer. EXAM: CT ABDOMEN AND PELVIS WITH CONTRAST TECHNIQUE: Multidetector CT imaging of the abdomen and pelvis was performed using the standard protocol following bolus administration of intravenous contrast. RADIATION DOSE REDUCTION: This exam was performed according to the departmental dose-optimization program which includes automated exposure control, adjustment of the mA and/or kV according to patient size and/or use of iterative reconstruction technique. CONTRAST:  132m OMNIPAQUE IOHEXOL 300 MG/ML  SOLN COMPARISON:  09/26/2021 FINDINGS: Lower chest: Heart is upper normal in size. Tiny punctate subpleural nodules in the right lower lobe is unchanged from prior chest CT. Linear scarring in the dependent right lower lobe. No pleural effusion. Hepatobiliary: No focal liver abnormality is seen. Previous gallstone is not definitively seen on the current  exam. No pericholecystic inflammation. No biliary dilatation. Pancreas: No ductal dilatation or inflammation. Spleen: Normal in size without focal abnormality. Adrenals/Urinary Tract: Normal adrenal glands. No hydronephrosis or perinephric edema. Homogeneous renal enhancement with symmetric excretion on delayed phase imaging. No focal renal abnormality. Urinary bladder is physiologically distended without  wall thickening. Stomach/Bowel: Unremarkable stomach. No bowel obstruction or inflammation. Enteric sutures in the sigmoid. High-riding cecum in the right mid abdomen. Normal appendix. Vascular/Lymphatic: Normal caliber abdominal aorta. Patent portal vein. No adenopathy. Reproductive: Fibroid uterus. No evidence of extra uterine adnexal mass. Other: No ascites. No free air. Small fat containing umbilical hernia. There is no omental thickening. Musculoskeletal: Moderate left hip osteoarthritis. No evidence of focal bone lesion. IMPRESSION: 1. No acute abnormality or explanation for abdominal pain. 2. Fibroid uterus. Electronically Signed   By: Keith Rake M.D.   On: 01/28/2022 21:48   DG Chest 2 View  Result Date: 01/28/2022 CLINICAL DATA:  Shortness of breath, weakness and confusion. EXAM: CHEST - 2 VIEW COMPARISON:  Chest radiograph 11/28/2021, CT 09/26/2021 FINDINGS: Low lung volumes. Left chest port remains in place. Upper normal heart size which is likely accentuated by technique and low lung volumes. Increased atelectasis at the right lung base from prior exam. No confluent consolidation. No pneumothorax or large pleural effusion. Right axillary surgical clips. On limited assessment, no acute osseous abnormalities are seen. IMPRESSION: Low lung volumes with increased right basilar atelectasis. Electronically Signed   By: Keith Rake M.D.   On: 01/28/2022 18:22    Microbiology: Recent Results (from the past 240 hour(s))  Urine Culture     Status: Abnormal   Collection Time: 01/28/22  6:16 PM   Specimen: Urine, Clean Catch  Result Value Ref Range Status   Specimen Description   Final    URINE, CLEAN CATCH Performed at [facility], [place] [address], [place], [place] [address]    Special Requests   Final    NONE Performed at [facility], [place] [address], [place], [place] [address]    Culture (A)  Final    80,000 COLONIES/mL ESCHERICHIA  COLI >=100,000 COLONIES/mL STREPTOCOCCUS GALLOLYTICUS    Report Status 01/31/2022 FINAL  Final   Organism ID, Bacteria ESCHERICHIA COLI (A)  Final   Organism ID, Bacteria STREPTOCOCCUS GALLOLYTICUS (A)  Final      Susceptibility   Escherichia coli - MIC*    AMPICILLIN 8 SENSITIVE Sensitive     CEFAZOLIN <=4 SENSITIVE Sensitive     CEFEPIME <=0.12 SENSITIVE Sensitive     CEFTRIAXONE <=0.25 SENSITIVE Sensitive     CIPROFLOXACIN >=4 RESISTANT Resistant     GENTAMICIN <=1 SENSITIVE Sensitive     IMIPENEM <=0.25 SENSITIVE Sensitive     NITROFURANTOIN <=16 SENSITIVE Sensitive     TRIMETH/SULFA <=20 SENSITIVE Sensitive     AMPICILLIN/SULBACTAM <=2 SENSITIVE Sensitive     PIP/TAZO <=4 SENSITIVE Sensitive     * 80,000 COLONIES/mL ESCHERICHIA COLI   Streptococcus gallolyticus - MIC*    PENICILLIN <=0.06 SENSITIVE Sensitive     CEFTRIAXONE <=0.12 SENSITIVE Sensitive     ERYTHROMYCIN <=0.12 SENSITIVE Sensitive     LEVOFLOXACIN >=16 RESISTANT Resistant     VANCOMYCIN 0.25 SENSITIVE Sensitive     * >=100,000 COLONIES/mL STREPTOCOCCUS GALLOLYTICUS  SARS Coronavirus 2 by RT PCR (hospital order, performed in [facility] hospital lab) *cepheid single result test* Anterior Nasal Swab     Status: None   Collection Time: 01/28/22 10:54 PM   Specimen: Anterior Nasal  Swab  Result Value Ref Range Status   SARS Coronavirus 2 by RT PCR NEGATIVE NEGATIVE Final    Comment: (NOTE) SARS-CoV-2 target nucleic acids are NOT DETECTED.  The SARS-CoV-2 RNA is generally detectable in upper and lower respiratory specimens during the acute phase of infection. The lowest concentration of SARS-CoV-2 viral copies this assay can detect is 250 copies / mL. A negative result does not preclude SARS-CoV-2 infection and should not be used as the sole basis for treatment or other patient management decisions.  A negative result may occur with improper specimen collection / handling, submission of specimen other than  nasopharyngeal swab, presence of viral mutation(s) within the areas targeted by this assay, and inadequate number of viral copies (<250 copies / mL). A negative result must be combined with clinical observations, patient history, and epidemiological information.  Fact Sheet for Patients:   https://www.patel.info/  Fact Sheet for Healthcare Providers: https://hall.com/  This test is not yet approved or  cleared by the [place] FDA and has been authorized for detection and/or diagnosis of SARS-CoV-2 by FDA under an Emergency Use Authorization (EUA).  This EUA will remain in effect (meaning this test can be used) for the duration of the COVID-19 declaration under Section 564(b)(1) of the Act, 21 U.S.C. section 360bbb-3(b)(1), unless the authorization is terminated or revoked sooner.  Performed at [facility], [place] [address] [place], [place] [address]      Labs: Basic Metabolic Panel: Recent Labs  Lab 01/28/22 1758 01/30/22 0332 01/31/22 0345 02/01/22 0502 02/02/22 1226  NA 137 140 139 138 138  K 3.4* 2.9* 3.3* 4.0 4.0  CL 103 107 109 108 107  CO2 '23 27 25 27 27  '$ GLUCOSE 91 103* 108* 99 90  BUN '8 7 10 9 10  '$ CREATININE 0.73 0.85 0.94 0.85 0.80  CALCIUM 9.3 8.9 8.6* 8.6* 8.4*  MG  --   --  1.8 1.9  --    Liver Function Tests: Recent Labs  Lab 01/28/22 1758 02/01/22 0502 02/02/22 1226  AST 19 14* 17  ALT '10 9 10  '$ ALKPHOS 66 55 53  BILITOT 1.0 0.9 0.5  PROT 8.4* 6.1* 6.4*  ALBUMIN 3.9 2.8* 3.0*   Recent Labs  Lab 01/28/22 1758  LIPASE 21   Recent Labs  Lab 01/28/22 2307  AMMONIA 32   CBC: Recent Labs  Lab 01/28/22 1758 01/30/22 0332 02/02/22 1226  WBC 5.1 3.5* 2.6*  NEUTROABS  --   --  1.2*  HGB 12.8 11.4* 11.1*  HCT 41.3 35.3* 35.1*  MCV 94.1 91.7 93.6  PLT 188 174 167   Cardiac Enzymes: No results for input(s): "CKTOTAL", "CKMB", "CKMBINDEX", "TROPONINI" in the last 168  hours. BNP: BNP (last 3 results) No results for input(s): "BNP" in the last 8760 hours.  ProBNP (last 3 results) No results for input(s): "PROBNP" in the last 8760 hours.  CBG: Recent Labs  Lab 02/02/22 0818  GLUCAP 79       Signed:  Nita Sells MD   [facility] 02/02/2022, 2:03 PM

## 2022-02-02 NOTE — Plan of Care (Signed)
Pt is oriented x 1-2, pt is ambulatory stand by assist. Pt has purewick in place, changed this am.    Problem: Education: Goal: Knowledge of General Education information will improve Description: Including pain rating scale, medication(s)/side effects and non-pharmacologic comfort measures Outcome: Progressing   Problem: Health Behavior/Discharge Planning: Goal: Ability to manage health-related needs will improve Outcome: Progressing   Problem: Clinical Measurements: Goal: Ability to maintain clinical measurements within normal limits will improve Outcome: Progressing Goal: Will remain free from infection Outcome: Progressing Goal: Diagnostic test results will improve Outcome: Progressing Goal: Respiratory complications will improve Outcome: Progressing Goal: Cardiovascular complication will be avoided Outcome: Progressing   Problem: Activity: Goal: Risk for activity intolerance will decrease Outcome: Progressing   Problem: Nutrition: Goal: Adequate nutrition will be maintained Outcome: Progressing   Problem: Coping: Goal: Level of anxiety will decrease Outcome: Progressing   Problem: Elimination: Goal: Will not experience complications related to bowel motility Outcome: Progressing Goal: Will not experience complications related to urinary retention Outcome: Progressing   Problem: Pain Managment: Goal: General experience of comfort will improve Outcome: Progressing   Problem: Safety: Goal: Ability to remain free from injury will improve Outcome: Progressing   Problem: Skin Integrity: Goal: Risk for impaired skin integrity will decrease Outcome: Progressing

## 2022-02-02 NOTE — Care Management Important Message (Signed)
Important Message  Patient Details  Name: Monica Mccoy MRN: [id number] Date of Birth: 1966-06-21   Medicare Important Message Given:  Yes     Abdinasir Spadafore 02/02/2022, 11:59 AM

## 2022-02-02 NOTE — Plan of Care (Signed)

## 2022-02-08 ENCOUNTER — Other Ambulatory Visit: Payer: Self-pay | Admitting: *Deleted

## 2022-02-08 DIAGNOSIS — C50911 Malignant neoplasm of unspecified site of right female breast: Secondary | ICD-10-CM

## 2022-02-09 ENCOUNTER — Inpatient Hospital Stay (HOSPITAL_BASED_OUTPATIENT_CLINIC_OR_DEPARTMENT_OTHER): Payer: Medicare Other | Admitting: Adult Health

## 2022-02-09 ENCOUNTER — Other Ambulatory Visit: Payer: Self-pay

## 2022-02-09 ENCOUNTER — Inpatient Hospital Stay: Payer: Medicare Other

## 2022-02-09 ENCOUNTER — Inpatient Hospital Stay: Payer: Medicare Other | Attending: Internal Medicine

## 2022-02-09 ENCOUNTER — Encounter: Payer: Self-pay | Admitting: Adult Health

## 2022-02-09 ENCOUNTER — Other Ambulatory Visit: Payer: Self-pay | Admitting: Hematology

## 2022-02-09 VITALS — BP 123/82 | HR 70 | Temp 97.8°F | Resp 16 | Ht 66.0 in | Wt 310.7 lb

## 2022-02-09 DIAGNOSIS — C50911 Malignant neoplasm of unspecified site of right female breast: Secondary | ICD-10-CM | POA: Diagnosis present

## 2022-02-09 DIAGNOSIS — Z5112 Encounter for antineoplastic immunotherapy: Secondary | ICD-10-CM | POA: Diagnosis present

## 2022-02-09 DIAGNOSIS — C7931 Secondary malignant neoplasm of brain: Secondary | ICD-10-CM

## 2022-02-09 DIAGNOSIS — Z171 Estrogen receptor negative status [ER-]: Secondary | ICD-10-CM | POA: Diagnosis not present

## 2022-02-09 DIAGNOSIS — Z923 Personal history of irradiation: Secondary | ICD-10-CM | POA: Diagnosis not present

## 2022-02-09 DIAGNOSIS — Z9013 Acquired absence of bilateral breasts and nipples: Secondary | ICD-10-CM | POA: Insufficient documentation

## 2022-02-09 DIAGNOSIS — Z95828 Presence of other vascular implants and grafts: Secondary | ICD-10-CM

## 2022-02-09 LAB — CMP (CANCER CENTER ONLY)
ALT: 9 U/L (ref 0–44)
AST: 12 U/L — ABNORMAL LOW (ref 15–41)
Albumin: 3.7 g/dL (ref 3.5–5.0)
Alkaline Phosphatase: 64 U/L (ref 38–126)
Anion gap: 3 — ABNORMAL LOW (ref 5–15)
BUN: 18 mg/dL (ref 6–20)
CO2: 31 mmol/L (ref 22–32)
Calcium: 8.8 mg/dL — ABNORMAL LOW (ref 8.9–10.3)
Chloride: 106 mmol/L (ref 98–111)
Creatinine: 0.92 mg/dL (ref 0.44–1.00)
GFR, Estimated: >60 mL/min (ref >60)
Glucose, Bld: 85 mg/dL (ref 70–99)
Potassium: 3.9 mmol/L (ref 3.5–5.1)
Sodium: 140 mmol/L (ref 135–145)
Total Bilirubin: 0.5 mg/dL (ref 0.3–1.2)
Total Protein: 7 g/dL (ref 6.5–8.1)

## 2022-02-09 LAB — CBC WITH DIFFERENTIAL (CANCER CENTER ONLY)
Abs Immature Granulocytes: 0.01 10*3/uL (ref 0.00–0.07)
Basophils Absolute: 0 10*3/uL (ref 0.0–0.1)
Basophils Relative: 1 %
Eosinophils Absolute: 0.1 10*3/uL (ref 0.0–0.5)
Eosinophils Relative: 4 %
HCT: 31.5 % — ABNORMAL LOW (ref 36.0–46.0)
Hemoglobin: 10.1 g/dL — ABNORMAL LOW (ref 12.0–15.0)
Immature Granulocytes: 0 %
Lymphocytes Relative: 26 %
Lymphs Abs: 0.7 10*3/uL (ref 0.7–4.0)
MCH: 29.6 pg (ref 26.0–34.0)
MCHC: 32.1 g/dL (ref 30.0–36.0)
MCV: 92.4 fL (ref 80.0–100.0)
Monocytes Absolute: 0.5 10*3/uL (ref 0.1–1.0)
Monocytes Relative: 17 %
Neutro Abs: 1.5 10*3/uL — ABNORMAL LOW (ref 1.7–7.7)
Neutrophils Relative %: 52 %
Platelet Count: 170 10*3/uL (ref 150–400)
RBC: 3.41 MIL/uL — ABNORMAL LOW (ref 3.87–5.11)
RDW: 13.9 % (ref 11.5–15.5)
WBC Count: 2.8 10*3/uL — ABNORMAL LOW (ref 4.0–10.5)
nRBC: 0 % (ref 0.0–0.2)

## 2022-02-09 MED ORDER — DIPHENHYDRAMINE HCL 25 MG PO CAPS
50.0000 mg | ORAL_CAPSULE | Freq: Once | ORAL | Status: AC
  Administered 2022-02-09: 50 mg via ORAL
  Filled 2022-02-09: qty 2

## 2022-02-09 MED ORDER — HEPARIN SOD (PORK) LOCK FLUSH 100 UNIT/ML IV SOLN
500.0000 [IU] | Freq: Once | INTRAVENOUS | Status: AC | PRN
  Administered 2022-02-09: 500 [IU]

## 2022-02-09 MED ORDER — ACETAMINOPHEN 325 MG PO TABS
650.0000 mg | ORAL_TABLET | Freq: Once | ORAL | Status: AC
  Administered 2022-02-09: 650 mg via ORAL
  Filled 2022-02-09: qty 2

## 2022-02-09 MED ORDER — SODIUM CHLORIDE 0.9% FLUSH
10.0000 mL | INTRAVENOUS | Status: DC | PRN
  Administered 2022-02-09: 10 mL

## 2022-02-09 MED ORDER — SODIUM CHLORIDE 0.9 % IV SOLN: Freq: Once | INTRAVENOUS | Status: AC

## 2022-02-09 MED ORDER — TRASTUZUMAB-DKST CHEMO 150 MG IV SOLR
6.0000 mg/kg | Freq: Once | INTRAVENOUS | Status: AC
  Administered 2022-02-09: 777 mg via INTRAVENOUS
  Filled 2022-02-09: qty 37

## 2022-02-09 NOTE — Assessment & Plan Note (Signed)
Monica Mccoy asked is here for follow-up of her metastatic breast cancer.  She continues on Herceptin given every 4 weeks.  She tolerates this well and will proceed with this today.  She undergoes echocardiograms every 6 months and this is scheduled again in July of this year.  For her feet I recommended she take the gabapentin as prescribed.  They were unaware of the diclofenac that was prescribed at discharge so I reviewed this with them in detail and they are going to check with her pharmacy to see if they have this on file for them to pick up.  In addition I recommended that she wear compression stockings or socks to help with some of the fluid and pulling that can happen in her feet.  During the day she refuses to elevate her feet which could help alleviate some of the pain and swelling.  Monica Mccoy asked will return in 4 weeks for labs, follow-up, and her next infusion.  She will see Dr. Mickeal Skinner soon as well regarding her brain metastases.  She has continued on her other medications after discharge without difficulty.

## 2022-02-09 NOTE — Progress Notes (Signed)
[facility] Follow up:    Monica Mccoy, [place] [address]   DIAGNOSIS:  Cancer Staging  Cancer of right breast metastatic to brain [facility]) Staging form: Breast, AJCC 8th Edition - Pathologic: Stage IV (pM1) - Signed by Gardenia Phlegm, NP on 02/09/2022   SUMMARY OF ONCOLOGIC HISTORY: Oncology History  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[place])  06/13/2012 Initial Diagnosis   Right breast biopsy: Invasive ductal carcinoma, grade 3, HER-2 positive (3+), ER/PR negative, Ki67 5-10%, and in the right breast, high grade DCIS. BRCA gene testing was negative   08/06/2012 Surgery   Bilateral mastectomies:  Right breast: Invasive ductal carcinoma, 0.7cm, grade 2, with extensive DCIS, clear margins, 8/11 lymph nodes positive, measuring up to 3.8cm,  Left breast, no evidence of malignancy and 6 lymph nodes negative for carcinoma.    2014 - 2015 Chemotherapy   adjuvant chemotherapy with 4 cycles of dose dense Adriamycin and Cytoxan followed by 4 cycles of Taxol Herceptin and then Herceptin maintenance. She underwent right chest radiation.       12/24/2013 Relapse/Recurrence   Dizziness, headaches, ataxia, and memory loss. Head CT showed a 1.8cm and a 2.0cm frontal lobe masses. She underwent resection of both masses on 01/07/14,: Moderately differentiated carcinoma, likely of breast origin, HER-2 positive, ER/PR negative, CK7 positive.   2015 - 2015 Radiation Therapy   Received whole brain radiation    Chemotherapy   Taxotere, Herceptin and Perjeta, but Taxotere was discontinued after 4 cycles       11/21/2020 -  Chemotherapy   Patient is on Treatment Plan : BREAST Trastuzumab q21d     02/09/2022 Cancer Staging   Staging form: Breast, AJCC 8th Edition - Pathologic: Stage IV (pM1) - Signed by Gardenia Phlegm, NP on 02/09/2022     CURRENT THERAPY: Herceptin  INTERVAL HISTORY: Monica Mccoy 56 y.o. female  returns for follow-up prior to receiving trastuzumab which she gets every 28 days.  She tolerates therapy relatively well and we have transition to conducting echocardiograms on her every 6 months instead of every 3 months per Dr. Lindi Adie.  Her most recent echocardiogram was completed on September 14, 2021 showing a left ventricular ejection fraction of 55 to 60% with a global longitudinal strain that was normal.  Her next echo is scheduled for July third 2023  Zoriyah was recently hospitalized from June 4 to February 02, 2022.  She was hospitalized due to confusion and was found to have partial seizures in the setting of prior craniotomy for metastatic breast cancer.  In regards to her confusion she was encephalopathic however the etiology was not confirmed but questionably secondary to [facility].  B1 was replaced as well as oral thiamine, magnesium, and potassium.  She was confused about what medication she should be taking after discharge and her biggest issue has been the pain in her feet.  She has not had any further confusion since being discharged.  She is doing better since then.     Patient Active Problem List   Diagnosis Date Noted   Metabolic encephalopathy 91/63/8466   Cholelithiasis 10/16/2021   Uterine fibroid 10/16/2021   Aortic atherosclerosis ([place]) 10/16/2021   Chemotherapy-induced neuropathy ([place]) 09/11/2021   Localization-related (focal) (partial) symptomatic epilepsy and epileptic syndromes with complex partial seizures, not intractable, without status epilepticus ([place]) 09/11/2021   Essential hypertension 05/30/2021   Acute encephalopathy 05/19/2021   Port-A-Cath in place 01/16/2021   Migraine without aura  12/19/2020   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[place]) 09/22/2020   Gastroesophageal reflux disease without esophagitis 06/29/2020   Other chronic pain 06/29/2020   Moderate episode of recurrent major depressive disorder ([place]) 06/29/2020   Urinary incontinence 06/29/2020     is allergic to dilaudid [hydromorphone hcl], morphine and related, penicillins, and tramadol.  MEDICAL HISTORY: Past Medical History:  Diagnosis Date   Anxiety    Breast cancer metastasized to brain [facility])    Left breast   Chronic pain after cancer treatment    Depression    GERD (gastroesophageal reflux disease)    History of cancer chemotherapy    Breast cancer left breast   Seizure ([place])     SURGICAL HISTORY: Past Surgical History:  Procedure Laterality Date   BRAIN SURGERY     brain tumor   MASTECTOMY Bilateral    ORIF ANKLE FRACTURE Right     SOCIAL HISTORY: Social History   Socioeconomic History   Marital status: Divorced    Spouse name: Not on file   Number of children: 3   Years of education: Not on file   Highest education level: Not on file  Occupational History   Occupation: Disabled  Tobacco Use   Smoking status: Never   Smokeless tobacco: Never  Vaping Use   Vaping Use: Never used  Substance and Sexual Activity   Alcohol use: Yes    Comment: On occasion   Drug use: Yes    Types: Marijuana    Comment: Occasional use   Sexual activity: Not Currently  Other Topics Concern   Not on file  Social History Narrative   Right handed   Drinks caffeine   Two story home   Social Determinants of Health   Financial Resource Strain: Medium Risk (08/30/2020)   Overall Financial Resource Strain (CARDIA)    Difficulty of Paying Living Expenses: Somewhat hard  Food Insecurity: Food Insecurity Present (08/30/2020)   Hunger Vital Sign    Worried About Running Out of Food in the Last Year: Sometimes true    Ran Out of Food in the Last Year: Never true  Transportation Needs: No Transportation Needs (08/30/2020)   PRAPARE - Hydrologist (Medical): No    Lack of Transportation (Non-Medical): No  Physical Activity: Insufficiently Active (08/30/2020)   Exercise Vital Sign    Days of Exercise per Week: 1 day    Minutes of Exercise per  Session: 30 min  Stress: No Stress Concern Present (08/30/2020)   [place]    Feeling of Stress : Only a little  Social Connections: Socially Isolated (08/30/2020)   Social Connection and Isolation Panel [NHANES]    Frequency of Communication with Friends and Family: More than three times a week    Frequency of Social Gatherings with Friends and Family: Once a week    Attends Religious Services: Never    Marine scientist or Organizations: No    Attends Archivist Meetings: Never    Marital Status: Never married  Intimate Partner Violence: Not At Risk (08/30/2020)   Humiliation, Afraid, Rape, and Kick questionnaire    Fear of Current or Ex-Partner: No    Emotionally Abused: No    Physically Abused: No    Sexually Abused: No    FAMILY HISTORY: Family History  Problem Relation Age of Onset   Obesity Mother    Hypertension Mother    Hyperlipidemia Mother  Diabetes Mother    Heart disease Mother     Review of Systems  Constitutional:  Positive for fatigue. Negative for appetite change, chills, fever and unexpected weight change.  HENT:   Negative for hearing loss, lump/mass and trouble swallowing.   Eyes:  Negative for eye problems and icterus.  Respiratory:  Negative for chest tightness, cough and shortness of breath.   Cardiovascular:  Positive for leg swelling (Mild). Negative for chest pain and palpitations.  Gastrointestinal:  Negative for abdominal distention, abdominal pain, constipation, diarrhea, nausea and vomiting.  Endocrine: Negative for hot flashes.  Genitourinary:  Negative for difficulty urinating.   Musculoskeletal:  Negative for arthralgias.  Skin:  Negative for itching and rash.  Neurological:  Positive for numbness. Negative for dizziness, extremity weakness and headaches.  Hematological:  Negative for adenopathy. Does not bruise/bleed easily.  Psychiatric/Behavioral:  Negative  for depression. The patient is not nervous/anxious.       PHYSICAL EXAMINATION  ECOG PERFORMANCE STATUS: 2 - Symptomatic, <50% confined to bed  Vitals:   02/09/22 1158  BP: 123/82  Pulse: 70  Resp: 16  Temp: 97.8 F (36.6 C)  SpO2: 98%    Physical Exam Constitutional:      General: She is not in acute distress.    Appearance: Normal appearance. She is not toxic-appearing.  HENT:     Head: Normocephalic and atraumatic.  Eyes:     General: No scleral icterus. Cardiovascular:     Rate and Rhythm: Normal rate and regular rhythm.     Pulses: Normal pulses.     Heart sounds: Normal heart sounds.  Pulmonary:     Effort: Pulmonary effort is normal.     Breath sounds: Normal breath sounds.  Abdominal:     General: Abdomen is flat. Bowel sounds are normal. There is no distension.     Palpations: Abdomen is soft.     Tenderness: There is no abdominal tenderness.  Musculoskeletal:        General: No swelling.     Cervical back: Neck supple.  Lymphadenopathy:     Cervical: No cervical adenopathy.  Skin:    General: Skin is warm and dry.     Findings: No rash.  Neurological:     General: No focal deficit present.     Mental Status: She is alert.  Psychiatric:        Mood and Affect: Mood normal.        Behavior: Behavior normal.     LABORATORY DATA:  CBC    Component Value Date/Time   WBC 2.8 (L) 02/09/2022 1134   WBC 2.6 (L) 02/02/2022 1226   RBC 3.41 (L) 02/09/2022 1134   HGB 10.1 (L) 02/09/2022 1134   HCT 31.5 (L) 02/09/2022 1134   PLT 170 02/09/2022 1134   MCV 92.4 02/09/2022 1134   MCH 29.6 02/09/2022 1134   MCHC 32.1 02/09/2022 1134   RDW 13.9 02/09/2022 1134   LYMPHSABS 0.7 02/09/2022 1134   MONOABS 0.5 02/09/2022 1134   EOSABS 0.1 02/09/2022 1134   BASOSABS 0.0 02/09/2022 1134    CMP     Component Value Date/Time   NA 140 02/09/2022 1134   NA 139 12/15/2019 0000   K 3.9 02/09/2022 1134   CL 106 02/09/2022 1134   CO2 31 02/09/2022 1134    GLUCOSE 85 02/09/2022 1134   BUN 18 02/09/2022 1134   BUN 10 12/15/2019 0000   CREATININE 0.92 02/09/2022 1134   CALCIUM 8.8 (L) 02/09/2022  1134   PROT 7.0 02/09/2022 1134   ALBUMIN 3.7 02/09/2022 1134   AST 12 (L) 02/09/2022 1134   ALT 9 02/09/2022 1134   ALKPHOS 64 02/09/2022 1134   BILITOT 0.5 02/09/2022 1134   GFRNONAA >60 02/09/2022 1134      ASSESSMENT and THERAPY PLAN:   Cancer of right breast metastatic to brain [facility]) Monica Mccoy asked is here for follow-up of her metastatic breast cancer.  She continues on Herceptin given every 4 weeks.  She tolerates this well and will proceed with this today.  She undergoes echocardiograms every 6 months and this is scheduled again in July of this year.  For her feet I recommended she take the gabapentin as prescribed.  They were unaware of the diclofenac that was prescribed at discharge so I reviewed this with them in detail and they are going to check with her pharmacy to see if they have this on file for them to pick up.  In addition I recommended that she wear compression stockings or socks to help with some of the fluid and pulling that can happen in her feet.  During the day she refuses to elevate her feet which could help alleviate some of the pain and swelling.  Monica Mccoy asked will return in 4 weeks for labs, follow-up, and her next infusion.  She will see Dr. Mickeal Skinner soon as well regarding her brain metastases.  She has continued on her other medications after discharge without difficulty.    All questions were answered. The patient knows to call the clinic with any problems, questions or concerns. We can certainly see the patient much sooner if necessary.   Total encounter time:30 minutes*in face-to-face visit time, chart review, lab review, care coordination, order entry, and documentation of the encounter time.    Wilber Bihari, NP 02/09/22 12:43 PM Medical Oncology and Hematology [facility] [place], [place] [address] Tel. [phone]    Fax. [phone]  *Total Encounter Time as defined by the Centers for Medicare and Medicaid Services includes, in addition to the face-to-face time of a patient visit (documented in the note above) non-face-to-face time: obtaining and reviewing outside history, ordering and reviewing medications, tests or procedures, care coordination (communications with other health care professionals or caregivers) and documentation in the medical record.

## 2022-02-09 NOTE — Patient Instructions (Signed)
[facility] MEDICAL ONCOLOGY   Discharge Instructions: Thank you for choosing [facility] to provide your oncology and hematology care.   If you have a lab appointment with the Cancer Center, please go directly to the Cancer Center and check in at the registration area.   Wear comfortable clothing and clothing appropriate for easy access to any Portacath or PICC line.   We strive to give you quality time with your provider. You may need to reschedule your appointment if you arrive late (15 or more minutes).  Arriving late affects you and other patients whose appointments are after yours.  Also, if you miss three or more appointments without notifying the office, you may be dismissed from the clinic at the provider's discretion.      For prescription refill requests, have your pharmacy contact our office and allow 72 hours for refills to be completed.    Today you received the following chemotherapy and/or immunotherapy agents: [id number]      To help prevent nausea and vomiting after your treatment, we encourage you to take your nausea medication as directed.  BELOW ARE SYMPTOMS THAT SHOULD BE REPORTED IMMEDIATELY: *FEVER GREATER THAN 100.4 F (38 C) OR HIGHER *CHILLS OR SWEATING *NAUSEA AND VOMITING THAT IS NOT CONTROLLED WITH YOUR NAUSEA MEDICATION *UNUSUAL SHORTNESS OF BREATH *UNUSUAL BRUISING OR BLEEDING *URINARY PROBLEMS (pain or burning when urinating, or frequent urination) *BOWEL PROBLEMS (unusual diarrhea, constipation, pain near the anus) TENDERNESS IN MOUTH AND THROAT WITH OR WITHOUT PRESENCE OF ULCERS (sore throat, sores in mouth, or a toothache) UNUSUAL RASH, SWELLING OR PAIN  UNUSUAL VAGINAL DISCHARGE OR ITCHING   Items with * indicate a potential emergency and should be followed up as soon as possible or go to the Emergency Department if any problems should occur.  Please show the CHEMOTHERAPY ALERT CARD or IMMUNOTHERAPY ALERT CARD at  check-in to the Emergency Department and triage nurse.  Should you have questions after your visit or need to cancel or reschedule your appointment, please contact [facility] MEDICAL ONCOLOGY  Dept: [phone]  and follow the prompts.  Office hours are 8:00 a.m. to 4:30 p.m. Monday - Friday. Please note that voicemails left after 4:00 p.m. may not be returned until the following business day.  We are closed weekends and major holidays. You have access to a nurse at all times for urgent questions. Please call the main number to the clinic Dept: [phone] and follow the prompts.   For any non-urgent questions, you may also contact your provider using MyChart. We now offer e-Visits for anyone 18 and older to request care online for non-urgent symptoms. For details visit [email or web address].   Also download the MyChart app! Go to the app store, search "MyChart", open the app, select [facility], and log in with your MyChart username and password.  Masks are optional in the cancer centers. If you would like for your care team to wear a mask while they are taking care of you, please let them know. For doctor visits, patients may have with them one support person who is at least 55 years old. At this time, visitors are not allowed in the infusion area. 

## 2022-02-12 ENCOUNTER — Ambulatory Visit (INDEPENDENT_AMBULATORY_CARE_PROVIDER_SITE_OTHER): Payer: Medicare Other | Admitting: Family Medicine

## 2022-02-12 VITALS — BP 134/82 | HR 76 | Temp 96.9°F | Ht 66.0 in | Wt 316.8 lb

## 2022-02-12 DIAGNOSIS — G40209 Localization-related (focal) (partial) symptomatic epilepsy and epileptic syndromes with complex partial seizures, not intractable, without status epilepticus: Secondary | ICD-10-CM

## 2022-02-12 DIAGNOSIS — R600 Localized edema: Secondary | ICD-10-CM

## 2022-02-12 DIAGNOSIS — C50911 Malignant neoplasm of unspecified site of right female breast: Secondary | ICD-10-CM

## 2022-02-12 DIAGNOSIS — C7931 Secondary malignant neoplasm of brain: Secondary | ICD-10-CM

## 2022-02-12 DIAGNOSIS — I10 Essential (primary) hypertension: Secondary | ICD-10-CM | POA: Diagnosis not present

## 2022-02-12 DIAGNOSIS — G9341 Metabolic encephalopathy: Secondary | ICD-10-CM | POA: Diagnosis not present

## 2022-02-12 LAB — CBC
HCT: 35.1 % — ABNORMAL LOW (ref 36.0–46.0)
Hemoglobin: 11.5 g/dL — ABNORMAL LOW (ref 12.0–15.0)
MCHC: 32.8 g/dL (ref 30.0–36.0)
MCV: 90.7 fl (ref 78.0–100.0)
Platelets: 190 10*3/uL (ref 150.0–400.0)
RBC: 3.87 Mil/uL (ref 3.87–5.11)
RDW: 14.8 % (ref 11.5–15.5)
WBC: 3.7 10*3/uL — ABNORMAL LOW (ref 4.0–10.5)

## 2022-02-12 LAB — COMPREHENSIVE METABOLIC PANEL
ALT: 13 U/L (ref 0–35)
AST: 16 U/L (ref 0–37)
Albumin: 3.9 g/dL (ref 3.5–5.2)
Alkaline Phosphatase: 73 U/L (ref 39–117)
BUN: 13 mg/dL (ref 6–23)
CO2: 30 mEq/L (ref 19–32)
Calcium: 9.3 mg/dL (ref 8.4–10.5)
Chloride: 105 mEq/L (ref 96–112)
Creatinine, Ser: 0.96 mg/dL (ref 0.40–1.20)
GFR: 66.64 mL/min (ref >60.00)
Glucose, Bld: 71 mg/dL (ref 70–99)
Potassium: 3.7 mEq/L (ref 3.5–5.1)
Sodium: 141 mEq/L (ref 135–145)
Total Bilirubin: 0.6 mg/dL (ref 0.2–1.2)
Total Protein: 7.6 g/dL (ref 6.0–8.3)

## 2022-02-12 LAB — MAGNESIUM: Magnesium: 1.9 mg/dL (ref 1.5–2.5)

## 2022-02-12 MED ORDER — FUROSEMIDE 20 MG PO TABS: 20.0000 mg | ORAL_TABLET | Freq: Every day | ORAL | 3 refills | Status: DC

## 2022-02-12 NOTE — Progress Notes (Signed)
[facility] [address] [place] [address] Dept: [phone] Dept Fax: [phone]  Chronic Care Office Visit  Subjective:    Patient ID: Monica Mccoy, female    DOB: 07/16/1966, 56 y.o..   MRN: [id number]  Chief Complaint  Patient presents with   Hospitalization [facility] f/u.     History of Present Illness:  Patient is in today for hospital follow-up. [name] was admitted at [facility] from 6/4-02/02/2022. She had developed increased weakness and confusion. Monica Mccoy has a history of breast cancer that is metastatic to the brain. She had prior craniotomy and remains on Herceptin for management. Durign her hospitalization, she was noted to have partial seizures. She is now on Depakote 250 mg bid and gabapentin 300 mg bid with 600 mg at bedtime. During the hospitalization, her tramadol was stopped out of concern that it might lower her seizure threshold. She is now on diclofenac for foot pain.  Monica Mccoy has a history of lower leg edema, but no evidence of CHF. She was previously on Lasix, but this was held at discharge. Her swelling has been significant.  She is not consistent in elevating her legs. The family is still working to obtain compression stockings. Her sister notes Monica Mccoy does not sleep much at night, which is disruptive tot he household. She does nap off/on during the day.  Past Medical History: Patient Active Problem List   Diagnosis Date Noted   Metabolic encephalopathy 50/53/9767   Cholelithiasis 10/16/2021   Uterine fibroid 10/16/2021   Aortic atherosclerosis ([place]) 10/16/2021   Chemotherapy-induced neuropathy ([place]) 09/11/2021   Localization-related (focal) (partial) symptomatic epilepsy and epileptic syndromes with complex partial seizures, not intractable, without status epilepticus ([place]) 09/11/2021   Essential hypertension 05/30/2021   Acute encephalopathy 05/19/2021   Port-A-Cath in place  01/16/2021   Migraine without aura 12/19/2020   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[place]) 09/22/2020   Gastroesophageal reflux disease without esophagitis 06/29/2020   Other chronic pain 06/29/2020   Moderate episode of recurrent major depressive disorder ([place]) 06/29/2020   Urinary incontinence 06/29/2020   Past Surgical History:  Procedure Laterality Date   BRAIN SURGERY     brain tumor   MASTECTOMY Bilateral    ORIF ANKLE FRACTURE Right    Family History  Problem Relation Age of Onset   Obesity Mother    Hypertension Mother    Hyperlipidemia Mother    Diabetes Mother    Heart disease Mother    Outpatient Medications Prior to Visit  Medication Sig Dispense Refill   amLODipine (NORVASC) 5 MG tablet TAKE 1 TABLET (5 MG TOTAL) BY MOUTH DAILY. 90 tablet 3   diclofenac (VOLTAREN) 50 MG EC tablet Take 1 tablet (50 mg total) by mouth 2 (two) times daily with a meal. Make sure eats or takes something before using this medicine 30 tablet 1   divalproex (DEPAKOTE) 250 MG DR tablet TAKE 1 TABLET BY MOUTH TWICE A DAY (Patient taking differently: Take 250 mg by mouth 2 (two) times daily.) 60 tablet 1   gabapentin (NEURONTIN) 300 MG capsule Take 1 capsule (300 mg total) by mouth 2 (two) times daily between meals. 60 capsule 2   gabapentin (NEURONTIN) 300 MG capsule Take 2 capsules (600 mg total) by mouth at bedtime. 60 capsule 0   MELATONIN GUMMIES PO Take 10 mg by mouth.     mirabegron ER (MYRBETRIQ) 50 MG TB24 tablet Take 1 tablet (50  mg total) by mouth daily. 90 tablet 1   pantoprazole (PROTONIX) 40 MG tablet Take 1 tablet (40 mg total) by mouth daily. 30 tablet 0   thiamine 100 MG tablet Take 1 tablet (100 mg total) by mouth daily. 30 tablet 11   No facility-administered medications prior to visit.   Allergies  Allergen Reactions   Dilaudid [Hydromorphone Hcl] Other (See Comments)    Confusion & hallucinations   Morphine And Related Other (See Comments)    HA   Penicillins     Tramadol      Objective:   Today's Vitals   02/12/22 1134  BP: 134/82  Pulse: 76  Temp: (!) 96.9 F (36.1 C)  TempSrc: Temporal  SpO2: 96%  Weight: (!) 316 lb 12.8 oz (143.7 kg)  Height: '5\' 6"'$  (1.676 m)   Body mass index is 51.13 kg/m.   General: Well developed, well nourished. No acute distress. Extremities: 3-4+ edema of both lower legs. Psych: Alert and oriented. Normal mood and affect.  Health Maintenance Due  Topic Date Due   URINE MICROALBUMIN  Never done   Hepatitis C Screening  Never done   TETANUS/TDAP  Never done   Zoster Vaccines- Shingrix (1 of 2) Never done   PAP SMEAR-Modifier  Never done   COLONOSCOPY (Pts 45-81yr Insurance coverage will need to be confirmed)  Never done   MAMMOGRAM  Never done     Assessment & Plan:   1. Metabolic encephalopathy The discharge physician recommended follow up labs to be performed today. Although her confusion is improved, at baseline, Ms. MClayton Lefortdoes till have some mild dementia symptoms. I encouraged that she avoid naps to try and get better sleep at night.  - Comprehensive metabolic panel - CBC - Magnesium  2. Localization-related (focal) (partial) symptomatic epilepsy and epileptic syndromes with complex partial seizures, not intractable, without status epilepticus ([place] On Depakote and gabapentin. She has a follow-up appointment with Dr. ADelice Leschon 7/12.  - Comprehensive metabolic panel - CBC - Magnesium  3. 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[facility] Continue to follow with Dr. GReita Chard(oncology).  4. Essential hypertension Blood pressure is mildly high today. She will cotinue amlodipine 5 mg daily.  5. Pedal edema Monica Mccoy's chronic pedal edema is worse off of her Lasix. Her weight is up 19 lbs since April. I will restart her on Lasix 20 mg daily.  - furosemide (LASIX) 20 MG tablet; Take 1 tablet (20 mg total) by mouth daily.  Dispense: 30 tablet; Refill: 3   Return in about 3 months (around  05/15/2022) for Reassessment.   SHaydee Salter MD

## 2022-02-13 ENCOUNTER — Encounter: Payer: Self-pay | Admitting: Family Medicine

## 2022-02-13 NOTE — Telephone Encounter (Signed)
No show history: 11/27/21 late cancel, waived fee 5/30 no show, charged feed  Final warning letter mailed out

## 2022-02-15 ENCOUNTER — Other Ambulatory Visit: Payer: Self-pay | Admitting: Nurse Practitioner

## 2022-02-16 ENCOUNTER — Telehealth: Payer: Self-pay | Admitting: Family Medicine

## 2022-02-19 NOTE — Telephone Encounter (Signed)
Lft detailed VM with okay to PT.  Dm/cma

## 2022-02-20 ENCOUNTER — Ambulatory Visit: Payer: Medicare Other

## 2022-02-21 ENCOUNTER — Telehealth: Payer: Self-pay | Admitting: Family Medicine

## 2022-02-21 NOTE — Telephone Encounter (Signed)
Spoke to Will W/[facility]. He reports patient is sleeping in a sitting position with feet down.  He did councilor her on this today.     On the 20th her weight was 310 Today 6/28 her weight is 324.6 Her feet are swollen and painful. She has a headache. Her pain level is 8 out of 10.   Please review and advise.  Thanks. Dm/cma

## 2022-02-21 NOTE — Telephone Encounter (Signed)
Patient's sister, [name], she will make sure that patient is taking her fluid pill and will call if appointment needed.  Dm/cma

## 2022-02-21 NOTE — Telephone Encounter (Signed)
Will from [facility] [phone].  Leaving vital sign for pt. He was told to monitor weight.  On the 20th her weight was 310 Today 6/28 her weight is 324.6 Her feet are swollen and painful. She has a headache. Her pain level is 8 out of 10. Please call # above.

## 2022-02-21 NOTE — Telephone Encounter (Signed)
Will from [place], he is needing verbal orders for OT 1w/1 and 2w/3. His phone line is secure at  [phone].

## 2022-02-22 NOTE — Telephone Encounter (Signed)
Called Will with [facility] and gave verbal okay. Dm/cma

## 2022-02-26 ENCOUNTER — Ambulatory Visit (HOSPITAL_COMMUNITY)
Admission: RE | Admit: 2022-02-26 | Discharge: 2022-02-26 | Disposition: A | Discharge status: Home / Self Care | Payer: Medicare Other | Source: Ambulatory Visit | Attending: Adult Health | Admitting: Adult Health

## 2022-02-26 DIAGNOSIS — Z5111 Encounter for antineoplastic chemotherapy: Secondary | ICD-10-CM | POA: Diagnosis not present

## 2022-02-26 DIAGNOSIS — I10 Essential (primary) hypertension: Secondary | ICD-10-CM | POA: Insufficient documentation

## 2022-02-26 DIAGNOSIS — Z0189 Encounter for other specified special examinations: Secondary | ICD-10-CM | POA: Diagnosis not present

## 2022-02-26 DIAGNOSIS — Z5181 Encounter for therapeutic drug level monitoring: Secondary | ICD-10-CM | POA: Diagnosis not present

## 2022-02-26 DIAGNOSIS — T451X5S Adverse effect of antineoplastic and immunosuppressive drugs, sequela: Secondary | ICD-10-CM

## 2022-02-26 LAB — ECHOCARDIOGRAM COMPLETE
AR max vel: 2.65 cm2
AV Peak grad: 6.5 mmHg
Ao pk vel: 1.27 m/s
Area-P 1/2: 4.8 cm2
Calc EF: 52.4 %
S' Lateral: 3.3 cm
Single Plane A2C EF: 53.2 %
Single Plane A4C EF: 51.5 %

## 2022-03-06 ENCOUNTER — Other Ambulatory Visit: Payer: Self-pay

## 2022-03-06 ENCOUNTER — Inpatient Hospital Stay: Payer: Medicare Other | Attending: Internal Medicine

## 2022-03-06 VITALS — BP 131/84 | HR 67 | Temp 97.6°F | Resp 16 | Wt 312.2 lb

## 2022-03-06 DIAGNOSIS — Z923 Personal history of irradiation: Secondary | ICD-10-CM | POA: Insufficient documentation

## 2022-03-06 DIAGNOSIS — Z5112 Encounter for antineoplastic immunotherapy: Secondary | ICD-10-CM | POA: Diagnosis not present

## 2022-03-06 DIAGNOSIS — C7931 Secondary malignant neoplasm of brain: Secondary | ICD-10-CM | POA: Insufficient documentation

## 2022-03-06 DIAGNOSIS — C50911 Malignant neoplasm of unspecified site of right female breast: Secondary | ICD-10-CM | POA: Diagnosis present

## 2022-03-06 DIAGNOSIS — Z171 Estrogen receptor negative status [ER-]: Secondary | ICD-10-CM | POA: Insufficient documentation

## 2022-03-06 MED ORDER — ACETAMINOPHEN 325 MG PO TABS
650.0000 mg | ORAL_TABLET | Freq: Once | ORAL | Status: AC
  Administered 2022-03-06: 650 mg via ORAL
  Filled 2022-03-06: qty 2

## 2022-03-06 MED ORDER — TRASTUZUMAB-DKST CHEMO 150 MG IV SOLR
6.0000 mg/kg | Freq: Once | INTRAVENOUS | Status: AC
  Administered 2022-03-06: 777 mg via INTRAVENOUS
  Filled 2022-03-06: qty 37

## 2022-03-06 MED ORDER — SODIUM CHLORIDE 0.9% FLUSH
10.0000 mL | INTRAVENOUS | Status: DC | PRN
  Administered 2022-03-06: 10 mL

## 2022-03-06 MED ORDER — HEPARIN SOD (PORK) LOCK FLUSH 100 UNIT/ML IV SOLN
500.0000 [IU] | Freq: Once | INTRAVENOUS | Status: AC | PRN
  Administered 2022-03-06: 500 [IU]

## 2022-03-06 MED ORDER — DIPHENHYDRAMINE HCL 25 MG PO CAPS
50.0000 mg | ORAL_CAPSULE | Freq: Once | ORAL | Status: AC
  Administered 2022-03-06: 50 mg via ORAL
  Filled 2022-03-06: qty 2

## 2022-03-06 MED ORDER — SODIUM CHLORIDE 0.9 % IV SOLN: Freq: Once | INTRAVENOUS | Status: AC

## 2022-03-06 NOTE — Patient Instructions (Signed)
[facility]   Discharge Instructions: Thank you for choosing [facility] to provide your oncology and hematology care.   If you have a lab appointment with the [facility], please go directly to the [facility] and check in at the registration area.   Wear comfortable clothing and clothing appropriate for easy access to any Portacath or PICC line.   We strive to give you quality time with your provider. You may need to reschedule your appointment if you arrive late (15 or more minutes).  Arriving late affects you and other patients whose appointments are after yours.  Also, if you miss three or more appointments without notifying the office, you may be dismissed from the clinic at the provider's discretion.      For prescription refill requests, have your pharmacy contact our office and allow 72 hours for refills to be completed.    Today you received the following chemotherapy and/or immunotherapy agents: [id number]      To help prevent nausea and vomiting after your treatment, we encourage you to take your nausea medication as directed.  BELOW ARE SYMPTOMS THAT SHOULD BE REPORTED IMMEDIATELY: *FEVER GREATER THAN 100.4 F (38 C) OR HIGHER *CHILLS OR SWEATING *NAUSEA AND VOMITING THAT IS NOT CONTROLLED WITH YOUR NAUSEA MEDICATION *UNUSUAL SHORTNESS OF BREATH *UNUSUAL BRUISING OR BLEEDING *URINARY PROBLEMS (pain or burning when urinating, or frequent urination) *BOWEL PROBLEMS (unusual diarrhea, constipation, pain near the anus) TENDERNESS IN MOUTH AND THROAT WITH OR WITHOUT PRESENCE OF ULCERS (sore throat, sores in mouth, or a toothache) UNUSUAL RASH, SWELLING OR PAIN  UNUSUAL VAGINAL DISCHARGE OR ITCHING   Items with * indicate a potential emergency and should be followed up as soon as possible or go to the Emergency Department if any problems should occur.  Please show the CHEMOTHERAPY ALERT CARD or IMMUNOTHERAPY ALERT CARD at  check-in to the Emergency Department and triage nurse.  Should you have questions after your visit or need to cancel or reschedule your appointment, please contact [facility]  Dept: [phone]  and follow the prompts.  Office hours are 8:00 a.m. to 4:30 p.m. Monday - Friday. Please note that voicemails left after 4:00 p.m. may not be returned until the following business day.  We are closed weekends and major holidays. You have access to a nurse at all times for urgent questions. Please call the main number to the clinic Dept: [phone] and follow the prompts.   For any non-urgent questions, you may also contact your provider using MyChart. We now offer e-Visits for anyone 44 and older to request care online for non-urgent symptoms. For details visit [email or web address].   Also download the MyChart app! Go to the app store, search "MyChart", open the app, select [facility], and log in with your MyChart username and password.  Masks are optional in the cancer centers. If you would like for your care team to wear a mask while they are taking care of you, please let them know. For doctor visits, patients may have with them one support person who is at least 55 years old. At this time, visitors are not allowed in the infusion area.

## 2022-03-07 ENCOUNTER — Telehealth: Payer: Self-pay | Admitting: Family Medicine

## 2022-03-07 ENCOUNTER — Other Ambulatory Visit: Payer: Self-pay | Admitting: Internal Medicine

## 2022-03-07 DIAGNOSIS — Z8669 Personal history of other diseases of the nervous system and sense organs: Secondary | ICD-10-CM

## 2022-03-07 NOTE — Telephone Encounter (Signed)
Tammy L from [facility] is wanting to give Dr Gena Fray an update.  She has mild burning when she urinates, and she has an small open wound on her R buttocks. If you have any further questions Tammy's # is [phone]. She can not take orders or change plan of care. She did give this # if you need to do any of these [phone].

## 2022-03-07 NOTE — Telephone Encounter (Signed)
Alberteen Sam, sister and scheduled an appt for 03/09/22@ 1:20 pm. Dm/cma

## 2022-03-08 ENCOUNTER — Encounter: Payer: Self-pay | Admitting: Hematology and Oncology

## 2022-03-09 ENCOUNTER — Ambulatory Visit (INDEPENDENT_AMBULATORY_CARE_PROVIDER_SITE_OTHER): Payer: Medicare Other | Admitting: Family Medicine

## 2022-03-09 ENCOUNTER — Telehealth (INDEPENDENT_AMBULATORY_CARE_PROVIDER_SITE_OTHER): Payer: Medicare Other | Admitting: Neurology

## 2022-03-09 ENCOUNTER — Encounter: Payer: Self-pay | Admitting: Neurology

## 2022-03-09 VITALS — BP 132/84 | Temp 97.8°F | Wt 316.0 lb

## 2022-03-09 VITALS — Ht 65.5 in | Wt 316.0 lb

## 2022-03-09 DIAGNOSIS — L89312 Pressure ulcer of right buttock, stage 2: Secondary | ICD-10-CM | POA: Diagnosis not present

## 2022-03-09 DIAGNOSIS — G40209 Localization-related (focal) (partial) symptomatic epilepsy and epileptic syndromes with complex partial seizures, not intractable, without status epilepticus: Secondary | ICD-10-CM

## 2022-03-09 DIAGNOSIS — C50911 Malignant neoplasm of unspecified site of right female breast: Secondary | ICD-10-CM | POA: Diagnosis not present

## 2022-03-09 DIAGNOSIS — N3000 Acute cystitis without hematuria: Secondary | ICD-10-CM

## 2022-03-09 DIAGNOSIS — G8929 Other chronic pain: Secondary | ICD-10-CM

## 2022-03-09 DIAGNOSIS — G43009 Migraine without aura, not intractable, without status migrainosus: Secondary | ICD-10-CM | POA: Diagnosis not present

## 2022-03-09 DIAGNOSIS — K219 Gastro-esophageal reflux disease without esophagitis: Secondary | ICD-10-CM

## 2022-03-09 DIAGNOSIS — R32 Unspecified urinary incontinence: Secondary | ICD-10-CM

## 2022-03-09 DIAGNOSIS — C7931 Secondary malignant neoplasm of brain: Secondary | ICD-10-CM

## 2022-03-09 LAB — POCT URINALYSIS DIPSTICK
Bilirubin, UA: NEGATIVE
Blood, UA: NEGATIVE
Glucose, UA: NEGATIVE
Ketones, UA: NEGATIVE
Nitrite, UA: POSITIVE
Protein, UA: NEGATIVE
Spec Grav, UA: 1.01 (ref 1.010–1.025)
Urobilinogen, UA: 0.2 E.U./dL
pH, UA: 8 (ref 5.0–8.0)

## 2022-03-09 MED ORDER — PANTOPRAZOLE SODIUM 40 MG PO TBEC: 40.0000 mg | DELAYED_RELEASE_TABLET | Freq: Every day | ORAL | 0 refills | Status: DC

## 2022-03-09 MED ORDER — SULFAMETHOXAZOLE-TRIMETHOPRIM 800-160 MG PO TABS: 1.0000 | ORAL_TABLET | Freq: Two times a day (BID) | ORAL | 0 refills | Status: DC

## 2022-03-09 MED ORDER — MIRABEGRON ER 50 MG PO TB24: 50.0000 mg | ORAL_TABLET | Freq: Every day | ORAL | 1 refills | Status: DC

## 2022-03-09 MED ORDER — DICLOFENAC SODIUM 50 MG PO TBEC: 50.0000 mg | DELAYED_RELEASE_TABLET | Freq: Two times a day (BID) | ORAL | 1 refills | Status: DC

## 2022-03-09 MED ORDER — GABAPENTIN 300 MG PO CAPS: ORAL_CAPSULE | ORAL | 3 refills | Status: DC
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
## 2022-03-09 NOTE — Progress Notes (Signed)
[facility] [address] [place] [address] Dept: [phone] Dept Fax: [phone]  Office Visit  Subjective:    Patient ID: Monica Mccoy, female    DOB: 02/23/1966, 56 y.o..   MRN: [id number]  Chief Complaint  Patient presents with   Acute Visit    C/o having urine frequency/burning and sore on bottom .      History of Present Illness:  Patient is in today for assessment of her urine. She has noted some strong odor to her urine and urinary frequency over the past week.She has a history of urinary incontinence and is managed on Myrbetriq.  Additionally, her sister had noted a sore on her buttocks about two days ago. She has had a small amount of bleeding from this site. Ms. Dilday has limited mobility related to a brain metastatic brain cancer.  Ms. Cashatt requests renewals for her Protonix, which manages her GERD well and for diclofenac which she takes for generalized pain issues.  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   Localization-related (focal) (partial) symptomatic epilepsy and epileptic syndromes with complex partial seizures, not intractable, without status epilepticus ([place]) 09/11/2021   Essential hypertension 05/30/2021   Acute encephalopathy 05/19/2021   Port-A-Cath in place 01/16/2021   Migraine without aura 12/19/2020   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[place]) 09/22/2020   Gastroesophageal reflux disease without esophagitis 06/29/2020   Other chronic pain 06/29/2020   Moderate episode of recurrent major depressive disorder ([place]) 06/29/2020   Urinary incontinence 06/29/2020   Past Surgical History:  Procedure Laterality Date   BRAIN SURGERY     brain tumor   MASTECTOMY  Bilateral    ORIF ANKLE FRACTURE Right    Family History  Problem Relation Age of Onset   Obesity Mother    Hypertension Mother    Hyperlipidemia Mother    Diabetes Mother    Heart disease Mother    Outpatient Medications Prior to Visit  Medication Sig Dispense Refill   amLODipine (NORVASC) 5 MG tablet TAKE 1 TABLET (5 MG TOTAL) BY MOUTH DAILY. 90 tablet 3   diclofenac (VOLTAREN) 50 MG EC tablet Take 1 tablet (50 mg total) by mouth 2 (two) times daily with a meal. Make sure eats or takes something before using this medicine 30 tablet 1   divalproex (DEPAKOTE) 250 MG DR tablet Take 1 tablet (250 mg total) by mouth 2 (two) times daily. 60 tablet 1   furosemide (LASIX) 20 MG tablet Take 1 tablet (20 mg total) by mouth daily. 30 tablet 3   MELATONIN GUMMIES PO Take 10 mg by mouth.     mirabegron ER (MYRBETRIQ) 50 MG TB24 tablet Take 1 tablet (50 mg total) by mouth daily. 90 tablet 1   pantoprazole (PROTONIX) 40 MG tablet Take 1 tablet (40 mg total) by mouth daily. 30 tablet 0   thiamine 100 MG tablet Take 1 tablet (100 mg total) by mouth daily. 30 tablet 11   gabapentin (NEURONTIN) 300 MG capsule Take 1 capsule (300 mg total) by mouth 2 (two) times daily between meals. 60 capsule 2   gabapentin (NEURONTIN) 300 MG capsule Take 2 capsules (600 mg total) by mouth at bedtime. 60 capsule 0   No facility-administered medications prior to visit.   Allergies  Allergen  Reactions   Dilaudid [Hydromorphone Hcl] Other (See Comments)    Confusion & hallucinations   Morphine And Related Other (See Comments)    HA   Penicillins    Tramadol     Objective:   Today's Vitals   03/09/22 1335  BP: 132/84  Temp: 97.8 F (36.6 C)  TempSrc: Oral  Weight: (!) 316 lb (143.3 kg)   Body mass index is 51.79 kg/m.   General: Well developed, well nourished. No acute distress. Buttocks. There is a 3-4 mm superficial unroofed blister over the lower right gluteal area. No induraiton or   redness  noted. Psych: Alert and oriented. Normal mood and affect.  Health Maintenance Due  Topic Date Due   URINE MICROALBUMIN  Never done   Hepatitis C Screening  Never done   TETANUS/TDAP  Never done   Zoster Vaccines- Shingrix (1 of 2) Never done   PAP SMEAR-Modifier  Never done   COLONOSCOPY (Pts 45-30yr Insurance coverage will need to be confirmed)  Never done   MAMMOGRAM  Never done   COVID-19 Vaccine (3 - Moderna risk series) 02/16/2020   Lab Results Component Ref Range & Units 13:32 1 mo ago 3 mo ago 9 mo ago  Color, UA  yellow      Clarity, UA  cloudy      Glucose, UA Negative Negative      Bilirubin, UA  negative      Ketones, UA  negative      Spec Grav, UA 1.010 - 1.025 1.010      Blood, UA  negative      pH, UA 5.0 - 8.0 8.0      Protein, UA Negative Negative      Urobilinogen, UA 0.2 or 1.0 E.U./dL 0.2      Nitrite, UA  positive      Leukocytes, UA Negative 4+ Abnormal       Comment: 15 Leu/uL  Appearance   CLOUDY Abnormal  R  CLEAR R  CLEAR R   Odor  strong          Assessment & Plan:   1. Acute cystitis without hematuria Ms. Durante's urine does look consistent with an acute cystitis. I will treat her with a course of Septra. I will send the urine for culture.  - POCT Urinalysis Dipstick - Urine Culture - sulfamethoxazole-trimethoprim (BACTRIM DS) 800-160 MG tablet; Take 1 tablet by mouth 2 (two) times daily.  Dispense: 6 tablet; Refill: 0  2. Pressure injury of right buttock, stage 2 (HCC) I applied Tegaderm over this site. I recommend they clean this daily with a moistened sanitary wipe and reapply a barrier dressing like Tegaderm.  Return for As scheduled.   SHaydee Salter MD

## 2022-03-14 ENCOUNTER — Telehealth: Payer: Self-pay | Admitting: Family Medicine

## 2022-03-14 NOTE — Telephone Encounter (Signed)
Left detailed VM on secure VM of Monica Mccoy for verbal orders for PT. Dm/cma

## 2022-03-14 NOTE — Telephone Encounter (Signed)
Cherri from [facility] needs a verbal order for PT. 2xw for 2 weeks and 1xw for 2 weeks to start next week 7/31.

## 2022-03-16 ENCOUNTER — Telehealth: Payer: Self-pay | Admitting: Family Medicine

## 2022-03-16 NOTE — Telephone Encounter (Signed)
Caller Name: [facility] Call back phone #: [phone]  Reason for Call: Pt is still having burning sensation. Was treated for a UTI, if any order is sent please call [facility]

## 2022-03-17 LAB — URINE CULTURE

## 2022-03-17 LAB — EXTRA URINE SPECIMEN

## 2022-03-19 ENCOUNTER — Other Ambulatory Visit: Payer: Self-pay

## 2022-03-19 NOTE — Telephone Encounter (Signed)
[facility] called back, Asked for another call '@[phone]

## 2022-03-19 NOTE — Telephone Encounter (Signed)
Called and they will call back. Dm/cma

## 2022-03-19 NOTE — Telephone Encounter (Signed)
Spoke to [place], with [facility], she will get in touch with [name], sister to have her bring in a Urine specimen or have her come her to collect urine for a culture to be done.  Dm/cma

## 2022-03-20 ENCOUNTER — Emergency Department (HOSPITAL_COMMUNITY): Payer: Medicare Other

## 2022-03-20 ENCOUNTER — Other Ambulatory Visit: Payer: Self-pay

## 2022-03-20 ENCOUNTER — Emergency Department (HOSPITAL_COMMUNITY)
Admission: EM | Admit: 2022-03-20 | Discharge: 2022-03-21 | Disposition: A | Discharge status: Home / Self Care | Payer: Medicare Other | Attending: Emergency Medicine | Admitting: Emergency Medicine

## 2022-03-20 ENCOUNTER — Telehealth: Payer: Self-pay | Admitting: Family Medicine

## 2022-03-20 ENCOUNTER — Ambulatory Visit: Payer: Medicare Other

## 2022-03-20 ENCOUNTER — Ambulatory Visit: Payer: Medicare Other | Admitting: Hematology and Oncology

## 2022-03-20 ENCOUNTER — Other Ambulatory Visit: Payer: Medicare Other

## 2022-03-20 DIAGNOSIS — R600 Localized edema: Secondary | ICD-10-CM

## 2022-03-20 DIAGNOSIS — R2243 Localized swelling, mass and lump, lower limb, bilateral: Secondary | ICD-10-CM | POA: Diagnosis not present

## 2022-03-20 DIAGNOSIS — R079 Chest pain, unspecified: Secondary | ICD-10-CM | POA: Insufficient documentation

## 2022-03-20 LAB — BASIC METABOLIC PANEL
Anion gap: 5 (ref 5–15)
BUN: 22 mg/dL — ABNORMAL HIGH (ref 6–20)
CO2: 30 mmol/L (ref 22–32)
Calcium: 8.6 mg/dL — ABNORMAL LOW (ref 8.9–10.3)
Chloride: 104 mmol/L (ref 98–111)
Creatinine, Ser: 1.08 mg/dL — ABNORMAL HIGH (ref 0.44–1.00)
GFR, Estimated: >60 mL/min (ref >60)
Glucose, Bld: 97 mg/dL (ref 70–99)
Potassium: 4.2 mmol/L (ref 3.5–5.1)
Sodium: 139 mmol/L (ref 135–145)

## 2022-03-20 LAB — CBC
HCT: 32.3 % — ABNORMAL LOW (ref 36.0–46.0)
Hemoglobin: 10.1 g/dL — ABNORMAL LOW (ref 12.0–15.0)
MCH: 29.6 pg (ref 26.0–34.0)
MCHC: 31.3 g/dL (ref 30.0–36.0)
MCV: 94.7 fL (ref 80.0–100.0)
Platelets: 190 10*3/uL (ref 150–400)
RBC: 3.41 MIL/uL — ABNORMAL LOW (ref 3.87–5.11)
RDW: 14 % (ref 11.5–15.5)
WBC: 3.6 10*3/uL — ABNORMAL LOW (ref 4.0–10.5)
nRBC: 0 % (ref 0.0–0.2)

## 2022-03-20 LAB — TROPONIN I (HIGH SENSITIVITY)
Troponin I (High Sensitivity): 5 ng/L (ref <18)
Troponin I (High Sensitivity): 4 ng/L (ref <18)

## 2022-03-20 MED ORDER — IOHEXOL 350 MG/ML SOLN
80.0000 mL | Freq: Once | INTRAVENOUS | Status: AC | PRN
  Administered 2022-03-20: 80 mL via INTRAVENOUS

## 2022-03-20 NOTE — ED Triage Notes (Signed)
Pt bib [facility] from home c/o CP that started 2.5hr ago while cleaning. CP radiated to Lt arm, worse with movement. Rates 8/10. Pt has confusion at baseline d/t hx of breast and brain cancer. Family confirmed pt is at baseline. EMS gave '324mg'$  asa and 2 nitro (0.'4mg'$ ). Pt expressed relief after admin of nitro.  EMS vitals 126/82 BP 87 CBG 97 O2 74 HR

## 2022-03-20 NOTE — Discharge Instructions (Signed)
You were seen in the emergency department for chest pain.  You had blood work EKG chest x-ray and a CAT scan of your chest that did not show an obvious explanation for your symptoms.  Return to the emergency department if any worsening or concerning symptoms.  Take 2 of your Lasix (furosemide) tablets every day for the next 3 days, then you can go back to your normal 1 tablet a day dose.

## 2022-03-20 NOTE — ED Provider Notes (Signed)
[facility]PART[facility] Provider Note   CSN: [id number] Arrival date & time: 03/20/22  1818     History {Add pertinent medical, surgical, social history, OB history to HPI:1} Chief Complaint  Patient presents with   Chest Pain    Monica Mccoy is a 56 y.o. female.  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  She is currently getting immunotherapy.  She is brought in by EMS for left-sided chest pain throbbing in nature radiating to left arm and associated with dizziness.  Symptoms started while she was cleaning her house.  Is been going on about 2 or 3 hours.  She rates it as 5 out of 10 currently but was 8 out of 10.  She was given aspirin and nitroglycerin with some improvement.  She said that she has had this pain before but they do not know what it is from.  She denies any prior cardiac history.  Smokes marijuana.  Per EMS she is at her baseline mental status as per her sister  The history is provided by the patient.  Chest Pain Pain location:  L chest Pain quality: throbbing   Pain radiates to:  L arm Pain severity:  Severe Onset quality:  Gradual Duration:  3 hours Timing:  Constant Progression:  Improving Chronicity:  Recurrent Context: movement and raising an arm   Relieved by:  None tried Worsened by:  Movement Ineffective treatments:  Aspirin and nitroglycerin Associated symptoms: dizziness   Associated symptoms: no cough, no diaphoresis, no fever, no nausea, no shortness of breath and no vomiting   Risk factors: hypertension        Home Medications Prior to Admission medications   Medication Sig Start Date End Date Taking? Authorizing Provider  amLODipine (NORVASC) 5 MG tablet TAKE 1 TABLET (5 MG TOTAL) BY MOUTH DAILY. 10/18/21   Haydee Salter, MD  diclofenac (VOLTAREN) 50 MG EC tablet Take 1 tablet (50 mg total) by mouth 2 (two) times daily with a meal. Make sure eats or takes something before using this medicine 03/09/22    Haydee Salter, MD  divalproex (DEPAKOTE) 250 MG DR tablet Take 1 tablet (250 mg total) by mouth 2 (two) times daily. 03/08/22   Vaslow, Acey Lav, MD  furosemide (LASIX) 20 MG tablet Take 1 tablet (20 mg total) by mouth daily. 02/12/22   Haydee Salter, MD  gabapentin (NEURONTIN) 300 MG capsule Take 1 capsule in AM, 1 capsule in PM, 2 capsules at bedtime 03/09/22   Cameron Sprang, MD  MELATONIN GUMMIES PO Take 10 mg by mouth.    [provider]  mirabegron ER (MYRBETRIQ) 50 MG TB24 tablet Take 1 tablet (50 mg total) by mouth daily. 03/09/22   Haydee Salter, MD  pantoprazole (PROTONIX) 40 MG tablet Take 1 tablet (40 mg total) by mouth daily. 03/09/22   Haydee Salter, MD  sulfamethoxazole-trimethoprim (BACTRIM DS) 800-160 MG tablet Take 1 tablet by mouth 2 (two) times daily. 03/09/22   Haydee Salter, MD  thiamine 100 MG tablet Take 1 tablet (100 mg total) by mouth daily. 02/02/22   Nita Sells, MD      Allergies    Dilaudid [hydromorphone hcl], Morphine and related, Penicillins, and Tramadol    Review of Systems   Review of Systems  Constitutional:  Negative for diaphoresis and fever.  Respiratory:  Negative for cough and shortness of breath.   Cardiovascular:  Positive for chest pain.  Gastrointestinal:  Negative for nausea and vomiting.  Neurological:  Positive for dizziness.    Physical Exam Updated Vital Signs BP 138/83   Pulse 70   Temp 97.7 F (36.5 C) (Oral)   Resp 20   SpO2 100%  Physical Exam Vitals and nursing note reviewed.  Constitutional:      General: She is not in acute distress.    Appearance: She is well-developed.  HENT:     Head: Normocephalic and atraumatic.  Eyes:     Conjunctiva/sclera: Conjunctivae normal.  Cardiovascular:     Rate and Rhythm: Normal rate and regular rhythm.     Heart sounds: Normal heart sounds. No murmur heard. Pulmonary:     Effort: Pulmonary effort is normal. No respiratory distress.     Breath sounds: Normal  breath sounds.  Abdominal:     Palpations: Abdomen is soft.     Tenderness: There is no abdominal tenderness.  Musculoskeletal:        General: No swelling. Normal range of motion.     Cervical back: Neck supple.     Right lower leg: Tenderness present. Edema present.     Left lower leg: Tenderness present. Edema present.  Skin:    General: Skin is warm and dry.     Capillary Refill: Capillary refill takes less than 2 seconds.  Neurological:     General: No focal deficit present.     Mental Status: She is alert.     ED Results / Procedures / Treatments   Labs (all labs ordered are listed, but only abnormal results are displayed) Labs Reviewed  BASIC METABOLIC PANEL  CBC  TROPONIN I (HIGH SENSITIVITY)    EKG None  Radiology No results found.  Procedures Procedures  {Document cardiac monitor, telemetry assessment procedure when appropriate:1}  Medications Ordered in ED Medications - No data to display  ED Course/ Medical Decision Making/ A&P                           Medical Decision Making Amount and/or Complexity of Data Reviewed Labs: ordered. Radiology: ordered.  This patient complains of ***; this involves an extensive number of treatment Options and is a complaint that carries with it a high risk of complications and morbidity. The differential includes ***  I ordered, reviewed and interpreted labs, which included *** I ordered medication *** and reviewed PMP when indicated. I ordered imaging studies which included *** and I independently    visualized and interpreted imaging which showed *** Additional history obtained from *** Previous records obtained and reviewed *** I consulted *** and discussed lab and imaging findings and discussed disposition.  Cardiac monitoring reviewed, *** Social determinants considered, *** Critical Interventions: ***  After the interventions stated above, I reevaluated the patient and found *** Admission and further  testing considered, ***    {Document critical care time when appropriate:1} {Document review of labs and clinical decision tools ie heart score, Chads2Vasc2 etc:1}  {Document your independent review of radiology images, and any outside records:1} {Document your discussion with family members, caretakers, and with consultants:1} {Document social determinants of health affecting pt's care:1} {Document your decision making why or why not admission, treatments were needed:1} Final Clinical Impression(s) / ED Diagnoses Final diagnoses:  None    Rx / DC Orders ED Discharge Orders     None

## 2022-03-20 NOTE — ED Notes (Addendum)
Patient ambulated to and from bathroom with assistance from this RN

## 2022-03-20 NOTE — ED Notes (Signed)
Patient transported to CT 

## 2022-03-21 DIAGNOSIS — R079 Chest pain, unspecified: Secondary | ICD-10-CM | POA: Diagnosis not present

## 2022-03-21 MED ORDER — FUROSEMIDE 10 MG/ML IJ SOLN
40.0000 mg | Freq: Once | INTRAMUSCULAR | Status: AC
  Administered 2022-03-21: 40 mg via INTRAVENOUS
  Filled 2022-03-21: qty 4

## 2022-03-21 NOTE — ED Notes (Signed)
RN reviewed discharge instructions with pt. Pt verbalized understanding and had no further questions. VSS upon discharge.  

## 2022-03-21 NOTE — ED Provider Notes (Signed)
Patient was signed out to me by Dr. Melina Copa.  CT angiography pending.  Patient seen earlier for chest pain.  Due to her history of metastatic cancer, PE was considered possible and therefore underwent CT angiography.  This study has been performed and there is no acute pathology including no evidence of PE.  Patient is no longer experiencing any chest pain but is complaining of significant swelling of her legs with pain.  Examination reveals no erythema or warmth to suggest infection.  Symmetric is bilateral symmetric and pitting in nature.  She does have some overlying skin changes consistent with chronic venous stasis.  Patient currently on Lasix 20 mg daily but reports that it is not helping her legs.  We will give a dose of IV Lasix here and then increase her Lasix for several days, follow-up in the outpatient setting.   Orpah Greek, MD 03/21/22 [phone]

## 2022-03-21 NOTE — Telephone Encounter (Signed)
error 

## 2022-03-22 NOTE — Addendum Note (Signed)
Addended by: Lynnea Ferrier on: 03/22/2022 08:01 AM   Modules accepted: Orders

## 2022-03-23 LAB — URINE CULTURE
MICRO NUMBER:: 13702977
SPECIMEN QUALITY:: ADEQUATE

## 2022-03-26 ENCOUNTER — Telehealth: Payer: Self-pay | Admitting: Family Medicine

## 2022-03-26 NOTE — Telephone Encounter (Signed)
See lab results. Dm/cma

## 2022-03-26 NOTE — Telephone Encounter (Signed)
Caller Name: Benard Rink Call back phone #: [phone]  Reason for Call: Stated that she was returning a call to [facility]

## 2022-03-26 NOTE — Progress Notes (Signed)
Patient Care Team: Haydee Salter, MD as PCP - General (Family Medicine) Nicholas Lose, MD as Consulting Physician (Hematology and Oncology) Cameron Sprang, MD as Consulting Physician (Neurology)  DIAGNOSIS:  Encounter Diagnosis  Name Primary?   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 BRCA gene testing was negative   08/06/2012 Surgery   Bilateral mastectomies:  Right breast: Invasive ductal carcinoma, 0.7cm, grade 2, with extensive DCIS, clear margins, 8/11 lymph nodes positive, measuring up to 3.8cm,  Left breast, no evidence of malignancy and 6 lymph nodes negative for carcinoma.    2014 - 2015 Chemotherapy   adjuvant chemotherapy with 4 cycles of dose dense Adriamycin and Cytoxan followed by 4 cycles of Taxol Herceptin and then Herceptin maintenance. She underwent right chest radiation.       12/24/2013 Relapse/Recurrence   Dizziness, headaches, ataxia, and memory loss. Head CT showed a 1.8cm and a 2.0cm frontal lobe masses. She underwent resection of both masses on 01/07/14,: Moderately differentiated carcinoma, likely of breast origin, HER-2 positive, ER/PR negative, CK7 positive.   2015 - 2015 Radiation Therapy   Received whole brain radiation    Chemotherapy   Taxotere, Herceptin and Perjeta, but Taxotere was discontinued after 4 cycles       11/21/2020 -  Chemotherapy   Patient is on Treatment Plan : BREAST Trastuzumab q21d     02/09/2022 Cancer Staging   Staging form: Breast, AJCC 8th Edition - Pathologic: Stage IV (pM1) - Signed by Gardenia Phlegm, NP on 02/09/2022     CHIEF COMPLIANT: Follow-up on Herceptin therapy    INTERVAL HISTORY: Monica Mccoy is a 56  y.o. with above-mentioned history of metastatic breast cancer who is currently on Herceptin maintenance therapy. She presents to the clinic today for follow-up. She denies any further seizures. She has pain in her feet and skin peeling for about 3 months. Legs also swelling and in pain. She complains of chest pain also. She has not had a bowel movement in a while. She had abdomen pain but it's better as of today. She has constipation.   ALLERGIES:  is allergic to dilaudid [hydromorphone hcl], morphine and related, penicillins, and tramadol.  MEDICATIONS:  Current Outpatient Medications  Medication Sig Dispense Refill   traMADol (ULTRAM) 50 MG tablet Take 1 tablet (50 mg total) by mouth 2 (two) times daily. 60 tablet 0   amLODipine (NORVASC) 5 MG tablet TAKE 1 TABLET (5 MG TOTAL) BY MOUTH DAILY. 90 tablet 3   diclofenac (VOLTAREN) 50 MG EC tablet Take 1 tablet (50 mg total) by mouth 2 (two) times daily with a meal. Make sure eats or takes something before using this medicine 30 tablet 1   divalproex (DEPAKOTE) 250 MG DR tablet Take 1 tablet (250 mg total) by mouth 2 (two) times daily. 60 tablet 1   furosemide (LASIX) 20 MG tablet Take 1 tablet (20 mg total) by mouth daily. 30 tablet 3   gabapentin (NEURONTIN) 300 MG capsule Take 1 capsule in AM, 1 capsule in PM, 2 capsules at bedtime 360 capsule 3   MELATONIN GUMMIES PO Take 10 mg by mouth.     mirabegron ER (MYRBETRIQ) 50 MG TB24 tablet Take 1 tablet (  50 mg total) by mouth daily. 90 tablet 1   pantoprazole (PROTONIX) 40 MG tablet Take 1 tablet (40 mg total) by mouth daily. 30 tablet 0   polyethylene glycol (MIRALAX / GLYCOLAX) 17 g packet Take 17 g by mouth daily as needed for moderate constipation. 14 each 0   thiamine 100 MG tablet Take 1 tablet (100 mg total) by mouth daily. 30 tablet 11   No current facility-administered medications for this visit.   Facility-Administered Medications Ordered in Other Visits  Medication Dose Route Frequency  Provider Last Rate Last Admin   heparin lock flush 100 unit/mL  500 Units Intracatheter Once PRN Truitt Merle, MD       sodium chloride flush (NS) 0.9 % injection 10 mL  10 mL Intracatheter PRN Truitt Merle, MD       trastuzumab-dkst (OGIVRI) 777 mg in sodium chloride 0.9 % 250 mL chemo infusion  6 mg/kg (Treatment Plan Recorded) Intravenous Once Truitt Merle, MD        PHYSICAL EXAMINATION: ECOG PERFORMANCE STATUS: 1 - Symptomatic but completely ambulatory  Vitals:   04/03/22 1459  BP: (!) 134/93  Pulse: 62  Resp: 18  Temp: (!) 97.5 F (36.4 C)  SpO2: 100%   Filed Weights   04/03/22 1459  Weight: (!) 314 lb 14.4 oz (142.8 kg)     LABORATORY DATA:  I have reviewed the data as listed    Latest Ref Rng & Units 04/03/2022    2:54 PM 04/02/2022    4:38 PM 03/20/2022    6:40 PM  CMP  Glucose 70 - 99 mg/dL 89  87  97   BUN 6 - 20 mg/dL _0 Creatinine 0.44 - 1.00 mg/dL 0.87  0.86  1.08   Sodium 135 - 145 mmol/L 138  138  139   Potassium 3.5 - 5.1 mmol/L 3.7  3.9  4.2   Chloride 98 - 111 mmol/L 102  104  104   CO2 22 - 32 mmol/L 32  26  30   Calcium 8.9 - 10.3 mg/dL 8.8  9.1  8.6   Total Protein 6.5 - 8.1 g/dL 7.7  8.1    Total Bilirubin 0.3 - 1.2 mg/dL 0.8  0.8    Alkaline Phos 38 - 126 U/L 70  67    AST 15 - 41 U/L 11  15    ALT 0 - 44 U/L 6  11      Lab Results  Component Value Date   WBC 3.2 (L) 04/03/2022   HGB 10.7 (L) 04/03/2022   HCT 32.7 (L) 04/03/2022   MCV 89.3 04/03/2022   PLT 197 04/03/2022   NEUTROABS 1.5 (L) 04/03/2022    ASSESSMENT & PLAN:  Cancer of right breast metastatic to brain [facility]) Moved to [facility] from [place], [facility] in 02/2020 to live with her parents and son near her sister. (2013 HER2 positive breast cancer status post bilateral mastectomies and adjuvant chemotherapy with Herceptin, April 2015 brain metastases status post resection and radiation)   Prior treatment: Patient was on Herceptin and Perjeta  maintenance until June 2021 Because of insurance issues she has not had treatment for 7 months until she moved to [place].   Current treatment: Herceptin maintenance every 4 weeks started 11/21/2020 Herceptin toxicities: None   CT chest abdomen pelvis 09/27/2021: No new or progressive findings to suggest recurrent or metastatic disease Brain MRI 09/26/2020 (confusion as well as frequent falls.):  No intracranial abnormalities Brain MRI 11/28/2021: No change   CT CAP 03/29/2022: Stable examination without any progression.  Cholelithiasis.   Hospitalization 01/28/2022-02/02/2022: Seizures: Recommendation of taking higher doses of gabapentin along with Depakote  Severe pain in the feet and left chest wall: Previously she was on tramadol which appeared to be helping.  I sent a new prescription for that.  We will consult with palliative care.  Return to clinic for Herceptin     No orders of the defined types were placed in this encounter.  The patient has a good understanding of the overall plan. she agrees with it. she will call with any problems that may develop before the next visit here. Total time spent: 30 mins including face to face time and time spent for planning, charting and co-ordination of care   Harriette Ohara, MD 04/03/22    I Gardiner Coins am scribing for Dr. Lindi Adie  I have reviewed the above documentation for accuracy and completeness, and I agree with the above.

## 2022-03-27 ENCOUNTER — Telehealth: Payer: Self-pay | Admitting: Family Medicine

## 2022-03-27 ENCOUNTER — Ambulatory Visit (INDEPENDENT_AMBULATORY_CARE_PROVIDER_SITE_OTHER): Payer: Medicare Other

## 2022-03-27 DIAGNOSIS — Z0000 Encounter for general adult medical examination without abnormal findings: Secondary | ICD-10-CM

## 2022-03-27 DIAGNOSIS — Z1211 Encounter for screening for malignant neoplasm of colon: Secondary | ICD-10-CM | POA: Diagnosis not present

## 2022-03-27 NOTE — Patient Instructions (Signed)
[name] , Thank you for taking time to come for your Medicare Wellness Visit. I appreciate your ongoing commitment to your health goals. Please review the following plan we discussed and let me know if I can assist you in the future.   Screening recommendations/referrals: Colonoscopy: referral 03/27/2022 Mammogram: mastectomy  Bone Density: not of age  Recommended yearly ophthalmology/optometry visit for glaucoma screening and checkup Recommended yearly dental visit for hygiene and checkup  Vaccinations: Influenza vaccine: completed  Pneumococcal vaccine: due  Tdap vaccine: due  Shingles vaccine: will consider     Advanced directives: none   Conditions/risks identified: none   Next appointment: none    Preventive Care 36 Years and Older, Female Preventive care refers to lifestyle choices and visits with your health care provider that can promote health and wellness. What does preventive care include? A yearly physical exam. This is also called an annual well check. Dental exams once or twice a year. Routine eye exams. Ask your health care provider how often you should have your eyes checked. Personal lifestyle choices, including: Daily care of your teeth and gums. Regular physical activity. Eating a healthy diet. Avoiding tobacco and drug use. Limiting alcohol use. Practicing safe sex. Taking low-dose aspirin every day. Taking vitamin and mineral supplements as recommended by your health care provider. What happens during an annual well check? The services and screenings done by your health care provider during your annual well check will depend on your age, overall health, lifestyle risk factors, and family history of disease. Counseling  Your health care provider may ask you questions about your: Alcohol use. Tobacco use. Drug use. Emotional well-being. Home and relationship well-being. Sexual activity. Eating habits. History of falls. Memory and ability to  understand (cognition). Work and work Statistician. Reproductive health. Screening  You may have the following tests or measurements: Height, weight, and BMI. Blood pressure. Lipid and cholesterol levels. These may be checked every 5 years, or more frequently if you are over 22 years old. Skin check. Lung cancer screening. You may have this screening every year starting at age 26 if you have a 30-pack-year history of smoking and currently smoke or have quit within the past 15 years. Fecal occult blood test (FOBT) of the stool. You may have this test every year starting at age 34. Flexible sigmoidoscopy or colonoscopy. You may have a sigmoidoscopy every 5 years or a colonoscopy every 10 years starting at age 28. Hepatitis C blood test. Hepatitis B blood test. Sexually transmitted disease (STD) testing. Diabetes screening. This is done by checking your blood sugar (glucose) after you have not eaten for a while (fasting). You may have this done every 1-3 years. Bone density scan. This is done to screen for osteoporosis. You may have this done starting at age 5. Mammogram. This may be done every 1-2 years. Talk to your health care provider about how often you should have regular mammograms. Talk with your health care provider about your test results, treatment options, and if necessary, the need for more tests. Vaccines  Your health care provider may recommend certain vaccines, such as: Influenza vaccine. This is recommended every year. Tetanus, diphtheria, and acellular pertussis (Tdap, Td) vaccine. You may need a Td booster every 10 years. Zoster vaccine. You may need this after age 4. Pneumococcal 13-valent conjugate (PCV13) vaccine. One dose is recommended after age 61. Pneumococcal polysaccharide (PPSV23) vaccine. One dose is recommended after age 31. Talk to your health care provider about which screenings and  vaccines you need and how often you need them. This information is not  intended to replace advice given to you by your health care provider. Make sure you discuss any questions you have with your health care provider. Document Released: 09/09/2015 Document Revised: 05/02/2016 Document Reviewed: 06/14/2015 Elsevier Interactive Patient Education  2017 [place] Prevention in the Home Falls can cause injuries. They can happen to people of all ages. There are many things you can do to make your home safe and to help prevent falls. What can I do on the outside of my home? Regularly fix the edges of walkways and driveways and fix any cracks. Remove anything that might make you trip as you walk through a door, such as a raised step or threshold. Trim any bushes or trees on the path to your home. Use bright outdoor lighting. Clear any walking paths of anything that might make someone trip, such as rocks or tools. Regularly check to see if handrails are loose or broken. Make sure that both sides of any steps have handrails. Any raised decks and porches should have guardrails on the edges. Have any leaves, snow, or ice cleared regularly. Use sand or salt on walking paths during winter. Clean up any spills in your garage right away. This includes oil or grease spills. What can I do in the bathroom? Use night lights. Install grab bars by the toilet and in the tub and shower. Do not use towel bars as grab bars. Use non-skid mats or decals in the tub or shower. If you need to sit down in the shower, use a plastic, non-slip stool. Keep the floor dry. Clean up any water that spills on the floor as soon as it happens. Remove soap buildup in the tub or shower regularly. Attach bath mats securely with double-sided non-slip rug tape. Do not have throw rugs and other things on the floor that can make you trip. What can I do in the bedroom? Use night lights. Make sure that you have a light by your bed that is easy to reach. Do not use any sheets or blankets that are  too big for your bed. They should not hang down onto the floor. Have a firm chair that has side arms. You can use this for support while you get dressed. Do not have throw rugs and other things on the floor that can make you trip. What can I do in the kitchen? Clean up any spills right away. Avoid walking on wet floors. Keep items that you use a lot in easy-to-reach places. If you need to reach something above you, use a strong step stool that has a grab bar. Keep electrical cords out of the way. Do not use floor polish or wax that makes floors slippery. If you must use wax, use non-skid floor wax. Do not have throw rugs and other things on the floor that can make you trip. What can I do with my stairs? Do not leave any items on the stairs. Make sure that there are handrails on both sides of the stairs and use them. Fix handrails that are broken or loose. Make sure that handrails are as long as the stairways. Check any carpeting to make sure that it is firmly attached to the stairs. Fix any carpet that is loose or worn. Avoid having throw rugs at the top or bottom of the stairs. If you do have throw rugs, attach them to the floor with carpet tape. Make  sure that you have a light switch at the top of the stairs and the bottom of the stairs. If you do not have them, ask someone to add them for you. What else can I do to help prevent falls? Wear shoes that: Do not have high heels. Have rubber bottoms. Are comfortable and fit you well. Are closed at the toe. Do not wear sandals. If you use a stepladder: Make sure that it is fully opened. Do not climb a closed stepladder. Make sure that both sides of the stepladder are locked into place. Ask someone to hold it for you, if possible. Clearly mark and make sure that you can see: Any grab bars or handrails. First and last steps. Where the edge of each step is. Use tools that help you move around (mobility aids) if they are needed. These  include: Canes. Walkers. Scooters. Crutches. Turn on the lights when you go into a dark area. Replace any light bulbs as soon as they burn out. Set up your furniture so you have a clear path. Avoid moving your furniture around. If any of your floors are uneven, fix them. If there are any pets around you, be aware of where they are. Review your medicines with your doctor. Some medicines can make you feel dizzy. This can increase your chance of falling. Ask your doctor what other things that you can do to help prevent falls. This information is not intended to replace advice given to you by your health care provider. Make sure you discuss any questions you have with your health care provider. Document Released: 06/09/2009 Document Revised: 01/19/2016 Document Reviewed: 09/17/2014 Elsevier Interactive Patient Education  2017 [employer].

## 2022-03-27 NOTE — Progress Notes (Signed)
Subjective:   Monica Mccoy is a 56 y.o. female who presents for Medicare Annual (Subsequent) preventive examination.   I connected with [name]  today by telephone and verified that I am speaking with the correct person using two identifiers. Location patient: home Location provider: work Persons participating in the virtual visit: patient, provider.   I discussed the limitations, risks, security and privacy concerns of performing an evaluation and management service by telephone and the availability of in person appointments. I also discussed with the patient that there may be a patient responsible charge related to this service. The patient expressed understanding and verbally consented to this telephonic visit.    Interactive audio and video telecommunications were attempted between this provider and patient, however failed, due to patient having technical difficulties OR patient did not have access to video capability.  We continued and completed visit with audio only.    Review of Systems     Cardiac Risk Factors include: advanced age (>17mn, >>90women)     Objective:    Today's Vitals   There is no height or weight on file to calculate BMI.     03/27/2022    1:31 PM 03/20/2022    6:27 PM 03/09/2022    9:50 AM 02/09/2022   11:50 AM 01/29/2022   10:50 AM 12/31/2021    9:21 PM 12/26/2021   10:17 AM  Advanced Directives  Does Patient Have a Medical Advance Directive? No No No No No No No  Would patient like information on creating a medical advance directive? No - Patient declined   No - Patient declined No - Patient declined No - Patient declined No - Patient declined    Current Medications (verified) Outpatient Encounter Medications as of 03/27/2022  Medication Sig   amLODipine (NORVASC) 5 MG tablet TAKE 1 TABLET (5 MG TOTAL) BY MOUTH DAILY.   diclofenac (VOLTAREN) 50 MG EC tablet Take 1 tablet (50 mg total) by mouth 2 (two) times daily with a meal. Make sure eats or  takes something before using this medicine   divalproex (DEPAKOTE) 250 MG DR tablet Take 1 tablet (250 mg total) by mouth 2 (two) times daily.   furosemide (LASIX) 20 MG tablet Take 1 tablet (20 mg total) by mouth daily.   gabapentin (NEURONTIN) 300 MG capsule Take 1 capsule in AM, 1 capsule in PM, 2 capsules at bedtime   MELATONIN GUMMIES PO Take 10 mg by mouth.   mirabegron ER (MYRBETRIQ) 50 MG TB24 tablet Take 1 tablet (50 mg total) by mouth daily.   pantoprazole (PROTONIX) 40 MG tablet Take 1 tablet (40 mg total) by mouth daily.   sulfamethoxazole-trimethoprim (BACTRIM DS) 800-160 MG tablet Take 1 tablet by mouth 2 (two) times daily.   thiamine 100 MG tablet Take 1 tablet (100 mg total) by mouth daily.   No facility-administered encounter medications on file as of 03/27/2022.    Allergies (verified) Dilaudid [hydromorphone hcl], Morphine and related, Penicillins, and Tramadol   History: Past Medical History:  Diagnosis Date   Anxiety    Breast cancer metastasized to brain ([facility]    Left breast   Chronic pain after cancer treatment    Depression    GERD (gastroesophageal reflux disease)    History of cancer chemotherapy    Breast cancer left breast   Seizure ([facility]    Past Surgical History:  Procedure Laterality Date   BRAIN SURGERY     brain tumor   MASTECTOMY Bilateral  ORIF ANKLE FRACTURE Right    Family History  Problem Relation Age of Onset   Obesity Mother    Hypertension Mother    Hyperlipidemia Mother    Diabetes Mother    Heart disease Mother    Social History   Socioeconomic History   Marital status: Divorced    Spouse name: Not on file   Number of children: 3   Years of education: Not on file   Highest education level: Not on file  Occupational History   Occupation: Disabled  Tobacco Use   Smoking status: Never   Smokeless tobacco: Never  Vaping Use   Vaping Use: Never used  Substance and Sexual Activity   Alcohol use: Yes    Comment: On  occasion   Drug use: Yes    Types: Marijuana    Comment: Occasional use   Sexual activity: Not Currently  Other Topics Concern   Not on file  Social History Narrative   Right handed   Drinks caffeine   Two story home   Social Determinants of Health   Financial Resource Strain: Low Risk  (03/27/2022)   Overall Financial Resource Strain (CARDIA)    Difficulty of Paying Living Expenses: Not hard at all  Food Insecurity: No Food Insecurity (03/27/2022)   Hunger Vital Sign    Worried About Running Out of Food in the Last Year: Never true    [place] in the Last Year: Never true  Transportation Needs: No Transportation Needs (03/27/2022)   PRAPARE - Hydrologist (Medical): No    Lack of Transportation (Non-Medical): No  Physical Activity: Insufficiently Active (03/27/2022)   Exercise Vital Sign    Days of Exercise per Week: 1 day    Minutes of Exercise per Session: 30 min  Stress: No Stress Concern Present (03/27/2022)   [place]    Feeling of Stress : Not at all  Social Connections: Moderately Isolated (03/27/2022)   Social Connection and Isolation Panel [NHANES]    Frequency of Communication with Friends and Family: Three times a week    Frequency of Social Gatherings with Friends and Family: Three times a week    Attends Religious Services: More than 4 times per year    Active Member of Clubs or Organizations: No    Attends Archivist Meetings: Never    Marital Status: Divorced    Tobacco Counseling Counseling given: Not Answered   Clinical Intake:  Pre-visit preparation completed: Yes  Pain : No/denies pain     Nutritional Risks: None Diabetes: No  How often do you need to have someone help you when you read instructions, pamphlets, or other written materials from your doctor or pharmacy?: 1 - Never What is the last grade level you completed in school?: [place]   Interpreter Needed?: No  Information entered by :: L.Wilson,LPN   Activities of Daily Living    03/27/2022    1:36 PM 01/29/2022   10:50 AM  In your present state of health, do you have any difficulty performing the following activities:  Hearing? 0 0  Vision? 0 0  Difficulty concentrating or making decisions? 0 1  Walking or climbing stairs? 0 1  Dressing or bathing? 1 1  Doing errands, shopping? 1 1  Preparing Food and eating ? N   Using the Toilet? N   In the past six months, have you accidently leaked  urine? N   Do you have problems with loss of bowel control? N   Managing your Medications? Y   Managing your Finances? Y   Housekeeping or managing your Housekeeping? Y     Patient Care Team: Haydee Salter, MD as PCP - General (Family Medicine) Nicholas Lose, MD as Consulting Physician (Hematology and Oncology) Cameron Sprang, MD as Consulting Physician (Neurology)  Indicate any recent Medical Services you may have received from other than [facility] providers in the past year (date may be approximate).     Assessment:   This is a routine wellness examination for Monica Mccoy.  Hearing/Vision screen Vision Screening - Comments:: Declined   Dietary issues and exercise activities discussed: Current Exercise Habits: Home exercise routine, Type of exercise: walking;strength training/weights, Time (Minutes): 30, Frequency (Times/Week): 3, Weekly Exercise (Minutes/Week): 90, Intensity: Mild, Exercise limited by: neurologic condition(s)   Goals Addressed   None    Depression Screen    03/27/2022    1:32 PM 03/27/2022    1:29 PM 01/08/2022    9:30 AM 10/16/2021    1:25 PM 08/30/2020    9:18 AM 06/29/2020   12:25 PM  PHQ 2/9 Scores  PHQ - 2 Score 0 0 '3 4 2 4  '$ PHQ- 9 Score   '14 13 7 14    '$ Fall Risk    03/27/2022    1:31 PM 03/09/2022    9:50 AM 01/08/2022    9:29 AM 06/07/2021    9:15 AM 08/30/2020    9:17 AM  Fall Risk   Falls in the past year? '1 1 1 1 1   '$ Number falls in past yr: 0 '1 1 1 1  '$ Injury with Fall? 0 0 1 1 0  Risk for fall due to :     History of fall(s);Impaired balance/gait  Risk for fall due to: Comment walker      Follow up Falls evaluation completed;Education provided    Falls prevention discussed    FALL RISK PREVENTION PERTAINING TO THE HOME:  Any stairs in or around the home? Yes  If so, are there any without handrails? No  Home free of loose throw rugs in walkways, pet beds, electrical cords, etc? Yes  Adequate lighting in your home to reduce risk of falls? Yes   ASSISTIVE DEVICES UTILIZED TO PREVENT FALLS:  Life alert? No  Use of a cane, walker or w/c? Yes  Grab bars in the bathroom? Yes  Shower chair or bench in shower? Yes  Elevated toilet seat or a handicapped toilet? Yes     Cognitive Function:  Normal cognitive status assessed by telephone conversation by this Nurse Health Advisor. No abnormalities found.      06/07/2021    9:00 AM  MMSE - Mini Mental State Exam  Orientation to time 0  Orientation to Place 1  Registration 3  Attention/ Calculation 0  Recall 1  Language- name 2 objects 2  Language- repeat 1  Language- follow 3 step command 1  Language- read & follow direction 1  Write a sentence 1  Copy design 0  Total score 11        03/27/2022    1:37 PM  6CIT Screen  What Year? 0 points  What month? 0 points  What time? 0 points  Count back from 20 0 points  Months in reverse 2 points  Repeat phrase 2 points  Total Score 4 points    Immunizations Immunization History  Administered Date(s) Administered  Influenza,inj,Quad PF,6+ Mos 05/30/2021   Moderna Sars-Covid-2 Vaccination 12/19/2019, 01/19/2020    TDAP status: Due, Education has been provided regarding the importance of this vaccine. Advised may receive this vaccine at local pharmacy or Health Dept. Aware to provide a copy of the vaccination record if obtained from local pharmacy or Health Dept. Verbalized acceptance and  understanding.  Flu Vaccine status: Up to date  Pneumococcal vaccine status: Due, Education has been provided regarding the importance of this vaccine. Advised may receive this vaccine at local pharmacy or Health Dept. Aware to provide a copy of the vaccination record if obtained from local pharmacy or Health Dept. Verbalized acceptance and understanding.  Covid-19 vaccine status: Completed vaccines  Qualifies for Shingles Vaccine? Yes   Zostavax completed No   Shingrix Completed?: No.    Education has been provided regarding the importance of this vaccine. Patient has been advised to call insurance company to determine out of pocket expense if they have not yet received this vaccine. Advised may also receive vaccine at local pharmacy or Health Dept. Verbalized acceptance and understanding.  Screening Tests Health Maintenance  Topic Date Due   URINE MICROALBUMIN  Never done   MAMMOGRAM  Never done   Hepatitis C Screening  Never done   TETANUS/TDAP  Never done   Zoster Vaccines- Shingrix (1 of 2) Never done   PAP SMEAR-Modifier  Never done   COLONOSCOPY (Pts 45-28yr Insurance coverage will need to be confirmed)  Never done   COVID-19 Vaccine (3 - Moderna risk series) 02/16/2020   INFLUENZA VACCINE  03/27/2022   HIV Screening  Completed   HPV VACCINES  Aged Out    Health Maintenance  Health Maintenance Due  Topic Date Due   URINE MICROALBUMIN  Never done   MAMMOGRAM  Never done   Hepatitis C Screening  Never done   TETANUS/TDAP  Never done   Zoster Vaccines- Shingrix (1 of 2) Never done   PAP SMEAR-Modifier  Never done   COLONOSCOPY (Pts 45-456yrInsurance coverage will need to be confirmed)  Never done   COVID-19 Vaccine (3 - Moderna risk series) 02/16/2020   INFLUENZA VACCINE  03/27/2022    Colorectal cancer screening: Referral to GI placed 03/27/2022. Pt aware the office will call re: appt.  Mammogram status: Ordered declined mastectomy . Pt provided with contact info  and advised to call to schedule appt.   Bone Density status: Ordered not of age . Pt provided with contact info and advised to call to schedule appt.  Lung Cancer Screening: (Low Dose CT Chest recommended if Age 55-80ears, 30 pack-year currently smoking OR have quit w/in 15years.) does not qualify.   Lung Cancer Screening Referral: n/a  Additional Screening:  Hepatitis C Screening: does not qualify;  Vision Screening: Recommended annual ophthalmology exams for early detection of glaucoma and other disorders of the eye. Is the patient up to date with their annual eye exam?  No  Who is the provider or what is the name of the office in which the patient attends annual eye exams? Patient will schedule  If pt is not established with a provider, would they like to be referred to a provider to establish care? No .   Dental Screening: Recommended annual dental exams for proper oral hygiene  Community Resource Referral / Chronic Care Management: CRR required this visit?  No   CCM required this visit?  No      Plan:     I have personally reviewed  and noted the following in the patient's chart:   Medical and social history Use of alcohol, tobacco or illicit drugs  Current medications and supplements including opioid prescriptions.  Functional ability and status Nutritional status Physical activity Advanced directives List of other physicians Hospitalizations, surgeries, and ER visits in previous 12 months Vitals Screenings to include cognitive, depression, and falls Referrals and appointments  In addition, I have reviewed and discussed with patient certain preventive protocols, quality metrics, and best practice recommendations. A written personalized care plan for preventive services as well as general preventive health recommendations were provided to patient.     Daphane Shepherd, LPN   7/0/1410   Nurse Notes: none

## 2022-03-27 NOTE — Telephone Encounter (Signed)
Lft detailed VM okaying extended OT . Dm/cma

## 2022-03-27 NOTE — Telephone Encounter (Signed)
Monica Mccoy from [facility] is needing verbal orders for OT extended 2X/2W. His vm is secure @ [phone]

## 2022-03-28 NOTE — Telephone Encounter (Signed)
Error

## 2022-03-29 ENCOUNTER — Ambulatory Visit (HOSPITAL_COMMUNITY)
Admission: RE | Admit: 2022-03-29 | Discharge: 2022-03-29 | Disposition: A | Discharge status: Home / Self Care | Payer: Medicare Other | Source: Ambulatory Visit | Attending: Hematology and Oncology | Admitting: Hematology and Oncology

## 2022-03-29 ENCOUNTER — Encounter (HOSPITAL_COMMUNITY): Payer: Self-pay | Admitting: Radiology

## 2022-03-29 DIAGNOSIS — C50911 Malignant neoplasm of unspecified site of right female breast: Secondary | ICD-10-CM | POA: Insufficient documentation

## 2022-03-29 DIAGNOSIS — C7931 Secondary malignant neoplasm of brain: Secondary | ICD-10-CM | POA: Diagnosis present

## 2022-03-29 MED ORDER — SODIUM CHLORIDE (PF) 0.9 % IJ SOLN
INTRAMUSCULAR | Status: AC
  Filled 2022-03-29: qty 50

## 2022-03-29 MED ORDER — IOHEXOL 300 MG/ML  SOLN
100.0000 mL | Freq: Once | INTRAMUSCULAR | Status: AC | PRN
  Administered 2022-03-29: 100 mL via INTRAVENOUS

## 2022-04-02 ENCOUNTER — Encounter (HOSPITAL_COMMUNITY): Payer: Self-pay | Admitting: Emergency Medicine

## 2022-04-02 ENCOUNTER — Telehealth: Payer: Self-pay | Admitting: Family Medicine

## 2022-04-02 ENCOUNTER — Emergency Department (HOSPITAL_COMMUNITY)
Admission: EM | Admit: 2022-04-02 | Discharge: 2022-04-02 | Disposition: A | Discharge status: Home / Self Care | Payer: Medicare Other | Attending: Emergency Medicine | Admitting: Emergency Medicine

## 2022-04-02 DIAGNOSIS — Z853 Personal history of malignant neoplasm of breast: Secondary | ICD-10-CM | POA: Insufficient documentation

## 2022-04-02 DIAGNOSIS — K5900 Constipation, unspecified: Secondary | ICD-10-CM

## 2022-04-02 DIAGNOSIS — R1030 Lower abdominal pain, unspecified: Secondary | ICD-10-CM

## 2022-04-02 DIAGNOSIS — R109 Unspecified abdominal pain: Secondary | ICD-10-CM | POA: Diagnosis present

## 2022-04-02 LAB — CBC WITH DIFFERENTIAL/PLATELET
Abs Immature Granulocytes: 0.05 10*3/uL (ref 0.00–0.07)
Basophils Absolute: 0 10*3/uL (ref 0.0–0.1)
Basophils Relative: 1 %
Eosinophils Absolute: 0.1 10*3/uL (ref 0.0–0.5)
Eosinophils Relative: 3 %
HCT: 34.4 % — ABNORMAL LOW (ref 36.0–46.0)
Hemoglobin: 10.9 g/dL — ABNORMAL LOW (ref 12.0–15.0)
Immature Granulocytes: 1 %
Lymphocytes Relative: 24 %
Lymphs Abs: 1 10*3/uL (ref 0.7–4.0)
MCH: 29.1 pg (ref 26.0–34.0)
MCHC: 31.7 g/dL (ref 30.0–36.0)
MCV: 92 fL (ref 80.0–100.0)
Monocytes Absolute: 0.6 10*3/uL (ref 0.1–1.0)
Monocytes Relative: 15 %
Neutro Abs: 2.3 10*3/uL (ref 1.7–7.7)
Neutrophils Relative %: 56 %
Platelets: 207 10*3/uL (ref 150–400)
RBC: 3.74 MIL/uL — ABNORMAL LOW (ref 3.87–5.11)
RDW: 13.7 % (ref 11.5–15.5)
WBC: 4.1 10*3/uL (ref 4.0–10.5)
nRBC: 0 % (ref 0.0–0.2)

## 2022-04-02 LAB — COMPREHENSIVE METABOLIC PANEL
ALT: 11 U/L (ref 0–44)
AST: 15 U/L (ref 15–41)
Albumin: 3.8 g/dL (ref 3.5–5.0)
Alkaline Phosphatase: 67 U/L (ref 38–126)
Anion gap: 8 (ref 5–15)
BUN: 13 mg/dL (ref 6–20)
CO2: 26 mmol/L (ref 22–32)
Calcium: 9.1 mg/dL (ref 8.9–10.3)
Chloride: 104 mmol/L (ref 98–111)
Creatinine, Ser: 0.86 mg/dL (ref 0.44–1.00)
GFR, Estimated: >60 mL/min (ref >60)
Glucose, Bld: 87 mg/dL (ref 70–99)
Potassium: 3.9 mmol/L (ref 3.5–5.1)
Sodium: 138 mmol/L (ref 135–145)
Total Bilirubin: 0.8 mg/dL (ref 0.3–1.2)
Total Protein: 8.1 g/dL (ref 6.5–8.1)

## 2022-04-02 LAB — URINALYSIS, ROUTINE W REFLEX MICROSCOPIC
Bilirubin Urine: NEGATIVE
Glucose, UA: NEGATIVE mg/dL
Hgb urine dipstick: NEGATIVE
Ketones, ur: NEGATIVE mg/dL
Leukocytes,Ua: NEGATIVE
Nitrite: NEGATIVE
Protein, ur: NEGATIVE mg/dL
Specific Gravity, Urine: 1.013 (ref 1.005–1.030)
pH: 6 (ref 5.0–8.0)

## 2022-04-02 LAB — LIPASE, BLOOD: Lipase: 24 U/L (ref 11–51)

## 2022-04-02 MED ORDER — ACETAMINOPHEN 500 MG PO TABS
500.0000 mg | ORAL_TABLET | Freq: Once | ORAL | Status: AC
Start: 2022-04-02 — End: 2022-04-02
  Administered 2022-04-02: 500 mg via ORAL
  Filled 2022-04-02: qty 1

## 2022-04-02 MED ORDER — POLYETHYLENE GLYCOL 3350 17 G PO PACK: 17.0000 g | PACK | Freq: Every day | ORAL | 0 refills | Status: DC | PRN

## 2022-04-02 NOTE — Telephone Encounter (Signed)
Lft VM to rtn call. Dm/cma  

## 2022-04-02 NOTE — ED Triage Notes (Signed)
Pt arrives via EMS with abd pain that started this morning, and frequent urination.

## 2022-04-02 NOTE — Telephone Encounter (Signed)
Tammy from [facility]. Called to speak to Dr Gena Fray, Ms Welle's BP is 158/115. She feels dizzy and has a stomach ache. Please call and advise.

## 2022-04-02 NOTE — Discharge Instructions (Addendum)
I would take the MiraLAX for the next few days to try and help with the constipation.  Your workup was reassuring today.

## 2022-04-02 NOTE — ED Provider Triage Note (Signed)
Emergency Medicine Provider Triage Evaluation Note  Monica Mccoy , a 56 y.o. female  was evaluated in triage.  Pt complains of abdominal pain and urinary frequency.  Patient reports that she woke up with epigastric abdominal pain this morning.  Pain has been constant since then.  Patient denies any radiation of pain.  Patient endorses some feelings of nausea.  Patient also endorses urinary frequency throughout today.  Review of Systems  Positive: Epigastric abdominal pain, urinary frequency, nausea Negative: Fever, chills, constipation, diarrhea, blood in stool, melena, dysuria, hematuria  Physical Exam  BP (!) 135/106 (BP Location: Right Arm)   Pulse 65   Temp 98.3 F (36.8 C) (Oral)   Resp 16   Ht 5' 5.5" (1.664 m)   Wt (!) 143 kg   SpO2 98%   BMI 51.66 kg/m  Gen:   Awake, no distress   Resp:  Normal effort  MSK:   Moves extremities without difficulty  Other:  Abdomen soft, nondistended, mild tenderness to right upper quadrant, increased tenderness to epigastric area.  No guarding or rebound tenderness.  Medical Decision Making  Medically screening exam initiated at 3:40 PM.  Appropriate orders placed.  Margeret Riden was informed that the remainder of the evaluation will be completed by another provider, this initial triage assessment does not replace that evaluation, and the importance of remaining in the ED until their evaluation is complete.     Loni Beckwith, [place] 04/02/22 1541

## 2022-04-02 NOTE — ED Provider Notes (Signed)
[facility] DEPT Provider Note   CSN: [id number] Arrival date & time: 04/02/22  1511     History  Chief Complaint  Patient presents with   Abdominal Pain    Monica Mccoy is a 56 y.o. female.   Abdominal Pain Patient presents abdominal pain.  Urinary frequency.  Thinks she may have a UTI.  Does have history of abdominal pain.  Previous metastatic cancer to the brain.  Also some swelling in her legs.  States that has been a chronic issue also.  No fevers.  Some nausea but no vomiting.  No diarrhea.  Did have recent CT scan 4 days ago that showed some constipation.    Past Medical History:  Diagnosis Date   Anxiety    Breast cancer metastasized to brain [facility])    Left breast   Chronic pain after cancer treatment    Depression    GERD (gastroesophageal reflux disease)    History of cancer chemotherapy    Breast cancer left breast   Seizure ([place])     Home Medications Prior to Admission medications   Medication Sig Start Date End Date Taking? Authorizing Provider  polyethylene glycol (MIRALAX / GLYCOLAX) 17 g packet Take 17 g by mouth daily as needed for moderate constipation. 04/02/22  Yes Davonna Belling, MD  amLODipine (NORVASC) 5 MG tablet TAKE 1 TABLET (5 MG TOTAL) BY MOUTH DAILY. 10/18/21   Haydee Salter, MD  diclofenac (VOLTAREN) 50 MG EC tablet Take 1 tablet (50 mg total) by mouth 2 (two) times daily with a meal. Make sure eats or takes something before using this medicine 03/09/22   Haydee Salter, MD  divalproex (DEPAKOTE) 250 MG DR tablet Take 1 tablet (250 mg total) by mouth 2 (two) times daily. 03/08/22   Vaslow, Acey Lav, MD  furosemide (LASIX) 20 MG tablet Take 1 tablet (20 mg total) by mouth daily. 02/12/22   Haydee Salter, MD  gabapentin (NEURONTIN) 300 MG capsule Take 1 capsule in AM, 1 capsule in PM, 2 capsules at bedtime 03/09/22   Cameron Sprang, MD  MELATONIN GUMMIES PO Take 10 mg by mouth.    [provider]   mirabegron ER (MYRBETRIQ) 50 MG TB24 tablet Take 1 tablet (50 mg total) by mouth daily. 03/09/22   Haydee Salter, MD  pantoprazole (PROTONIX) 40 MG tablet Take 1 tablet (40 mg total) by mouth daily. 03/09/22   Haydee Salter, MD  sulfamethoxazole-trimethoprim (BACTRIM DS) 800-160 MG tablet Take 1 tablet by mouth 2 (two) times daily. 03/09/22   Haydee Salter, MD  thiamine 100 MG tablet Take 1 tablet (100 mg total) by mouth daily. 02/02/22   Nita Sells, MD      Allergies    Dilaudid [hydromorphone hcl], Morphine and related, Penicillins, and Tramadol    Review of Systems   Review of Systems  Gastrointestinal:  Positive for abdominal pain.    Physical Exam Updated Vital Signs BP (!) 166/98 (BP Location: Left Arm)   Pulse 65   Temp (!) 97.5 F (36.4 C) (Oral)   Resp 18   Ht 5' 5.5" (1.664 m)   Wt (!) 143 kg   SpO2 100%   BMI 51.66 kg/m  Physical Exam Vitals and nursing note reviewed.  Cardiovascular:     Rate and Rhythm: Normal rate and regular rhythm.  Pulmonary:     Breath sounds: No wheezing or rhonchi.  Abdominal:     Tenderness: There is abdominal  tenderness.     Comments: Lower abdominal tenderness without rebound or guarding.  No hernia palpated.  Does have a fullness in the suprapubic area.,  This however appeared to improve somewhat with her laying down more.  Skin:    General: Skin is warm.  Neurological:     Mental Status: She is alert.   Pitting edema bilateral lower extremities.  ED Results / Procedures / Treatments   Labs (all labs ordered are listed, but only abnormal results are displayed) Labs Reviewed  CBC WITH DIFFERENTIAL/PLATELET - Abnormal; Notable for the following components:      Result Value   RBC 3.74 (*)    Hemoglobin 10.9 (*)    HCT 34.4 (*)    All other components within normal limits  URINE CULTURE  COMPREHENSIVE METABOLIC PANEL  LIPASE, BLOOD  URINALYSIS, ROUTINE W REFLEX MICROSCOPIC    EKG None  Radiology No  results found.  Procedures Procedures    Medications Ordered in ED Medications  acetaminophen (TYLENOL) tablet 500 mg (500 mg Oral Given 04/02/22 2146)    ED Course/ Medical Decision Making/ A&P                           Medical Decision Making Risk OTC drugs.   Patient presents with abdominal pain.  Upper abdomen.  Recent CT scan reviewed and showed constipation.  Patient previous metastatic disease to brain.  Also dysuria.  Urinalysis reassuring today blood work reassuring.  Doubt severe infection.  Differential diagnosis did include obstruction, UTI, mass.  After workup I think most likely constipation that showed up on CT scan but could be doing some of the pain.  Does not have urinary retention and urine did not show infection.  Currently do not feel spine need to repeat the CT scan.  Will discharge home.        Final Clinical Impression(s) / ED Diagnoses Final diagnoses:  Lower abdominal pain  Constipation, unspecified constipation type    Rx / DC Orders ED Discharge Orders          Ordered    polyethylene glycol (MIRALAX / GLYCOLAX) 17 g packet  Daily PRN        04/02/22 2212              Davonna Belling, MD 04/02/22 2341

## 2022-04-02 NOTE — ED Notes (Signed)
Pt given female urinal and attempting to urinate.

## 2022-04-03 ENCOUNTER — Inpatient Hospital Stay: Payer: Medicare Other | Attending: Internal Medicine

## 2022-04-03 ENCOUNTER — Other Ambulatory Visit: Payer: Self-pay

## 2022-04-03 ENCOUNTER — Other Ambulatory Visit: Payer: Self-pay | Admitting: *Deleted

## 2022-04-03 ENCOUNTER — Inpatient Hospital Stay: Payer: Medicare Other

## 2022-04-03 ENCOUNTER — Inpatient Hospital Stay (HOSPITAL_BASED_OUTPATIENT_CLINIC_OR_DEPARTMENT_OTHER): Payer: Medicare Other | Admitting: Hematology and Oncology

## 2022-04-03 DIAGNOSIS — C7931 Secondary malignant neoplasm of brain: Secondary | ICD-10-CM | POA: Insufficient documentation

## 2022-04-03 DIAGNOSIS — Z5112 Encounter for antineoplastic immunotherapy: Secondary | ICD-10-CM | POA: Insufficient documentation

## 2022-04-03 DIAGNOSIS — C50911 Malignant neoplasm of unspecified site of right female breast: Secondary | ICD-10-CM

## 2022-04-03 DIAGNOSIS — Z923 Personal history of irradiation: Secondary | ICD-10-CM | POA: Insufficient documentation

## 2022-04-03 DIAGNOSIS — Z171 Estrogen receptor negative status [ER-]: Secondary | ICD-10-CM | POA: Diagnosis not present

## 2022-04-03 DIAGNOSIS — Z79899 Other long term (current) drug therapy: Secondary | ICD-10-CM | POA: Insufficient documentation

## 2022-04-03 DIAGNOSIS — Z9013 Acquired absence of bilateral breasts and nipples: Secondary | ICD-10-CM | POA: Insufficient documentation

## 2022-04-03 DIAGNOSIS — Z95828 Presence of other vascular implants and grafts: Secondary | ICD-10-CM

## 2022-04-03 LAB — CBC WITH DIFFERENTIAL (CANCER CENTER ONLY)
Abs Immature Granulocytes: 0.02 10*3/uL (ref 0.00–0.07)
Basophils Absolute: 0 10*3/uL (ref 0.0–0.1)
Basophils Relative: 1 %
Eosinophils Absolute: 0.1 10*3/uL (ref 0.0–0.5)
Eosinophils Relative: 3 %
HCT: 32.7 % — ABNORMAL LOW (ref 36.0–46.0)
Hemoglobin: 10.7 g/dL — ABNORMAL LOW (ref 12.0–15.0)
Immature Granulocytes: 1 %
Lymphocytes Relative: 26 %
Lymphs Abs: 0.8 10*3/uL (ref 0.7–4.0)
MCH: 29.2 pg (ref 26.0–34.0)
MCHC: 32.7 g/dL (ref 30.0–36.0)
MCV: 89.3 fL (ref 80.0–100.0)
Monocytes Absolute: 0.7 10*3/uL (ref 0.1–1.0)
Monocytes Relative: 21 %
Neutro Abs: 1.5 10*3/uL — ABNORMAL LOW (ref 1.7–7.7)
Neutrophils Relative %: 48 %
Platelet Count: 197 10*3/uL (ref 150–400)
RBC: 3.66 MIL/uL — ABNORMAL LOW (ref 3.87–5.11)
RDW: 13.4 % (ref 11.5–15.5)
WBC Count: 3.2 10*3/uL — ABNORMAL LOW (ref 4.0–10.5)
nRBC: 0 % (ref 0.0–0.2)

## 2022-04-03 LAB — CMP (CANCER CENTER ONLY)
ALT: 6 U/L (ref 0–44)
AST: 11 U/L — ABNORMAL LOW (ref 15–41)
Albumin: 4 g/dL (ref 3.5–5.0)
Alkaline Phosphatase: 70 U/L (ref 38–126)
Anion gap: 4 — ABNORMAL LOW (ref 5–15)
BUN: 13 mg/dL (ref 6–20)
CO2: 32 mmol/L (ref 22–32)
Calcium: 8.8 mg/dL — ABNORMAL LOW (ref 8.9–10.3)
Chloride: 102 mmol/L (ref 98–111)
Creatinine: 0.87 mg/dL (ref 0.44–1.00)
GFR, Estimated: >60 mL/min (ref >60)
Glucose, Bld: 89 mg/dL (ref 70–99)
Potassium: 3.7 mmol/L (ref 3.5–5.1)
Sodium: 138 mmol/L (ref 135–145)
Total Bilirubin: 0.8 mg/dL (ref 0.3–1.2)
Total Protein: 7.7 g/dL (ref 6.5–8.1)

## 2022-04-03 MED ORDER — ACETAMINOPHEN 325 MG PO TABS
650.0000 mg | ORAL_TABLET | Freq: Once | ORAL | Status: AC
  Administered 2022-04-03: 650 mg via ORAL
  Filled 2022-04-03: qty 2

## 2022-04-03 MED ORDER — DIPHENHYDRAMINE HCL 25 MG PO CAPS
50.0000 mg | ORAL_CAPSULE | Freq: Once | ORAL | Status: AC
  Administered 2022-04-03: 50 mg via ORAL
  Filled 2022-04-03: qty 2

## 2022-04-03 MED ORDER — SODIUM CHLORIDE 0.9% FLUSH: 10.0000 mL | INTRAVENOUS | Status: DC | PRN

## 2022-04-03 MED ORDER — SODIUM CHLORIDE 0.9% FLUSH
10.0000 mL | INTRAVENOUS | Status: DC | PRN
  Administered 2022-04-03: 10 mL

## 2022-04-03 MED ORDER — SODIUM CHLORIDE 0.9 % IV SOLN: Freq: Once | INTRAVENOUS | Status: AC

## 2022-04-03 MED ORDER — HEPARIN SOD (PORK) LOCK FLUSH 100 UNIT/ML IV SOLN: 500.0000 [IU] | Freq: Once | INTRAVENOUS | Status: DC | PRN

## 2022-04-03 MED ORDER — TRASTUZUMAB-DKST CHEMO 150 MG IV SOLR
6.0000 mg/kg | Freq: Once | INTRAVENOUS | Status: AC
  Administered 2022-04-03: 777 mg via INTRAVENOUS
  Filled 2022-04-03: qty 37

## 2022-04-03 MED ORDER — TRAMADOL HCL 50 MG PO TABS: 50.0000 mg | ORAL_TABLET | Freq: Two times a day (BID) | ORAL | 0 refills | Status: DC

## 2022-04-03 NOTE — Assessment & Plan Note (Addendum)
Moved to [facility] from [place] [employer]), in 02/2020 to live with her parents and son near her sister. 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  Current treatment:Herceptin maintenance every 4 weeks started 11/21/2020 Herceptin toxicities: None  CT chest abdomen pelvis2/08/2021: No new or progressive findings to suggest recurrent or metastatic disease Brain MRI1/31/2022 (confusion as well as frequent falls.): No intracranial abnormalities Brain MRI 11/28/2021: No change   CT CAP 03/29/2022: Stable examination without any progression.  Cholelithiasis.  Hospitalization 01/28/2022-02/02/2022: Seizures: Recommendation of taking higher doses of gabapentin along with Depakote  Severe pain in the feet and left chest wall: Previously she was on tramadol which appeared to be helping.  I sent a new prescription for that.  We will consult with palliative care.  Return to clinic for Herceptin

## 2022-04-03 NOTE — Progress Notes (Signed)
Per MD request, RN placed referral to [facility] Palliative care.

## 2022-04-03 NOTE — Patient Instructions (Signed)
[facility] MEDICAL [facility]COLOGY  Discharge Instructions: Thank you for choosing [facility] to provide your oncology and hematology care.   If you have a lab appointment with the Cancer Center, please go directly to the Cancer Center and check in at the registration area.   Wear comfortable clothing and clothing appropriate for easy access to any Portacath or PICC line.   We strive to give you quality time with your provider. You may need to reschedule your appointment if you arrive late (15 or more minutes).  Arriving late affects you and other patients whose appointments are after yours.  Also, if you miss three or more appointments without notifying the office, you may be dismissed from the clinic at the provider's discretion.      For prescription refill requests, have your pharmacy contact our office and allow 72 hours for refills to be completed.    Today you received the following chemotherapy and/or immunotherapy agents: Trastuzumab.       To help prevent nausea and vomiting after your treatment, we encourage you to take your nausea medication as directed.  BELOW ARE SYMPTOMS THAT SHOULD BE REPORTED IMMEDIATELY: *FEVER GREATER THAN 100.4 F (38 C) OR HIGHER *CHILLS OR SWEATING *NAUSEA AND VOMITING THAT IS NOT CONTROLLED WITH YOUR NAUSEA MEDICATION *UNUSUAL SHORTNESS OF BREATH *UNUSUAL BRUISING OR BLEEDING *URINARY PROBLEMS (pain or burning when urinating, or frequent urination) *BOWEL PROBLEMS (unusual diarrhea, constipation, pain near the anus) TENDERNESS IN MOUTH AND THROAT WITH OR WITHOUT PRESENCE OF ULCERS (sore throat, sores in mouth, or a toothache) UNUSUAL RASH, SWELLING OR PAIN  UNUSUAL VAGINAL DISCHARGE OR ITCHING   Items with * indicate a potential emergency and should be followed up as soon as possible or go to the Emergency Department if any problems should occur.  Please show the CHEMOTHERAPY ALERT CARD or IMMUNOTHERAPY ALERT CARD at check-in  to the Emergency Department and triage nurse.  Should you have questions after your visit or need to cancel or reschedule your appointment, please contact [facility]  Dept: [phone]  and follow the prompts.  Office hours are 8:00 a.m. to 4:30 p.m. Monday - Friday. Please note that voicemails left after 4:00 p.m. may not be returned until the following business day.  We are closed weekends and major holidays. You have access to a nurse at all times for urgent questions. Please call the main number to the clinic Dept: [phone] and follow the prompts.   For any non-urgent questions, you may also contact your provider using MyChart. We now offer e-Visits for anyone 18 and older to request care online for non-urgent symptoms. For details visit [email or web address].   Also download the MyChart app! Go to the app store, search "MyChart", open the app, select [facility], and log in with your MyChart username and password.  Masks are optional in the cancer centers. If you would like for your care team to wear a mask while they are taking care of you, please let them know. You may have one support person who is at least 55 years old accompany you for your appointments. 

## 2022-04-04 LAB — URINE CULTURE

## 2022-04-05 ENCOUNTER — Telehealth: Payer: Self-pay

## 2022-04-05 NOTE — Telephone Encounter (Signed)
Transition Care Management Follow-up Telephone Call Date of discharge and from where: 04/03/2022 How have you been since you were released from the hospital? no Any questions or concerns? No  Items Reviewed: Did the pt receive and understand the discharge instructions provided? Yes  Medications obtained and verified?  N/a Other? No  Any new allergies since your discharge? No  Dietary orders reviewed? No Do you have support at home? No   Home Care and Equipment/Supplies: Were home health services ordered? no If so, what is the name of the agency? N/a  Has the agency set up a time to come to the patient's home? not applicable Were any new equipment or medical supplies ordered?  No What is the name of the medical supply agency? N/a Were you able to get the supplies/equipment? not applicable Do you have any questions related to the use of the equipment or supplies? No  Functional Questionnaire: (I = Independent and D = Dependent) ADLs: D  Bathing/Dressing- D  Meal Prep- D  Eating- I  Maintaining continence- D  Transferring/Ambulation- D  Managing Meds- D  Follow up appointments reviewed:  PCP Hospital f/u appt confirmed? No  Scheduled to see n/a on n/a @ n/a. Daughter states she will call back to schedule hospital f/u. She does not want to schedule at the moment without Ms. Clowers's other appointments list in front of her and create a scheduling conflict. She states she has the number to [facility]. [facility] f/u appt confirmed? No  Scheduled to see n/a on n/a @ n/a. Are transportation arrangements needed? No  If their condition worsens, is the pt aware to call PCP or go to the Emergency Dept.? Yes Was the patient provided with contact information for the PCP's office or ED? Yes Was to pt encouraged to call back with questions or concerns? Yes

## 2022-04-05 NOTE — Telephone Encounter (Addendum)
Called patient's sister, [name], due to not getting a return call from [facility], patient is feeling better and ended up going to hospital that day for a couple hours.  Was advised to f/u with PCP and will schedule that as soon as things get better with mother.  Dm/cma

## 2022-04-06 ENCOUNTER — Ambulatory Visit (INDEPENDENT_AMBULATORY_CARE_PROVIDER_SITE_OTHER): Payer: Medicare Other | Admitting: Podiatrist

## 2022-04-06 ENCOUNTER — Other Ambulatory Visit: Payer: Self-pay | Admitting: Podiatrist

## 2022-04-06 ENCOUNTER — Encounter: Payer: Self-pay | Admitting: Podiatrist

## 2022-04-06 DIAGNOSIS — M79674 Pain in right toe(s): Secondary | ICD-10-CM | POA: Diagnosis not present

## 2022-04-06 DIAGNOSIS — B351 Tinea unguium: Secondary | ICD-10-CM

## 2022-04-06 DIAGNOSIS — T451X5A Adverse effect of antineoplastic and immunosuppressive drugs, initial encounter: Secondary | ICD-10-CM

## 2022-04-06 DIAGNOSIS — M79675 Pain in left toe(s): Secondary | ICD-10-CM | POA: Diagnosis not present

## 2022-04-06 DIAGNOSIS — G620 Drug-induced polyneuropathy: Secondary | ICD-10-CM | POA: Diagnosis not present

## 2022-04-06 MED ORDER — KETOCONAZOLE 2 % EX CREA: TOPICAL_CREAM | CUTANEOUS | 2 refills | Status: DC

## 2022-04-06 NOTE — Progress Notes (Signed)
Chief Complaint  Patient presents with   Debridement    Trim toenails/calluses     HPI: Patient is 56 y.o. female who presents today for painful toenails.  She also has small corns on her fifth toes which have gotten better recently.  She does have a concern for swelling in her ankles that was mentioned during the exam-  she is happy to come back to have that looked at more extensively which was suggested.    Patient Active Problem List   Diagnosis Date Noted   Metabolic encephalopathy 56/43/3295   Cholelithiasis 10/16/2021   Uterine fibroid 10/16/2021   Aortic atherosclerosis ([place]) 10/16/2021   Chemotherapy-induced neuropathy ([place]) 09/11/2021   Localization-related (focal) (partial) symptomatic epilepsy and epileptic syndromes with complex partial seizures, not intractable, without status epilepticus ([place]) 09/11/2021   Essential hypertension 05/30/2021   Acute encephalopathy 05/19/2021   Port-A-Cath in place 01/16/2021   Migraine without aura 12/19/2020   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[place]) 09/22/2020   Gastroesophageal reflux disease without esophagitis 06/29/2020   Other chronic pain 06/29/2020   Moderate episode of recurrent major depressive disorder ([place]) 06/29/2020   Urinary incontinence 06/29/2020    Current Outpatient Medications on File Prior to Visit  Medication Sig Dispense Refill   amLODipine (NORVASC) 5 MG tablet TAKE 1 TABLET (5 MG TOTAL) BY MOUTH DAILY. 90 tablet 3   diclofenac (VOLTAREN) 50 MG EC tablet Take 1 tablet (50 mg total) by mouth 2 (two) times daily with a meal. Make sure eats or takes something before using this medicine 30 tablet 1   divalproex (DEPAKOTE) 250 MG DR tablet Take 1 tablet (250 mg total) by mouth 2 (two) times daily. 60 tablet 1   furosemide (LASIX) 20 MG tablet Take 1 tablet (20 mg total) by mouth daily. 30 tablet 3   gabapentin (NEURONTIN) 300 MG capsule Take 1 capsule in AM, 1 capsule in PM, 2 capsules at bedtime 360 capsule  3   MELATONIN GUMMIES PO Take 10 mg by mouth.     mirabegron ER (MYRBETRIQ) 50 MG TB24 tablet Take 1 tablet (50 mg total) by mouth daily. 90 tablet 1   pantoprazole (PROTONIX) 40 MG tablet Take 1 tablet (40 mg total) by mouth daily. 30 tablet 0   polyethylene glycol (MIRALAX / GLYCOLAX) 17 g packet Take 17 g by mouth daily as needed for moderate constipation. 14 each 0   thiamine 100 MG tablet Take 1 tablet (100 mg total) by mouth daily. 30 tablet 11   traMADol (ULTRAM) 50 MG tablet Take 1 tablet (50 mg total) by mouth 2 (two) times daily. 60 tablet 0   No current facility-administered medications on file prior to visit.    Allergies  Allergen Reactions   Dilaudid [Hydromorphone Hcl] Other (See Comments)    Confusion & hallucinations   Morphine And Related Other (See Comments)    HA   Penicillins    Tramadol     Review of Systems No fevers, chills, nausea, muscle aches, no difficulty breathing, no calf pain, no chest pain or shortness of breath.   Physical Exam  Vascular Capillary fill time to digits <3 seconds b/l lower extremities. Palpable DP pulse(s) b/l lower extremities Faintly palpable PT pulse(s) b/l lower extremities. Pedal hair absent. Lower extremity skin temperature gradient within normal limits. No pain with calf compression b/l. Edema of bilateral feet and ankles is noted. Non pitting.  Neurologic Pt has subjective symptoms of neuropathy. Protective sensation decreased with 10  gram monofilament b/l. Vibratory sensation intact b/l. Dermatologic Toenails 1-5 b/l elongated, discolored, dystrophic, thickened, crumbly with subungual debris and tenderness to dorsal palpation. Hyperkeratotic lesion(s) L 5th toe and R 5th toe.  No erythema, no edema, no drainage, no fluctuance. Her corns have improved and are smaller and superficial at todays visit.  Orthopedic: Pes planus foot type noted.  Normal muscle strength 5/5 to all lower extremity muscle groups bilaterally. Pain  bilateral ankles and sinus tarsi region noted bilateral.  Hammertoe(s) noted to the L 5th toe and R 5th toe.   Assessment:     ICD-10-CM   1. Pain due to onychomycosis of toenails of both feet  B35.1    M79.675    M79.674     2. Chemotherapy-induced neuropathy ([place])  G62.0    T45.1X5A      Plan:    Discussed exam findings and treatment recommendations.  At today's visit I did debride the toenails in thickness and length with a sterile nail nipper and a sterile power bur.  Also recommended she be seen back for evaluation of ankle pain where further evaluation and x-rays can be taken and the patient will make an appointment for this in the future.  She will be seen back in 3 months for continued care her toe nails

## 2022-04-07 ENCOUNTER — Other Ambulatory Visit: Payer: Self-pay | Admitting: Family Medicine

## 2022-04-07 DIAGNOSIS — K219 Gastro-esophageal reflux disease without esophagitis: Secondary | ICD-10-CM

## 2022-04-09 ENCOUNTER — Telehealth: Payer: Self-pay | Admitting: Family Medicine

## 2022-04-09 NOTE — Telephone Encounter (Signed)
Caller Name: [place] Call back phone #: [phone]  Reason for Call: Requested verbal order for home PT once a week for 4 weeks. Wanted to note that feet have been swelling with pain 5/10.

## 2022-04-10 NOTE — Telephone Encounter (Signed)
NA

## 2022-04-11 NOTE — Telephone Encounter (Signed)
Left patient a detailed voice message regarding annotation below and to call office for further assistance.

## 2022-04-13 ENCOUNTER — Encounter: Payer: Self-pay | Admitting: Family Medicine

## 2022-04-13 ENCOUNTER — Ambulatory Visit (INDEPENDENT_AMBULATORY_CARE_PROVIDER_SITE_OTHER): Payer: Medicare Other | Admitting: Family Medicine

## 2022-04-13 VITALS — BP 119/84 | HR 70 | Temp 96.2°F | Wt 329.2 lb

## 2022-04-13 DIAGNOSIS — R600 Localized edema: Secondary | ICD-10-CM

## 2022-04-13 DIAGNOSIS — F331 Major depressive disorder, recurrent, moderate: Secondary | ICD-10-CM | POA: Diagnosis not present

## 2022-04-13 DIAGNOSIS — R300 Dysuria: Secondary | ICD-10-CM

## 2022-04-13 MED ORDER — VENLAFAXINE HCL ER 37.5 MG PO CP24: 37.5000 mg | ORAL_CAPSULE | Freq: Every day | ORAL | 5 refills | Status: DC

## 2022-04-13 MED ORDER — FUROSEMIDE 40 MG PO TABS: 40.0000 mg | ORAL_TABLET | Freq: Every day | ORAL | 3 refills | Status: DC

## 2022-04-13 NOTE — Progress Notes (Signed)
[facility] [address] [place] [address] Dept: [phone] Dept Fax: [phone]  Chronic Care Office Visit  Subjective:    Patient ID: Monica Mccoy, female    DOB: 02-27-1966, 56 y.o..   MRN: [id number]  Chief Complaint  Patient presents with   [facility] f/u for lower abdominal pain. Also ankles and feet swelling. Not fasting.    History of Present Illness:  Patient is in today for reassessment of chronic medical issues.  Monica Mccoy was seen at the [facility] on 8/7 with lower abdominal pain. She had lab work and an abdominal CT performed. She notes that she is not necessarily having LLQ pain currently, but is still experiencing some dysuria/burning sensation associated with urination. She denies any vaginal discharge or itching. She is post-menopausal.  Monica Mccoy has also had recent issues with worsening swelling in her lower legs. She is currently managed on furosemide 20 mg daily. She had a recent echocardiogram, which did not show evidence of heart failure.  Monica Mccoy has a history of depression. Her mood has been doing more poorly of late. She finds herself feeling generally depressed. Her sleep is not good according to her sister, Monica Mccoy. She has fluctuating appetite, between binging and then not feeling like eating. She also isolates herself at home and only tends to get out when her physical therapist is around.  Past Medical History: Patient Active Problem List   Diagnosis Date Noted   Pedal edema 29/51/8841   Metabolic encephalopathy 66/01/3015   Cholelithiasis 10/16/2021   Uterine fibroid 10/16/2021   Aortic atherosclerosis ([place]) 10/16/2021   Chemotherapy-induced neuropathy ([place]) 09/11/2021   Localization-related (focal) (partial) symptomatic epilepsy and epileptic syndromes with complex partial seizures, not intractable, without status epilepticus ([place]) 09/11/2021   Essential hypertension  05/30/2021   Acute encephalopathy 05/19/2021   Port-A-Cath in place 01/16/2021   Migraine without aura 12/19/2020   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[place]) 09/22/2020   Gastroesophageal reflux disease without esophagitis 06/29/2020   Other chronic pain 06/29/2020   Moderate episode of recurrent major depressive disorder ([place]) 06/29/2020   Urinary incontinence 06/29/2020   Past Surgical History:  Procedure Laterality Date   BRAIN SURGERY     brain tumor   MASTECTOMY Bilateral    ORIF ANKLE FRACTURE Right    Family History  Problem Relation Age of Onset   Obesity Mother    Hypertension Mother    Hyperlipidemia Mother    Diabetes Mother    Heart disease Mother    Outpatient Medications Prior to Visit  Medication Sig Dispense Refill   amLODipine (NORVASC) 5 MG tablet TAKE 1 TABLET (5 MG TOTAL) BY MOUTH DAILY. 90 tablet 3   diclofenac (VOLTAREN) 50 MG EC tablet Take 1 tablet (50 mg total) by mouth 2 (two) times daily with a meal. Make sure eats or takes something before using this medicine 30 tablet 1   divalproex (DEPAKOTE) 250 MG DR tablet Take 1 tablet (250 mg total) by mouth 2 (two) times daily. 60 tablet 1   gabapentin (NEURONTIN) 300 MG capsule Take 1 capsule in AM, 1 capsule in PM, 2 capsules at bedtime 360 capsule 3   ketoconazole (NIZORAL) 2 % cream Apply to bottoms of feet twice daily 60 g 2   MELATONIN GUMMIES PO Take 10 mg by mouth.     mirabegron ER (MYRBETRIQ) 50 MG TB24 tablet Take 1 tablet (50 mg total) by mouth daily. [place]  tablet 1   pantoprazole (PROTONIX) 40 MG tablet TAKE 1 TABLET BY MOUTH EVERY DAY 90 tablet 3   polyethylene glycol (MIRALAX / GLYCOLAX) 17 g packet Take 17 g by mouth daily as needed for moderate constipation. 14 each 0   thiamine 100 MG tablet Take 1 tablet (100 mg total) by mouth daily. 30 tablet 11   traMADol (ULTRAM) 50 MG tablet Take 1 tablet (50 mg total) by mouth 2 (two) times daily. 60 tablet 0   furosemide (LASIX) 20 MG tablet Take  1 tablet (20 mg total) by mouth daily. 30 tablet 3   No facility-administered medications prior to visit.   Allergies  Allergen Reactions   Dilaudid [Hydromorphone Hcl] Other (See Comments)    Confusion & hallucinations   Morphine And Related Other (See Comments)    HA   Penicillins    Tramadol      Objective:   Today's Vitals   04/13/22 1123  BP: 119/84  Pulse: 70  Temp: (!) 96.2 F (35.7 C)  TempSrc: Temporal  SpO2: 99%  Weight: (!) 329 lb 3.2 oz (149.3 kg)   Body mass index is 53.95 kg/m.   General: Well developed, well nourished. No acute distress. Buttocks: No visible lesion at site of burning sensation. Extremities: 4+ edema of lower legs and feet. Psych: Alert and oriented. Normal mood and affect.  Health Maintenance Due  Topic Date Due   URINE MICROALBUMIN  Never done   MAMMOGRAM  Never done   Hepatitis C Screening  Never done   TETANUS/TDAP  Never done   Zoster Vaccines- Shingrix (1 of 2) Never done   PAP SMEAR-Modifier  Never done   COLONOSCOPY (Pts 45-72yr Insurance coverage will need to be confirmed)  Never done   COVID-19 Vaccine (3 - Moderna risk series) 02/16/2020   Lab Results    Latest Ref Rng & Units 04/03/2022    2:54 PM 04/02/2022    4:38 PM 03/20/2022    6:40 PM  CBC  WBC 4.0 - 10.5 K/uL 3.2  4.1  3.6   Hemoglobin 12.0 - 15.0 g/dL 10.7  10.9  10.1   Hematocrit 36.0 - 46.0 % 32.7  34.4  32.3   Platelets 150 - 400 K/uL 197  207  190       Latest Ref Rng & Units 04/03/2022    2:54 PM 04/02/2022    4:38 PM 03/20/2022    6:40 PM  CMP  Glucose 70 - 99 mg/dL 89  87  97   BUN 6 - 20 mg/dL '13  13  22   '$ Creatinine 0.44 - 1.00 mg/dL 0.87  0.86  1.08   Sodium 135 - 145 mmol/L 138  138  139   Potassium 3.5 - 5.1 mmol/L 3.7  3.9  4.2   Chloride 98 - 111 mmol/L 102  104  104   CO2 22 - 32 mmol/L 32  26  30   Calcium 8.9 - 10.3 mg/dL 8.8  9.1  8.6   Total Protein 6.5 - 8.1 g/dL 7.7  8.1    Total Bilirubin 0.3 - 1.2 mg/dL 0.8  0.8    Alkaline Phos 38  - 126 U/L 70  67    AST 15 - 41 U/L 11  15    ALT 0 - 44 U/L 6  11     Urinalysis    Component Value Date/Time   COLORURINE YELLOW 04/02/2022 2012   APPEARANCEUR CLEAR 04/02/2022 2012  LABSPEC 1.013 04/02/2022 2012   PHURINE 6.0 04/02/2022 2012   GLUCOSEU NEGATIVE 04/02/2022 2012   HGBUR NEGATIVE 04/02/2022 2012   BILIRUBINUR NEGATIVE 04/02/2022 2012   BILIRUBINUR negative 03/09/2022 1332   [place] 04/02/2022 2012   PROTEINUR NEGATIVE 04/02/2022 2012   UROBILINOGEN 0.2 03/09/2022 1332   NITRITE NEGATIVE 04/02/2022 2012   LEUKOCYTESUR NEGATIVE 04/02/2022 2012   Component 11 d ago  Specimen Description URINE, CLEAN CATCH  Performed at [facility], [place] [address] [place], [place] [address]   Special Requests NONE  Performed at [facility], [place] [address] [place], [place] [address]   Culture MULTIPLE SPECIES PRESENT, SUGGEST RECOLLECTION Abnormal    Report Status 04/04/2022 FINAL    Imaging: CT of Chest, Abdomen, and Pelvis w contrast (03/29/2022) IMPRESSION: 1. Stable examination without new or progressive findings to suggest recurrent or metastatic disease in the chest, abdomen or pelvis. 2. Cholelithiasis without findings of acute cholecystitis. 3. Moderate volume of formed stool throughout the colon suggestive of constipation. 4.  Aortic Atherosclerosis (ICD10-I70.0).  Echocardiogram (02/26/2022) IMPRESSIONS   1. Left ventricular ejection fraction, by estimation, is 55 to 60%. Left ventricular ejection fraction by 3D volume is 56 %. The left ventricle has normal function. The left ventricle has no regional wall motion  abnormalities. There is mild left ventricular hypertrophy. Left ventricular diastolic parameters are  consistent with Grade I diastolic dysfunction (impaired relaxation). The average left ventricular global longitudinal strain is -18.0 %. The global longitudinal strain is normal.   2. Right ventricular systolic  function is normal. The right ventricular size is normal. There is normal pulmonary artery systolic pressure. The estimated right ventricular systolic pressure is 70.0 mmHg.   3. The mitral valve is normal in structure. Mild mitral valve regurgitation. No evidence of mitral stenosis.   4. The aortic valve is normal in structure. Aortic valve regurgitation is not visualized. No aortic stenosis is present.   5. The inferior vena cava is dilated in size with <50% respiratory variability, suggesting right atrial pressure of 15 mmHg.     Assessment & Plan:   1. Pedal edema Ms. Slovacek has significant swelling today. Her weight is up 15 lbs in the past 10 days. Her edema appears ot be primarily due to venous insufficiency, as she has no evidence of underlying heart, kidney, or liver disease. I will increase her Lasix to 40 mg daily and plan to reassess this at her next visit.   - furosemide (LASIX) 40 MG tablet; Take 1 tablet (40 mg total) by mouth daily.  Dispense: 90 tablet; Refill: 3  2. Moderate episode of recurrent major depressive disorder [facility]) Ms. Ringel is not currently on medication for her depression. I will plan to start her on venlafaxine and reassess in 6 weeks.  - venlafaxine XR (EFFEXOR XR) 37.5 MG 24 hr capsule; Take 1 capsule (37.5 mg total) by mouth daily with breakfast.  Dispense: 30 capsule; Refill: 5  3. 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 As she has had breast cancer, I would plan to avoid using estrogen. I discussed using a vaginal moisturizer to see if this will reduce symptoms. I provided her a list of options and recommend she use this 3 times a week.  Return in about 6 weeks (around 05/25/2022).   Haydee Salter, MD

## 2022-04-13 NOTE — Patient Instructions (Signed)
Use Vaginal moisturizer at least 3 times a week.   Products to choose from Yes Vaginal Moisturiser  Sylk Natural Intimate Moisturiser  Hyalofemme Vaginal Hydrating Gel Replens MD Longer-Lasting Vaginal Moisturizer Regelle Long-Lasting Vaginal Moisturiser

## 2022-04-17 ENCOUNTER — Ambulatory Visit: Payer: Medicare Other

## 2022-04-20 ENCOUNTER — Encounter: Payer: Self-pay | Admitting: Podiatrist

## 2022-04-20 ENCOUNTER — Ambulatory Visit (INDEPENDENT_AMBULATORY_CARE_PROVIDER_SITE_OTHER): Payer: Medicare Other

## 2022-04-20 ENCOUNTER — Ambulatory Visit (INDEPENDENT_AMBULATORY_CARE_PROVIDER_SITE_OTHER): Payer: Medicare Other | Admitting: Podiatrist

## 2022-04-20 DIAGNOSIS — M25571 Pain in right ankle and joints of right foot: Secondary | ICD-10-CM | POA: Diagnosis not present

## 2022-04-20 DIAGNOSIS — M7750 Other enthesopathy of unspecified foot: Secondary | ICD-10-CM

## 2022-04-20 DIAGNOSIS — M25473 Effusion, unspecified ankle: Secondary | ICD-10-CM | POA: Diagnosis not present

## 2022-04-20 DIAGNOSIS — M25472 Effusion, left ankle: Secondary | ICD-10-CM | POA: Diagnosis not present

## 2022-04-20 MED ORDER — TRIAMCINOLONE ACETONIDE 10 MG/ML IJ SUSP
10.0000 mg | Freq: Once | INTRAMUSCULAR | Status: AC
  Administered 2022-04-20: 10 mg

## 2022-04-20 NOTE — Progress Notes (Signed)
Chief Complaint  Patient presents with   Foot Swelling    2nd eval for swelling in ankles- has pain for a couple years. Patient had surgery a couple of years ago.      HPI: Patient is 56 y.o. female who presents today for bilateral swelling in her ankles.  She has had pain for the last couple of years she had surgery on the right ankle about 2 years ago for fracture.  She does have plates and screws in place.  She relates the pain is on the medial aspect of both feet.   Allergies  Allergen Reactions   Dilaudid [Hydromorphone Hcl] Other (See Comments)    Confusion & hallucinations   Morphine And Related Other (See Comments)    HA   Penicillins    Tramadol     Review of systems is negative except as noted in the HPI.  Denies nausea/ vomiting/ fevers/ chills or night sweats.   Denies difficulty breathing, denies calf pain or tenderness  Physical Exam  Patient is awake, alert, and oriented x 3.  In no acute distress.    Vascular status is intact with palpable pedal pulses DP and PT bilateral and capillary refill time less than 3 seconds bilateral.  edema to bilateral feet is noted with right being more severe than left.  Neurological exam reveals epicritic and protective sensation grossly intact bilateral. Subjective symptoms of neuropathy present.   Dermatological no open lesions, no ulcerations are present.  Xerotic skin is noted.  A symptomatic, dystrophic toenails 1 through 5 bilateral noted  Musculoskeletal exam: Pain on palpation along the posterior tibial tendon at its insertion and proximal along its course to just behind the medial malleolus is noted.  Some pain with inversion against resistance noted.  No pain with dorsiflexion plantarflexion or eversion. Some pain sinus tarsi bilateral noted.  Pes planus foot type is noted.  Pain along the plantar medial heel is also noted bilateral. Hammertoes bilateral 5th digits noted.    X-ray evaluation 3 views of bilateral feet/ankles  are obtained.  Arthritic changes of the right ankle along with plate and screw fixation noted.  Syndesmotic screw is in place.  No sign of acute osseous or joint abnormalities noted right.  Left ankle views show no sign of fracture or dislocation.  No acute osseous or joint abnormalities are seen.  Pes planus foot type bilateral noted on x-ray.  Assessment:   ICD-10-CM   1. Ankle swelling, unspecified laterality  M25.473 DG Ankle Complete Left    DG Ankle Complete Right    2. Tendonitis of ankle or foot  M77.50        Plan: Exam and x-ray findings are discussed with the patient and her sister.  At today's visit I diagnosed her with tendinitis and recommended injection therapy, physical therapy and ankle braces.  The patient wishes to proceed I prepped the skin with alcohol and treated 10 mg of Kenalog with Marcaine and lidocaine mix on the plantar medial aspect of bilateral feet being careful to stay away from the tendon or tendon insertion itself.  Ankle braces are dispensed for her use and I will call in a prescription for physical therapy to [facility].  She will be seen back in 6 weeks for a recheck and if any concerns arise prior to that visit she will call.

## 2022-04-24 ENCOUNTER — Inpatient Hospital Stay: Payer: Medicare Other | Admitting: Nurse Practitioner

## 2022-04-25 ENCOUNTER — Other Ambulatory Visit: Payer: Self-pay

## 2022-04-25 ENCOUNTER — Ambulatory Visit: Payer: Medicare Other | Attending: Podiatrist

## 2022-04-25 DIAGNOSIS — M25572 Pain in left ankle and joints of left foot: Secondary | ICD-10-CM | POA: Insufficient documentation

## 2022-04-25 DIAGNOSIS — M7751 Other enthesopathy of right foot: Secondary | ICD-10-CM | POA: Insufficient documentation

## 2022-04-25 DIAGNOSIS — M6281 Muscle weakness (generalized): Secondary | ICD-10-CM | POA: Insufficient documentation

## 2022-04-25 DIAGNOSIS — M25571 Pain in right ankle and joints of right foot: Secondary | ICD-10-CM | POA: Insufficient documentation

## 2022-04-25 DIAGNOSIS — R252 Cramp and spasm: Secondary | ICD-10-CM | POA: Insufficient documentation

## 2022-04-25 DIAGNOSIS — R262 Difficulty in walking, not elsewhere classified: Secondary | ICD-10-CM | POA: Diagnosis present

## 2022-04-25 DIAGNOSIS — M7752 Other enthesopathy of left foot: Secondary | ICD-10-CM | POA: Insufficient documentation

## 2022-04-25 DIAGNOSIS — M7750 Other enthesopathy of unspecified foot: Secondary | ICD-10-CM | POA: Diagnosis not present

## 2022-04-25 DIAGNOSIS — M25672 Stiffness of left ankle, not elsewhere classified: Secondary | ICD-10-CM | POA: Diagnosis present

## 2022-04-25 DIAGNOSIS — M25671 Stiffness of right ankle, not elsewhere classified: Secondary | ICD-10-CM | POA: Insufficient documentation

## 2022-04-25 NOTE — Therapy (Signed)
OUTPATIENT PHYSICAL THERAPY LOWER EXTREMITY EVALUATION   Patient Name: Monica Mccoy MRN: [id number] DOB:1966/07/27, 56 y.o., female Today's Date: 04/25/2022   PT End of Session - 04/25/22 1113     Visit Number 1    Date for PT Re-Evaluation 06/20/22    Authorization Type Medicare A and B    PT Start Time 1102    PT Stop Time 1145    PT Time Calculation (min) 43 min    Activity Tolerance Patient limited by pain    Behavior During Therapy [facility] for tasks assessed/performed             Past Medical History:  Diagnosis Date   Anxiety    Breast cancer metastasized to brain [facility])    Left breast   Chronic pain after cancer treatment    Depression    GERD (gastroesophageal reflux disease)    History of cancer chemotherapy    Breast cancer left breast   Seizure [facility])    Past Surgical History:  Procedure Laterality Date   BRAIN SURGERY     brain tumor   MASTECTOMY Bilateral    ORIF ANKLE FRACTURE Right    Patient Active Problem List   Diagnosis Date Noted   Tendinitis of both ankles 04/25/2022   Pedal edema 37/85/8850   Metabolic encephalopathy 27/74/1287   Cholelithiasis 10/16/2021   Uterine fibroid 10/16/2021   Aortic atherosclerosis ([place]) 10/16/2021   Chemotherapy-induced neuropathy ([place]) 09/11/2021   Localization-related (focal) (partial) symptomatic epilepsy and epileptic syndromes with complex partial seizures, not intractable, without status epilepticus ([place]) 09/11/2021   Essential hypertension 05/30/2021   Acute encephalopathy 05/19/2021   Port-A-Cath in place 01/16/2021   Migraine without aura 12/19/2020   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[place]) 09/22/2020   Gastroesophageal reflux disease without esophagitis 06/29/2020   Other chronic pain 06/29/2020   Moderate episode of recurrent major depressive disorder ([place]) 06/29/2020   Urinary incontinence 06/29/2020    PCP: Haydee Salter, MD  REFERRING PROVIDER: Bronson Ing, DPM   REFERRING  DIAG: M77.50 (ICD-10-CM) - Tendonitis of ankle or foot   THERAPY DIAG:  Pain in right ankle and joints of right foot - Plan: PT plan of care cert/re-cert  Pain in left ankle and joints of left foot - Plan: PT plan of care cert/re-cert  Stiffness of right ankle, not elsewhere classified - Plan: PT plan of care cert/re-cert  Stiffness of left ankle, not elsewhere classified - Plan: PT plan of care cert/re-cert  Tendinitis of both ankles - Plan: PT plan of care cert/re-cert  Difficulty in walking, not elsewhere classified - Plan: PT plan of care cert/re-cert  Muscle weakness (generalized) - Plan: PT plan of care cert/re-cert  Cramp and spasm - Plan: PT plan of care cert/re-cert  Rationale for Evaluation and Treatment Rehabilitation  ONSET DATE: 04/20/2022   SUBJECTIVE:   SUBJECTIVE STATEMENT: Patient reports bilateral ankle and foot pain.  She lives with her parents.  Her Mom takes care of her but is not in the best health either.  Also Dad helps out but per patient, he is out of town sometimes.  Patient reports hx of brain tumor and bilateral breast cancer.  She states her pain is located on either side of both ankles.  She is unable to walk more than 30-50 feet before needing to sit.  She is unable to get in the shower and does sponge baths.  She manages 85% of her ADL's independently.  She is unable to  do any IADL's.    PERTINENT HISTORY: Recent MD notes:  Patient is 56 y.o. female who presents today for bilateral swelling in her ankles.  She has had pain for the last couple of years she had surgery on the right ankle about 2 years ago for fracture.  She does have plates and screws in place.  She relates the pain is on the medial aspect of both feet. Exam and x-ray findings are discussed with the patient and her sister.  At today's visit I diagnosed her with tendinitis and recommended injection therapy, physical therapy and ankle braces.  The patient wishes to proceed I prepped the skin  with alcohol and treated 10 mg of Kenalog with Marcaine and lidocaine mix on the plantar medial aspect of bilateral feet being careful to stay away from the tendon or tendon insertion itself.  Ankle braces are dispensed for her use and I will call in a prescription for physical therapy to [facility].  She will be seen back in 6 weeks for a recheck and if any concerns arise prior to that visit she will call.   PAIN:  Are you having pain? Yes: NPRS scale: 8/10 Pain location: both ankles and feet Pain description: aching Aggravating factors: walking Relieving factors: heat, meds, rest  PRECAUTIONS: None  WEIGHT BEARING RESTRICTIONS No  FALLS:  Has patient fallen in last 6 months? No  LIVING ENVIRONMENT: Lives with:  Mother and Father Lives in: House/apartment Stairs: Yes: Internal: 10 steps; does not do stairs inside Has following equipment at home: Gilford Rile - 2 wheeled  OCCUPATION: disabled  PLOF: Independent, [place] with basic ADLs, Independent with household mobility without device, Independent with community mobility without device, Independent with homemaking with ambulation, Independent with gait, and Independent with transfers  PATIENT GOALS :  walk better   OBJECTIVE:   DIAGNOSTIC FINDINGS:  X-ray evaluation 3 views of bilateral feet/ankles are obtained.  Arthritic changes of the right ankle along with plate and screw fixation noted.       Syndesmotic screw is in place.  No sign of acute osseous or joint abnormalities noted right.  Left ankle views show no sign of fracture or dislocation.  No acute osseous or joint abnormalities are seen.  Pes planus foot type bilateral noted on x-ray.   PATIENT SURVEYS:  LEFS 19/80 FOTO not entered  COGNITION:  Overall cognitive status: Within functional limits for tasks assessed     SENSATION: WFL   POSTURE: rounded shoulders, forward head, and flexed trunk   PALPATION: Tender bilateral ankle stabilizers: peroneals and  posterior tibialis  LOWER EXTREMITY ROM:  Active ROM Right eval Left eval  Ankle dorsiflexion -8 -2  Ankle plantarflexion 32 53  Ankle inversion 10 12  Ankle eversion 6 3   (Blank rows = not tested)  LOWER EXTREMITY MMT:  MMT Right eval Left eval  Ankle dorsiflexion 3+/5 4/5  Ankle plantarflexion 3/5 4+/5  Ankle inversion 3/5 4-/5  Ankle eversion 3/5 4-/5   (Blank rows = not tested)   FUNCTIONAL TESTS:  5 times sit to stand: 1 min, 10.55 sec Timed up and go (TUG): 1 min, 26.71 sec  GAIT: Distance walked: 10 feet Assistive device utilized: Environmental consultant - 2 wheeled Level of assistance: Min A Comments: slow, guarded, unsteady, fearful, short step/shuffle type gait, fwd flexed trunk, min heel strike    TODAY'S TREATMENT: Initial eval completed and initiated HEP   PATIENT EDUCATION:  Education details: Initiated HEP Person educated: Patient Education method: Explanation,  Demonstration, Verbal cues, and Handouts Education comprehension: verbalized understanding, returned demonstration, and verbal cues required   HOME EXERCISE PROGRAM: Instructed patient to do ankle pumps and circles 3 times per day.   ASSESSMENT:  CLINICAL IMPRESSION: Patient is a 56 y.o. female who was seen today for physical therapy evaluation and treatment for bilateral ankle pain.  She presents with decreased ROM, strength and function bilateral LE's along with elevated pain.  She would respond well to skilled therapy for ankle stability training, Active and Passive ROM,  and modalities for pain relief.     OBJECTIVE IMPAIRMENTS Abnormal gait, decreased balance, decreased mobility, difficulty walking, decreased ROM, decreased strength, hypomobility, increased fascial restrictions, increased muscle spasms, impaired flexibility, postural dysfunction, obesity, and pain.   ACTIVITY LIMITATIONS carrying, lifting, bending, standing, squatting, stairs, transfers, and caring for others  PARTICIPATION  LIMITATIONS: meal prep, cleaning, laundry, shopping, community activity, and yard work  [place], Time since onset of injury/illness/exacerbation, and 3+ comorbidities: depression, anxiety, hx cancer (breast and brain)  are also affecting patient's functional outcome.   REHAB POTENTIAL: Good  CLINICAL DECISION MAKING: Evolving/moderate complexity  EVALUATION COMPLEXITY: Moderate   GOALS: Goals reviewed with patient? Yes  SHORT TERM GOALS: Target date: 05/23/2022  Patient will be independent with initial HEP  Baseline: Goal status: INITIAL  2.  Pain report to be no greater than 4/10  Baseline:  Goal status: INITIAL  3.  LEFS score to improve by 3 points Baseline:  Goal status: INITIAL  4.  TUG to improve by 3 sec Baseline:  Goal status: INITIAL   LONG TERM GOALS: Target date: 06/20/2022   Patient to be independent with advanced HEP  Baseline:  Goal status: INITIAL  2.  Patient to report pain no greater than 2/10  Baseline:  Goal status: INITIAL  3.  Patient to be able to ascend and descend steps with reciprocal gait without pain Baseline:  Goal status: INITIAL  4.  Patient to be able to ambulate with normal heel to toe progression Baseline:  Goal status: INITIAL  5.  LEFS score to improve by 5 points Baseline:  Goal status: INITIAL  6.  FOTO to improve to expected outcome Baseline:  Goal status: INITIAL   PLAN: PT FREQUENCY: 2x/week  PT DURATION: 8 weeks  PLANNED INTERVENTIONS: Therapeutic exercises, Therapeutic activity, Neuromuscular re-education, Balance training, Gait training, Patient/Family education, Self Care, Joint mobilization, Stair training, DME instructions, Aquatic Therapy, Dry Needling, Electrical stimulation, Cryotherapy, Moist heat, scar mobilization, Splintting, Taping, Vasopneumatic device, Ultrasound, Ionotophoresis '4mg'$ /ml Dexamethasone, Manual therapy, and Re-evaluation  PLAN FOR NEXT SESSION: Review HEP, Nustep,  begin ankle stabilization training   Isabel Caprice, PT 04/25/2022, 9:43 PM

## 2022-05-01 ENCOUNTER — Ambulatory Visit: Payer: Medicare Other

## 2022-05-01 ENCOUNTER — Inpatient Hospital Stay: Payer: Medicare Other

## 2022-05-02 ENCOUNTER — Telehealth: Payer: Self-pay | Admitting: Family Medicine

## 2022-05-02 NOTE — Telephone Encounter (Signed)
Katie from [facility] is calling to report---Pt has had 2 falls with no injury other than back pain. Her bp is 150/100. She has not taken her meds today, Joellen Jersey is going to call her sister soon for help. Katie's # [phone]

## 2022-05-03 NOTE — Telephone Encounter (Signed)
Lft VM to rtn call. Dm/cma  

## 2022-05-07 ENCOUNTER — Inpatient Hospital Stay: Payer: Medicare Other | Attending: Internal Medicine

## 2022-05-07 ENCOUNTER — Other Ambulatory Visit: Payer: Self-pay

## 2022-05-07 ENCOUNTER — Other Ambulatory Visit: Payer: Self-pay | Admitting: Hematology and Oncology

## 2022-05-07 ENCOUNTER — Inpatient Hospital Stay: Payer: Medicare Other

## 2022-05-07 ENCOUNTER — Encounter: Payer: Self-pay | Admitting: Nurse Practitioner

## 2022-05-07 ENCOUNTER — Inpatient Hospital Stay (HOSPITAL_BASED_OUTPATIENT_CLINIC_OR_DEPARTMENT_OTHER): Payer: Medicare Other | Admitting: Nurse Practitioner

## 2022-05-07 VITALS — BP 134/89 | HR 73 | Temp 97.8°F | Resp 18 | Wt 326.2 lb

## 2022-05-07 DIAGNOSIS — Z5112 Encounter for antineoplastic immunotherapy: Secondary | ICD-10-CM | POA: Insufficient documentation

## 2022-05-07 DIAGNOSIS — C50911 Malignant neoplasm of unspecified site of right female breast: Secondary | ICD-10-CM

## 2022-05-07 DIAGNOSIS — K5900 Constipation, unspecified: Secondary | ICD-10-CM | POA: Diagnosis not present

## 2022-05-07 DIAGNOSIS — K5903 Drug induced constipation: Secondary | ICD-10-CM | POA: Diagnosis not present

## 2022-05-07 DIAGNOSIS — G629 Polyneuropathy, unspecified: Secondary | ICD-10-CM | POA: Diagnosis not present

## 2022-05-07 DIAGNOSIS — Z9013 Acquired absence of bilateral breasts and nipples: Secondary | ICD-10-CM | POA: Diagnosis not present

## 2022-05-07 DIAGNOSIS — C7931 Secondary malignant neoplasm of brain: Secondary | ICD-10-CM | POA: Diagnosis present

## 2022-05-07 DIAGNOSIS — G893 Neoplasm related pain (acute) (chronic): Secondary | ICD-10-CM

## 2022-05-07 DIAGNOSIS — T451X5A Adverse effect of antineoplastic and immunosuppressive drugs, initial encounter: Secondary | ICD-10-CM

## 2022-05-07 DIAGNOSIS — Z515 Encounter for palliative care: Secondary | ICD-10-CM

## 2022-05-07 DIAGNOSIS — G620 Drug-induced polyneuropathy: Secondary | ICD-10-CM

## 2022-05-07 DIAGNOSIS — Z171 Estrogen receptor negative status [ER-]: Secondary | ICD-10-CM | POA: Insufficient documentation

## 2022-05-07 DIAGNOSIS — Z95828 Presence of other vascular implants and grafts: Secondary | ICD-10-CM

## 2022-05-07 DIAGNOSIS — Z79899 Other long term (current) drug therapy: Secondary | ICD-10-CM | POA: Diagnosis not present

## 2022-05-07 LAB — CBC WITH DIFFERENTIAL (CANCER CENTER ONLY)
Abs Immature Granulocytes: 0.01 10*3/uL (ref 0.00–0.07)
Basophils Absolute: 0 10*3/uL (ref 0.0–0.1)
Basophils Relative: 1 %
Eosinophils Absolute: 0.1 10*3/uL (ref 0.0–0.5)
Eosinophils Relative: 3 %
HCT: 33.2 % — ABNORMAL LOW (ref 36.0–46.0)
Hemoglobin: 10.6 g/dL — ABNORMAL LOW (ref 12.0–15.0)
Immature Granulocytes: 0 %
Lymphocytes Relative: 26 %
Lymphs Abs: 0.9 10*3/uL (ref 0.7–4.0)
MCH: 28.6 pg (ref 26.0–34.0)
MCHC: 31.9 g/dL (ref 30.0–36.0)
MCV: 89.7 fL (ref 80.0–100.0)
Monocytes Absolute: 0.6 10*3/uL (ref 0.1–1.0)
Monocytes Relative: 19 %
Neutro Abs: 1.6 10*3/uL — ABNORMAL LOW (ref 1.7–7.7)
Neutrophils Relative %: 51 %
Platelet Count: 198 10*3/uL (ref 150–400)
RBC: 3.7 MIL/uL — ABNORMAL LOW (ref 3.87–5.11)
RDW: 13.8 % (ref 11.5–15.5)
WBC Count: 3.3 10*3/uL — ABNORMAL LOW (ref 4.0–10.5)
nRBC: 0 % (ref 0.0–0.2)

## 2022-05-07 LAB — CMP (CANCER CENTER ONLY)
ALT: 8 U/L (ref 0–44)
AST: 12 U/L — ABNORMAL LOW (ref 15–41)
Albumin: 3.9 g/dL (ref 3.5–5.0)
Alkaline Phosphatase: 62 U/L (ref 38–126)
Anion gap: 2 — ABNORMAL LOW (ref 5–15)
BUN: 18 mg/dL (ref 6–20)
CO2: 33 mmol/L — ABNORMAL HIGH (ref 22–32)
Calcium: 8.9 mg/dL (ref 8.9–10.3)
Chloride: 102 mmol/L (ref 98–111)
Creatinine: 1.05 mg/dL — ABNORMAL HIGH (ref 0.44–1.00)
GFR, Estimated: >60 mL/min (ref >60)
Glucose, Bld: 102 mg/dL — ABNORMAL HIGH (ref 70–99)
Potassium: 3.9 mmol/L (ref 3.5–5.1)
Sodium: 137 mmol/L (ref 135–145)
Total Bilirubin: 0.6 mg/dL (ref 0.3–1.2)
Total Protein: 7.4 g/dL (ref 6.5–8.1)

## 2022-05-07 MED ORDER — ACETAMINOPHEN 325 MG PO TABS
650.0000 mg | ORAL_TABLET | Freq: Once | ORAL | Status: AC
  Administered 2022-05-07: 650 mg via ORAL
  Filled 2022-05-07: qty 2

## 2022-05-07 MED ORDER — HEPARIN SOD (PORK) LOCK FLUSH 100 UNIT/ML IV SOLN
500.0000 [IU] | Freq: Once | INTRAVENOUS | Status: AC | PRN
  Administered 2022-05-07: 500 [IU]

## 2022-05-07 MED ORDER — DIPHENHYDRAMINE HCL 25 MG PO CAPS
50.0000 mg | ORAL_CAPSULE | Freq: Once | ORAL | Status: AC
  Administered 2022-05-07: 50 mg via ORAL
  Filled 2022-05-07: qty 2

## 2022-05-07 MED ORDER — SODIUM CHLORIDE 0.9% FLUSH
10.0000 mL | INTRAVENOUS | Status: DC | PRN
  Administered 2022-05-07: 10 mL

## 2022-05-07 MED ORDER — TRASTUZUMAB-DKST CHEMO 150 MG IV SOLR
6.0000 mg/kg | Freq: Once | INTRAVENOUS | Status: DC
  Filled 2022-05-07: qty 42

## 2022-05-07 MED ORDER — TRASTUZUMAB-DKST CHEMO 150 MG IV SOLR
6.0000 mg/kg | Freq: Once | INTRAVENOUS | Status: AC
  Administered 2022-05-07: 882 mg via INTRAVENOUS
  Filled 2022-05-07: qty 42

## 2022-05-07 MED ORDER — SODIUM CHLORIDE 0.9 % IV SOLN: Freq: Once | INTRAVENOUS | Status: AC

## 2022-05-07 NOTE — Progress Notes (Signed)
[facility]  [phone] [phone]   Name: Monica Mccoy Date: 05/07/2022 MRN: [id number]  DOB: 10/03/1965  Patient Care Team: Haydee Salter, MD as PCP - General (Family Medicine) Nicholas Lose, MD as Consulting Physician (Hematology and Oncology) Cameron Sprang, MD as Consulting Physician (Neurology)    REASON FOR CONSULTATION: Monica Mccoy is a 56 y.o. female with medical history including metastatic breast cancer (07/2012) with brain involvement s/p whole brain radiation (2015) and chemotherapy. Currently on maintenance Herceptin.  Palliative ask to see for symptom management and goals of care.    SOCIAL HISTORY:     reports that she has never smoked. She has never used smokeless tobacco. She reports current alcohol use. She reports current drug use. Drug: Marijuana.  ADVANCE DIRECTIVES:    CODE STATUS:   PAST MEDICAL HISTORY: Past Medical History:  Diagnosis Date   Anxiety    Breast cancer metastasized to brain [facility])    Left breast   Chronic pain after cancer treatment    Depression    GERD (gastroesophageal reflux disease)    History of cancer chemotherapy    Breast cancer left breast   Seizure ([place])     PAST SURGICAL HISTORY:  Past Surgical History:  Procedure Laterality Date   BRAIN SURGERY     brain tumor   MASTECTOMY Bilateral    ORIF ANKLE FRACTURE Right     HEMATOLOGY/ONCOLOGY HISTORY:  Oncology History  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[place])  06/13/2012 Initial Diagnosis   Right breast biopsy: Invasive ductal carcinoma, grade 3, HER-2 positive (3+), ER/PR negative, Ki67 5-10%, and in the right breast, high grade DCIS. BRCA gene testing was negative   08/06/2012 Surgery   Bilateral mastectomies:  Right breast: Invasive ductal carcinoma, 0.7cm, grade 2, with extensive DCIS, clear margins, 8/11 lymph nodes positive, measuring up to 3.8cm,  Left breast, no evidence of  malignancy and 6 lymph nodes negative for carcinoma.    2014 - 2015 Chemotherapy   adjuvant chemotherapy with 4 cycles of dose dense Adriamycin and Cytoxan followed by 4 cycles of Taxol Herceptin and then Herceptin maintenance. She underwent right chest radiation.       12/24/2013 Relapse/Recurrence   Dizziness, headaches, ataxia, and memory loss. Head CT showed a 1.8cm and a 2.0cm frontal lobe masses. She underwent resection of both masses on 01/07/14,: Moderately differentiated carcinoma, likely of breast origin, HER-2 positive, ER/PR negative, CK7 positive.   2015 - 2015 Radiation Therapy   Received whole brain radiation    Chemotherapy   Taxotere, Herceptin and Perjeta, but Taxotere was discontinued after 4 cycles       11/21/2020 -  Chemotherapy   Patient is on Treatment Plan : BREAST Trastuzumab q21d     02/09/2022 Cancer Staging   Staging form: Breast, AJCC 8th Edition - Pathologic: Stage IV (pM1) - Signed by Gardenia Phlegm, NP on 02/09/2022     ALLERGIES:  is allergic to dilaudid [hydromorphone hcl], morphine and related, penicillins, and tramadol.  MEDICATIONS:  Current Outpatient Medications  Medication Sig Dispense Refill   amLODipine (NORVASC) 5 MG tablet TAKE 1 TABLET (5 MG TOTAL) BY MOUTH DAILY. 90 tablet 3   diclofenac (VOLTAREN) 50 MG EC tablet Take 1 tablet (50 mg total) by mouth 2 (two) times daily with a meal. Make sure eats or takes something before using this medicine 30 tablet 1   divalproex (DEPAKOTE) 250 MG DR tablet Take 1  tablet (250 mg total) by mouth 2 (two) times daily. 60 tablet 1   furosemide (LASIX) 40 MG tablet Take 1 tablet (40 mg total) by mouth daily. 90 tablet 3   gabapentin (NEURONTIN) 300 MG capsule Take 1 capsule in AM, 1 capsule in PM, 2 capsules at bedtime 360 capsule 3   ketoconazole (NIZORAL) 2 % cream Apply to bottoms of feet twice daily 60 g 2   MELATONIN GUMMIES PO Take 10 mg by mouth.     mirabegron ER (MYRBETRIQ) 50 MG  TB24 tablet Take 1 tablet (50 mg total) by mouth daily. 90 tablet 1   pantoprazole (PROTONIX) 40 MG tablet TAKE 1 TABLET BY MOUTH EVERY DAY 90 tablet 3   polyethylene glycol (MIRALAX / GLYCOLAX) 17 g packet Take 17 g by mouth daily as needed for moderate constipation. 14 each 0   thiamine 100 MG tablet Take 1 tablet (100 mg total) by mouth daily. 30 tablet 11   traMADol (ULTRAM) 50 MG tablet Take 1 tablet (50 mg total) by mouth 2 (two) times daily. 60 tablet 0   venlafaxine XR (EFFEXOR XR) 37.5 MG 24 hr capsule Take 1 capsule (37.5 mg total) by mouth daily with breakfast. 30 capsule 5   No current facility-administered medications for this visit.   Facility-Administered Medications Ordered in Other Visits  Medication Dose Route Frequency Provider Last Rate Last Admin   sodium chloride flush (NS) 0.9 % injection 10 mL  10 mL Intracatheter PRN Nicholas Lose, MD   10 mL at 05/07/22 0940    VITAL SIGNS: There were no vitals taken for this visit. There were no vitals filed for this visit.  Estimated body mass index is 53.95 kg/m as calculated from the following:   Height as of 04/03/22: 5' 5.5" (1.664 m).   Weight as of 04/13/22: 329 lb 3.2 oz (149.3 kg).  LABS: CBC:    Component Value Date/Time   WBC 3.3 (L) 05/07/2022 0939   WBC 4.1 04/02/2022 1638   HGB 10.6 (L) 05/07/2022 0939   HCT 33.2 (L) 05/07/2022 0939   PLT 198 05/07/2022 0939   MCV 89.7 05/07/2022 0939   NEUTROABS 1.6 (L) 05/07/2022 0939   LYMPHSABS 0.9 05/07/2022 0939   MONOABS 0.6 05/07/2022 0939   EOSABS 0.1 05/07/2022 0939   BASOSABS 0.0 05/07/2022 0939   Comprehensive Metabolic Panel:    Component Value Date/Time   NA 138 04/03/2022 1454   NA 139 12/15/2019 0000   K 3.7 04/03/2022 1454   CL 102 04/03/2022 1454   CO2 32 04/03/2022 1454   BUN 13 04/03/2022 1454   BUN 10 12/15/2019 0000   CREATININE 0.87 04/03/2022 1454   GLUCOSE 89 04/03/2022 1454   CALCIUM 8.8 (L) 04/03/2022 1454   AST 11 (L) 04/03/2022 1454    ALT 6 04/03/2022 1454   ALKPHOS 70 04/03/2022 1454   BILITOT 0.8 04/03/2022 1454   PROT 7.7 04/03/2022 1454   ALBUMIN 4.0 04/03/2022 1454    RADIOGRAPHIC STUDIES: DG Ankle Complete Left  Result Date: 04/24/2022 Please see detailed radiograph report in office note.  DG Ankle Complete Right  Result Date: 04/24/2022 Please see detailed radiograph report in office note.   PERFORMANCE STATUS (ECOG) : 1 - Symptomatic but completely ambulatory  Review of Systems  Constitutional:  Positive for fatigue.  Cardiovascular:  Positive for leg swelling.  Musculoskeletal:  Positive for arthralgias.  Neurological:        Falls  Unless otherwise noted, a complete review  of systems is negative.  Physical Exam General: NAD, obese, in a wheelchair Cardiovascular: regular rate and rhythm Pulmonary: clear ant fields Abdomen: soft, nontender, + bowel sounds Extremities: Trace bilateral lower extremity edema, no joint deformities Skin: no rashes, dry Neurological: AAOx3, mood appropriate  IMPRESSION: This is initial visit with Ms. Forgione.  She presents to clinic alone today.  Made a wheelchair.  No acute distress.  Alert and able to engage in all discussions appropriately.  I introduced myself, [place] RN, and Palliative's role in collaboration with the oncology team. Concept of Palliative Care was introduced as specialized medical care for people and their families living with serious illness.  It focuses on providing relief from the symptoms and stress of a serious illness.  The goal is to improve quality of life for both the patient and the family. Values and goals of care important to patient and family were attempted to be elicited.   Ms. Kiernan originally from [place] and moved here with family.  She is divorced.  3 children (lives with her 2 sons).  Is a former adult Hotel manager.  At home patient is ambulatory with a walker.  Complains of frequent falls when trying to go up and  down stairs.  Shares her bedroom at home is upstairs however she has recently been sleeping downstairs on the couch due to safety concerns with steps.  Her daughter with bathing.  Appetite is good.  Bilateral feet and chest wall pain/Neuropathic pain Tymber complains of bilateral feet pain and some left sided chest wall discomfort. Describes pain in her feet as burning, sharp, with numbness. This has been ongoing which she feels contributes to her falls. We had extensive discussions regarding her safety and risk of bone fractures with frequent falls. She verbalized understanding.   A detailed review of her medication regimen was completed. She is currently taking tramadol 50-100 mg twice daily and gabapentin 300 mg in the morning and 600 mg at bedtime.   We discussed changes to regimen. She will begin taking tramadol three times daily and increase gabapentin to $RemoveBefor'600mg'bRjcHQtqXwdG$  twice daily. I expressed concerns regarding any additional sedating medications in the setting of high fall risk and recent falls. She verbalized understanding.   We will continue to closely monitor and make adjustments as needed.   Constipation  Eilis reports some ongoing constipation. Reports she may go 2-3 days with no results. Confirms she is taking Miralax daily. Encouraged to increase to twice daily. Also consider senna daily.    I discussed the importance of continued conversation with family and their medical providers regarding overall plan of care and treatment options, ensuring decisions are within the context of the patients values and GOCs.  PLAN: Established therapeutic relationship. Education provided on palliative's role in collaboration with their Oncology/Radiation team. Gabapentin 600 mg twice daily Tramadol 50-$RemoveBeforeDEI'100mg'KvGfxPnYDmDQFZvy$  every 8 hours as needed.  Miralax twice daily I will plan to see patient back in 2-4 weeks in collaboration to other oncology appointments.    Patient expressed understanding and was in  agreement with this plan. She also understands that She can call the clinic at any time with any questions, concerns, or complaints.   Thank you for your referral and allowing Palliative to assist in Ms. Marchell Chao's care.   Number and complexity of problems addressed: HIGH - 1 or more chronic illnesses with SEVERE exacerbation, progression, or side effects of treatment - advanced cancer, pain. Any controlled substances utilized were prescribed in the context of palliative  care.  Time Total: 50 min   Visit consisted of counseling and education dealing with the complex and emotionally intense issues of symptom management and palliative care in the setting of serious and potentially life-threatening illness.Greater than 50%  of this time was spent counseling and coordinating care related to the above assessment and plan.  Signed by: Alda Lea, AGPCNP-BC Palliative Medicine Team/[facility]

## 2022-05-07 NOTE — Patient Instructions (Signed)
[facility] MEDICAL ONCOLOGY  Discharge Instructions: Thank you for choosing [facility] to provide your oncology and hematology care.   If you have a lab appointment with the Cancer Center, please go directly to the Cancer Center and check in at the registration area.   Wear comfortable clothing and clothing appropriate for easy access to any Portacath or PICC line.   We strive to give you quality time with your provider. You may need to reschedule your appointment if you arrive late (15 or more minutes).  Arriving late affects you and other patients whose appointments are after yours.  Also, if you miss three or more appointments without notifying the office, you may be dismissed from the clinic at the provider's discretion.      For prescription refill requests, have your pharmacy contact our office and allow 72 hours for refills to be completed.    Today you received the following chemotherapy and/or immunotherapy agents herceptin      To help prevent nausea and vomiting after your treatment, we encourage you to take your nausea medication as directed.  BELOW ARE SYMPTOMS THAT SHOULD BE REPORTED IMMEDIATELY: *FEVER GREATER THAN 100.4 F (38 C) OR HIGHER *CHILLS OR SWEATING *NAUSEA AND VOMITING THAT IS NOT CONTROLLED WITH YOUR NAUSEA MEDICATION *UNUSUAL SHORTNESS OF BREATH *UNUSUAL BRUISING OR BLEEDING *URINARY PROBLEMS (pain or burning when urinating, or frequent urination) *BOWEL PROBLEMS (unusual diarrhea, constipation, pain near the anus) TENDERNESS IN MOUTH AND THROAT WITH OR WITHOUT PRESENCE OF ULCERS (sore throat, sores in mouth, or a toothache) UNUSUAL RASH, SWELLING OR PAIN  UNUSUAL VAGINAL DISCHARGE OR ITCHING   Items with * indicate a potential emergency and should be followed up as soon as possible or go to the Emergency Department if any problems should occur.  Please show the CHEMOTHERAPY ALERT CARD or IMMUNOTHERAPY ALERT CARD at check-in to  the Emergency Department and triage nurse.  Should you have questions after your visit or need to cancel or reschedule your appointment, please contact [facility]ICAL [facility]COLOG[facility]  Dept: [phone]  and follow the prompts.  Office hours are 8:00 a.m. to 4:30 p.m. Monday - Friday. Please note that voicemails left after 4:00 p.m. may not be returned until the following business day.  We are closed weekends and major holidays. You have access to a nurse at all times for urgent questions. Please call the main number to the clinic Dept: [phone] and follow the prompts.   For any non-urgent questions, you may also contact your provider using MyChart. We now offer e-Visits for anyone 18 and older to request care online for non-urgent symptoms. For details visit [email or web address].   Also download the MyChart app! Go to the app store, search "MyChart", open the app, select [facility], and log in with your MyChart username and password.  Masks are optional in the cancer centers. If you would like for your care team to wear a mask while they are taking care of you, please let them know. You may have one support person who is at least 55 years old accompany you for your appointments. 

## 2022-05-07 NOTE — Patient Instructions (Signed)
-   take miralax 2 times a day for constipation - take gabapentin 2 pills in the morning and 2 pills in the evening - take tramadol 3 times a day as needed for pain

## 2022-05-08 ENCOUNTER — Telehealth: Payer: Self-pay | Admitting: Hematology and Oncology

## 2022-05-08 NOTE — Telephone Encounter (Signed)
Scheduled appointment per WQ. Talked to the patients sister [name] and she is aware of the upcoming appointments.

## 2022-05-09 ENCOUNTER — Ambulatory Visit: Payer: Medicare Other

## 2022-05-10 ENCOUNTER — Telehealth: Payer: Self-pay | Admitting: Family Medicine

## 2022-05-10 NOTE — Telephone Encounter (Signed)
[place] Name: Jerl Mina w/[facility] Phone #: [phone]  Service Requested: [facility] PT  Frequency of Visits: [place] PT 1 week 4 Reporting PHQ9 depression screened = moderate Pt reported fall approximately Tuesday of this week. Pt feel forward but no reported injury. Pt notes back pain that is ongoing prior to fall.

## 2022-05-11 NOTE — Telephone Encounter (Signed)
Spoke to [place] w/[facility]  at [phone], gave verbal for PT orders 1 week 4.   Seh also reports that patient is sleeping all the time and seems more depressed even with taking the Venlafaxine.   Will address this at her upcoming appt on 05/21/22.  Dm/cma

## 2022-05-14 ENCOUNTER — Inpatient Hospital Stay: Payer: Medicare Other | Admitting: Nurse Practitioner

## 2022-05-14 ENCOUNTER — Encounter
Payer: Medicare Other | Attending: Physical Medicine and Rehabilitation | Admitting: Physical Medicine and Rehabilitation

## 2022-05-15 ENCOUNTER — Ambulatory Visit: Payer: Medicare Other

## 2022-05-19 ENCOUNTER — Other Ambulatory Visit: Payer: Self-pay | Admitting: Internal Medicine

## 2022-05-19 DIAGNOSIS — Z8669 Personal history of other diseases of the nervous system and sense organs: Secondary | ICD-10-CM

## 2022-05-21 ENCOUNTER — Ambulatory Visit (INDEPENDENT_AMBULATORY_CARE_PROVIDER_SITE_OTHER): Payer: Medicare Other | Admitting: Family Medicine

## 2022-05-21 ENCOUNTER — Encounter: Payer: Self-pay | Admitting: Family Medicine

## 2022-05-21 VITALS — BP 142/86 | HR 65 | Temp 97.1°F | Wt 335.4 lb

## 2022-05-21 DIAGNOSIS — C7931 Secondary malignant neoplasm of brain: Secondary | ICD-10-CM

## 2022-05-21 DIAGNOSIS — I10 Essential (primary) hypertension: Secondary | ICD-10-CM

## 2022-05-21 DIAGNOSIS — Z8669 Personal history of other diseases of the nervous system and sense organs: Secondary | ICD-10-CM

## 2022-05-21 DIAGNOSIS — R32 Unspecified urinary incontinence: Secondary | ICD-10-CM

## 2022-05-21 DIAGNOSIS — F331 Major depressive disorder, recurrent, moderate: Secondary | ICD-10-CM | POA: Diagnosis not present

## 2022-05-21 DIAGNOSIS — K219 Gastro-esophageal reflux disease without esophagitis: Secondary | ICD-10-CM

## 2022-05-21 DIAGNOSIS — R600 Localized edema: Secondary | ICD-10-CM

## 2022-05-21 DIAGNOSIS — Z1211 Encounter for screening for malignant neoplasm of colon: Secondary | ICD-10-CM

## 2022-05-21 DIAGNOSIS — C50911 Malignant neoplasm of unspecified site of right female breast: Secondary | ICD-10-CM

## 2022-05-21 DIAGNOSIS — G8929 Other chronic pain: Secondary | ICD-10-CM

## 2022-05-21 DIAGNOSIS — R300 Dysuria: Secondary | ICD-10-CM

## 2022-05-21 MED ORDER — PANTOPRAZOLE SODIUM 40 MG PO TBEC: 40.0000 mg | DELAYED_RELEASE_TABLET | Freq: Every day | ORAL | 3 refills | Status: DC

## 2022-05-21 MED ORDER — DIVALPROEX SODIUM 250 MG PO DR TAB: 250.0000 mg | DELAYED_RELEASE_TABLET | Freq: Two times a day (BID) | ORAL | 1 refills | Status: DC

## 2022-05-21 MED ORDER — TRAMADOL HCL 50 MG PO TABS: 50.0000 mg | ORAL_TABLET | Freq: Two times a day (BID) | ORAL | 0 refills | Status: DC

## 2022-05-21 MED ORDER — MIRABEGRON ER 50 MG PO TB24: 50.0000 mg | ORAL_TABLET | Freq: Every day | ORAL | 1 refills | Status: DC

## 2022-05-21 MED ORDER — FUROSEMIDE 40 MG PO TABS: 40.0000 mg | ORAL_TABLET | Freq: Every day | ORAL | 3 refills | Status: DC

## 2022-05-21 MED ORDER — LISINOPRIL 10 MG PO TABS: 10.0000 mg | ORAL_TABLET | Freq: Every day | ORAL | 3 refills | Status: DC

## 2022-05-21 MED ORDER — VENLAFAXINE HCL ER 75 MG PO CP24: 75.0000 mg | ORAL_CAPSULE | Freq: Every day | ORAL | 5 refills | Status: DC

## 2022-05-21 MED ORDER — GABAPENTIN 300 MG PO CAPS: 600.0000 mg | ORAL_CAPSULE | Freq: Two times a day (BID) | ORAL | 3 refills | Status: DC

## 2022-05-21 NOTE — Progress Notes (Signed)
[facility] [address] [place] [address] Dept: [phone] Dept Fax: [phone]  Chronic Care Office Visit  Subjective:    Patient ID: Monica Mccoy, female    DOB: February 16, 1966, 56 y.o..   MRN: [id number]  Chief Complaint  Patient presents with   Follow-up    3 month F/U Swelling in feet, about a week.Pain rate 10. Tramadol for pain.Depression and Incontinent has increase.     History of Present Illness:  Patient is in today for reassessment of chronic medical issues.  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 BRCA gene testing was negative. This was diagnosed in Oct. 2013. She underwent bilateral mastectomies in Dec. 2013. This was followed with chemotherapy and radiation. In April 2015, she was found to have two frontal lobe tumors. She underwent resection, again followed with radiation and chemotherapy. She is currently on Herceptin maintenance therapy. She was recently established with palliative care. She does have chronic left chest wall pain associated. She is managed with gabapentin and tramadol. Monica Mccoy recently increased her gabapentin to 600 mg bid and her tramadol to up to three a day. Monica Mccoy sister, Monica Mccoy, has some concerns about the combination being too much for Monica Mccoy to tolerate, esp. as she tends to be unsteady at present and has had some falls. We have engaged with PT to try and help with this.  Monica Mccoy has a history of hypertension. She is managed on amlodipine 5 mg daily.    Monica Mccoy has a history of depression. At her last visit, we switched her antidepressant to venlafaxine 37.5 mg daily. She is still struggling with this.   Monica Mccoy has a history of pedal edema. This has been an ongoing issue. She is on Lasix 40 mg daily. Her prior echocardiogram does not  show any underlying heart failure.  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   Localization-related (focal) (partial) symptomatic epilepsy and epileptic syndromes with complex partial seizures, not intractable, without status epilepticus ([place]) 09/11/2021   Essential hypertension 05/30/2021   Acute encephalopathy 05/19/2021   Port-A-Cath in place 01/16/2021   Migraine without aura 12/19/2020   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[place]) 09/22/2020   Gastroesophageal reflux disease without esophagitis 06/29/2020   Other chronic pain 06/29/2020   Moderate episode of recurrent major depressive disorder ([place]) 06/29/2020   Urinary incontinence 06/29/2020   Past Surgical History:  Procedure Laterality Date   BRAIN SURGERY     brain tumor   MASTECTOMY Bilateral    ORIF ANKLE FRACTURE Right    Family History  Problem Relation Age of Onset   Obesity Mother    Hypertension Mother    Hyperlipidemia Mother    Diabetes Mother    Heart disease Mother    Outpatient Medications Prior to Visit  Medication Sig Dispense Refill   diclofenac (VOLTAREN) 50 MG EC tablet Take 1 tablet (50 mg total) by mouth 2 (two) times daily with a meal. Make sure eats or takes something before using this medicine 30 tablet 1   ketoconazole (NIZORAL) 2 % cream Apply to  bottoms of feet twice daily 60 g 2   MELATONIN GUMMIES PO Take 10 mg by mouth.     polyethylene glycol (MIRALAX / GLYCOLAX) 17 g packet Take 17 g by mouth daily as needed for moderate constipation. 14 each 0   thiamine 100 MG tablet Take 1 tablet (100 mg total) by mouth daily. 30 tablet 11   amLODipine (NORVASC) 5 MG tablet TAKE 1 TABLET (5 MG TOTAL) BY MOUTH  DAILY. 90 tablet 3   divalproex (DEPAKOTE) 250 MG DR tablet Take 1 tablet (250 mg total) by mouth 2 (two) times daily. 60 tablet 1   furosemide (LASIX) 40 MG tablet Take 1 tablet (40 mg total) by mouth daily. 90 tablet 3   gabapentin (NEURONTIN) 300 MG capsule Take 1 capsule in AM, 1 capsule in PM, 2 capsules at bedtime 360 capsule 3   mirabegron ER (MYRBETRIQ) 50 MG TB24 tablet Take 1 tablet (50 mg total) by mouth daily. 90 tablet 1   pantoprazole (PROTONIX) 40 MG tablet TAKE 1 TABLET BY MOUTH EVERY DAY 90 tablet 3   traMADol (ULTRAM) 50 MG tablet Take 1 tablet (50 mg total) by mouth 2 (two) times daily. 60 tablet 0   venlafaxine XR (EFFEXOR XR) 37.5 MG 24 hr capsule Take 1 capsule (37.5 mg total) by mouth daily with breakfast. 30 capsule 5   No facility-administered medications prior to visit.   Allergies  Allergen Reactions   Dilaudid [Hydromorphone Hcl] Other (See Comments)    Confusion & hallucinations   Morphine And Related Other (See Comments)    HA   Penicillins    Tramadol       Objective:   Today's Vitals   05/21/22 1111 05/21/22 1120  BP:  (!) 142/86  Pulse: 65   Temp: (!) 97.1 F (36.2 C)   TempSrc: Temporal   SpO2: 96%   Weight: (!) 335 lb 6.4 oz (152.1 kg)    Body mass index is 54.96 kg/m.   General: Well developed, well nourished. No acute distress. Extremities: 3+ edema noted. Psych: Alert and oriented. Normal mood and affect.  Health Maintenance Due  Topic Date Due   Hepatitis C Screening  Never done   TETANUS/TDAP  Never done   Zoster Vaccines- Shingrix (1 of 2) Never done   PAP SMEAR-Modifier  Never done   COLONOSCOPY (Pts 45-1yr Insurance coverage will need to be confirmed)  Never done   COVID-19 Vaccine (3 - Moderna risk series) 02/16/2020        05/21/2022   11:44 AM 04/13/2022   11:23 AM 03/27/2022    1:32 PM  Depression screen PHQ 2/9  Decreased Interest 3 3 0  Down, Depressed, Hopeless 2 2 0  PHQ - 2 Score 5 5 0  Altered sleeping 3     Tired, decreased energy 3    Change in appetite 3    Feeling bad or failure about yourself  2    Trouble concentrating 3    Moving slowly or fidgety/restless 2    Suicidal thoughts 1    PHQ-9 Score 22    Difficult doing work/chores Somewhat difficult        05/21/2022   11:44 AM 10/16/2021    1:26 PM 06/29/2020   12:26 PM  GAD 7 : Generalized Anxiety Score  Nervous, Anxious, on Edge _0 Control/stop worrying _1 Worry too much - different things _2 Trouble relaxing _3 Restless  _0 Easily annoyed or irritable _1 Afraid - awful might happen _2 Total GAD 7 Score _3 Anxiety Difficulty Somewhat difficult Somewhat difficult Somewhat difficult   Assessment & Plan:   1. Essential hypertension Monica Mccoy's blood pressure is not at goal today. In light of the ongoing issue with pedal edema, I recommend we switch her to a different antihypertensive. I will start her on lisinopril.  - lisinopril (ZESTRIL) 10 MG tablet; Take 1 tablet (10 mg total) by mouth daily.  Dispense: 90 tablet; Refill: 3  2. 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[place]) 3. Other chronic pain Continue Herpceptin under care of oncology. The recommended changes to her chronic pain meds made by palliative care seem appropriate. We did discuss the difficult balance between effectively managing her pain and not contributing to falls. She wants to remain cautious of this.  - traMADol (ULTRAM) 50 MG tablet; Take 1 tablet (50 mg total) by mouth 2 (two) times daily.  Dispense: 60 tablet; Refill: 0 - gabapentin (NEURONTIN) 300 MG capsule; Take 2 capsules (600 mg total) by mouth 2 (two) times daily.  Dispense: 360 capsule; Refill: 3  4. Moderate episode of recurrent major depressive disorder ([place]) I will have her step up the dose of the venlafaxine. I will check her back in 6 weeks.  - venlafaxine XR (EFFEXOR XR) 75 MG 24 hr capsule; Take 1 capsule (75 mg total) by mouth daily with breakfast.   Dispense: 30 capsule; Refill: 5  5. Pedal edema Plan to continue Lasix. I will stop her amlodipine as mentioned above.  - furosemide (LASIX) 40 MG tablet; Take 1 tablet (40 mg total) by mouth daily.  Dispense: 90 tablet; Refill: 3  6. Hx of seizure disorder Stable on Depakote.  - divalproex (DEPAKOTE) 250 MG DR tablet; Take 1 tablet (250 mg total) by mouth 2 (two) times daily.  Dispense: 60 tablet; Refill: 1  7. Urinary incontinence, unspecified type Stable on mirabegron.  - mirabegron ER (MYRBETRIQ) 50 MG TB24 tablet; Take 1 tablet (50 mg total) by mouth daily.  Dispense: 90 tablet; Refill: 1  8. Gastroesophageal reflux disease without esophagitis Continue daily PPI.  - pantoprazole (PROTONIX) 40 MG tablet; Take 1 tablet (40 mg total) by mouth daily.  Dispense: 90 tablet; Refill: 3  9. 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  - Ambulatory referral to Gynecology  10. Screening for colon cancer  - Ambulatory referral to Gastroenterology  Return in about 6 weeks (around 07/02/2022) for Reassessment.   Haydee Salter, MD

## 2022-05-23 ENCOUNTER — Encounter: Payer: Self-pay | Admitting: Nurse Practitioner

## 2022-05-23 ENCOUNTER — Inpatient Hospital Stay (HOSPITAL_BASED_OUTPATIENT_CLINIC_OR_DEPARTMENT_OTHER): Payer: Medicare Other | Admitting: Nurse Practitioner

## 2022-05-23 DIAGNOSIS — R530 Neoplastic (malignant) related fatigue: Secondary | ICD-10-CM

## 2022-05-23 DIAGNOSIS — C7931 Secondary malignant neoplasm of brain: Secondary | ICD-10-CM

## 2022-05-23 DIAGNOSIS — Z5112 Encounter for antineoplastic immunotherapy: Secondary | ICD-10-CM | POA: Diagnosis not present

## 2022-05-23 DIAGNOSIS — G893 Neoplasm related pain (acute) (chronic): Secondary | ICD-10-CM

## 2022-05-23 DIAGNOSIS — C50911 Malignant neoplasm of unspecified site of right female breast: Secondary | ICD-10-CM | POA: Diagnosis not present

## 2022-05-23 DIAGNOSIS — Z515 Encounter for palliative care: Secondary | ICD-10-CM

## 2022-05-23 DIAGNOSIS — K5903 Drug induced constipation: Secondary | ICD-10-CM

## 2022-05-23 NOTE — Progress Notes (Unsigned)
[facility]  [phone] [phone]) [phone]   Name: Nyajah Hyson Date: 05/23/2022 MRN: [id number]  DOB: 1966/08/04  Patient Care Team: Haydee Salter, MD as PCP - General (Family Medicine) Nicholas Lose, MD as Consulting Physician (Hematology and Oncology) Cameron Sprang, MD as Consulting Physician (Neurology) Pickenpack-Cousar, Carlena Sax, NP as Nurse Practitioner (Nurse Practitioner)   I connected with [name] on 05/23/22 at 11:00 AM EDT by phone and verified that I am speaking with the correct person using two identifiers.   I discussed the limitations, risks, security and privacy concerns of performing an evaluation and management service by telemedicine and the availability of in-person appointments. I also discussed with the patient that there may be a patient responsible charge related to this service. The patient expressed understanding and agreed to proceed.   Other persons participating in the visit and their role in the encounter: [place], RN    Patient's location: Home   Provider's location: [place]: Kolette Vey is a 56 y.o. female with medical history including metastatic breast cancer (07/2012) with brain involvement s/p whole brain radiation (2015) and chemotherapy. Currently on maintenance Herceptin.  Palliative ask to see for symptom management and goals of care.   SOCIAL HISTORY:     reports that she has never smoked. She has never used smokeless tobacco. She reports current alcohol use. She reports current drug use. Drug: Marijuana.  ADVANCE DIRECTIVES:    CODE STATUS:   PAST MEDICAL HISTORY: Past Medical History:  Diagnosis Date   Anxiety    Breast cancer metastasized to brain [facility])    Left breast   Chronic pain after cancer treatment    Depression    GERD (gastroesophageal reflux disease)    History of cancer chemotherapy    Breast cancer left breast   Seizure  ([facility])     ALLERGIES:  is allergic to dilaudid [hydromorphone hcl], morphine and related, penicillins, and tramadol.  MEDICATIONS:  Current Outpatient Medications  Medication Sig Dispense Refill   diclofenac (VOLTAREN) 50 MG EC tablet Take 1 tablet (50 mg total) by mouth 2 (two) times daily with a meal. Make sure eats or takes something before using this medicine 30 tablet 1   divalproex (DEPAKOTE) 250 MG DR tablet Take 1 tablet (250 mg total) by mouth 2 (two) times daily. 60 tablet 1   furosemide (LASIX) 40 MG tablet Take 1 tablet (40 mg total) by mouth daily. 90 tablet 3   gabapentin (NEURONTIN) 300 MG capsule Take 2 capsules (600 mg total) by mouth 2 (two) times daily. 360 capsule 3   ketoconazole (NIZORAL) 2 % cream Apply to bottoms of feet twice daily 60 g 2   lisinopril (ZESTRIL) 10 MG tablet Take 1 tablet (10 mg total) by mouth daily. 90 tablet 3   MELATONIN GUMMIES PO Take 10 mg by mouth.     mirabegron ER (MYRBETRIQ) 50 MG TB24 tablet Take 1 tablet (50 mg total) by mouth daily. 90 tablet 1   pantoprazole (PROTONIX) 40 MG tablet Take 1 tablet (40 mg total) by mouth daily. 90 tablet 3   polyethylene glycol (MIRALAX / GLYCOLAX) 17 g packet Take 17 g by mouth daily as needed for moderate constipation. 14 each 0   thiamine 100 MG tablet Take 1 tablet (100 mg total) by mouth daily. 30 tablet 11   traMADol (ULTRAM) 50 MG tablet Take 1 tablet (50 mg total) by mouth  2 (two) times daily. 60 tablet 0   venlafaxine XR (EFFEXOR XR) 75 MG 24 hr capsule Take 1 capsule (75 mg total) by mouth daily with breakfast. 30 capsule 5   No current facility-administered medications for this visit.    VITAL SIGNS: There were no vitals taken for this visit. There were no vitals filed for this visit.  Estimated body mass index is 54.96 kg/m as calculated from the following:   Height as of 04/03/22: 5' 5.5" (1.664 m).   Weight as of 05/21/22: 335 lb 6.4 oz (152.1 kg).   PERFORMANCE STATUS (ECOG) : 1 -  Symptomatic but completely ambulatory  IMPRESSION: I connected with Ms. Eastep by phone. No acute distress identified. States today is not one of her best days. She is having some pain. Her sister recently administered her Tramadol. Complaining of back pain and chest wall pain.   Reports appetite remains good. She is sleeping well at night.   Bilateral feet and chest wall pain/Neuropathic pain Sabrin is having some discomfort this morning. We discussed her pain regimen. She is tolerating well. Taking as prescribed. She is taking tramadol 50-'100mg'$  every 8 hours as needed and gabapentin '600mg'$  twice daily. Endorses improvement in neuropathic pain although it is still there.   We will continue to closely monitor and make adjustments as needed.   Constipation  Continues to improve.   I discussed the importance of continued conversation with family and their medical providers regarding overall plan of care and treatment options, ensuring decisions are within the context of the patients values and GOCs.  PLAN: Tramadol 50-100 mg every 8hrs as needed Gabapentin 600 mg twice daily Miralax twice daily I will plan to see patient in 1-2 weeks   Patient expressed understanding and was in agreement with this plan. She also understands that She can call the clinic at any time with any questions, concerns, or complaints.   Any controlled substances utilized were prescribed in the context of palliative care. PDMP has been reviewed.   Time Total: 20 min  Visit consisted of counseling and education dealing with the complex and emotionally intense issues of symptom management and palliative care in the setting of serious and potentially life-threatening illness.Greater than 50%  of this time was spent counseling and coordinating care related to the above assessment and plan.  Alda Lea, AGPCNP-BC  Palliative Medicine Team/[facility]

## 2022-05-24 ENCOUNTER — Encounter: Payer: Self-pay | Admitting: Hematology and Oncology

## 2022-05-28 ENCOUNTER — Inpatient Hospital Stay: Payer: Medicare Other | Attending: Internal Medicine | Admitting: Adult Health

## 2022-05-28 ENCOUNTER — Ambulatory Visit: Payer: Medicare Other | Admitting: Family Medicine

## 2022-05-29 ENCOUNTER — Inpatient Hospital Stay: Payer: Medicare Other | Admitting: Nurse Practitioner

## 2022-05-29 ENCOUNTER — Inpatient Hospital Stay: Payer: Medicare Other

## 2022-05-29 ENCOUNTER — Ambulatory Visit: Payer: Medicare Other

## 2022-05-29 ENCOUNTER — Other Ambulatory Visit: Payer: Medicare Other

## 2022-05-29 ENCOUNTER — Ambulatory Visit: Payer: Medicare Other | Admitting: Hematology and Oncology

## 2022-05-30 ENCOUNTER — Telehealth: Payer: Self-pay | Admitting: Family Medicine

## 2022-05-30 NOTE — Telephone Encounter (Signed)
[place] Name: [facility] Agency Name: [facility] Johns Phone #: [phone]  Reporting 2 falls, 1 Monday & 1 Tuesday, family reports increase in confusion, pt reports burning with urination

## 2022-05-31 NOTE — Telephone Encounter (Signed)
Spoke to [place] with [facility], patient will need an appointment. Will call patients sister [name] to set this up Dm/cma

## 2022-05-31 NOTE — Telephone Encounter (Signed)
Patient has an appointment 06/01/22. Dm/cma

## 2022-06-07 ENCOUNTER — Ambulatory Visit (INDEPENDENT_AMBULATORY_CARE_PROVIDER_SITE_OTHER): Payer: Medicare Other | Admitting: Podiatry

## 2022-06-07 DIAGNOSIS — G620 Drug-induced polyneuropathy: Secondary | ICD-10-CM | POA: Diagnosis not present

## 2022-06-07 DIAGNOSIS — T451X5A Adverse effect of antineoplastic and immunosuppressive drugs, initial encounter: Secondary | ICD-10-CM

## 2022-06-07 DIAGNOSIS — M25473 Effusion, unspecified ankle: Secondary | ICD-10-CM | POA: Diagnosis not present

## 2022-06-07 DIAGNOSIS — M7750 Other enthesopathy of unspecified foot: Secondary | ICD-10-CM | POA: Diagnosis not present

## 2022-06-07 NOTE — Progress Notes (Signed)
Chief Complaint  Patient presents with   Ankle Pain    Rm 17 Follow up bilateral ankle pain. Pt states she is doing okay but states she has memory problems so she really doesn't know.      HPI: Patient is 56 y.o. female who presents today for follow up of bilateral foot/ankle pain.  She had injection in each foot at last visit and thinks that the pain has subsequently decreased. Has been going to PT which she thinks is also helping.    Allergies  Allergen Reactions   Dilaudid [Hydromorphone Hcl] Other (See Comments)    Confusion & hallucinations   Morphine And Related Other (See Comments)    HA   Penicillins    Tramadol     Review of systems is negative except as noted in the HPI.  Denies nausea/ vomiting/ fevers/ chills or night sweats.   Denies difficulty breathing, denies calf pain or tenderness  Physical Exam  Patient is awake, alert, and oriented x 3.  In no acute distress.    Vascular status is intact with palpable pedal pulses DP and PT bilateral and capillary refill time less than 3 seconds bilateral.  edema to bilateral feet is noted with right being more severe than left.  Neurological exam reveals epicritic and protective sensation grossly intact bilateral. Subjective symptoms of neuropathy present.   Dermatological no open lesions, no ulcerations are present.  Xerotic skin is noted.  A symptomatic, dystrophic toenails 1 through 5 bilateral noted  Musculoskeletal exam: Decreased pain with palpation medial ankle and plantar medial tubercle bilaterally.      Assessment:   ICD-10-CM   1. Ankle swelling, unspecified laterality  M25.473     2. Tendonitis of ankle or foot  M77.50     3. Chemotherapy-induced neuropathy ([place])  G62.0    T45.1X5A        Plan: Discussed with patient, appears improved after steroid injection bilateral foot. As she is much improved at this time, recommend continued PT. Return as needed for further steroid injections. Patient is in  agreement with plan follow up PRN.

## 2022-06-08 ENCOUNTER — Inpatient Hospital Stay (HOSPITAL_COMMUNITY)
Admission: EM | Admit: 2022-06-08 | Discharge: 2022-06-18 | DRG: 177 | Disposition: A | Discharge status: Skilled Nursing Facility | Payer: Medicare Other | Attending: Internal Medicine | Admitting: Internal Medicine

## 2022-06-08 ENCOUNTER — Other Ambulatory Visit: Payer: Self-pay

## 2022-06-08 ENCOUNTER — Emergency Department (HOSPITAL_COMMUNITY): Payer: Medicare Other

## 2022-06-08 ENCOUNTER — Encounter (HOSPITAL_COMMUNITY): Payer: Self-pay

## 2022-06-08 DIAGNOSIS — F32A Depression, unspecified: Secondary | ICD-10-CM | POA: Diagnosis present

## 2022-06-08 DIAGNOSIS — R32 Unspecified urinary incontinence: Secondary | ICD-10-CM | POA: Diagnosis present

## 2022-06-08 DIAGNOSIS — K5900 Constipation, unspecified: Secondary | ICD-10-CM | POA: Diagnosis present

## 2022-06-08 DIAGNOSIS — T451X5A Adverse effect of antineoplastic and immunosuppressive drugs, initial encounter: Secondary | ICD-10-CM | POA: Diagnosis present

## 2022-06-08 DIAGNOSIS — I10 Essential (primary) hypertension: Secondary | ICD-10-CM | POA: Diagnosis present

## 2022-06-08 DIAGNOSIS — Z79891 Long term (current) use of opiate analgesic: Secondary | ICD-10-CM

## 2022-06-08 DIAGNOSIS — G8929 Other chronic pain: Secondary | ICD-10-CM | POA: Diagnosis present

## 2022-06-08 DIAGNOSIS — C50911 Malignant neoplasm of unspecified site of right female breast: Secondary | ICD-10-CM | POA: Diagnosis present

## 2022-06-08 DIAGNOSIS — Z880 Allergy status to penicillin: Secondary | ICD-10-CM

## 2022-06-08 DIAGNOSIS — U071 COVID-19: Principal | ICD-10-CM | POA: Diagnosis present

## 2022-06-08 DIAGNOSIS — Z1501 Genetic susceptibility to malignant neoplasm of breast: Secondary | ICD-10-CM

## 2022-06-08 DIAGNOSIS — Z7189 Other specified counseling: Secondary | ICD-10-CM

## 2022-06-08 DIAGNOSIS — M549 Dorsalgia, unspecified: Secondary | ICD-10-CM | POA: Diagnosis present

## 2022-06-08 DIAGNOSIS — Z9013 Acquired absence of bilateral breasts and nipples: Secondary | ICD-10-CM

## 2022-06-08 DIAGNOSIS — Z8249 Family history of ischemic heart disease and other diseases of the circulatory system: Secondary | ICD-10-CM

## 2022-06-08 DIAGNOSIS — C7931 Secondary malignant neoplasm of brain: Secondary | ICD-10-CM | POA: Diagnosis present

## 2022-06-08 DIAGNOSIS — G9341 Metabolic encephalopathy: Secondary | ICD-10-CM | POA: Diagnosis present

## 2022-06-08 DIAGNOSIS — G620 Drug-induced polyneuropathy: Secondary | ICD-10-CM | POA: Diagnosis present

## 2022-06-08 DIAGNOSIS — E66813 Obesity, class 3: Secondary | ICD-10-CM | POA: Insufficient documentation

## 2022-06-08 DIAGNOSIS — Z923 Personal history of irradiation: Secondary | ICD-10-CM

## 2022-06-08 DIAGNOSIS — Z885 Allergy status to narcotic agent status: Secondary | ICD-10-CM

## 2022-06-08 DIAGNOSIS — Z87440 Personal history of urinary (tract) infections: Secondary | ICD-10-CM

## 2022-06-08 DIAGNOSIS — Z6841 Body Mass Index (BMI) 40.0 and over, adult: Secondary | ICD-10-CM

## 2022-06-08 DIAGNOSIS — W1830XA Fall on same level, unspecified, initial encounter: Secondary | ICD-10-CM | POA: Diagnosis present

## 2022-06-08 DIAGNOSIS — R4182 Altered mental status, unspecified: Secondary | ICD-10-CM | POA: Diagnosis not present

## 2022-06-08 DIAGNOSIS — Z515 Encounter for palliative care: Secondary | ICD-10-CM

## 2022-06-08 DIAGNOSIS — K219 Gastro-esophageal reflux disease without esophagitis: Secondary | ICD-10-CM | POA: Diagnosis present

## 2022-06-08 DIAGNOSIS — C50919 Malignant neoplasm of unspecified site of unspecified female breast: Secondary | ICD-10-CM | POA: Diagnosis present

## 2022-06-08 DIAGNOSIS — Z853 Personal history of malignant neoplasm of breast: Secondary | ICD-10-CM

## 2022-06-08 DIAGNOSIS — F419 Anxiety disorder, unspecified: Secondary | ICD-10-CM | POA: Diagnosis present

## 2022-06-08 DIAGNOSIS — R531 Weakness: Secondary | ICD-10-CM

## 2022-06-08 DIAGNOSIS — Z79899 Other long term (current) drug therapy: Secondary | ICD-10-CM

## 2022-06-08 DIAGNOSIS — G40209 Localization-related (focal) (partial) symptomatic epilepsy and epileptic syndromes with complex partial seizures, not intractable, without status epilepticus: Secondary | ICD-10-CM | POA: Diagnosis present

## 2022-06-08 DIAGNOSIS — E876 Hypokalemia: Secondary | ICD-10-CM | POA: Diagnosis present

## 2022-06-08 LAB — CBC WITH DIFFERENTIAL/PLATELET
Abs Immature Granulocytes: 0.01 10*3/uL (ref 0.00–0.07)
Basophils Absolute: 0 10*3/uL (ref 0.0–0.1)
Basophils Relative: 1 %
Eosinophils Absolute: 0.1 10*3/uL (ref 0.0–0.5)
Eosinophils Relative: 2 %
HCT: 36 % (ref 36.0–46.0)
Hemoglobin: 11.2 g/dL — ABNORMAL LOW (ref 12.0–15.0)
Immature Granulocytes: 0 %
Lymphocytes Relative: 8 %
Lymphs Abs: 0.4 10*3/uL — ABNORMAL LOW (ref 0.7–4.0)
MCH: 28.4 pg (ref 26.0–34.0)
MCHC: 31.1 g/dL (ref 30.0–36.0)
MCV: 91.4 fL (ref 80.0–100.0)
Monocytes Absolute: 0.7 10*3/uL (ref 0.1–1.0)
Monocytes Relative: 16 %
Neutro Abs: 3.2 10*3/uL (ref 1.7–7.7)
Neutrophils Relative %: 73 %
Platelets: 213 10*3/uL (ref 150–400)
RBC: 3.94 MIL/uL (ref 3.87–5.11)
RDW: 14 % (ref 11.5–15.5)
WBC: 4.4 10*3/uL (ref 4.0–10.5)
nRBC: 0 % (ref 0.0–0.2)

## 2022-06-08 LAB — URINALYSIS, ROUTINE W REFLEX MICROSCOPIC
Bilirubin Urine: NEGATIVE
Glucose, UA: NEGATIVE mg/dL
Hgb urine dipstick: NEGATIVE
Ketones, ur: NEGATIVE mg/dL
Leukocytes,Ua: NEGATIVE
Nitrite: NEGATIVE
Protein, ur: NEGATIVE mg/dL
Specific Gravity, Urine: 1.01 (ref 1.005–1.030)
pH: 8 (ref 5.0–8.0)

## 2022-06-08 LAB — COMPREHENSIVE METABOLIC PANEL
ALT: 11 U/L (ref 0–44)
AST: 14 U/L — ABNORMAL LOW (ref 15–41)
Albumin: 3.7 g/dL (ref 3.5–5.0)
Alkaline Phosphatase: 69 U/L (ref 38–126)
Anion gap: 5 (ref 5–15)
BUN: 13 mg/dL (ref 6–20)
CO2: 29 mmol/L (ref 22–32)
Calcium: 8.6 mg/dL — ABNORMAL LOW (ref 8.9–10.3)
Chloride: 103 mmol/L (ref 98–111)
Creatinine, Ser: 0.79 mg/dL (ref 0.44–1.00)
GFR, Estimated: >60 mL/min (ref >60)
Glucose, Bld: 88 mg/dL (ref 70–99)
Potassium: 3.4 mmol/L — ABNORMAL LOW (ref 3.5–5.1)
Sodium: 137 mmol/L (ref 135–145)
Total Bilirubin: 1 mg/dL (ref 0.3–1.2)
Total Protein: 7.7 g/dL (ref 6.5–8.1)

## 2022-06-08 LAB — VALPROIC ACID LEVEL: Valproic Acid Lvl: 31 ug/mL — ABNORMAL LOW (ref 50.0–100.0)

## 2022-06-08 LAB — LACTIC ACID, PLASMA: Lactic Acid, Venous: 0.7 mmol/L (ref 0.5–1.9)

## 2022-06-08 LAB — SARS CORONAVIRUS 2 BY RT PCR: SARS Coronavirus 2 by RT PCR: POSITIVE — AB

## 2022-06-08 MED ORDER — VENLAFAXINE HCL ER 75 MG PO CP24
75.0000 mg | ORAL_CAPSULE | Freq: Every day | ORAL | Status: DC
  Administered 2022-06-09 – 2022-06-18 (×10): 75 mg via ORAL
  Filled 2022-06-08 (×10): qty 1

## 2022-06-08 MED ORDER — SODIUM CHLORIDE 0.9% FLUSH: 10.0000 mL | INTRAVENOUS | Status: DC | PRN

## 2022-06-08 MED ORDER — POTASSIUM CHLORIDE IN NACL 40-0.9 MEQ/L-% IV SOLN
INTRAVENOUS | Status: AC
  Filled 2022-06-08: qty 1000

## 2022-06-08 MED ORDER — LISINOPRIL 10 MG PO TABS
10.0000 mg | ORAL_TABLET | Freq: Every day | ORAL | Status: DC
  Administered 2022-06-09 – 2022-06-18 (×10): 10 mg via ORAL
  Filled 2022-06-08 (×10): qty 1

## 2022-06-08 MED ORDER — CHLORHEXIDINE GLUCONATE CLOTH 2 % EX PADS
6.0000 | MEDICATED_PAD | Freq: Every day | CUTANEOUS | Status: DC
  Administered 2022-06-09 – 2022-06-12 (×3): 6 via TOPICAL

## 2022-06-08 MED ORDER — MOLNUPIRAVIR EUA 200MG CAPSULE
4.0000 | ORAL_CAPSULE | Freq: Two times a day (BID) | ORAL | Status: AC
  Administered 2022-06-08 – 2022-06-13 (×9): 800 mg via ORAL
  Filled 2022-06-08: qty 4

## 2022-06-08 MED ORDER — SODIUM CHLORIDE 0.9% FLUSH
10.0000 mL | Freq: Two times a day (BID) | INTRAVENOUS | Status: DC
  Administered 2022-06-08 – 2022-06-18 (×15): 10 mL

## 2022-06-08 MED ORDER — ENOXAPARIN SODIUM 40 MG/0.4ML IJ SOSY
40.0000 mg | PREFILLED_SYRINGE | INTRAMUSCULAR | Status: DC
  Administered 2022-06-08 – 2022-06-10 (×3): 40 mg via SUBCUTANEOUS
  Filled 2022-06-08 (×3): qty 0.4

## 2022-06-08 MED ORDER — ONDANSETRON HCL 4 MG PO TABS: 4.0000 mg | ORAL_TABLET | Freq: Four times a day (QID) | ORAL | Status: DC | PRN

## 2022-06-08 MED ORDER — DIVALPROEX SODIUM 250 MG PO DR TAB
250.0000 mg | DELAYED_RELEASE_TABLET | Freq: Two times a day (BID) | ORAL | Status: DC
  Administered 2022-06-08 – 2022-06-18 (×20): 250 mg via ORAL
  Filled 2022-06-08 (×19): qty 1

## 2022-06-08 MED ORDER — POTASSIUM CHLORIDE 20 MEQ PO PACK
40.0000 meq | PACK | Freq: Once | ORAL | Status: AC
  Administered 2022-06-08: 40 meq via ORAL
  Filled 2022-06-08: qty 2

## 2022-06-08 MED ORDER — HYDRALAZINE HCL 20 MG/ML IJ SOLN: 10.0000 mg | Freq: Four times a day (QID) | INTRAMUSCULAR | Status: DC | PRN

## 2022-06-08 MED ORDER — ONDANSETRON HCL 4 MG/2ML IJ SOLN: 4.0000 mg | Freq: Four times a day (QID) | INTRAMUSCULAR | Status: DC | PRN

## 2022-06-08 MED ORDER — ACETAMINOPHEN 650 MG RE SUPP
650.0000 mg | Freq: Four times a day (QID) | RECTAL | Status: DC | PRN
  Filled 2022-06-08: qty 1

## 2022-06-08 MED ORDER — PANTOPRAZOLE SODIUM 40 MG PO TBEC
40.0000 mg | DELAYED_RELEASE_TABLET | Freq: Every day | ORAL | Status: DC
  Administered 2022-06-09 – 2022-06-18 (×10): 40 mg via ORAL
  Filled 2022-06-08 (×10): qty 1

## 2022-06-08 MED ORDER — SENNOSIDES-DOCUSATE SODIUM 8.6-50 MG PO TABS: 1.0000 | ORAL_TABLET | Freq: Every evening | ORAL | Status: DC | PRN

## 2022-06-08 MED ORDER — BISACODYL 5 MG PO TBEC: 5.0000 mg | DELAYED_RELEASE_TABLET | Freq: Every day | ORAL | Status: DC | PRN

## 2022-06-08 MED ORDER — IOHEXOL 300 MG/ML  SOLN
100.0000 mL | Freq: Once | INTRAMUSCULAR | Status: AC | PRN
  Administered 2022-06-08: 100 mL via INTRAVENOUS

## 2022-06-08 MED ORDER — ACETAMINOPHEN 325 MG PO TABS
650.0000 mg | ORAL_TABLET | Freq: Four times a day (QID) | ORAL | Status: DC | PRN
  Administered 2022-06-08 – 2022-06-12 (×7): 650 mg via ORAL
  Filled 2022-06-08 (×7): qty 2

## 2022-06-08 NOTE — Assessment & Plan Note (Signed)
Holding tramadol and gabapentin for now.

## 2022-06-08 NOTE — Assessment & Plan Note (Addendum)
Increased confusion from baseline in setting of COVID-19 viral infection.  Some confusion at baseline but normally can participate in meaningful conversation however unable to do so on admission.  Oriented to self only.  Likely some component of medication effect as well. -Start molnupiravir as above -Start gentle IV fluid hydration overnight -Hold tramadol and gabapentin

## 2022-06-08 NOTE — Assessment & Plan Note (Signed)
Normally ambulates with a walker.  Worsening generalized weakness in setting of COVID-19 infection.  Several falls at home without significant injury.  CT head and cervical spine reassuring. -Fall precautions -PT/OT eval

## 2022-06-08 NOTE — Assessment & Plan Note (Signed)
Likely cause of acute metabolic encephalopathy.  She is saturating well on room air and CXR without evidence of pneumonia or infiltrate. -Start molnupiravir -Supplemental oxygen as needed

## 2022-06-08 NOTE — ED Triage Notes (Signed)
Pt BIBA from home for fall, AMS, and weakness. Unwitnessed fall in bathroom, pt states she 'fell on her face'. Unable to ambulate today and alert only to self. Some confusion at baseline. EMS notes strong urine smell and incontinence. Hx breast cancer mets to brain. Last normal 24 hrs ago. Last infusion 2 months ago  162/112 HR 80 98% RA

## 2022-06-08 NOTE — ED Triage Notes (Signed)
Pt reports urgency and frequency, some dysuria. Hx of UTIs

## 2022-06-08 NOTE — Assessment & Plan Note (Signed)
Follows with oncology, Dr. Berneta Sages.  S/p b/l mastectomy 07/2012 followed by chemotherapy and radiation.  Found to have 2 frontal lobe tumors 11/2013 and underwent resection followed with whole brain radiation and chemotherapy.  Currently on Herceptin maintenance therapy.

## 2022-06-08 NOTE — Hospital Course (Signed)
Monica Mccoy is a 56 y.o. 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

## 2022-06-08 NOTE — ED Notes (Signed)
Unable to obtain blood cultures from peripheral veins on left arm. Unable to use right arm. Collected one set from her port.

## 2022-06-08 NOTE — Assessment & Plan Note (Signed)
No sign of seizure activity.  Valproate level slightly low at 31. -Continue Depakote 250 mg twice daily

## 2022-06-08 NOTE — ED Provider Notes (Signed)
Signout from Dr. Jeanell Sparrow.  56 year old female with history of metastatic breast cancer with mets to brain status post chemo and XRT.  Reportedly has been altered for a week and a half and had a fall today.  Will need admission.  Tested positive for COVID.  She is pending urinalysis and CAT scan of head, cervical spine abdomen and pelvis.  Physical Exam  BP (!) 165/106 (BP Location: Left Wrist)   Pulse 79   Temp 99 F (37.2 C) (Rectal)   Resp 18   SpO2 100%   Physical Exam  Procedures  Procedures  ED Course / MDM   Clinical Course as of 06/08/22 1711  Fri Jun 08, 2022  1616 Chest x-ray reviewed interpreted without acute any evidence of acute abnormality with lines in place and radiologist interpretation concurs [DR]  1505 Pleat metabolic panel reviewed and interpreted significant for mild hypokalemia with potassium of 3.4 mild hypocalcemia with calcium of 8.6 [DR]  1616 CBC reviewed and interpreted anemia stable with hemoglobin 11.2 [DR]  1617 Valproic acid level is reviewed and interpreted and does not appear to be toxic [DR]  1617 Initial lactic acid reviewed interpreted and normal at 0.7 [DR]  1630 COVID test reviewed and interpreted and is positive [DR]    Clinical Course User Index [DR] Pattricia Boss, MD   Medical Decision Making Amount and/or Complexity of Data Reviewed Labs: ordered. Radiology: ordered.  Risk Decision regarding hospitalization.   CT head cervical spine and abdomen do not show any acute traumatic findings.  Urinalysis without gross signs of infection.  Discussed with Dr. Posey Pronto [facility] hospitalist who will evaluate patient for admission.       Hayden Rasmussen, MD 06/09/22 1056

## 2022-06-08 NOTE — Assessment & Plan Note (Signed)
Continue lisinopril 10 mg daily.

## 2022-06-08 NOTE — H&P (Signed)
History and Physical    Monica Mccoy YIR:[id number] DOB: 1966-01-28 DOA: 06/08/2022  PCP: Haydee Salter, MD  Patient coming from: Home via EMS  I have personally briefly reviewed patient's old medical records in [place]  Chief Complaint: Altered mental status  HPI: Monica Mccoy is a 56 y.o. 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  History is limited from patient due to altered mental status and otherwise supplemented by EDP, chart review, and sister Benard Rink by phone.  Patient has had apparent increased lethargy and decreased level of interaction over the last 1.5 weeks.  She normally ambulates with the use of a walker.  She has some mild confusion at baseline but can usually participate in meaningful conversation.  She has been having progressive weakness and apparent unstable gait when trying to use her walker.  Today she was very lethargic and when trying to stand up out of her chair she fell.  She fell again later in the day in the bathroom.  She did not have any apparent significant injury.  EMS were called and she was brought to the ED for further evaluation.  Patient is oriented to self only at time of admission.  She reports chronic chest pain and lower extremity swelling which is not significantly changed from baseline.  ED Course  Labs/Imaging on admission: I have personally reviewed following labs and imaging studies.  Initial vitals showed BP 182/120, pulse 79, RR 20, temp 98.2 F, SPO2 97% on room air.  Labs show WBC 4.4, hemoglobin 11.2, platelets 213,000, sodium 137, potassium 3.4, bicarb 29, BUN 13, creatinine 0.79, serum glucose 88, lactic acid 0.7, valproate level 31.  SARS-CoV-2 PCR is positive.  Urinalysis  negative for UTI.  Portable chest x-ray negative for focal consolidation, edema, effusion.  Left-sided central venous port tip seen over the SVC.  CT head without contrast negative for acute intracranial pathology or evidence of metastatic disease.  CT cervical spine without contrast negative for evidence of acute traumatic injury.  Unchanged compression deformity of the C6 vertebral body with up to approximately 50% loss of vertebral body height noted.  CT abdomen/pelvis with contrast negative for acute findings.  Unchanged fibroid uterus, tiny gallstones without evidence of acute cholecystitis reported.  The hospitalist service was consulted to admit for further evaluation and management.  Review of Systems:  All systems reviewed and are negative except as documented in history of present illness above.   Past Medical History:  Diagnosis Date   Anxiety    Breast cancer metastasized to brain [facility])    Left breast   Chronic pain after cancer treatment    Depression    GERD (gastroesophageal reflux disease)    History of cancer chemotherapy    Breast cancer left breast   Seizure [facility])     Past Surgical History:  Procedure Laterality Date   BRAIN SURGERY     brain tumor   MASTECTOMY Bilateral    ORIF ANKLE FRACTURE Right     Social History:  reports that she has never smoked. She has never used smokeless tobacco. She reports current alcohol use. She reports current drug use. Drug: Marijuana.  Allergies  Allergen Reactions   Dilaudid [Hydromorphone Hcl] Other (See Comments)    Confusion & hallucinations   Morphine And Related  Other (See Comments)    Headaches    Penicillins    Tramadol     Family History  Problem Relation Age of Onset   Obesity Mother    Hypertension Mother    Hyperlipidemia Mother    Diabetes Mother    Heart disease Mother      Prior to Admission medications   Medication Sig Start Date End Date Taking? Authorizing Provider  diclofenac  (VOLTAREN) 50 MG EC tablet Take 1 tablet (50 mg total) by mouth 2 (two) times daily with a meal. Make sure eats or takes something before using this medicine 03/09/22   Haydee Salter, MD  divalproex (DEPAKOTE) 250 MG DR tablet Take 1 tablet (250 mg total) by mouth 2 (two) times daily. 05/21/22   Haydee Salter, MD  furosemide (LASIX) 40 MG tablet Take 1 tablet (40 mg total) by mouth daily. 05/21/22   Haydee Salter, MD  gabapentin (NEURONTIN) 300 MG capsule Take 2 capsules (600 mg total) by mouth 2 (two) times daily. 05/21/22   Haydee Salter, MD  ketoconazole (NIZORAL) 2 % cream Apply to bottoms of feet twice daily 04/06/22   Bronson Ing, DPM  lisinopril (ZESTRIL) 10 MG tablet Take 1 tablet (10 mg total) by mouth daily. 05/21/22   Haydee Salter, MD  MELATONIN GUMMIES PO Take 10 mg by mouth.    [provider]  mirabegron ER (MYRBETRIQ) 50 MG TB24 tablet Take 1 tablet (50 mg total) by mouth daily. 05/21/22   Haydee Salter, MD  pantoprazole (PROTONIX) 40 MG tablet Take 1 tablet (40 mg total) by mouth daily. 05/21/22   Haydee Salter, MD  polyethylene glycol (MIRALAX / GLYCOLAX) 17 g packet Take 17 g by mouth daily as needed for moderate constipation. 04/02/22   Davonna Belling, MD  thiamine 100 MG tablet Take 1 tablet (100 mg total) by mouth daily. 02/02/22   Nita Sells, MD  traMADol (ULTRAM) 50 MG tablet Take 1 tablet (50 mg total) by mouth 2 (two) times daily. 05/21/22   Haydee Salter, MD  venlafaxine XR (EFFEXOR XR) 75 MG 24 hr capsule Take 1 capsule (75 mg total) by mouth daily with breakfast. 05/21/22   Haydee Salter, MD    Physical Exam: Vitals:   06/08/22 1539 06/08/22 1724 06/08/22 1725 06/08/22 1730  BP: (!) 165/106 (!) 142/108  (!) 156/108  Pulse: 79 83  78  Resp: '18 16 17 19  '$ Temp:      TempSrc:      SpO2: 100% 99%  95%   Constitutional: Resting in bed with head elevated, NAD, calm Eyes: EOMI, lids and conjunctivae normal ENMT: Mucous membranes are  dry. Posterior pharynx clear of any exudate or lesions.Normal dentition.  Neck: normal, supple, no masses. Respiratory: clear to auscultation bilaterally, no wheezing, no crackles. Normal respiratory effort. No accessory muscle use.  Cardiovascular: Regular rate and rhythm, no murmurs / rubs / gallops.  +1 bilateral lower extremity edema.  Port-A-Cath in place left chest. Abdomen: no tenderness, no masses palpated. Musculoskeletal: no clubbing / cyanosis. No joint deformity upper and lower extremities. Good ROM, no contractures. Normal muscle tone.  Skin: no rashes, lesions, ulcers. No induration Neurologic: Sensation intact. Strength equal bilaterally. Psychiatric: Awake, alert, oriented to self only.  EKG: Personally reviewed. Sinus rhythm, rate 78, borderline prolonged PR interval.  No acute ischemic changes.  PR interval slightly more prolonged when compared to prior.  Assessment/Plan Principal Problem:   COVID-19  virus infection Active Problems:   Acute metabolic encephalopathy   Localization-related (focal) (partial) symptomatic epilepsy and epileptic syndromes with complex partial seizures, not intractable, without status epilepticus ([place])   Generalized weakness   Cancer of right breast metastatic to brain [facility])   Essential hypertension   Chemotherapy-induced neuropathy (HCC)   Shaana Formanek is a 56 y.o. 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  Assessment and Plan: * COVID-19 virus infection Likely cause of acute metabolic encephalopathy.  She is saturating well on room air and CXR without evidence of pneumonia or infiltrate. -Start molnupiravir -Supplemental oxygen as needed  Acute metabolic encephalopathy Increased confusion  from baseline in setting of COVID-19 viral infection.  Some confusion at baseline but normally can participate in meaningful conversation however unable to do so on admission.  Oriented to self only.  Likely some component of medication effect as well. -Start molnupiravir as above -Start gentle IV fluid hydration overnight -Hold tramadol and gabapentin  Generalized weakness Normally ambulates with a walker.  Worsening generalized weakness in setting of COVID-19 infection.  Several falls at home without significant injury.  CT head and cervical spine reassuring. -Fall precautions -PT/OT eval  Localization-related (focal) (partial) symptomatic epilepsy and epileptic syndromes with complex partial seizures, not intractable, without status epilepticus ([place]) No sign of seizure activity.  Valproate level slightly low at 31. 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 Berneta Sages.  S/p b/l mastectomy 07/2012 followed by chemotherapy and radiation.  Found to have 2 frontal lobe tumors 11/2013 and underwent resection followed with whole brain radiation and chemotherapy.  Currently on Herceptin maintenance therapy.  Chemotherapy-induced neuropathy (HCC) Holding tramadol and gabapentin for now.  Essential hypertension Continue lisinopril 10 mg daily.  DVT prophylaxis: enoxaparin (LOVENOX) injection 40 mg Start: 06/08/22 2200 Code Status: Full code, confirmed with patient's sister on admission Family Communication: Discussed with patient's Sister Benard Rink by phone Disposition Plan: From home, dispo pending clinical progress Consults called: None Severity of Illness: The appropriate patient status for this patient is OBSERVATION. Observation status is judged to be reasonable and necessary in order to provide the required intensity of service to ensure the patient's safety. The patient's presenting symptoms, physical exam findings,  and initial radiographic and laboratory data in the context of their medical condition is felt to place them at decreased risk for further clinical deterioration. Furthermore, it is anticipated that the patient will be medically stable for discharge from the hospital within 2 midnights of admission.   Zada Finders MD [facility]  If 7PM-7AM, please contact night-coverage [email or web address]  06/08/2022, 6:55 PM

## 2022-06-08 NOTE — ED Provider Notes (Signed)
[facility] DEPT Provider Note   CSN: [id number] Arrival date & time: 06/08/22  1339     History  Chief Complaint  Patient presents with   Weakness   Altered Mental Status   Fall    Monica Mccoy is a 56 y.o. female.  HPI 5 caveat patient is unable to give history herself History was obtained by phone from her sister [name].  56 yo female ho breast cancer who presents today with altered mental status.  [name] states that patient has history of urinary tract infections.  For the past 1 and half weeks she has had some increased lethargy and has not wanted to get up and move around or to have them cleaned her.  She states this is not normal for her.  She has had decreased p.o. intake.  She is on multiple medications that could contribute to altered mental status including Depakote and tramadol.  She states that she has been recently started on the tramadol again but was on it for about a year and did not have any problems with it.  She has a history of breast cancer is reported to be in remission.  Today she increased weakness and fell several times including in the bathroom.  I have not noted that she has had any fever.  She has been incontinent of urine.  Patient's last MRI of her brain was in June and did not show any static disease at that time.  Patient has had metastatic breast cancer and whole brain radiation and chemotherapy she is maintained on Herceptin.  Patient is treated by palliative care.  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  He was diagnosed in October 2013 he underwent bilateral mastectomies although by chemotherapy and radiation.  In April 2015 she was found to have 2 frontal lobe tumors and again underwent resection followed by radiation and chemotherapy.  She continues on Herceptin for maintenance therapy.  She is also managed with gabapentin  and tramadol     Home Medications Prior to Admission medications   Medication Sig Start Date End Date Taking? Authorizing Provider  diclofenac (VOLTAREN) 50 MG EC tablet Take 1 tablet (50 mg total) by mouth 2 (two) times daily with a meal. Make sure eats or takes something before using this medicine 03/09/22   Haydee Salter, MD  divalproex (DEPAKOTE) 250 MG DR tablet Take 1 tablet (250 mg total) by mouth 2 (two) times daily. 05/21/22   Haydee Salter, MD  furosemide (LASIX) 40 MG tablet Take 1 tablet (40 mg total) by mouth daily. 05/21/22   Haydee Salter, MD  gabapentin (NEURONTIN) 300 MG capsule Take 2 capsules (600 mg total) by mouth 2 (two) times daily. 05/21/22   Haydee Salter, MD  ketoconazole (NIZORAL) 2 % cream Apply to bottoms of feet twice daily 04/06/22   Bronson Ing, DPM  lisinopril (ZESTRIL) 10 MG tablet Take 1 tablet (10 mg total) by mouth daily. 05/21/22   Haydee Salter, MD  MELATONIN GUMMIES PO Take 10 mg by mouth.    [provider]  mirabegron ER (MYRBETRIQ) 50 MG TB24 tablet Take 1 tablet (50 mg total) by mouth daily. 05/21/22   Haydee Salter, MD  pantoprazole (PROTONIX) 40 MG tablet Take 1 tablet (40 mg total) by mouth daily. 05/21/22   Haydee Salter, MD  polyethylene glycol (MIRALAX / GLYCOLAX) 17 g  packet Take 17 g by mouth daily as needed for moderate constipation. 04/02/22   Davonna Belling, MD  thiamine 100 MG tablet Take 1 tablet (100 mg total) by mouth daily. 02/02/22   Nita Sells, MD  traMADol (ULTRAM) 50 MG tablet Take 1 tablet (50 mg total) by mouth 2 (two) times daily. 05/21/22   Haydee Salter, MD  venlafaxine XR (EFFEXOR XR) 75 MG 24 hr capsule Take 1 capsule (75 mg total) by mouth daily with breakfast. 05/21/22   Haydee Salter, MD      Allergies    Dilaudid [hydromorphone hcl], Morphine and related, Penicillins, and Tramadol    Review of Systems   Review of Systems  Physical Exam Updated Vital Signs BP (!) 165/106 (BP  Location: Left Wrist)   Pulse 79   Temp 99 F (37.2 C) (Rectal)   Resp 18   SpO2 100%  Physical Exam Vitals and nursing note reviewed.  Constitutional:      General: She is not in acute distress.    Appearance: Normal appearance. She is obese. She is ill-appearing.  HENT:     Head: Normocephalic.     Right Ear: External ear normal.     Left Ear: External ear normal.     Nose: Nose normal.     Mouth/Throat:     Pharynx: Oropharynx is clear.  Eyes:     Extraocular Movements: Extraocular movements intact.     Pupils: Pupils are equal, round, and reactive to light.  Cardiovascular:     Rate and Rhythm: Normal rate and regular rhythm.     Pulses: Normal pulses.     Heart sounds: Normal heart sounds.     Comments: Chest wall significant for status post bilateral mastectomy There is a port in place without tenderness or erythema Pulmonary:     Effort: Pulmonary effort is normal.     Breath sounds: Normal breath sounds.     Comments: Coughing on exam Abdominal:     General: There is distension.     Tenderness: There is abdominal tenderness.     Comments: Abdomen with diffuse tenderness to palpation  Musculoskeletal:        General: Normal range of motion.     Cervical back: Normal range of motion.  Skin:    General: Skin is warm and dry.     Capillary Refill: Capillary refill takes less than 2 seconds.  Neurological:     Mental Status: She is alert.     Comments: Patient is generally confused and unable to tell me anything beyond her name.  She is unable to tell me what happened to her today causing her to come into the ED. She is able to follow instructions and does not appear to have any palmar drift No leg drift is noted Sensation appears to be grossly intact     ED Results / Procedures / Treatments   Labs (all labs ordered are listed, but only abnormal results are displayed) Labs Reviewed  SARS CORONAVIRUS 2 BY RT PCR - Abnormal; Notable for the following components:       Result Value   SARS Coronavirus 2 by RT PCR POSITIVE (*)    All other components within normal limits  VALPROIC ACID LEVEL - Abnormal; Notable for the following components:   Valproic Acid Lvl 31 (*)    All other components within normal limits  CBC WITH DIFFERENTIAL/PLATELET - Abnormal; Notable for the following components:   Hemoglobin 11.2 (*)  Lymphs Abs 0.4 (*)    All other components within normal limits  COMPREHENSIVE METABOLIC PANEL - Abnormal; Notable for the following components:   Potassium 3.4 (*)    Calcium 8.6 (*)    AST 14 (*)    All other components within normal limits  URINALYSIS, ROUTINE W REFLEX MICROSCOPIC - Abnormal; Notable for the following components:   Color, Urine STRAW (*)    All other components within normal limits  CULTURE, BLOOD (ROUTINE X 2)  CULTURE, BLOOD (ROUTINE X 2)  LACTIC ACID, PLASMA  LACTIC ACID, PLASMA    EKG None  Radiology DG Chest Port 1 View  Result Date: 06/08/2022 CLINICAL DATA:  Cough altered mental status EXAM: PORTABLE CHEST 1 VIEW COMPARISON:  03/20/2022 FINDINGS: Left-sided central venous port tip over the SVC. Low lung volumes. No acute airspace disease or effusion. Borderline enlargement of cardiomediastinal silhouette, likely augmented by low lung volume. Clips in the right axilla. IMPRESSION: No active disease. Electronically Signed   By: Donavan Foil M.D.   On: 06/08/2022 15:22    Procedures Procedures    Medications Ordered in ED Medications  iohexol (OMNIPAQUE) 300 MG/ML solution 100 mL (100 mLs Intravenous Contrast Given 06/08/22 1645)    ED Course/ Medical Decision Making/ A&P Clinical Course as of 06/08/22 1723  Fri Jun 08, 2022  1616 Chest x-Melyna Huron reviewed interpreted without acute any evidence of acute abnormality with lines in place and radiologist interpretation concurs [DR]  6213 Pleat metabolic panel reviewed and interpreted significant for mild hypokalemia with potassium of 3.4 mild  hypocalcemia with calcium of 8.6 [DR]  1616 CBC reviewed and interpreted anemia stable with hemoglobin 11.2 [DR]  1617 Valproic acid level is reviewed and interpreted and does not appear to be toxic [DR]  1617 Initial lactic acid reviewed interpreted and normal at 0.7 [DR]  1630 COVID test reviewed and interpreted and is positive [DR]    Clinical Course User Index [DR] Pattricia Boss, MD                           Medical Decision Making 56 year old female history of metastatic breast cancer, status post total brain radiation, currently reported to be in remission.  Who presents today with reports of altered mental status.  She has reported generally been weaker and is now more confused. No focal deficits noted on exam.  Patient evaluated here with labs and imaging. DDX includes but is not limited to acute intracranial abnormality including bleeding, brain mets, and other intracranial catastrophes, generalized etiologies of altered mental status include infection, toxins, metabolic abnormalities. Patient was evaluated with labs and does not have evidence of hypoglycemia or other acute electrolyte abnormalities. Patient is on multiple medications that could contribute to altered mental status including Neurontin, valproic acid, and tramadol.  Valproic level is not in toxic range. patient evaluated for infection and found to be COVID-positive, urinalysis pending.   BP and temp normal- doubt sepsis CT scans pending at this time.  Care discussed with and signed out to Dr. Melina Copa  Amount and/or Complexity of Data Reviewed Labs: ordered. Decision-making details documented in ED Course. Radiology: ordered.          Final Clinical Impression(s) / ED Diagnoses Final diagnoses:  Altered mental status, unspecified altered mental status type  COVID-19 with multiple comorbidities    Rx / DC Orders ED Discharge Orders     None         Malcomb Gangemi,  Andee Poles, MD 06/08/22 1723

## 2022-06-09 ENCOUNTER — Observation Stay (HOSPITAL_COMMUNITY): Payer: Medicare Other

## 2022-06-09 DIAGNOSIS — F419 Anxiety disorder, unspecified: Secondary | ICD-10-CM | POA: Diagnosis present

## 2022-06-09 DIAGNOSIS — G40209 Localization-related (focal) (partial) symptomatic epilepsy and epileptic syndromes with complex partial seizures, not intractable, without status epilepticus: Secondary | ICD-10-CM | POA: Diagnosis present

## 2022-06-09 DIAGNOSIS — M549 Dorsalgia, unspecified: Secondary | ICD-10-CM | POA: Diagnosis present

## 2022-06-09 DIAGNOSIS — R531 Weakness: Secondary | ICD-10-CM | POA: Diagnosis present

## 2022-06-09 DIAGNOSIS — Z7189 Other specified counseling: Secondary | ICD-10-CM | POA: Diagnosis not present

## 2022-06-09 DIAGNOSIS — U071 COVID-19: Secondary | ICD-10-CM | POA: Diagnosis not present

## 2022-06-09 DIAGNOSIS — Z8249 Family history of ischemic heart disease and other diseases of the circulatory system: Secondary | ICD-10-CM | POA: Diagnosis not present

## 2022-06-09 DIAGNOSIS — G9341 Metabolic encephalopathy: Secondary | ICD-10-CM | POA: Diagnosis present

## 2022-06-09 DIAGNOSIS — W1830XA Fall on same level, unspecified, initial encounter: Secondary | ICD-10-CM | POA: Diagnosis present

## 2022-06-09 DIAGNOSIS — Z6841 Body Mass Index (BMI) 40.0 and over, adult: Secondary | ICD-10-CM | POA: Diagnosis not present

## 2022-06-09 DIAGNOSIS — C50911 Malignant neoplasm of unspecified site of right female breast: Secondary | ICD-10-CM | POA: Diagnosis not present

## 2022-06-09 DIAGNOSIS — F32A Depression, unspecified: Secondary | ICD-10-CM | POA: Diagnosis present

## 2022-06-09 DIAGNOSIS — G620 Drug-induced polyneuropathy: Secondary | ICD-10-CM | POA: Diagnosis present

## 2022-06-09 DIAGNOSIS — K219 Gastro-esophageal reflux disease without esophagitis: Secondary | ICD-10-CM | POA: Diagnosis present

## 2022-06-09 DIAGNOSIS — Z79899 Other long term (current) drug therapy: Secondary | ICD-10-CM | POA: Diagnosis not present

## 2022-06-09 DIAGNOSIS — E876 Hypokalemia: Secondary | ICD-10-CM | POA: Diagnosis present

## 2022-06-09 DIAGNOSIS — Z1501 Genetic susceptibility to malignant neoplasm of breast: Secondary | ICD-10-CM | POA: Diagnosis not present

## 2022-06-09 DIAGNOSIS — K5900 Constipation, unspecified: Secondary | ICD-10-CM | POA: Diagnosis present

## 2022-06-09 DIAGNOSIS — R4182 Altered mental status, unspecified: Secondary | ICD-10-CM | POA: Diagnosis present

## 2022-06-09 DIAGNOSIS — Z923 Personal history of irradiation: Secondary | ICD-10-CM | POA: Diagnosis not present

## 2022-06-09 DIAGNOSIS — C7931 Secondary malignant neoplasm of brain: Secondary | ICD-10-CM | POA: Diagnosis present

## 2022-06-09 DIAGNOSIS — Z515 Encounter for palliative care: Secondary | ICD-10-CM | POA: Diagnosis not present

## 2022-06-09 DIAGNOSIS — R32 Unspecified urinary incontinence: Secondary | ICD-10-CM | POA: Diagnosis present

## 2022-06-09 DIAGNOSIS — G8929 Other chronic pain: Secondary | ICD-10-CM | POA: Diagnosis present

## 2022-06-09 DIAGNOSIS — Z87440 Personal history of urinary (tract) infections: Secondary | ICD-10-CM | POA: Diagnosis not present

## 2022-06-09 DIAGNOSIS — Z9013 Acquired absence of bilateral breasts and nipples: Secondary | ICD-10-CM | POA: Diagnosis not present

## 2022-06-09 DIAGNOSIS — I10 Essential (primary) hypertension: Secondary | ICD-10-CM | POA: Diagnosis present

## 2022-06-09 LAB — BASIC METABOLIC PANEL
Anion gap: 4 — ABNORMAL LOW (ref 5–15)
BUN: 10 mg/dL (ref 6–20)
CO2: 28 mmol/L (ref 22–32)
Calcium: 8.3 mg/dL — ABNORMAL LOW (ref 8.9–10.3)
Chloride: 104 mmol/L (ref 98–111)
Creatinine, Ser: 0.83 mg/dL (ref 0.44–1.00)
GFR, Estimated: >60 mL/min (ref >60)
Glucose, Bld: 91 mg/dL (ref 70–99)
Potassium: 3.8 mmol/L (ref 3.5–5.1)
Sodium: 136 mmol/L (ref 135–145)

## 2022-06-09 LAB — CBC
HCT: 33 % — ABNORMAL LOW (ref 36.0–46.0)
Hemoglobin: 10.2 g/dL — ABNORMAL LOW (ref 12.0–15.0)
MCH: 28 pg (ref 26.0–34.0)
MCHC: 30.9 g/dL (ref 30.0–36.0)
MCV: 90.7 fL (ref 80.0–100.0)
Platelets: 199 10*3/uL (ref 150–400)
RBC: 3.64 MIL/uL — ABNORMAL LOW (ref 3.87–5.11)
RDW: 14 % (ref 11.5–15.5)
WBC: 2.8 10*3/uL — ABNORMAL LOW (ref 4.0–10.5)
nRBC: 0 % (ref 0.0–0.2)

## 2022-06-09 LAB — HIV ANTIBODY (ROUTINE TESTING W REFLEX): HIV Screen 4th Generation wRfx: NONREACTIVE

## 2022-06-09 MED ORDER — SODIUM CHLORIDE 0.9 % IV SOLN: INTRAVENOUS | Status: AC

## 2022-06-09 NOTE — Consult Note (Signed)
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  History was obtained by patient's sister upon admission due to patient's confusion.  Patient had been having progressive weakness and gait instability at home.  Patient had fallen twice at home for report prior to presentation without any signs of apparent injury.  Upon admission patient was found to be COVID-19 positive which is concerning to be the source of her encephalopathy.  Palliative care consulted to assist with complex medical decision making. Of note patient seen by outpatient palliative care at the cancer center by practitioner Jobe Gibbon, NP on 05/23/2022.  Discussed care with bedside RN prior to seeing patient.  Bedside RN notes the patient has been pleasantly confused this morning and responding "yes" to all questions.  After dawning appropriate PPE, presented to bedside.  Patient laying comfortably in bed and easily able to awaken to voice.  Introduced myself as a member of the palliative care team and a Automotive engineer.  Patient agreed she is seeing Lexine Baton in the outpatient setting.  Patient was feeling overall well at this time though she noted that her back was hurting.  She states that she normally takes tramadol at home to help with this pain.  Patient is unsure why I was wearing PPE so explained to her that she has been found to be  COVID-positive which was a surprise to the patient.  Patient denied any concerns of difficulty breathing or cough at this time though.  Patient did ask that her sister be updated about her being in the hospital and agreed would coordinate with hospitalist who was likely to reach out to her later today.  Patient did not have any concerns at this time except hopefully getting some medication for her back pain.  Acknowledged would informed RN of this. All questioned answered at that time and patient expressed appreciation for visit today.  Discussed care with bedside RN and primary hospitalist after visit with patient today.  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  I have reviewed the medical record, interviewed the patient and family, and examined the patient. The following aspects are pertinent.  Past Medical History:  Diagnosis Date   Anxiety    Breast cancer metastasized to brain [facility])    Left breast   Chronic pain after cancer treatment    Depression    GERD (gastroesophageal reflux disease)    History of cancer chemotherapy    Breast cancer left breast   Seizure [facility])    Social History   Socioeconomic History  Marital status: Divorced    Spouse name: Not on file   Number of children: 3   Years of education: Not on file   Highest education level: Not on file  Occupational History   Occupation: Disabled  Tobacco Use   Smoking status: Never   Smokeless tobacco: Never  Vaping Use   Vaping Use:  Never used  Substance and Sexual Activity   Alcohol use: Yes    Comment: On occasion   Drug use: Yes    Types: Marijuana    Comment: Occasional use   Sexual activity: Not Currently  Other Topics Concern   Not on file  Social History Narrative   Right handed   Drinks caffeine   Two story home   Social Determinants of Health   Financial Resource Strain: Low Risk  (03/27/2022)   Overall Financial Resource Strain (CARDIA)    Difficulty of Paying Living Expenses: Not hard at all  Food Insecurity: No Food Insecurity (03/27/2022)   Hunger Vital Sign    Worried About Running Out of Food in the Last Year: Never true    [place] in the Last Year: Never true  Transportation Needs: No Transportation Needs (03/27/2022)   PRAPARE - Hydrologist (Medical): No    Lack of Transportation (Non-Medical): No  Physical Activity: Insufficiently Active (03/27/2022)   Exercise Vital Sign    Days of Exercise per Week: 1 day    Minutes of Exercise per Session: 30 min  Stress: No Stress Concern Present (03/27/2022)   [place]    Feeling of Stress : Not at all  Social Connections: Moderately Isolated (03/27/2022)   Social Connection and Isolation Panel [NHANES]    Frequency of Communication with Friends and Family: Three times a week    Frequency of Social Gatherings with Friends and Family: Three times a week    Attends Religious Services: More than 4 times per year    Active Member of Clubs or Organizations: No    Attends Archivist Meetings: Never    Marital Status: Divorced   Family History  Problem Relation Age of Onset   Obesity Mother    Hypertension Mother    Hyperlipidemia Mother    Diabetes Mother    Heart disease Mother    Scheduled Meds:  Chlorhexidine Gluconate Cloth  6 each Topical Daily   divalproex  250 mg Oral BID   enoxaparin (LOVENOX) injection  40 mg Subcutaneous Q24H    lisinopril  10 mg Oral Daily   molnupiravir EUA  4 capsule Oral BID   pantoprazole  40 mg Oral Daily   sodium chloride flush  10-40 mL Intracatheter Q12H   venlafaxine XR  75 mg Oral Q breakfast   Continuous Infusions:  sodium chloride 75 mL/hr at 06/09/22 0814   PRN Meds:.acetaminophen **OR** acetaminophen, bisacodyl, hydrALAZINE, ondansetron **OR** ondansetron (ZOFRAN) IV, senna-docusate, sodium chloride flush Allergies  Allergen Reactions   Dilaudid [Hydromorphone Hcl] Other (See Comments)    Confusion & hallucinations   Morphine And Related Other (See Comments)    Headaches    CBC:    Component Value Date/Time   WBC 2.8 (L) 06/09/2022 0420   HGB 10.2 (L) 06/09/2022 0420   HGB 10.6 (L) 05/07/2022 0939   HCT 33.0 (L) 06/09/2022 0420   PLT 199 06/09/2022 0420   PLT 198 05/07/2022 0939   MCV 90.7 06/09/2022 0420   NEUTROABS 3.2  06/08/2022 1457   LYMPHSABS 0.4 (L) 06/08/2022 1457   MONOABS 0.7 06/08/2022 1457   EOSABS 0.1 06/08/2022 1457   BASOSABS 0.0 06/08/2022 1457   Comprehensive Metabolic Panel:    Component Value Date/Time   NA 136 06/09/2022 0420   NA 139 12/15/2019 0000   K 3.8 06/09/2022 0420   CL 104 06/09/2022 0420   CO2 28 06/09/2022 0420   BUN 10 06/09/2022 0420   BUN 10 12/15/2019 0000   CREATININE 0.83 06/09/2022 0420   CREATININE 1.05 (H) 05/07/2022 0939   GLUCOSE 91 06/09/2022 0420   CALCIUM 8.3 (L) 06/09/2022 0420   AST 14 (L) 06/08/2022 1457   AST 12 (L) 05/07/2022 0939   ALT 11 06/08/2022 1457   ALT 8 05/07/2022 0939   ALKPHOS 69 06/08/2022 1457   BILITOT 1.0 06/08/2022 1457   BILITOT 0.6 05/07/2022 0939   PROT 7.7 06/08/2022 1457   ALBUMIN 3.7 06/08/2022 1457    Physical Exam: Vital Signs: BP (!) 123/100 (BP Location: Right Wrist)   Pulse 78   Temp 97.9 F (36.6 C) (Oral)   Resp 17   Ht '5\' 6"'$  (1.676 m)   Wt (!) 144.6 kg   SpO2 98%   BMI 51.45 kg/m  SpO2: SpO2: 98 % O2 Device: O2 Device: Room Air Intake/Output Summary (Last 24  hours) at 06/09/2022 1004 Last data filed at 06/09/2022 0844 Gross per 24 hour  Intake 361.94 ml  Output 2300 ml  Net -1938.06 ml   LBM: Last BM Date :  (PTA) Baseline Weight: Weight: (!) 144.6 kg Most recent weight: Weight: (!) 144.6 kg  General: NAD, pleasant, laying in bed, able to answer simple questions though not complex medical decision making Eyes: conjunctiva clear, anicteric sclera HENT: normocephalic, atraumatic, moist mucous membranes Cardiovascular: RRR Respiratory: no increase work of breathing noted, not in respiratory distress Abdomen: Not distended Extremities: no edema in LE b/l Skin: no rashes or lesions on visible skin Neuro: Awake, interactive, able to answer simple questions though difficulties with complex ones Psych: Pleasant          Palliative Performance Scale: 50%            Additional Data Reviewed: Recent Labs    06/08/22 1457 06/09/22 0420  WBC 4.4 2.8*  HGB 11.2* 10.2*  PLT 213 199  NA 137 136  BUN 13 10  CREATININE 0.79 0.83    Imaging: CT ABDOMEN PELVIS W CONTRAST CLINICAL DATA:  Addendum pain with urinary frequency. History of breast cancer.  EXAM: CT ABDOMEN AND PELVIS WITH CONTRAST  TECHNIQUE: Multidetector CT imaging of the abdomen and pelvis was performed using the standard protocol following bolus administration of intravenous contrast.  RADIATION DOSE REDUCTION: This exam was performed according to the departmental dose-optimization program which includes automated exposure control, adjustment of the mA and/or kV according to patient size and/or use of iterative reconstruction technique.  CONTRAST:  132m OMNIPAQUE IOHEXOL 300 MG/ML  SOLN  COMPARISON:  CT abdomen/pelvis 03/29/2022  FINDINGS: Lower chest: The lung bases are clear. The imaged heart is unremarkable.  Hepatobiliary: Liver is unremarkable. Tiny gallstones are again suspected without evidence of acute cholecystitis. There is no biliary ductal  dilatation.  Pancreas: Unremarkable.  Spleen: Unremarkable.  Adrenals/Urinary Tract: The adrenals are unremarkable.  The kidneys are unremarkable, with no focal lesion, stone, hydronephrosis, or hydroureter. There is symmetric excretion of contrast into the collecting systems on the delayed images. The bladder is unremarkable.  Stomach/Bowel: The stomach is unremarkable.  There is no evidence of bowel obstruction. There is no abnormal bowel wall thickening or inflammatory change. Postsurgical changes in the distal large bowel are stable. There is a moderate stool burden throughout the colon. The appendix is normal.  Vascular/Lymphatic: The abdominal aorta is normal in course and caliber. The major branch vessels are patent. The main portal and splenic veins are patent. There is no abdominopelvic lymphadenopathy.  Reproductive: The enlarged and multilobulated fibroid uterus is unchanged. There is no adnexal mass.  Other: There is no ascites or free air.  Musculoskeletal: There is no acute osseous abnormality or suspicious osseous lesion.  IMPRESSION: 1. No acute finding in the abdomen or pelvis. 2. Moderate stool burden throughout the colon. 3. Unchanged fibroid uterus. 4. Probable tiny gallstones without evidence of acute cholecystitis.  Electronically Signed   By: Valetta Mole M.D.   On: 06/08/2022 17:31 CT Cervical Spine Wo Contrast CLINICAL DATA:  Altered mental status history of brain metastases from breast cancer  EXAM: CT HEAD WITHOUT CONTRAST  CT CERVICAL SPINE WITHOUT CONTRAST  TECHNIQUE: Multidetector CT imaging of the head and cervical spine was performed following the standard protocol without intravenous contrast. Multiplanar CT image reconstructions of the cervical spine were also generated.  RADIATION DOSE REDUCTION: This exam was performed according to the departmental dose-optimization program which includes automated exposure control,  adjustment of the mA and/or kV according to patient size and/or use of iterative reconstruction technique.  COMPARISON:  CT head 01/28/2022, brain MRI 01/29/2022  FINDINGS: CT HEAD FINDINGS  Brain: There is no acute intracranial hemorrhage, extra-axial fluid collection, or acute infarct.  Background parenchymal volume is stable. Encephalomalacia in the left frontal lobe underlying a craniotomy defect is unchanged. The ventricles are stable in size with unchanged mild ex vacuo dilatation of the left lateral ventricle. Confluent hypodensity in the supratentorial white matter is unchanged and may reflect chronic small-vessel ischemic change and/or posttreatment change. Calcifications in the brainstem and left cerebellar hemisphere are unchanged.  There is no mass lesion.  There is no mass effect or midline shift.  Vascular: No hyperdense vessel or unexpected calcification.  Skull: Status post left craniotomy. There is no suspicious osseous lesion or acute calvarial fracture.  Sinuses/Orbits: Paranasal sinuses are clear. A left lens implant is in place. The globes and orbits are otherwise unremarkable.  Other: Bilateral mastoid effusions are unchanged.  CT CERVICAL SPINE FINDINGS  Alignment: There is straightening of the normal cervical lordosis. There is no antero or retrolisthesis. There is no evidence of traumatic malalignment.  Skull base and vertebrae: Skull base alignment is maintained.  There is compression deformity of the C6 vertebral body with up to approximately 50% loss of vertebral body height centrally. There is no bony retropulsion. This was partially imaged on the CT chest/abdomen/pelvis from 10/20/2020 and is unchanged compared to the CT chest/abdomen/pelvis from 09/26/2021 when it was fully imaged.  The other imaged vertebral body heights are preserved (note that the T1 vertebral body is not included within the field of view.  Soft tissues and spinal  canal: No prevertebral fluid or swelling. No visible canal hematoma.  Disc levels: There is disc space narrowing and degenerative endplate change most advanced at C5-C6 and eccentric to the left. There is no evidence of high-grade spinal canal stenosis.  Upper chest: The lung apices are not included within the field of view. A left chest wall port is partially imaged.  Other: None.  IMPRESSION: 1. Stable noncontrast head CT with no acute intracranial  pathology. No evidence of metastatic disease by noncontrast CT. 2. No evidence of acute traumatic injury in the cervical spine. 3. Compression deformity of the C6 vertebral body with up to approximately 50% loss of vertebral body height is unchanged compared to the CT chest/abdomen/pelvis from 09/26/2021.  Electronically Signed   By: Valetta Mole M.D.   On: 06/08/2022 17:23 CT Head Wo Contrast CLINICAL DATA:  Altered mental status history of brain metastases from breast cancer  EXAM: CT HEAD WITHOUT CONTRAST  CT CERVICAL SPINE WITHOUT CONTRAST  TECHNIQUE: Multidetector CT imaging of the head and cervical spine was performed following the standard protocol without intravenous contrast. Multiplanar CT image reconstructions of the cervical spine were also generated.  RADIATION DOSE REDUCTION: This exam was performed according to the departmental dose-optimization program which includes automated exposure control, adjustment of the mA and/or kV according to patient size and/or use of iterative reconstruction technique.  COMPARISON:  CT head 01/28/2022, brain MRI 01/29/2022  FINDINGS: CT HEAD FINDINGS  Brain: There is no acute intracranial hemorrhage, extra-axial fluid collection, or acute infarct.  Background parenchymal volume is stable. Encephalomalacia in the left frontal lobe underlying a craniotomy defect is unchanged. The ventricles are stable in size with unchanged mild ex vacuo dilatation of the left lateral  ventricle. Confluent hypodensity in the supratentorial white matter is unchanged and may reflect chronic small-vessel ischemic change and/or posttreatment change. Calcifications in the brainstem and left cerebellar hemisphere are unchanged.  There is no mass lesion.  There is no mass effect or midline shift.  Vascular: No hyperdense vessel or unexpected calcification.  Skull: Status post left craniotomy. There is no suspicious osseous lesion or acute calvarial fracture.  Sinuses/Orbits: Paranasal sinuses are clear. A left lens implant is in place. The globes and orbits are otherwise unremarkable.  Other: Bilateral mastoid effusions are unchanged.  CT CERVICAL SPINE FINDINGS  Alignment: There is straightening of the normal cervical lordosis. There is no antero or retrolisthesis. There is no evidence of traumatic malalignment.  Skull base and vertebrae: Skull base alignment is maintained.  There is compression deformity of the C6 vertebral body with up to approximately 50% loss of vertebral body height centrally. There is no bony retropulsion. This was partially imaged on the CT chest/abdomen/pelvis from 10/20/2020 and is unchanged compared to the CT chest/abdomen/pelvis from 09/26/2021 when it was fully imaged.  The other imaged vertebral body heights are preserved (note that the T1 vertebral body is not included within the field of view.  Soft tissues and spinal canal: No prevertebral fluid or swelling. No visible canal hematoma.  Disc levels: There is disc space narrowing and degenerative endplate change most advanced at C5-C6 and eccentric to the left. There is no evidence of high-grade spinal canal stenosis.  Upper chest: The lung apices are not included within the field of view. A left chest wall port is partially imaged.  Other: None.  IMPRESSION: 1. Stable noncontrast head CT with no acute intracranial pathology. No evidence of metastatic disease by noncontrast  CT. 2. No evidence of acute traumatic injury in the cervical spine. 3. Compression deformity of the C6 vertebral body with up to approximately 50% loss of vertebral body height is unchanged compared to the CT chest/abdomen/pelvis from 09/26/2021.  Electronically Signed   By: Valetta Mole M.D.   On: 06/08/2022 17:23 DG Chest Port 1 View CLINICAL DATA:  Cough altered mental status  EXAM: PORTABLE CHEST 1 VIEW  COMPARISON:  03/20/2022  FINDINGS: Left-sided central venous port  tip over the SVC. Low lung volumes. No acute airspace disease or effusion. Borderline enlargement of cardiomediastinal silhouette, likely augmented by low lung volume. Clips in the right axilla.  IMPRESSION: No active disease.  Electronically Signed   By: Donavan Foil M.D.   On: 06/08/2022 15:22    I personally reviewed recent imaging.   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  History was obtained by patient's sister upon admission due to patient's confusion.  Patient had been having progressive weakness and gait instability at home.  Patient had fallen twice at home for report prior to presentation without any signs of apparent injury.  Upon admission patient was found to be COVID-19 positive which is concerning to be the source of her encephalopathy.  Palliative care consulted to assist with complex medical decision making. Of note patient seen by outpatient palliative care at the cancer center by practitioner Jobe Gibbon, NP on 05/23/2022.  # Complex medical decision making/goals of  care  -Patient only able to answer simple questioning due to worsening confusion and admission. EMR review notes baseline mentation difficulties though not like what is presently noted. Patient is able to admit when she has pain and that she would like medical information communicated with her sisters.  Hospitalist planning to call family later with updates.  -Informed patient she is positive for COVID-19 which she did not remember.  Explained to her that is why she is on precautions and her sisters are not there visiting her.  Hopeful that mental status can improve as she gets further out from acute illness.  -Patient is followed by out patient palliative care provider to assist in symptom management and establishing goals of care moving forward.  Patient has been diagnosed with metastatic breast cancer since 2013 and is currently receiving maintenance therapy with Herceptin.  Last visit with oncology was 04/03/2022 which noted stable disease.  -  Code Status: Full Code    -Did not address at this time as believe it would be beneficial to have patient's participation in conversation along with her family members.  This is difficult with patient actively having COVID infection which is likely worsening mentation at this time.  Recommend continued conversations regarding this as able moving forward.  # Symptom management  -Constipation noted on CT imaging    -Agree with bowel regimen.  At this time would consider providing patient with bisacodyl suppository to assist with already present constipation management.  May need enema if suppository does not assist with management. Moving forward patient would likely benefit from scheduled senna tab daily in addition to MiraLAX 17 g daily.  Of note, senna tabs can be increased up to 8 tabs in a 24-hour period as needed to assist with constipation management.  # Psycho-social/Spiritual Support:  - Support System: Curlene Dolphin and Margy Clarks   #  Discharge Planning: TBD  Thank you for allowing the palliative care team to participate in the care [facility].  Out of care team will continue to follow along with patient's hospital course.  Will inform patient's out patient palliative rider of admission as well to assist in coordination of care.  This  provider spent a total of 57 minutes providing patient's care.  Includes review of EMR, discussing care with other staff members involved in patient's medical care, obtaining relevant history and information from patient and/or patient's family, and personal review of imaging and lab work. Greater than 50% of the time was spent counseling and coordinating care related to the above assessment and plan.    Chelsea Aus, DO Palliative Care Provider PMT # [phone]

## 2022-06-09 NOTE — Progress Notes (Addendum)
PROGRESS NOTE    Monica Mccoy  MCN:[id number] DOB: May 31, 1966 DOA: 06/08/2022 PCP: Haydee Salter, MD    Brief Narrative:   Monica Mccoy is a 56 y.o. 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  History is limited from patient due to altered mental status and otherwise supplemented by EDP, chart review, and sister Benard Rink by phone.   Patient has had apparent increased lethargy and decreased level of interaction over the last 1.5 weeks.  She normally ambulates with the use of a walker.  She has some mild confusion at baseline but can usually participate in meaningful conversation.  She has been having progressive weakness and apparent unstable gait when trying to use her walker.  Today she was very lethargic and when trying to stand up out of her chair she fell.  She fell again later in the day in the bathroom.  She did not have any apparent significant injury.  EMS were called and she was brought to the ED for further evaluation.   Patient is oriented to self only at time of admission.  She reports chronic chest pain and lower extremity swelling which is not significantly changed from baseline.   In the ED, BP 182/120, pulse 79, RR 20, temp 98.2 F, SPO2 97% on room air. WBC 4.4, hemoglobin 11.2, platelets 213,000, sodium 137, potassium 3.4, bicarb 29, BUN 13, creatinine 0.79, serum glucose 88, lactic acid 0.7, valproate level 31. SARS-CoV-2 PCR is positive.  Urinalysis negative for UTI. Portable chest x-ray negative for focal consolidation, edema, effusion.  Left-sided central venous port tip seen over the SVC. CT head without contrast negative for acute intracranial pathology or evidence of metastatic disease. CT cervical spine without contrast  negative for evidence of acute traumatic injury.  Unchanged compression deformity of the C6 vertebral body with up to approximately 50% loss of vertebral body height noted. CT abdomen/pelvis with contrast negative for acute findings. Unchanged fibroid uterus, tiny gallstones without evidence of acute cholecystitis reported. The hospitalist service was consulted to admit for further evaluation and management.  Assessment & Plan:   COVID-19 virus infection Likely cause of acute metabolic encephalopathy.  She is saturating well on room air and CXR without evidence of pneumonia or infiltrate. --Molnupiravir '800mg'$  PO BID x 5 days --Steroids not indicated at this time as patient is acting well on room air, no acute pulmonary disease process noted on chest x-ray. --Continue airborne/contact isolation for 10 days from date of initial diagnosis   Acute metabolic encephalopathy Increased confusion from baseline in setting of COVID-19 viral infection.  Some confusion at baseline but normally can participate in meaningful conversation however unable to do so on admission.  Oriented to self only.  Likely some component of medication effect as well. --On molnupiravir as above --Hold tramadol and gabapentin until confusion improves --If confusion does not improve may need a repeat MRI brain with and without contrast to ensure no new brain metastatic lesions   Generalized weakness Normally ambulates with a walker.  Worsening generalized weakness in setting of COVID-19 infection.  Several falls at home without significant injury.  CT head and cervical spine reassuring. --Fall precautions --pending PT/OT eval   Localization-related (focal) (partial) symptomatic epilepsy and epileptic syndromes with complex partial seizures, not intractable, without status epilepticus ([place]) No sign  of seizure activity.  Valproate level slightly low at 31. --Continue Depakote 250 mg twice daily   Cancer of right breast  metastatic to brain [facility]) Follows with oncology, Dr. Lindi Adie.  S/p b/l mastectomy 07/2012 followed by chemotherapy and radiation.  Found to have 2 frontal lobe tumors 11/2013 and underwent resection followed with whole brain radiation and chemotherapy.  Recent MRI brain with and without contrast 01/29/2022 with no evidence of intracranial metastatic disease or other acute abnormality with noted chronic findings.  Currently on Herceptin maintenance therapy. --Palliative care consulted for goals of care and medical decision making   Chemotherapy-induced neuropathy (HCC) Holding tramadol and gabapentin for now.   Essential hypertension Continue lisinopril 10 mg daily.   DVT prophylaxis: enoxaparin (LOVENOX) injection 40 mg Start: 06/08/22 2200    Code Status: Full Code Family Communication: No family present at bedside this morning, updated patient's sister via telephone this morning  Disposition Plan:  Level of care: Med-Surg Status is: Observation The patient remains OBS appropriate and will d/c before 2 midnights.    Consultants:  Palliative care  Procedures:  none  Antimicrobials:  none   Subjective: Patient seen examined bedside, resting comfortably.  Pleasantly confused.  No family present, sister updated via telephone.  Knows that she is in the hospital but when asked what year it is she repeats "hospital".  Unable to obtain any further ROS due to her mental status currently.  Appears comfortable and not acute distress.  Oxygenating well on room air.  Objective: Vitals:   06/08/22 2035 06/09/22 0044 06/09/22 0448 06/09/22 0907  BP: (!) 152/102 121/85 (!) 145/87 (!) 123/100  Pulse: 80 90 89 78  Resp: '16 16 16 17  '$ Temp: 98.3 F (36.8 C) (!) 100.6 F (38.1 C) 100.3 F (37.9 C) 97.9 F (36.6 C)  TempSrc: Oral Oral Oral Oral  SpO2: 99% 93% 95% 98%  Weight: (!) 144.6 kg     Height: '5\' 6"'$  (1.676 m)       Intake/Output Summary (Last 24 hours) at 06/09/2022 1132 Last data  filed at 06/09/2022 1011 Gross per 24 hour  Intake 581.94 ml  Output 2300 ml  Net -1718.06 ml   Filed Weights   06/08/22 2035  Weight: (!) 144.6 kg    Examination:  Physical Exam: GEN: NAD, alert and oriented to place (hospital) but not time or situation, chronically ill in appearance, appears older than stated age HEENT: NCAT, PERRL, EOMI, sclera clear, MMM PULM: CTAB w/o wheezes/crackles, normal respiratory effort, on room air CV: RRR w/o M/G/R, left chest port noted in place GI: abd soft, NTND, NABS, no R/G/M MSK: no peripheral edema, moves all extremities independently NEURO: CN II-XII intact, no focal deficits, sensation to light touch intact PSYCH: Depressed mood, flat affect Integumentary: No concerning rash/lesions/wounds noted on exposed skin surfaces    Data Reviewed: I have personally reviewed following labs and imaging studies  CBC: Recent Labs  Lab 06/08/22 1457 06/09/22 0420  WBC 4.4 2.8*  NEUTROABS 3.2  --   HGB 11.2* 10.2*  HCT 36.0 33.0*  MCV 91.4 90.7  PLT 213 301   Basic Metabolic Panel: Recent Labs  Lab 06/08/22 1457 06/09/22 0420  NA 137 136  K 3.4* 3.8  CL 103 104  CO2 29 28  GLUCOSE 88 91  BUN 13 10  CREATININE 0.79 0.83  CALCIUM 8.6* 8.3*   GFR: Estimated Creatinine Clearance: 112.9 mL/min (by C-G formula based on SCr of 0.83 mg/dL). Liver Function Tests: Recent  Labs  Lab 06/08/22 1457  AST 14*  ALT 11  ALKPHOS 69  BILITOT 1.0  PROT 7.7  ALBUMIN 3.7   No results for input(s): "LIPASE", "AMYLASE" in the last 168 hours. No results for input(s): "AMMONIA" in the last 168 hours. Coagulation Profile: No results for input(s): "INR", "PROTIME" in the last 168 hours. Cardiac Enzymes: No results for input(s): "CKTOTAL", "CKMB", "CKMBINDEX", "TROPONINI" in the last 168 hours. BNP (last 3 results) No results for input(s): "PROBNP" in the last 8760 hours. HbA1C: No results for input(s): "HGBA1C" in the last 72 hours. CBG: No  results for input(s): "GLUCAP" in the last 168 hours. Lipid Profile: No results for input(s): "CHOL", "HDL", "LDLCALC", "TRIG", "CHOLHDL", "LDLDIRECT" in the last 72 hours. Thyroid Function Tests: No results for input(s): "TSH", "T4TOTAL", "FREET4", "T3FREE", "THYROIDAB" in the last 72 hours. Anemia Panel: No results for input(s): "VITAMINB12", "FOLATE", "FERRITIN", "TIBC", "IRON", "RETICCTPCT" in the last 72 hours. Sepsis Labs: Recent Labs  Lab 06/08/22 1458  LATICACIDVEN 0.7    Recent Results (from the past 240 hour(s))  SARS Coronavirus 2 by RT PCR (hospital order, performed in [facility] hospital lab) *cepheid single result test* Anterior Nasal Swab     Status: Abnormal   Collection Time: 06/08/22  3:28 PM   Specimen: Anterior Nasal Swab  Result Value Ref Range Status   SARS Coronavirus 2 by RT PCR POSITIVE (A) NEGATIVE Final    Comment: (NOTE) SARS-CoV-2 target nucleic acids are DETECTED  SARS-CoV-2 RNA is generally detectable in upper respiratory specimens  during the acute phase of infection.  Positive results are indicative  of the presence of the identified virus, but do not rule out bacterial infection or co-infection with other pathogens not detected by the test.  Clinical correlation with patient history and  other diagnostic information is necessary to determine patient infection status.  The expected result is negative.  Fact Sheet for Patients:   https://www.patel.info/   Fact Sheet for Healthcare Providers:   https://hall.com/    This test is not yet approved or cleared by the [place] FDA and  has been authorized for detection and/or diagnosis of SARS-CoV-2 by FDA under an Emergency Use Authorization (EUA).  This EUA will remain in effect (meaning this test can be used) for the duration of  the COVID-19 declaration under Section 564(b)(1)  of the Act, 21 U.S.C. section 360-bbb-3(b)(1), unless the authorization  is terminated or revoked sooner.   Performed at [facility], [place] [address] [place], [place] [address]   Blood culture (routine x 2)     Status: None (Preliminary result)   Collection Time: 06/08/22  4:36 PM   Specimen: BLOOD  Result Value Ref Range Status   Specimen Description   Final    BLOOD PORTA CATH Performed at [facility] [address], [place], [place] [address]    Special Requests   Final    BOTTLES DRAWN AEROBIC AND ANAEROBIC Blood Culture adequate volume Performed at [facility] [address], [place], [place] [address]    Culture   Final    NO GROWTH < 24 HOURS Performed at [facility] [address], [place], [place] [address]    Report Status PENDING  Incomplete         Radiology Studies: DG CHEST PORT 1 VIEW  Result Date: 06/09/2022 CLINICAL DATA:  Shortness of breath, cough EXAM: PORTABLE CHEST 1 VIEW COMPARISON:  06/08/2022 FINDINGS: Left chest port  remains in place. Stable heart size. No focal airspace consolidation, pleural effusion, or pneumothorax. Surgical clips in the right axilla. IMPRESSION: No active disease. Electronically Signed   By: Davina Poke D.O.   On: 06/09/2022 10:44   CT ABDOMEN PELVIS W CONTRAST  Result Date: 06/08/2022 CLINICAL DATA:  Addendum pain with urinary frequency. History of breast cancer. EXAM: CT ABDOMEN AND PELVIS WITH CONTRAST TECHNIQUE: Multidetector CT imaging of the abdomen and pelvis was performed using the standard protocol following bolus administration of intravenous contrast. RADIATION DOSE REDUCTION: This exam was performed according to the departmental dose-optimization program which includes automated exposure control, adjustment of the mA and/or kV according to patient size and/or use of iterative reconstruction technique. CONTRAST:  124m OMNIPAQUE IOHEXOL 300 MG/ML  SOLN COMPARISON:  CT abdomen/pelvis 03/29/2022 FINDINGS: Lower chest:  The lung bases are clear. The imaged heart is unremarkable. Hepatobiliary: Liver is unremarkable. Tiny gallstones are again suspected without evidence of acute cholecystitis. There is no biliary ductal dilatation. Pancreas: Unremarkable. Spleen: Unremarkable. Adrenals/Urinary Tract: The adrenals are unremarkable. The kidneys are unremarkable, with no focal lesion, stone, hydronephrosis, or hydroureter. There is symmetric excretion of contrast into the collecting systems on the delayed images. The bladder is unremarkable. Stomach/Bowel: The stomach is unremarkable. There is no evidence of bowel obstruction. There is no abnormal bowel wall thickening or inflammatory change. Postsurgical changes in the distal large bowel are stable. There is a moderate stool burden throughout the colon. The appendix is normal. Vascular/Lymphatic: The abdominal aorta is normal in course and caliber. The major branch vessels are patent. The main portal and splenic veins are patent. There is no abdominopelvic lymphadenopathy. Reproductive: The enlarged and multilobulated fibroid uterus is unchanged. There is no adnexal mass. Other: There is no ascites or free air. Musculoskeletal: There is no acute osseous abnormality or suspicious osseous lesion. IMPRESSION: 1. No acute finding in the abdomen or pelvis. 2. Moderate stool burden throughout the colon. 3. Unchanged fibroid uterus. 4. Probable tiny gallstones without evidence of acute cholecystitis. Electronically Signed   By: PValetta MoleM.D.   On: 06/08/2022 17:31   CT Head Wo Contrast  Result Date: 06/08/2022 CLINICAL DATA:  Altered mental status history of brain metastases from breast cancer EXAM: CT HEAD WITHOUT CONTRAST CT CERVICAL SPINE WITHOUT CONTRAST TECHNIQUE: Multidetector CT imaging of the head and cervical spine was performed following the standard protocol without intravenous contrast. Multiplanar CT image reconstructions of the cervical spine were also generated.  RADIATION DOSE REDUCTION: This exam was performed according to the departmental dose-optimization program which includes automated exposure control, adjustment of the mA and/or kV according to patient size and/or use of iterative reconstruction technique. COMPARISON:  CT head 01/28/2022, brain MRI 01/29/2022 FINDINGS: CT HEAD FINDINGS Brain: There is no acute intracranial hemorrhage, extra-axial fluid collection, or acute infarct. Background parenchymal volume is stable. Encephalomalacia in the left frontal lobe underlying a craniotomy defect is unchanged. The ventricles are stable in size with unchanged mild ex vacuo dilatation of the left lateral ventricle. Confluent hypodensity in the supratentorial white matter is unchanged and may reflect chronic small-vessel ischemic change and/or posttreatment change. Calcifications in the brainstem and left cerebellar hemisphere are unchanged. There is no mass lesion.  There is no mass effect or midline shift. Vascular: No hyperdense vessel or unexpected calcification. Skull: Status post left craniotomy. There is no suspicious osseous lesion or acute calvarial fracture. Sinuses/Orbits: Paranasal sinuses are clear. A left lens implant is in place. The globes and orbits are  otherwise unremarkable. Other: Bilateral mastoid effusions are unchanged. CT CERVICAL SPINE FINDINGS Alignment: There is straightening of the normal cervical lordosis. There is no antero or retrolisthesis. There is no evidence of traumatic malalignment. Skull base and vertebrae: Skull base alignment is maintained. There is compression deformity of the C6 vertebral body with up to approximately 50% loss of vertebral body height centrally. There is no bony retropulsion. This was partially imaged on the CT chest/abdomen/pelvis from 10/20/2020 and is unchanged compared to the CT chest/abdomen/pelvis from 09/26/2021 when it was fully imaged. The other imaged vertebral body heights are preserved (note that the T1  vertebral body is not included within the field of view. Soft tissues and spinal canal: No prevertebral fluid or swelling. No visible canal hematoma. Disc levels: There is disc space narrowing and degenerative endplate change most advanced at C5-C6 and eccentric to the left. There is no evidence of high-grade spinal canal stenosis. Upper chest: The lung apices are not included within the field of view. A left chest wall port is partially imaged. Other: None. IMPRESSION: 1. Stable noncontrast head CT with no acute intracranial pathology. No evidence of metastatic disease by noncontrast CT. 2. No evidence of acute traumatic injury in the cervical spine. 3. Compression deformity of the C6 vertebral body with up to approximately 50% loss of vertebral body height is unchanged compared to the CT chest/abdomen/pelvis from 09/26/2021. Electronically Signed   By: Valetta Mole M.D.   On: 06/08/2022 17:23   CT Cervical Spine Wo Contrast  Result Date: 06/08/2022 CLINICAL DATA:  Altered mental status history of brain metastases from breast cancer EXAM: CT HEAD WITHOUT CONTRAST CT CERVICAL SPINE WITHOUT CONTRAST TECHNIQUE: Multidetector CT imaging of the head and cervical spine was performed following the standard protocol without intravenous contrast. Multiplanar CT image reconstructions of the cervical spine were also generated. RADIATION DOSE REDUCTION: This exam was performed according to the departmental dose-optimization program which includes automated exposure control, adjustment of the mA and/or kV according to patient size and/or use of iterative reconstruction technique. COMPARISON:  CT head 01/28/2022, brain MRI 01/29/2022 FINDINGS: CT HEAD FINDINGS Brain: There is no acute intracranial hemorrhage, extra-axial fluid collection, or acute infarct. Background parenchymal volume is stable. Encephalomalacia in the left frontal lobe underlying a craniotomy defect is unchanged. The ventricles are stable in size with  unchanged mild ex vacuo dilatation of the left lateral ventricle. Confluent hypodensity in the supratentorial white matter is unchanged and may reflect chronic small-vessel ischemic change and/or posttreatment change. Calcifications in the brainstem and left cerebellar hemisphere are unchanged. There is no mass lesion.  There is no mass effect or midline shift. Vascular: No hyperdense vessel or unexpected calcification. Skull: Status post left craniotomy. There is no suspicious osseous lesion or acute calvarial fracture. Sinuses/Orbits: Paranasal sinuses are clear. A left lens implant is in place. The globes and orbits are otherwise unremarkable. Other: Bilateral mastoid effusions are unchanged. CT CERVICAL SPINE FINDINGS Alignment: There is straightening of the normal cervical lordosis. There is no antero or retrolisthesis. There is no evidence of traumatic malalignment. Skull base and vertebrae: Skull base alignment is maintained. There is compression deformity of the C6 vertebral body with up to approximately 50% loss of vertebral body height centrally. There is no bony retropulsion. This was partially imaged on the CT chest/abdomen/pelvis from 10/20/2020 and is unchanged compared to the CT chest/abdomen/pelvis from 09/26/2021 when it was fully imaged. The other imaged vertebral body heights are preserved (note that the T1 vertebral body  is not included within the field of view. Soft tissues and spinal canal: No prevertebral fluid or swelling. No visible canal hematoma. Disc levels: There is disc space narrowing and degenerative endplate change most advanced at C5-C6 and eccentric to the left. There is no evidence of high-grade spinal canal stenosis. Upper chest: The lung apices are not included within the field of view. A left chest wall port is partially imaged. Other: None. IMPRESSION: 1. Stable noncontrast head CT with no acute intracranial pathology. No evidence of metastatic disease by noncontrast CT. 2. No  evidence of acute traumatic injury in the cervical spine. 3. Compression deformity of the C6 vertebral body with up to approximately 50% loss of vertebral body height is unchanged compared to the CT chest/abdomen/pelvis from 09/26/2021. Electronically Signed   By: Valetta Mole M.D.   On: 06/08/2022 17:23   DG Chest Port 1 View  Result Date: 06/08/2022 CLINICAL DATA:  Cough altered mental status EXAM: PORTABLE CHEST 1 VIEW COMPARISON:  03/20/2022 FINDINGS: Left-sided central venous port tip over the SVC. Low lung volumes. No acute airspace disease or effusion. Borderline enlargement of cardiomediastinal silhouette, likely augmented by low lung volume. Clips in the right axilla. IMPRESSION: No active disease. Electronically Signed   By: Donavan Foil M.D.   On: 06/08/2022 15:22        Scheduled Meds:  Chlorhexidine Gluconate Cloth  6 each Topical Daily   divalproex  250 mg Oral BID   enoxaparin (LOVENOX) injection  40 mg Subcutaneous Q24H   lisinopril  10 mg Oral Daily   molnupiravir EUA  4 capsule Oral BID   pantoprazole  40 mg Oral Daily   sodium chloride flush  10-40 mL Intracatheter Q12H   venlafaxine XR  75 mg Oral Q breakfast   Continuous Infusions:  sodium chloride 75 mL/hr at 06/09/22 0814     LOS: 0 days    Time spent: 52 minutes spent on chart review, discussion with nursing staff, consultants, updating family and interview/physical exam; more than 50% of that time was spent in counseling and/or coordination of care.    Roniel Halloran J [place] DO [facility] Available via Epic secure chat 7am-7pm After these hours, please refer to coverage provider listed on [email or web address] 06/09/2022, 11:32 AM

## 2022-06-09 NOTE — Evaluation (Signed)
Physical Therapy Evaluation Patient Details Name: Monica Mccoy MRN: [id number] DOB: 11-13-1965 Today's Date: 06/09/2022  History of Present Illness  Monica Mccoy is a 56 y.o. 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 She was found to have COVID-19.  Clinical Impression  Pt admitted with above diagnosis.  Pt currently with functional limitations due to the deficits listed below (see PT Problem List). Pt will benefit from skilled PT to increase their independence and safety with mobility to allow discharge to the venue listed below.  Pt with poor coordination and following multi-step directions, as well as decreased orientation and safety awareness.  At this time would recommend SNF, but if family is able to provide 24 hour S and assist as needed then, home with HHPT could be an option.        Recommendations for follow up therapy are one component of a multi-disciplinary discharge planning process, led by the attending physician.  Recommendations may be updated based on patient status, additional functional criteria and insurance authorization.  Follow Up Recommendations Skilled nursing-short term rehab (<3 hours/day) Can patient physically be transported by private vehicle: No    Assistance Recommended at Discharge Frequent or constant Supervision/Assistance  Patient can return home with the following  A lot of help with walking and/or transfers;A lot of help with bathing/dressing/bathroom    Equipment Recommendations None recommended by PT  Recommendations for Other Services       Functional Status Assessment Patient has had a recent decline in their functional status and demonstrates the ability to make significant improvements in function  in a reasonable and predictable amount of time.     Precautions / Restrictions Precautions Precautions: Fall Restrictions Weight Bearing Restrictions: No      Mobility  Bed Mobility Overal bed mobility: Needs Assistance Bed Mobility: Sit to Supine       Sit to supine: Min assist   General bed mobility comments: Pt was found in recliner upon arrival with all 4 rails on bed up and confirmed she had gotten herself there.  Sit > supine, MIN A for legs due to fatigue.    Transfers Overall transfer level: Needs assistance   Transfers: Sit to/from Stand, Bed to chair/wheelchair/BSC Sit to Stand: Min guard   Step pivot transfers: Min assist, Mod assist       General transfer comment: Pt had somehow gotten herself into the chair by herself. With recliner> bed transfer step pivot transfer she needed MIN/MOD A for sequencing of steps and to trun hips fully before sitting down.  She was very fatigued from this. o2 97% on RA.    Ambulation/Gait                  Stairs            Wheelchair Mobility    Modified Rankin (Stroke Patients Only)       Balance Overall balance assessment: History of Falls                                           Pertinent Vitals/Pain Pain Assessment Pain Assessment: No/denies pain    Home Living Family/patient expects to be discharged to:: Private residence  Prior Function               Mobility Comments: Per chart review- she lived with either family or friends and slept on the first level.  Per nursing she was ambulating with a RW.       Hand Dominance        Extremity/Trunk Assessment        Lower Extremity Assessment Lower Extremity Assessment: Generalized weakness;Difficult to assess due to impaired cognition (difficulty moving LE with functional mobility, but this may be more due to neuro issues than a strength issue.)       Communication       Cognition Arousal/Alertness: Awake/alert Behavior During Therapy: Flat affect Overall Cognitive Status: Impaired/Different from baseline Area of Impairment: Orientation, Safety/judgement, Following commands, Awareness                 Orientation Level: Disoriented to, Place, Time, Situation     Following Commands: Follows one step commands with increased time, Follows one step commands inconsistently Safety/Judgement: Decreased awareness of safety, Decreased awareness of deficits Awareness: Intellectual            General Comments      Exercises     Assessment/Plan    PT Assessment Patient needs continued PT services  PT Problem List Decreased strength;Decreased activity tolerance;Decreased balance;Decreased mobility;Decreased coordination;Decreased cognition       PT Treatment Interventions DME instruction;Functional mobility training;Therapeutic exercise;Therapeutic activities;Gait training;Balance training;Neuromuscular re-education    PT Goals (Current goals can be found in the Care Plan section)  Acute Rehab PT Goals Patient Stated Goal: none stated PT Goal Formulation: Patient unable to participate in goal setting Time For Goal Achievement: 06/23/22 Potential to Achieve Goals: Fair    Frequency Min 2X/week     Co-evaluation               AM-PAC PT "6 Clicks" Mobility  Outcome Measure Help needed turning from your back to your side while in a flat bed without using bedrails?: A Little Help needed moving from lying on your back to sitting on the side of a flat bed without using bedrails?: A Little Help needed moving to and from a bed to a chair (including a wheelchair)?: A Little Help needed standing up from a chair using your arms (e.g., wheelchair or bedside chair)?: A Little Help needed to walk in hospital room?: A Lot Help needed climbing 3-5 steps with a railing? : A Lot 6 Click Score: 16    End of Session   Activity Tolerance: Patient  limited by fatigue Patient left: in bed;with call bell/phone within reach;with bed alarm set Nurse Communication: Mobility status PT Visit Diagnosis: Unsteadiness on feet (R26.81);Muscle weakness (generalized) (M62.81);Difficulty in walking, not elsewhere classified (R26.2)    Time: 0923-3007 PT Time Calculation (min) (ACUTE ONLY): 24 min   Charges:   PT Evaluation $PT Eval Moderate Complexity: 1 Mod PT Treatments $Therapeutic Activity: 8-22 mins        Donte Kary L. Tamala Julian, PT  06/09/2022   Galen Manila 06/09/2022, 4:15 PM

## 2022-06-09 NOTE — Progress Notes (Signed)
Report was given by ER nurse. Patient arrived to the unit via stretcher. Patient alert and oriented to self only. Patient oriented to the unit. Call light and patient's belongings within reach. Bed in lowest position, side rails x3 up, and bed alarm on. No further questions from patient at this time. Will continue to monitor patient.

## 2022-06-10 DIAGNOSIS — U071 COVID-19: Secondary | ICD-10-CM | POA: Diagnosis not present

## 2022-06-10 LAB — BASIC METABOLIC PANEL
Anion gap: 9 (ref 5–15)
BUN: 8 mg/dL (ref 6–20)
CO2: 28 mmol/L (ref 22–32)
Calcium: 9.3 mg/dL (ref 8.9–10.3)
Chloride: 99 mmol/L (ref 98–111)
Creatinine, Ser: 0.79 mg/dL (ref 0.44–1.00)
GFR, Estimated: >60 mL/min (ref >60)
Glucose, Bld: 98 mg/dL (ref 70–99)
Potassium: 3.7 mmol/L (ref 3.5–5.1)
Sodium: 136 mmol/L (ref 135–145)

## 2022-06-10 LAB — PHOSPHORUS: Phosphorus: 3.5 mg/dL (ref 2.5–4.6)

## 2022-06-10 LAB — MAGNESIUM: Magnesium: 2.2 mg/dL (ref 1.7–2.4)

## 2022-06-10 MED ORDER — AMLODIPINE BESYLATE 5 MG PO TABS
5.0000 mg | ORAL_TABLET | Freq: Every day | ORAL | Status: DC
  Administered 2022-06-10 – 2022-06-18 (×9): 5 mg via ORAL
  Filled 2022-06-10 (×9): qty 1

## 2022-06-10 MED ORDER — GABAPENTIN 300 MG PO CAPS
300.0000 mg | ORAL_CAPSULE | Freq: Two times a day (BID) | ORAL | Status: DC
  Administered 2022-06-10 – 2022-06-18 (×17): 300 mg via ORAL
  Filled 2022-06-10 (×18): qty 1

## 2022-06-10 NOTE — Evaluation (Signed)
Occupational Therapy Evaluation Patient Details Name: Monica Mccoy MRN: [id number] DOB: 04-25-1966 Today's Date: 06/10/2022   History of Present Illness Monica Mccoy is a 56 y.o. 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 She was found to have COVID-19.   Clinical Impression    patient is a 56  year old female who was admitted for above. patient was independent at homw per patient report. no family in room to confirm at this time. patient was min A for transfers with posterior leanining during inital sit to stand and one LOB posterioly with mod A to maintain standing balance with RW.Patient was noted to have decreased functional activity tolerance, decreased endurance,decreased processing of task, increased need for sequencing cues,  decreased standing balance, decreased safety awareness, and decreased knowledge of AD/AE impacting participation in ADLs. Patient would continue to benefit from skilled OT services at this time while admitted and after d/c to address noted deficits in order to improve overall safety and independence in ADLs.        Recommendations for follow up therapy are one component of a multi-disciplinary discharge planning process, led by the attending physician.  Recommendations may be updated based on patient status, additional functional criteria and insurance authorization.   Follow Up Recommendations  Skilled nursing-short term rehab (<3 hours/day)    Assistance Recommended at Discharge Frequent or constant Supervision/Assistance  Patient can return home with the following A little help with bathing/dressing/bathroom;A little help with walking and/or transfers;Assistance with cooking/housework;Direct supervision/assist for  financial management;Assist for transportation;Help with stairs or ramp for entrance;Direct supervision/assist for medications management    Functional Status Assessment  Patient has had a recent decline in their functional status and demonstrates the ability to make significant improvements in function in a reasonable and predictable amount of time.  Equipment Recommendations  Other (comment) (defer to next venue)    Recommendations for Other Services       Precautions / Restrictions Precautions Precautions: Fall Restrictions Weight Bearing Restrictions: No      Mobility Bed Mobility Overal bed mobility: Needs Assistance Bed Mobility: Sit to Supine, Supine to Sit     Supine to sit: Min guard Sit to supine: Min guard   General bed mobility comments: with increased time to process cues    Transfers                          Balance Overall balance assessment: History of Falls                                         ADL either performed or assessed with clinical judgement   ADL Overall ADL's : Needs assistance/impaired Eating/Feeding: Set up;Sitting   Grooming: Set up;Sitting   Upper Body Bathing: Sitting;Minimal assistance   Lower Body Bathing: Total assistance;Sit to/from stand;Sitting/lateral leans   Upper Body Dressing : Minimal assistance;Sitting   Lower Body Dressing: Total assistance;Sit to/from stand;Sitting/lateral leans   Toilet Transfer: Minimal assistance;Ambulation;Rolling walker (2 wheels) Toilet Transfer Details (indicate cue type and reason): with increased time and cues to keep RW close to her. patient was noted to need physical assist for walker use and safety during whole session Toileting-  Clothing Manipulation and Hygiene: Total assistance;Sit to/from stand Toileting - Clothing Manipulation Details (indicate cue type and reason): patient was in a soiled bed with TD for hygiene tasks with multiple loose stools.  patient returned to bed at end of session for NT to ensure that perineal area was clean.             Vision         Perception     Praxis      Pertinent Vitals/Pain Pain Assessment Pain Assessment: No/denies pain     Hand Dominance Right   Extremity/Trunk Assessment Upper Extremity Assessment Upper Extremity Assessment: Difficult to assess due to impaired cognition   Lower Extremity Assessment Lower Extremity Assessment: Defer to PT evaluation   Cervical / Trunk Assessment Cervical / Trunk Assessment: Normal   Communication Communication Communication: No difficulties   Cognition Arousal/Alertness: Awake/alert Behavior During Therapy: Flat affect Overall Cognitive Status: Impaired/Different from baseline Area of Impairment: Orientation, Safety/judgement, Following commands, Awareness                 Orientation Level: Disoriented to, Place, Time, Situation     Following Commands: Follows one step commands with increased time, Follows one step commands inconsistently Safety/Judgement: Decreased awareness of safety, Decreased awareness of deficits Awareness: Intellectual         General Comments       Exercises     Shoulder Instructions      Home Living Family/patient expects to be discharged to:: Private residence Living Arrangements: Other relatives (per patient report) Available Help at Discharge: Family;Available 24 hours/day Type of Home: House                           Additional Comments: info was obtained from chart reivew. patient was oriented to self.      Prior Functioning/Environment               Mobility Comments: Per chart review- she lived with either family or friends and slept on the first level.  Per nursing she was ambulating with a RW. ADLs Comments: independent ADLs, family assists with IADLs (cooking, meds, driving)        OT Problem List: Decreased activity tolerance;Impaired balance (sitting  and/or standing);Decreased safety awareness;Decreased knowledge of precautions;Decreased knowledge of use of DME or AE      OT Treatment/Interventions: Self-care/ADL training;Therapeutic exercise;Neuromuscular education;Therapeutic activities;Energy conservation;DME and/or AE instruction;Balance training;Patient/family education    OT Goals(Current goals can be found in the care plan section) Acute Rehab OT Goals Patient Stated Goal: to get to bathroom OT Goal Formulation: Patient unable to participate in goal setting Time For Goal Achievement: 06/24/22 Potential to Achieve Goals: Fair  OT Frequency: Min 2X/week    Co-evaluation              AM-PAC OT "6 Clicks" Daily Activity     Outcome Measure Help from another person eating meals?: A Little Help from another person taking care of personal grooming?: A Little Help from another person toileting, which includes using toliet, bedpan, or urinal?: A Lot Help from another person bathing (including washing, rinsing, drying)?: A Lot Help from another person to put on and taking off regular upper body clothing?: A Little Help from another person to put on and taking off regular lower body clothing?: A Lot 6 Click Score: 15   End of Session Equipment Utilized During Treatment: Rolling walker (2 wheels) Nurse Communication: Other (comment) (need  for assistance to ensure hygiene and need for increased checks about using bathroom.)  Activity Tolerance: Patient tolerated treatment well Patient left: in bed;with call bell/phone within reach;with bed alarm set  OT Visit Diagnosis: Unsteadiness on feet (R26.81);Other abnormalities of gait and mobility (R26.89);Muscle weakness (generalized) (M62.81)                Time: 7341-9379 OT Time Calculation (min): 42 min Charges:  OT General Charges $OT Visit: 1 Visit OT Evaluation $OT Eval Moderate Complexity: 1 Mod OT Treatments $Self Care/Home Management : 23-37 mins  Rennie Plowman, MS Acute  Rehabilitation Department Office# [phone]   Marcellina Millin 06/10/2022, 4:15 PM

## 2022-06-10 NOTE — Progress Notes (Signed)
PROGRESS NOTE    Monica Mccoy  ZHG:[id number] DOB: Oct 30, 1965 DOA: 06/08/2022 PCP: Haydee Salter, MD    Brief Narrative:   Monica Mccoy is a 56 y.o. 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  History is limited from patient due to altered mental status and otherwise supplemented by EDP, chart review, and sister Benard Rink by phone.   Patient has had apparent increased lethargy and decreased level of interaction over the last 1.5 weeks.  She normally ambulates with the use of a walker.  She has some mild confusion at baseline but can usually participate in meaningful conversation.  She has been having progressive weakness and apparent unstable gait when trying to use her walker.  Today she was very lethargic and when trying to stand up out of her chair she fell.  She fell again later in the day in the bathroom.  She did not have any apparent significant injury.  EMS were called and she was brought to the ED for further evaluation.   Patient is oriented to self only at time of admission.  She reports chronic chest pain and lower extremity swelling which is not significantly changed from baseline.   In the ED, BP 182/120, pulse 79, RR 20, temp 98.2 F, SPO2 97% on room air. WBC 4.4, hemoglobin 11.2, platelets 213,000, sodium 137, potassium 3.4, bicarb 29, BUN 13, creatinine 0.79, serum glucose 88, lactic acid 0.7, valproate level 31. SARS-CoV-2 PCR is positive.  Urinalysis negative for UTI. Portable chest x-ray negative for focal consolidation, edema, effusion.  Left-sided central venous port tip seen over the SVC. CT head without contrast negative for acute intracranial pathology or evidence of metastatic disease. CT cervical spine without contrast  negative for evidence of acute traumatic injury.  Unchanged compression deformity of the C6 vertebral body with up to approximately 50% loss of vertebral body height noted. CT abdomen/pelvis with contrast negative for acute findings. Unchanged fibroid uterus, tiny gallstones without evidence of acute cholecystitis reported. The hospitalist service was consulted to admit for further evaluation and management.  Assessment & Plan:   COVID-19 virus infection Likely cause of acute metabolic encephalopathy.  She is saturating well on room air and CXR without evidence of pneumonia or infiltrate. --Molnupiravir '800mg'$  PO BID x 5 days --Steroids not indicated at this time as patient is acting well on room air, no acute pulmonary disease process noted on chest x-ray. --Continue airborne/contact isolation for 10 days from date of initial diagnosis   Acute metabolic encephalopathy Increased confusion from baseline in setting of COVID-19 viral infection.  Some confusion at baseline but normally can participate in meaningful conversation however unable to do so on admission.  Oriented to self only.  Likely some component of medication effect as well. --On molnupiravir as above --Holding tramadol  --restart gabapentin at lower dose --If confusion does not improve may need a repeat MRI brain with and without contrast to ensure no new brain metastatic lesions   Generalized weakness Normally ambulates with a walker.  Worsening generalized weakness in setting of COVID-19 infection.  Several falls at home without significant injury.  CT head and cervical spine reassuring. --Fall precautions --PT recommending SNF placement, TOC for evaluation --OT consult: Pending   Localization-related (focal) (partial) symptomatic epilepsy and epileptic syndromes with complex partial seizures,  not intractable, without status epilepticus ([place]) No sign of seizure activity.  Valproate level slightly low at 31. --Continue Depakote  250 mg twice daily   Cancer of right breast metastatic to brain [facility] Follows with oncology, Dr. Lindi Adie.  S/p b/l mastectomy 07/2012 followed by chemotherapy and radiation.  Found to have 2 frontal lobe tumors 11/2013 and underwent resection followed with whole brain radiation and chemotherapy.  Recent MRI brain with and without contrast 01/29/2022 with no evidence of intracranial metastatic disease or other acute abnormality with noted chronic findings.  Currently on Herceptin maintenance therapy. --Palliative care consulted for goals of care and medical decision making   Chemotherapy-induced neuropathy (HCC) Holding tramadol  --restart gabapentin at '300mg'$  PO BID   Essential hypertension Continue lisinopril 10 mg daily, amlodipine 5 mg p.o. daily.   DVT prophylaxis: enoxaparin (LOVENOX) injection 40 mg Start: 06/08/22 2200    Code Status: Full Code Family Communication: No family present at bedside this morning, updated patient's sister via telephone yesterday morning  Disposition Plan:  Level of care: Med-Surg Status is: Inpatient Remains inpatient appropriate because: Will need SNF placement, anticipate likely need 10-day quarantine.  Before able for discharge     Consultants:  Palliative care  Procedures:  none  Antimicrobials:  none   Subjective: Patient seen examined bedside, resting comfortably.  Remains pleasantly confused.  No family present at bedside this morning.  Unable to obtain any further ROS due to her mental status currently.  Appears comfortable and not acute distress.  Oxygenating well on room air.  Objective: Vitals:   06/09/22 0907 06/09/22 1356 06/09/22 2233 06/10/22 0559  BP: (!) 123/100 (!) 149/112 (!) 160/103 (!) 148/136  Pulse: 78 77 77 75  Resp: '17 16 16 16  '$ Temp: 97.9 F (36.6 C) 98 F (36.7 C) 98.9 F (37.2 C) 98 F (36.7 C)  TempSrc: Oral Oral Oral Oral  SpO2: 98% 99% 100% 100%  Weight:      Height:        Intake/Output Summary (Last  24 hours) at 06/10/2022 1046 Last data filed at 06/10/2022 0939 Gross per 24 hour  Intake 1904.9 ml  Output 3300 ml  Net -1395.1 ml   Filed Weights   06/08/22 2035  Weight: (!) 144.6 kg    Examination:  Physical Exam: GEN: NAD, alert, but not oriented to place ([facility]), time (1993). Person (1993) nor situation, chronically ill in appearance, appears older than stated age 29: NCAT, PERRL, EOMI, sclera clear, MMM PULM: CTAB w/o wheezes/crackles, normal respiratory effort, on room air CV: RRR w/o M/G/R, left chest port noted in place GI: abd soft, NTND, NABS, no R/G/M MSK: no peripheral edema, moves all extremities independently NEURO: CN II-XII intact, no focal deficits, sensation to light touch intact PSYCH: Depressed mood, flat affect Integumentary: No concerning rash/lesions/wounds noted on exposed skin surfaces    Data Reviewed: I have personally reviewed following labs and imaging studies  CBC: Recent Labs  Lab 06/08/22 1457 06/09/22 0420  WBC 4.4 2.8*  NEUTROABS 3.2  --   HGB 11.2* 10.2*  HCT 36.0 33.0*  MCV 91.4 90.7  PLT 213 938   Basic Metabolic Panel: Recent Labs  Lab 06/08/22 1457 06/09/22 0420 06/10/22 0125  NA 137 136 136  K 3.4* 3.8 3.7  CL 103 104 99  CO2 '29 28 28  '$ GLUCOSE 88 91 98  BUN '13 10 8  '$ CREATININE 0.79 0.83 0.79  CALCIUM 8.6* 8.3* 9.3  MG  --   --  2.2  PHOS  --   --  3.5   GFR: Estimated Creatinine Clearance: 117.2 mL/min (by C-G formula based on SCr of 0.79 mg/dL). Liver Function Tests: Recent Labs  Lab 06/08/22 1457  AST 14*  ALT 11  ALKPHOS 69  BILITOT 1.0  PROT 7.7  ALBUMIN 3.7   No results for input(s): "LIPASE", "AMYLASE" in the last 168 hours. No results for input(s): "AMMONIA" in the last 168 hours. Coagulation Profile: No results for input(s): "INR", "PROTIME" in the last 168 hours. Cardiac Enzymes: No results for input(s): "CKTOTAL", "CKMB", "CKMBINDEX", "TROPONINI" in the last 168 hours. BNP (last  3 results) No results for input(s): "PROBNP" in the last 8760 hours. HbA1C: No results for input(s): "HGBA1C" in the last 72 hours. CBG: No results for input(s): "GLUCAP" in the last 168 hours. Lipid Profile: No results for input(s): "CHOL", "HDL", "LDLCALC", "TRIG", "CHOLHDL", "LDLDIRECT" in the last 72 hours. Thyroid Function Tests: No results for input(s): "TSH", "T4TOTAL", "FREET4", "T3FREE", "THYROIDAB" in the last 72 hours. Anemia Panel: No results for input(s): "VITAMINB12", "FOLATE", "FERRITIN", "TIBC", "IRON", "RETICCTPCT" in the last 72 hours. Sepsis Labs: Recent Labs  Lab 06/08/22 1458  LATICACIDVEN 0.7    Recent Results (from the past 240 hour(s))  SARS Coronavirus 2 by RT PCR (hospital order, performed in [facility] hospital lab) *cepheid single result test* Anterior Nasal Swab     Status: Abnormal   Collection Time: 06/08/22  3:28 PM   Specimen: Anterior Nasal Swab  Result Value Ref Range Status   SARS Coronavirus 2 by RT PCR POSITIVE (A) NEGATIVE Final    Comment: (NOTE) SARS-CoV-2 target nucleic acids are DETECTED  SARS-CoV-2 RNA is generally detectable in upper respiratory specimens  during the acute phase of infection.  Positive results are indicative  of the presence of the identified virus, but do not rule out bacterial infection or co-infection with other pathogens not detected by the test.  Clinical correlation with patient history and  other diagnostic information is necessary to determine patient infection status.  The expected result is negative.  Fact Sheet for Patients:   https://www.patel.info/   Fact Sheet for Healthcare Providers:   https://hall.com/    This test is not yet approved or cleared by the [place] FDA and  has been authorized for detection and/or diagnosis of SARS-CoV-2 by FDA under an Emergency Use Authorization (EUA).  This EUA will remain in effect (meaning this test can be used)  for the duration of  the COVID-19 declaration under Section 564(b)(1)  of the Act, 21 U.S.C. section 360-bbb-3(b)(1), unless the authorization is terminated or revoked sooner.   Performed at [facility], [place] [address], [place], [place] [address]   Blood culture (routine x 2)     Status: None (Preliminary result)   Collection Time: 06/08/22  4:36 PM   Specimen: BLOOD  Result Value Ref Range Status   Specimen Description   Final    BLOOD PORTA CATH Performed at [facility] [address] [place], [place] [address]    Special Requests   Final    BOTTLES DRAWN AEROBIC AND ANAEROBIC Blood Culture adequate volume Performed at [facility] [address] [place], [place] [address]    Culture   Final    NO GROWTH < 24 HOURS Performed at [facility] [address] [place], [place] [address]    Report Status PENDING  Incomplete  Radiology Studies: DG CHEST PORT 1 VIEW  Result Date: 06/09/2022 CLINICAL DATA:  Shortness of breath, cough EXAM: PORTABLE CHEST 1 VIEW COMPARISON:  06/08/2022 FINDINGS: Left chest port remains in place. Stable heart size. No focal airspace consolidation, pleural effusion, or pneumothorax. Surgical clips in the right axilla. IMPRESSION: No active disease. Electronically Signed   By: Davina Poke D.O.   On: 06/09/2022 10:44   CT ABDOMEN PELVIS W CONTRAST  Result Date: 06/08/2022 CLINICAL DATA:  Addendum pain with urinary frequency. History of breast cancer. EXAM: CT ABDOMEN AND PELVIS WITH CONTRAST TECHNIQUE: Multidetector CT imaging of the abdomen and pelvis was performed using the standard protocol following bolus administration of intravenous contrast. RADIATION DOSE REDUCTION: This exam was performed according to the departmental dose-optimization program which includes automated exposure control, adjustment of the mA and/or kV according to patient size and/or use of  iterative reconstruction technique. CONTRAST:  152m OMNIPAQUE IOHEXOL 300 MG/ML  SOLN COMPARISON:  CT abdomen/pelvis 03/29/2022 FINDINGS: Lower chest: The lung bases are clear. The imaged heart is unremarkable. Hepatobiliary: Liver is unremarkable. Tiny gallstones are again suspected without evidence of acute cholecystitis. There is no biliary ductal dilatation. Pancreas: Unremarkable. Spleen: Unremarkable. Adrenals/Urinary Tract: The adrenals are unremarkable. The kidneys are unremarkable, with no focal lesion, stone, hydronephrosis, or hydroureter. There is symmetric excretion of contrast into the collecting systems on the delayed images. The bladder is unremarkable. Stomach/Bowel: The stomach is unremarkable. There is no evidence of bowel obstruction. There is no abnormal bowel wall thickening or inflammatory change. Postsurgical changes in the distal large bowel are stable. There is a moderate stool burden throughout the colon. The appendix is normal. Vascular/Lymphatic: The abdominal aorta is normal in course and caliber. The major branch vessels are patent. The main portal and splenic veins are patent. There is no abdominopelvic lymphadenopathy. Reproductive: The enlarged and multilobulated fibroid uterus is unchanged. There is no adnexal mass. Other: There is no ascites or free air. Musculoskeletal: There is no acute osseous abnormality or suspicious osseous lesion. IMPRESSION: 1. No acute finding in the abdomen or pelvis. 2. Moderate stool burden throughout the colon. 3. Unchanged fibroid uterus. 4. Probable tiny gallstones without evidence of acute cholecystitis. Electronically Signed   By: PValetta MoleM.D.   On: 06/08/2022 17:31   CT Head Wo Contrast  Result Date: 06/08/2022 CLINICAL DATA:  Altered mental status history of brain metastases from breast cancer EXAM: CT HEAD WITHOUT CONTRAST CT CERVICAL SPINE WITHOUT CONTRAST TECHNIQUE: Multidetector CT imaging of the head and cervical spine was  performed following the standard protocol without intravenous contrast. Multiplanar CT image reconstructions of the cervical spine were also generated. RADIATION DOSE REDUCTION: This exam was performed according to the departmental dose-optimization program which includes automated exposure control, adjustment of the mA and/or kV according to patient size and/or use of iterative reconstruction technique. COMPARISON:  CT head 01/28/2022, brain MRI 01/29/2022 FINDINGS: CT HEAD FINDINGS Brain: There is no acute intracranial hemorrhage, extra-axial fluid collection, or acute infarct. Background parenchymal volume is stable. Encephalomalacia in the left frontal lobe underlying a craniotomy defect is unchanged. The ventricles are stable in size with unchanged mild ex vacuo dilatation of the left lateral ventricle. Confluent hypodensity in the supratentorial white matter is unchanged and may reflect chronic small-vessel ischemic change and/or posttreatment change. Calcifications in the brainstem and left cerebellar hemisphere are unchanged. There is no mass lesion.  There is no mass effect or midline shift. Vascular: No hyperdense vessel or unexpected calcification. Skull: Status  post left craniotomy. There is no suspicious osseous lesion or acute calvarial fracture. Sinuses/Orbits: Paranasal sinuses are clear. A left lens implant is in place. The globes and orbits are otherwise unremarkable. Other: Bilateral mastoid effusions are unchanged. CT CERVICAL SPINE FINDINGS Alignment: There is straightening of the normal cervical lordosis. There is no antero or retrolisthesis. There is no evidence of traumatic malalignment. Skull base and vertebrae: Skull base alignment is maintained. There is compression deformity of the C6 vertebral body with up to approximately 50% loss of vertebral body height centrally. There is no bony retropulsion. This was partially imaged on the CT chest/abdomen/pelvis from 10/20/2020 and is unchanged  compared to the CT chest/abdomen/pelvis from 09/26/2021 when it was fully imaged. The other imaged vertebral body heights are preserved (note that the T1 vertebral body is not included within the field of view. Soft tissues and spinal canal: No prevertebral fluid or swelling. No visible canal hematoma. Disc levels: There is disc space narrowing and degenerative endplate change most advanced at C5-C6 and eccentric to the left. There is no evidence of high-grade spinal canal stenosis. Upper chest: The lung apices are not included within the field of view. A left chest wall port is partially imaged. Other: None. IMPRESSION: 1. Stable noncontrast head CT with no acute intracranial pathology. No evidence of metastatic disease by noncontrast CT. 2. No evidence of acute traumatic injury in the cervical spine. 3. Compression deformity of the C6 vertebral body with up to approximately 50% loss of vertebral body height is unchanged compared to the CT chest/abdomen/pelvis from 09/26/2021. Electronically Signed   By: Valetta Mole M.D.   On: 06/08/2022 17:23   CT Cervical Spine Wo Contrast  Result Date: 06/08/2022 CLINICAL DATA:  Altered mental status history of brain metastases from breast cancer EXAM: CT HEAD WITHOUT CONTRAST CT CERVICAL SPINE WITHOUT CONTRAST TECHNIQUE: Multidetector CT imaging of the head and cervical spine was performed following the standard protocol without intravenous contrast. Multiplanar CT image reconstructions of the cervical spine were also generated. RADIATION DOSE REDUCTION: This exam was performed according to the departmental dose-optimization program which includes automated exposure control, adjustment of the mA and/or kV according to patient size and/or use of iterative reconstruction technique. COMPARISON:  CT head 01/28/2022, brain MRI 01/29/2022 FINDINGS: CT HEAD FINDINGS Brain: There is no acute intracranial hemorrhage, extra-axial fluid collection, or acute infarct. Background  parenchymal volume is stable. Encephalomalacia in the left frontal lobe underlying a craniotomy defect is unchanged. The ventricles are stable in size with unchanged mild ex vacuo dilatation of the left lateral ventricle. Confluent hypodensity in the supratentorial white matter is unchanged and may reflect chronic small-vessel ischemic change and/or posttreatment change. Calcifications in the brainstem and left cerebellar hemisphere are unchanged. There is no mass lesion.  There is no mass effect or midline shift. Vascular: No hyperdense vessel or unexpected calcification. Skull: Status post left craniotomy. There is no suspicious osseous lesion or acute calvarial fracture. Sinuses/Orbits: Paranasal sinuses are clear. A left lens implant is in place. The globes and orbits are otherwise unremarkable. Other: Bilateral mastoid effusions are unchanged. CT CERVICAL SPINE FINDINGS Alignment: There is straightening of the normal cervical lordosis. There is no antero or retrolisthesis. There is no evidence of traumatic malalignment. Skull base and vertebrae: Skull base alignment is maintained. There is compression deformity of the C6 vertebral body with up to approximately 50% loss of vertebral body height centrally. There is no bony retropulsion. This was partially imaged on the CT chest/abdomen/pelvis from  10/20/2020 and is unchanged compared to the CT chest/abdomen/pelvis from 09/26/2021 when it was fully imaged. The other imaged vertebral body heights are preserved (note that the T1 vertebral body is not included within the field of view. Soft tissues and spinal canal: No prevertebral fluid or swelling. No visible canal hematoma. Disc levels: There is disc space narrowing and degenerative endplate change most advanced at C5-C6 and eccentric to the left. There is no evidence of high-grade spinal canal stenosis. Upper chest: The lung apices are not included within the field of view. A left chest wall port is partially  imaged. Other: None. IMPRESSION: 1. Stable noncontrast head CT with no acute intracranial pathology. No evidence of metastatic disease by noncontrast CT. 2. No evidence of acute traumatic injury in the cervical spine. 3. Compression deformity of the C6 vertebral body with up to approximately 50% loss of vertebral body height is unchanged compared to the CT chest/abdomen/pelvis from 09/26/2021. Electronically Signed   By: Valetta Mole M.D.   On: 06/08/2022 17:23   DG Chest Port 1 View  Result Date: 06/08/2022 CLINICAL DATA:  Cough altered mental status EXAM: PORTABLE CHEST 1 VIEW COMPARISON:  03/20/2022 FINDINGS: Left-sided central venous port tip over the SVC. Low lung volumes. No acute airspace disease or effusion. Borderline enlargement of cardiomediastinal silhouette, likely augmented by low lung volume. Clips in the right axilla. IMPRESSION: No active disease. Electronically Signed   By: Donavan Foil M.D.   On: 06/08/2022 15:22        Scheduled Meds:  Chlorhexidine Gluconate Cloth  6 each Topical Daily   divalproex  250 mg Oral BID   enoxaparin (LOVENOX) injection  40 mg Subcutaneous Q24H   lisinopril  10 mg Oral Daily   molnupiravir EUA  4 capsule Oral BID   pantoprazole  40 mg Oral Daily   sodium chloride flush  10-40 mL Intracatheter Q12H   venlafaxine XR  75 mg Oral Q breakfast   Continuous Infusions:     LOS: 1 day    Time spent: 48 minutes spent on chart review, discussion with nursing staff, consultants, updating family and interview/physical exam; more than 50% of that time was spent in counseling and/or coordination of care.    Nuriyah Hanline J [place] DO [facility] Available via Epic secure chat 7am-7pm After these hours, please refer to coverage provider listed on [email or web address] 06/10/2022, 10:46 AM

## 2022-06-11 ENCOUNTER — Other Ambulatory Visit: Payer: Self-pay

## 2022-06-11 DIAGNOSIS — U071 COVID-19: Secondary | ICD-10-CM | POA: Diagnosis not present

## 2022-06-11 LAB — BASIC METABOLIC PANEL
Anion gap: 9 (ref 5–15)
BUN: 12 mg/dL (ref 6–20)
CO2: 27 mmol/L (ref 22–32)
Calcium: 8.8 mg/dL — ABNORMAL LOW (ref 8.9–10.3)
Chloride: 100 mmol/L (ref 98–111)
Creatinine, Ser: 0.91 mg/dL (ref 0.44–1.00)
GFR, Estimated: >60 mL/min (ref >60)
Glucose, Bld: 97 mg/dL (ref 70–99)
Potassium: 3.4 mmol/L — ABNORMAL LOW (ref 3.5–5.1)
Sodium: 136 mmol/L (ref 135–145)

## 2022-06-11 LAB — CBC
HCT: 41.4 % (ref 36.0–46.0)
Hemoglobin: 12.9 g/dL (ref 12.0–15.0)
MCH: 28 pg (ref 26.0–34.0)
MCHC: 31.2 g/dL (ref 30.0–36.0)
MCV: 89.8 fL (ref 80.0–100.0)
Platelets: 199 10*3/uL (ref 150–400)
RBC: 4.61 MIL/uL (ref 3.87–5.11)
RDW: 14 % (ref 11.5–15.5)
WBC: 2.1 10*3/uL — ABNORMAL LOW (ref 4.0–10.5)
nRBC: 0 % (ref 0.0–0.2)

## 2022-06-11 MED ORDER — LORAZEPAM 2 MG/ML IJ SOLN
1.0000 mg | INTRAMUSCULAR | Status: AC
  Filled 2022-06-11: qty 1

## 2022-06-11 MED ORDER — POTASSIUM CHLORIDE CRYS ER 20 MEQ PO TBCR
40.0000 meq | EXTENDED_RELEASE_TABLET | Freq: Once | ORAL | Status: AC
  Administered 2022-06-11: 40 meq via ORAL
  Filled 2022-06-11: qty 2

## 2022-06-11 MED ORDER — ENOXAPARIN SODIUM 80 MG/0.8ML IJ SOSY
70.0000 mg | PREFILLED_SYRINGE | INTRAMUSCULAR | Status: DC
  Administered 2022-06-11 – 2022-06-17 (×7): 70 mg via SUBCUTANEOUS
  Filled 2022-06-11 (×7): qty 0.8

## 2022-06-11 NOTE — Progress Notes (Signed)
  Daily Progress Note   Patient Name: Monica Mccoy       Date: 06/11/2022 DOB: 1966-05-19  Age: 56 y.o. MRN#: [id number] Attending Physician: [place] Eric J, DO Primary Care Physician: Haydee Salter, MD Admit Date: 06/08/2022 Length of Stay: 2 days  Patient remains on COVID isolation; continuing to recover from acute illness insult.  Patient participating with physical therapy with plan for patient to discharge to SNF for rehab.  Spoke with patient's outpatient palliative care provider, Alda Lea NP, who has established relationship with patient and family.  Outpatient palliative care provider will be continuing discussions regarding patient's goals for medical care moving forward after her discharge and rehab time SNF.  At this time, no further needs from the palliative care team perspective.  Palliative care team will sign off.  Please reach out if palliative care team can be of assistance in the future.  Chelsea Aus, DO Palliative Care Provider PMT # [phone]

## 2022-06-11 NOTE — NC FL2 (Signed)
[place] LEVEL OF CARE SCREENING TOOL     IDENTIFICATION  Patient Name: Monica Mccoy Birthdate: 01-Dec-1965 Sex: female Admission Date (Current Location): 06/08/2022  [facility] and [place] Number:  Herbalist and Address:  [facility],  [place], [place]      Provider Number: [id number]  Attending Physician Name and Address:  [place] Eric J, DO  Relative Name and Phone Number:  Wilhemina Cash [phone]    Current Level of Care: Hospital Recommended Level of Care: [place] Prior Approval Number:    Date Approved/Denied:   PASRR Number: [id number]  Discharge Plan: SNF    Current Diagnoses: Patient Active Problem List   Diagnosis Date Noted   Palliative care encounter 06/09/2022   Goals of care, counseling/discussion 06/09/2022   Counseling and coordination of care 06/09/2022   Altered mental status 29/79/8921   Acute metabolic encephalopathy 19/41/7408   COVID-19 virus infection 06/08/2022   Generalized weakness 06/08/2022   Tendinitis of both ankles 04/25/2022   Pedal edema 04/13/2022   Cholelithiasis 10/16/2021   Uterine fibroid 10/16/2021   Aortic atherosclerosis ([place]) 10/16/2021   Chemotherapy-induced neuropathy ([place]) 09/11/2021   Localization-related (focal) (partial) symptomatic epilepsy and epileptic syndromes with complex partial seizures, not intractable, without status epilepticus ([place]) 09/11/2021   Essential hypertension 05/30/2021   Port-A-Cath in place 01/16/2021   Migraine without aura 12/19/2020   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[place]) 09/22/2020   Gastroesophageal reflux disease without esophagitis 06/29/2020   Other chronic pain 06/29/2020   Moderate episode of recurrent major depressive disorder ([place]) 06/29/2020   Urinary incontinence 06/29/2020    Orientation RESPIRATION BLADDER Height & Weight     Self  Normal Incontinent, External catheter Weight: (!)  318 lb 12.6 oz (144.6 kg) Height:  '5\' 6"'$  (167.6 cm)  BEHAVIORAL SYMPTOMS/MOOD NEUROLOGICAL BOWEL NUTRITION STATUS      Continent Diet (Regular)  AMBULATORY STATUS COMMUNICATION OF NEEDS Skin   Limited Assist Verbally Normal                       Personal Care Assistance Level of Assistance  Bathing, Feeding, Dressing Bathing Assistance: Maximum assistance Feeding assistance: Limited assistance Dressing Assistance: Maximum assistance     Functional Limitations Info  Sight, Hearing, Speech Sight Info: Adequate Hearing Info: Impaired Speech Info: Adequate    SPECIAL CARE FACTORS FREQUENCY  PT (By licensed PT), OT (By licensed OT)     PT Frequency: 5x/wk OT Frequency: 5x/wk            Contractures Contractures Info: Not present    Additional Factors Info  Code Status, Allergies, Psychotropic Code Status Info: FULL Allergies Info: Dilaudid (Hydromorphone Hcl), Morphine And Related Psychotropic Info: See MAR         Current Medications (06/11/2022):  This is the current hospital active medication list Current Facility-Administered Medications  Medication Dose Route Frequency Provider Last Rate Last Admin   acetaminophen (TYLENOL) tablet 650 mg  650 mg Oral Q6H PRN Lenore Cordia, MD   650 mg at 06/10/22 2209   Or   acetaminophen (TYLENOL) suppository 650 mg  650 mg Rectal Q6H PRN Lenore Cordia, MD       amLODipine (NORVASC) tablet 5 mg  5 mg Oral Daily [place] Donnamarie Poag, DO   5 mg at 06/11/22 1448   bisacodyl (DULCOLAX) EC tablet 5 mg  5 mg Oral Daily PRN Lenore Cordia, MD  Chlorhexidine Gluconate Cloth 2 % PADS 6 each  6 each Topical Daily Lenore Cordia, MD   6 each at 06/10/22 1009   divalproex (DEPAKOTE) DR tablet 250 mg  250 mg Oral BID Zada Finders R, MD   250 mg at 06/11/22 0928   enoxaparin (LOVENOX) injection 40 mg  40 mg Subcutaneous Q24H Zada Finders R, MD   40 mg at 06/10/22 2209   gabapentin (NEURONTIN) capsule 300 mg  300 mg Oral BID  [place] Eric J, DO   300 mg at 06/11/22 2876   hydrALAZINE (APRESOLINE) injection 10 mg  10 mg Intravenous Q6H PRN Lenore Cordia, MD       lisinopril (ZESTRIL) tablet 10 mg  10 mg Oral Daily Zada Finders R, MD   10 mg at 06/11/22 8115   molnupiravir EUA (LAGEVRIO) capsule 800 mg  4 capsule Oral BID Zada Finders R, MD   800 mg at 06/11/22 0928   ondansetron (ZOFRAN) tablet 4 mg  4 mg Oral Q6H PRN Lenore Cordia, MD       Or   ondansetron (ZOFRAN) injection 4 mg  4 mg Intravenous Q6H PRN Lenore Cordia, MD       pantoprazole (PROTONIX) EC tablet 40 mg  40 mg Oral Daily Zada Finders R, MD   40 mg at 06/11/22 0929   senna-docusate (Senokot-S) tablet 1 tablet  1 tablet Oral QHS PRN Zada Finders R, MD       sodium chloride flush (NS) 0.9 % injection 10-40 mL  10-40 mL Intracatheter Q12H Zada Finders R, MD   10 mL at 06/09/22 2237   sodium chloride flush (NS) 0.9 % injection 10-40 mL  10-40 mL Intracatheter PRN Lenore Cordia, MD       venlafaxine XR (EFFEXOR-XR) 24 hr capsule 75 mg  75 mg Oral Q breakfast Lenore Cordia, MD   75 mg at 06/11/22 7262     Discharge Medications: Please see discharge summary for a list of discharge medications.  Relevant Imaging Results:  Relevant Lab Results:   Additional Information SSN: [id number]  Vassie Moselle, LCSW

## 2022-06-11 NOTE — Progress Notes (Addendum)
PROGRESS NOTE    Monica Mccoy  HKV:[id number] DOB: 05/12/1966 DOA: 06/08/2022 PCP: Haydee Salter, MD    Brief Narrative:   Monica Mccoy is a 56 y.o. 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  History is limited from patient due to altered mental status and otherwise supplemented by EDP, chart review, and sister Benard Rink by phone.   Patient has had apparent increased lethargy and decreased level of interaction over the last 1.5 weeks.  She normally ambulates with the use of a walker.  She has some mild confusion at baseline but can usually participate in meaningful conversation.  She has been having progressive weakness and apparent unstable gait when trying to use her walker.  Today she was very lethargic and when trying to stand up out of her chair she fell.  She fell again later in the day in the bathroom.  She did not have any apparent significant injury.  EMS were called and she was brought to the ED for further evaluation.   Patient is oriented to self only at time of admission.  She reports chronic chest pain and lower extremity swelling which is not significantly changed from baseline.   In the ED, BP 182/120, pulse 79, RR 20, temp 98.2 F, SPO2 97% on room air. WBC 4.4, hemoglobin 11.2, platelets 213,000, sodium 137, potassium 3.4, bicarb 29, BUN 13, creatinine 0.79, serum glucose 88, lactic acid 0.7, valproate level 31. SARS-CoV-2 PCR is positive.  Urinalysis negative for UTI. Portable chest x-ray negative for focal consolidation, edema, effusion.  Left-sided central venous port tip seen over the SVC. CT head without contrast negative for acute intracranial pathology or evidence of metastatic disease. CT cervical spine without contrast  negative for evidence of acute traumatic injury.  Unchanged compression deformity of the C6 vertebral body with up to approximately 50% loss of vertebral body height noted. CT abdomen/pelvis with contrast negative for acute findings. Unchanged fibroid uterus, tiny gallstones without evidence of acute cholecystitis reported. The hospitalist service was consulted to admit for further evaluation and management.  Assessment & Plan:   COVID-19 virus infection Likely cause of acute metabolic encephalopathy.  She is saturating well on room air and CXR without evidence of pneumonia or infiltrate. --Molnupiravir '800mg'$  PO BID x 5 days --Steroids not indicated at this time as patient is acting well on room air, no acute pulmonary disease process noted on chest x-ray. --Continue airborne/contact isolation for 10 days from date of initial diagnosis   Acute metabolic encephalopathy Increased confusion from baseline in setting of COVID-19 viral infection.  Some confusion at baseline but normally can participate in meaningful conversation however unable to do so on admission.  Oriented to self only.  Likely some component of medication effect as well. --On molnupiravir as above --Holding tramadol  --restart gabapentin at lower dose -- Continues with confusion, will check MRI brain with and without contrast to ensure no new metastatic lesions present   Generalized weakness Normally ambulates with a walker.  Worsening generalized weakness in setting of COVID-19 infection.  Several falls at home without significant injury.  CT head and cervical spine reassuring. --Fall precautions --PT/OT recommending SNF placement, TOC for evaluation  Hypokalemia Potassium 3.4, will replete. --Repeat electrolytes in the a.m.   Localization-related (focal) (partial) symptomatic epilepsy and epileptic  syndromes with complex partial seizures, not intractable, without status epilepticus ([place]) No sign of seizure activity.   Valproate level slightly low at 31. --Continue Depakote 250 mg twice daily   Cancer of right breast metastatic to brain [facility]) Follows with oncology, Dr. Lindi Adie.  S/p b/l mastectomy 07/2012 followed by chemotherapy and radiation.  Found to have 2 frontal lobe tumors 11/2013 and underwent resection followed with whole brain radiation and chemotherapy.  Recent MRI brain with and without contrast 01/29/2022 with no evidence of intracranial metastatic disease or other acute abnormality with noted chronic findings.  Currently on Herceptin maintenance therapy. --Palliative care consulted for goals of care and medical decision making   Chemotherapy-induced neuropathy (HCC) Holding tramadol  --restart gabapentin at '300mg'$  PO BID   Essential hypertension Continue lisinopril 10 mg daily, amlodipine 5 mg p.o. daily.   DVT prophylaxis: enoxaparin (LOVENOX) injection 40 mg Start: 06/08/22 2200    Code Status: Full Code Family Communication: No family present at bedside this morning, updated patient's sister via telephone yesterday  Disposition Plan:  Level of care: Med-Surg Status is: Inpatient Remains inpatient appropriate because: Will need SNF placement, anticipate likely need 10-day quarantine period before able for discharge     Consultants:  Palliative care  Procedures:  none  Antimicrobials:  none   Subjective: Patient seen examined bedside, resting comfortably.  Remains pleasantly confused.  No family present at bedside this morning.  No complaints.  Appears comfortable and not acute distress.  Oxygenating well on room air.  No concerns per nursing staff overnight.  Objective: Vitals:   06/10/22 1509 06/10/22 2115 06/11/22 0505 06/11/22 0928  BP: 130/89 (!) 156/103 114/78 120/86  Pulse: 68 68    Resp: '17 20 18   '$ Temp: 98.5 F (36.9 C) 98.6 F (37 C) 98 F (36.7 C)   TempSrc: Oral  Oral   SpO2: 98% 100% 100%   Weight:      Height:        Intake/Output Summary (Last 24  hours) at 06/11/2022 1052 Last data filed at 06/11/2022 0510 Gross per 24 hour  Intake 120 ml  Output 500 ml  Net -380 ml   Filed Weights   06/08/22 2035  Weight: (!) 144.6 kg    Examination:  Physical Exam: GEN: NAD, alert, but not oriented to place (Drs. Office), time (2003). Person (no answer) nor situation, chronically ill in appearance, appears older than stated age HEENT: NCAT, PERRL, EOMI, sclera clear, MMM PULM: CTAB w/o wheezes/crackles, normal respiratory effort, on room air CV: RRR w/o M/G/R, left chest port noted in place GI: abd soft, NTND, NABS, no R/G/M MSK: no peripheral edema, moves all extremities independently NEURO: CN II-XII intact, no focal deficits, sensation to light touch intact PSYCH: Depressed mood, flat affect Integumentary: No concerning rash/lesions/wounds noted on exposed skin surfaces    Data Reviewed: I have personally reviewed following labs and imaging studies  CBC: Recent Labs  Lab 06/08/22 1457 06/09/22 0420 06/11/22 0437  WBC 4.4 2.8* 2.1*  NEUTROABS 3.2  --   --   HGB 11.2* 10.2* 12.9  HCT 36.0 33.0* 41.4  MCV 91.4 90.7 89.8  PLT 213 199 962   Basic Metabolic Panel: Recent Labs  Lab 06/08/22 1457 06/09/22 0420 06/10/22 0125 06/11/22 0437  NA 137 136 136 136  K 3.4* 3.8 3.7 3.4*  CL 103 104 99 100  CO2 '29 28 28 27  '$ GLUCOSE 88 91 98 97  BUN '13 10 8 12  '$ CREATININE  0.79 0.83 0.79 0.91  CALCIUM 8.6* 8.3* 9.3 8.8*  MG  --   --  2.2  --   PHOS  --   --  3.5  --    GFR: Estimated Creatinine Clearance: 103 mL/min (by C-G formula based on SCr of 0.91 mg/dL). Liver Function Tests: Recent Labs  Lab 06/08/22 1457  AST 14*  ALT 11  ALKPHOS 69  BILITOT 1.0  PROT 7.7  ALBUMIN 3.7   No results for input(s): "LIPASE", "AMYLASE" in the last 168 hours. No results for input(s): "AMMONIA" in the last 168 hours. Coagulation Profile: No results for input(s): "INR", "PROTIME" in the last 168 hours. Cardiac Enzymes: No results  for input(s): "CKTOTAL", "CKMB", "CKMBINDEX", "TROPONINI" in the last 168 hours. BNP (last 3 results) No results for input(s): "PROBNP" in the last 8760 hours. HbA1C: No results for input(s): "HGBA1C" in the last 72 hours. CBG: No results for input(s): "GLUCAP" in the last 168 hours. Lipid Profile: No results for input(s): "CHOL", "HDL", "LDLCALC", "TRIG", "CHOLHDL", "LDLDIRECT" in the last 72 hours. Thyroid Function Tests: No results for input(s): "TSH", "T4TOTAL", "FREET4", "T3FREE", "THYROIDAB" in the last 72 hours. Anemia Panel: No results for input(s): "VITAMINB12", "FOLATE", "FERRITIN", "TIBC", "IRON", "RETICCTPCT" in the last 72 hours. Sepsis Labs: Recent Labs  Lab 06/08/22 1458  LATICACIDVEN 0.7    Recent Results (from the past 240 hour(s))  SARS Coronavirus 2 by RT PCR (hospital order, performed in [facility] hospital lab) *cepheid single result test* Anterior Nasal Swab     Status: Abnormal   Collection Time: 06/08/22  3:28 PM   Specimen: Anterior Nasal Swab  Result Value Ref Range Status   SARS Coronavirus 2 by RT PCR POSITIVE (A) NEGATIVE Final    Comment: (NOTE) SARS-CoV-2 target nucleic acids are DETECTED  SARS-CoV-2 RNA is generally detectable in upper respiratory specimens  during the acute phase of infection.  Positive results are indicative  of the presence of the identified virus, but do not rule out bacterial infection or co-infection with other pathogens not detected by the test.  Clinical correlation with patient history and  other diagnostic information is necessary to determine patient infection status.  The expected result is negative.  Fact Sheet for Patients:   https://www.patel.info/   Fact Sheet for Healthcare Providers:   https://hall.com/    This test is not yet approved or cleared by the [place] FDA and  has been authorized for detection and/or diagnosis of SARS-CoV-2 by FDA under an  Emergency Use Authorization (EUA).  This EUA will remain in effect (meaning this test can be used) for the duration of  the COVID-19 declaration under Section 564(b)(1)  of the Act, 21 U.S.C. section 360-bbb-3(b)(1), unless the authorization is terminated or revoked sooner.   Performed at [facility], [place] [address] [place], [place] [address]   Blood culture (routine x 2)     Status: None (Preliminary result)   Collection Time: 06/08/22  4:36 PM   Specimen: BLOOD  Result Value Ref Range Status   Specimen Description   Final    BLOOD PORTA CATH Performed at [place] [address], [place], [place] [address]    Special Requests   Final    BOTTLES DRAWN AEROBIC AND ANAEROBIC Blood Culture adequate volume Performed at [place] [address] [place], [place] [address]    Culture   Final    NO GROWTH 3 DAYS Performed at [facility]  Lab, [address] [place], [place] [address]    Report Status PENDING  Incomplete         Radiology Studies: No results found.      Scheduled Meds:  amLODipine  5 mg Oral Daily   Chlorhexidine Gluconate Cloth  6 each Topical Daily   divalproex  250 mg Oral BID   enoxaparin (LOVENOX) injection  40 mg Subcutaneous Q24H   gabapentin  300 mg Oral BID   lisinopril  10 mg Oral Daily   molnupiravir EUA  4 capsule Oral BID   pantoprazole  40 mg Oral Daily   sodium chloride flush  10-40 mL Intracatheter Q12H   venlafaxine XR  75 mg Oral Q breakfast   Continuous Infusions:     LOS: 2 days    Time spent: 48 minutes spent on chart review, discussion with nursing staff, consultants, updating family and interview/physical exam; more than 50% of that time was spent in counseling and/or coordination of care.    Reyana Leisey J [place] DO [facility] Available via Epic secure chat 7am-7pm After these hours, please refer to coverage provider listed on  [email or web address] 06/11/2022, 10:52 AM

## 2022-06-11 NOTE — TOC Initial Note (Signed)
Transition of Care [facility]) - Initial/Assessment Note    Patient Details  Name: Monica Mccoy MRN: [id number] Date of Birth: Oct 10, 1965  [facility] CM/SW Contact:    Vassie Moselle, LCSW Phone Number: 06/11/2022, 11:50 AM  Clinical Narrative:                 CSW spoke with pt's sister, Benard Rink and confirmed plan for SNF placement. Per sister pt has never been to SNF and she has no preferences for placement. Pt will need to complete COVID isolation prior to transferring to SNF.  Referrals have been made for SNF placement.   Expected Discharge Plan: Skilled Nursing Facility Barriers to Discharge: SNF Covid, Continued Medical Work up   Patient Goals and CMS Choice Patient states their goals for this hospitalization and ongoing recovery are:: Per sister "For pt to go to nursing facility" CMS Medicare.gov Compare Post Acute Care list provided to:: Patient Represenative (must comment) (Sister, Benard Rink) Choice offered to / list presented to : Sibling  Expected Discharge Plan and Services Expected Discharge Plan: [place] In-house Referral: NA Discharge Planning Services: CM Consult Post Acute Care Choice: [place] Living arrangements for the past 2 months: Single Family Home                 DME Arranged: N/A DME Agency: NA                  Prior Living Arrangements/Services Living arrangements for the past 2 months: Single Family Home Lives with:: Self Patient language and need for interpreter reviewed:: Yes        Need for Family Participation in Patient Care: Yes (Comment) Care giver support system in place?: No (comment) Current home services: DME Criminal Activity/Legal Involvement Pertinent to Current Situation/Hospitalization: No - Comment as needed  Activities of Daily Living Home Assistive Devices/Equipment: Eyeglasses, [place] Rile (specify type) ADL Screening (condition at time of admission) Patient's cognitive  ability adequate to safely complete daily activities?: No Is the patient deaf or have difficulty hearing?: Yes Does the patient have difficulty seeing, even when wearing glasses/contacts?: No Does the patient have difficulty concentrating, remembering, or making decisions?: Yes Patient able to express need for assistance with ADLs?: Yes Does the patient have difficulty dressing or bathing?: No Independently performs ADLs?: No Communication: Independent Dressing (OT): Independent Grooming: Independent Feeding: Independent Bathing: Independent Toileting: Needs assistance Is this a change from baseline?: Pre-admission baseline In/Out Bed: Needs assistance Is this a change from baseline?: Change from baseline, expected to last >3 days Walks in Home: Needs assistance Is this a change from baseline?: Pre-admission baseline Does the patient have difficulty walking or climbing stairs?: Yes Weakness of Legs: Both Weakness of Arms/Hands: Both  Permission Sought/Granted Permission sought to share information with : Facility Sport and exercise psychologist, Family Supports Permission granted to share information with : No              Emotional Assessment Appearance:: Other (Comment Required (Did not met in person) Attitude/Demeanor/Rapport: Unable to Assess Affect (typically observed): Unable to Assess Orientation: : Oriented to Self Alcohol / Substance Use: Not Applicable Psych Involvement: No (comment)  Admission diagnosis:  Altered mental status, unspecified altered mental status type [V78.46] Acute metabolic encephalopathy [N62.95] COVID-19 with multiple comorbidities [U07.1] Patient Active Problem List   Diagnosis Date Noted   Palliative care encounter 06/09/2022   Goals of care, counseling/discussion 06/09/2022   Counseling and coordination of care 06/09/2022   Altered mental  status 37/36/6815   Acute metabolic encephalopathy 94/70/7615   COVID-19 virus infection 06/08/2022    Generalized weakness 06/08/2022   Tendinitis of both ankles 04/25/2022   Pedal edema 04/13/2022   Cholelithiasis 10/16/2021   Uterine fibroid 10/16/2021   Aortic atherosclerosis ([place]) 10/16/2021   Chemotherapy-induced neuropathy ([place]) 09/11/2021   Localization-related (focal) (partial) symptomatic epilepsy and epileptic syndromes with complex partial seizures, not intractable, without status epilepticus ([place]) 09/11/2021   Essential hypertension 05/30/2021   Port-A-Cath in place 01/16/2021   Migraine without aura 12/19/2020   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[place]) 09/22/2020   Gastroesophageal reflux disease without esophagitis 06/29/2020   Other chronic pain 06/29/2020   Moderate episode of recurrent major depressive disorder ([place]) 06/29/2020   Urinary incontinence 06/29/2020   PCP:  Haydee Salter, MD Pharmacy:   [id number] [address], [place] [address] Phone: [phone] Fax: [phone]     Social Determinants of Health ([place]) Interventions    Readmission Risk Interventions    06/11/2022   11:48 AM 06/10/2022   10:14 AM  Readmission Risk Prevention Plan  Transportation Screening Complete Complete  PCP or Specialist Appt within 5-7 Days Complete Complete  Home Care Screening Complete Complete  Medication Review (RN CM) Complete Complete

## 2022-06-12 ENCOUNTER — Ambulatory Visit: Payer: Medicare Other

## 2022-06-12 ENCOUNTER — Inpatient Hospital Stay (HOSPITAL_COMMUNITY): Payer: Medicare Other

## 2022-06-12 ENCOUNTER — Other Ambulatory Visit: Payer: Medicare Other

## 2022-06-12 ENCOUNTER — Ambulatory Visit: Payer: Medicare Other | Admitting: Hematology and Oncology

## 2022-06-12 DIAGNOSIS — U071 COVID-19: Secondary | ICD-10-CM | POA: Diagnosis not present

## 2022-06-12 LAB — CBC
HCT: 37.5 % (ref 36.0–46.0)
Hemoglobin: 12 g/dL (ref 12.0–15.0)
MCH: 28.6 pg (ref 26.0–34.0)
MCHC: 32 g/dL (ref 30.0–36.0)
MCV: 89.5 fL (ref 80.0–100.0)
Platelets: 193 10*3/uL (ref 150–400)
RBC: 4.19 MIL/uL (ref 3.87–5.11)
RDW: 14.1 % (ref 11.5–15.5)
WBC: 2.4 10*3/uL — ABNORMAL LOW (ref 4.0–10.5)
nRBC: 0 % (ref 0.0–0.2)

## 2022-06-12 LAB — BASIC METABOLIC PANEL
Anion gap: 7 (ref 5–15)
BUN: 22 mg/dL — ABNORMAL HIGH (ref 6–20)
CO2: 27 mmol/L (ref 22–32)
Calcium: 8.8 mg/dL — ABNORMAL LOW (ref 8.9–10.3)
Chloride: 103 mmol/L (ref 98–111)
Creatinine, Ser: 1.11 mg/dL — ABNORMAL HIGH (ref 0.44–1.00)
GFR, Estimated: 59 mL/min — ABNORMAL LOW (ref >60)
Glucose, Bld: 97 mg/dL (ref 70–99)
Potassium: 3.8 mmol/L (ref 3.5–5.1)
Sodium: 137 mmol/L (ref 135–145)

## 2022-06-12 LAB — MAGNESIUM: Magnesium: 2.3 mg/dL (ref 1.7–2.4)

## 2022-06-12 MED ORDER — GADOBUTROL 1 MMOL/ML IV SOLN
10.0000 mL | Freq: Once | INTRAVENOUS | Status: AC | PRN
  Administered 2022-06-12: 10 mL via INTRAVENOUS

## 2022-06-12 MED ORDER — LORAZEPAM 2 MG/ML IJ SOLN
0.5000 mg | Freq: Once | INTRAMUSCULAR | Status: AC
  Administered 2022-06-12: 0.5 mg via INTRAVENOUS
  Filled 2022-06-12: qty 1

## 2022-06-12 NOTE — Care Management Important Message (Signed)
Important Message  Patient Details IM Letter given Name: Monica Mccoy MRN: [id number] Date of Birth: February 02, 1966   Medicare Important Message Given:  Yes     Kerin Salen 06/12/2022, 10:54 AM

## 2022-06-12 NOTE — TOC Progression Note (Signed)
Transition of Care [facility]) - Progression Note    Patient Details  Name: Monica Mccoy MRN: [id number] Date of Birth: 02-19-1966  Transition of Care [facility]) CM/SW [place], [place] Phone Number: 06/12/2022, 11:07 AM  Clinical Narrative:    CSW spoke with pt's sisters over the phone to review bed offers. Pt's sisters plan to visit facilities prior to making placement decision. Pt will complete her COVID isolation period on 06/18/22 and will be able to transfer to SNF after that time.    Expected Discharge Plan: [place] Barriers to Discharge: SNF Covid, Continued Medical Work up  Expected Discharge Plan and Services Expected Discharge Plan: [place] In-house Referral: NA Discharge Planning Services: CM Consult Post Acute Care Choice: [place] Living arrangements for the past 2 months: Single Family Home                 DME Arranged: N/A DME Agency: NA                   Social Determinants of Health (SDOH) Interventions    Readmission Risk Interventions    06/11/2022   11:48 AM 06/10/2022   10:14 AM  Readmission Risk Prevention Plan  Transportation Screening Complete Complete  PCP or Specialist Appt within 5-7 Days Complete Complete  Home Care Screening Complete Complete  Medication Review (RN CM) Complete Complete

## 2022-06-12 NOTE — Progress Notes (Signed)
Physical Therapy Treatment Patient Details Name: Monica Mccoy MRN: [id number] DOB: 11/24/1965 Today's Date: 06/12/2022   History of Present Illness Monica Mccoy is a 56 y.o. 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 She was found to have COVID-19.    PT Comments    Pt agreeable to therapy, comes to EOB with increased time and therapist managing lines for safety. Pt with strong posterior lean with transfers, cued for flat foot posture, anterior weight shift, pushing from seated surface and returning hands to seat to assist in controlled return to sitting. Pt able to take steps in room with RW, posterior lean improving with multimodal cues, min A to steady. Pt able to perform therapeutic exercises with cues for motor control and increased AROM. Pt needing increased time and cues with commands, occasional random speech and pt talking to tv, easily distracted but able to redirect to task. Pt on RA with SpO2 >92% throughout session.    Recommendations for follow up therapy are one component of a multi-disciplinary discharge planning process, led by the attending physician.  Recommendations may be updated based on patient status, additional functional criteria and insurance authorization.  Follow Up Recommendations  Skilled nursing-short term rehab (<3 hours/day) Can patient physically be transported by private vehicle: Yes   Assistance Recommended at Discharge Frequent or constant Supervision/Assistance  Patient can return home with the following A lot of help with walking and/or transfers;A lot of help with bathing/dressing/bathroom;Assistance with cooking/housework   Equipment Recommendations  None recommended by PT    Recommendations for Other  Services       Precautions / Restrictions Precautions Precautions: Fall Restrictions Weight Bearing Restrictions: No     Mobility  Bed Mobility Overal bed mobility: Needs Assistance Bed Mobility: Supine to Sit  Supine to sit: Min guard  General bed mobility comments: increased time and effort, therapist managing lines for safety    Transfers Overall transfer level: Needs assistance Equipment used: Rolling walker (2 wheels) Transfers: Sit to/from Stand Sit to Stand: Min assist  General transfer comment: strong posterior lean, BLE bracing against bed, toes lifts from floor, min A to steady and cue pt forward; cues for returning hands to surface prior to sitting, uncontrolled descent without hands assisting; x2 reps from elevated bed and x5 reps from recliner    Ambulation/Gait Ambulation/Gait assistance: Min guard Gait Distance (Feet): 10 Feet Assistive device: Rolling walker (2 wheels) Gait Pattern/deviations: Step-through pattern, Decreased stride length, Narrow base of support Gait velocity: decreased  General Gait Details: slow, short steps in room with narrow BOS, decreased stride and step length, posterior lean improves with multimodal cues   Stairs             Wheelchair Mobility    Modified Rankin (Stroke Patients Only)       Balance Overall balance assessment: History of Falls, Needs assistance Sitting-balance support: Feet supported Sitting balance-Leahy Scale: Fair  Standing balance support: During functional activity, Bilateral upper extremity supported, Reliant on assistive device for balance Standing balance-Leahy Scale: Poor     Cognition Arousal/Alertness: Awake/alert Behavior During Therapy: WFL for tasks assessed/performed Overall Cognitive Status: No family/caregiver present to determine baseline cognitive functioning  Following Commands: Follows one step commands with increased time  General Comments: pt pleasant, follows commands with  cues and increased time,  speech random at times and talking to TV, easily distracted        Exercises General Exercises - Lower Extremity Long Arc Quad: Seated, AROM, Strengthening, Both, 10 reps Straight Leg Raises: Seated, AROM, Strengthening, Both, 10 reps Hip Flexion/Marching: Seated, AROM, Strengthening, Both, 10 reps    General Comments        Pertinent Vitals/Pain Pain Assessment Pain Assessment: No/denies pain    Home Living                          Prior Function            PT Goals (current goals can now be found in the care plan section) Acute Rehab PT Goals Patient Stated Goal: none stated PT Goal Formulation: Patient unable to participate in goal setting Time For Goal Achievement: 06/23/22 Potential to Achieve Goals: Fair Progress towards PT goals: Progressing toward goals    Frequency    Min 2X/week      PT Plan Current plan remains appropriate    Co-evaluation              AM-PAC PT "6 Clicks" Mobility   Outcome Measure  Help needed turning from your back to your side while in a flat bed without using bedrails?: A Little Help needed moving from lying on your back to sitting on the side of a flat bed without using bedrails?: A Little Help needed moving to and from a bed to a chair (including a wheelchair)?: A Little Help needed standing up from a chair using your arms (e.g., wheelchair or bedside chair)?: A Little Help needed to walk in hospital room?: A Lot Help needed climbing 3-5 steps with a railing? : A Lot 6 Click Score: 16    End of Session Equipment Utilized During Treatment: Gait belt Activity Tolerance: Patient tolerated treatment well Patient left: in chair;with call bell/phone within reach;with chair alarm set Nurse Communication: Mobility status PT Visit Diagnosis: Unsteadiness on feet (R26.81);Muscle weakness (generalized) (M62.81);Difficulty in walking, not elsewhere classified (R26.2)     Time:  2542-7062 PT Time Calculation (min) (ACUTE ONLY): 30 min  Charges:  $Therapeutic Exercise: 8-22 mins $Therapeutic Activity: 8-22 mins                      Tori Claritza July PT, DPT 06/12/22, 12:25 PM

## 2022-06-12 NOTE — Progress Notes (Signed)
    OVERNIGHT PROGRESS REPORT  Notified by Imaging and RN for inability to access original imaging order and premed  Reordered premed and imaging from attending notation/order list.   Gershon Cull MSNA MSN ACNPC-AG Acute Care Nurse Practitioner [facility]

## 2022-06-12 NOTE — Progress Notes (Signed)
PROGRESS NOTE    Monica Mccoy  ENI:[id number] DOB: 1965/11/04 DOA: 06/08/2022 PCP: Haydee Salter, MD    Brief Narrative:   Monica Mccoy is a 56 y.o. 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  History is limited from patient due to altered mental status and otherwise supplemented by EDP, chart review, and sister Benard Rink by phone.   Patient has had apparent increased lethargy and decreased level of interaction over the last 1.5 weeks.  She normally ambulates with the use of a walker.  She has some mild confusion at baseline but can usually participate in meaningful conversation.  She has been having progressive weakness and apparent unstable gait when trying to use her walker.  Today she was very lethargic and when trying to stand up out of her chair she fell.  She fell again later in the day in the bathroom.  She did not have any apparent significant injury.  EMS were called and she was brought to the ED for further evaluation.   Patient is oriented to self only at time of admission.  She reports chronic chest pain and lower extremity swelling which is not significantly changed from baseline.   In the ED, BP 182/120, pulse 79, RR 20, temp 98.2 F, SPO2 97% on room air. WBC 4.4, hemoglobin 11.2, platelets 213,000, sodium 137, potassium 3.4, bicarb 29, BUN 13, creatinine 0.79, serum glucose 88, lactic acid 0.7, valproate level 31. SARS-CoV-2 PCR is positive.  Urinalysis negative for UTI. Portable chest x-ray negative for focal consolidation, edema, effusion.  Left-sided central venous port tip seen over the SVC. CT head without contrast negative for acute intracranial pathology or evidence of metastatic disease. CT cervical spine without contrast  negative for evidence of acute traumatic injury.  Unchanged compression deformity of the C6 vertebral body with up to approximately 50% loss of vertebral body height noted. CT abdomen/pelvis with contrast negative for acute findings. Unchanged fibroid uterus, tiny gallstones without evidence of acute cholecystitis reported. The hospitalist service was consulted to admit for further evaluation and management.  Assessment & Plan:   COVID-19 virus infection Likely cause of acute metabolic encephalopathy.  She is saturating well on room air and CXR without evidence of pneumonia or infiltrate. --Molnupiravir '800mg'$  PO BID x 5 days --Steroids not indicated at this time as patient is acting well on room air, no acute pulmonary disease process noted on chest x-ray. --Continue airborne/contact isolation for 10 days from date of initial diagnosis   Acute metabolic encephalopathy Increased confusion from baseline in setting of COVID-19 viral infection.  Some confusion at baseline but normally can participate in meaningful conversation however unable to do so on admission.  Oriented to self only.  Likely some component of medication effect as well. --On molnupiravir as above --Holding tramadol  --restart gabapentin at lower dose --Continues with confusion, MRI brain with and without contrast pending to ensure no new metastatic lesions present   Generalized weakness Normally ambulates with a walker.  Worsening generalized weakness in setting of COVID-19 infection.  Several falls at home without significant injury.  CT head and cervical spine reassuring. --Fall precautions --PT/OT recommending SNF placement, TOC for evaluation  Hypokalemia: Resolved Potassium 3.8 this morning --Repeat electrolytes in the a.m.   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   Valproate level slightly low at 31. --Continue Depakote 250 mg twice daily   Cancer of right breast metastatic to brain [facility]) Follows with oncology, Dr. Lindi Adie.  S/p b/l mastectomy 07/2012 followed by chemotherapy and radiation.  Found to have 2 frontal lobe tumors 11/2013 and underwent resection followed with whole brain radiation and chemotherapy.  Recent MRI brain with and without contrast 01/29/2022 with no evidence of intracranial metastatic disease or other acute abnormality with noted chronic findings.  Currently on Herceptin maintenance therapy. --Palliative care consulted for goals of care and medical decision making   Chemotherapy-induced neuropathy (HCC) Holding tramadol  --restart gabapentin at '300mg'$  PO BID   Essential hypertension Continue lisinopril 10 mg daily, amlodipine 5 mg p.o. daily.   DVT prophylaxis:   Lovenox    Code Status: Full Code Family Communication: No family present at bedside this morning  Disposition Plan:  Level of care: Med-Surg Status is: Inpatient Remains inpatient appropriate because: Pending SNF placement, will need 10-day quarantine prior to discharge, per [facility]     Consultants:  Palliative care  Procedures:  none  Antimicrobials:  none   Subjective: Patient seen examined bedside, resting comfortably.  Remains pleasantly confused.  No family present at bedside this morning.  No complaints.  Appears comfortable and not acute distress.  Oxygenating well on room air.  No concerns per nursing staff overnight.  Pending MRI brain and SNF placement.  Objective: Vitals:   06/11/22 0928 06/11/22 1452 06/11/22 2032 06/12/22 0446  BP: 120/86 129/87 100/88 121/88  Pulse:  89 80 72  Resp:  '16 20 16  '$ Temp:  98.4 F (36.9 C) 98.4 F (36.9 C) 98.3 F (36.8 C)  TempSrc:  Oral Oral Oral  SpO2:  100% 99% 94%  Weight:      Height:        Intake/Output Summary (Last 24 hours) at 06/12/2022 1140 Last data filed at 06/12/2022 0800 Gross per 24  hour  Intake 709 ml  Output 1400 ml  Net -691 ml   Filed Weights   06/08/22 2035  Weight: (!) 144.6 kg    Examination:  Physical Exam: GEN: NAD, alert and oriented to time (2023) but not oriented to place (no answer), , person (2023) nor situation, chronically ill in appearance, appears older than stated age HEENT: NCAT, PERRL, EOMI, sclera clear, MMM PULM: CTAB w/o wheezes/crackles, normal respiratory effort, on room air CV: RRR w/o M/G/R, left chest port noted in place GI: abd soft, NTND, NABS, no R/G/M MSK: no peripheral edema, moves all extremities independently NEURO: CN II-XII intact, no focal deficits, sensation to light touch intact PSYCH: Depressed mood, flat affect Integumentary: No concerning rash/lesions/wounds noted on exposed skin surfaces    Data Reviewed: I have personally reviewed following labs and imaging studies  CBC: Recent Labs  Lab 06/08/22 1457 06/09/22 0420 06/11/22 0437 06/12/22 0544  WBC 4.4 2.8* 2.1* 2.4*  NEUTROABS 3.2  --   --   --   HGB 11.2* 10.2* 12.9 12.0  HCT 36.0 33.0* 41.4 37.5  MCV 91.4 90.7 89.8 89.5  PLT 213 199 199 010   Basic Metabolic Panel: Recent Labs  Lab 06/08/22 1457 06/09/22 0420 06/10/22 0125 06/11/22 0437 06/12/22 0544  NA 137 136 136 136 137  K 3.4* 3.8 3.7 3.4* 3.8  CL 103 104 99 100 103  CO2 '29 28 28 27 27  '$ GLUCOSE 88  91 98 97 97  BUN '13 10 8 12 '$ 22*  CREATININE 0.79 0.83 0.79 0.91 1.11*  CALCIUM 8.6* 8.3* 9.3 8.8* 8.8*  MG  --   --  2.2  --  2.3  PHOS  --   --  3.5  --   --    GFR: Estimated Creatinine Clearance: 84.4 mL/min (A) (by C-G formula based on SCr of 1.11 mg/dL (H)). Liver Function Tests: Recent Labs  Lab 06/08/22 1457  AST 14*  ALT 11  ALKPHOS 69  BILITOT 1.0  PROT 7.7  ALBUMIN 3.7   No results for input(s): "LIPASE", "AMYLASE" in the last 168 hours. No results for input(s): "AMMONIA" in the last 168 hours. Coagulation Profile: No results for input(s): "INR", "PROTIME" in the  last 168 hours. Cardiac Enzymes: No results for input(s): "CKTOTAL", "CKMB", "CKMBINDEX", "TROPONINI" in the last 168 hours. BNP (last 3 results) No results for input(s): "PROBNP" in the last 8760 hours. HbA1C: No results for input(s): "HGBA1C" in the last 72 hours. CBG: No results for input(s): "GLUCAP" in the last 168 hours. Lipid Profile: No results for input(s): "CHOL", "HDL", "LDLCALC", "TRIG", "CHOLHDL", "LDLDIRECT" in the last 72 hours. Thyroid Function Tests: No results for input(s): "TSH", "T4TOTAL", "FREET4", "T3FREE", "THYROIDAB" in the last 72 hours. Anemia Panel: No results for input(s): "VITAMINB12", "FOLATE", "FERRITIN", "TIBC", "IRON", "RETICCTPCT" in the last 72 hours. Sepsis Labs: Recent Labs  Lab 06/08/22 1458  LATICACIDVEN 0.7    Recent Results (from the past 240 hour(s))  SARS Coronavirus 2 by RT PCR (hospital order, performed in [facility] hospital lab) *cepheid single result test* Anterior Nasal Swab     Status: Abnormal   Collection Time: 06/08/22  3:28 PM   Specimen: Anterior Nasal Swab  Result Value Ref Range Status   SARS Coronavirus 2 by RT PCR POSITIVE (A) NEGATIVE Final    Comment: (NOTE) SARS-CoV-2 target nucleic acids are DETECTED  SARS-CoV-2 RNA is generally detectable in upper respiratory specimens  during the acute phase of infection.  Positive results are indicative  of the presence of the identified virus, but do not rule out bacterial infection or co-infection with other pathogens not detected by the test.  Clinical correlation with patient history and  other diagnostic information is necessary to determine patient infection status.  The expected result is negative.  Fact Sheet for Patients:   https://www.patel.info/   Fact Sheet for Healthcare Providers:   https://hall.com/    This test is not yet approved or cleared by the [place] FDA and  has been authorized for detection and/or  diagnosis of SARS-CoV-2 by FDA under an Emergency Use Authorization (EUA).  This EUA will remain in effect (meaning this test can be used) for the duration of  the COVID-19 declaration under Section 564(b)(1)  of the Act, 21 U.S.C. section 360-bbb-3(b)(1), unless the authorization is terminated or revoked sooner.   Performed at [facility], [place] [address], [place], [place] [address]   Blood culture (routine x 2)     Status: None (Preliminary result)   Collection Time: 06/08/22  4:36 PM   Specimen: BLOOD  Result Value Ref Range Status   Specimen Description   Final    BLOOD PORTA CATH Performed at [place] [address], [place], [place] [address]    Special Requests   Final    BOTTLES DRAWN AEROBIC AND ANAEROBIC Blood Culture adequate volume Performed at [place] [address] [place],  [place] [address]    Culture   Final    NO GROWTH 4 DAYS Performed at [facility], [place] [address], [place], [place] [address]    Report Status PENDING  Incomplete         Radiology Studies: No results found.      Scheduled Meds:  amLODipine  5 mg Oral Daily   Chlorhexidine Gluconate Cloth  6 each Topical Daily   divalproex  250 mg Oral BID   enoxaparin (LOVENOX) injection  70 mg Subcutaneous Q24H   gabapentin  300 mg Oral BID   lisinopril  10 mg Oral Daily   LORazepam  1 mg Intravenous On Call   molnupiravir EUA  4 capsule Oral BID   pantoprazole  40 mg Oral Daily   sodium chloride flush  10-40 mL Intracatheter Q12H   venlafaxine XR  75 mg Oral Q breakfast   Continuous Infusions:     LOS: 3 days    Time spent: 48 minutes spent on chart review, discussion with nursing staff, consultants, updating family and interview/physical exam; more than 50% of that time was spent in counseling and/or coordination of care.    Rodricus Candelaria J [place] DO [facility] Available via Epic secure chat  7am-7pm After these hours, please refer to coverage provider listed on [email or web address] 06/12/2022, 11:40 AM

## 2022-06-13 DIAGNOSIS — T451X5A Adverse effect of antineoplastic and immunosuppressive drugs, initial encounter: Secondary | ICD-10-CM

## 2022-06-13 DIAGNOSIS — G9341 Metabolic encephalopathy: Secondary | ICD-10-CM | POA: Diagnosis not present

## 2022-06-13 DIAGNOSIS — R531 Weakness: Secondary | ICD-10-CM

## 2022-06-13 DIAGNOSIS — C50911 Malignant neoplasm of unspecified site of right female breast: Secondary | ICD-10-CM | POA: Diagnosis not present

## 2022-06-13 DIAGNOSIS — C7931 Secondary malignant neoplasm of brain: Secondary | ICD-10-CM

## 2022-06-13 DIAGNOSIS — U071 COVID-19: Secondary | ICD-10-CM | POA: Diagnosis not present

## 2022-06-13 DIAGNOSIS — G40209 Localization-related (focal) (partial) symptomatic epilepsy and epileptic syndromes with complex partial seizures, not intractable, without status epilepticus: Secondary | ICD-10-CM

## 2022-06-13 DIAGNOSIS — G620 Drug-induced polyneuropathy: Secondary | ICD-10-CM | POA: Diagnosis not present

## 2022-06-13 LAB — CBC
HCT: 38.7 % (ref 36.0–46.0)
Hemoglobin: 12 g/dL (ref 12.0–15.0)
MCH: 28 pg (ref 26.0–34.0)
MCHC: 31 g/dL (ref 30.0–36.0)
MCV: 90.4 fL (ref 80.0–100.0)
Platelets: 206 10*3/uL (ref 150–400)
RBC: 4.28 MIL/uL (ref 3.87–5.11)
RDW: 13.9 % (ref 11.5–15.5)
WBC: 2.6 10*3/uL — ABNORMAL LOW (ref 4.0–10.5)
nRBC: 0 % (ref 0.0–0.2)

## 2022-06-13 LAB — BASIC METABOLIC PANEL
Anion gap: 7 (ref 5–15)
BUN: 21 mg/dL — ABNORMAL HIGH (ref 6–20)
CO2: 29 mmol/L (ref 22–32)
Calcium: 8.9 mg/dL (ref 8.9–10.3)
Chloride: 104 mmol/L (ref 98–111)
Creatinine, Ser: 0.89 mg/dL (ref 0.44–1.00)
GFR, Estimated: >60 mL/min (ref >60)
Glucose, Bld: 92 mg/dL (ref 70–99)
Potassium: 3.6 mmol/L (ref 3.5–5.1)
Sodium: 140 mmol/L (ref 135–145)

## 2022-06-13 LAB — CULTURE, BLOOD (ROUTINE X 2)
Culture: NO GROWTH
Special Requests: ADEQUATE

## 2022-06-13 LAB — MAGNESIUM: Magnesium: 2.2 mg/dL (ref 1.7–2.4)

## 2022-06-13 NOTE — Progress Notes (Signed)
Occupational Therapy Treatment Patient Details Name: Monica Mccoy MRN: [id number] DOB: Jan 01, 1966 Today's Date: 06/13/2022   History of present illness Monica Mccoy is a 56 y.o. 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 She was found to have COVID-19.   OT comments  Patient was found seated in the bedside chair & she was motivated to participate in the session. She required min assist to stand using a RW, max assist for lower body dressing/sock management, and min assist for a toilet transfer. She was noted to be with unsteadiness in standing, and she required cues to keep the walker closer to her body when ambulating. She also required general cues for safe functional transfer technique(s), including to reach back for transfer surfaces prior to sitting and to control her descent. She will continue to benefit from further OT services to maximize her safety and independence with ADLs.    Recommendations for follow up therapy are one component of a multi-disciplinary discharge planning process, led by the attending physician.  Recommendations may be updated based on patient status, additional functional criteria and insurance authorization.    Follow Up Recommendations  Skilled nursing-short term rehab (<3 hours/day)    Assistance Recommended at Discharge Frequent or constant Supervision/Assistance  Patient can return home with the following  A little help with walking and/or transfers;Assistance with cooking/housework;Direct supervision/assist for financial management;Assist for transportation;Help with stairs or ramp for entrance;Direct supervision/assist for medications management;A little help with bathing/dressing/bathroom         Precautions  / Restrictions Precautions Precautions: Fall Restrictions Weight Bearing Restrictions: No Other Position/Activity Restrictions: contact/airborne       Mobility Bed Mobility Overal bed mobility: Needs Assistance Bed Mobility: Sit to Supine       Sit to supine: Min assist        Transfers Overall transfer level: Needs assistance Equipment used: Rolling walker (2 wheels) Transfers: Sit to/from Stand Sit to Stand: Min assist           General transfer comment: required cues for reaching back for surface prior to sitting and for controlled descent         ADL either performed or assessed with clinical judgement   ADL   Eating/Feeding: Set up Eating/Feeding Details (indicate cue type and reason): based on clinical judgement Grooming: Set up;Sitting           Upper Body Dressing : Minimal assistance;Sitting Upper Body Dressing Details (indicate cue type and reason): She  doffed a hospital gown, then required min assist to don another clean one     Toilet Transfer: Minimal assistance;Ambulation;Rolling walker (2 wheels) Toilet Transfer Details (indicate cue type and reason): Required cues to keep walker closer to her                           Cognition Arousal/Alertness: Awake/alert   Overall Cognitive Status: No family/caregiver present to determine baseline cognitive functioning Area of Impairment: Problem solving        Following Commands: Follows one step commands with increased time Safety/Judgement: Decreased awareness of safety     General Comments: pt pleasant, follows commands with cues and increased time, speech tangential at times  Pertinent Vitals/ Pain       Pain Assessment Pain Assessment: 0-10 Pain Score: 2  Pain Location: abdominal pain Pain Intervention(s): Limited activity within patient's tolerance, Repositioned         Frequency  Min 2X/week        Progress Toward Goals  OT  Goals(current goals can now be found in the care plan section)  Progress towards OT goals: Progressing toward goals  Acute Rehab OT Goals Patient Stated Goal: to lay back down in the bed OT Goal Formulation: With patient Time For Goal Achievement: 06/24/22 Potential to Achieve Goals: [place] Discharge plan remains appropriate       AM-PAC OT "6 Clicks" Daily Activity     Outcome Measure   Help from another person eating meals?: None Help from another person taking care of personal grooming?: A Little Help from another person toileting, which includes using toliet, bedpan, or urinal?: A Lot Help from another person bathing (including washing, rinsing, drying)?: A Lot Help from another person to put on and taking off regular upper body clothing?: A Little Help from another person to put on and taking off regular lower body clothing?: A Lot 6 Click Score: 16    End of Session Equipment Utilized During Treatment: Rolling walker (2 wheels)  OT Visit Diagnosis: Unsteadiness on feet (R26.81);Other abnormalities of gait and mobility (R26.89);Muscle weakness (generalized) (M62.81)   Activity Tolerance Patient tolerated treatment well   Patient Left in bed;with call bell/phone within reach;with bed alarm set   Nurse Communication Mobility status        Time: 1610-9604 OT Time Calculation (min): 32 min  Charges: OT General Charges $OT Visit: 1 Visit OT Treatments $Self Care/Home Management : 8-22 mins $Therapeutic Activity: 8-22 mins     Leota Sauers, OTR/L 06/13/2022, 12:09 PM

## 2022-06-13 NOTE — Progress Notes (Signed)
PROGRESS NOTE    Monica Mccoy  VQQ:[id number] DOB: 03/17/1966 DOA: 06/08/2022 PCP: Haydee Salter, MD    Brief Narrative:   Monica Mccoy is a 56 y.o. female with past medical history of right breast cancer status post brain mets in the past, history of bilateral mastectomy and chemotherapy with whole brain radiation currently on Herceptin,  seizure disorder, HTN, chronic pedal edema, depression presented to hospital with altered mental status with increased increased lethargy and decreased level of interaction over the last 1.5 weeks.  At baseline patient usually ambulates with the use of a walker.  She has some mild confusion at baseline but can usually participate in meaningful conversation.  In the ED, patient had elevated blood pressure.Labs showed a potassium of 3.4.  Valproic acid level was 31.  SARS-CoV-2 PCR was positive.  Urinalysis negative for UTI. Portable chest x-ray negative for infiltrate.  CT head and CT cervical spine was negative for acute findings. Unchanged compression deformity of the C6 vertebral body with up to approximately 50% loss of vertebral body height noted. CT abdomen/pelvis with contrast negative for acute findings but unchanged fibroids and tiny gallstones.  Patient was then considered for admission to hospital for altered mental status.    Assessment & Plan:  Principal Problem:   COVID-19 virus infection Active Problems:   Acute metabolic encephalopathy   Localization-related (focal) (partial) symptomatic epilepsy and epileptic syndromes with complex partial seizures, not intractable, without status epilepticus ([place])   Generalized weakness   Cancer of right breast metastatic to brain [facility])   Essential hypertension   Chemotherapy-induced neuropathy ([place])   Palliative care encounter   Goals of care, counseling/discussion   Counseling and coordination of care   Altered mental status   COVID-19 virus infection Chest x-ray without infiltrate.  Not hypoxic.   Continue Molnupiravir '800mg'$  PO BID x 5 days.  Continue isolation precautions for 10 days from the date of initial diagnosis blood cultures negative in 5 days.   Acute metabolic encephalopathy Improved at this time.  Patient had increased confusion from baseline secondary to COVID infection.  MRI of the brain without new findings.  Tramadol on hold.  Gabapentin started at a lower dose.  Continue treatment for COVID.     Hypokalemia: Improved  Localization-related (focal) (partial) symptomatic epilepsy and epileptic syndromes with complex partial seizures, not intractable, without status epilepticus ([place]) No further seizure episodes.  Continue Depakote 250 mg twice daily   Cancer of right breast metastatic to brain [facility]) Follows with oncology, Dr. Lindi Adie.  Status post bilateral mastectomy 07/2012 followed by chemotherapy and radiation.  Found to have 2 frontal lobe tumors 11/2013 and underwent resection followed with whole brain radiation and chemotherapy.  Currently on Herceptin continue therapy.  MRI of the brain on 04/12/2022 showed a stable brain MRI with no evidence for residual or recurrent intracranial metastatic disease.  Palliative care on board   Chemotherapy-induced neuropathy ([place]) Has been started on gabapentin at '300mg'$  PO BID.  Tramadol on hold   Essential hypertension Continue lisinopril, amlodipine  Generalized weakness debility, deconditioning Worsening generalized weakness.  At baseline uses walker.  PT OT recommending skilled nursing facility placement at this time.  History of several falls  DVT prophylaxis:   Lovenox    Code Status: Full Code  Family Communication:  None at bedside  Disposition Plan: Skilled nursing facility.  Status is: Inpatient  Remains inpatient appropriate because: Pending SNF placement, will need 10-day quarantine prior to discharge per TOC.  Medically stable  for disposition    Consultants:  Palliative care  Procedures:   none  Antimicrobials:  none   Subjective: Today, patient was seen and examined at bedside.  Patient denies any dizziness, lightheadedness headache nausea or vomiting.  Has mild cough.  Has been having bowel movements.  Objective: Vitals:   06/12/22 0446 06/12/22 1259 06/12/22 2055 06/13/22 0521  BP: 121/88 107/74 108/64 119/78  Pulse: 72 83 82 (!) 56  Resp: '16 18 16 16  '$ Temp: 98.3 F (36.8 C) 98 F (36.7 C) 98.4 F (36.9 C) 97.6 F (36.4 C)  TempSrc: Oral Oral Oral   SpO2: 94% 99% 98% 99%  Weight:      Height:        Intake/Output Summary (Last 24 hours) at 06/13/2022 0804 Last data filed at 06/12/2022 1735 Gross per 24 hour  Intake --  Output 300 ml  Net -300 ml    Filed Weights   06/08/22 2035  Weight: (!) 144.6 kg    Examination:  General:  Average built, not in obvious distress, chronically ill, appears older than stated age. HENT:   No scleral pallor or icterus noted. Oral mucosa is moist.  Chest:  Clear breath sounds.  Diminished breath sounds bilaterally. No crackles or wheezes.  Left chest wall port in place CVS: S1 &S2 heard. No murmur.  Regular rate and rhythm. Abdomen: Soft, nontender, nondistended.  Bowel sounds are heard.   Extremities: No cyanosis, clubbing or edema.  Peripheral pulses are palpable. Psych: Alert, awake, flat affect, Communicative, oriented to place and time CNS:  No cranial nerve deficits.  Power equal in all extremities.   Skin: Warm and dry.  No rashes noted.   Data Reviewed: I have personally reviewed following labs and imaging studies  CBC: Recent Labs  Lab 06/08/22 1457 06/09/22 0420 06/11/22 0437 06/12/22 0544 06/13/22 0557  WBC 4.4 2.8* 2.1* 2.4* 2.6*  NEUTROABS 3.2  --   --   --   --   HGB 11.2* 10.2* 12.9 12.0 12.0  HCT 36.0 33.0* 41.4 37.5 38.7  MCV 91.4 90.7 89.8 89.5 90.4  PLT 213 199 199 193 275    Basic Metabolic Panel: Recent Labs  Lab 06/09/22 0420 06/10/22 0125 06/11/22 0437 06/12/22 0544  06/13/22 0557  NA 136 136 136 137 140  K 3.8 3.7 3.4* 3.8 3.6  CL 104 99 100 103 104  CO2 '28 28 27 27 29  '$ GLUCOSE 91 98 97 97 92  BUN '10 8 12 '$ 22* 21*  CREATININE 0.83 0.79 0.91 1.11* 0.89  CALCIUM 8.3* 9.3 8.8* 8.8* 8.9  MG  --  2.2  --  2.3 2.2  PHOS  --  3.5  --   --   --     GFR: Estimated Creatinine Clearance: 105.3 mL/min (by C-G formula based on SCr of 0.89 mg/dL). Liver Function Tests: Recent Labs  Lab 06/08/22 1457  AST 14*  ALT 11  ALKPHOS 69  BILITOT 1.0  PROT 7.7  ALBUMIN 3.7    No results for input(s): "LIPASE", "AMYLASE" in the last 168 hours. No results for input(s): "AMMONIA" in the last 168 hours. Coagulation Profile: No results for input(s): "INR", "PROTIME" in the last 168 hours. Cardiac Enzymes: No results for input(s): "CKTOTAL", "CKMB", "CKMBINDEX", "TROPONINI" in the last 168 hours. BNP (last 3 results) No results for input(s): "PROBNP" in the last 8760 hours. HbA1C: No results for input(s): "HGBA1C" in the last 72 hours. CBG: No results for input(s): "GLUCAP"  in the last 168 hours. Lipid Profile: No results for input(s): "CHOL", "HDL", "LDLCALC", "TRIG", "CHOLHDL", "LDLDIRECT" in the last 72 hours. Thyroid Function Tests: No results for input(s): "TSH", "T4TOTAL", "FREET4", "T3FREE", "THYROIDAB" in the last 72 hours. Anemia Panel: No results for input(s): "VITAMINB12", "FOLATE", "FERRITIN", "TIBC", "IRON", "RETICCTPCT" in the last 72 hours. Sepsis Labs: Recent Labs  Lab 06/08/22 1458  LATICACIDVEN 0.7     Recent Results (from the past 240 hour(s))  SARS Coronavirus 2 by RT PCR (hospital order, performed in [facility] hospital lab) *cepheid single result test* Anterior Nasal Swab     Status: Abnormal   Collection Time: 06/08/22  3:28 PM   Specimen: Anterior Nasal Swab  Result Value Ref Range Status   SARS Coronavirus 2 by RT PCR POSITIVE (A) NEGATIVE Final    Comment: (NOTE) SARS-CoV-2 target nucleic acids are DETECTED  SARS-CoV-2  RNA is generally detectable in upper respiratory specimens  during the acute phase of infection.  Positive results are indicative  of the presence of the identified virus, but do not rule out bacterial infection or co-infection with other pathogens not detected by the test.  Clinical correlation with patient history and  other diagnostic information is necessary to determine patient infection status.  The expected result is negative.  Fact Sheet for Patients:   https://www.patel.info/   Fact Sheet for Healthcare Providers:   https://hall.com/    This test is not yet approved or cleared by the [place] FDA and  has been authorized for detection and/or diagnosis of SARS-CoV-2 by FDA under an Emergency Use Authorization (EUA).  This EUA will remain in effect (meaning this test can be used) for the duration of  the COVID-19 declaration under Section 564(b)(1)  of the Act, 21 U.S.C. section 360-bbb-3(b)(1), unless the authorization is terminated or revoked sooner.   Performed at [facility], [place] [address] [place], [place] [address]   Blood culture (routine x 2)     Status: None   Collection Time: 06/08/22  4:36 PM   Specimen: BLOOD  Result Value Ref Range Status   Specimen Description   Final    BLOOD PORTA CATH Performed at [place] [address], [place], [place] [address]    Special Requests   Final    BOTTLES DRAWN AEROBIC AND ANAEROBIC Blood Culture adequate volume Performed at [place] [address], [place], [place] [address]    Culture   Final    NO GROWTH 5 DAYS Performed at [facility], [place] [address] [place], [place] [address]    Report Status 06/13/2022 FINAL  Final     Radiology Studies: MR BRAIN W WO CONTRAST  Result Date: 06/12/2022 CLINICAL DATA:  Initial evaluation for mental status change, unknown cause. History of metastatic  breast cancer. EXAM: MRI HEAD WITHOUT AND WITH CONTRAST TECHNIQUE: Multiplanar, multiecho pulse sequences of the brain and surrounding structures were obtained without and with intravenous contrast. CONTRAST:  59m GADAVIST GADOBUTROL 1 MMOL/ML IV SOLN COMPARISON:  Prior CT from 06/08/2022 as well as previous MRI from 01/29/2022. FINDINGS: Brain: Diffuse prominence of the CSF containing spaces compatible with generalized cerebral atrophy. Sequelae of prior left frontal craniotomy. Encephalomalacia and gliosis within the underlying left frontal lobe, stable. Additional confluent T2/FLAIR hyperintensity involving the supratentorial cerebral white matter could reflect chronic microvascular ischemic disease and/or post treatment changes (or a combination there of). Overall, appearance is stable from previous. No acute or subacute ischemia.  No acute intracranial hemorrhage. Multiple scattered chronic micro hemorrhages noted, also stable from prior, and likely reflecting post treatment changes. No mass lesion, midline shift or mass effect. Mild ventricular prominence related to global parenchymal volume loss of hydrocephalus. No extra-axial fluid collection. Pituitary gland and suprasellar region within normal limits. Mild smooth postoperative dural enhancement noted overlying the left cerebral convexity. No other abnormal enhancement or evidence for new or recurrent metastatic disease. Vascular: Major intracranial vascular flow voids are maintained. Skull and upper cervical spine: Craniocervical junction within normal limits. Bone marrow signal intensity diffusely heterogeneous without worrisome osseous lesion. Prior left craniotomy. Scalp soft tissues demonstrate no acute finding. Sinuses/Orbits: Prior ocular lens replacement on the left. Globes orbital soft tissues demonstrate no acute finding. Mild scattered mucosal thickening noted about the ethmoidal air cells and maxillary sinuses. Moderate right greater than left  mastoid effusions, similar to prior, likely chronic. Visualized nasopharynx unremarkable. Other: None. IMPRESSION: 1. Stable brain MRI with no evidence for residual or recurrent intracranial metastatic disease. No other acute intracranial abnormality. 2. Chronic findings as detailed above, stable. Electronically Signed   By: Jeannine Boga M.D.   On: 06/12/2022 23:55    Scheduled Meds:  amLODipine  5 mg Oral Daily   divalproex  250 mg Oral BID   enoxaparin (LOVENOX) injection  70 mg Subcutaneous Q24H   gabapentin  300 mg Oral BID   lisinopril  10 mg Oral Daily   molnupiravir EUA  4 capsule Oral BID   pantoprazole  40 mg Oral Daily   sodium chloride flush  10-40 mL Intracatheter Q12H   venlafaxine XR  75 mg Oral Q breakfast   Continuous Infusions:    LOS: 4 days    Flora Lipps, MD [facility] Available via Epic secure chat 7am-7pm After these hours, please refer to coverage provider listed on [email or web address] 06/13/2022, 8:04 AM

## 2022-06-14 DIAGNOSIS — G620 Drug-induced polyneuropathy: Secondary | ICD-10-CM | POA: Diagnosis not present

## 2022-06-14 DIAGNOSIS — G9341 Metabolic encephalopathy: Secondary | ICD-10-CM | POA: Diagnosis not present

## 2022-06-14 DIAGNOSIS — U071 COVID-19: Secondary | ICD-10-CM | POA: Diagnosis not present

## 2022-06-14 DIAGNOSIS — C50911 Malignant neoplasm of unspecified site of right female breast: Secondary | ICD-10-CM | POA: Diagnosis not present

## 2022-06-14 NOTE — TOC Progression Note (Signed)
Transition of Care [facility]) - Progression Note    Patient Details  Name: Monica Mccoy MRN: [id number] Date of Birth: 1965-12-30  Transition of Care [facility]) CM/SW [place], LCSW Phone Number: 06/14/2022, 11:06 AM  Clinical Narrative:    CSW spoke with pt's sisters who have accepted SNF bed offer for [facility]. Pt will be able to transfer to their facility once she has completed her COVID isolation period.    Expected Discharge Plan: [place] Barriers to Discharge: SNF Covid, Continued Medical Work up  Expected Discharge Plan and Services Expected Discharge Plan: [place] In-house Referral: NA Discharge Planning Services: CM Consult Post Acute Care Choice: [place] Living arrangements for the past 2 months: Single Family Home                 DME Arranged: N/A DME Agency: NA                   Social Determinants of Health (SDOH) Interventions    Readmission Risk Interventions    06/11/2022   11:48 AM 06/10/2022   10:14 AM  Readmission Risk Prevention Plan  Transportation Screening Complete Complete  PCP or Specialist Appt within 5-7 Days Complete Complete  Home Care Screening Complete Complete  Medication Review (RN CM) Complete Complete

## 2022-06-14 NOTE — Progress Notes (Incomplete)
Patient Care Team: Haydee Salter, MD as PCP - General (Family Medicine) Nicholas Lose, MD as Consulting Physician (Hematology and Oncology) Cameron Sprang, MD as Consulting Physician (Neurology) Pickenpack-Cousar, Carlena Sax, NP as Nurse Practitioner (Nurse Practitioner)  DIAGNOSIS: No diagnosis found.  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 BRCA gene testing was negative   08/06/2012 Surgery   Bilateral mastectomies:  Right breast: Invasive ductal carcinoma, 0.7cm, grade 2, with extensive DCIS, clear margins, 8/11 lymph nodes positive, measuring up to 3.8cm,  Left breast, no evidence of malignancy and 6 lymph nodes negative for carcinoma.    2014 - 2015 Chemotherapy   adjuvant chemotherapy with 4 cycles of dose dense Adriamycin and Cytoxan followed by 4 cycles of Taxol Herceptin and then Herceptin maintenance. She underwent right chest radiation.       12/24/2013 Relapse/Recurrence   Dizziness, headaches, ataxia, and memory loss. Head CT showed a 1.8cm and a 2.0cm frontal lobe masses. She underwent resection of both masses on 01/07/14,: Moderately differentiated carcinoma, likely of breast origin, HER-2 positive, ER/PR negative, CK7 positive.   2015 - 2015 Radiation Therapy   Received whole brain radiation    Chemotherapy   Taxotere, Herceptin and Perjeta, but Taxotere was discontinued after 4 cycles       11/21/2020 - 04/03/2022 Chemotherapy   Patient is on Treatment Plan : BREAST Trastuzumab q21d     02/09/2022 Cancer Staging   Staging form: Breast, AJCC 8th Edition - Pathologic: Stage IV (pM1) - Signed by Gardenia Phlegm, NP on 02/09/2022   05/07/2022 -  Chemotherapy   Patient is on Treatment Plan : BREAST Trastuzumab IV (8/6)  or SQ (600) D1 q21d       CHIEF COMPLIANT: Follow-up on Herceptin therapy    INTERVAL HISTORY: Monica Mccoy is a 56 y.o. with above-mentioned history of metastatic breast cancer who is currently on Herceptin maintenance therapy. She presents to the clinic today for follow-up.    ALLERGIES:  is allergic to dilaudid [hydromorphone hcl] and morphine and related.  MEDICATIONS:  No current facility-administered medications for this visit.   No current outpatient medications on file.   Facility-Administered Medications Ordered in Other Visits  Medication Dose Route Frequency Provider Last Rate Last Admin   acetaminophen (TYLENOL) tablet 650 mg  650 mg Oral Q6H PRN Lenore Cordia, MD   650 mg at 06/12/22 2253   Or   acetaminophen (TYLENOL) suppository 650 mg  650 mg Rectal Q6H PRN Lenore Cordia, MD       amLODipine (NORVASC) tablet 5 mg  5 mg Oral Daily [place] Donnamarie Poag, DO   5 mg at 06/14/22 0940   bisacodyl (DULCOLAX) EC tablet 5 mg  5 mg Oral Daily PRN Lenore Cordia, MD       divalproex (DEPAKOTE) DR tablet 250 mg  250 mg Oral BID Zada Finders R, MD   250 mg at 06/14/22 0940   enoxaparin (LOVENOX) injection 70 mg  70 mg Subcutaneous Q24H Madueme, Elvira C, RPH   70 mg at 06/13/22 2129   gabapentin (NEURONTIN) capsule 300 mg  300 mg Oral BID [place] Eric J, DO   300 mg at 06/14/22 0940   hydrALAZINE (APRESOLINE) injection 10 mg  10 mg Intravenous Q6H PRN Posey Pronto,  Vishal R, MD       lisinopril (ZESTRIL) tablet 10 mg  10 mg Oral Daily Zada Finders R, MD   10 mg at 06/14/22 0940   ondansetron (ZOFRAN) tablet 4 mg  4 mg Oral Q6H PRN Lenore Cordia, MD       Or   ondansetron (ZOFRAN) injection 4 mg  4 mg Intravenous Q6H PRN Lenore Cordia, MD       pantoprazole (PROTONIX) EC tablet 40 mg  40 mg Oral Daily Zada Finders R, MD   40 mg at 06/14/22 0940   senna-docusate (Senokot-S) tablet 1 tablet  1 tablet Oral QHS PRN Zada Finders R, MD       sodium chloride flush (NS) 0.9 % injection  10-40 mL  10-40 mL Intracatheter Q12H Patel, Vishal R, MD   10 mL at 06/14/22 0941   sodium chloride flush (NS) 0.9 % injection 10-40 mL  10-40 mL Intracatheter PRN Lenore Cordia, MD       venlafaxine XR (EFFEXOR-XR) 24 hr capsule 75 mg  75 mg Oral Q breakfast Lenore Cordia, MD   75 mg at 06/14/22 0940    PHYSICAL EXAMINATION: ECOG PERFORMANCE STATUS: {CHL ONC ECOG PS:[id number]}  There were no vitals filed for this visit. There were no vitals filed for this visit.  BREAST:*** No palpable masses or nodules in either right or left breasts. No palpable axillary supraclavicular or infraclavicular adenopathy no breast tenderness or nipple discharge. (exam performed in the presence of a chaperone)  LABORATORY DATA:  I have reviewed the data as listed    Latest Ref Rng & Units 06/13/2022    5:57 AM 06/12/2022    5:44 AM 06/11/2022    4:37 AM  CMP  Glucose 70 - 99 mg/dL 92  97  97   BUN 6 - 20 mg/dL $Remove'21  22  12   'bHcBuhr$ Creatinine 0.44 - 1.00 mg/dL 0.89  1.11  0.91   Sodium 135 - 145 mmol/L 140  137  136   Potassium 3.5 - 5.1 mmol/L 3.6  3.8  3.4   Chloride 98 - 111 mmol/L 104  103  100   CO2 22 - 32 mmol/L $RemoveB'29  27  27   'UnXXpRcu$ Calcium 8.9 - 10.3 mg/dL 8.9  8.8  8.8     Lab Results  Component Value Date   WBC 2.6 (L) 06/13/2022   HGB 12.0 06/13/2022   HCT 38.7 06/13/2022   MCV 90.4 06/13/2022   PLT 206 06/13/2022   NEUTROABS 3.2 06/08/2022    ASSESSMENT & PLAN:  No problem-specific Assessment & Plan notes found for this encounter.    No orders of the defined types were placed in this encounter.  The patient has a good understanding of the overall plan. she agrees with it. she will call with any problems that may develop before the next visit here. Total time spent: 30 mins including face to face time and time spent for planning, charting and co-ordination of care   Suzzette Righter, [place] 06/14/22    I Gardiner Coins am scribing for Dr. Lindi Adie  ***

## 2022-06-14 NOTE — Progress Notes (Signed)
PROGRESS NOTE    Monica Mccoy  OZH:[id number] DOB: 1965/09/15 DOA: 06/08/2022 PCP: Haydee Salter, MD    Brief Narrative:   Monica Mccoy is a 56 y.o. female with past medical history of right breast cancer status post brain mets in the past, history of bilateral mastectomy and chemotherapy with whole brain radiation currently on Herceptin,  seizure disorder, HTN, chronic pedal edema, depression presented to hospital with altered mental status with increased increased lethargy and decreased level of interaction over the last 1.5 weeks.  At baseline patient usually ambulates with the use of a walker.  She has some mild confusion at baseline but can usually participate in meaningful conversation.  In the ED, patient had elevated blood pressure.Labs showed a potassium of 3.4.  Valproic acid level was 31.  SARS-CoV-2 PCR was positive.  Urinalysis negative for UTI. Portable chest x-ray negative for infiltrate.  CT head and CT cervical spine was negative for acute findings. Unchanged compression deformity of the C6 vertebral body with up to approximately 50% loss of vertebral body height noted. CT abdomen/pelvis with contrast negative for acute findings but unchanged fibroids and tiny gallstones.  Patient was then considered for admission to hospital for altered mental status.    Assessment & Plan:  Principal Problem:   COVID-19 virus infection Active Problems:   Acute metabolic encephalopathy   Localization-related (focal) (partial) symptomatic epilepsy and epileptic syndromes with complex partial seizures, not intractable, without status epilepticus ([place])   Generalized weakness   Cancer of right breast metastatic to brain [facility])   Essential hypertension   Chemotherapy-induced neuropathy ([place])   Palliative care encounter   Goals of care, counseling/discussion   Counseling and coordination of care   Altered mental status   COVID-19 virus infection Chest x-ray without infiltrate.  Not hypoxic.   Continue Molnupiravir '800mg'$  PO BID x 5 days.  Continue isolation precautions for 10 days from the date of initial diagnosis blood cultures negative in 5 days.   Acute metabolic encephalopathy Improved at this time.  Patient had increased confusion from baseline secondary to COVID infection.  Patient is alert awake and Communicative answering appropriately but has impaired memory and orientation.  MRI of the brain without new findings.  Tramadol on hold.  Gabapentin started at a lower dose.  Continue treatment for COVID.     Hypokalemia: Improved  Localization-related (focal) (partial) symptomatic epilepsy and epileptic syndromes with complex partial seizures, not intractable, without status epilepticus ([place]) No further seizure episodes.  Continue Depakote 250 mg twice daily   Cancer of right breast metastatic to brain [facility]) Follows with oncology, Dr. Lindi Adie.  Status post bilateral mastectomy 07/2012 followed by chemotherapy and radiation.  Found to have 2 frontal lobe tumors 11/2013 and underwent resection followed with whole brain radiation and chemotherapy.  Currently on Herceptin continue therapy.  MRI of the brain on 04/12/2022 showed  stable brain MRI with no evidence for residual or recurrent intracranial metastatic disease.  Palliative care on board   Chemotherapy-induced neuropathy ([place]) Has been started on gabapentin at '300mg'$  PO BID.  Tramadol on hold   Essential hypertension Continue lisinopril, amlodipine  Generalized weakness debility, deconditioning Worsening generalized weakness.  At baseline uses walker.  PT OT recommending skilled nursing facility placement at this time.  History of several falls  DVT prophylaxis:   Lovenox subcu    Code Status: Full Code  Family Communication:  None at bedside  Disposition Plan: Skilled nursing facility.  Status is: Inpatient  Remains inpatient appropriate  because: Pending SNF placement, will need 10-day quarantine prior to discharge  per TOC.  Medically stable for disposition    Consultants:  Palliative care  Procedures:  none  Antimicrobials:  none   Subjective: Today, patient was seen and examined at bedside.  Patient denies any nausea vomiting fever chills or rigor.  Has been having some bowel movements.  Has poor memory and orientation.    Objective: Vitals:   06/13/22 0521 06/13/22 1301 06/13/22 2014 06/14/22 0555  BP: 119/78 111/84 109/80 104/82  Pulse: (!) 56 74 84 81  Resp: '16 16 16 16  '$ Temp: 97.6 F (36.4 C) 98.2 F (36.8 C) 98.3 F (36.8 C) 98.2 F (36.8 C)  TempSrc:  Oral Oral Oral  SpO2: 99% 99% 100% 100%  Weight:      Height:        Intake/Output Summary (Last 24 hours) at 06/14/2022 1036 Last data filed at 06/14/2022 1000 Gross per 24 hour  Intake 290 ml  Output 1000 ml  Net -710 ml    Filed Weights   06/08/22 2035  Weight: (!) 144.6 kg    Examination:  General:  Average built, not in obvious distress, chronically ill,  HENT:   No scleral pallor or icterus noted. Oral mucosa is moist.  Chest:  Clear breath sounds.  Diminished breath sounds bilaterally. No crackles or wheezes.  Left chest wall port in place. CVS: S1 &S2 heard. No murmur.  Regular rate and rhythm. Abdomen: Soft, nontender, nondistended.  Bowel sounds are heard.   Extremities: No cyanosis, clubbing or edema.  Peripheral pulses are palpable. Psych: Alert, awake and oriented to place, impaired memory., flat affect. CNS:  No cranial nerve deficits.  Generalized weakness noted. Skin: Warm and dry.  No rashes noted.   Data Reviewed: I have personally reviewed following labs and imaging studies  CBC: Recent Labs  Lab 06/08/22 1457 06/09/22 0420 06/11/22 0437 06/12/22 0544 06/13/22 0557  WBC 4.4 2.8* 2.1* 2.4* 2.6*  NEUTROABS 3.2  --   --   --   --   HGB 11.2* 10.2* 12.9 12.0 12.0  HCT 36.0 33.0* 41.4 37.5 38.7  MCV 91.4 90.7 89.8 89.5 90.4  PLT 213 199 199 193 341    Basic Metabolic Panel: Recent  Labs  Lab 06/09/22 0420 06/10/22 0125 06/11/22 0437 06/12/22 0544 06/13/22 0557  NA 136 136 136 137 140  K 3.8 3.7 3.4* 3.8 3.6  CL 104 99 100 103 104  CO2 '28 28 27 27 29  '$ GLUCOSE 91 98 97 97 92  BUN '10 8 12 '$ 22* 21*  CREATININE 0.83 0.79 0.91 1.11* 0.89  CALCIUM 8.3* 9.3 8.8* 8.8* 8.9  MG  --  2.2  --  2.3 2.2  PHOS  --  3.5  --   --   --     GFR: Estimated Creatinine Clearance: 105.3 mL/min (by C-G formula based on SCr of 0.89 mg/dL). Liver Function Tests: Recent Labs  Lab 06/08/22 1457  AST 14*  ALT 11  ALKPHOS 69  BILITOT 1.0  PROT 7.7  ALBUMIN 3.7    No results for input(s): "LIPASE", "AMYLASE" in the last 168 hours. No results for input(s): "AMMONIA" in the last 168 hours. Coagulation Profile: No results for input(s): "INR", "PROTIME" in the last 168 hours. Cardiac Enzymes: No results for input(s): "CKTOTAL", "CKMB", "CKMBINDEX", "TROPONINI" in the last 168 hours. BNP (last 3 results) No results for input(s): "PROBNP" in the last 8760 hours. HbA1C: No results  for input(s): "HGBA1C" in the last 72 hours. CBG: No results for input(s): "GLUCAP" in the last 168 hours. Lipid Profile: No results for input(s): "CHOL", "HDL", "LDLCALC", "TRIG", "CHOLHDL", "LDLDIRECT" in the last 72 hours. Thyroid Function Tests: No results for input(s): "TSH", "T4TOTAL", "FREET4", "T3FREE", "THYROIDAB" in the last 72 hours. Anemia Panel: No results for input(s): "VITAMINB12", "FOLATE", "FERRITIN", "TIBC", "IRON", "RETICCTPCT" in the last 72 hours. Sepsis Labs: Recent Labs  Lab 06/08/22 1458  LATICACIDVEN 0.7     Recent Results (from the past 240 hour(s))  SARS Coronavirus 2 by RT PCR (hospital order, performed in [facility] hospital lab) *cepheid single result test* Anterior Nasal Swab     Status: Abnormal   Collection Time: 06/08/22  3:28 PM   Specimen: Anterior Nasal Swab  Result Value Ref Range Status   SARS Coronavirus 2 by RT PCR POSITIVE (A) NEGATIVE Final     Comment: (NOTE) SARS-CoV-2 target nucleic acids are DETECTED  SARS-CoV-2 RNA is generally detectable in upper respiratory specimens  during the acute phase of infection.  Positive results are indicative  of the presence of the identified virus, but do not rule out bacterial infection or co-infection with other pathogens not detected by the test.  Clinical correlation with patient history and  other diagnostic information is necessary to determine patient infection status.  The expected result is negative.  Fact Sheet for Patients:   https://www.patel.info/   Fact Sheet for Healthcare Providers:   https://hall.com/    This test is not yet approved or cleared by the [place] FDA and  has been authorized for detection and/or diagnosis of SARS-CoV-2 by FDA under an Emergency Use Authorization (EUA).  This EUA will remain in effect (meaning this test can be used) for the duration of  the COVID-19 declaration under Section 564(b)(1)  of the Act, 21 U.S.C. section 360-bbb-3(b)(1), unless the authorization is terminated or revoked sooner.   Performed at [facility], [place] [address] [place], [place] [address]   Blood culture (routine x 2)     Status: None   Collection Time: 06/08/22  4:36 PM   Specimen: BLOOD  Result Value Ref Range Status   Specimen Description   Final    BLOOD PORTA CATH Performed at [facility] [address], [place], [place] [address]    Special Requests   Final    BOTTLES DRAWN AEROBIC AND ANAEROBIC Blood Culture adequate volume Performed at [facility] [address] [place], [place] [address]    Culture   Final    NO GROWTH 5 DAYS Performed at [facility], [place] [address] [place], [place] [address]    Report Status 06/13/2022 FINAL  Final     Radiology Studies: MR BRAIN W WO CONTRAST  Result Date: 06/12/2022 CLINICAL DATA:  Initial  evaluation for mental status change, unknown cause. History of metastatic breast cancer. EXAM: MRI HEAD WITHOUT AND WITH CONTRAST TECHNIQUE: Multiplanar, multiecho pulse sequences of the brain and surrounding structures were obtained without and with intravenous contrast. CONTRAST:  29m GADAVIST GADOBUTROL 1 MMOL/ML IV SOLN COMPARISON:  Prior CT from 06/08/2022 as well as previous MRI from 01/29/2022. FINDINGS: Brain: Diffuse prominence of the CSF containing spaces compatible with generalized cerebral atrophy. Sequelae of prior left frontal craniotomy. Encephalomalacia and gliosis within the underlying left frontal lobe, stable. Additional confluent T2/FLAIR hyperintensity involving the supratentorial cerebral white matter could reflect chronic microvascular ischemic disease and/or post treatment changes (or a  combination there of). Overall, appearance is stable from previous. No acute or subacute ischemia. No acute intracranial hemorrhage. Multiple scattered chronic micro hemorrhages noted, also stable from prior, and likely reflecting post treatment changes. No mass lesion, midline shift or mass effect. Mild ventricular prominence related to global parenchymal volume loss of hydrocephalus. No extra-axial fluid collection. Pituitary gland and suprasellar region within normal limits. Mild smooth postoperative dural enhancement noted overlying the left cerebral convexity. No other abnormal enhancement or evidence for new or recurrent metastatic disease. Vascular: Major intracranial vascular flow voids are maintained. Skull and upper cervical spine: Craniocervical junction within normal limits. Bone marrow signal intensity diffusely heterogeneous without worrisome osseous lesion. Prior left craniotomy. Scalp soft tissues demonstrate no acute finding. Sinuses/Orbits: Prior ocular lens replacement on the left. Globes orbital soft tissues demonstrate no acute finding. Mild scattered mucosal thickening noted about the  ethmoidal air cells and maxillary sinuses. Moderate right greater than left mastoid effusions, similar to prior, likely chronic. Visualized nasopharynx unremarkable. Other: None. IMPRESSION: 1. Stable brain MRI with no evidence for residual or recurrent intracranial metastatic disease. No other acute intracranial abnormality. 2. Chronic findings as detailed above, stable. Electronically Signed   By: Jeannine Boga M.D.   On: 06/12/2022 23:55    Scheduled Meds:  amLODipine  5 mg Oral Daily   divalproex  250 mg Oral BID   enoxaparin (LOVENOX) injection  70 mg Subcutaneous Q24H   gabapentin  300 mg Oral BID   lisinopril  10 mg Oral Daily   pantoprazole  40 mg Oral Daily   sodium chloride flush  10-40 mL Intracatheter Q12H   venlafaxine XR  75 mg Oral Q breakfast   Continuous Infusions:    LOS: 5 days    Flora Lipps, MD [facility] Available via Epic secure chat 7am-7pm After these hours, please refer to coverage provider listed on [email or web address] 06/14/2022, 10:36 AM

## 2022-06-15 DIAGNOSIS — R4182 Altered mental status, unspecified: Secondary | ICD-10-CM | POA: Diagnosis not present

## 2022-06-15 DIAGNOSIS — Z515 Encounter for palliative care: Secondary | ICD-10-CM

## 2022-06-15 DIAGNOSIS — I10 Essential (primary) hypertension: Secondary | ICD-10-CM

## 2022-06-15 DIAGNOSIS — U071 COVID-19: Secondary | ICD-10-CM | POA: Diagnosis not present

## 2022-06-15 DIAGNOSIS — C50911 Malignant neoplasm of unspecified site of right female breast: Secondary | ICD-10-CM | POA: Diagnosis not present

## 2022-06-15 DIAGNOSIS — G9341 Metabolic encephalopathy: Secondary | ICD-10-CM | POA: Diagnosis not present

## 2022-06-15 DIAGNOSIS — Z7189 Other specified counseling: Secondary | ICD-10-CM

## 2022-06-15 NOTE — Progress Notes (Signed)
Physical Therapy Treatment Patient Details Name: Monica Mccoy MRN: [id number] DOB: 06/20/1966 Today's Date: 06/15/2022   History of Present Illness Monica Mccoy is a 56 y.o. 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 She was found to have COVID-19.    PT Comments    Pt very pleasant and mildly impulsive and progressing well with mobility vs last visit.  Pt min assist to ambulate 110' in hall with RW, cues for RW management and safety, multiple standing rest breaks and chair follow for safety. Pt pleased with progress this date.  Recommendations for follow up therapy are one component of a multi-disciplinary discharge planning process, led by the attending physician.  Recommendations may be updated based on patient status, additional functional criteria and insurance authorization.  Follow Up Recommendations  Skilled nursing-short term rehab (<3 hours/day) Can patient physically be transported by private vehicle: Yes   Assistance Recommended at Discharge Frequent or constant Supervision/Assistance  Patient can return home with the following Assistance with cooking/housework;A little help with walking and/or transfers;A little help with bathing/dressing/bathroom;Assist for transportation;Help with stairs or ramp for entrance   Equipment Recommendations  None recommended by PT    Recommendations for Other Services       Precautions / Restrictions Precautions Precautions: Fall Restrictions Weight Bearing Restrictions: No Other Position/Activity Restrictions: contact/airborne     Mobility  Bed Mobility               General bed mobility comments: Pt up in chair and requests back to same    Transfers Overall transfer level:  Needs assistance Equipment used: Rolling walker (2 wheels) Transfers: Sit to/from Stand Sit to Stand: Min assist           General transfer comment: Steady assist with cues for safety in transitions    Ambulation/Gait Ambulation/Gait assistance: Min assist, Min guard Gait Distance (Feet): 112 Feet Assistive device: Rolling walker (2 wheels) Gait Pattern/deviations: Step-to pattern, Step-through pattern, Decreased step length - right, Decreased step length - left, Shuffle, Trunk flexed Gait velocity: decreased     General Gait Details: INcreased time with multiple standing rest breaks and cues for posture, position from RW and safety awareness   Stairs             Wheelchair Mobility    Modified Rankin (Stroke Patients Only)       Balance Overall balance assessment: History of Falls, Needs assistance Sitting-balance support: Feet supported Sitting balance-Leahy Scale: Good     Standing balance support: Bilateral upper extremity supported Standing balance-Leahy Scale: Poor                              Cognition Arousal/Alertness: Awake/alert Behavior During Therapy: WFL for tasks assessed/performed Overall Cognitive Status: No family/caregiver present to determine baseline cognitive functioning Area of Impairment: Problem solving                 Orientation Level: Disoriented to, Place, Time, Situation     Following Commands: Follows one step commands with increased time Safety/Judgement: Decreased awareness of safety Awareness: Intellectual Problem Solving: Slow processing, Decreased initiation, Difficulty sequencing, Requires verbal cues, Requires tactile cues General Comments: pt pleasant, follows commands with cues and increased time, speech tangential at times  Exercises      General Comments        Pertinent Vitals/Pain Pain Assessment Pain Assessment: No/denies pain    Home Living                           Prior Function            PT Goals (current goals can now be found in the care plan section) Acute Rehab PT Goals Patient Stated Goal: none stated PT Goal Formulation: Patient unable to participate in goal setting Time For Goal Achievement: 06/23/22 Potential to Achieve Goals: Fair Progress towards PT goals: Progressing toward goals    Frequency    Min 2X/week      PT Plan Current plan remains appropriate    Co-evaluation              AM-PAC PT "6 Clicks" Mobility   Outcome Measure  Help needed turning from your back to your side while in a flat bed without using bedrails?: A Little Help needed moving from lying on your back to sitting on the side of a flat bed without using bedrails?: A Little Help needed moving to and from a bed to a chair (including a wheelchair)?: A Little Help needed standing up from a chair using your arms (e.g., wheelchair or bedside chair)?: A Little Help needed to walk in hospital room?: A Little Help needed climbing 3-5 steps with a railing? : A Lot 6 Click Score: 17    End of Session Equipment Utilized During Treatment: Gait belt Activity Tolerance: Patient tolerated treatment well Patient left: in chair;with call bell/phone within reach;with chair alarm set Nurse Communication: Mobility status PT Visit Diagnosis: Unsteadiness on feet (R26.81);Muscle weakness (generalized) (M62.81);Difficulty in walking, not elsewhere classified (R26.2)     Time: 0932-3557 PT Time Calculation (min) (ACUTE ONLY): 27 min  Charges:  $Gait Training: 23-37 mins                     [place] Pager [phone] Office [phone]    [place] 06/15/2022, 2:49 PM

## 2022-06-15 NOTE — Care Management Important Message (Signed)
Important Message  Patient Details IM Letter given Name: Monica Mccoy MRN: [id number] Date of Birth: Mar 04, 1966   Medicare Important Message Given:  Yes     Kerin Salen 06/15/2022, 11:59 AM

## 2022-06-15 NOTE — Progress Notes (Signed)
PROGRESS NOTE    Monica Mccoy  PJA:[id number] DOB: [birth year] DOA: 06/08/2022 PCP: Haydee Salter, MD    Brief Narrative:   Monica Mccoy is a 56 y.o. female with past medical history of right breast cancer status post brain mets in the past, history of bilateral mastectomy and chemotherapy with whole brain radiation currently on Herceptin,  seizure disorder, HTN, chronic pedal edema, depression presented to hospital with altered mental status with increased increased lethargy and decreased level of interaction over the last 1.5 weeks.  At baseline patient usually ambulates with the use of a walker.  She has some mild confusion at baseline but can usually participate in meaningful conversation.  In the ED, patient had elevated blood pressure.Labs showed a potassium of 3.4.  Valproic acid level was 31.  SARS-CoV-2 PCR was positive.  Urinalysis negative for UTI. Portable chest x-ray negative for infiltrate.  CT head and CT cervical spine was negative for acute findings. Unchanged compression deformity of the C6 vertebral body with up to approximately 50% loss of vertebral body height noted. CT abdomen/pelvis with contrast negative for acute findings but unchanged fibroids and tiny gallstones.  Patient was then considered for admission to hospital for altered mental status.    Assessment & Plan:  Principal Problem:   COVID-19 virus infection Active Problems:   Acute metabolic encephalopathy   Localization-related (focal) (partial) symptomatic epilepsy and epileptic syndromes with complex partial seizures, not intractable, without status epilepticus ([place])   Generalized weakness   Cancer of right breast metastatic to brain [facility])   Essential hypertension   Chemotherapy-induced neuropathy ([place])   Palliative care encounter   Goals of care, counseling/discussion   Counseling and coordination of care   Altered mental status   COVID-19 virus infection Chest x-ray without infiltrate.  Not hypoxic.   Completed molnupiravir '800mg'$  PO BID x 5 days.  Continue isolation precautions for 10 days from the date of initial diagnosis. blood cultures negative in 5 days.   Acute metabolic encephalopathy Improved at this time.  Patient had increased confusion from baseline secondary to COVID infection.  Patient is alert awake and Communicative answering appropriately but has impaired memory and orientation.  MRI of the brain without new findings.  Tramadol on hold.  Gabapentin started at a lower dose.  Continue treatment for COVID.     Hypokalemia: Improved, latest potassium of 3.6  Localization-related (focal) (partial) symptomatic epilepsy and epileptic syndromes with complex partial seizures, not intractable, without status epilepticus ([place]) No further seizure episodes.  Continue Depakote 250 mg twice daily   Cancer of right breast metastatic to brain [facility]) Follows with oncology, Dr. Lindi Adie.  Status post bilateral mastectomy 07/2012 followed by chemotherapy and radiation.  Found to have 2 frontal lobe tumors on 11/2013 and underwent resection followed with whole brain radiation and chemotherapy.  Currently on Herceptin continue therapy.  MRI of the brain on 04/12/2022 showed  stable brain MRI with no evidence for residual or recurrent intracranial metastatic disease.  Palliative care on board   Chemotherapy-induced neuropathy ([place]) Has been started on gabapentin at '300mg'$  PO BID.  Tramadol on hold   Essential hypertension Continue lisinopril, amlodipine  Generalized weakness debility, deconditioning Worsening generalized weakness.  At baseline uses walker.  PT, OT recommending skilled nursing facility placement at this time.  History of several falls  DVT prophylaxis:   Lovenox subcu    Code Status: Full Code  Family Communication:  I spoke with the patient's sister on the phone and updated her  about the clinical condition of the patient.  Disposition Plan: Skilled nursing facility.  Status  is: Inpatient  Remains inpatient appropriate because: Pending SNF placement, will need 10-day quarantine prior to discharge per TOC until 06/18/2022.  Medically stable for disposition    Consultants:  Palliative care  Procedures:  none  Antimicrobials:  none   Subjective: Today, patient was seen and examined at bedside.  Denies any nausea, vomiting, fever, chills or rigor.  No interval complaints.    Objective: Vitals:   06/14/22 0555 06/14/22 1233 06/14/22 2024 06/15/22 0530  BP: 104/82 91/73 101/62 112/82  Pulse: 81 81 88 72  Resp: '16 18 18 18  '$ Temp: 98.2 F (36.8 C) 98.6 F (37 C) 98.4 F (36.9 C) 98.4 F (36.9 C)  TempSrc: Oral Oral Oral Oral  SpO2: 100% 98% 98% 97%  Weight:      Height:        Intake/Output Summary (Last 24 hours) at 06/15/2022 1115 Last data filed at 06/15/2022 0430 Gross per 24 hour  Intake 200 ml  Output 650 ml  Net -450 ml    Filed Weights   06/08/22 2035  Weight: (!) 144.6 kg    Examination:  General:  Average built, not in obvious distress, chronically ill, HENT:   No scleral pallor or icterus noted. Oral mucosa is moist.  Chest:  Clear breath sounds.  Diminished breath sounds bilaterally. No crackles or wheezes.  Left chest wall port in place CVS: S1 &S2 heard. No murmur.  Regular rate and rhythm. Abdomen: Soft, nontender, nondistended.  Bowel sounds are heard.   Extremities: No cyanosis, clubbing or edema.  Peripheral pulses are palpable. Psych: Alert, awake disoriented, impaired memory, flat affect. CNS:  No cranial nerve deficits.  Power equal in all extremities.   Skin: Warm and dry.  No rashes noted.   Data Reviewed: I have personally reviewed following labs and imaging studies  CBC: Recent Labs  Lab 06/08/22 1457 06/09/22 0420 06/11/22 0437 06/12/22 0544 06/13/22 0557  WBC 4.4 2.8* 2.1* 2.4* 2.6*  NEUTROABS 3.2  --   --   --   --   HGB 11.2* 10.2* 12.9 12.0 12.0  HCT 36.0 33.0* 41.4 37.5 38.7  MCV 91.4 90.7  89.8 89.5 90.4  PLT 213 199 199 193 672    Basic Metabolic Panel: Recent Labs  Lab 06/09/22 0420 06/10/22 0125 06/11/22 0437 06/12/22 0544 06/13/22 0557  NA 136 136 136 137 140  K 3.8 3.7 3.4* 3.8 3.6  CL 104 99 100 103 104  CO2 '28 28 27 27 29  '$ GLUCOSE 91 98 97 97 92  BUN '10 8 12 '$ 22* 21*  CREATININE 0.83 0.79 0.91 1.11* 0.89  CALCIUM 8.3* 9.3 8.8* 8.8* 8.9  MG  --  2.2  --  2.3 2.2  PHOS  --  3.5  --   --   --     GFR: Estimated Creatinine Clearance: 105.3 mL/min (by C-G formula based on SCr of 0.89 mg/dL). Liver Function Tests: Recent Labs  Lab 06/08/22 1457  AST 14*  ALT 11  ALKPHOS 69  BILITOT 1.0  PROT 7.7  ALBUMIN 3.7    No results for input(s): "LIPASE", "AMYLASE" in the last 168 hours. No results for input(s): "AMMONIA" in the last 168 hours. Coagulation Profile: No results for input(s): "INR", "PROTIME" in the last 168 hours. Cardiac Enzymes: No results for input(s): "CKTOTAL", "CKMB", "CKMBINDEX", "TROPONINI" in the last 168 hours. BNP (last 3 results) No  results for input(s): "PROBNP" in the last 8760 hours. HbA1C: No results for input(s): "HGBA1C" in the last 72 hours. CBG: No results for input(s): "GLUCAP" in the last 168 hours. Lipid Profile: No results for input(s): "CHOL", "HDL", "LDLCALC", "TRIG", "CHOLHDL", "LDLDIRECT" in the last 72 hours. Thyroid Function Tests: No results for input(s): "TSH", "T4TOTAL", "FREET4", "T3FREE", "THYROIDAB" in the last 72 hours. Anemia Panel: No results for input(s): "VITAMINB12", "FOLATE", "FERRITIN", "TIBC", "IRON", "RETICCTPCT" in the last 72 hours. Sepsis Labs: Recent Labs  Lab 06/08/22 1458  LATICACIDVEN 0.7     Recent Results (from the past 240 hour(s))  SARS Coronavirus 2 by RT PCR (hospital order, performed in [facility] hospital lab) *cepheid single result test* Anterior Nasal Swab     Status: Abnormal   Collection Time: 06/08/22  3:28 PM   Specimen: Anterior Nasal Swab  Result Value Ref  Range Status   SARS Coronavirus 2 by RT PCR POSITIVE (A) NEGATIVE Final    Comment: (NOTE) SARS-CoV-2 target nucleic acids are DETECTED  SARS-CoV-2 RNA is generally detectable in upper respiratory specimens  during the acute phase of infection.  Positive results are indicative  of the presence of the identified virus, but do not rule out bacterial infection or co-infection with other pathogens not detected by the test.  Clinical correlation with patient history and  other diagnostic information is necessary to determine patient infection status.  The expected result is negative.  Fact Sheet for Patients:   https://www.patel.info/   Fact Sheet for Healthcare Providers:   https://hall.com/    This test is not yet approved or cleared by the [place] FDA and  has been authorized for detection and/or diagnosis of SARS-CoV-2 by FDA under an Emergency Use Authorization (EUA).  This EUA will remain in effect (meaning this test can be used) for the duration of  the COVID-19 declaration under Section 564(b)(1)  of the Act, 21 U.S.C. section 360-bbb-3(b)(1), unless the authorization is terminated or revoked sooner.   Performed at [facility], [place] [address], [place], [place] [address]   Blood culture (routine x 2)     Status: None   Collection Time: 06/08/22  4:36 PM   Specimen: BLOOD  Result Value Ref Range Status   Specimen Description   Final    BLOOD PORTA CATH Performed at [facility] [address], [place], [place] [address]    Special Requests   Final    BOTTLES DRAWN AEROBIC AND ANAEROBIC Blood Culture adequate volume Performed at [facility] [address] [place], [place] [address]    Culture   Final    NO GROWTH 5 DAYS Performed at [facility], [place] [address], [place], [place] [address]    Report Status 06/13/2022 FINAL  Final     Radiology  Studies: No results found.  Scheduled Meds:  amLODipine  5 mg Oral Daily   divalproex  250 mg Oral BID   enoxaparin (LOVENOX) injection  70 mg Subcutaneous Q24H   gabapentin  300 mg Oral BID   lisinopril  10 mg Oral Daily   pantoprazole  40 mg Oral Daily   sodium chloride flush  10-40 mL Intracatheter Q12H   venlafaxine XR  75 mg Oral Q breakfast   Continuous Infusions:    LOS: 6 days    Flora Lipps, MD [facility] Available via Epic secure chat 7am-7pm After these hours, please refer to coverage provider listed on [email or web address] 06/15/2022, 11:15  AM  

## 2022-06-16 DIAGNOSIS — G9341 Metabolic encephalopathy: Secondary | ICD-10-CM | POA: Diagnosis not present

## 2022-06-16 DIAGNOSIS — E66813 Obesity, class 3: Secondary | ICD-10-CM | POA: Insufficient documentation

## 2022-06-16 DIAGNOSIS — C50911 Malignant neoplasm of unspecified site of right female breast: Secondary | ICD-10-CM | POA: Diagnosis not present

## 2022-06-16 DIAGNOSIS — U071 COVID-19: Secondary | ICD-10-CM | POA: Diagnosis not present

## 2022-06-16 DIAGNOSIS — R4182 Altered mental status, unspecified: Secondary | ICD-10-CM | POA: Diagnosis not present

## 2022-06-16 NOTE — Progress Notes (Signed)
PROGRESS NOTE    Modell Fendrick  ZGY:[id number] DOB: 06/17/1966 DOA: 06/08/2022 PCP: Haydee Salter, MD    Brief Narrative:   Monica Mccoy is a 56 y.o. female with past medical history of right breast cancer status post brain mets in the past, history of bilateral mastectomy and chemotherapy with whole brain radiation currently on Herceptin,  seizure disorder, HTN, chronic pedal edema, depression presented to hospital with altered mental status with increased increased lethargy and decreased level of interaction over the last 1.5 weeks.  At baseline patient usually ambulates with the use of a walker.  She has some mild confusion at baseline but can usually participate in meaningful conversation.  In the ED, patient had elevated blood pressure.Labs showed a potassium of 3.4.  Valproic acid level was 31.  SARS-CoV-2 PCR was positive.  Urinalysis negative for UTI. Portable chest x-ray negative for infiltrate.  CT head and CT cervical spine was negative for acute findings. Unchanged compression deformity of the C6 vertebral body with up to approximately 50% loss of vertebral body height noted. CT abdomen/pelvis with contrast negative for acute findings but unchanged fibroids and tiny gallstones.  Patient was then considered for admission to hospital for altered mental status.    At this time, patient is stable and is awaiting for skilled nursing facility placement.  Assessment & Plan:  Principal Problem:   COVID-19 virus infection Active Problems:   Acute metabolic encephalopathy   Localization-related (focal) (partial) symptomatic epilepsy and epileptic syndromes with complex partial seizures, not intractable, without status epilepticus ([place])   Generalized weakness   Cancer of right breast metastatic to brain [facility])   Essential hypertension   Chemotherapy-induced neuropathy ([place])   Palliative care encounter   Goals of care, counseling/discussion   Counseling and coordination of care    Altered mental status   Obesity, Class III, BMI 40-49.9 (morbid obesity) ([place])   COVID-19 virus infection Chest x-ray without infiltrate.  Not hypoxic.  Completed molnupiravir '800mg'$  PO BID x 5 days.  Continue isolation precautions for 10 days from the date of initial diagnosis. blood cultures negative in 5 days.   Acute metabolic encephalopathy Improved at this time.  Patient had increased confusion from baseline secondary to COVID infection.  Patient is alert awake and Communicative answering appropriately but has impaired memory and orientation.  MRI of the brain without new findings.  Tramadol on hold.  Gabapentin started at a lower dose.  Continue treatment for COVID.     Hypokalemia: Improved, latest potassium of 3.6  Localization-related (focal) (partial) symptomatic epilepsy and epileptic syndromes with complex partial seizures, not intractable, without status epilepticus ([place]) No further seizure episodes.  Continue Depakote 250 mg twice daily   Cancer of right breast metastatic to brain [facility]) Follows with oncology, Dr. Lindi Adie.  Status post bilateral mastectomy 07/2012 followed by chemotherapy and radiation.  Found to have 2 frontal lobe tumors on 11/2013 and underwent resection followed with whole brain radiation and chemotherapy.  Currently on Herceptin continue therapy.  MRI of the brain on 04/12/2022 showed  stable brain MRI with no evidence for residual or recurrent intracranial metastatic disease.  Palliative care on board   Chemotherapy-induced neuropathy ([place]) Has been started on gabapentin at '300mg'$  PO BID.  Tramadol on hold   Essential hypertension Continue lisinopril, amlodipine  Generalized weakness debility, deconditioning Worsening generalized weakness.  At baseline uses walker.  PT, OT recommending skilled nursing facility placement at this time.  History of several falls  Morbid obesity. Body mass  index is 51.45 kg/m.  Present on admission.  Would benefit from weight  loss as outpatient.  DVT prophylaxis:   Lovenox subcu    Code Status: Full Code  Family Communication:  I spoke with the patient's sister on the phone and updated her about the clinical condition of the patient on 06/15/2022.  Disposition Plan: Skilled nursing facility.  Status is: Inpatient  Remains inpatient appropriate because:  Pending SNF placement, will need 10-day quarantine prior to discharge per TOC until 06/18/2022.  Medically stable for disposition    Consultants:  Palliative care  Procedures:  none  Antimicrobials:  none   Subjective: Today, patient was seen and examined in bedside.  Denies any nausea vomiting fever chills or rigor.  Awaiting for skilled nursing facility.  Objective: Vitals:   06/15/22 0530 06/15/22 1830 06/15/22 2026 06/16/22 0511  BP: 112/82 115/83 109/84 117/88  Pulse: 72 67 80 68  Resp: '18 18 16 16  '$ Temp: 98.4 F (36.9 C) 98.2 F (36.8 C) 98.2 F (36.8 C) 98.1 F (36.7 C)  TempSrc: Oral Oral Oral Oral  SpO2: 97% 99% 96% 99%  Weight:      Height:        Intake/Output Summary (Last 24 hours) at 06/16/2022 1234 Last data filed at 06/16/2022 0515 Gross per 24 hour  Intake --  Output 1900 ml  Net -1900 ml   Filed Weights   06/08/22 2035  Weight: (!) 144.6 kg    Examination: Body mass index is 51.45 kg/m.  General: Morbidly obese built, not in obvious distress, chronically ill HENT:   No scleral pallor or icterus noted. Oral mucosa is moist.  Chest:  Clear breath sounds.  No crackles or wheezes.  CVS: S1 &S2 heard. No murmur.  Regular rate and rhythm. Abdomen: Soft, nontender, nondistended.  Bowel sounds are heard.   Extremities: No cyanosis, clubbing or edema.  Peripheral pulses are palpable. Psych: Alert, awake and disoriented, impaired memory, CNS:  No cranial nerve deficits.  Power equal in all extremities.   Skin: Warm and dry.  No rashes noted.   Data Reviewed: I have personally reviewed following labs and  imaging studies  CBC: Recent Labs  Lab 06/11/22 0437 06/12/22 0544 06/13/22 0557  WBC 2.1* 2.4* 2.6*  HGB 12.9 12.0 12.0  HCT 41.4 37.5 38.7  MCV 89.8 89.5 90.4  PLT 199 193 379   Basic Metabolic Panel: Recent Labs  Lab 06/10/22 0125 06/11/22 0437 06/12/22 0544 06/13/22 0557  NA 136 136 137 140  K 3.7 3.4* 3.8 3.6  CL 99 100 103 104  CO2 '28 27 27 29  '$ GLUCOSE 98 97 97 92  BUN 8 12 22* 21*  CREATININE 0.79 0.91 1.11* 0.89  CALCIUM 9.3 8.8* 8.8* 8.9  MG 2.2  --  2.3 2.2  PHOS 3.5  --   --   --    GFR: Estimated Creatinine Clearance: 105.3 mL/min (by C-G formula based on SCr of 0.89 mg/dL). Liver Function Tests: No results for input(s): "AST", "ALT", "ALKPHOS", "BILITOT", "PROT", "ALBUMIN" in the last 168 hours.  No results for input(s): "LIPASE", "AMYLASE" in the last 168 hours. No results for input(s): "AMMONIA" in the last 168 hours. Coagulation Profile: No results for input(s): "INR", "PROTIME" in the last 168 hours. Cardiac Enzymes: No results for input(s): "CKTOTAL", "CKMB", "CKMBINDEX", "TROPONINI" in the last 168 hours. BNP (last 3 results) No results for input(s): "PROBNP" in the last 8760 hours. HbA1C: No results for input(s): "HGBA1C" in  the last 72 hours. CBG: No results for input(s): "GLUCAP" in the last 168 hours. Lipid Profile: No results for input(s): "CHOL", "HDL", "LDLCALC", "TRIG", "CHOLHDL", "LDLDIRECT" in the last 72 hours. Thyroid Function Tests: No results for input(s): "TSH", "T4TOTAL", "FREET4", "T3FREE", "THYROIDAB" in the last 72 hours. Anemia Panel: No results for input(s): "VITAMINB12", "FOLATE", "FERRITIN", "TIBC", "IRON", "RETICCTPCT" in the last 72 hours. Sepsis Labs: No results for input(s): "PROCALCITON", "LATICACIDVEN" in the last 168 hours.   Recent Results (from the past 240 hour(s))  SARS Coronavirus 2 by RT PCR (hospital order, performed in [facility] hospital lab) *cepheid single result test* Anterior Nasal Swab      Status: Abnormal   Collection Time: 06/08/22  3:28 PM   Specimen: Anterior Nasal Swab  Result Value Ref Range Status   SARS Coronavirus 2 by RT PCR POSITIVE (A) NEGATIVE Final    Comment: (NOTE) SARS-CoV-2 target nucleic acids are DETECTED  SARS-CoV-2 RNA is generally detectable in upper respiratory specimens  during the acute phase of infection.  Positive results are indicative  of the presence of the identified virus, but do not rule out bacterial infection or co-infection with other pathogens not detected by the test.  Clinical correlation with patient history and  other diagnostic information is necessary to determine patient infection status.  The expected result is negative.  Fact Sheet for Patients:   https://www.patel.info/   Fact Sheet for Healthcare Providers:   https://hall.com/    This test is not yet approved or cleared by the [place] FDA and  has been authorized for detection and/or diagnosis of SARS-CoV-2 by FDA under an Emergency Use Authorization (EUA).  This EUA will remain in effect (meaning this test can be used) for the duration of  the COVID-19 declaration under Section 564(b)(1)  of the Act, 21 U.S.C. section 360-bbb-3(b)(1), unless the authorization is terminated or revoked sooner.   Performed at [facility], [place] [address] [place], [place] [address]   Blood culture (routine x 2)     Status: None   Collection Time: 06/08/22  4:36 PM   Specimen: BLOOD  Result Value Ref Range Status   Specimen Description   Final    BLOOD PORTA CATH Performed at [facility] [address], [place], [place] [address]    Special Requests   Final    BOTTLES DRAWN AEROBIC AND ANAEROBIC Blood Culture adequate volume Performed at [facility] [address], [place], [place] [address]    Culture   Final    NO GROWTH 5 DAYS Performed at [facility],  [place] [address], [place], [place] [address]    Report Status 06/13/2022 FINAL  Final     Radiology Studies: No results found.  Scheduled Meds:  amLODipine  5 mg Oral Daily   divalproex  250 mg Oral BID   enoxaparin (LOVENOX) injection  70 mg Subcutaneous Q24H   gabapentin  300 mg Oral BID   lisinopril  10 mg Oral Daily   pantoprazole  40 mg Oral Daily   sodium chloride flush  10-40 mL Intracatheter Q12H   venlafaxine XR  75 mg Oral Q breakfast   Continuous Infusions:    LOS: 7 days    Flora Lipps, MD [facility] Available via Epic secure chat 7am-7pm After these hours, please refer to coverage provider listed on [email or web address] 06/16/2022, 12:34 PM

## 2022-06-17 DIAGNOSIS — G620 Drug-induced polyneuropathy: Secondary | ICD-10-CM | POA: Diagnosis not present

## 2022-06-17 DIAGNOSIS — Z7189 Other specified counseling: Secondary | ICD-10-CM | POA: Diagnosis not present

## 2022-06-17 DIAGNOSIS — G9341 Metabolic encephalopathy: Secondary | ICD-10-CM | POA: Diagnosis not present

## 2022-06-17 DIAGNOSIS — C50911 Malignant neoplasm of unspecified site of right female breast: Secondary | ICD-10-CM | POA: Diagnosis not present

## 2022-06-17 NOTE — Progress Notes (Signed)
PROGRESS NOTE    Monica Mccoy  LGX:[id number] DOB: Mar 08, 1966 DOA: 06/08/2022 PCP: Haydee Salter, MD    Brief Narrative:   Monica Mccoy is a 56 y.o. female with past medical history of right breast cancer status post brain mets in the past, history of bilateral mastectomy and chemotherapy with whole brain radiation currently on Herceptin,  seizure disorder, HTN, chronic pedal edema, depression presented to hospital with altered mental status with increased increased lethargy and decreased level of interaction over the last 1.5 weeks.  At baseline patient usually ambulates with the use of a walker.  She has some mild confusion at baseline but can usually participate in meaningful conversation.  In the ED, patient had elevated blood pressure.Labs showed a potassium of 3.4.  Valproic acid level was 31.  SARS-CoV-2 PCR was positive.  Urinalysis negative for UTI. Portable chest x-ray negative for infiltrate.  CT head and CT cervical spine was negative for acute findings. Unchanged compression deformity of the C6 vertebral body with up to approximately 50% loss of vertebral body height noted. CT abdomen/pelvis with contrast negative for acute findings but unchanged fibroids and tiny gallstones.  Patient was then considered for admission to hospital for altered mental status.    At this time, patient is stable and is awaiting for skilled nursing facility placement.  Assessment & Plan:  Principal Problem:   COVID-19 virus infection Active Problems:   Acute metabolic encephalopathy   Localization-related (focal) (partial) symptomatic epilepsy and epileptic syndromes with complex partial seizures, not intractable, without status epilepticus ([place])   Generalized weakness   Cancer of right breast metastatic to brain [facility])   Essential hypertension   Chemotherapy-induced neuropathy ([place])   Palliative care encounter   Goals of care, counseling/discussion   Counseling and coordination of care    Altered mental status   Obesity, Class III, BMI 40-49.9 (morbid obesity) ([place])   COVID-19 virus infection Chest x-ray without infiltrate.  Not hypoxic.  Completed molnupiravir '800mg'$  PO BID x 5 days.  Continue isolation precautions for 10 days from the date of initial diagnosis. blood cultures negative in 5 days.   Acute metabolic encephalopathy Improved at this time.  Patient had increased confusion from baseline secondary to COVID infection.  Patient is alert awake and Communicative answering appropriately but has impaired memory and orientation.  MRI of the brain without new findings.   Gabapentin started at a lower dose.    Hypokalemia: Improved, latest potassium of 3.6  Localization-related (focal) (partial) symptomatic epilepsy and epileptic syndromes with complex partial seizures, not intractable, without status epilepticus ([place]) No further seizure episodes.  Continue Depakote 250 mg twice daily   Cancer of right breast metastatic to brain [facility]) Follows with oncology, Dr. Lindi Adie.  Status post bilateral mastectomy 07/2012 followed by chemotherapy and radiation.  Found to have 2 frontal lobe tumors on 11/2013 and underwent resection followed with whole brain radiation and chemotherapy.  Currently on Herceptin continue therapy.  MRI of the brain on 04/12/2022 showed  stable brain MRI with no evidence for residual or recurrent intracranial metastatic disease.  Palliative care on board   Chemotherapy-induced neuropathy ([place]) Has been started on gabapentin at '300mg'$  PO BID.  Tramadol on hold   Essential hypertension Continue lisinopril, amlodipine  Generalized weakness debility, deconditioning Worsening generalized weakness.  At baseline uses walker.  PT, OT recommending skilled nursing facility placement at this time.  History of several falls  Morbid obesity. Body mass index is 51.45 kg/m.  Present on admission.  Would benefit from weight loss as outpatient.  DVT prophylaxis:   Lovenox  subcu    Code Status: Full Code  Family Communication:  I spoke with the patient's sister on the phone and updated her about the clinical condition of the patient on 06/15/2022.  Disposition Plan: Skilled nursing facility.  Status is: Inpatient  Remains inpatient appropriate because:  Pending SNF placement, will need 10-day quarantine prior to discharge per TOC until 06/18/2022.  Medically stable for disposition    Consultants:  Palliative care  Procedures:  none  Antimicrobials:  none   Subjective: Today, patient was seen and examined at bedside.  Denies any nausea vomiting fever chills or rigor.    Objective: Vitals:   06/16/22 0511 06/16/22 1325 06/16/22 2049 06/17/22 1249  BP: 117/88 112/85 (!) 114/56 90/65  Pulse: 68 79 76 78  Resp: '16 18 18 16  '$ Temp: 98.1 F (36.7 C) 98.2 F (36.8 C) 97.8 F (36.6 C) 98.3 F (36.8 C)  TempSrc: Oral Oral Oral Oral  SpO2: 99% 99% 99% 100%  Weight:      Height:        Intake/Output Summary (Last 24 hours) at 06/17/2022 1359 Last data filed at 06/17/2022 0900 Gross per 24 hour  Intake 720 ml  Output 1552 ml  Net -832 ml    Filed Weights   06/08/22 2035  Weight: (!) 144.6 kg    Examination: Body mass index is 51.45 kg/m.   General: Morbidly obese built, not in obvious distress, chronically ill HENT:   No scleral pallor or icterus noted. Oral mucosa is moist.  Chest:  Clear breath sounds.  Diminished breath sounds bilaterally. No crackles or wheezes.  CVS: S1 &S2 heard. No murmur.  Regular rate and rhythm. Abdomen: Soft, nontender, nondistended.  Bowel sounds are heard.   Extremities: No cyanosis, clubbing or edema.  Peripheral pulses are palpable. Psych: Alert, awake, impaired memory and orientation CNS:  No cranial nerve deficits.  Power equal in all extremities.   Skin: Warm and dry.  No rashes noted.   Data Reviewed: I have personally reviewed following labs and imaging studies  CBC: Recent Labs  Lab  06/11/22 0437 06/12/22 0544 06/13/22 0557  WBC 2.1* 2.4* 2.6*  HGB 12.9 12.0 12.0  HCT 41.4 37.5 38.7  MCV 89.8 89.5 90.4  PLT 199 193 347    Basic Metabolic Panel: Recent Labs  Lab 06/11/22 0437 06/12/22 0544 06/13/22 0557  NA 136 137 140  K 3.4* 3.8 3.6  CL 100 103 104  CO2 '27 27 29  '$ GLUCOSE 97 97 92  BUN 12 22* 21*  CREATININE 0.91 1.11* 0.89  CALCIUM 8.8* 8.8* 8.9  MG  --  2.3 2.2    GFR: Estimated Creatinine Clearance: 105.3 mL/min (by C-G formula based on SCr of 0.89 mg/dL). Liver Function Tests: No results for input(s): "AST", "ALT", "ALKPHOS", "BILITOT", "PROT", "ALBUMIN" in the last 168 hours.  No results for input(s): "LIPASE", "AMYLASE" in the last 168 hours. No results for input(s): "AMMONIA" in the last 168 hours. Coagulation Profile: No results for input(s): "INR", "PROTIME" in the last 168 hours. Cardiac Enzymes: No results for input(s): "CKTOTAL", "CKMB", "CKMBINDEX", "TROPONINI" in the last 168 hours. BNP (last 3 results) No results for input(s): "PROBNP" in the last 8760 hours. HbA1C: No results for input(s): "HGBA1C" in the last 72 hours. CBG: No results for input(s): "GLUCAP" in the last 168 hours. Lipid Profile: No results for input(s): "CHOL", "HDL", "LDLCALC", "TRIG", "CHOLHDL", "  LDLDIRECT" in the last 72 hours. Thyroid Function Tests: No results for input(s): "TSH", "T4TOTAL", "FREET4", "T3FREE", "THYROIDAB" in the last 72 hours. Anemia Panel: No results for input(s): "VITAMINB12", "FOLATE", "FERRITIN", "TIBC", "IRON", "RETICCTPCT" in the last 72 hours. Sepsis Labs: No results for input(s): "PROCALCITON", "LATICACIDVEN" in the last 168 hours.   Recent Results (from the past 240 hour(s))  SARS Coronavirus 2 by RT PCR (hospital order, performed in [facility] hospital lab) *cepheid single result test* Anterior Nasal Swab     Status: Abnormal   Collection Time: 06/08/22  3:28 PM   Specimen: Anterior Nasal Swab  Result Value Ref Range  Status   SARS Coronavirus 2 by RT PCR POSITIVE (A) NEGATIVE Final    Comment: (NOTE) SARS-CoV-2 target nucleic acids are DETECTED  SARS-CoV-2 RNA is generally detectable in upper respiratory specimens  during the acute phase of infection.  Positive results are indicative  of the presence of the identified virus, but do not rule out bacterial infection or co-infection with other pathogens not detected by the test.  Clinical correlation with patient history and  other diagnostic information is necessary to determine patient infection status.  The expected result is negative.  Fact Sheet for Patients:   https://www.patel.info/   Fact Sheet for Healthcare Providers:   https://hall.com/    This test is not yet approved or cleared by the [place] FDA and  has been authorized for detection and/or diagnosis of SARS-CoV-2 by FDA under an Emergency Use Authorization (EUA).  This EUA will remain in effect (meaning this test can be used) for the duration of  the COVID-19 declaration under Section 564(b)(1)  of the Act, 21 U.S.C. section 360-bbb-3(b)(1), unless the authorization is terminated or revoked sooner.   Performed at [facility], [place] [address] [place], [place] [address]   Blood culture (routine x 2)     Status: None   Collection Time: 06/08/22  4:36 PM   Specimen: BLOOD  Result Value Ref Range Status   Specimen Description   Final    BLOOD PORTA CATH Performed at [place] [address] [place], [place] [address]    Special Requests   Final    BOTTLES DRAWN AEROBIC AND ANAEROBIC Blood Culture adequate volume Performed at [place] [address], [place], [place] [address]    Culture   Final    NO GROWTH 5 DAYS Performed at [facility], [place] [address], [place], [place] [address]    Report Status 06/13/2022 FINAL  Final     Radiology Studies: No  results found.  Scheduled Meds:  amLODipine  5 mg Oral Daily   divalproex  250 mg Oral BID   enoxaparin (LOVENOX) injection  70 mg Subcutaneous Q24H   gabapentin  300 mg Oral BID   lisinopril  10 mg Oral Daily   pantoprazole  40 mg Oral Daily   sodium chloride flush  10-40 mL Intracatheter Q12H   venlafaxine XR  75 mg Oral Q breakfast   Continuous Infusions:    LOS: 8 days    Flora Lipps, MD [facility] Available via Epic secure chat 7am-7pm After these hours, please refer to coverage provider listed on [email or web address] 06/17/2022, 1:59 PM

## 2022-06-18 ENCOUNTER — Inpatient Hospital Stay: Payer: Medicare Other | Admitting: Hematology and Oncology

## 2022-06-18 ENCOUNTER — Inpatient Hospital Stay: Payer: Medicare Other

## 2022-06-18 ENCOUNTER — Ambulatory Visit: Payer: Medicare Other | Admitting: Hematology and Oncology

## 2022-06-18 ENCOUNTER — Ambulatory Visit: Payer: Medicare Other

## 2022-06-18 DIAGNOSIS — U071 COVID-19: Secondary | ICD-10-CM | POA: Diagnosis not present

## 2022-06-18 MED ORDER — TRAMADOL HCL 50 MG PO TABS: 50.0000 mg | ORAL_TABLET | Freq: Two times a day (BID) | ORAL | 0 refills | Status: AC

## 2022-06-18 NOTE — TOC Transition Note (Addendum)
Transition of Care [facility]) - CM/SW Discharge Note   Patient Details  Name: Monica Mccoy MRN: [id number] Date of Birth: 1965-09-02  Transition of [facility]) CM/SW Contact:  Ross Ludwig, LCSW Phone Number: 06/18/2022, 1:16 PM   Clinical Narrative:     CSW spoke to Kia at [facility], patient can discharge to facility today.  Patient to be d/c'ed today to [facility].  Patient and family agreeable to plans will transport via ems RN to call report to [phone].  CSW updated patient's sister [name] [phone] she is aware that patient will be discharging today.     Final next level of care: Skilled Nursing Facility Barriers to Discharge: Barriers Resolved   Patient Goals and CMS Choice Patient states their goals for this hospitalization and ongoing recovery are:: To go to SNF then return back home. CMS Medicare.gov Compare Post Acute Care list provided to:: Patient Represenative (must comment) Choice offered to / list presented to : Sibling  Discharge Placement PASRR number recieved: 06/11/22            Patient chooses bed at: [facility] Patient to be transferred to facility by: [facility] Name of family member notified: Berline Lopes Patient and family notified of of transfer: 06/18/22  Discharge Plan and Services In-house Referral: NA Discharge Planning Services: CM Consult Post Acute Care Choice: [place]          DME Arranged: N/A DME Agency: NA                  Social Determinants of Health ([place]) Interventions     Readmission Risk Interventions    06/11/2022   11:48 AM 06/10/2022   10:14 AM  Readmission Risk Prevention Plan  Transportation Screening Complete Complete  PCP or Specialist Appt within 5-7 Days Complete Complete  Home Care Screening Complete Complete  Medication Review (RN CM) Complete Complete

## 2022-06-18 NOTE — Discharge Summary (Signed)
Physician Discharge Summary  Monica Mccoy XTG:[id number] DOB: 07-04-1966 DOA: 06/08/2022  PCP: Haydee Salter, MD  Admit date: 06/08/2022 Discharge date: 06/18/2022  Admitted From: Home Disposition: Skilled nursing facility  Recommendations for Outpatient Follow-up:  Follow-up with oncology as determined  Home Health: N/A Equipment/Devices: N/A  Discharge Condition: Stable CODE STATUS: Full code Diet recommendation: Low-salt diet, supplements  Discharge summary: 56 y.o. female has breast cancer and brain mets, bilateral mastectomy and chemoradiation currently on Herceptin, history of seizure disorder, hypertension presented to the ER with confusion, lethargy and decreased interaction.   at baseline patient usually ambulates with the use of a walker.  In the ED, patient had elevated blood pressure.Labs showed a potassium of 3.4.  Valproic acid level was 31.  SARS-CoV-2 PCR was positive.  Urinalysis negative for UTI. Portable chest x-ray negative for infiltrate.  CT head and CT cervical spine was negative for acute findings. Unchanged compression deformity of the C6 vertebral body with up to approximately 50% loss of vertebral body height noted. CT abdomen/pelvis with contrast negative for acute findings but unchanged fibroids and tiny gallstones.  Patient was then considered for admission to hospital for altered mental status secondary to COVID-19 virus infection. Patient medically stabilized.  She remained in the hospital because a skilled nursing facility was not accepting admission 10 days until COVID-19 diagnosis.  Medically stable for discharge today.   Assessment & Plan:  Principal Problem:   COVID-19 virus infection Active Problems:   Acute metabolic encephalopathy   Localization-related (focal) (partial) symptomatic epilepsy and epileptic syndromes with complex partial seizures, not intractable, without status epilepticus ([place])   Generalized weakness   Cancer of right breast  metastatic to brain [facility])   Essential hypertension   Chemotherapy-induced neuropathy ([place])   Palliative care encounter   Goals of care, counseling/discussion   Counseling and coordination of care   Altered mental status   Obesity, Class III, BMI 40-49.9 (morbid obesity) ([place])   COVID-19 virus infection Chest x-ray without infiltrate.  Not hypoxic.  Completed molnupiravir '800mg'$  PO BID x 5 days.  She is off isolation now.    Acute metabolic encephalopathy Improved at this time.  Patient had increased confusion from baseline secondary to COVID infection.  Patient is alert awake and Communicative answering appropriately but has impaired memory and orientation.  MRI of the brain without new findings.    Localization-related (focal) (partial) symptomatic epilepsy and epileptic syndromes with complex partial seizures, not intractable, without status epilepticus ([place]) No further seizure episodes.  Continue Depakote 250 mg twice daily   Cancer of right breast metastatic to brain [facility]) Follows with oncology, Dr. Lindi Adie.  Status post bilateral mastectomy 07/2012 followed by chemotherapy and radiation.  Found to have 2 frontal lobe tumors on 11/2013 and underwent resection followed with whole brain radiation and chemotherapy.  Currently on Herceptin , continue therapy.  MRI of the brain on 04/12/2022 showed  stable brain MRI with no evidence for residual or recurrent intracranial metastatic disease.  Palliative care on board   Chemotherapy-induced neuropathy ([place]) Has been started on gabapentin at '300mg'$  PO BID. resume tramadol.   Essential hypertension Continue lisinopril, amlodipine   Generalized weakness debility, deconditioning Worsening generalized weakness.  At baseline uses walker.  PT, OT recommending skilled nursing facility placement at this time.  History of several falls   Medically stable to transfer to skilled level of care.    Discharge Diagnoses:  Principal Problem:   COVID-19  virus infection Active Problems:   Acute  metabolic encephalopathy   Localization-related (focal) (partial) symptomatic epilepsy and epileptic syndromes with complex partial seizures, not intractable, without status epilepticus ([place])   Generalized weakness   Cancer of right breast metastatic to brain [facility])   Essential hypertension   Chemotherapy-induced neuropathy ([place])   Palliative care encounter   Goals of care, counseling/discussion   Counseling and coordination of care   Altered mental status   Obesity, Class III, BMI 40-49.9 (morbid obesity) ([place])    Discharge Instructions  Discharge Instructions     Diet - low sodium heart healthy   Complete by: As directed    Increase activity slowly   Complete by: As directed       Allergies as of 06/18/2022       Reactions   Dilaudid [hydromorphone Hcl] Other (See Comments)   Confusion & hallucinations   Morphine And Related Other (See Comments)   Headaches        Medication List     STOP taking these medications    ketoconazole 2 % cream Commonly known as: NIZORAL   polyethylene glycol 17 g packet Commonly known as: MIRALAX / GLYCOLAX       TAKE these medications    amLODipine 5 MG tablet Commonly known as: NORVASC Take 5 mg by mouth daily.   diclofenac 50 MG EC tablet Commonly known as: VOLTAREN Take 1 tablet (50 mg total) by mouth 2 (two) times daily with a meal. Make sure eats or takes something before using this medicine What changed:  when to take this reasons to take this additional instructions   divalproex 250 MG DR tablet Commonly known as: DEPAKOTE Take 1 tablet (250 mg total) by mouth 2 (two) times daily.   docusate sodium 100 MG capsule Commonly known as: COLACE Take 200 mg by mouth daily as needed for mild constipation.   furosemide 20 MG tablet Commonly known as: LASIX Take 20 mg by mouth in the morning. What changed: Another medication with the same name was removed. Continue taking  this medication, and follow the directions you see here.   gabapentin 300 MG capsule Commonly known as: NEURONTIN Take 2 capsules (600 mg total) by mouth 2 (two) times daily. What changed:  how much to take when to take this additional instructions   ibuprofen 800 MG tablet Commonly known as: ADVIL Take 800 mg by mouth every 8 (eight) hours as needed for mild pain or headache.   lisinopril 10 MG tablet Commonly known as: ZESTRIL Take 1 tablet (10 mg total) by mouth daily.   MELATONIN GUMMIES PO Take 10 mg by mouth at bedtime as needed (for sleep).   mirabegron ER 50 MG Tb24 tablet Commonly known as: Myrbetriq Take 1 tablet (50 mg total) by mouth daily.   pantoprazole 40 MG tablet Commonly known as: PROTONIX Take 1 tablet (40 mg total) by mouth daily. What changed: when to take this   thiamine 100 MG tablet Commonly known as: VITAMIN B1 Take 1 tablet (100 mg total) by mouth daily.   traMADol 50 MG tablet Commonly known as: ULTRAM Take 1 tablet (50 mg total) by mouth in the morning and at bedtime for 5 days.   Tylenol 8 Hour Arthritis Pain 650 MG CR tablet Generic drug: acetaminophen Take 650 mg by mouth every 8 (eight) hours as needed for pain.   venlafaxine XR 75 MG 24 hr capsule Commonly known as: Effexor XR Take 1 capsule (75 mg total) by mouth daily with breakfast.  Contact information for after-discharge care     Destination     [facility] Preferred SNF .   Service: Skilled Nursing Contact information: [address] [address] [phone]                    Allergies  Allergen Reactions   Dilaudid [Hydromorphone Hcl] Other (See Comments)    Confusion & hallucinations   Morphine And Related Other (See Comments)    Headaches     Consultations: None   Procedures/Studies: MR BRAIN W WO CONTRAST  Result Date: 06/12/2022 CLINICAL DATA:  Initial evaluation for mental status change,  unknown cause. History of metastatic breast cancer. EXAM: MRI HEAD WITHOUT AND WITH CONTRAST TECHNIQUE: Multiplanar, multiecho pulse sequences of the brain and surrounding structures were obtained without and with intravenous contrast. CONTRAST:  103m GADAVIST GADOBUTROL 1 MMOL/ML IV SOLN COMPARISON:  Prior CT from 06/08/2022 as well as previous MRI from 01/29/2022. FINDINGS: Brain: Diffuse prominence of the CSF containing spaces compatible with generalized cerebral atrophy. Sequelae of prior left frontal craniotomy. Encephalomalacia and gliosis within the underlying left frontal lobe, stable. Additional confluent T2/FLAIR hyperintensity involving the supratentorial cerebral white matter could reflect chronic microvascular ischemic disease and/or post treatment changes (or a combination there of). Overall, appearance is stable from previous. No acute or subacute ischemia. No acute intracranial hemorrhage. Multiple scattered chronic micro hemorrhages noted, also stable from prior, and likely reflecting post treatment changes. No mass lesion, midline shift or mass effect. Mild ventricular prominence related to global parenchymal volume loss of hydrocephalus. No extra-axial fluid collection. Pituitary gland and suprasellar region within normal limits. Mild smooth postoperative dural enhancement noted overlying the left cerebral convexity. No other abnormal enhancement or evidence for new or recurrent metastatic disease. Vascular: Major intracranial vascular flow voids are maintained. Skull and upper cervical spine: Craniocervical junction within normal limits. Bone marrow signal intensity diffusely heterogeneous without worrisome osseous lesion. Prior left craniotomy. Scalp soft tissues demonstrate no acute finding. Sinuses/Orbits: Prior ocular lens replacement on the left. Globes orbital soft tissues demonstrate no acute finding. Mild scattered mucosal thickening noted about the ethmoidal air cells and maxillary  sinuses. Moderate right greater than left mastoid effusions, similar to prior, likely chronic. Visualized nasopharynx unremarkable. Other: None. IMPRESSION: 1. Stable brain MRI with no evidence for residual or recurrent intracranial metastatic disease. No other acute intracranial abnormality. 2. Chronic findings as detailed above, stable. Electronically Signed   By: BJeannine BogaM.D.   On: 06/12/2022 23:55   DG CHEST PORT 1 VIEW  Result Date: 06/09/2022 CLINICAL DATA:  Shortness of breath, cough EXAM: PORTABLE CHEST 1 VIEW COMPARISON:  06/08/2022 FINDINGS: Left chest port remains in place. Stable heart size. No focal airspace consolidation, pleural effusion, or pneumothorax. Surgical clips in the right axilla. IMPRESSION: No active disease. Electronically Signed   By: NDavina PokeD.O.   On: 06/09/2022 10:44   CT ABDOMEN PELVIS W CONTRAST  Result Date: 06/08/2022 CLINICAL DATA:  Addendum pain with urinary frequency. History of breast cancer. EXAM: CT ABDOMEN AND PELVIS WITH CONTRAST TECHNIQUE: Multidetector CT imaging of the abdomen and pelvis was performed using the standard protocol following bolus administration of intravenous contrast. RADIATION DOSE REDUCTION: This exam was performed according to the departmental dose-optimization program which includes automated exposure control, adjustment of the mA and/or kV according to patient size and/or use of iterative reconstruction technique. CONTRAST:  1094mOMNIPAQUE IOHEXOL 300 MG/ML  SOLN COMPARISON:  CT abdomen/pelvis 03/29/2022 FINDINGS:  Lower chest: The lung bases are clear. The imaged heart is unremarkable. Hepatobiliary: Liver is unremarkable. Tiny gallstones are again suspected without evidence of acute cholecystitis. There is no biliary ductal dilatation. Pancreas: Unremarkable. Spleen: Unremarkable. Adrenals/Urinary Tract: The adrenals are unremarkable. The kidneys are unremarkable, with no focal lesion, stone, hydronephrosis, or  hydroureter. There is symmetric excretion of contrast into the collecting systems on the delayed images. The bladder is unremarkable. Stomach/Bowel: The stomach is unremarkable. There is no evidence of bowel obstruction. There is no abnormal bowel wall thickening or inflammatory change. Postsurgical changes in the distal large bowel are stable. There is a moderate stool burden throughout the colon. The appendix is normal. Vascular/Lymphatic: The abdominal aorta is normal in course and caliber. The major branch vessels are patent. The main portal and splenic veins are patent. There is no abdominopelvic lymphadenopathy. Reproductive: The enlarged and multilobulated fibroid uterus is unchanged. There is no adnexal mass. Other: There is no ascites or free air. Musculoskeletal: There is no acute osseous abnormality or suspicious osseous lesion. IMPRESSION: 1. No acute finding in the abdomen or pelvis. 2. Moderate stool burden throughout the colon. 3. Unchanged fibroid uterus. 4. Probable tiny gallstones without evidence of acute cholecystitis. Electronically Signed   By: Valetta Mole M.D.   On: 06/08/2022 17:31   CT Head Wo Contrast  Result Date: 06/08/2022 CLINICAL DATA:  Altered mental status history of brain metastases from breast cancer EXAM: CT HEAD WITHOUT CONTRAST CT CERVICAL SPINE WITHOUT CONTRAST TECHNIQUE: Multidetector CT imaging of the head and cervical spine was performed following the standard protocol without intravenous contrast. Multiplanar CT image reconstructions of the cervical spine were also generated. RADIATION DOSE REDUCTION: This exam was performed according to the departmental dose-optimization program which includes automated exposure control, adjustment of the mA and/or kV according to patient size and/or use of iterative reconstruction technique. COMPARISON:  CT head 01/28/2022, brain MRI 01/29/2022 FINDINGS: CT HEAD FINDINGS Brain: There is no acute intracranial hemorrhage, extra-axial  fluid collection, or acute infarct. Background parenchymal volume is stable. Encephalomalacia in the left frontal lobe underlying a craniotomy defect is unchanged. The ventricles are stable in size with unchanged mild ex vacuo dilatation of the left lateral ventricle. Confluent hypodensity in the supratentorial white matter is unchanged and may reflect chronic small-vessel ischemic change and/or posttreatment change. Calcifications in the brainstem and left cerebellar hemisphere are unchanged. There is no mass lesion.  There is no mass effect or midline shift. Vascular: No hyperdense vessel or unexpected calcification. Skull: Status post left craniotomy. There is no suspicious osseous lesion or acute calvarial fracture. Sinuses/Orbits: Paranasal sinuses are clear. A left lens implant is in place. The globes and orbits are otherwise unremarkable. Other: Bilateral mastoid effusions are unchanged. CT CERVICAL SPINE FINDINGS Alignment: There is straightening of the normal cervical lordosis. There is no antero or retrolisthesis. There is no evidence of traumatic malalignment. Skull base and vertebrae: Skull base alignment is maintained. There is compression deformity of the C6 vertebral body with up to approximately 50% loss of vertebral body height centrally. There is no bony retropulsion. This was partially imaged on the CT chest/abdomen/pelvis from 10/20/2020 and is unchanged compared to the CT chest/abdomen/pelvis from 09/26/2021 when it was fully imaged. The other imaged vertebral body heights are preserved (note that the T1 vertebral body is not included within the field of view. Soft tissues and spinal canal: No prevertebral fluid or swelling. No visible canal hematoma. Disc levels: There is disc space narrowing and degenerative  endplate change most advanced at C5-C6 and eccentric to the left. There is no evidence of high-grade spinal canal stenosis. Upper chest: The lung apices are not included within the field  of view. A left chest wall port is partially imaged. Other: None. IMPRESSION: 1. Stable noncontrast head CT with no acute intracranial pathology. No evidence of metastatic disease by noncontrast CT. 2. No evidence of acute traumatic injury in the cervical spine. 3. Compression deformity of the C6 vertebral body with up to approximately 50% loss of vertebral body height is unchanged compared to the CT chest/abdomen/pelvis from 09/26/2021. Electronically Signed   By: Valetta Mole M.D.   On: 06/08/2022 17:23   CT Cervical Spine Wo Contrast  Result Date: 06/08/2022 CLINICAL DATA:  Altered mental status history of brain metastases from breast cancer EXAM: CT HEAD WITHOUT CONTRAST CT CERVICAL SPINE WITHOUT CONTRAST TECHNIQUE: Multidetector CT imaging of the head and cervical spine was performed following the standard protocol without intravenous contrast. Multiplanar CT image reconstructions of the cervical spine were also generated. RADIATION DOSE REDUCTION: This exam was performed according to the departmental dose-optimization program which includes automated exposure control, adjustment of the mA and/or kV according to patient size and/or use of iterative reconstruction technique. COMPARISON:  CT head 01/28/2022, brain MRI 01/29/2022 FINDINGS: CT HEAD FINDINGS Brain: There is no acute intracranial hemorrhage, extra-axial fluid collection, or acute infarct. Background parenchymal volume is stable. Encephalomalacia in the left frontal lobe underlying a craniotomy defect is unchanged. The ventricles are stable in size with unchanged mild ex vacuo dilatation of the left lateral ventricle. Confluent hypodensity in the supratentorial white matter is unchanged and may reflect chronic small-vessel ischemic change and/or posttreatment change. Calcifications in the brainstem and left cerebellar hemisphere are unchanged. There is no mass lesion.  There is no mass effect or midline shift. Vascular: No hyperdense vessel or  unexpected calcification. Skull: Status post left craniotomy. There is no suspicious osseous lesion or acute calvarial fracture. Sinuses/Orbits: Paranasal sinuses are clear. A left lens implant is in place. The globes and orbits are otherwise unremarkable. Other: Bilateral mastoid effusions are unchanged. CT CERVICAL SPINE FINDINGS Alignment: There is straightening of the normal cervical lordosis. There is no antero or retrolisthesis. There is no evidence of traumatic malalignment. Skull base and vertebrae: Skull base alignment is maintained. There is compression deformity of the C6 vertebral body with up to approximately 50% loss of vertebral body height centrally. There is no bony retropulsion. This was partially imaged on the CT chest/abdomen/pelvis from 10/20/2020 and is unchanged compared to the CT chest/abdomen/pelvis from 09/26/2021 when it was fully imaged. The other imaged vertebral body heights are preserved (note that the T1 vertebral body is not included within the field of view. Soft tissues and spinal canal: No prevertebral fluid or swelling. No visible canal hematoma. Disc levels: There is disc space narrowing and degenerative endplate change most advanced at C5-C6 and eccentric to the left. There is no evidence of high-grade spinal canal stenosis. Upper chest: The lung apices are not included within the field of view. A left chest wall port is partially imaged. Other: None. IMPRESSION: 1. Stable noncontrast head CT with no acute intracranial pathology. No evidence of metastatic disease by noncontrast CT. 2. No evidence of acute traumatic injury in the cervical spine. 3. Compression deformity of the C6 vertebral body with up to approximately 50% loss of vertebral body height is unchanged compared to the CT chest/abdomen/pelvis from 09/26/2021. Electronically Signed   By: Collier Salina  Noone M.D.   On: 06/08/2022 17:23   DG Chest Port 1 View  Result Date: 06/08/2022 CLINICAL DATA:  Cough altered mental  status EXAM: PORTABLE CHEST 1 VIEW COMPARISON:  03/20/2022 FINDINGS: Left-sided central venous port tip over the SVC. Low lung volumes. No acute airspace disease or effusion. Borderline enlargement of cardiomediastinal silhouette, likely augmented by low lung volume. Clips in the right axilla. IMPRESSION: No active disease. Electronically Signed   By: Donavan Foil M.D.   On: 06/08/2022 15:22   (Echo, Carotid, EGD, Colonoscopy, ERCP)    Subjective: Patient seen and examined.  Denies any complaints.  She is agreeable to go to rehab.   Discharge Exam: Vitals:   06/18/22 0349 06/18/22 0810  BP: 94/71 118/83  Pulse: 70 70  Resp: 18 20  Temp: 98.1 F (36.7 C) 98.1 F (36.7 C)  SpO2: 97% 100%   Vitals:   06/17/22 1249 06/17/22 2051 06/18/22 0349 06/18/22 0810  BP: '90/65 93/77 94/71 '$ 118/83  Pulse: 78 87 70 70  Resp: '16 20 18 20  '$ Temp: 98.3 F (36.8 C) 98.6 F (37 C) 98.1 F (36.7 C) 98.1 F (36.7 C)  TempSrc: Oral  Oral Oral  SpO2: 100% 100% 97% 100%  Weight:      Height:        General: Pt is alert, awake, not in acute distress Looks fairly comfortable.  Alert awake and oriented. Cardiovascular: RRR, S1/S2 +, no rubs, no gallops Respiratory: CTA bilaterally, no wheezing, no rhonchi Abdominal: Soft, NT, ND, bowel sounds + Extremities: no edema, no cyanosis    The results of significant diagnostics from this hospitalization (including imaging, microbiology, ancillary and laboratory) are listed below for reference.     Microbiology: Recent Results (from the past 240 hour(s))  SARS Coronavirus 2 by RT PCR (hospital order, performed in [facility] hospital lab) *cepheid single result test* Anterior Nasal Swab     Status: Abnormal   Collection Time: 06/08/22  3:28 PM   Specimen: Anterior Nasal Swab  Result Value Ref Range Status   SARS Coronavirus 2 by RT PCR POSITIVE (A) NEGATIVE Final    Comment: (NOTE) SARS-CoV-2 target nucleic acids are DETECTED  SARS-CoV-2 RNA is  generally detectable in upper respiratory specimens  during the acute phase of infection.  Positive results are indicative  of the presence of the identified virus, but do not rule out bacterial infection or co-infection with other pathogens not detected by the test.  Clinical correlation with patient history and  other diagnostic information is necessary to determine patient infection status.  The expected result is negative.  Fact Sheet for Patients:   https://www.patel.info/   Fact Sheet for Healthcare Providers:   https://hall.com/    This test is not yet approved or cleared by the [place] FDA and  has been authorized for detection and/or diagnosis of SARS-CoV-2 by FDA under an Emergency Use Authorization (EUA).  This EUA will remain in effect (meaning this test can be used) for the duration of  the COVID-19 declaration under Section 564(b)(1)  of the Act, 21 U.S.C. section 360-bbb-3(b)(1), unless the authorization is terminated or revoked sooner.   Performed at [facility], [place] [address], [place], [place] [address]   Blood culture (routine x 2)     Status: None   Collection Time: 06/08/22  4:36 PM   Specimen: BLOOD  Result Value Ref Range Status   Specimen Description   Final    BLOOD PORTA CATH Performed  at [facility], [place] [address], [place], [place] [address]    Special Requests   Final    BOTTLES DRAWN AEROBIC AND ANAEROBIC Blood Culture adequate volume Performed at [place] [address] [place], [place] [address]    Culture   Final    NO GROWTH 5 DAYS Performed at [facility], [place] [address], [place], [place] [address]    Report Status 06/13/2022 FINAL  Final     Labs: BNP (last 3 results) No results for input(s): "BNP" in the last 8760 hours. Basic Metabolic Panel: Recent Labs  Lab 06/12/22 0544 06/13/22 0557  NA 137 140  K 3.8  3.6  CL 103 104  CO2 27 29  GLUCOSE 97 92  BUN 22* 21*  CREATININE 1.11* 0.89  CALCIUM 8.8* 8.9  MG 2.3 2.2   Liver Function Tests: No results for input(s): "AST", "ALT", "ALKPHOS", "BILITOT", "PROT", "ALBUMIN" in the last 168 hours. No results for input(s): "LIPASE", "AMYLASE" in the last 168 hours. No results for input(s): "AMMONIA" in the last 168 hours. CBC: Recent Labs  Lab 06/12/22 0544 06/13/22 0557  WBC 2.4* 2.6*  HGB 12.0 12.0  HCT 37.5 38.7  MCV 89.5 90.4  PLT 193 206   Cardiac Enzymes: No results for input(s): "CKTOTAL", "CKMB", "CKMBINDEX", "TROPONINI" in the last 168 hours. BNP: Invalid input(s): "POCBNP" CBG: No results for input(s): "GLUCAP" in the last 168 hours. D-Dimer No results for input(s): "DDIMER" in the last 72 hours. Hgb A1c No results for input(s): "HGBA1C" in the last 72 hours. Lipid Profile No results for input(s): "CHOL", "HDL", "LDLCALC", "TRIG", "CHOLHDL", "LDLDIRECT" in the last 72 hours. Thyroid function studies No results for input(s): "TSH", "T4TOTAL", "T3FREE", "THYROIDAB" in the last 72 hours.  Invalid input(s): "FREET3" Anemia work up No results for input(s): "VITAMINB12", "FOLATE", "FERRITIN", "TIBC", "IRON", "RETICCTPCT" in the last 72 hours. Urinalysis    Component Value Date/Time   COLORURINE STRAW (A) 06/08/2022 1646   APPEARANCEUR CLEAR 06/08/2022 1646   LABSPEC 1.010 06/08/2022 1646   PHURINE 8.0 06/08/2022 1646   GLUCOSEU NEGATIVE 06/08/2022 1646   HGBUR NEGATIVE 06/08/2022 1646   BILIRUBINUR NEGATIVE 06/08/2022 1646   BILIRUBINUR negative 03/09/2022 1332   KETONESUR NEGATIVE 06/08/2022 1646   PROTEINUR NEGATIVE 06/08/2022 1646   UROBILINOGEN 0.2 03/09/2022 1332   NITRITE NEGATIVE 06/08/2022 [place] 06/08/2022 1646   Sepsis Labs Recent Labs  Lab 06/12/22 0544 06/13/22 0557  WBC 2.4* 2.6*   Microbiology Recent Results (from the past 240 hour(s))  SARS Coronavirus 2 by RT PCR (hospital  order, performed in [place] hospital lab) *cepheid single result test* Anterior Nasal Swab     Status: Abnormal   Collection Time: 06/08/22  3:28 PM   Specimen: Anterior Nasal Swab  Result Value Ref Range Status   SARS Coronavirus 2 by RT PCR POSITIVE (A) NEGATIVE Final    Comment: (NOTE) SARS-CoV-2 target nucleic acids are DETECTED  SARS-CoV-2 RNA is generally detectable in upper respiratory specimens  during the acute phase of infection.  Positive results are indicative  of the presence of the identified virus, but do not rule out bacterial infection or co-infection with other pathogens not detected by the test.  Clinical correlation with patient history and  other diagnostic information is necessary to determine patient infection status.  The expected result is negative.  Fact Sheet for Patients:   https://www.patel.info/   Fact Sheet for Healthcare Providers:  https://hall.com/    This test is not yet approved or cleared by the [place] and  has been authorized for detection and/or diagnosis of SARS-CoV-2 by FDA under an Emergency Use Authorization (EUA).  This EUA will remain in effect (meaning this test can be used) for the duration of  the COVID-19 declaration under Section 564(b)(1)  of the Act, 21 U.S.C. section 360-bbb-3(b)(1), unless the authorization is terminated or revoked sooner.   Performed at [facility], [place] [address] [place], [place] [address]   Blood culture (routine x 2)     Status: None   Collection Time: 06/08/22  4:36 PM   Specimen: BLOOD  Result Value Ref Range Status   Specimen Description   Final    BLOOD PORTA CATH Performed at [facility] [address] [place], [place] [address]    Special Requests   Final    BOTTLES DRAWN AEROBIC AND ANAEROBIC Blood Culture adequate volume Performed at [facility] [address]  [place], [place] [address]    Culture   Final    NO GROWTH 5 DAYS Performed at [facility], [place] [address] [place], [place] [address]    Report Status 06/13/2022 FINAL  Final     Time coordinating discharge: 35 minutes  SIGNED:   Barb Merino, MD  [facility] 06/18/2022, 11:06 AM

## 2022-06-18 NOTE — Progress Notes (Signed)
Report called to [facility] for pt. Going to room 128B.  Sherrelle, LPN given report and all questions answered

## 2022-06-19 ENCOUNTER — Telehealth: Payer: Self-pay

## 2022-06-19 NOTE — Telephone Encounter (Signed)
Transition Care Management Follow-up Telephone Call Date of discharge and from where: 06/18/22 Dx: Altered Mental Status. [facility] Inpatient. How have you been since you were released from the hospital? She's doing better Any questions or concerns? No  Items Reviewed: Did the pt receive and understand the discharge instructions provided?  Pt's sister is not aware if there was DC paperwork/instructions provided. Medications obtained and verified? No  Other? No  Any new allergies since your discharge? No  Dietary orders reviewed? No Do you have support at home? Yes   Home Care and Equipment/Supplies: Were home health services ordered? not applicable If so, what is the name of the agency? N/a  Has the agency set up a time to come to the patient's home? not applicable Were any new equipment or medical supplies ordered?  No What is the name of the medical supply agency? N/a Were you able to get the supplies/equipment? not applicable Do you have any questions related to the use of the equipment or supplies? No  Functional Questionnaire: (I = Independent and D = Dependent) ADLs: D  Bathing/Dressing- D  Meal Prep- D  Eating- D  Maintaining continence- D  Transferring/Ambulation- D  Managing Meds- D  Follow up appointments reviewed:  PCP Hospital f/u appt confirmed? Yes  Scheduled to see Dr. Gena Fray on 07/02/22 @ 10:40am. [facility] f/u appt confirmed?  N/a  Scheduled to see n/a on n/a @ n/a. Are transportation arrangements needed? No  If their condition worsens, is the pt aware to call PCP or go to the Emergency Dept.? Yes Was the patient provided with contact information for the PCP's office or ED? Yes Was to pt encouraged to call back with questions or concerns? Yes  Angeline Slim, RN, BSN RN Clinical Supervisor [facility] [employer]

## 2022-06-21 ENCOUNTER — Other Ambulatory Visit: Payer: Self-pay | Admitting: *Deleted

## 2022-06-21 NOTE — Patient Outreach (Signed)
Late entry for 06/20/22. [name] recently admitted to [facility]. Screening for potential [facility] care coordination services as benefit of insurance plan and PCP.   Facility site visit to [facility] on 06/20/22. Met with SNF social worker and therapy manager to make aware writer is following for potential [facility]N needs.  Will follow.    Marthenia Rolling, MSN, RN,BSN [place] Acute Care Coordinator [phone] (Direct dial)

## 2022-06-24 NOTE — Progress Notes (Signed)
Patient Care Team: Haydee Salter, MD as PCP - General (Family Medicine) Nicholas Lose, MD as Consulting Physician (Hematology and Oncology) Cameron Sprang, MD as Consulting Physician (Neurology) Pickenpack-Cousar, Carlena Sax, NP as Nurse Practitioner (Nurse Practitioner)  DIAGNOSIS:  Encounter Diagnosis  Name Primary?   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[place]) Yes    SUMMARY OF ONCOLOGIC HISTORY: Oncology History  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[place])  06/13/2012 Initial Diagnosis   Right breast biopsy: Invasive ductal carcinoma, grade 3, HER-2 positive (3+), ER/PR negative, Ki67 5-10%, and in the right breast, high grade DCIS. BRCA gene testing was negative   08/06/2012 Surgery   Bilateral mastectomies:  Right breast: Invasive ductal carcinoma, 0.7cm, grade 2, with extensive DCIS, clear margins, 8/11 lymph nodes positive, measuring up to 3.8cm,  Left breast, no evidence of malignancy and 6 lymph nodes negative for carcinoma.    2014 - 2015 Chemotherapy   adjuvant chemotherapy with 4 cycles of dose dense Adriamycin and Cytoxan followed by 4 cycles of Taxol Herceptin and then Herceptin maintenance. She underwent right chest radiation.       12/24/2013 Relapse/Recurrence   Dizziness, headaches, ataxia, and memory loss. Head CT showed a 1.8cm and a 2.0cm frontal lobe masses. She underwent resection of both masses on 01/07/14,: Moderately differentiated carcinoma, likely of breast origin, HER-2 positive, ER/PR negative, CK7 positive.   2015 - 2015 Radiation Therapy   Received whole brain radiation    Chemotherapy   Taxotere, Herceptin and Perjeta, but Taxotere was discontinued after 4 cycles       11/21/2020 - 04/03/2022 Chemotherapy   Patient is on Treatment Plan : BREAST Trastuzumab q21d     02/09/2022 Cancer Staging   Staging form: Breast, AJCC 8th Edition - Pathologic: Stage IV (pM1) - Signed by Gardenia Phlegm, NP on 02/09/2022   05/07/2022 -   Chemotherapy   Patient is on Treatment Plan : BREAST Trastuzumab IV (8/6) or SQ (600) D1 q21d       CHIEF COMPLIANT: Follow-up on Herceptin therapy    INTERVAL HISTORY: Monica Mccoy is a 56 y.o. with above-mentioned history of metastatic breast cancer who is currently on Herceptin maintenance therapy. She presents to the clinic today for follow-up.  She is in a wheelchair.  She is currently in a nursing home and getting rehab.  She was recently in the hospital with COVID-19 infection and mental status changes all of which have improved.   ALLERGIES:  is allergic to dilaudid [hydromorphone hcl] and morphine and related.  MEDICATIONS:  Current Outpatient Medications  Medication Sig Dispense Refill   amLODipine (NORVASC) 5 MG tablet Take 5 mg by mouth daily.     diclofenac (VOLTAREN) 50 MG EC tablet Take 1 tablet (50 mg total) by mouth 2 (two) times daily with a meal. Make sure eats or takes something before using this medicine (Patient taking differently: Take 50 mg by mouth 2 (two) times daily as needed for mild pain (BEFORE A MEAL, IF TAKING).) 30 tablet 1   divalproex (DEPAKOTE) 250 MG DR tablet Take 1 tablet (250 mg total) by mouth 2 (two) times daily. 60 tablet 1   docusate sodium (COLACE) 100 MG capsule Take 200 mg by mouth daily as needed for mild constipation.     furosemide (LASIX) 20 MG tablet Take 20 mg by mouth in the morning.     gabapentin (NEURONTIN) 300 MG capsule Take 2 capsules (600 mg total) by mouth 2 (two)  times daily. (Patient taking differently: Take 300-600 mg by mouth See admin instructions. Take 300 mg by mouth in the morning, 300 mg at 6 PM, and 600 mg at bedtime) 360 capsule 3   ibuprofen (ADVIL) 800 MG tablet Take 800 mg by mouth every 8 (eight) hours as needed for mild pain or headache.     lisinopril (ZESTRIL) 10 MG tablet Take 1 tablet (10 mg total) by mouth daily. 90 tablet 3   MELATONIN GUMMIES PO Take 10 mg by mouth at bedtime as needed (for sleep).      mirabegron ER (MYRBETRIQ) 50 MG TB24 tablet Take 1 tablet (50 mg total) by mouth daily. 90 tablet 1   pantoprazole (PROTONIX) 40 MG tablet Take 1 tablet (40 mg total) by mouth daily. (Patient taking differently: Take 40 mg by mouth daily before breakfast.) 90 tablet 3   thiamine 100 MG tablet Take 1 tablet (100 mg total) by mouth daily. 30 tablet 11   TYLENOL 8 HOUR ARTHRITIS PAIN 650 MG CR tablet Take 650 mg by mouth every 8 (eight) hours as needed for pain.     venlafaxine XR (EFFEXOR XR) 75 MG 24 hr capsule Take 1 capsule (75 mg total) by mouth daily with breakfast. 30 capsule 5   No current facility-administered medications for this visit.    PHYSICAL EXAMINATION: ECOG PERFORMANCE STATUS: 1 - Symptomatic but completely ambulatory  Vitals:   06/29/22 0956  BP: 123/71  Pulse: 74  Resp: 18  Temp: 97.9 F (36.6 C)  SpO2: 99%   Filed Weights   06/29/22 0956  Weight: (!) 317 lb (143.8 kg)     LABORATORY DATA:  I have reviewed the data as listed    Latest Ref Rng & Units 06/29/2022    9:40 AM 06/13/2022    5:57 AM 06/12/2022    5:44 AM  CMP  Glucose 70 - 99 mg/dL 76  92  97   BUN 6 - 20 mg/dL _0 Creatinine 0.44 - 1.00 mg/dL 0.92  0.89  1.11   Sodium 135 - 145 mmol/L 141  140  137   Potassium 3.5 - 5.1 mmol/L 4.0  3.6  3.8   Chloride 98 - 111 mmol/L 106  104  103   CO2 22 - 32 mmol/L _1 Calcium 8.9 - 10.3 mg/dL 9.4  8.9  8.8   Total Protein 6.5 - 8.1 g/dL 7.4     Total Bilirubin 0.3 - 1.2 mg/dL 0.7     Alkaline Phos 38 - 126 U/L 67     AST 15 - 41 U/L 10     ALT 0 - 44 U/L 9       Lab Results  Component Value Date   WBC 3.5 (L) 06/29/2022   HGB 11.5 (L) 06/29/2022   HCT 36.7 06/29/2022   MCV 90.6 06/29/2022   PLT 153 06/29/2022   NEUTROABS 1.8 06/29/2022    ASSESSMENT & PLAN:  Cancer of right breast metastatic to brain [facility]) Moved to [facility] from [place], [facility]), in 02/2020 to live with her parents and son  near her sister. (2013 HER2 positive breast cancer status post bilateral mastectomies and adjuvant chemotherapy with Herceptin, April 2015 brain metastases status post resection and radiation)   Prior treatment: Patient was on Herceptin and Perjeta maintenance until June 2021 Because of insurance issues she has not had treatment for 7 months until she  moved to [place].   Current treatment: Herceptin maintenance every 4 weeks started 11/21/2020 Herceptin toxicities: None   Brain MRI 09/26/2020 (confusion as well as frequent falls.):  No intracranial abnormalities Brain MRI 06/12/2022: No change    CT CAP 03/29/2022: Stable examination without any progression.  Cholelithiasis. CT abdomen and pelvis 06/08/2022: No metastatic disease   hospitalization 01/28/2022-02/02/2022: Seizures: Recommendation of taking higher doses of gabapentin along with Depakote Hospitalization 06/08/2022-06/18/2022: COVID-19 related mental status changes  Continue with monthly Herceptin treatments Follow-up with me in 3 months    No orders of the defined types were placed in this encounter.  The patient has a good understanding of the overall plan. she agrees with it. she will call with any problems that may develop before the next visit here. Total time spent: 30 mins including face to face time and time spent for planning, charting and co-ordination of care   Harriette Ohara, MD 06/29/22    I Gardiner Coins am scribing for Dr. Lindi Adie  I have reviewed the above documentation for accuracy and completeness, and I agree with the above.

## 2022-06-29 ENCOUNTER — Inpatient Hospital Stay: Payer: Medicare Other

## 2022-06-29 ENCOUNTER — Inpatient Hospital Stay (HOSPITAL_BASED_OUTPATIENT_CLINIC_OR_DEPARTMENT_OTHER): Payer: Medicare Other | Admitting: Hematology and Oncology

## 2022-06-29 ENCOUNTER — Other Ambulatory Visit: Payer: Self-pay

## 2022-06-29 ENCOUNTER — Inpatient Hospital Stay: Payer: Medicare Other | Attending: Internal Medicine

## 2022-06-29 VITALS — BP 123/71 | HR 74 | Temp 97.9°F | Resp 18 | Ht 66.0 in | Wt 317.0 lb

## 2022-06-29 DIAGNOSIS — C50911 Malignant neoplasm of unspecified site of right female breast: Secondary | ICD-10-CM

## 2022-06-29 DIAGNOSIS — C7931 Secondary malignant neoplasm of brain: Secondary | ICD-10-CM

## 2022-06-29 DIAGNOSIS — Z95828 Presence of other vascular implants and grafts: Secondary | ICD-10-CM

## 2022-06-29 DIAGNOSIS — Z171 Estrogen receptor negative status [ER-]: Secondary | ICD-10-CM | POA: Insufficient documentation

## 2022-06-29 DIAGNOSIS — Z5112 Encounter for antineoplastic immunotherapy: Secondary | ICD-10-CM | POA: Insufficient documentation

## 2022-06-29 DIAGNOSIS — Z9013 Acquired absence of bilateral breasts and nipples: Secondary | ICD-10-CM | POA: Diagnosis not present

## 2022-06-29 DIAGNOSIS — Z79899 Other long term (current) drug therapy: Secondary | ICD-10-CM | POA: Diagnosis not present

## 2022-06-29 LAB — CBC WITH DIFFERENTIAL (CANCER CENTER ONLY)
Abs Immature Granulocytes: 0.01 10*3/uL (ref 0.00–0.07)
Basophils Absolute: 0 10*3/uL (ref 0.0–0.1)
Basophils Relative: 1 %
Eosinophils Absolute: 0.1 10*3/uL (ref 0.0–0.5)
Eosinophils Relative: 4 %
HCT: 36.7 % (ref 36.0–46.0)
Hemoglobin: 11.5 g/dL — ABNORMAL LOW (ref 12.0–15.0)
Immature Granulocytes: 0 %
Lymphocytes Relative: 25 %
Lymphs Abs: 0.9 10*3/uL (ref 0.7–4.0)
MCH: 28.4 pg (ref 26.0–34.0)
MCHC: 31.3 g/dL (ref 30.0–36.0)
MCV: 90.6 fL (ref 80.0–100.0)
Monocytes Absolute: 0.6 10*3/uL (ref 0.1–1.0)
Monocytes Relative: 18 %
Neutro Abs: 1.8 10*3/uL (ref 1.7–7.7)
Neutrophils Relative %: 52 %
Platelet Count: 153 10*3/uL (ref 150–400)
RBC: 4.05 MIL/uL (ref 3.87–5.11)
RDW: 14.6 % (ref 11.5–15.5)
WBC Count: 3.5 10*3/uL — ABNORMAL LOW (ref 4.0–10.5)
nRBC: 0 % (ref 0.0–0.2)

## 2022-06-29 LAB — CMP (CANCER CENTER ONLY)
ALT: 9 U/L (ref 0–44)
AST: 10 U/L — ABNORMAL LOW (ref 15–41)
Albumin: 3.8 g/dL (ref 3.5–5.0)
Alkaline Phosphatase: 67 U/L (ref 38–126)
Anion gap: 5 (ref 5–15)
BUN: 14 mg/dL (ref 6–20)
CO2: 30 mmol/L (ref 22–32)
Calcium: 9.4 mg/dL (ref 8.9–10.3)
Chloride: 106 mmol/L (ref 98–111)
Creatinine: 0.92 mg/dL (ref 0.44–1.00)
GFR, Estimated: >60 mL/min (ref >60)
Glucose, Bld: 76 mg/dL (ref 70–99)
Potassium: 4 mmol/L (ref 3.5–5.1)
Sodium: 141 mmol/L (ref 135–145)
Total Bilirubin: 0.7 mg/dL (ref 0.3–1.2)
Total Protein: 7.4 g/dL (ref 6.5–8.1)

## 2022-06-29 MED ORDER — HEPARIN SOD (PORK) LOCK FLUSH 100 UNIT/ML IV SOLN
500.0000 [IU] | Freq: Once | INTRAVENOUS | Status: AC | PRN
  Administered 2022-06-29: 500 [IU]

## 2022-06-29 MED ORDER — DIPHENHYDRAMINE HCL 25 MG PO CAPS
50.0000 mg | ORAL_CAPSULE | Freq: Once | ORAL | Status: AC
  Administered 2022-06-29: 50 mg via ORAL
  Filled 2022-06-29: qty 2

## 2022-06-29 MED ORDER — SODIUM CHLORIDE 0.9% FLUSH
10.0000 mL | INTRAVENOUS | Status: DC | PRN
  Administered 2022-06-29: 10 mL

## 2022-06-29 MED ORDER — SODIUM CHLORIDE 0.9 % IV SOLN: Freq: Once | INTRAVENOUS | Status: AC

## 2022-06-29 MED ORDER — TRASTUZUMAB-DKST CHEMO 150 MG IV SOLR
6.0000 mg/kg | Freq: Once | INTRAVENOUS | Status: AC
  Administered 2022-06-29: 882 mg via INTRAVENOUS
  Filled 2022-06-29: qty 42

## 2022-06-29 MED ORDER — ALTEPLASE 2 MG IJ SOLR
2.0000 mg | Freq: Once | INTRAMUSCULAR | Status: AC | PRN
  Administered 2022-06-29: 2 mg
  Filled 2022-06-29: qty 2

## 2022-06-29 MED ORDER — SODIUM CHLORIDE 0.9% FLUSH: 10.0000 mL | INTRAVENOUS | Status: DC | PRN

## 2022-06-29 MED ORDER — ACETAMINOPHEN 325 MG PO TABS
650.0000 mg | ORAL_TABLET | Freq: Once | ORAL | Status: AC
  Administered 2022-06-29: 650 mg via ORAL
  Filled 2022-06-29: qty 2

## 2022-06-29 NOTE — Progress Notes (Signed)
Echo every 6 months per Suzan Garibaldi, NP note 02/09/22

## 2022-06-29 NOTE — Patient Instructions (Signed)
[facility]  Discharge Instructions: Thank you for choosing [facility] to provide your oncology and hematology care.   If you have a lab appointment with the Cancer Center, please go directly to the Cancer Center and check in at the registration area.   Wear comfortable clothing and clothing appropriate for easy access to any Portacath or PICC line.   We strive to give you quality time with your provider. You may need to reschedule your appointment if you arrive late (15 or more minutes).  Arriving late affects you and other patients whose appointments are after yours.  Also, if you miss three or more appointments without notifying the office, you may be dismissed from the clinic at the provider's discretion.      For prescription refill requests, have your pharmacy contact our office and allow 72 hours for refills to be completed.    Today you received the following chemotherapy and/or immunotherapy agents: Trastuzumab.       To help prevent nausea and vomiting after your treatment, we encourage you to take your nausea medication as directed.  BELOW ARE SYMPTOMS THAT SHOULD BE REPORTED IMMEDIATELY: *FEVER GREATER THAN 100.4 F (38 C) OR HIGHER *CHILLS OR SWEATING *NAUSEA AND VOMITING THAT IS NOT CONTROLLED WITH YOUR NAUSEA MEDICATION *UNUSUAL SHORTNESS OF BREATH *UNUSUAL BRUISING OR BLEEDING *URINARY PROBLEMS (pain or burning when urinating, or frequent urination) *BOWEL PROBLEMS (unusual diarrhea, constipation, pain near the anus) TENDERNESS IN MOUTH AND THROAT WITH OR WITHOUT PRESENCE OF ULCERS (sore throat, sores in mouth, or a toothache) UNUSUAL RASH, SWELLING OR PAIN  UNUSUAL VAGINAL DISCHARGE OR ITCHING   Items with * indicate a potential emergency and should be followed up as soon as possible or go to the Emergency Department if any problems should occur.  Please show the CHEMOTHERAPY ALERT CARD or IMMUNOTHERAPY ALERT CARD at check-in  to the Emergency Department and triage nurse.  Should you have questions after your visit or need to cancel or reschedule your appointment, please contact [facility] MEDICAL ONCOLOGY  Dept: [phone]  and follow the prompts.  Office hours are 8:00 a.m. to 4:30 p.m. Monday - Friday. Please note that voicemails left after 4:00 p.m. may not be returned until the following business day.  We are closed weekends and major holidays. You have access to a nurse at all times for urgent questions. Please call the main number to the clinic Dept: [phone] and follow the prompts.   For any non-urgent questions, you may also contact your provider using MyChart. We now offer e-Visits for anyone 18 and older to request care online for non-urgent symptoms. For details visit [email or web address].   Also download the MyChart app! Go to the app store, search "MyChart", open the app, select [facility], and log in with your MyChart username and password.  Masks are optional in the cancer centers. If you would like for your care team to wear a mask while they are taking care of you, please let them know. You may have one support person who is at least 55 years old accompany you for your appointments. 

## 2022-06-29 NOTE — Assessment & Plan Note (Signed)
Moved to [place] from [place], [facility]), in 02/2020 to live with her parents and son near her sister. (2013 HER2 positive breast cancer status post bilateral mastectomies and adjuvant chemotherapy with Herceptin, April 2015 brain metastases status post resection and radiation)   Prior treatment: Patient was on Herceptin and Perjeta maintenance until June 2021 Because of insurance issues she has not had treatment for 7 months until she moved to [place].   Current treatment: Herceptin maintenance every 4 weeks started 11/21/2020 Herceptin toxicities: None   Brain MRI 09/26/2020 (confusion as well as frequent falls.):  No intracranial abnormalities Brain MRI 06/12/2022: No change    CT CAP 03/29/2022: Stable examination without any progression.  Cholelithiasis. CT abdomen and pelvis 06/08/2022: No metastatic disease   hospitalization 01/28/2022-02/02/2022: Seizures: Recommendation of taking higher doses of gabapentin along with Depakote Hospitalization 06/08/2022-06/18/2022: COVID-19 related mental status changes  Continue with monthly Herceptin treatments Follow-up with me in 3 months

## 2022-07-02 ENCOUNTER — Inpatient Hospital Stay: Payer: Medicare Other | Admitting: Family Medicine

## 2022-07-02 ENCOUNTER — Telehealth: Payer: Self-pay | Admitting: Family Medicine

## 2022-07-02 NOTE — Telephone Encounter (Signed)
Pt was a same day cancellation 11/06 for a HFU with Dr. Gena Fray. Called in to [place]. This is her third

## 2022-07-02 NOTE — Telephone Encounter (Signed)
Pt was same day/late cancel today. I know pts sister brings to appts and is primary care giver for 2 people. This is 3rd late cancel or no show.  11/27/21 no show 01/23/22 late cancel 07/02/22 late cancel  Please advise how you would like to proceed.  Charge for missed visit? Final Warning? Dismissal?  Thank you - Drue Dun

## 2022-07-03 ENCOUNTER — Ambulatory Visit: Payer: Medicare Other | Admitting: Family Medicine

## 2022-07-03 NOTE — Telephone Encounter (Signed)
I learned that later in the day yesterday. Thank you.

## 2022-07-09 ENCOUNTER — Telehealth: Payer: Self-pay | Admitting: Hematology and Oncology

## 2022-07-09 NOTE — Telephone Encounter (Signed)
Scheduled appointment per 11/3 los. Left voicemail.

## 2022-07-10 ENCOUNTER — Other Ambulatory Visit: Payer: Self-pay | Admitting: *Deleted

## 2022-07-10 ENCOUNTER — Ambulatory Visit: Payer: Medicare Other | Admitting: Podiatry

## 2022-07-10 NOTE — Patient Outreach (Signed)
THN Post- Acute Care Coordinator follow up. Ms. Heinke resides in [facility]. Screening for potential [facility] care coordination services as benefit of insurance plan and PCP.  Update received from [place], [place] social worker. Ms. Walla will discharge from [facility] this week. States the plan is for Ms. Campa to move in with her daughter in [place]. Marita [place] reports she is in the process of arranging home health in [place].   Marthenia Rolling, MSN, RN,BSN [facility] Acute Care Coordinator [phone] (Direct dial)

## 2022-07-11 ENCOUNTER — Telehealth: Payer: Self-pay | Admitting: Family Medicine

## 2022-07-11 NOTE — Telephone Encounter (Signed)
Caller Name: Benard Rink Call back phone #: [phone]  Reason for Call: Please call pt, she is moving back to [place]. Had questions before her move.

## 2022-07-11 NOTE — Telephone Encounter (Signed)
Lft VM to rtn call. Dm/cma  

## 2022-07-13 ENCOUNTER — Other Ambulatory Visit: Payer: Self-pay | Admitting: *Deleted

## 2022-07-13 NOTE — Telephone Encounter (Signed)
Spoke to patient's sister, [name], she states that patient is moving on Monday and wanted to know how they would get medical records from [place]. Explained that a medical release couldbe filled out here if they know who she will be seeing up there or she can fill one out up there and have them fax it to [place]..  no further questions. Dm/cma

## 2022-07-13 NOTE — Patient Outreach (Signed)
[place] Coordinator follow up.  Verified in [facility] [name] discharged from [facility] on 07/12/22.  Monica Mccoy moved to [place] to be with daughter. SNF social worker arranged home health.   No identifiable [facility] care coordination needs.   Monica Rolling, MSN, RN,BSN [place] Acute Care Coordinator [phone] (Direct dial)

## 2022-07-17 ENCOUNTER — Encounter: Payer: Self-pay | Admitting: Family Medicine

## 2022-07-18 ENCOUNTER — Telehealth: Payer: Self-pay | Admitting: Family Medicine

## 2022-07-18 NOTE — Telephone Encounter (Signed)
Pt is wanting her last AVS sent to Dr Mickel Baas fax '@[phone]. I explained to her she needed to fill out a release. Pt at [phone]

## 2022-07-18 NOTE — Telephone Encounter (Signed)
Spoke to [place], patient's sister, whe will come by and sign a records release. Dm/cma

## 2022-07-23 NOTE — Telephone Encounter (Signed)
Medical release filled out and faxed to medical records by front desk.  Dm/cma

## 2022-07-24 ENCOUNTER — Inpatient Hospital Stay: Payer: Medicare Other | Admitting: Family Medicine

## 2022-07-24 ENCOUNTER — Inpatient Hospital Stay: Payer: Medicare Other | Admitting: Nurse Practitioner

## 2022-07-27 ENCOUNTER — Telehealth: Payer: Self-pay | Admitting: *Deleted

## 2022-07-27 ENCOUNTER — Inpatient Hospital Stay: Payer: Medicare Other

## 2022-07-27 NOTE — Telephone Encounter (Signed)
Received message from infusion stating pt has not shown for infusion appt.  Looking at pt chart, pt may have moved out of state.  RN attempt x1 to contact pt.  No answer, LVM for pt to return call to the office.

## 2022-08-03 LAB — CMP (EXT)
ALT/SGPT (EXT): 7 U/L (ref <34)
AST/SGOT (EXT): 11 U/L (ref <33)
Albumin (EXT): 3.8 g/dL (ref 3.5–5.2)
Alkaline Phosphatase (EXT): 87 U/L (ref 35–104)
Anion Gap (EXT): 9 mmol/L (ref 7–17)
BUN (EXT): 15 mg/dL (ref 6–23)
Bilirubin, Total (EXT): 0.4 mg/dL (ref 0.2–1.2)
CO2 (EXT): 27 mmol/L (ref 22–31)
CalciumCalcium (EXT): 8.3 mg/dL — ABNORMAL LOW (ref 8.8–10.7)
Chloride (EXT): 103 mmol/L (ref 98–107)
Creatinine (EXT): 0.95 mg/dL (ref 0.50–1.20)
GFR Estimated (Calc) (EXT): 71 mL/min/{1.73_m2} (ref >59)
Globulin (EXT): 3.4 g/dL (ref 2.3–4.2)
Glucose (EXT): 85 mg/dL (ref 70–100)
Potassium (EXT): 3.8 mmol/L (ref 3.4–5.1)
Protein (EXT): 7.2 g/dL (ref 6.4–8.3)
Sodium (EXT): 139 mmol/L (ref 136–145)

## 2022-08-24 ENCOUNTER — Inpatient Hospital Stay: Payer: Medicare Other

## 2022-08-30 LAB — CMP (EXT)
ALT/SGPT (EXT): 6 U/L (ref <34)
AST/SGOT (EXT): 12 U/L (ref <33)
Albumin (EXT): 4 g/dL (ref 3.5–5.2)
Alkaline Phosphatase (EXT): 83 U/L (ref 35–104)
Anion Gap (EXT): 8 mmol/L (ref 7–17)
BUN (EXT): 11 mg/dL (ref 6–23)
Bilirubin, Total (EXT): 0.6 mg/dL (ref 0.2–1.2)
CO2 (EXT): 29 mmol/L (ref 22–31)
CalciumCalcium (EXT): 8.9 mg/dL (ref 8.8–10.7)
Chloride (EXT): 103 mmol/L (ref 98–107)
Creatinine (EXT): 0.96 mg/dL (ref 0.50–1.20)
GFR Estimated (Calc) (EXT): 70 mL/min/{1.73_m2} (ref >59)
Globulin (EXT): 3.3 g/dL (ref 2.3–4.2)
Glucose (EXT): 85 mg/dL (ref 70–100)
Potassium (EXT): 4.1 mmol/L (ref 3.4–5.1)
Protein (EXT): 7.3 g/dL (ref 6.4–8.3)
Sodium (EXT): 140 mmol/L (ref 136–145)

## 2022-08-30 NOTE — Progress Notes (Signed)
Is the patient appropriate for Home Care?     Homebound Status: ***  Covid status: ***  Insurance carrier: Payor: / No coverage found.    Date of potential discharge: ***    Is there a next day need?     Does the patient have a PCP?     Is the patient a [facility] patient?    Does patient use Home O2    Have your cordinated with the IP care team on the patients IV?   Have your coordinated with the IP care team on the patients Drains?   Have your coordinated with the IP care team on the patients Wounds?

## 2022-08-31 ENCOUNTER — Encounter: Primary: Student in an Organized Health Care Education/Training Program

## 2022-08-31 NOTE — Telephone Encounter (Signed)
Prescreen phone call made to patients daughter who states patient is not active with any other agencies and not driving. COVID screening negative. Address/phone number/PCP confirmed.

## 2022-09-01 ENCOUNTER — Encounter: Admit: 2022-09-01 | Discharge: 2022-09-01 | Primary: Student in an Organized Health Care Education/Training Program

## 2022-09-01 NOTE — Home Health (Signed)
Patient age/gender/living situation/Primary Dx for homecare/Reason for Admission:57 year old female recently hospitilized for frequent falls and abdominal pain was found to have a uti treated than discharged to [facility]  for rehab,Patient has a history of breast and brain cancer she is being followed by [facility], cognitively she presents with defecits, and was unable to take a few steps during my assessment she is plesant, she has a hx of htn ble edema rt>left I was unable to get her weight as she was unable to step onto the scale . daughter reports patient has a good appetitie eating 3 meals plus snacks she is tolerating a regular diet with thin liquids. according to daughter patient has multiple episodes of bladder incontinence , patient does ambulate with her walker she denied pain during my assessment skin is in tact.  hx of gerdmagor depressive disorder, seizure disorder , urinary retention , hx of breast and brain cancer, cognitve communication defecit       Patient was referred to Home health for education and evaluation of Home safety and fall precautions.skilled assessment of alteration in gu/ education on s&s of uti, vital sign assesment, educate pcg on maintaining skin inegrity to prevent skin breakdown r/t incontinence      Current Problems requiring skilled service to be addressed by above interventions (clinical, mental, adl and functional mobility):patient is a high fall risk depennt with all personal care    Psychosocial and cognitive issues identified which may impact plan of care: pcg is having financial diffuculties and is challenged trying to care for her mom    Any additional services requested based on risk assessment? once ot assesses patient would benefit from hha, and msw    Homebound Status:yes    Patient Identified care representative:daughter    Reviewed DC plan at [facility] with:  Patient No  Caregiver/representative Yes       Call to MD(name)  to confirm Medications & Orders Obtained (who  you spoke with):will call on Monday

## 2022-09-03 ENCOUNTER — Telehealth: Payer: Medicare Other

## 2022-09-03 ENCOUNTER — Encounter: Admit: 2022-09-03 | Discharge: 2022-09-03 | Primary: Student in an Organized Health Care Education/Training Program

## 2022-09-03 NOTE — Home Health (Signed)
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  Found to have UTI and treated ABX. Transfered to [facility] 07/28/23 for rehab and now discharged home 08/30/22.  She is staying with daughter in one level apartment with 4 steps and railing to enter building and 3 flights of 6 steps to enter apartment. She was living with her  mother in [place] but mother died and is now living with daughter since october. PLOF: independent mobility without device. Reports falls.  Has L porta cath. Followed by [facility]. 1 bedroom apartment so is sleeping in recliner.  Reports pain 6/10 in both feet.    Home safety assessment completed and no noted  problems. Has RW.  Needs shower chair. OT assessment pending.Marland Kitchen      Upon initial assessment she presented with overall deconditioning,   fall risk, impaired balance,  decreasd independent mobliity. She  was independent with bed mobility on couch,  Sit/stand transfer with SBA to RW. Ambulated 40 feet x 2 with SBA with RW.  Stairs deferred on eval. Tinetti score 13/28 indicating fall risk.  HEP initiated . Written instructions left in home. She will benefit from PT to address above problems with goal of independent mobility.   PT revisit for continued HEP instruction, fall prevention, pain management   transfer, gait and stair  training.

## 2022-09-04 ENCOUNTER — Telehealth: Payer: Self-pay | Admitting: Family Medicine

## 2022-09-04 ENCOUNTER — Encounter: Primary: Student in an Organized Health Care Education/Training Program

## 2022-09-04 NOTE — Telephone Encounter (Signed)
Skip Estimable from  [facility] called and said she need pt recent medication list .. Mel Almond can be reached at [phone] and fax is [phone]

## 2022-09-05 ENCOUNTER — Encounter: Primary: Student in an Organized Health Care Education/Training Program

## 2022-09-05 NOTE — Telephone Encounter (Signed)
Tried to call the hospital to get more information to who I was sending this to. Since all I knew was the first name, was unable to get more information.  Dm/cma

## 2022-09-06 ENCOUNTER — Encounter: Primary: Student in an Organized Health Care Education/Training Program

## 2022-09-06 ENCOUNTER — Encounter: Admit: 2022-09-06 | Discharge: 2022-09-06 | Primary: Student in an Organized Health Care Education/Training Program

## 2022-09-07 ENCOUNTER — Encounter: Primary: Student in an Organized Health Care Education/Training Program

## 2022-09-10 ENCOUNTER — Encounter: Primary: Student in an Organized Health Care Education/Training Program

## 2022-09-12 ENCOUNTER — Encounter: Primary: Student in an Organized Health Care Education/Training Program

## 2022-09-13 ENCOUNTER — Encounter: Primary: Student in an Organized Health Care Education/Training Program

## 2022-09-17 ENCOUNTER — Encounter: Primary: Student in an Organized Health Care Education/Training Program

## 2022-09-19 ENCOUNTER — Encounter: Primary: Student in an Organized Health Care Education/Training Program

## 2022-09-20 ENCOUNTER — Encounter: Primary: Student in an Organized Health Care Education/Training Program

## 2022-09-21 ENCOUNTER — Inpatient Hospital Stay: Payer: Medicaid Other | Admitting: Hematology and Oncology

## 2022-09-21 ENCOUNTER — Inpatient Hospital Stay: Payer: Medicaid Other

## 2022-09-24 ENCOUNTER — Encounter: Primary: Student in an Organized Health Care Education/Training Program

## 2022-09-25 ENCOUNTER — Encounter: Payer: Self-pay | Admitting: Hematology and Oncology

## 2022-09-25 NOTE — Unmapped (Signed)
I certify that this patient is confined to his/her home and needs intermittent skilled nursing care, physical therapy/ and/or speech therapy or continues to need occupational therapy.  This patient is under my care and I have authorized the services on this plan of care and will periodically review the plan.

## 2022-09-26 ENCOUNTER — Encounter: Primary: Student in an Organized Health Care Education/Training Program

## 2022-09-27 ENCOUNTER — Encounter: Primary: Student in an Organized Health Care Education/Training Program

## 2022-10-02 ENCOUNTER — Ambulatory Visit: Payer: Medicare Other | Admitting: Neurology

## 2022-10-03 ENCOUNTER — Encounter: Primary: Student in an Organized Health Care Education/Training Program

## 2022-10-10 ENCOUNTER — Encounter: Primary: Student in an Organized Health Care Education/Training Program

## 2022-10-17 ENCOUNTER — Encounter: Primary: Student in an Organized Health Care Education/Training Program

## 2022-10-24 ENCOUNTER — Encounter: Primary: Student in an Organized Health Care Education/Training Program

## 2022-11-20 LAB — CMP (EXT)
ALT/SGPT (EXT): 6 U/L (ref <34)
AST/SGOT (EXT): 16 U/L (ref <33)
Albumin (EXT): 3.9 g/dL (ref 3.5–5.2)
Alkaline Phosphatase (EXT): 82 U/L (ref 35–104)
Anion Gap (EXT): 11 mmol/L (ref 7–17)
BUN (EXT): 10 mg/dL (ref 6–23)
Bilirubin, Total (EXT): 0.5 mg/dL (ref 0.2–1.2)
CO2 (EXT): 27 mmol/L (ref 22–31)
CalciumCalcium (EXT): 8.9 mg/dL (ref 8.8–10.7)
Chloride (EXT): 101 mmol/L (ref 98–107)
Creatinine (EXT): 0.84 mg/dL (ref 0.50–1.20)
GFR Estimated (Calc) (EXT): 82 mL/min/{1.73_m2} (ref >59)
Globulin (EXT): 3.5 g/dL (ref 2.3–4.2)
Glucose (EXT): 89 mg/dL (ref 70–100)
Potassium (EXT): 3.8 mmol/L (ref 3.4–5.1)
Protein (EXT): 7.4 g/dL (ref 6.4–8.3)
Sodium (EXT): 139 mmol/L (ref 136–145)

## 2022-11-30 LAB — CMP (EXT)
ALT/SGPT (EXT): 6 U/L (ref <34)
AST/SGOT (EXT): 8 U/L (ref <33)
Albumin (EXT): 4 g/dL (ref 3.5–5.2)
Alkaline Phosphatase (EXT): 85 U/L (ref 35–104)
Anion Gap (EXT): 10 mmol/L (ref 7–17)
BUN (EXT): 10 mg/dL (ref 6–23)
Bilirubin, Total (EXT): 0.6 mg/dL (ref 0.2–1.2)
CO2 (EXT): 28 mmol/L (ref 22–31)
CalciumCalcium (EXT): 8.9 mg/dL (ref 8.8–10.7)
Chloride (EXT): 103 mmol/L (ref 98–107)
Creatinine (EXT): 0.87 mg/dL (ref 0.50–1.20)
GFR Estimated (Calc) (EXT): 78 mL/min/{1.73_m2} (ref >59)
Globulin (EXT): 3.4 g/dL (ref 2.3–4.2)
Glucose (EXT): 117 mg/dL — ABNORMAL HIGH (ref 70–100)
Potassium (EXT): 3.7 mmol/L (ref 3.4–5.1)
Protein (EXT): 7.4 g/dL (ref 6.4–8.3)
Sodium (EXT): 141 mmol/L (ref 136–145)

## 2023-01-08 NOTE — Progress Notes (Signed)
Is the patient appropriate for Home Care?     Homebound Status: ***  Covid status: ***  Insurance carrier: Payor: [employer] / Plan: [employer] ONE CARE PLAN / Product Type: Dual /     Date of potential discharge: ***    Is there a next day need?     Does the patient have a PCP?     Is the patient a [facility] patient?    Does patient use Home O2    Have your cordinated with the IP care team on the patients IV?   Have your coordinated with the IP care team on the patients Drains?   Have your coordinated with the IP care team on the patients Wounds?

## 2023-01-10 NOTE — Telephone Encounter (Signed)
I am calling in regard to a referral for home health services we received from [facility].    I would like to confirm your address: [address] [place] [address]   Address confirmed: Yes    I would like to confirm your best contact number: [phone] Contact number confirmed: Yes    Do you have any other home care services or agencies coming into your home?    Other agency present: No    Do you have another nurse or physical therapist visiting you at home?    Other staff present: No    Do you have an appointment scheduled with your doctor in the next 7 days?  MD follow-up appointment: No - Comment: need to set up    Can I confirm that Quentin Angst, MD is your primary care physician?   PCP confirmed: Yes    If there are pets in the home, please ensure that your pet is in another room during the clinicians' home visits.     If there are weapons in the home, they need to be locked up and secured. If not, the clinician will need to leave the home.     Is anyone in the home experiencing flu-like symptoms or respiratory symptoms?    Flu-like symptoms: No    Did you have any new or changed medications that need to be picked up at the pharmacy?    New medications: N/A    Were you able to pick them up?    All medications in the home: N/A    Do you have the oxygen in your home?    Have oxygen at home: No    Do you have any supplies you need until our staff comes to your home?    Need supplies immediately: N/A    Could you please make your insurance card available for visit?    Could you please have all your medications available for your visit, both your prescriptions and any over-the-counter meds you are taking?    The services ordered for you are: SN PT OT      The team caring for you isLAW. I am the scheduler for you and my name is or the scheduler for you is Elyse. My/their return number [phone]. Please feel free to call me back if there is anything I can help with.     Comments:

## 2023-01-11 ENCOUNTER — Encounter: Admit: 2023-01-11 | Discharge: 2023-01-11 | Primary: Student in an Organized Health Care Education/Training Program

## 2023-01-11 NOTE — Home Health (Signed)
Start of Care    57 y.o female referred to [facility] after Rehab stay d/t weakness and multiple falls at home.  Patient has PMH of breast Ca with mets to brain (Double Mastectomy), sz, HTN, GERD, Dementia r/t radiation for brain CA.  Spoke with daughter who reports that patient is no longer being followed by [facility]. Daughter reports patient had fall yesterday after return home from Rehab no injuries noted.  Patient aproved for [facility] 20 hours per week through [employer].  Daughter working on getting her into Day Program.  Currently friend of family is staying with patient to assist with needs.      Patient ambulates with RW, however is in weakneded state and high fall risk.  has WC in home, but not using in doors.  PT/ OT eval. Checking with UR to see if MSW is covered under insurance.  Patient is A&O x 1-2.  Significant cognitive impairment.  patient requires 24 hour supervision. VSS, afebrile. LSC, no resp noted or reported.  Patient reports intermittant back pain, managing with Tylenol.  Patient is Inc of urine.  Wears depends.  Denies any bowel issues.  All medications are in home.  Has blister packs sent home with her from Rehab.  Daughter will be managing meds.  SN needed for SA, mental status assess, safety, med teaching and disease process teaching.  Called PCP office.  Med rec done.  POC approved

## 2023-01-15 ENCOUNTER — Encounter: Admit: 2023-01-15 | Discharge: 2023-01-15 | Primary: Student in an Organized Health Care Education/Training Program

## 2023-01-15 NOTE — Home Health (Signed)
PT initial assessment    57 yo female with PMH HTN,depression, GERD, metastatic breast CA to brain s/p B mastectomy, chemo,  XRT,  GERD, HTN, presented to [facility]  with weakness and falls. Transfered to [facility] 09/08/22 for rehab and now discharged home 01/11/23.  She lives in one level apartment with 2 steps and railing to enter.  PLOF:  independent with mobility prior to 11/23.  Friend currently staying with her.  She denies pain.  Has f/u Ruffin Frederick 02/11/23 for port flush and IV start per daughter.      Home safety assessment completed.  Transfer shower bench adjusted for correct side/position.  Has RW, grab bars, raised toilet seat, WC    Upon initial assessment she presented with overall deconditioning,   fall risk,  decreasd independent mobility. Alert to self.  Poor memory.  Friend reports fell out of bed so sleeping on couch.  She  was independent with bed mobility on couch,  Sit/stand transfer with max A from couch,  min A from wc.  Ambulated 35 feet  with min A and rw.  Tinetti score 8/28 indicating fall risk. Stairs deferred.    HEP initiated . Written instructions left in home. She will benefit from PT to address above problems with goal of SBA mobility in the home.  PT revisit for continued HEP instruction, fall prevention, pain assessment,   transfer, gait and stair  training.

## 2023-01-16 ENCOUNTER — Encounter: Admit: 2023-01-16 | Discharge: 2023-01-16 | Primary: Student in an Organized Health Care Education/Training Program

## 2023-01-17 ENCOUNTER — Encounter: Admit: 2023-01-17 | Discharge: 2023-01-17 | Primary: Student in an Organized Health Care Education/Training Program

## 2023-01-17 NOTE — Home Health (Signed)
Occupational Therapy initial evaluation:      57 year old female who had been just starting to receive visiting nurse services in January when she was hospitalized due to weakness and falls.  She had an extended rehab stay at the [facility] 09/08/22.  She was discharged home Jan 11, 2023.     PMHx: Metastatic breast cancer to brain s/p bilateral mastectomy, chemo, radiation, GERD, HTN, HTN, depression    PLOF: Prior to November 2023 client was independent with mobility.  During episode in January she did require assistance with ADLs and was a high fall risk.    DME: RW, grab bars (could use second grab bar along the back wall of tub) , RTS, wide wheelchair     Assessment: Caregiver/friend is present for evaluation.  Client is alert and oriented to self, her friend, not to date.  She reports having a fall this morning.  Caregiver stated she went in the other room to the bathroom, heard client fall.  Client has been sleeping on the sofa , sleeping seated upright with legs elevated via recliner, but is unable to push down the bottom of the recliner herself.  Caregiver feels it is likely that client attempted to get off of recliner section with foot piece still elevated.  Client is incontinent of urine, family put trash bags and quilted Chux on the sitting surface of the sofa.  Teaching done with client and caregiver to attempt toileting schedule.  Caregiver states she tries to provide cues and tells client she needs to try and get up and use the bathroom but client can be resistant to instruction.    Client at present requires  24-hour supervision and is chair bound for safety.  She has a wheelchair, however the wheelchair is unable to pass through the doorway to access her bedroom or bathroom.  OT will contact care manager at [facility] to acquire a bariatric commode.  Phone call made to daughter Katina Degree, after visit.  Dominga is feeling overwhelmed with client needs as presently she is in need of 24-hour  supervision for safety.  During visit, client able to stand from sofa with Min assist, ambulate to and from bathroom with contact-guard but max cues for safety with walker, especially with turning.  Tub transfer bench was wobbly, legs were adjusted, will practiced tub transfer with client next session.      Client will benefit from skilled occupational therapy services for ADL retraining, activity tolerance training, safety with functional ability.  Client is in agreement with plan.

## 2023-01-18 ENCOUNTER — Encounter: Admit: 2023-01-18 | Discharge: 2023-01-18 | Primary: Student in an Organized Health Care Education/Training Program

## 2023-01-18 NOTE — Progress Notes (Signed)
Is the patient appropriate for Home Care?     Homebound Status: ***  Covid status: ***  Insurance carrier: Payor: [employer] / Plan: [employer] ONE CARE PLAN / Product Type: Dual /     Date of potential discharge: ***    Is there a next day need?     Does the patient have a PCP?     Is the patient a [facility] patient?    Does patient use Home O2    Have your cordinated with the IP care team on the patients IV?   Have your coordinated with the IP care team on the patients Drains?   Have your coordinated with the IP care team on the patients Wounds?

## 2023-01-21 ENCOUNTER — Encounter: Primary: Student in an Organized Health Care Education/Training Program

## 2023-01-21 ENCOUNTER — Encounter: Admit: 2023-01-21 | Discharge: 2023-01-21 | Primary: Student in an Organized Health Care Education/Training Program

## 2023-01-21 NOTE — Home Health (Signed)
patient went to ED and was then transfered and admitted to [facility]

## 2023-01-22 ENCOUNTER — Encounter: Primary: Student in an Organized Health Care Education/Training Program

## 2023-01-23 ENCOUNTER — Encounter: Primary: Student in an Organized Health Care Education/Training Program

## 2023-01-23 NOTE — Unmapped (Signed)
I certify that this patient is confined to his/her home and needs intermittent skilled nursing care, physical therapy/ and/or speech therapy or continues to need occupational therapy.  This patient is under my care and I have authorized the services on this plan of care and will periodically review the plan.

## 2023-01-23 NOTE — Unmapped (Signed)
Verbal SOC was obtained on 01/11/23.

## 2023-01-24 ENCOUNTER — Encounter: Primary: Student in an Organized Health Care Education/Training Program

## 2023-01-25 ENCOUNTER — Encounter: Primary: Student in an Organized Health Care Education/Training Program

## 2023-01-28 ENCOUNTER — Encounter: Primary: Student in an Organized Health Care Education/Training Program

## 2023-01-29 ENCOUNTER — Encounter: Primary: Student in an Organized Health Care Education/Training Program

## 2023-01-30 ENCOUNTER — Encounter: Primary: Student in an Organized Health Care Education/Training Program

## 2023-01-31 ENCOUNTER — Encounter: Primary: Student in an Organized Health Care Education/Training Program

## 2023-02-01 ENCOUNTER — Encounter: Primary: Student in an Organized Health Care Education/Training Program

## 2023-02-04 ENCOUNTER — Encounter: Primary: Student in an Organized Health Care Education/Training Program

## 2023-02-05 ENCOUNTER — Encounter: Primary: Student in an Organized Health Care Education/Training Program

## 2023-02-06 ENCOUNTER — Encounter: Primary: Student in an Organized Health Care Education/Training Program

## 2023-02-07 ENCOUNTER — Encounter: Primary: Student in an Organized Health Care Education/Training Program

## 2023-02-08 ENCOUNTER — Encounter: Primary: Student in an Organized Health Care Education/Training Program

## 2023-02-12 ENCOUNTER — Encounter: Primary: Student in an Organized Health Care Education/Training Program

## 2023-02-15 ENCOUNTER — Encounter: Primary: Student in an Organized Health Care Education/Training Program

## 2023-02-20 ENCOUNTER — Encounter: Primary: Student in an Organized Health Care Education/Training Program

## 2023-02-22 ENCOUNTER — Encounter: Primary: Student in an Organized Health Care Education/Training Program

## 2023-02-27 ENCOUNTER — Encounter: Primary: Student in an Organized Health Care Education/Training Program

## 2023-02-27 NOTE — Telephone Encounter (Signed)
Attempted to schedule ROC for 7/3. Patient unavailable for visit today.

## 2023-03-01 ENCOUNTER — Inpatient Hospital Stay: Admit: 2023-03-01 | Discharge: 2023-03-01 | Discharge status: Against Medical Advice

## 2023-03-01 ENCOUNTER — Encounter: Primary: Student in an Organized Health Care Education/Training Program

## 2023-03-01 NOTE — ED Notes (Signed)
Pt daughter arrived tearful unaware pt had been transported here.  Pt and daughter state they don't want to be seen.  Daughter states she will take her home.  Pt states she just wants to leave.  Pt told she can leave ED.  Pt left in w/c with daughter.  Pt baseline w/c ambulatory status.       Serena Croissant, RN  03/01/23 [phone]

## 2023-03-01 NOTE — Telephone Encounter (Signed)
Call to PCP office, spoke with Morrie Sheldon for updated ROC orders for 03/04/23 as pt was unavailable for [facility] today.

## 2023-03-01 NOTE — ED Notes (Signed)
Friend called to ask if we can call pt's sister Archie Patten in [place] as she missed her flight today.  Archie Patten [phone].     Serena Croissant, RN  03/01/23 1920

## 2023-03-01 NOTE — ED Triage Notes (Signed)
Pt with a fall from standing trying to transfer from her wheelchair no HS no BT but with right ankle injury and back pain

## 2023-03-04 NOTE — Telephone Encounter (Signed)
Attempted to schedule ROC for 7/8. Spoke with patients daughter Katina Degree, who states patient has been in [employer] since 7/5.

## 2023-03-06 ENCOUNTER — Encounter: Primary: Student in an Organized Health Care Education/Training Program

## 2023-03-08 ENCOUNTER — Encounter: Primary: Student in an Organized Health Care Education/Training Program

## 2023-05-17 NOTE — Progress Notes (Signed)
Is the patient appropriate for Home Care?     Homebound Status: ***  Covid status: ***  Insurance carrier: Payor: [employer] / Plan: [employer] ONE CARE PLAN / Product Type: Dual /     Date of potential discharge: ***    Is there a next day need?     Does the patient have a PCP?     Is the patient a [facility] patient?    Does patient use Home O2    Have your cordinated with the IP care team on the patients IV?   Have your coordinated with the IP care team on the patients Drains?   Have your coordinated with the IP care team on the patients Wounds?

## 2023-06-19 ENCOUNTER — Encounter: Payer: Self-pay | Admitting: Hematology and Oncology

## 2023-06-19 ENCOUNTER — Other Ambulatory Visit: Payer: Self-pay

## 2023-06-19 ENCOUNTER — Emergency Department (HOSPITAL_COMMUNITY): Payer: Medicare (Managed Care)

## 2023-06-19 ENCOUNTER — Emergency Department (HOSPITAL_COMMUNITY)
Admission: EM | Admit: 2023-06-19 | Discharge: 2023-06-19 | Disposition: A | Discharge status: Home / Self Care | Payer: Medicare (Managed Care) | Attending: Emergency Medicine | Admitting: Emergency Medicine

## 2023-06-19 ENCOUNTER — Encounter (HOSPITAL_COMMUNITY): Payer: Self-pay | Admitting: Emergency Medicine

## 2023-06-19 DIAGNOSIS — R079 Chest pain, unspecified: Secondary | ICD-10-CM

## 2023-06-19 DIAGNOSIS — W010XXA Fall on same level from slipping, tripping and stumbling without subsequent striking against object, initial encounter: Secondary | ICD-10-CM | POA: Insufficient documentation

## 2023-06-19 DIAGNOSIS — Z20822 Contact with and (suspected) exposure to covid-19: Secondary | ICD-10-CM | POA: Diagnosis not present

## 2023-06-19 DIAGNOSIS — R0789 Other chest pain: Secondary | ICD-10-CM | POA: Diagnosis present

## 2023-06-19 LAB — SARS CORONAVIRUS 2 BY RT PCR: SARS Coronavirus 2 by RT PCR: NEGATIVE

## 2023-06-19 MED ORDER — DICLOFENAC SODIUM 1 % EX GEL: 4.0000 g | Freq: Four times a day (QID) | CUTANEOUS | 0 refills | Status: DC

## 2023-06-19 NOTE — Discharge Instructions (Addendum)
Use the gel as prescribed Also take tylenol 1017m(2 extra strength) four times a day.

## 2023-06-19 NOTE — ED Triage Notes (Signed)
Patient presents from home vis EMS. She moved in with her sister yesterday. Today she slipped and fel backward. Her sister was able to catch her and there both fell into the couch. She did not sustain any injuries, did not hit her head and did not lose consciousness. Post fall, she did complain of chest pain that radiated into the back for about 15 mins.     HX Brain cancer    EMS vitals: 76 HR 124/80 BP 98% SPO2 on room air 182 CBG

## 2023-06-19 NOTE — ED Provider Notes (Signed)
[place] EMERGENCY DEPARTMENT AT [facility] Provider Note   CSN: [id number] Arrival date & time: 06/19/23  1147     History  Chief Complaint  Patient presents with   Fall   Chest Pain    Monica Mccoy is a 57 y.o. female.  57 yo F with a cc of a fall.  Apparently the patient was ambulating and she had lost her balance and was caught by a family member but they had bumped up against something.  They denied any injury and then she developed chest pain and EMS was called.  Chest pain had resolved by the time EMS got there.  Patient tells me she has no pain currently.  She said it was across the front of her chest.  Lasted for just a second and then resolved.   Fall       Home Medications Prior to Admission medications   Medication Sig Start Date End Date Taking? Authorizing Provider  diclofenac Sodium (VOLTAREN) 1 % GEL Apply 4 g topically 4 (four) times daily. 06/19/23  Yes Melene Plan, DO  amLODipine (NORVASC) 5 MG tablet Take 5 mg by mouth daily. 06/03/22   [provider]  diclofenac (VOLTAREN) 50 MG EC tablet Take 1 tablet (50 mg total) by mouth 2 (two) times daily with a meal. Make sure eats or takes something before using this medicine Patient taking differently: Take 50 mg by mouth 2 (two) times daily as needed for mild pain (BEFORE A MEAL, IF TAKING). 03/09/22   Loyola Mast, MD  divalproex (DEPAKOTE) 250 MG DR tablet Take 1 tablet (250 mg total) by mouth 2 (two) times daily. 05/21/22   Loyola Mast, MD  docusate sodium (COLACE) 100 MG capsule Take 200 mg by mouth daily as needed for mild constipation.    [provider]  furosemide (LASIX) 20 MG tablet Take 20 mg by mouth in the morning.    [provider]  gabapentin (NEURONTIN) 300 MG capsule Take 2 capsules (600 mg total) by mouth 2 (two) times daily. Patient taking differently: Take 300-600 mg by mouth See admin instructions. Take 300 mg by mouth in the morning, 300 mg at 6  PM, and 600 mg at bedtime 05/21/22   Loyola Mast, MD  ibuprofen (ADVIL) 800 MG tablet Take 800 mg by mouth every 8 (eight) hours as needed for mild pain or headache.    [provider]  lisinopril (ZESTRIL) 10 MG tablet Take 1 tablet (10 mg total) by mouth daily. 05/21/22   Loyola Mast, MD  MELATONIN GUMMIES PO Take 10 mg by mouth at bedtime as needed (for sleep).    [provider]  mirabegron ER (MYRBETRIQ) 50 MG TB24 tablet Take 1 tablet (50 mg total) by mouth daily. 05/21/22   Loyola Mast, MD  pantoprazole (PROTONIX) 40 MG tablet Take 1 tablet (40 mg total) by mouth daily. Patient taking differently: Take 40 mg by mouth daily before breakfast. 05/21/22   Loyola Mast, MD  thiamine 100 MG tablet Take 1 tablet (100 mg total) by mouth daily. 02/02/22   Rhetta Mura, MD  TYLENOL 8 HOUR ARTHRITIS PAIN 650 MG CR tablet Take 650 mg by mouth every 8 (eight) hours as needed for pain.    [provider]  venlafaxine XR (EFFEXOR XR) 75 MG 24 hr capsule Take 1 capsule (75 mg total) by mouth daily with breakfast. 05/21/22   Loyola Mast, MD  Allergies    Dilaudid [hydromorphone hcl] and Morphine and codeine    Review of Systems   Review of Systems  Physical Exam Updated Vital Signs BP (!) 135/95   Pulse 64   Temp 98.2 F (36.8 C) (Oral)   Resp 19   SpO2 98%  Physical Exam Vitals and nursing note reviewed.  Constitutional:      General: She is not in acute distress.    Appearance: She is well-developed. She is not diaphoretic.  HENT:     Head: Normocephalic and atraumatic.  Eyes:     Pupils: Pupils are equal, round, and reactive to light.  Cardiovascular:     Rate and Rhythm: Normal rate and regular rhythm.     Heart sounds: No murmur heard.    No friction rub. No gallop.     Comments: Tenderness about the anterior chest wall bilaterally reproduces her discomfort. Pulmonary:     Effort: Pulmonary effort is normal.     Breath sounds: No  wheezing or rales.  Abdominal:     General: There is no distension.     Palpations: Abdomen is soft.     Tenderness: There is no abdominal tenderness.  Musculoskeletal:        General: No tenderness.     Cervical back: Normal range of motion and neck supple.  Skin:    General: Skin is warm and dry.  Neurological:     Mental Status: She is alert and oriented to person, place, and time.  Psychiatric:        Behavior: Behavior normal.     ED Results / Procedures / Treatments   Labs (all labs ordered are listed, but only abnormal results are displayed) Labs Reviewed  SARS CORONAVIRUS 2 BY RT PCR    EKG EKG Interpretation Date/Time:  Wednesday June 19 2023 12:01:09 EDT Ventricular Rate:  77 PR Interval:  166 QRS Duration:  106 QT Interval:  394 QTC Calculation: 446 R Axis:   -32  Text Interpretation: Sinus or ectopic atrial rhythm LVH with secondary repolarization abnormality flipped t waves anterior and laterally not seen on prior Otherwise no significant change Confirmed by Melene Plan [phone]) on 06/19/2023 1:51:34 PM  Radiology DG Chest Port 1 View  Result Date: 06/19/2023 CLINICAL DATA:  Chest pain after fall. EXAM: PORTABLE CHEST 1 VIEW COMPARISON:  06/09/2022 FINDINGS: Left chest port in place, tip in the upper SVC. Stable heart size and mediastinal contours. No focal airspace disease, pleural effusion, pulmonary edema or pneumothorax. No acute osseous abnormalities. IMPRESSION: No acute chest findings. Electronically Signed   By: Narda Rutherford M.D.   On: 06/19/2023 15:00    Procedures Procedures    Medications Ordered in ED Medications - No data to display  ED Course/ Medical Decision Making/ A&P                                 Medical Decision Making Amount and/or Complexity of Data Reviewed Radiology: ordered. ECG/medicine tests: ordered.   57 yo F with chief complaints of anterior chest pain after a fall earlier today.  Pain is reproduced on exam.   She had pain that last for seconds and then resolved.  Will obtain a plain film.  I think completely atypical of ACS or PE.  Show no further workup is warranted.  Family called and would like a COVID test performed.  Chest x-ray independently interpreted by me without  focal infiltrate or pneumothorax.  Will discharge back to her facility.  3:04 PM:  I have discussed the diagnosis/risks/treatment options with the patient.  Evaluation and diagnostic testing in the emergency department does not suggest an emergent condition requiring admission or immediate intervention beyond what has been performed at this time.  They will follow up with PCP. We also discussed returning to the ED immediately if new or worsening sx occur. We discussed the sx which are most concerning (e.g., sudden worsening pain, fever, inability to tolerate by mouth) that necessitate immediate return. Medications administered to the patient during their visit and any new prescriptions provided to the patient are listed below.  Medications given during this visit Medications - No data to display   The patient appears reasonably screen and/or stabilized for discharge and I doubt any other medical condition or other [facility] requiring further screening, evaluation, or treatment in the ED at this time prior to discharge.          Final Clinical Impression(s) / ED Diagnoses Final diagnoses:  Nonspecific chest pain    Rx / DC Orders ED Discharge Orders          Ordered    diclofenac Sodium (VOLTAREN) 1 % GEL  4 times daily        06/19/23 1503              Melene Plan, DO 06/19/23 1504

## 2023-08-16 ENCOUNTER — Inpatient Hospital Stay (HOSPITAL_COMMUNITY)
Admission: EM | Admit: 2023-08-16 | Discharge: 2023-08-19 | DRG: 689 | Disposition: A | Discharge status: Home Health | Payer: Medicare (Managed Care) | Attending: Internal Medicine | Admitting: Internal Medicine

## 2023-08-16 ENCOUNTER — Other Ambulatory Visit: Payer: Self-pay

## 2023-08-16 ENCOUNTER — Emergency Department (HOSPITAL_COMMUNITY): Payer: Medicare (Managed Care)

## 2023-08-16 ENCOUNTER — Encounter (HOSPITAL_COMMUNITY): Payer: Self-pay

## 2023-08-16 DIAGNOSIS — R569 Unspecified convulsions: Secondary | ICD-10-CM | POA: Diagnosis present

## 2023-08-16 DIAGNOSIS — C50911 Malignant neoplasm of unspecified site of right female breast: Secondary | ICD-10-CM

## 2023-08-16 DIAGNOSIS — E876 Hypokalemia: Secondary | ICD-10-CM | POA: Diagnosis present

## 2023-08-16 DIAGNOSIS — N390 Urinary tract infection, site not specified: Principal | ICD-10-CM | POA: Diagnosis present

## 2023-08-16 DIAGNOSIS — Z9221 Personal history of antineoplastic chemotherapy: Secondary | ICD-10-CM

## 2023-08-16 DIAGNOSIS — F419 Anxiety disorder, unspecified: Secondary | ICD-10-CM | POA: Diagnosis present

## 2023-08-16 DIAGNOSIS — G4733 Obstructive sleep apnea (adult) (pediatric): Secondary | ICD-10-CM | POA: Diagnosis present

## 2023-08-16 DIAGNOSIS — M1612 Unilateral primary osteoarthritis, left hip: Secondary | ICD-10-CM | POA: Diagnosis present

## 2023-08-16 DIAGNOSIS — Z885 Allergy status to narcotic agent status: Secondary | ICD-10-CM

## 2023-08-16 DIAGNOSIS — G43909 Migraine, unspecified, not intractable, without status migrainosus: Secondary | ICD-10-CM | POA: Diagnosis present

## 2023-08-16 DIAGNOSIS — G8929 Other chronic pain: Secondary | ICD-10-CM | POA: Diagnosis present

## 2023-08-16 DIAGNOSIS — K219 Gastro-esophageal reflux disease without esophagitis: Secondary | ICD-10-CM | POA: Diagnosis present

## 2023-08-16 DIAGNOSIS — Z83438 Family history of other disorder of lipoprotein metabolism and other lipidemia: Secondary | ICD-10-CM

## 2023-08-16 DIAGNOSIS — Z9013 Acquired absence of bilateral breasts and nipples: Secondary | ICD-10-CM

## 2023-08-16 DIAGNOSIS — Z87440 Personal history of urinary (tract) infections: Secondary | ICD-10-CM

## 2023-08-16 DIAGNOSIS — F32A Depression, unspecified: Secondary | ICD-10-CM | POA: Diagnosis present

## 2023-08-16 DIAGNOSIS — Z79899 Other long term (current) drug therapy: Secondary | ICD-10-CM

## 2023-08-16 DIAGNOSIS — E66813 Obesity, class 3: Secondary | ICD-10-CM | POA: Diagnosis present

## 2023-08-16 DIAGNOSIS — I272 Pulmonary hypertension, unspecified: Secondary | ICD-10-CM | POA: Diagnosis present

## 2023-08-16 DIAGNOSIS — G9389 Other specified disorders of brain: Secondary | ICD-10-CM | POA: Diagnosis present

## 2023-08-16 DIAGNOSIS — W19XXXA Unspecified fall, initial encounter: Secondary | ICD-10-CM | POA: Diagnosis present

## 2023-08-16 DIAGNOSIS — Z79891 Long term (current) use of opiate analgesic: Secondary | ICD-10-CM

## 2023-08-16 DIAGNOSIS — Z8249 Family history of ischemic heart disease and other diseases of the circulatory system: Secondary | ICD-10-CM

## 2023-08-16 DIAGNOSIS — I110 Hypertensive heart disease with heart failure: Secondary | ICD-10-CM | POA: Diagnosis present

## 2023-08-16 DIAGNOSIS — Z86718 Personal history of other venous thrombosis and embolism: Secondary | ICD-10-CM

## 2023-08-16 DIAGNOSIS — Z9842 Cataract extraction status, left eye: Secondary | ICD-10-CM

## 2023-08-16 DIAGNOSIS — Z8616 Personal history of COVID-19: Secondary | ICD-10-CM

## 2023-08-16 DIAGNOSIS — G9341 Metabolic encephalopathy: Secondary | ICD-10-CM | POA: Diagnosis present

## 2023-08-16 DIAGNOSIS — I5032 Chronic diastolic (congestive) heart failure: Secondary | ICD-10-CM | POA: Diagnosis present

## 2023-08-16 DIAGNOSIS — Z923 Personal history of irradiation: Secondary | ICD-10-CM

## 2023-08-16 DIAGNOSIS — I440 Atrioventricular block, first degree: Secondary | ICD-10-CM | POA: Diagnosis present

## 2023-08-16 DIAGNOSIS — Z8701 Personal history of pneumonia (recurrent): Secondary | ICD-10-CM

## 2023-08-16 DIAGNOSIS — Z853 Personal history of malignant neoplasm of breast: Secondary | ICD-10-CM

## 2023-08-16 DIAGNOSIS — R4182 Altered mental status, unspecified: Secondary | ICD-10-CM | POA: Diagnosis not present

## 2023-08-16 DIAGNOSIS — K760 Fatty (change of) liver, not elsewhere classified: Secondary | ICD-10-CM | POA: Diagnosis present

## 2023-08-16 DIAGNOSIS — I340 Nonrheumatic mitral (valve) insufficiency: Secondary | ICD-10-CM | POA: Diagnosis present

## 2023-08-16 DIAGNOSIS — Z8619 Personal history of other infectious and parasitic diseases: Secondary | ICD-10-CM

## 2023-08-16 DIAGNOSIS — Z6841 Body Mass Index (BMI) 40.0 and over, adult: Secondary | ICD-10-CM

## 2023-08-16 DIAGNOSIS — E7800 Pure hypercholesterolemia, unspecified: Secondary | ICD-10-CM | POA: Diagnosis present

## 2023-08-16 LAB — URINALYSIS, W/ REFLEX TO CULTURE (INFECTION SUSPECTED)
Bilirubin Urine: NEGATIVE
Glucose, UA: NEGATIVE mg/dL
Ketones, ur: NEGATIVE mg/dL
Nitrite: NEGATIVE
Protein, ur: NEGATIVE mg/dL
Specific Gravity, Urine: 1.016 (ref 1.005–1.030)
WBC, UA: >50 WBC/hpf (ref 0–5)
pH: 5 (ref 5.0–8.0)

## 2023-08-16 LAB — COMPREHENSIVE METABOLIC PANEL
ALT: 11 U/L (ref 0–44)
AST: 13 U/L — ABNORMAL LOW (ref 15–41)
Albumin: 3.7 g/dL (ref 3.5–5.0)
Alkaline Phosphatase: 83 U/L (ref 38–126)
Anion gap: 9 (ref 5–15)
BUN: 8 mg/dL (ref 6–20)
CO2: 25 mmol/L (ref 22–32)
Calcium: 9.2 mg/dL (ref 8.9–10.3)
Chloride: 105 mmol/L (ref 98–111)
Creatinine, Ser: 0.67 mg/dL (ref 0.44–1.00)
GFR, Estimated: >60 mL/min (ref >60)
Glucose, Bld: 89 mg/dL (ref 70–99)
Potassium: 3.4 mmol/L — ABNORMAL LOW (ref 3.5–5.1)
Sodium: 139 mmol/L (ref 135–145)
Total Bilirubin: 0.8 mg/dL (ref <1.2)
Total Protein: 7.9 g/dL (ref 6.5–8.1)

## 2023-08-16 LAB — LACTIC ACID, PLASMA
Lactic Acid, Venous: 2.1 mmol/L (ref 0.5–1.9)
Lactic Acid, Venous: 0.9 mmol/L (ref 0.5–1.9)

## 2023-08-16 LAB — RAPID URINE DRUG SCREEN, HOSP PERFORMED
Amphetamines: NOT DETECTED
Barbiturates: NOT DETECTED
Benzodiazepines: NOT DETECTED
Cocaine: NOT DETECTED
Opiates: NOT DETECTED
Tetrahydrocannabinol: POSITIVE — AB

## 2023-08-16 LAB — CBC
HCT: 39.6 % (ref 36.0–46.0)
Hemoglobin: 12.9 g/dL (ref 12.0–15.0)
MCH: 29.9 pg (ref 26.0–34.0)
MCHC: 32.6 g/dL (ref 30.0–36.0)
MCV: 91.7 fL (ref 80.0–100.0)
Platelets: 234 10*3/uL (ref 150–400)
RBC: 4.32 MIL/uL (ref 3.87–5.11)
RDW: 14 % (ref 11.5–15.5)
WBC: 4.2 10*3/uL (ref 4.0–10.5)
nRBC: 0 % (ref 0.0–0.2)

## 2023-08-16 LAB — AMMONIA: Ammonia: 33 umol/L (ref 9–35)

## 2023-08-16 LAB — ETHANOL: Alcohol, Ethyl (B): <10 mg/dL (ref <10)

## 2023-08-16 MED ORDER — HYDRALAZINE HCL 20 MG/ML IJ SOLN
10.0000 mg | Freq: Once | INTRAMUSCULAR | Status: AC
Start: 2023-08-16 — End: 2023-08-16
  Administered 2023-08-16: 10 mg via INTRAVENOUS
  Filled 2023-08-16: qty 1

## 2023-08-16 MED ORDER — SODIUM CHLORIDE 0.9 % IV SOLN
1.0000 g | Freq: Once | INTRAVENOUS | Status: AC
  Administered 2023-08-16: 1 g via INTRAVENOUS
  Filled 2023-08-16: qty 10
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
## 2023-08-16 NOTE — ED Triage Notes (Signed)
Pt bib EMS due to weakness & frequent falls today. Pt denies any injuries. Pt walks with a walker normally but couldn't get up today. Pt has h/o brain cancer & breast cancer.

## 2023-08-17 DIAGNOSIS — Z9221 Personal history of antineoplastic chemotherapy: Secondary | ICD-10-CM | POA: Diagnosis not present

## 2023-08-17 DIAGNOSIS — Z853 Personal history of malignant neoplasm of breast: Secondary | ICD-10-CM | POA: Diagnosis not present

## 2023-08-17 DIAGNOSIS — Z923 Personal history of irradiation: Secondary | ICD-10-CM | POA: Diagnosis not present

## 2023-08-17 DIAGNOSIS — G9341 Metabolic encephalopathy: Secondary | ICD-10-CM | POA: Diagnosis present

## 2023-08-17 DIAGNOSIS — Z6841 Body Mass Index (BMI) 40.0 and over, adult: Secondary | ICD-10-CM | POA: Diagnosis not present

## 2023-08-17 DIAGNOSIS — F419 Anxiety disorder, unspecified: Secondary | ICD-10-CM | POA: Diagnosis present

## 2023-08-17 DIAGNOSIS — W19XXXA Unspecified fall, initial encounter: Secondary | ICD-10-CM | POA: Diagnosis present

## 2023-08-17 DIAGNOSIS — K760 Fatty (change of) liver, not elsewhere classified: Secondary | ICD-10-CM | POA: Diagnosis present

## 2023-08-17 DIAGNOSIS — E876 Hypokalemia: Secondary | ICD-10-CM | POA: Diagnosis present

## 2023-08-17 DIAGNOSIS — I272 Pulmonary hypertension, unspecified: Secondary | ICD-10-CM | POA: Diagnosis present

## 2023-08-17 DIAGNOSIS — I5032 Chronic diastolic (congestive) heart failure: Secondary | ICD-10-CM | POA: Diagnosis present

## 2023-08-17 DIAGNOSIS — E66813 Obesity, class 3: Secondary | ICD-10-CM | POA: Diagnosis present

## 2023-08-17 DIAGNOSIS — Z9013 Acquired absence of bilateral breasts and nipples: Secondary | ICD-10-CM | POA: Diagnosis not present

## 2023-08-17 DIAGNOSIS — R4182 Altered mental status, unspecified: Secondary | ICD-10-CM | POA: Diagnosis present

## 2023-08-17 DIAGNOSIS — I340 Nonrheumatic mitral (valve) insufficiency: Secondary | ICD-10-CM | POA: Diagnosis present

## 2023-08-17 DIAGNOSIS — G8929 Other chronic pain: Secondary | ICD-10-CM | POA: Diagnosis present

## 2023-08-17 DIAGNOSIS — Z8249 Family history of ischemic heart disease and other diseases of the circulatory system: Secondary | ICD-10-CM | POA: Diagnosis not present

## 2023-08-17 DIAGNOSIS — Z8616 Personal history of COVID-19: Secondary | ICD-10-CM | POA: Diagnosis not present

## 2023-08-17 DIAGNOSIS — N390 Urinary tract infection, site not specified: Principal | ICD-10-CM | POA: Diagnosis present

## 2023-08-17 DIAGNOSIS — K219 Gastro-esophageal reflux disease without esophagitis: Secondary | ICD-10-CM | POA: Diagnosis present

## 2023-08-17 DIAGNOSIS — Z86718 Personal history of other venous thrombosis and embolism: Secondary | ICD-10-CM | POA: Diagnosis not present

## 2023-08-17 DIAGNOSIS — R569 Unspecified convulsions: Secondary | ICD-10-CM | POA: Diagnosis present

## 2023-08-17 DIAGNOSIS — E7800 Pure hypercholesterolemia, unspecified: Secondary | ICD-10-CM | POA: Diagnosis present

## 2023-08-17 DIAGNOSIS — I110 Hypertensive heart disease with heart failure: Secondary | ICD-10-CM | POA: Diagnosis present

## 2023-08-17 DIAGNOSIS — G9389 Other specified disorders of brain: Secondary | ICD-10-CM | POA: Diagnosis present

## 2023-08-17 DIAGNOSIS — F32A Depression, unspecified: Secondary | ICD-10-CM | POA: Diagnosis present

## 2023-08-17 LAB — CBC
HCT: 36.3 % (ref 36.0–46.0)
Hemoglobin: 11.8 g/dL — ABNORMAL LOW (ref 12.0–15.0)
MCH: 29.6 pg (ref 26.0–34.0)
MCHC: 32.5 g/dL (ref 30.0–36.0)
MCV: 91.2 fL (ref 80.0–100.0)
Platelets: 213 10*3/uL (ref 150–400)
RBC: 3.98 MIL/uL (ref 3.87–5.11)
RDW: 14.1 % (ref 11.5–15.5)
WBC: 4.6 10*3/uL (ref 4.0–10.5)
nRBC: 0 % (ref 0.0–0.2)

## 2023-08-17 LAB — BASIC METABOLIC PANEL
Anion gap: 9 (ref 5–15)
BUN: 8 mg/dL (ref 6–20)
CO2: 26 mmol/L (ref 22–32)
Calcium: 9 mg/dL (ref 8.9–10.3)
Chloride: 104 mmol/L (ref 98–111)
Creatinine, Ser: 0.79 mg/dL (ref 0.44–1.00)
GFR, Estimated: >60 mL/min (ref >60)
Glucose, Bld: 100 mg/dL — ABNORMAL HIGH (ref 70–99)
Potassium: 3.5 mmol/L (ref 3.5–5.1)
Sodium: 139 mmol/L (ref 135–145)

## 2023-08-17 LAB — VITAMIN B12: Vitamin B-12: 332 pg/mL (ref 180–914)

## 2023-08-17 LAB — HIV ANTIBODY (ROUTINE TESTING W REFLEX): HIV Screen 4th Generation wRfx: NONREACTIVE

## 2023-08-17 LAB — TSH: TSH: 1.927 u[IU]/mL (ref 0.350–4.500)

## 2023-08-17 MED ORDER — DIVALPROEX SODIUM 250 MG PO DR TAB
250.0000 mg | DELAYED_RELEASE_TABLET | Freq: Two times a day (BID) | ORAL | Status: DC
  Administered 2023-08-17 – 2023-08-19 (×5): 250 mg via ORAL
  Filled 2023-08-17 (×6): qty 1

## 2023-08-17 MED ORDER — AMLODIPINE BESYLATE 5 MG PO TABS
5.0000 mg | ORAL_TABLET | Freq: Every day | ORAL | Status: DC
  Administered 2023-08-17 – 2023-08-19 (×3): 5 mg via ORAL
  Filled 2023-08-17 (×3): qty 1

## 2023-08-17 MED ORDER — ACETAMINOPHEN 650 MG RE SUPP: 650.0000 mg | Freq: Four times a day (QID) | RECTAL | Status: DC | PRN

## 2023-08-17 MED ORDER — ACETAMINOPHEN 325 MG PO TABS
650.0000 mg | ORAL_TABLET | Freq: Four times a day (QID) | ORAL | Status: DC | PRN
  Administered 2023-08-17: 650 mg via ORAL
  Filled 2023-08-17: qty 2

## 2023-08-17 MED ORDER — FUROSEMIDE 20 MG PO TABS
20.0000 mg | ORAL_TABLET | Freq: Every day | ORAL | Status: DC
  Administered 2023-08-17 – 2023-08-19 (×3): 20 mg via ORAL
  Filled 2023-08-17 (×3): qty 1

## 2023-08-17 MED ORDER — ONDANSETRON HCL 4 MG/2ML IJ SOLN: 4.0000 mg | Freq: Four times a day (QID) | INTRAMUSCULAR | Status: DC | PRN

## 2023-08-17 MED ORDER — PANTOPRAZOLE SODIUM 40 MG PO TBEC
40.0000 mg | DELAYED_RELEASE_TABLET | Freq: Every day | ORAL | Status: DC
  Administered 2023-08-17 – 2023-08-19 (×3): 40 mg via ORAL
  Filled 2023-08-17 (×4): qty 1

## 2023-08-17 MED ORDER — THIAMINE MONONITRATE 100 MG PO TABS
100.0000 mg | ORAL_TABLET | Freq: Every day | ORAL | Status: DC
  Administered 2023-08-17: 100 mg via ORAL
  Filled 2023-08-17: qty 1

## 2023-08-17 MED ORDER — ONDANSETRON HCL 4 MG PO TABS: 4.0000 mg | ORAL_TABLET | Freq: Four times a day (QID) | ORAL | Status: DC | PRN

## 2023-08-17 MED ORDER — GABAPENTIN 300 MG PO CAPS
600.0000 mg | ORAL_CAPSULE | Freq: Two times a day (BID) | ORAL | Status: DC
  Administered 2023-08-17 – 2023-08-19 (×5): 600 mg via ORAL
  Filled 2023-08-17 (×5): qty 2

## 2023-08-17 MED ORDER — FUROSEMIDE 40 MG PO TABS: 20.0000 mg | ORAL_TABLET | Freq: Every morning | ORAL | Status: DC

## 2023-08-17 MED ORDER — SODIUM CHLORIDE 0.9 % IV SOLN
2.0000 g | INTRAVENOUS | Status: DC
  Administered 2023-08-17 – 2023-08-18 (×2): 2 g via INTRAVENOUS
  Filled 2023-08-17 (×2): qty 20

## 2023-08-17 MED ORDER — MIRABEGRON ER 25 MG PO TB24
50.0000 mg | ORAL_TABLET | Freq: Every day | ORAL | Status: DC
  Administered 2023-08-17 – 2023-08-19 (×3): 50 mg via ORAL
  Filled 2023-08-17 (×4): qty 2

## 2023-08-17 MED ORDER — OXYCODONE HCL 5 MG PO TABS
5.0000 mg | ORAL_TABLET | ORAL | Status: DC | PRN
  Administered 2023-08-17 – 2023-08-18 (×4): 5 mg via ORAL
  Filled 2023-08-17 (×4): qty 1

## 2023-08-17 MED ORDER — POTASSIUM CHLORIDE CRYS ER 20 MEQ PO TBCR
40.0000 meq | EXTENDED_RELEASE_TABLET | Freq: Once | ORAL | Status: AC
Start: 2023-08-17 — End: 2023-08-17
  Administered 2023-08-17: 40 meq via ORAL
  Filled 2023-08-17: qty 2

## 2023-08-17 MED ORDER — LISINOPRIL 10 MG PO TABS
10.0000 mg | ORAL_TABLET | Freq: Every day | ORAL | Status: DC
  Administered 2023-08-17 – 2023-08-19 (×3): 10 mg via ORAL
  Filled 2023-08-17 (×3): qty 1

## 2023-08-17 MED ORDER — ENOXAPARIN SODIUM 80 MG/0.8ML IJ SOSY
70.0000 mg | PREFILLED_SYRINGE | INTRAMUSCULAR | Status: DC
  Administered 2023-08-17 – 2023-08-19 (×3): 70 mg via SUBCUTANEOUS
  Filled 2023-08-17: qty 0.8
  Filled 2023-08-17: qty 0.7
  Filled 2023-08-17: qty 0.8

## 2023-08-17 MED ORDER — THIAMINE MONONITRATE 100 MG PO TABS
200.0000 mg | ORAL_TABLET | Freq: Every day | ORAL | Status: DC
  Administered 2023-08-18 – 2023-08-19 (×2): 200 mg via ORAL
  Filled 2023-08-17 (×2): qty 2

## 2023-08-17 NOTE — ED Notes (Signed)
Pt removed IV, unable to start another line. IV team consulted.

## 2023-08-17 NOTE — Progress Notes (Signed)
PROGRESS NOTE    Monica Mccoy  UJW:[id number] DOB: 01/13/1966 DOA: 08/16/2023 PCP: Pcp, No   Brief Narrative: 57 year old with past medical history significant for cognitive impairment, breast cancer with metastasis, depression, GERD, hypertension who presents to the ED with altered mental status she was only oriented to self.  At home she has been having worsening mentation over the last week and unable to take care of herself.  She seemed more confused and was brought to the ED for further assessment.  She was found to be afebrile hemodynamically stable.  Urinalysis concerning for infection.  UDS positive for THC.  Patient underwent CT head which showed no acute intracranial abnormality.  Chest x-ray showed no acute findings.  Admitted for treatment of acute metabolic encephalopathy and UTI.   Assessment & Plan:   Principal Problem:   Complicated UTI (urinary tract infection)   1-Acute metabolic encephalopathy secondary to urinary tract infection:  -Ammonia 33, alcohol less than 10, UDS positive for tetra Hydro cannabinol -CT head: No acute intracranial abnormality. Left frontal encephalomalacia. Generalized atrophy and findings of chronic microvascular disease. Will check TSH, B 12, Thiamine.  Start thiamine supplement.  Treat for UTI.  If no improvement could consider further work up.   Urinary tract infection -Patient presents with large leukocytes more than 50, few bacteria. -Follow urine culture -IV Ceftriaxone  Hypokalemia: -Replenish.   Chronic diastolic heart failure, pulmonary hypertension -Continue with Lasix  History of urinary retention: -Continue with Myrbetriq  GERD: Continue with pantoprazole  History of breast cancer; Continue to monitor outpatient follow-up  Seizure:  -Continued with Depakote  History of chemotherapy induced neuropathy:  -Continue with gabapentin    HTN:  -Continue with amlodipine and lisinopril -Continue with amlodipine,  furosemide, lisinopril.   Estimated body mass index is 51.17 kg/m as calculated from the following:   Height as of this encounter: 5\' 6"  (1.676 m).   Weight as of this encounter: 143.8 kg.   DVT prophylaxis: Lovenox Code Status: Full code Family Communication: Disposition Plan:  Status is: Inpatient Remains inpatient appropriate because: management of encephalopathy     Consultants:  none  Procedures:  none  Antimicrobials:    Subjective: Report suprapubic pain, back pain.    Objective: Vitals:   08/17/23 0430 08/17/23 0445 08/17/23 0500 08/17/23 0730  BP: 111/74 (!) 127/93 (!) 138/90 (!) 155/117  Pulse: 64 61 (!) 58 68  Resp: 20 17 20 16   Temp:    98.1 F (36.7 C)  TempSrc:      SpO2: 100% 100% 100% 100%  Weight:      Height:       No intake or output data in the 24 hours ending 08/17/23 0811 Filed Weights   08/16/23 1949  Weight: (!) 143.8 kg    Examination:  General exam: Appears calm and comfortable  Respiratory system: Clear to auscultation. Respiratory effort normal. Cardiovascular system: S1 & S2 heard, RRR.  Gastrointestinal system: Abdomen is nondistended, soft and nontender.  Central nervous system: Alert  Extremities: Symmetric 5 x 5 power.   Data Reviewed: I have personally reviewed following labs and imaging studies  CBC: Recent Labs  Lab 08/16/23 2028 08/17/23 0520  WBC 4.2 4.6  HGB 12.9 11.8*  HCT 39.6 36.3  MCV 91.7 91.2  PLT 234 213   Basic Metabolic Panel: Recent Labs  Lab 08/16/23 2028 08/17/23 0520  NA 139 139  K 3.4* 3.5  CL 105 104  CO2 25 26  GLUCOSE 89 100*  BUN 8 8  CREATININE 0.67 0.79  CALCIUM 9.2 9.0   GFR: Estimated Creatinine Clearance: 115.4 mL/min (by C-G formula based on SCr of 0.79 mg/dL). Liver Function Tests: Recent Labs  Lab 08/16/23 2028  AST 13*  ALT 11  ALKPHOS 83  BILITOT 0.8  PROT 7.9  ALBUMIN 3.7   No results for input(s): "LIPASE", "AMYLASE" in the last 168 hours. Recent  Labs  Lab 08/16/23 2035  AMMONIA 33   Coagulation Profile: No results for input(s): "INR", "PROTIME" in the last 168 hours. Cardiac Enzymes: No results for input(s): "CKTOTAL", "CKMB", "CKMBINDEX", "TROPONINI" in the last 168 hours. BNP (last 3 results) No results for input(s): "PROBNP" in the last 8760 hours. HbA1C: No results for input(s): "HGBA1C" in the last 72 hours. CBG: No results for input(s): "GLUCAP" in the last 168 hours. Lipid Profile: No results for input(s): "CHOL", "HDL", "LDLCALC", "TRIG", "CHOLHDL", "LDLDIRECT" in the last 72 hours. Thyroid Function Tests: No results for input(s): "TSH", "T4TOTAL", "FREET4", "T3FREE", "THYROIDAB" in the last 72 hours. Anemia Panel: No results for input(s): "VITAMINB12", "FOLATE", "FERRITIN", "TIBC", "IRON", "RETICCTPCT" in the last 72 hours. Sepsis Labs: Recent Labs  Lab 08/16/23 2035 08/16/23 2221  LATICACIDVEN 2.1* 0.9    No results found for this or any previous visit (from the past 240 hours).       Radiology Studies: CT Head Wo Contrast Result Date: 08/16/2023 CLINICAL DATA:  Fall.  Altered mental status. EXAM: CT HEAD WITHOUT CONTRAST TECHNIQUE: Contiguous axial images were obtained from the base of the skull through the vertex without intravenous contrast. RADIATION DOSE REDUCTION: This exam was performed according to the departmental dose-optimization program which includes automated exposure control, adjustment of the mA and/or kV according to patient size and/or use of iterative reconstruction technique. COMPARISON:  06/08/2022 FINDINGS: Brain: There is no mass, hemorrhage or extra-axial collection. There is generalized atrophy without lobar predilection. Hypodensity of the white matter is most commonly associated with chronic microvascular disease. Left frontal encephalomalacia. Mineralization in the left pons, unchanged. Vascular: No hyperdense vessel or unexpected vascular calcification. Skull: There are 2 left  calvarial craniotomy sites. Sinuses/Orbits: No fluid levels or advanced mucosal thickening of the visualized paranasal sinuses. No mastoid or middle ear effusion. Normal orbits. Other: None. IMPRESSION: 1. No acute intracranial abnormality. 2. Left frontal encephalomalacia. 3. Generalized atrophy and findings of chronic microvascular disease. Electronically Signed   By: Deatra Robinson M.D.   On: 08/16/2023 21:34   DG Chest Portable 1 View Result Date: 08/16/2023 CLINICAL DATA:  Weakness and frequent falls. EXAM: PORTABLE CHEST 1 VIEW COMPARISON:  June 19, 2023 FINDINGS: There is stable left-sided venous Port-A-Cath positioning. The heart size and mediastinal contours are within normal limits. Both lungs are clear. Radiopaque surgical clips are seen overlying the right axilla. The visualized skeletal structures are unremarkable. IMPRESSION: No active cardiopulmonary disease. Electronically Signed   By: Aram Candela M.D.   On: 08/16/2023 20:49        Scheduled Meds:  amLODipine  5 mg Oral Daily   divalproex  250 mg Oral BID   enoxaparin (LOVENOX) injection  70 mg Subcutaneous Q24H   furosemide  20 mg Oral Daily   gabapentin  600 mg Oral BID   lisinopril  10 mg Oral Daily   mirabegron ER  50 mg Oral Daily   pantoprazole  40 mg Oral Daily   thiamine  100 mg Oral Daily   Continuous Infusions:  cefTRIAXone (ROCEPHIN)  IV  LOS: 0 days    Time spent: 35 minutes    Manual Navarra A Naz Denunzio, MD [facility]   If 7PM-7AM, please contact night-coverage [email or web address]  08/17/2023, 8:11 AM

## 2023-08-17 NOTE — H&P (Signed)
History and Physical    Monica Mccoy HQI:[id number] DOB: 1965-10-02 DOA: 08/16/2023  PCP: Pcp, No   Chief Complaint: weakness, ams  HPI: Monica Mccoy is a 57 y.o. female with medical history significant of cognitive impairment, breast cancer with metastasis, depression, GERD, hypertension who presents emergency department due to altered mental status.  Patient was only oriented to self.  At home was having worsening mentation over the last week and unable to take care of herself.  She seemed more confused so she was brought to the ER for further assessment.  On arrival she was afebrile and hemodynamically stable.  Labs were obtained which showed WBC 4.2, hemoglobin 12.9, potassium 3.4, creatinine 0.67, lactic acid 2.1, 0.9.  Urinalysis concerning for infection.  UDS positive for THC.  Patient underwent CT head which showed no acute intracranial abnormality.  Chest x-ray showed no acute findings.  Patient was started on ceftriaxone for her UTI and admitted for further workup.   Review of Systems: Review of Systems  Constitutional:  Negative for chills and fever.  HENT: Negative.    Eyes: Negative.   Respiratory: Negative.    Cardiovascular: Negative.   Gastrointestinal: Negative.   Genitourinary:  Positive for dysuria, frequency and urgency.  Musculoskeletal: Negative.   Skin: Negative.   Neurological:  Positive for weakness.  Endo/Heme/Allergies: Negative.   Psychiatric/Behavioral: Negative.       As per HPI otherwise 10 point review of systems negative.   Allergies  Allergen Reactions   Dilaudid [Hydromorphone Hcl] Other (See Comments)    Confusion & hallucinations   Morphine And Codeine Other (See Comments)    Headaches     Past Medical History:  Diagnosis Date   Anxiety    Breast cancer metastasized to brain [facility])    Left breast   Chronic pain after cancer treatment    Depression    GERD (gastroesophageal reflux disease)    History of cancer chemotherapy     Breast cancer left breast   Seizure (HCC)     Past Surgical History:  Procedure Laterality Date   BRAIN SURGERY     brain tumor   MASTECTOMY Bilateral    ORIF ANKLE FRACTURE Right      reports that she has never smoked. She has never used smokeless tobacco. She reports current alcohol use. She reports current drug use. Drug: Marijuana.  Family History  Problem Relation Age of Onset   Obesity Mother    Hypertension Mother    Hyperlipidemia Mother    Diabetes Mother    Heart disease Mother     Prior to Admission medications   Medication Sig Start Date End Date Taking? Authorizing Provider  divalproex (DEPAKOTE) 250 MG DR tablet Take 1 tablet (250 mg total) by mouth 2 (two) times daily. 05/21/22  Yes Loyola Mast, MD  furosemide (LASIX) 20 MG tablet Take 20 mg by mouth in the morning.   Yes [provider]  oxyCODONE (OXY IR/ROXICODONE) 5 MG immediate release tablet Take by mouth. 06/12/23  Yes [provider]  pantoprazole (PROTONIX) 40 MG tablet Take 1 tablet (40 mg total) by mouth daily. Patient taking differently: Take 40 mg by mouth daily before breakfast. 05/21/22  Yes Loyola Mast, MD  amLODipine (NORVASC) 5 MG tablet Take 5 mg by mouth daily. 06/03/22   [provider]  diclofenac (VOLTAREN) 50 MG EC tablet Take 1 tablet (50 mg total) by mouth 2 (two) times daily with a meal. Make sure eats or takes  something before using this medicine Patient taking differently: Take 50 mg by mouth 2 (two) times daily as needed for mild pain (BEFORE A MEAL, IF TAKING). 03/09/22   Loyola Mast, MD  diclofenac Sodium (VOLTAREN) 1 % GEL Apply 4 g topically 4 (four) times daily. 06/19/23   Melene Plan, DO  docusate sodium (COLACE) 100 MG capsule Take 200 mg by mouth daily as needed for mild constipation.    [provider]  gabapentin (NEURONTIN) 300 MG capsule Take 2 capsules (600 mg total) by mouth 2 (two) times daily. Patient taking differently: Take  300-600 mg by mouth See admin instructions. Take 300 mg by mouth in the morning, 300 mg at 6 PM, and 600 mg at bedtime 05/21/22   Loyola Mast, MD  ibuprofen (ADVIL) 800 MG tablet Take 800 mg by mouth every 8 (eight) hours as needed for mild pain or headache.    [provider]  lisinopril (ZESTRIL) 10 MG tablet Take 1 tablet (10 mg total) by mouth daily. 05/21/22   Loyola Mast, MD  MELATONIN GUMMIES PO Take 10 mg by mouth at bedtime as needed (for sleep).    [provider]  mirabegron ER (MYRBETRIQ) 50 MG TB24 tablet Take 1 tablet (50 mg total) by mouth daily. 05/21/22   Loyola Mast, MD  thiamine 100 MG tablet Take 1 tablet (100 mg total) by mouth daily. 02/02/22   Rhetta Mura, MD  TYLENOL 8 HOUR ARTHRITIS PAIN 650 MG CR tablet Take 650 mg by mouth every 8 (eight) hours as needed for pain.    [provider]  venlafaxine XR (EFFEXOR XR) 75 MG 24 hr capsule Take 1 capsule (75 mg total) by mouth daily with breakfast. 05/21/22   Loyola Mast, MD    Physical Exam: Vitals:   08/16/23 2341 08/17/23 0045 08/17/23 0100 08/17/23 0115  BP: (!) 132/90 (!) 139/94 (!) 132/102 (!) 140/90  Pulse: 72 71 60 66  Resp: 16 (!) 24 (!) 21 (!) 22  Temp: 97.7 F (36.5 C)     TempSrc: Oral     SpO2: 100% 100% 100% 100%  Weight:      Height:       Physical Exam Vitals reviewed.  Constitutional:      Appearance: She is obese.  HENT:     Head: Normocephalic.     Nose: Nose normal.     Mouth/Throat:     Mouth: Mucous membranes are moist.     Pharynx: Oropharynx is clear.  Eyes:     Pupils: Pupils are equal, round, and reactive to light.  Cardiovascular:     Rate and Rhythm: Normal rate and regular rhythm.  Pulmonary:     Effort: Pulmonary effort is normal.     Breath sounds: Normal breath sounds.  Abdominal:     General: Abdomen is flat. Bowel sounds are normal.  Musculoskeletal:     Cervical back: Normal range of motion.  Neurological:     Mental Status:  She is alert.       Labs on Admission: I have personally reviewed the patients's labs and imaging studies.  Assessment/Plan Principal Problem:   Complicated UTI (urinary tract infection)   # Acute infectious encephalopathy most likely secondary to urinary tract infection - Patient is altered with urinalysis concerning for infection - Urinary frequency and smells of urine  Plan: Continue ceftriaxone Check labs in morning  # Volume overload with likely pulmonary hypertension-continue home Lasix  #  Urinary retention-continue Myrbetriq's  # GERD-continue pantoprazole  # History of breast cancer-continue to monitor with outpatient follow-up  History of prior seizure-continue Depakote twice daily  # Chemotherapy-induced neuropathy-continue gabapentin  # Hypertension-continue amlodipine, lisinopril   Admission status: Inpatient Telemetry  Certification: The appropriate patient status for this patient is INPATIENT. Inpatient status is judged to be reasonable and necessary in order to provide the required intensity of service to ensure the patient's safety. The patient's presenting symptoms, physical exam findings, and initial radiographic and laboratory data in the context of their chronic comorbidities is felt to place them at high risk for further clinical deterioration. Furthermore, it is not anticipated that the patient will be medically stable for discharge from the hospital within 2 midnights of admission.   * I certify that at the point of admission it is my clinical judgment that the patient will require inpatient hospital care spanning beyond 2 midnights from the point of admission due to high intensity of service, high risk for further deterioration and high frequency of surveillance required.Alan Mulder MD [facility] If 7PM-7AM, please contact night-coverage [email or web address]  08/17/2023, 2:51 AM

## 2023-08-18 DIAGNOSIS — N390 Urinary tract infection, site not specified: Secondary | ICD-10-CM | POA: Diagnosis not present

## 2023-08-18 LAB — RPR: RPR Ser Ql: NONREACTIVE

## 2023-08-18 MED ORDER — POTASSIUM CHLORIDE CRYS ER 20 MEQ PO TBCR
40.0000 meq | EXTENDED_RELEASE_TABLET | Freq: Once | ORAL | Status: AC
  Administered 2023-08-18: 40 meq via ORAL
  Filled 2023-08-18: qty 2

## 2023-08-18 NOTE — Progress Notes (Addendum)
PROGRESS NOTE    Monica Mccoy  WNU:[id number] DOB: [birth year] DOA: 08/16/2023 PCP: Pcp, No   Brief Narrative:  57 year old female with past medical history significant for cognitive impairment, breast cancer with metastasis, depression, GERD, hypertension presented to the ED ED with altered mental status she was only oriented to self.  At home she has been having worsening mentation over the last week and unable to take care of herself.  She seemed more confused and was brought to the ED for further assessment.  She was found to be afebrile, hemodynamically stable.  Urinalysis concerning for infection.  UDS positive for THC. CT head which showed no acute intracranial abnormality.  Chest x-ray showed no acute findings.  Patient was seen at our hospital for treatment of acute metabolic encephalopathy likely secondary to UTI.   Assessment & Plan:   Principal Problem:   Complicated UTI (urinary tract infection)  Acute metabolic encephalopathy secondary to urinary tract infection:  Ammonia level was 33, alcohol less than 10, UDS positive for tetra Hydro cannabinol.  CT head without any acute abnormality but encephalomalacia and generalized atrophy.  TSH within normal range.-B1 pending, RPR pending vitamin B12 within normal range.  Currently on thiamine supplement.  Treat for UTI. Start thiamine supplement.   Urinary tract infection Patient is symptomatic with dysuria, urgency, frequency.  Urinalysis with abnormal, urine culture pending.  Continue IV Rocephin.  Hypokalemia: Replenished and improved.  Latest potassium 3.5.  Check BMP in AM.  Chronic diastolic heart failure, pulmonary hypertension -Continue with Lasix, check BMP in AM.  History of urinary retention: -Continue with Myrbetriq  GERD: Continue with pantoprazole  History of breast cancer; Continue to monitor outpatient follow-up.  Follows up with oncology as outpatient.  Seizure:  -Continued with Depakote  History of  chemotherapy induced neuropathy:  -Continue with gabapentin   HTN:  -Continue with amlodipine and lisinopril and lisinopril.  Grade 3 obesity.   Body mass index is 51.17 kg/m. Would benefit from weight loss as outpatient.   DVT prophylaxis: Lovenox  Code Status: Full code  Family Communication:  spoke with the patient's sister Ms. [name] on the phone and updated her about the clinical condition of the patient  Disposition Plan: Likely home with home health on 08/19/2023 pending improvement.   Status is: Inpatient Remains inpatient appropriate because: encephalopathy, UTI, pending urine cultures.  Consultants:  none  Procedures:  none  Antimicrobials:   Rocephin IV  Subjective: Patient states that she still has some dysuria urgency and frequency.  Has lower abdominal pain.  No nausea vomiting fever or chills.  Is more communicative and alert today with   Objective: Vitals:   08/17/23 2146 08/18/23 0116 08/18/23 0528 08/18/23 0529  BP: 101/69 (!) 101/55 100/65 100/65  Pulse: 76 67 68 65  Resp: 20 18 16 16   Temp: 98.4 F (36.9 C) 98.4 F (36.9 C) 98 F (36.7 C) 98 F (36.7 C)  TempSrc: Oral Oral Oral Oral  SpO2: 98% 98% 99% 100%  Weight:      Height:        Intake/Output Summary (Last 24 hours) at 08/18/2023 1016 Last data filed at 08/18/2023 0500 Gross per 24 hour  Intake 100 ml  Output 300 ml  Net -200 ml   Filed Weights   08/16/23 1949  Weight: (!) 143.8 kg    Physical examination: Body mass index is 51.17 kg/m.  General: Obese built, not in obvious distress HENT:   No scleral pallor or icterus noted. Oral  mucosa is moist.  Chest:    Diminished breath sounds bilaterally. No crackles or wheezes.  Left chest wall port in place CVS: S1 &S2 heard. No murmur.  Regular rate and rhythm. Abdomen: Soft, mild lower abdominal tenderness on palpation., nondistended.  Bowel sounds are heard.  External catheter in place Extremities: No cyanosis, clubbing or  edema.  Peripheral pulses are palpable. Psych: Alert, awake and oriented to place and date,, normal mood CNS:  No cranial nerve deficits.  Power equal in all extremities.   Skin: Warm and dry.  No rashes noted.   Data Reviewed: I have personally reviewed following labs and imaging studies  CBC: Recent Labs  Lab 08/16/23 2028 08/17/23 0520  WBC 4.2 4.6  HGB 12.9 11.8*  HCT 39.6 36.3  MCV 91.7 91.2  PLT 234 213   Basic Metabolic Panel: Recent Labs  Lab 08/16/23 2028 08/17/23 0520  NA 139 139  K 3.4* 3.5  CL 105 104  CO2 25 26  GLUCOSE 89 100*  BUN 8 8  CREATININE 0.67 0.79  CALCIUM 9.2 9.0   GFR: Estimated Creatinine Clearance: 115.4 mL/min (by C-G formula based on SCr of 0.79 mg/dL). Liver Function Tests: Recent Labs  Lab 08/16/23 2028  AST 13*  ALT 11  ALKPHOS 83  BILITOT 0.8  PROT 7.9  ALBUMIN 3.7   No results for input(s): "LIPASE", "AMYLASE" in the last 168 hours. Recent Labs  Lab 08/16/23 2035  AMMONIA 33   Coagulation Profile: No results for input(s): "INR", "PROTIME" in the last 168 hours. Cardiac Enzymes: No results for input(s): "CKTOTAL", "CKMB", "CKMBINDEX", "TROPONINI" in the last 168 hours. BNP (last 3 results) No results for input(s): "PROBNP" in the last 8760 hours. HbA1C: No results for input(s): "HGBA1C" in the last 72 hours. CBG: No results for input(s): "GLUCAP" in the last 168 hours. Lipid Profile: No results for input(s): "CHOL", "HDL", "LDLCALC", "TRIG", "CHOLHDL", "LDLDIRECT" in the last 72 hours. Thyroid Function Tests: Recent Labs    08/17/23 1115  TSH 1.927   Anemia Panel: Recent Labs    08/17/23 1115  VITAMINB12 332   Sepsis Labs: Recent Labs  Lab 08/16/23 2035 08/16/23 2221  LATICACIDVEN 2.1* 0.9    No results found for this or any previous visit (from the past 240 hours).    Radiology Studies: CT Head Wo Contrast Result Date: 08/16/2023 CLINICAL DATA:  Fall.  Altered mental status. EXAM: CT HEAD  WITHOUT CONTRAST TECHNIQUE: Contiguous axial images were obtained from the base of the skull through the vertex without intravenous contrast. RADIATION DOSE REDUCTION: This exam was performed according to the departmental dose-optimization program which includes automated exposure control, adjustment of the mA and/or kV according to patient size and/or use of iterative reconstruction technique. COMPARISON:  06/08/2022 FINDINGS: Brain: There is no mass, hemorrhage or extra-axial collection. There is generalized atrophy without lobar predilection. Hypodensity of the white matter is most commonly associated with chronic microvascular disease. Left frontal encephalomalacia. Mineralization in the left pons, unchanged. Vascular: No hyperdense vessel or unexpected vascular calcification. Skull: There are 2 left calvarial craniotomy sites. Sinuses/Orbits: No fluid levels or advanced mucosal thickening of the visualized paranasal sinuses. No mastoid or middle ear effusion. Normal orbits. Other: None. IMPRESSION: 1. No acute intracranial abnormality. 2. Left frontal encephalomalacia. 3. Generalized atrophy and findings of chronic microvascular disease. Electronically Signed   By: Deatra Robinson M.D.   On: 08/16/2023 21:34   DG Chest Portable 1 View Result Date: 08/16/2023 CLINICAL DATA:  Weakness and frequent falls. EXAM: PORTABLE CHEST 1 VIEW COMPARISON:  June 19, 2023 FINDINGS: There is stable left-sided venous Port-A-Cath positioning. The heart size and mediastinal contours are within normal limits. Both lungs are clear. Radiopaque surgical clips are seen overlying the right axilla. The visualized skeletal structures are unremarkable. IMPRESSION: No active cardiopulmonary disease. Electronically Signed   By: Aram Candela M.D.   On: 08/16/2023 20:49        Scheduled Meds:  amLODipine  5 mg Oral Daily   divalproex  250 mg Oral BID   enoxaparin (LOVENOX) injection  70 mg Subcutaneous Q24H   furosemide   20 mg Oral Daily   gabapentin  600 mg Oral BID   lisinopril  10 mg Oral Daily   mirabegron ER  50 mg Oral Daily   pantoprazole  40 mg Oral Daily   thiamine  200 mg Oral Daily   Continuous Infusions:  cefTRIAXone (ROCEPHIN)  IV 2 g (08/17/23 2358)     LOS: 1 day    Joycelyn Das, MD [facility] If 7PM-7AM, please contact night-coverage [email or web address]  08/18/2023, 10:16 AM

## 2023-08-18 NOTE — Evaluation (Signed)
Occupational Therapy Evaluation Patient Details Name: Monica Mccoy MRN: [id number] DOB: [birth year] Today's Date: 08/18/2023   History of Present Illness Patient is a 57  year old female who presented with AMS and weakness. Patient was admitted with acute metabolic encephalopathy and UTI. PMH: COVID 19,right breast cancer metastatic to brain s/p b/l mastectomy, chemotherapy, whole brain radiation, seizure disorder, HTN, chronic pedal edema, depression   Clinical Impression   Patient is a 57 year old female who was admitted for above. Patient was living at home with family support at baseline. Currently, patient is supervision for bed mobility and to scoot up to [facility].  Patient with continued confusion during session but pleasant and cooperative. Plan to d/c home with family support and HH. Patient would continue to benefit from skilled OT services at this time while admitted and after d/c to address noted deficits in order to improve overall safety and independence in ADLs.        If plan is discharge home, recommend the following: A lot of help with walking and/or transfers;A lot of help with bathing/dressing/bathroom;Assistance with cooking/housework;Direct supervision/assist for medications management;Assist for transportation;Help with stairs or ramp for entrance;Direct supervision/assist for financial management;Supervision due to cognitive status    Functional Status Assessment  Patient has had a recent decline in their functional status and demonstrates the ability to make significant improvements in function in a reasonable and predictable amount of time.  Equipment Recommendations  None recommended by OT       Precautions / Restrictions Precautions Precautions: Fall Restrictions Weight Bearing Restrictions Per Provider Order: No      Mobility Bed Mobility Overal bed mobility: Needs Assistance Bed Mobility: Supine to Sit, Sit to Supine     Supine to sit: Supervision Sit to  supine: Supervision                Balance Overall balance assessment: Mild deficits observed, not formally tested             ADL either performed or assessed with clinical judgement   ADL Overall ADL's : Needs assistance/impaired Eating/Feeding: Modified independent;Sitting   Grooming: Wash/dry face;Wash/dry hands;Oral care;Sitting;Set up Grooming Details (indicate cue type and reason): EOB with increased time. noted to have chapped lips. nurse messaged about patient needing carmex. Upper Body Bathing: Supervision/ safety;Sitting   Lower Body Bathing: Maximal assistance;Sitting/lateral leans   Upper Body Dressing : Supervision/safety;Sitting   Lower Body Dressing: Sitting/lateral leans;Maximal assistance   Toilet Transfer: Supervision/safety Toilet Transfer Details (indicate cue type and reason): able to scoot up to [facility] with cues with supervision with increased time. patient delining to stand Toileting- Clothing Manipulation and Hygiene: Maximal assistance;Bed level                Pertinent Vitals/Pain Pain Assessment Pain Assessment: Faces Faces Pain Scale: Hurts a little bit Pain Location: R ankle Pain Descriptors / Indicators: Discomfort Pain Intervention(s): Limited activity within patient's tolerance, Monitored during session     Extremity/Trunk Assessment Upper Extremity Assessment Upper Extremity Assessment: Overall WFL for tasks assessed       Cervical / Trunk Assessment Cervical / Trunk Assessment: Normal      Cognition Arousal: Alert Behavior During Therapy: Flat affect Overall Cognitive Status: History of cognitive impairments - at baseline       General Comments: patient has a h/o brain mets. patient was cooperative and able to follow commands. had a hard time reporting PLOF  Home Living Family/patient expects to be discharged to:: Private residence Living Arrangements: Other relatives Available Help at  Discharge: Family       Additional Comments: patient reporting that she is homeless but lives with two family members, tonya and varnetta      Prior Functioning/Environment Prior Level of Function : Needs assist  Cognitive Assist : ADLs (cognitive)   ADLs (Cognitive): Step by step cues         ADLs Comments: patient needed supervision for ADLs at d/c from hospital last admission over 1 year ago.        OT Problem List: Decreased activity tolerance;Decreased coordination;Impaired balance (sitting and/or standing);Decreased safety awareness;Decreased knowledge of precautions;Decreased knowledge of use of DME or AE;Cardiopulmonary status limiting activity;Pain;Obesity      OT Treatment/Interventions: Self-care/ADL training;Therapeutic exercise;DME and/or AE instruction;Cognitive remediation/compensation;Patient/family education;Balance training    OT Goals(Current goals can be found in the care plan section) Acute Rehab OT Goals Patient Stated Goal: to get back in bed OT Goal Formulation: Patient unable to participate in goal setting Time For Goal Achievement: 09/01/23 Potential to Achieve Goals: Fair  OT Frequency: Min 1X/week    .   AM-PAC OT "6 Clicks" Daily Activity     Outcome Measure Help from another person eating meals?: None Help from another person taking care of personal grooming?: A Little Help from another person toileting, which includes using toliet, bedpan, or urinal?: A Lot Help from another person bathing (including washing, rinsing, drying)?: A Lot Help from another person to put on and taking off regular upper body clothing?: A Little Help from another person to put on and taking off regular lower body clothing?: A Lot 6 Click Score: 16   End of Session Nurse Communication: Mobility status  Activity Tolerance: Patient tolerated treatment well Patient left: in bed;with call bell/phone within reach;with bed alarm set  OT Visit Diagnosis: Unsteadiness on  feet (R26.81);Pain Pain - Right/Left: Right Pain - part of body: Ankle and joints of foot                Time: 3151-7616 OT Time Calculation (min): 17 min Charges:  OT General Charges $OT Visit: 1 Visit OT Evaluation $OT Eval Low Complexity: 1 Low  Eveny Anastas OTR/L, MS Acute Rehabilitation Department Office# [phone]   Selinda Flavin 08/18/2023, 12:12 PM

## 2023-08-18 NOTE — Evaluation (Signed)
Physical Therapy Evaluation Patient Details Name: Monica Mccoy MRN: [id number] DOB: 07-10-1966 Today's Date: 08/18/2023  History of Present Illness  Patient is a 57 year old female who presented with AMS and weakness. Patient was admitted with acute metabolic encephalopathy and UTI. 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  Pt currently with functional limitations due to the deficits listed below (see PT Problem List). Pt will benefit from acute skilled PT to increase their independence and safety with mobility to allow discharge.  Pt assisted with ambulating short distance with RW today.  Pt reports she stays on main level of her home and typically ambulates with RW at baseline.  Pt presenting today with antalgic gait however poorly able to describe pain, only "feet hurt." Anticipate pt will return home upon d/c with family support and recommend HHPT.         If plan is discharge home, recommend the following: A little help with walking and/or transfers;A little help with bathing/dressing/bathroom;Help with stairs or ramp for entrance;Assistance with cooking/housework   Can travel by private vehicle        Equipment Recommendations None recommended by PT  Recommendations for Other Services       Functional Status Assessment Patient has had a recent decline in their functional status and demonstrates the ability to make significant improvements in function in a reasonable and predictable amount of time.     Precautions / Restrictions Precautions Precautions: Fall Restrictions Weight Bearing Restrictions Per Provider Order: No      Mobility  Bed Mobility Overal bed mobility: Needs Assistance Bed Mobility: Supine to Sit     Supine to sit: Supervision, HOB elevated          Transfers Overall  transfer level: Needs assistance Equipment used: Rolling walker (2 wheels) Transfers: Sit to/from Stand Sit to Stand: Min assist           General transfer comment: verbal cues for safe technique, assist with safely landing in recliner at pt attempting to sit on armrest    Ambulation/Gait Ambulation/Gait assistance: Contact guard assist Gait Distance (Feet): 15 Feet Assistive device: Rolling walker (2 wheels) Gait Pattern/deviations: Step-to pattern, Decreased stance time - right, Antalgic Gait velocity: decr     General Gait Details: pt leading with R LE, poorly able to describe her pain but denies pain in R LE and reports pain in bil feet, distance limited by fatigue  Stairs            Wheelchair Mobility     Tilt Bed    Modified Rankin (Stroke Patients Only)       Balance Overall balance assessment: Mild deficits observed, not formally tested                                           Pertinent Vitals/Pain Pain Assessment Pain Assessment: Faces Faces Pain Scale: Hurts little more Pain Location: bil feet Pain Descriptors / Indicators: Tingling, Numbness Pain Intervention(s): Repositioned, Monitored during session    Home Living Family/patient expects to be discharged to:: Private residence Living Arrangements: Other relatives Available Help at Discharge: Family               Additional Comments: patient reporting that she is homeless but  lives with two family members, tonya and Regulatory affairs officer - Per OT    Prior Function Prior Level of Function : Needs assist  Cognitive Assist : ADLs (cognitive)   ADLs (Cognitive): Step by step cues       Mobility Comments: reports staying on the main level of home, ambulating with RW ADLs Comments: patient needed supervision for ADLs at d/c from hospital last admission over 1 year ago.     Extremity/Trunk Assessment   Upper Extremity Assessment Upper Extremity Assessment: Overall WFL for tasks  assessed    Lower Extremity Assessment Lower Extremity Assessment: Generalized weakness    Cervical / Trunk Assessment Cervical / Trunk Assessment: Normal  Communication   Communication Communication: Difficulty communicating thoughts/reduced clarity of speech  Cognition Arousal: Alert Behavior During Therapy: Flat affect Overall Cognitive Status: History of cognitive impairments - at baseline                                 General Comments: patient has a h/o brain mets. patient was cooperative and able to follow commands. had a hard time reporting PLOF        General Comments      Exercises     Assessment/Plan    PT Assessment Patient needs continued PT services  PT Problem List Decreased mobility;Decreased strength;Decreased activity tolerance;Decreased balance;Pain;Decreased knowledge of use of DME       PT Treatment Interventions Gait training;DME instruction;Functional mobility training;Therapeutic activities;Therapeutic exercise;Patient/family education;Balance training    PT Goals (Current goals can be found in the Care Plan section)  Acute Rehab PT Goals PT Goal Formulation: With patient Time For Goal Achievement: 09/01/23 Potential to Achieve Goals: Good    Frequency Min 1X/week     Co-evaluation               AM-PAC PT "6 Clicks" Mobility  Outcome Measure Help needed turning from your back to your side while in a flat bed without using bedrails?: A Little Help needed moving from lying on your back to sitting on the side of a flat bed without using bedrails?: A Little Help needed moving to and from a bed to a chair (including a wheelchair)?: A Little Help needed standing up from a chair using your arms (e.g., wheelchair or bedside chair)?: A Little Help needed to walk in hospital room?: A Little Help needed climbing 3-5 steps with a railing? : A Lot 6 Click Score: 17    End of Session Equipment Utilized During Treatment: Gait  belt Activity Tolerance: Patient tolerated treatment well Patient left: in chair;with call bell/phone within reach;with chair alarm set Nurse Communication: Mobility status PT Visit Diagnosis: Difficulty in walking, not elsewhere classified (R26.2)    Time: 7829-5621 PT Time Calculation (min) (ACUTE ONLY): 19 min   Charges:   PT Evaluation $PT Eval Low Complexity: 1 Low   PT General Charges $$ ACUTE PT VISIT: 1 Visit       Thomasene Mohair PT, DPT Physical Therapist Acute Rehabilitation Services Office: [phone]   Kati L Payson 08/18/2023, 1:10 PM

## 2023-08-18 NOTE — Plan of Care (Signed)
  Problem: Activity: Goal: Risk for activity intolerance will decrease Outcome: Progressing   Problem: Pain Management: Goal: General experience of comfort will improve Outcome: Progressing   Problem: Skin Integrity: Goal: Risk for impaired skin integrity will decrease Outcome: Progressing

## 2023-08-19 DIAGNOSIS — N390 Urinary tract infection, site not specified: Secondary | ICD-10-CM | POA: Diagnosis not present

## 2023-08-19 LAB — CBC
HCT: 33 % — ABNORMAL LOW (ref 36.0–46.0)
Hemoglobin: 10.6 g/dL — ABNORMAL LOW (ref 12.0–15.0)
MCH: 29.6 pg (ref 26.0–34.0)
MCHC: 32.1 g/dL (ref 30.0–36.0)
MCV: 92.2 fL (ref 80.0–100.0)
Platelets: 205 10*3/uL (ref 150–400)
RBC: 3.58 MIL/uL — ABNORMAL LOW (ref 3.87–5.11)
RDW: 14.2 % (ref 11.5–15.5)
WBC: 3.5 10*3/uL — ABNORMAL LOW (ref 4.0–10.5)
nRBC: 0 % (ref 0.0–0.2)

## 2023-08-19 LAB — BASIC METABOLIC PANEL
Anion gap: 8 (ref 5–15)
BUN: 20 mg/dL (ref 6–20)
CO2: 24 mmol/L (ref 22–32)
Calcium: 8.4 mg/dL — ABNORMAL LOW (ref 8.9–10.3)
Chloride: 102 mmol/L (ref 98–111)
Creatinine, Ser: 1.1 mg/dL — ABNORMAL HIGH (ref 0.44–1.00)
GFR, Estimated: 59 mL/min — ABNORMAL LOW (ref >60)
Glucose, Bld: 98 mg/dL (ref 70–99)
Potassium: 4.4 mmol/L (ref 3.5–5.1)
Sodium: 134 mmol/L — ABNORMAL LOW (ref 135–145)

## 2023-08-19 LAB — URINE CULTURE: Culture: >=100000 — AB

## 2023-08-19 LAB — MAGNESIUM: Magnesium: 2.2 mg/dL (ref 1.7–2.4)

## 2023-08-19 MED ORDER — DICLOFENAC SODIUM 1 % EX GEL
4.0000 g | Freq: Four times a day (QID) | CUTANEOUS | 0 refills | Status: DC
Start: 2023-08-19 — End: 2024-04-21

## 2023-08-19 MED ORDER — CEFDINIR 300 MG PO CAPS: 300.0000 mg | ORAL_CAPSULE | Freq: Two times a day (BID) | ORAL | 0 refills | Status: DC

## 2023-08-19 MED ORDER — OXYCODONE HCL 5 MG PO TABS: 5.0000 mg | ORAL_TABLET | Freq: Four times a day (QID) | ORAL | 0 refills | Status: AC | PRN

## 2023-08-19 MED ORDER — CEFADROXIL 500 MG PO CAPS: 500.0000 mg | ORAL_CAPSULE | Freq: Two times a day (BID) | ORAL | 0 refills | Status: AC

## 2023-08-19 MED ORDER — AMLODIPINE BESYLATE 5 MG PO TABS: 5.0000 mg | ORAL_TABLET | Freq: Every day | ORAL | 2 refills | Status: DC

## 2023-08-19 MED ORDER — GABAPENTIN 300 MG PO CAPS: 600.0000 mg | ORAL_CAPSULE | Freq: Two times a day (BID) | ORAL | 2 refills | Status: DC

## 2023-08-19 NOTE — TOC Transition Note (Addendum)
Transition of Care [facility]) - Discharge Note   Patient Details  Name: Monica Mccoy MRN: [id number] Date of Birth: 12/28/1965  [facility] CM/SW Contact:  Howell Rucks, RN Phone Number: 08/19/2023, 11:09 AM   Clinical Narrative: Call to pt's sister, Laqueta Jean at [phone] [facility]), introduced role of TOC/NCM and reviewed for dc planning, PT recommendation for [facility] PT/OT, sister agreeable, reports they have used [facility] in the past,  sister reports she resides with her and her dtr, home DME -walker, reports pt had home caregivers in the past but no current home care services. Sister reports family is unable to provide transportation but someone will be home to assist pt in the home. NCM will provide Taxi Voucher for pt. No further TOC needs identified.     -11:39am Call received from pt's sister, Archie Patten, requesting non emergency ambulance as there is no family available to assist pt up stairs to apartment. PTAR for transport.   -1:05pm [facility] rep-Amy, accepted for [facility] PT[facility], added to AVS. Confirmed pt's insurance is in network.    Final next level of care: Home w Home Health Services Barriers to Discharge: Barriers Resolved   Patient Goals and CMS Choice Patient states their goals for this hospitalization and ongoing recovery are:: return home with family CMS Medicare.gov Compare Post Acute Care list provided to:: Patient Represenative (must comment) Laqueta Jean (Sister)  [phone] (Mobile)) Choice offered to / list presented to : Sibling Laqueta Jean (Sister)  [phone] (Mobile))      Discharge Placement                  Name of family member notified: Laqueta Jean (Sister)  [phone] (Mobile) Patient and family notified of of transfer: 08/19/23  Discharge Plan and Services Additional resources added to the After Visit Summary for                            [facility] Arranged: PT, OT          Social Drivers of Health (SDOH) Interventions SDOH  Screenings   Food Insecurity: No Food Insecurity (08/17/2023)  Housing: Patient Unable To Answer (08/17/2023)  Transportation Needs: Patient Unable To Answer (08/17/2023)  Utilities: Patient Unable To Answer (08/17/2023)  Alcohol Screen: Low Risk  (03/27/2022)  Depression (PHQ2-9): High Risk (05/21/2022)  Financial Resource Strain: Low Risk  (03/27/2022)  Physical Activity: Insufficiently Active (03/27/2022)  Social Connections: Feeling Socially Integrated (01/11/2023)   Received from [facility]  Stress: No Stress Concern Present (03/27/2022)  Tobacco Use: Low Risk  (08/16/2023)     Readmission Risk Interventions    08/19/2023   11:05 AM 06/11/2022   11:48 AM 06/10/2022   10:14 AM  Readmission Risk Prevention Plan  Post Dischage Appt Complete    Medication Screening Complete    Transportation Screening Complete Complete Complete  PCP or Specialist Appt within 5-7 Days  Complete Complete  Home Care Screening  Complete Complete  Medication Review (RN CM)  Complete Complete

## 2023-08-19 NOTE — Discharge Summary (Signed)
Physician Discharge Summary  Monica Mccoy ZOX:[id number] DOB: February 24, 1966 DOA: 08/16/2023  PCP: Oneita Hurt, No  Admit date: 08/16/2023 Discharge date: 08/19/2023  Admitted From: Home  Discharge disposition: Home with home health   Recommendations for Outpatient Follow-Up:   Follow up with your primary care provider in one week.  Check CBC, BMP, magnesium in the next visit Follow-up with your oncologist as outpatient.   Discharge Diagnosis:   Principal Problem:   Complicated UTI (urinary tract infection)   Discharge Condition: Improved.  Diet recommendation  Regular.  Wound care: None.  Code status: Full.   History of Present Illness:   57 year old female with past medical history significant for cognitive impairment, breast cancer with metastasis, depression, GERD, hypertension presented to the ED ED with altered mental status she was only oriented to self.  At home she has been having worsening mentation over the last week and unable to take care of herself.  She seemed more confused and was brought to the ED for further assessment.  She was found to be afebrile, hemodynamically stable.  Urinalysis concerning for infection.  UDS positive for THC. CT head which showed no acute intracranial abnormality.  Chest x-ray showed no acute findings.  Patient was seen at our hospital for treatment of acute metabolic encephalopathy likely secondary to UTI.   Hospital Course:   Following conditions were addressed during hospitalization as listed below,  Acute metabolic encephalopathy secondary to urinary tract infection:  Improved.  Ammonia level was 33, alcohol less than 10, UDS positive for tetrahydro cannabinol.  CT head without any acute abnormality but encephalomalacia and generalized atrophy.  TSH within normal range.-B1 pending, RPR nonreactive.  vitamin B12 within normal range.  On thiamine at home.  Encephalopathy has improved at this time.  Patient received Rocephin during  hospitalization and will continue cefadroxil on discharge to complete 5-day course.   Urinary tract infection Patient was symptomatic with dysuria, urgency, frequency.  Urine culture showing Proteus sensitive to cephalosporin.  Will continue cefadroxil for next 3 days and discharged to complete 5-day course.  Patient received IV Rocephin during hospitalization.  Hypokalemia: Improved after replacement.  Latest potassium of 4.4   Chronic diastolic heart failure, pulmonary hypertension Continue Lasix on discharge.   History of urinary retention: -Continue with Myrbetriq   GERD: Continue with pantoprazole   History of breast cancer; Continue to monitor outpatient follow-up.  Follows up with oncology as outpatient.  Looks like in remission currently.   Seizure:  -Continued with Depakote.  Compliance to medications were thoroughly emphasized to the patient.   History of chemotherapy induced neuropathy:  -Continue with gabapentin    HTN:  -Continue with amlodipine and lisinopril and lisinopril on discharge.   Grade 3 obesity.   Body mass index is 51.17 kg/m. Would benefit from weight loss as outpatient.   Disposition.  At this time, patient is stable for disposition with outpatient PCP and oncology follow-up.  Spoke with the patient's sister 08/18/2023  Medical Consultants:   None.  Procedures:    None Subjective:   Today, patient feels overall okay.  Still has some suprapubic discomfort.  Denies any nausea vomiting fever chills or rigor.  Discharge Exam:   Vitals:   08/19/23 0534 08/19/23 0917  BP: (!) 122/105 (!) 122/105  Pulse: 69   Resp: 16   Temp: 98.8 F (37.1 C)   SpO2: 100%    Vitals:   08/18/23 1302 08/18/23 2050 08/19/23 0534 08/19/23 0917  BP: 135/61 116/60 (!) 122/105 Marland Kitchen)  122/105  Pulse: 77 71 69   Resp: 16 20 16    Temp: 98.3 F (36.8 C) 98.8 F (37.1 C) 98.8 F (37.1 C)   TempSrc: Oral Oral Oral   SpO2: 100% 99% 100%   Weight:      Height:       Body mass index is 51.17 kg/m.   General: Alert awake, not in obvious distress, morbidly obese HENT: pupils equally reacting to light,  No scleral pallor or icterus noted. Oral mucosa is moist.  Chest:  Clear breath sounds.  Diminished breath sounds bilaterally. No crackles or wheezes.  CVS: S1 &S2 heard. No murmur.  Regular rate and rhythm. Abdomen: Soft, nontender, nondistended.  Bowel sounds are heard.   Extremities: No cyanosis, clubbing or edema.  Peripheral pulses are palpable. Psych: Alert, awake and oriented to place person and time. CNS:  No cranial nerve deficits.  Power equal in all extremities.   Skin: Warm and dry.  No rashes noted.  The results of significant diagnostics from this hospitalization (including imaging, microbiology, ancillary and laboratory) are listed below for reference.     Diagnostic Studies:   CT Head Wo Contrast Result Date: 08/16/2023 CLINICAL DATA:  Fall.  Altered mental status. EXAM: CT HEAD WITHOUT CONTRAST TECHNIQUE: Contiguous axial images were obtained from the base of the skull through the vertex without intravenous contrast. RADIATION DOSE REDUCTION: This exam was performed according to the departmental dose-optimization program which includes automated exposure control, adjustment of the mA and/or kV according to patient size and/or use of iterative reconstruction technique. COMPARISON:  06/08/2022 FINDINGS: Brain: There is no mass, hemorrhage or extra-axial collection. There is generalized atrophy without lobar predilection. Hypodensity of the white matter is most commonly associated with chronic microvascular disease. Left frontal encephalomalacia. Mineralization in the left pons, unchanged. Vascular: No hyperdense vessel or unexpected vascular calcification. Skull: There are 2 left calvarial craniotomy sites. Sinuses/Orbits: No fluid levels or advanced mucosal thickening of the visualized paranasal sinuses. No mastoid or middle ear effusion.  Normal orbits. Other: None. IMPRESSION: 1. No acute intracranial abnormality. 2. Left frontal encephalomalacia. 3. Generalized atrophy and findings of chronic microvascular disease. Electronically Signed   By: Deatra Robinson M.D.   On: 08/16/2023 21:34   DG Chest Portable 1 View Result Date: 08/16/2023 CLINICAL DATA:  Weakness and frequent falls. EXAM: PORTABLE CHEST 1 VIEW COMPARISON:  June 19, 2023 FINDINGS: There is stable left-sided venous Port-A-Cath positioning. The heart size and mediastinal contours are within normal limits. Both lungs are clear. Radiopaque surgical clips are seen overlying the right axilla. The visualized skeletal structures are unremarkable. IMPRESSION: No active cardiopulmonary disease. Electronically Signed   By: Aram Candela M.D.   On: 08/16/2023 20:49     Labs:   Basic Metabolic Panel: Recent Labs  Lab 08/16/23 2028 08/17/23 0520 08/19/23 0520  NA 139 139 134*  K 3.4* 3.5 4.4  CL 105 104 102  CO2 25 26 24   GLUCOSE 89 100* 98  BUN 8 8 20   CREATININE 0.67 0.79 1.10*  CALCIUM 9.2 9.0 8.4*  MG  --   --  2.2   GFR Estimated Creatinine Clearance: 83.9 mL/min (A) (by C-G formula based on SCr of 1.1 mg/dL (H)). Liver Function Tests: Recent Labs  Lab 08/16/23 2028  AST 13*  ALT 11  ALKPHOS 83  BILITOT 0.8  PROT 7.9  ALBUMIN 3.7   No results for input(s): "LIPASE", "AMYLASE" in the last 168 hours. Recent Labs  Lab 08/16/23  2035  AMMONIA 33   Coagulation profile No results for input(s): "INR", "PROTIME" in the last 168 hours.  CBC: Recent Labs  Lab 08/16/23 2028 08/17/23 0520 08/19/23 0520  WBC 4.2 4.6 3.5*  HGB 12.9 11.8* 10.6*  HCT 39.6 36.3 33.0*  MCV 91.7 91.2 92.2  PLT 234 213 205   Cardiac Enzymes: No results for input(s): "CKTOTAL", "CKMB", "CKMBINDEX", "TROPONINI" in the last 168 hours. BNP: Invalid input(s): "POCBNP" CBG: No results for input(s): "GLUCAP" in the last 168 hours. D-Dimer No results for input(s):  "DDIMER" in the last 72 hours. Hgb A1c No results for input(s): "HGBA1C" in the last 72 hours. Lipid Profile No results for input(s): "CHOL", "HDL", "LDLCALC", "TRIG", "CHOLHDL", "LDLDIRECT" in the last 72 hours. Thyroid function studies Recent Labs    08/17/23 1115  TSH 1.927   Anemia work up Recent Labs    08/17/23 1115  VITAMINB12 332   Microbiology Recent Results (from the past 240 hours)  Urine Culture     Status: Abnormal   Collection Time: 08/16/23 10:21 PM   Specimen: Urine, Random  Result Value Ref Range Status   Specimen Description   Final    URINE, RANDOM Performed at [facility], [address], [place], [place] [address]    Special Requests   Final    NONE Reflexed from [phone] [id number] Performed at [facility], [address] [place], [place] [address]    Culture >=100,000 COLONIES/mL PROTEUS MIRABILIS (A)  Final   Report Status 08/19/2023 FINAL  Final   Organism ID, Bacteria PROTEUS MIRABILIS (A)  Final      Susceptibility   Proteus mirabilis - MIC*    AMPICILLIN >=32 RESISTANT Resistant     CEFAZOLIN 8 SENSITIVE Sensitive     CEFEPIME <=0.12 SENSITIVE Sensitive     CEFTRIAXONE <=0.25 SENSITIVE Sensitive     CIPROFLOXACIN <=0.25 SENSITIVE Sensitive     GENTAMICIN <=1 SENSITIVE Sensitive     IMIPENEM 2 SENSITIVE Sensitive     NITROFURANTOIN 128 RESISTANT Resistant     TRIMETH/SULFA <=20 SENSITIVE Sensitive     AMPICILLIN/SULBACTAM 8 SENSITIVE Sensitive     PIP/TAZO <=4 SENSITIVE Sensitive ug/mL    * >=100,000 COLONIES/mL PROTEUS MIRABILIS     Discharge Instructions:   Discharge Instructions     Call MD for:  persistant nausea and vomiting   Complete by: As directed    Call MD for:  severe uncontrolled pain   Complete by: As directed    Call MD for:  temperature >100.4   Complete by: As directed    Diet general   Complete by: As directed    Discharge instructions   Complete by: As directed    Follow-up with  your primary care provider in 1 week.  Complete the course of antibiotic.  Increase hydration.  Seek medical attention for worsening symptoms.  Please continue to take medications from home as has been prescribed.   Increase activity slowly   Complete by: As directed       Allergies as of 08/19/2023       Reactions   Dilaudid [hydromorphone Hcl] Other (See Comments)   Confusion & hallucinations   Morphine And Codeine Other (See Comments)   Headaches        Medication List     STOP taking these medications    diclofenac 50 MG EC tablet Commonly known as: VOLTAREN   docusate sodium 100 MG capsule Commonly known as: COLACE  ibuprofen 800 MG tablet Commonly known as: ADVIL       TAKE these medications    amLODipine 5 MG tablet Commonly known as: NORVASC Take 1 tablet (5 mg total) by mouth daily.   cefadroxil 500 MG capsule Commonly known as: DURICEF Take 1 capsule (500 mg total) by mouth 2 (two) times daily for 3 days.   diclofenac Sodium 1 % Gel Commonly known as: VOLTAREN Apply 4 g topically 4 (four) times daily.   divalproex 250 MG DR tablet Commonly known as: DEPAKOTE Take 1 tablet (250 mg total) by mouth 2 (two) times daily.   furosemide 20 MG tablet Commonly known as: LASIX Take 20 mg by mouth in the morning.   gabapentin 300 MG capsule Commonly known as: NEURONTIN Take 2 capsules (600 mg total) by mouth 2 (two) times daily.   lisinopril 10 MG tablet Commonly known as: ZESTRIL Take 1 tablet (10 mg total) by mouth daily.   MELATONIN GUMMIES PO Take 10 mg by mouth at bedtime as needed (for sleep).   mirabegron ER 50 MG Tb24 tablet Commonly known as: Myrbetriq Take 1 tablet (50 mg total) by mouth daily.   oxyCODONE 5 MG immediate release tablet Commonly known as: Oxy IR/ROXICODONE Take 1 tablet (5 mg total) by mouth every 6 (six) hours as needed for up to 5 days for severe pain (pain score 7-10). What changed:  how much to take when to take  this reasons to take this   pantoprazole 40 MG tablet Commonly known as: PROTONIX Take 1 tablet (40 mg total) by mouth daily. What changed: when to take this   thiamine 100 MG tablet Commonly known as: VITAMIN B1 Take 1 tablet (100 mg total) by mouth daily.   Tylenol 8 Hour Arthritis Pain 650 MG CR tablet Generic drug: acetaminophen Take 650 mg by mouth every 8 (eight) hours as needed for pain.   venlafaxine XR 75 MG 24 hr capsule Commonly known as: Effexor XR Take 1 capsule (75 mg total) by mouth daily with breakfast.        Follow-up Information     Primary care provider Follow up in 1 week(s).                   Time coordinating discharge: 39 minutes  Signed:  Hilario Robarts  [facility] 08/19/2023, 11:07 AM

## 2023-08-19 NOTE — Plan of Care (Signed)
  Problem: Nutrition: Goal: Adequate nutrition will be maintained Outcome: Progressing   Problem: Pain Management: Goal: General experience of comfort will improve Outcome: Progressing   Problem: Safety: Goal: Ability to remain free from injury will improve Outcome: Progressing   Problem: Skin Integrity: Goal: Risk for impaired skin integrity will decrease Outcome: Progressing

## 2023-08-19 NOTE — Discharge Planning (Signed)
Pt discharged home via EMS transport. RN called Archie Patten pt's caregiver and went over discharge instruction. Voiced understanding.

## 2023-08-22 LAB — VITAMIN B1: Vitamin B1 (Thiamine): 98.5 nmol/L (ref 66.5–200.0)

## 2023-11-02 ENCOUNTER — Encounter: Payer: Self-pay | Admitting: Hematology and Oncology

## 2023-11-02 ENCOUNTER — Emergency Department (HOSPITAL_COMMUNITY)
Admission: EM | Admit: 2023-11-02 | Discharge: 2023-11-02 | Disposition: A | Discharge status: Home / Self Care | Payer: Medicare (Managed Care) | Attending: Emergency Medicine | Admitting: Emergency Medicine

## 2023-11-02 ENCOUNTER — Emergency Department (HOSPITAL_COMMUNITY): Payer: Medicare (Managed Care)

## 2023-11-02 DIAGNOSIS — Z79899 Other long term (current) drug therapy: Secondary | ICD-10-CM | POA: Diagnosis not present

## 2023-11-02 DIAGNOSIS — R0602 Shortness of breath: Secondary | ICD-10-CM | POA: Insufficient documentation

## 2023-11-02 DIAGNOSIS — I10 Essential (primary) hypertension: Secondary | ICD-10-CM | POA: Insufficient documentation

## 2023-11-02 DIAGNOSIS — R062 Wheezing: Secondary | ICD-10-CM | POA: Diagnosis not present

## 2023-11-02 DIAGNOSIS — R4182 Altered mental status, unspecified: Secondary | ICD-10-CM | POA: Insufficient documentation

## 2023-11-02 DIAGNOSIS — Z853 Personal history of malignant neoplasm of breast: Secondary | ICD-10-CM | POA: Insufficient documentation

## 2023-11-02 LAB — URINALYSIS, W/ REFLEX TO CULTURE (INFECTION SUSPECTED)
Bacteria, UA: NONE SEEN
Bilirubin Urine: NEGATIVE
Glucose, UA: NEGATIVE mg/dL
Hgb urine dipstick: NEGATIVE
Ketones, ur: NEGATIVE mg/dL
Leukocytes,Ua: NEGATIVE
Nitrite: NEGATIVE
Protein, ur: NEGATIVE mg/dL
Specific Gravity, Urine: 1.023 (ref 1.005–1.030)
pH: 5 (ref 5.0–8.0)

## 2023-11-02 LAB — CBC WITH DIFFERENTIAL/PLATELET
Abs Immature Granulocytes: 0.02 10*3/uL (ref 0.00–0.07)
Basophils Absolute: 0 10*3/uL (ref 0.0–0.1)
Basophils Relative: 1 %
Eosinophils Absolute: 0.1 10*3/uL (ref 0.0–0.5)
Eosinophils Relative: 3 %
HCT: 41.2 % (ref 36.0–46.0)
Hemoglobin: 12.6 g/dL (ref 12.0–15.0)
Immature Granulocytes: 1 %
Lymphocytes Relative: 14 %
Lymphs Abs: 0.6 10*3/uL — ABNORMAL LOW (ref 0.7–4.0)
MCH: 28.4 pg (ref 26.0–34.0)
MCHC: 30.6 g/dL (ref 30.0–36.0)
MCV: 92.8 fL (ref 80.0–100.0)
Monocytes Absolute: 0.6 10*3/uL (ref 0.1–1.0)
Monocytes Relative: 15 %
Neutro Abs: 2.6 10*3/uL (ref 1.7–7.7)
Neutrophils Relative %: 66 %
Platelets: 224 10*3/uL (ref 150–400)
RBC: 4.44 MIL/uL (ref 3.87–5.11)
RDW: 13.8 % (ref 11.5–15.5)
WBC: 3.9 10*3/uL — ABNORMAL LOW (ref 4.0–10.5)
nRBC: 0 % (ref 0.0–0.2)

## 2023-11-02 LAB — RAPID URINE DRUG SCREEN, HOSP PERFORMED
Amphetamines: NOT DETECTED
Barbiturates: NOT DETECTED
Benzodiazepines: NOT DETECTED
Cocaine: POSITIVE — AB
Opiates: NOT DETECTED
Tetrahydrocannabinol: POSITIVE — AB

## 2023-11-02 LAB — COMPREHENSIVE METABOLIC PANEL
ALT: 12 U/L (ref 0–44)
AST: 14 U/L — ABNORMAL LOW (ref 15–41)
Albumin: 4 g/dL (ref 3.5–5.0)
Alkaline Phosphatase: 86 U/L (ref 38–126)
Anion gap: 7 (ref 5–15)
BUN: 10 mg/dL (ref 6–20)
CO2: 28 mmol/L (ref 22–32)
Calcium: 9.2 mg/dL (ref 8.9–10.3)
Chloride: 102 mmol/L (ref 98–111)
Creatinine, Ser: 0.69 mg/dL (ref 0.44–1.00)
GFR, Estimated: >60 mL/min (ref >60)
Glucose, Bld: 102 mg/dL — ABNORMAL HIGH (ref 70–99)
Potassium: 3.9 mmol/L (ref 3.5–5.1)
Sodium: 137 mmol/L (ref 135–145)
Total Bilirubin: 0.8 mg/dL (ref 0.0–1.2)
Total Protein: 8.7 g/dL — ABNORMAL HIGH (ref 6.5–8.1)

## 2023-11-02 LAB — RESP PANEL BY RT-PCR (RSV, FLU A&B, COVID)  RVPGX2
Influenza A by PCR: NEGATIVE
Influenza B by PCR: NEGATIVE
Resp Syncytial Virus by PCR: NEGATIVE
SARS Coronavirus 2 by RT PCR: NEGATIVE

## 2023-11-02 LAB — ETHANOL: Alcohol, Ethyl (B): <10 mg/dL (ref <10)

## 2023-11-02 LAB — TROPONIN I (HIGH SENSITIVITY): Troponin I (High Sensitivity): 5 ng/L (ref <18)

## 2023-11-02 LAB — PROTIME-INR
INR: 1 (ref 0.8–1.2)
Prothrombin Time: 12.9 s (ref 11.4–15.2)

## 2023-11-02 LAB — VALPROIC ACID LEVEL: Valproic Acid Lvl: 18 ug/mL — ABNORMAL LOW (ref 50.0–100.0)

## 2023-11-02 MED ORDER — IOHEXOL 350 MG/ML SOLN
100.0000 mL | Freq: Once | INTRAVENOUS | Status: AC | PRN
  Administered 2023-11-02: 75 mL via INTRAVENOUS

## 2023-11-02 MED ORDER — DEXAMETHASONE 4 MG PO TABS
10.0000 mg | ORAL_TABLET | Freq: Once | ORAL | Status: AC
  Administered 2023-11-02: 10 mg via ORAL
  Filled 2023-11-02: qty 1

## 2023-11-02 NOTE — ED Triage Notes (Signed)
 Patient presents from home due to increased altered mental status over the past 2 days and well as increased shortness of breath. Her home aid tested positive for TB. EMS noted expiratory wheezing and increased shortness of breath with activity. Patient complains of chest pressure. EMS administered 5 Albuterol, 0.5 Atrovent. Denies nausea and vomiting.     HX Brain cancer  EMS vitals: 22 RR  60 HR 100% SPO2 on room air 188/94 BP

## 2023-11-02 NOTE — Discharge Instructions (Signed)
 Follow up with your doctor in the office.

## 2023-11-02 NOTE — ED Provider Notes (Signed)
 [place] EMERGENCY DEPARTMENT AT [facility] Provider Note   CSN: [id number] Arrival date & time: 11/02/23  1115     History {Add pertinent medical, surgical, social history, OB history to HPI:1} No chief complaint on file.   Monica Mccoy is a 58 y.o. female.  58 year old female with prior medical history as detailed below presents for evaluation.  Patient's concerned about apparent change in mental status and increased confusion over the last 2 days.  Patient also with reported mild shortness of breath.  EMS reported wheezing on their initial evaluation.  They administered albuterol and Atrovent.  Patient reports improvement in her breathing upon my evaluation.  Patient otherwise without specific complaint.  Patient with past medical history significant for cognitive impairment, breast cancer with metastasis, depression, GERD, and hypertension.  The history is provided by the patient and medical records.       Home Medications Prior to Admission medications   Medication Sig Start Date End Date Taking? Authorizing Provider  amLODipine (NORVASC) 5 MG tablet Take 1 tablet (5 mg total) by mouth daily. 08/19/23 11/17/23  Pokhrel, Rebekah Chesterfield, MD  diclofenac Sodium (VOLTAREN) 1 % GEL Apply 4 g topically 4 (four) times daily. 08/19/23   Pokhrel, Rebekah Chesterfield, MD  divalproex (DEPAKOTE) 250 MG DR tablet Take 1 tablet (250 mg total) by mouth 2 (two) times daily. 05/21/22   Loyola Mast, MD  furosemide (LASIX) 20 MG tablet Take 20 mg by mouth in the morning.    [provider]  gabapentin (NEURONTIN) 300 MG capsule Take 2 capsules (600 mg total) by mouth 2 (two) times daily. 08/19/23 11/17/23  Pokhrel, Rebekah Chesterfield, MD  lisinopril (ZESTRIL) 10 MG tablet Take 1 tablet (10 mg total) by mouth daily. Patient not taking: Reported on 08/17/2023 05/21/22   Loyola Mast, MD  MELATONIN GUMMIES PO Take 10 mg by mouth at bedtime as needed (for sleep). Patient not taking: Reported on  08/17/2023    [provider]  mirabegron ER (MYRBETRIQ) 50 MG TB24 tablet Take 1 tablet (50 mg total) by mouth daily. Patient not taking: Reported on 08/17/2023 05/21/22   Loyola Mast, MD  pantoprazole (PROTONIX) 40 MG tablet Take 1 tablet (40 mg total) by mouth daily. Patient taking differently: Take 40 mg by mouth daily before breakfast. 05/21/22   Loyola Mast, MD  thiamine 100 MG tablet Take 1 tablet (100 mg total) by mouth daily. Patient not taking: Reported on 08/17/2023 02/02/22   Rhetta Mura, MD  TYLENOL 8 HOUR ARTHRITIS PAIN 650 MG CR tablet Take 650 mg by mouth every 8 (eight) hours as needed for pain. Patient not taking: Reported on 08/17/2023    [provider]  venlafaxine XR (EFFEXOR XR) 75 MG 24 hr capsule Take 1 capsule (75 mg total) by mouth daily with breakfast. Patient not taking: Reported on 08/17/2023 05/21/22   Loyola Mast, MD      Allergies    Dilaudid [hydromorphone hcl] and Morphine and codeine    Review of Systems   Review of Systems  All other systems reviewed and are negative.   Physical Exam Updated Vital Signs BP (!) 127/105 (BP Location: Right Arm)   Pulse 92   Temp 98.1 F (36.7 C) (Oral)   Resp 18   SpO2 98%  Physical Exam Vitals and nursing note reviewed.  Constitutional:      General: She is not in acute distress.    Appearance: Normal appearance. She is well-developed.  HENT:  Head: Normocephalic and atraumatic.  Eyes:     Conjunctiva/sclera: Conjunctivae normal.     Pupils: Pupils are equal, round, and reactive to light.  Cardiovascular:     Rate and Rhythm: Normal rate and regular rhythm.     Heart sounds: Normal heart sounds.  Pulmonary:     Effort: Pulmonary effort is normal. No respiratory distress.     Breath sounds: Normal breath sounds.  Abdominal:     General: There is no distension.     Palpations: Abdomen is soft.     Tenderness: There is no abdominal tenderness.  Musculoskeletal:         General: No deformity. Normal range of motion.     Cervical back: Normal range of motion and neck supple.  Skin:    General: Skin is warm and dry.  Neurological:     General: No focal deficit present.     Mental Status: She is alert and oriented to person, place, and time.     ED Results / Procedures / Treatments   Labs (all labs ordered are listed, but only abnormal results are displayed) Labs Reviewed - No data to display  EKG None  Radiology No results found.  Procedures Procedures  {Document cardiac monitor, telemetry assessment procedure when appropriate:1}  Medications Ordered in ED Medications - No data to display  ED Course/ Medical Decision Making/ A&P   {   Click here for ABCD2, HEART and other calculatorsREFRESH Note before signing :1}                              Medical Decision Making Amount and/or Complexity of Data Reviewed Labs: ordered. Radiology: ordered.  Risk Prescription drug management.    Medical Screen Complete  This patient presented to the ED with complaint of AMS, shortness of breath.  This complaint involves an extensive number of treatment options. The initial differential diagnosis includes, but is not limited to, metabolic abnormality, illicit drug use, infection, etc.  This presentation is: Acute, Self-Limited, Previously Undiagnosed, Uncertain Prognosis, Complicated, Systemic Symptoms, and Threat to Life/Bodily Function  Patient is presenting for apparent alteration in mental status.  Patient also with some bronchospasm with EMS.  EMS is administered albuterol and Atrovent.  Lungs are clear after treatment by EMS.  Screening labs ordered.  Screening imaging ordered.  Oncoming EDP aware of case.   Co morbidities that complicated the patient's evaluation  See HPI   Additional history obtained: External records from outside sources obtained and reviewed including prior ED visits and prior Inpatient records.    Lab  Tests:  I ordered and personally interpreted labs.  The pertinent results include:  cbc cmp inr trop valproic acid ua urine tox   Imaging Studies ordered:  I ordered imaging studies including CXR, CT Head, CTA PE   I agree with the radiologist interpretation.   Cardiac Monitoring:  The patient was maintained on a cardiac monitor.  I personally viewed and interpreted the cardiac monitor which showed an underlying rhythm of: NSR   Problem List / ED Course:  AMS   Reevaluation:  After the interventions noted above, I reevaluated the patient and found that they have: stayed the same    Disposition:  After consideration of the diagnostic results and the patients response to treatment, I feel that the patent would benefit from ***.    {Document critical care time when appropriate:1} {Document review of labs and clinical decision  tools ie heart score, Chads2Vasc2 etc:1}  {Document your independent review of radiology images, and any outside records:1} {Document your discussion with family members, caretakers, and with consultants:1} {Document social determinants of health affecting pt's care:1} {Document your decision making why or why not admission, treatments were needed:1} Final Clinical Impression(s) / ED Diagnoses Final diagnoses:  None    Rx / DC Orders ED Discharge Orders     None

## 2023-11-02 NOTE — ED Provider Notes (Signed)
 Received patient in turnover from Dr. Rodena Medin.  Please see their note for further details of Hx, PE.  Briefly patient is a 58 y.o. female with a Shortness of Breath .  Patient with AMS. Plan for CT head and CTa chest.  If negative likely home.  CT head without acute intercranial hemorrhage.  CT of the chest without obvious acute finding.  Discussed results with the patient.  Patient would like to try and go home.    Melene Plan, DO 11/02/23 1705

## 2023-11-04 ENCOUNTER — Emergency Department (HOSPITAL_COMMUNITY): Payer: Medicare (Managed Care)

## 2023-11-04 ENCOUNTER — Other Ambulatory Visit: Payer: Self-pay

## 2023-11-04 ENCOUNTER — Emergency Department (HOSPITAL_COMMUNITY)
Admission: EM | Admit: 2023-11-04 | Discharge: 2023-11-05 | Disposition: A | Discharge status: Home / Self Care | Payer: Medicare (Managed Care) | Attending: Emergency Medicine | Admitting: Emergency Medicine

## 2023-11-04 ENCOUNTER — Encounter (HOSPITAL_COMMUNITY): Payer: Self-pay | Admitting: Emergency Medicine

## 2023-11-04 DIAGNOSIS — Z853 Personal history of malignant neoplasm of breast: Secondary | ICD-10-CM | POA: Diagnosis not present

## 2023-11-04 DIAGNOSIS — R410 Disorientation, unspecified: Secondary | ICD-10-CM | POA: Diagnosis not present

## 2023-11-04 DIAGNOSIS — R062 Wheezing: Secondary | ICD-10-CM | POA: Diagnosis not present

## 2023-11-04 DIAGNOSIS — Z85841 Personal history of malignant neoplasm of brain: Secondary | ICD-10-CM | POA: Diagnosis not present

## 2023-11-04 DIAGNOSIS — R4182 Altered mental status, unspecified: Secondary | ICD-10-CM | POA: Diagnosis present

## 2023-11-04 LAB — CBC WITH DIFFERENTIAL/PLATELET
Abs Immature Granulocytes: 0.01 10*3/uL (ref 0.00–0.07)
Basophils Absolute: 0 10*3/uL (ref 0.0–0.1)
Basophils Relative: 1 %
Eosinophils Absolute: 0.1 10*3/uL (ref 0.0–0.5)
Eosinophils Relative: 2 %
HCT: 38.1 % (ref 36.0–46.0)
Hemoglobin: 11.8 g/dL — ABNORMAL LOW (ref 12.0–15.0)
Immature Granulocytes: 0 %
Lymphocytes Relative: 27 %
Lymphs Abs: 1 10*3/uL (ref 0.7–4.0)
MCH: 28.6 pg (ref 26.0–34.0)
MCHC: 31 g/dL (ref 30.0–36.0)
MCV: 92.5 fL (ref 80.0–100.0)
Monocytes Absolute: 0.6 10*3/uL (ref 0.1–1.0)
Monocytes Relative: 16 %
Neutro Abs: 2.1 10*3/uL (ref 1.7–7.7)
Neutrophils Relative %: 54 %
Platelets: 185 10*3/uL (ref 150–400)
RBC: 4.12 MIL/uL (ref 3.87–5.11)
RDW: 13.6 % (ref 11.5–15.5)
WBC: 3.9 10*3/uL — ABNORMAL LOW (ref 4.0–10.5)
nRBC: 0 % (ref 0.0–0.2)

## 2023-11-04 LAB — COMPREHENSIVE METABOLIC PANEL
ALT: 12 U/L (ref 0–44)
AST: 20 U/L (ref 15–41)
Albumin: 3.5 g/dL (ref 3.5–5.0)
Alkaline Phosphatase: 71 U/L (ref 38–126)
Anion gap: 8 (ref 5–15)
BUN: 15 mg/dL (ref 6–20)
CO2: 30 mmol/L (ref 22–32)
Calcium: 8.8 mg/dL — ABNORMAL LOW (ref 8.9–10.3)
Chloride: 102 mmol/L (ref 98–111)
Creatinine, Ser: 0.85 mg/dL (ref 0.44–1.00)
GFR, Estimated: >60 mL/min (ref >60)
Glucose, Bld: 98 mg/dL (ref 70–99)
Potassium: 3.7 mmol/L (ref 3.5–5.1)
Sodium: 140 mmol/L (ref 135–145)
Total Bilirubin: 1.1 mg/dL (ref 0.0–1.2)
Total Protein: 7.4 g/dL (ref 6.5–8.1)

## 2023-11-04 LAB — URINALYSIS, W/ REFLEX TO CULTURE (INFECTION SUSPECTED)
Bilirubin Urine: NEGATIVE
Glucose, UA: NEGATIVE mg/dL
Hgb urine dipstick: NEGATIVE
Ketones, ur: NEGATIVE mg/dL
Leukocytes,Ua: NEGATIVE
Nitrite: NEGATIVE
Protein, ur: NEGATIVE mg/dL
Specific Gravity, Urine: 1.028 (ref 1.005–1.030)
pH: 5 (ref 5.0–8.0)

## 2023-11-04 LAB — RESP PANEL BY RT-PCR (RSV, FLU A&B, COVID)  RVPGX2
Influenza A by PCR: NEGATIVE
Influenza B by PCR: NEGATIVE
Resp Syncytial Virus by PCR: NEGATIVE
SARS Coronavirus 2 by RT PCR: NEGATIVE

## 2023-11-04 LAB — TROPONIN I (HIGH SENSITIVITY): Troponin I (High Sensitivity): 3 ng/L (ref <18)

## 2023-11-04 LAB — VALPROIC ACID LEVEL: Valproic Acid Lvl: <10 ug/mL — ABNORMAL LOW (ref 50.0–100.0)

## 2023-11-04 MED ORDER — IPRATROPIUM BROMIDE 0.02 % IN SOLN
0.5000 mg | Freq: Once | RESPIRATORY_TRACT | Status: AC
  Administered 2023-11-04: 0.5 mg via RESPIRATORY_TRACT
  Filled 2023-11-04: qty 2.5

## 2023-11-04 MED ORDER — ALBUTEROL SULFATE (2.5 MG/3ML) 0.083% IN NEBU
5.0000 mg | INHALATION_SOLUTION | Freq: Once | RESPIRATORY_TRACT | Status: AC
Start: 2023-11-04 — End: 2023-11-04
  Administered 2023-11-04: 5 mg via RESPIRATORY_TRACT
  Filled 2023-11-04: qty 6

## 2023-11-04 NOTE — ED Provider Notes (Signed)
 [place] EMERGENCY DEPARTMENT AT [facility] Provider Note   CSN: [id number] Arrival date & time: 11/04/23  2036     History  Chief Complaint  Patient presents with   Altered Mental Status   Shortness of Breath    Monica Mccoy is a 58 y.o. female.  58 year old female presents from home after a fall.  No reported history of head trauma from this.  .  Some cough has been reported but no fever.  No vomiting or diarrhea noted.  No recent medication changes noted.  EMS was called and patient was noted to have wheezing.  Patient has a history of metastatic breast cancer to the brain.  Patient seen here for similar symptoms 2 days ago.  Had extensive workup done at that time including a CT of her chest as well as a head CT which did not show any acute findings.  She herself has no complaints at this time.       Home Medications Prior to Admission medications   Medication Sig Start Date End Date Taking? Authorizing Provider  amLODipine (NORVASC) 5 MG tablet Take 1 tablet (5 mg total) by mouth daily. 08/19/23 11/17/23  Pokhrel, Rebekah Chesterfield, MD  diclofenac Sodium (VOLTAREN) 1 % GEL Apply 4 g topically 4 (four) times daily. 08/19/23   Pokhrel, Rebekah Chesterfield, MD  divalproex (DEPAKOTE) 250 MG DR tablet Take 1 tablet (250 mg total) by mouth 2 (two) times daily. 05/21/22   Loyola Mast, MD  furosemide (LASIX) 20 MG tablet Take 20 mg by mouth in the morning.    [provider]  gabapentin (NEURONTIN) 300 MG capsule Take 2 capsules (600 mg total) by mouth 2 (two) times daily. 08/19/23 11/17/23  Pokhrel, Rebekah Chesterfield, MD  lisinopril (ZESTRIL) 10 MG tablet Take 1 tablet (10 mg total) by mouth daily. Patient not taking: Reported on 08/17/2023 05/21/22   Loyola Mast, MD  MELATONIN GUMMIES PO Take 10 mg by mouth at bedtime as needed (for sleep). Patient not taking: Reported on 08/17/2023    [provider]  mirabegron ER (MYRBETRIQ) 50 MG TB24 tablet Take 1 tablet (50 mg total) by  mouth daily. Patient not taking: Reported on 08/17/2023 05/21/22   Loyola Mast, MD  pantoprazole (PROTONIX) 40 MG tablet Take 1 tablet (40 mg total) by mouth daily. Patient taking differently: Take 40 mg by mouth daily before breakfast. 05/21/22   Loyola Mast, MD  thiamine 100 MG tablet Take 1 tablet (100 mg total) by mouth daily. Patient not taking: Reported on 08/17/2023 02/02/22   Rhetta Mura, MD  TYLENOL 8 HOUR ARTHRITIS PAIN 650 MG CR tablet Take 650 mg by mouth every 8 (eight) hours as needed for pain. Patient not taking: Reported on 08/17/2023    [provider]  venlafaxine XR (EFFEXOR XR) 75 MG 24 hr capsule Take 1 capsule (75 mg total) by mouth daily with breakfast. Patient not taking: Reported on 08/17/2023 05/21/22   Loyola Mast, MD      Allergies    Dilaudid [hydromorphone hcl] and Morphine and codeine    Review of Systems   Review of Systems  Unable to perform ROS: Mental status change    Physical Exam Updated Vital Signs BP (!) 123/93 (BP Location: Right Arm)   Pulse 73   Temp 97.8 F (36.6 C) (Oral)   Resp 15   SpO2 96%  Physical Exam Vitals and nursing note reviewed.  Constitutional:      General: She is  not in acute distress.    Appearance: Normal appearance. She is well-developed. She is not toxic-appearing.  HENT:     Head: Normocephalic and atraumatic.  Eyes:     General: Lids are normal.     Conjunctiva/sclera: Conjunctivae normal.     Pupils: Pupils are equal, round, and reactive to light.  Neck:     Thyroid: No thyroid mass.     Trachea: No tracheal deviation.  Cardiovascular:     Rate and Rhythm: Normal rate and regular rhythm.     Heart sounds: Normal heart sounds. No murmur heard.    No gallop.  Pulmonary:     Effort: Pulmonary effort is normal. No respiratory distress.     Breath sounds: Normal breath sounds. No stridor. No decreased breath sounds, wheezing, rhonchi or rales.  Abdominal:     General: There is no  distension.     Palpations: Abdomen is soft.     Tenderness: There is no abdominal tenderness. There is no rebound.  Musculoskeletal:        General: No tenderness. Normal range of motion.     Cervical back: Normal range of motion and neck supple.  Skin:    General: Skin is warm and dry.     Findings: No abrasion or rash.  Neurological:     General: No focal deficit present.     Mental Status: She is alert. She is disoriented.     GCS: GCS eye subscore is 4. GCS verbal subscore is 5. GCS motor subscore is 6.     Cranial Nerves: No cranial nerve deficit.     Sensory: No sensory deficit.     Motor: Motor function is intact. No weakness or tremor.  Psychiatric:        Attention and Perception: Attention normal.        Mood and Affect: Affect is blunt and flat.     ED Results / Procedures / Treatments   Labs (all labs ordered are listed, but only abnormal results are displayed) Labs Reviewed  RESP PANEL BY RT-PCR (RSV, FLU A&B, COVID)  RVPGX2  VALPROIC ACID LEVEL  CBC WITH DIFFERENTIAL/PLATELET  COMPREHENSIVE METABOLIC PANEL  URINALYSIS, W/ REFLEX TO CULTURE (INFECTION SUSPECTED)    EKG None  Radiology No results found.  Procedures Procedures    Medications Ordered in ED Medications  albuterol (PROVENTIL) (2.5 MG/3ML) 0.083% nebulizer solution 5 mg (has no administration in time range)  ipratropium (ATROVENT) nebulizer solution 0.5 mg (has no administration in time range)    ED Course/ Medical Decision Making/ A&P                                 Medical Decision Making Amount and/or Complexity of Data Reviewed Labs: ordered. Radiology: ordered.  Risk Prescription drug management.   Chest x-ray and head CT are without acute findings at this time.  COVID and flu test negative.  Labs otherwise reassuring.  Patient's mental status is normal at this time.  Will discharge back to home        Final Clinical Impression(s) / ED Diagnoses Final diagnoses:   None    Rx / DC Orders ED Discharge Orders     None         Lorre Nick, MD 11/04/23 2343

## 2023-11-04 NOTE — ED Triage Notes (Signed)
 Pt BIBA from home, presents with AMS, SHOB with audible wheezing, EMS was called out by family for a fall. Family reports pt did not hit her head. No meds given PTA for SHOB/wheezing. 95% RA. Pt alert and oriented to self only.

## 2023-11-04 NOTE — ED Notes (Signed)
 Nurse asked pt to state where she was, pt stated "I don't know". Pt able to tell nurse her full name and DOB. Nurse asked pt if she could tell the current year, pt stated that the year was 88.

## 2023-11-05 NOTE — ED Notes (Signed)
 Pt has been transported by [facility] to residence.

## 2023-11-07 LAB — CULTURE, BLOOD (ROUTINE X 2)
Culture: NO GROWTH
Special Requests: ADEQUATE

## 2024-01-23 ENCOUNTER — Encounter (HOSPITAL_COMMUNITY): Payer: Self-pay

## 2024-01-23 ENCOUNTER — Emergency Department (HOSPITAL_COMMUNITY)
Admission: EM | Admit: 2024-01-23 | Discharge: 2024-01-23 | Disposition: A | Discharge status: Home / Self Care | Payer: Medicare (Managed Care) | Attending: Emergency Medicine | Admitting: Emergency Medicine

## 2024-01-23 ENCOUNTER — Emergency Department (HOSPITAL_COMMUNITY): Payer: Medicare (Managed Care)

## 2024-01-23 ENCOUNTER — Other Ambulatory Visit: Payer: Self-pay

## 2024-01-23 DIAGNOSIS — Z853 Personal history of malignant neoplasm of breast: Secondary | ICD-10-CM | POA: Diagnosis not present

## 2024-01-23 DIAGNOSIS — R4182 Altered mental status, unspecified: Secondary | ICD-10-CM | POA: Diagnosis present

## 2024-01-23 DIAGNOSIS — C7931 Secondary malignant neoplasm of brain: Secondary | ICD-10-CM | POA: Diagnosis not present

## 2024-01-23 LAB — CBC WITH DIFFERENTIAL/PLATELET
Abs Immature Granulocytes: 0.01 10*3/uL (ref 0.00–0.07)
Basophils Absolute: 0 10*3/uL (ref 0.0–0.1)
Basophils Relative: 1 %
Eosinophils Absolute: 0.1 10*3/uL (ref 0.0–0.5)
Eosinophils Relative: 2 %
HCT: 38 % (ref 36.0–46.0)
Hemoglobin: 11.9 g/dL — ABNORMAL LOW (ref 12.0–15.0)
Immature Granulocytes: 0 %
Lymphocytes Relative: 18 %
Lymphs Abs: 0.9 10*3/uL (ref 0.7–4.0)
MCH: 28.9 pg (ref 26.0–34.0)
MCHC: 31.3 g/dL (ref 30.0–36.0)
MCV: 92.2 fL (ref 80.0–100.0)
Monocytes Absolute: 0.8 10*3/uL (ref 0.1–1.0)
Monocytes Relative: 16 %
Neutro Abs: 3.2 10*3/uL (ref 1.7–7.7)
Neutrophils Relative %: 63 %
Platelets: 224 10*3/uL (ref 150–400)
RBC: 4.12 MIL/uL (ref 3.87–5.11)
RDW: 13.8 % (ref 11.5–15.5)
WBC: 5.1 10*3/uL (ref 4.0–10.5)
nRBC: 0 % (ref 0.0–0.2)

## 2024-01-23 LAB — URINALYSIS, ROUTINE W REFLEX MICROSCOPIC
Bilirubin Urine: NEGATIVE
Glucose, UA: NEGATIVE mg/dL
Hgb urine dipstick: NEGATIVE
Ketones, ur: NEGATIVE mg/dL
Leukocytes,Ua: NEGATIVE
Nitrite: NEGATIVE
Protein, ur: NEGATIVE mg/dL
Specific Gravity, Urine: 1.014 (ref 1.005–1.030)
pH: 8 (ref 5.0–8.0)

## 2024-01-23 LAB — BASIC METABOLIC PANEL WITH GFR
Anion gap: 8 (ref 5–15)
BUN: 8 mg/dL (ref 6–20)
CO2: 28 mmol/L (ref 22–32)
Calcium: 9.3 mg/dL (ref 8.9–10.3)
Chloride: 103 mmol/L (ref 98–111)
Creatinine, Ser: 0.8 mg/dL (ref 0.44–1.00)
GFR, Estimated: >60 mL/min (ref >60)
Glucose, Bld: 97 mg/dL (ref 70–99)
Potassium: 4 mmol/L (ref 3.5–5.1)
Sodium: 139 mmol/L (ref 135–145)

## 2024-01-23 NOTE — Discharge Instructions (Signed)
 Return for any problem.  ?

## 2024-01-23 NOTE — ED Provider Notes (Signed)
  EMERGENCY DEPARTMENT AT [facility] Provider Note   CSN: [id number] Arrival date & time: 01/23/24  1351     History  Chief Complaint  Patient presents with   Altered Mental Status    Monica Mccoy is a 58 y.o. female.  With a history of breast cancer with mets to brain s/p resection, acute metabolic encephalopathy and urinary tract infections who presents to ED given concern for altered mental status.  Patient lives at home with her sister who voiced concern for increased confusion from patient's baseline today.  No fevers chills vomiting or diarrhea.  Patient herself is awake alert oriented to self able to answer questions.  She has no systemic complaints at this time   Altered Mental Status      Home Medications Prior to Admission medications   Medication Sig Start Date End Date Taking? Authorizing Provider  amLODipine  (NORVASC ) 5 MG tablet Take 1 tablet (5 mg total) by mouth daily. 08/19/23 11/17/23  Pokhrel, Laxman, MD  diclofenac  Sodium (VOLTAREN ) 1 % GEL Apply 4 g topically 4 (four) times daily. 08/19/23   Pokhrel, Amador Bad, MD  divalproex  (DEPAKOTE ) 250 MG DR tablet Take 1 tablet (250 mg total) by mouth 2 (two) times daily. 05/21/22   Graig Lawyer, MD  furosemide  (LASIX ) 20 MG tablet Take 20 mg by mouth in the morning.    [provider]  gabapentin  (NEURONTIN ) 300 MG capsule Take 2 capsules (600 mg total) by mouth 2 (two) times daily. 08/19/23 11/17/23  Pokhrel, Laxman, MD  lisinopril  (ZESTRIL ) 10 MG tablet Take 1 tablet (10 mg total) by mouth daily. Patient not taking: Reported on 08/17/2023 05/21/22   Graig Lawyer, MD  MELATONIN GUMMIES PO Take 10 mg by mouth at bedtime as needed (for sleep). Patient not taking: Reported on 08/17/2023    [provider]  mirabegron  ER (MYRBETRIQ ) 50 MG TB24 tablet Take 1 tablet (50 mg total) by mouth daily. Patient not taking: Reported on 08/17/2023 05/21/22   Graig Lawyer, MD  pantoprazole   (PROTONIX ) 40 MG tablet Take 1 tablet (40 mg total) by mouth daily. Patient taking differently: Take 40 mg by mouth daily before breakfast. 05/21/22   Graig Lawyer, MD  thiamine  100 MG tablet Take 1 tablet (100 mg total) by mouth daily. Patient not taking: Reported on 08/17/2023 02/02/22   Samtani, Jai-Gurmukh, MD  TYLENOL  8 HOUR ARTHRITIS PAIN 650 MG CR tablet Take 650 mg by mouth every 8 (eight) hours as needed for pain. Patient not taking: Reported on 08/17/2023    [provider]  venlafaxine  XR (EFFEXOR  XR) 75 MG 24 hr capsule Take 1 capsule (75 mg total) by mouth daily with breakfast. Patient not taking: Reported on 08/17/2023 05/21/22   Graig Lawyer, MD      Allergies    Dilaudid [hydromorphone hcl] and Morphine and codeine    Review of Systems   Review of Systems  Physical Exam Updated Vital Signs BP (!) 155/126   Pulse 72   Temp 97.9 F (36.6 C) (Oral)   Resp 18   SpO2 98%  Physical Exam Vitals and nursing note reviewed.  Constitutional:      Appearance: She is obese.  HENT:     Head: Normocephalic and atraumatic.  Eyes:     Pupils: Pupils are equal, round, and reactive to light.  Cardiovascular:     Rate and Rhythm: Normal rate and regular rhythm.  Pulmonary:     Effort: Pulmonary  effort is normal.     Breath sounds: Normal breath sounds.  Abdominal:     Palpations: Abdomen is soft.     Tenderness: There is no abdominal tenderness.  Skin:    General: Skin is warm and dry.  Neurological:     Mental Status: She is alert.     Motor: No weakness.     Comments: Oriented to self only  Psychiatric:        Mood and Affect: Mood normal.     ED Results / Procedures / Treatments   Labs (all labs ordered are listed, but only abnormal results are displayed) Labs Reviewed  CBC WITH DIFFERENTIAL/PLATELET - Abnormal; Notable for the following components:      Result Value   Hemoglobin 11.9 (*)    All other components within normal limits  BASIC  METABOLIC PANEL WITH GFR  URINALYSIS, ROUTINE W REFLEX MICROSCOPIC    EKG None  Radiology DG Chest Portable 1 View Result Date: 01/23/2024 CLINICAL DATA:  Altered mental status EXAM: PORTABLE CHEST 1 VIEW COMPARISON:  11/04/2023 FINDINGS: Left-sided central venous port tip over the brachiocephalic confluence. Stable cardiomediastinal silhouette. No acute airspace disease, pleural effusion or pneumothorax. Numerous clips in the right axilla IMPRESSION: No active disease. Electronically Signed   By: Esmeralda Hedge M.D.   On: 01/23/2024 17:40   CT Head Wo Contrast Result Date: 01/23/2024 CLINICAL DATA:  Mental status change, increased confusion. EXAM: CT HEAD WITHOUT CONTRAST TECHNIQUE: Contiguous axial images were obtained from the base of the skull through the vertex without intravenous contrast. RADIATION DOSE REDUCTION: This exam was performed according to the departmental dose-optimization program which includes automated exposure control, adjustment of the mA and/or kV according to patient size and/or use of iterative reconstruction technique. COMPARISON:  MRI head 06/12/2022.  CT head 11/04/2023. FINDINGS: Brain: No acute intracranial hemorrhage. Similar encephalomalacia in the left frontal lobe. Foci of mineralization within the pons and cerebellum are similar to prior. No edema, mass effect, or midline shift. The basilar cisterns are patent. Ventricles: Prominence of the ventricles suggesting underlying parenchymal volume loss. Vascular: No hyperdense vessel or unexpected calcification. Skull: Postsurgical changes of left frontal and left frontotemporal craniotomy. No acute or aggressive finding. Orbits: Left lens replacement.  Orbits otherwise symmetric. Sinuses: Mucosal thickening in the right sphenoid sinus. Other: Bilateral mastoid effusions again noted. IMPRESSION: No CT evidence of acute intracranial abnormality. Similar left frontotemporal craniotomies with underlying encephalomalacia in the  left frontal lobe. Bilateral mastoid effusions. Electronically Signed   By: Denny Flack M.D.   On: 01/23/2024 17:05    Procedures Procedures    Medications Ordered in ED Medications - No data to display  ED Course/ Medical Decision Making/ A&P Clinical Course as of 01/23/24 1751  Thu Jan 23, 2024  1750 No leukocytosis or significant abnormality on labs.  Chest x-ray CT head without acute changes.  Awaiting UA reevaluation disposition.  Teresia Fennel DO, am transitioning care of this patient to the oncoming provider pending UA reevaluation disposition [MP]    Clinical Course User Index [MP] Sallyanne Creamer, DO                                 Medical Decision Making 59 year old female with history as above presenting given concern for altered mental status.  Increased confusion from baseline.  Progressive worsening per patient's sister who looks after her.  Afebrile normotensive here.  No  overt signs of infection on exam.  No focal neurologic deficits.  Awake alert oriented to self only.  Will obtain infectious workup including labs UA chest x-ray.  Will also obtain CT head given history of breast cancer with mets to brain  Amount and/or Complexity of Data Reviewed Labs: ordered. Radiology: ordered.           Final Clinical Impression(s) / ED Diagnoses Final diagnoses:  Metastatic cancer to brain [place])  Altered mental status, unspecified altered mental status type    Rx / DC Orders ED Discharge Orders     None         Sallyanne Creamer, DO 01/23/24 1751

## 2024-01-23 NOTE — ED Triage Notes (Signed)
 EMS reports from home, Hx of brain CA, called out by sister states increased AMS. Sister states declining over past several months and confused at baseline. Pt denies pain or other issue.  BP 140/90 HR 78 RR 16 Sp02 100 RA CBG 128

## 2024-01-23 NOTE — ED Provider Notes (Signed)
 Patient seen after prior ED provider.  Patient is appropriate for discharge.  Importance of close follow-up was stressed.     Burnette Carte, MD 01/23/24 [phone]

## 2024-01-23 NOTE — ED Notes (Signed)
 Sister was made aware of discharge. Stated would need [facility] to get patient back home. She stated she had to leave she has work. Gave me her number. Patients sister Lynnie Saucier was the one I spoke with here.

## 2024-04-03 ENCOUNTER — Other Ambulatory Visit: Payer: Self-pay

## 2024-04-03 ENCOUNTER — Emergency Department (HOSPITAL_COMMUNITY): Payer: Medicare (Managed Care)

## 2024-04-03 ENCOUNTER — Inpatient Hospital Stay (HOSPITAL_COMMUNITY)
Admission: EM | Admit: 2024-04-03 | Discharge: 2024-04-21 | DRG: 689 | Disposition: A | Discharge status: Skilled Nursing Facility | Payer: Medicare (Managed Care) | Attending: Internal Medicine | Admitting: Internal Medicine

## 2024-04-03 ENCOUNTER — Encounter (HOSPITAL_COMMUNITY): Payer: Self-pay | Admitting: *Deleted

## 2024-04-03 DIAGNOSIS — Z5941 Food insecurity: Secondary | ICD-10-CM

## 2024-04-03 DIAGNOSIS — R5381 Other malaise: Secondary | ICD-10-CM | POA: Diagnosis present

## 2024-04-03 DIAGNOSIS — C50919 Malignant neoplasm of unspecified site of unspecified female breast: Secondary | ICD-10-CM

## 2024-04-03 DIAGNOSIS — Z79899 Other long term (current) drug therapy: Secondary | ICD-10-CM

## 2024-04-03 DIAGNOSIS — Z83438 Family history of other disorder of lipoprotein metabolism and other lipidemia: Secondary | ICD-10-CM

## 2024-04-03 DIAGNOSIS — Z9013 Acquired absence of bilateral breasts and nipples: Secondary | ICD-10-CM

## 2024-04-03 DIAGNOSIS — E876 Hypokalemia: Secondary | ICD-10-CM | POA: Diagnosis present

## 2024-04-03 DIAGNOSIS — N390 Urinary tract infection, site not specified: Secondary | ICD-10-CM | POA: Diagnosis not present

## 2024-04-03 DIAGNOSIS — G8929 Other chronic pain: Secondary | ICD-10-CM

## 2024-04-03 DIAGNOSIS — Z8669 Personal history of other diseases of the nervous system and sense organs: Secondary | ICD-10-CM

## 2024-04-03 DIAGNOSIS — E66813 Obesity, class 3: Secondary | ICD-10-CM | POA: Diagnosis present

## 2024-04-03 DIAGNOSIS — F331 Major depressive disorder, recurrent, moderate: Secondary | ICD-10-CM | POA: Diagnosis present

## 2024-04-03 DIAGNOSIS — K219 Gastro-esophageal reflux disease without esophagitis: Secondary | ICD-10-CM | POA: Diagnosis present

## 2024-04-03 DIAGNOSIS — Z853 Personal history of malignant neoplasm of breast: Secondary | ICD-10-CM

## 2024-04-03 DIAGNOSIS — Z751 Person awaiting admission to adequate facility elsewhere: Secondary | ICD-10-CM

## 2024-04-03 DIAGNOSIS — Z8249 Family history of ischemic heart disease and other diseases of the circulatory system: Secondary | ICD-10-CM

## 2024-04-03 DIAGNOSIS — G9341 Metabolic encephalopathy: Secondary | ICD-10-CM | POA: Diagnosis present

## 2024-04-03 DIAGNOSIS — C7931 Secondary malignant neoplasm of brain: Secondary | ICD-10-CM | POA: Diagnosis present

## 2024-04-03 DIAGNOSIS — G40909 Epilepsy, unspecified, not intractable, without status epilepticus: Secondary | ICD-10-CM | POA: Diagnosis present

## 2024-04-03 DIAGNOSIS — G620 Drug-induced polyneuropathy: Secondary | ICD-10-CM | POA: Diagnosis present

## 2024-04-03 DIAGNOSIS — R32 Unspecified urinary incontinence: Secondary | ICD-10-CM

## 2024-04-03 DIAGNOSIS — Z6841 Body Mass Index (BMI) 40.0 and over, adult: Secondary | ICD-10-CM

## 2024-04-03 DIAGNOSIS — I110 Hypertensive heart disease with heart failure: Secondary | ICD-10-CM | POA: Diagnosis present

## 2024-04-03 DIAGNOSIS — I10 Essential (primary) hypertension: Secondary | ICD-10-CM | POA: Diagnosis present

## 2024-04-03 DIAGNOSIS — I272 Pulmonary hypertension, unspecified: Secondary | ICD-10-CM | POA: Diagnosis present

## 2024-04-03 DIAGNOSIS — R52 Pain, unspecified: Secondary | ICD-10-CM

## 2024-04-03 DIAGNOSIS — I5032 Chronic diastolic (congestive) heart failure: Secondary | ICD-10-CM | POA: Diagnosis present

## 2024-04-03 DIAGNOSIS — B9620 Unspecified Escherichia coli [E. coli] as the cause of diseases classified elsewhere: Secondary | ICD-10-CM | POA: Diagnosis present

## 2024-04-03 DIAGNOSIS — Z7401 Bed confinement status: Secondary | ICD-10-CM

## 2024-04-03 DIAGNOSIS — Z833 Family history of diabetes mellitus: Secondary | ICD-10-CM

## 2024-04-03 DIAGNOSIS — C50911 Malignant neoplasm of unspecified site of right female breast: Secondary | ICD-10-CM

## 2024-04-03 DIAGNOSIS — Z9221 Personal history of antineoplastic chemotherapy: Secondary | ICD-10-CM

## 2024-04-03 DIAGNOSIS — T451X5A Adverse effect of antineoplastic and immunosuppressive drugs, initial encounter: Secondary | ICD-10-CM | POA: Diagnosis present

## 2024-04-03 DIAGNOSIS — Z5982 Transportation insecurity: Secondary | ICD-10-CM

## 2024-04-03 LAB — COMPREHENSIVE METABOLIC PANEL WITH GFR
ALT: 12 U/L (ref 0–44)
AST: 15 U/L (ref 15–41)
Albumin: 3.6 g/dL (ref 3.5–5.0)
Alkaline Phosphatase: 82 U/L (ref 38–126)
Anion gap: 8 (ref 5–15)
BUN: 10 mg/dL (ref 6–20)
CO2: 27 mmol/L (ref 22–32)
Calcium: 9.1 mg/dL (ref 8.9–10.3)
Chloride: 105 mmol/L (ref 98–111)
Creatinine, Ser: 0.69 mg/dL (ref 0.44–1.00)
GFR, Estimated: >60 mL/min (ref >60)
Glucose, Bld: 105 mg/dL — ABNORMAL HIGH (ref 70–99)
Potassium: 3.4 mmol/L — ABNORMAL LOW (ref 3.5–5.1)
Sodium: 140 mmol/L (ref 135–145)
Total Bilirubin: 1 mg/dL (ref 0.0–1.2)
Total Protein: 7.5 g/dL (ref 6.5–8.1)

## 2024-04-03 LAB — URINALYSIS, ROUTINE W REFLEX MICROSCOPIC
Bilirubin Urine: NEGATIVE
Glucose, UA: NEGATIVE mg/dL
Ketones, ur: NEGATIVE mg/dL
Nitrite: POSITIVE — AB
Protein, ur: 30 mg/dL — AB
Specific Gravity, Urine: 1.025 (ref 1.005–1.030)
WBC, UA: >50 WBC/hpf (ref 0–5)
pH: 5 (ref 5.0–8.0)

## 2024-04-03 LAB — CBC
HCT: 42.6 % (ref 36.0–46.0)
Hemoglobin: 13.1 g/dL (ref 12.0–15.0)
MCH: 28 pg (ref 26.0–34.0)
MCHC: 30.8 g/dL (ref 30.0–36.0)
MCV: 91 fL (ref 80.0–100.0)
Platelets: 194 K/uL (ref 150–400)
RBC: 4.68 MIL/uL (ref 3.87–5.11)
RDW: 13.6 % (ref 11.5–15.5)
WBC: 3.6 K/uL — ABNORMAL LOW (ref 4.0–10.5)
nRBC: 0 % (ref 0.0–0.2)

## 2024-04-03 LAB — CBG MONITORING, ED: Glucose-Capillary: 95 mg/dL (ref 70–99)

## 2024-04-03 LAB — AMMONIA: Ammonia: 25 umol/L (ref 9–35)

## 2024-04-03 LAB — VALPROIC ACID LEVEL: Valproic Acid Lvl: <10 ug/mL — ABNORMAL LOW (ref 50–100)

## 2024-04-03 MED ORDER — SODIUM CHLORIDE 0.9 % IV SOLN
1.0000 g | Freq: Once | INTRAVENOUS | Status: AC
  Administered 2024-04-03: 1 g via INTRAVENOUS
  Filled 2024-04-03: qty 10

## 2024-04-03 MED ORDER — POTASSIUM CHLORIDE CRYS ER 20 MEQ PO TBCR: 40.0000 meq | EXTENDED_RELEASE_TABLET | Freq: Once | ORAL | Status: DC

## 2024-04-03 MED ORDER — AMLODIPINE BESYLATE 5 MG PO TABS
5.0000 mg | ORAL_TABLET | Freq: Once | ORAL | Status: AC
  Administered 2024-04-03: 5 mg via ORAL
  Filled 2024-04-03: qty 1

## 2024-04-03 MED ORDER — SODIUM CHLORIDE 0.9 % IV SOLN
2.0000 g | INTRAVENOUS | Status: DC
  Administered 2024-04-04 – 2024-04-07 (×6): 2 g via INTRAVENOUS
  Filled 2024-04-03 (×4): qty 20

## 2024-04-03 MED ORDER — SODIUM CHLORIDE 0.9 % IV SOLN: 2.0000 g | INTRAVENOUS | Status: DC

## 2024-04-03 NOTE — H&P (Signed)
 History and Physical    Monica Mccoy:[id number] DOB: 1966-08-15 DOA: 04/03/2024  I have briefly reviewed the patient's prior medical records in [facility]  PCP: Pcp, No  Patient coming from: home  Chief Complaint: confusion  HPI: Monica Mccoy is a 58 y.o. female with medical history significant of breast cancer with brain mets, apparently in remission, cognitive impairment, morbid obesity, hypertension, chronic diastolic CHF comes to the hospital with increased confusion over the last 1 to 2 weeks.  Apparently she has been having increased urination episodes, urine has been foul-smelling for few days.  On my evaluation patient appears confused, she tells me I am feeling disturbed, but cannot elaborate.  When asked where she is, location wise, she said [place].  She remembers being here brought by ambulance, and that [place] called EMS, but she is unable to tell me how [name] is.  ED Course: In the ED she is afebrile, blood pressure in the 140s, satting well on room air.  Review of Systems: Unable to obtain comprehensive review of systems due to encephalopathy blood work reveals white count of 3.6, potassium 3.4.  Urinalysis with gross evidence of an infection.  CT of the brain unremarkable.  She was given ceftriaxone , and due to ongoing encephalopathy in the setting of an infection, we are asked to admit  Past Medical History:  Diagnosis Date   Anxiety    Breast cancer metastasized to brain [facility])    Left breast   Chronic pain after cancer treatment    Depression    GERD (gastroesophageal reflux disease)    History of cancer chemotherapy    Breast cancer left breast   Seizure [facility])     Past Surgical History:  Procedure Laterality Date   BRAIN SURGERY     brain tumor   MASTECTOMY Bilateral    ORIF ANKLE FRACTURE Right      reports that she has never smoked. She has never used smokeless tobacco. She reports current alcohol use. She reports current drug use. Drug:  Marijuana.  Allergies  Allergen Reactions   Dilaudid [Hydromorphone Hcl] Other (See Comments)    Confusion & hallucinations   Morphine And Codeine Other (See Comments)    Headaches     Family History  Problem Relation Age of Onset   Obesity Mother    Hypertension Mother    Hyperlipidemia Mother    Diabetes Mother    Heart disease Mother     Prior to Admission medications   Medication Sig Start Date End Date Taking? Authorizing Provider  amLODipine  (NORVASC ) 5 MG tablet Take 1 tablet (5 mg total) by mouth daily. 08/19/23 11/17/23  Pokhrel, Vernal, MD  diclofenac  Sodium (VOLTAREN ) 1 % GEL Apply 4 g topically 4 (four) times daily. 08/19/23   Pokhrel, Vernal, MD  divalproex  (DEPAKOTE ) 250 MG DR tablet Take 1 tablet (250 mg total) by mouth 2 (two) times daily. 05/21/22   Thedora Garnette HERO, MD  furosemide  (LASIX ) 20 MG tablet Take 20 mg by mouth in the morning.    [provider]  gabapentin  (NEURONTIN ) 300 MG capsule Take 2 capsules (600 mg total) by mouth 2 (two) times daily. 08/19/23 11/17/23  Pokhrel, Laxman, MD  lisinopril  (ZESTRIL ) 10 MG tablet Take 1 tablet (10 mg total) by mouth daily. Patient not taking: Reported on 08/17/2023 05/21/22   Thedora Garnette HERO, MD  MELATONIN GUMMIES PO Take 10 mg by mouth at bedtime as needed (for sleep). Patient not taking: Reported on 08/17/2023  [provider]  mirabegron  ER (MYRBETRIQ ) 50 MG TB24 tablet Take 1 tablet (50 mg total) by mouth daily. Patient not taking: Reported on 08/17/2023 05/21/22   Thedora Garnette HERO, MD  pantoprazole  (PROTONIX ) 40 MG tablet Take 1 tablet (40 mg total) by mouth daily. Patient taking differently: Take 40 mg by mouth daily before breakfast. 05/21/22   Thedora Garnette HERO, MD  thiamine  100 MG tablet Take 1 tablet (100 mg total) by mouth daily. Patient not taking: Reported on 08/17/2023 02/02/22   Samtani, Jai-Gurmukh, MD  TYLENOL  8 HOUR ARTHRITIS PAIN 650 MG CR tablet Take 650 mg by mouth every 8 (eight) hours  as needed for pain. Patient not taking: Reported on 08/17/2023    [provider]  venlafaxine  XR (EFFEXOR  XR) 75 MG 24 hr capsule Take 1 capsule (75 mg total) by mouth daily with breakfast. Patient not taking: Reported on 08/17/2023 05/21/22   Thedora Garnette HERO, MD    Physical Exam: Vitals:   04/03/24 1555 04/03/24 1556 04/03/24 1557 04/03/24 1600  BP:  (!) 150/112 (!) 122/93 (!) 164/104  Pulse: 73   80  Resp: 16     Temp: 97.7 F (36.5 C)     TempSrc: Oral     SpO2: 100%   99%  Weight:      Height:        Constitutional: NAD, confused Eyes: PERRL, lids and conjunctivae normal ENMT: Mucous membranes are moist.  Neck: normal, supple Respiratory: clear to auscultation bilaterally, no wheezing, no crackles Cardiovascular: Regular rate and rhythm, no murmurs / rubs / gallops.  No edema Abdomen: Mild suprapubic tenderness, no guarding or rebound Musculoskeletal: no clubbing / cyanosis. Normal muscle tone.  Skin: no rashes, lesions, ulcers. No induration Neurologic: Nonfocal, alert to self  Labs on Admission: I have personally reviewed following labs and imaging studies  CBC: Recent Labs  Lab 04/03/24 1705  WBC 3.6*  HGB 13.1  HCT 42.6  MCV 91.0  PLT 194   Basic Metabolic Panel: Recent Labs  Lab 04/03/24 1705  NA 140  K 3.4*  CL 105  CO2 27  GLUCOSE 105*  BUN 10  CREATININE 0.69  CALCIUM  9.1   Liver Function Tests: Recent Labs  Lab 04/03/24 1705  AST 15  ALT 12  ALKPHOS 82  BILITOT 1.0  PROT 7.5  ALBUMIN 3.6   Coagulation Profile: No results for input(s): INR, PROTIME in the last 168 hours. BNP (last 3 results) No results for input(s): PROBNP in the last 8760 hours. CBG: Recent Labs  Lab 04/03/24 1630  GLUCAP 95   Thyroid  Function Tests: No results for input(s): TSH, T4TOTAL, FREET4, T3FREE, THYROIDAB in the last 72 hours. Urine analysis:    Component Value Date/Time   COLORURINE AMBER (A) 04/03/2024 1630    APPEARANCEUR HAZY (A) 04/03/2024 1630   LABSPEC 1.025 04/03/2024 1630   PHURINE 5.0 04/03/2024 1630   GLUCOSEU NEGATIVE 04/03/2024 1630   HGBUR SMALL (A) 04/03/2024 1630   BILIRUBINUR NEGATIVE 04/03/2024 1630   BILIRUBINUR negative 03/09/2022 1332   KETONESUR NEGATIVE 04/03/2024 1630   PROTEINUR 30 (A) 04/03/2024 1630   UROBILINOGEN 0.2 03/09/2022 1332   NITRITE POSITIVE (A) 04/03/2024 1630   LEUKOCYTESUR LARGE (A) 04/03/2024 1630     Radiological Exams on Admission: CT Head Wo Contrast Result Date: 04/03/2024 CLINICAL DATA:  Mental status change, unknown cause Metastatic disease evaluation AMS x 2 weeks, increased urination with odor since last night, some shob, history of brain cancer. 152/90-80-98%  RA-CBG 112  EXAM: CT HEAD WITHOUT CONTRAST TECHNIQUE: Contiguous axial images were obtained from the base of the skull through the vertex without intravenous contrast. RADIATION DOSE REDUCTION: This exam was performed according to the departmental dose-optimization program which includes automated exposure control, adjustment of the mA and/or kV according to patient size and/or use of iterative reconstruction technique. COMPARISON:  CT head 01/23/2024 FINDINGS: Brain: Cerebral ventricle sizes are concordant with the degree of cerebral volume loss. Patchy and confluent areas of decreased attenuation are noted throughout the deep and periventricular white matter of the cerebral hemispheres bilaterally, compatible with chronic microvascular ischemic disease. Similar-appearing left frontal encephalomalacia and resection. No evidence of large-territorial acute infarction. No parenchymal hemorrhage. No mass lesion. No extra-axial collection. No mass effect or midline shift. No hydrocephalus. Basilar cisterns are patent. Vascular: No hyperdense vessel. Skull: No acute fracture or focal lesion. Prior left frontal craniotomy. Sinuses/Orbits: Paranasal sinuses and mastoid air cells are clear. Left lens  replacement. Otherwise the orbits are unremarkable. Other: None. IMPRESSION: No acute intracranial abnormality.Similar-appearing left frontal encephalomalacia and resection. Please note CT is limited for evaluation of recurrent or metastatic masses, consider MRI with and without contrast for further evaluation. Electronically Signed   By: Morgane  Naveau M.D.   On: 04/03/2024 17:22   DG Chest Portable 1 View Result Date: 04/03/2024 EXAM: 1 VIEW XRAY OF THE CHEST 04/03/2024 04:29:00 PM COMPARISON: 01/23/2023 CLINICAL HISTORY: AMS. AMS x 2 weeks, increased urination with odor since last night, some shob, history of brain cancer. FINDINGS: LUNGS AND PLEURA: Low lung volumes with some crowding of bibasilar bronchovascular markings. No focal pulmonary opacity. No pulmonary edema. No pleural effusion. No pneumothorax. HEART AND MEDIASTINUM: No acute abnormality of the cardiac and mediastinal silhouettes. BONES AND SOFT TISSUES: Stable left IJ powerport to the proximal SVC. Surgical clips right axilla. No acute osseous abnormality. IMPRESSION: 1. No acute findings. 2. Low lung volumes with some crowding of bibasilar bronchovascular markings. Electronically signed by: Dayne Hassell MD 04/03/2024 04:32 PM EDT RP Workstation: [id number]    EKG: Independently reviewed.  Sinus rhythm  Assessment/Plan Principal problem Acute metabolic encephalopathy, due to underlying UTI -urine cultures unfortunately not sent and she is already received antibiotics.  Will try to add on - Has been started on ceftriaxone , continue.  Prior microbiology reviewed, I do not see highly resistant organisms  Active problems Essential hypertension-med rec not completely done, will probably take a while given her confusion, but she is hypertensive and I will resume amlodipine  and furosemide   Depression-med rec pending  Hypokalemia-replenish potassium  Chronic diastolic CHF, pulmonary hypertension-continue home Lasix   History of  urinary retention-on Myrbetriq , med rec pending  History of breast cancer-outpatient follow-up  History of seizure disorder- On Depakote , valproic  acid level undetectably low.  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  She would benefit from weight loss   DVT prophylaxis: Lovenox   Code Status: Full code  Family Communication: no family at bedside  Disposition Plan: home when ready  Bed Type: Medsurg Consults called: none  Obs/Inp: Obs   Nilda Fendt, MD, PhD [facility]  Contact via [email or web address]  04/03/2024, 7:32 PM

## 2024-04-03 NOTE — ED Provider Notes (Signed)
  EMERGENCY DEPARTMENT AT [facility] Provider Note   CSN: [id number] Arrival date & time: 04/03/24  1539     Patient presents with: Altered Mental Status   Monica Mccoy is a 58 y.o. female.   33-year-old female presenting emergency department for increased confusion x 2 weeks with urinary frequency and odor for the past couple days.  Has a history of metastatic breast cancer to brain.  Patient is alert to person only, not place and time.  Does not appear to be overtly reliable historian.  However, denies overt pain or discomfort at this time.   Altered Mental Status      Prior to Admission medications   Medication Sig Start Date End Date Taking? Authorizing Provider  amLODipine  (NORVASC ) 5 MG tablet Take 1 tablet (5 mg total) by mouth daily. 08/19/23 11/17/23  Pokhrel, Laxman, MD  diclofenac  Sodium (VOLTAREN ) 1 % GEL Apply 4 g topically 4 (four) times daily. 08/19/23   Pokhrel, Laxman, MD  divalproex  (DEPAKOTE ) 250 MG DR tablet Take 1 tablet (250 mg total) by mouth 2 (two) times daily. 05/21/22   Thedora Garnette HERO, MD  furosemide  (LASIX ) 20 MG tablet Take 20 mg by mouth in the morning.    [provider]  gabapentin  (NEURONTIN ) 300 MG capsule Take 2 capsules (600 mg total) by mouth 2 (two) times daily. 08/19/23 11/17/23  Pokhrel, Laxman, MD  lisinopril  (ZESTRIL ) 10 MG tablet Take 1 tablet (10 mg total) by mouth daily. Patient not taking: Reported on 08/17/2023 05/21/22   Thedora Garnette HERO, MD  MELATONIN GUMMIES PO Take 10 mg by mouth at bedtime as needed (for sleep). Patient not taking: Reported on 08/17/2023    [provider]  mirabegron  ER (MYRBETRIQ ) 50 MG TB24 tablet Take 1 tablet (50 mg total) by mouth daily. Patient not taking: Reported on 08/17/2023 05/21/22   Thedora Garnette HERO, MD  pantoprazole  (PROTONIX ) 40 MG tablet Take 1 tablet (40 mg total) by mouth daily. Patient taking differently: Take 40 mg by mouth daily before breakfast. 05/21/22    Thedora Garnette HERO, MD  thiamine  100 MG tablet Take 1 tablet (100 mg total) by mouth daily. Patient not taking: Reported on 08/17/2023 02/02/22   Samtani, Jai-Gurmukh, MD  TYLENOL  8 HOUR ARTHRITIS PAIN 650 MG CR tablet Take 650 mg by mouth every 8 (eight) hours as needed for pain. Patient not taking: Reported on 08/17/2023    [provider]  venlafaxine  XR (EFFEXOR  XR) 75 MG 24 hr capsule Take 1 capsule (75 mg total) by mouth daily with breakfast. Patient not taking: Reported on 08/17/2023 05/21/22   Thedora Garnette HERO, MD    Allergies: Dilaudid [hydromorphone hcl] and Morphine and codeine    Review of Systems  Updated Vital Signs BP (!) 157/104 (BP Location: Right Arm)   Pulse 71   Temp 98.4 F (36.9 C)   Resp 16   Ht 5' 6 (1.676 m)   Wt (!) 157.6 kg   SpO2 100%   BMI 56.08 kg/m   Physical Exam Vitals and nursing note reviewed.  Constitutional:      General: She is not in acute distress.    Appearance: She is obese. She is not toxic-appearing.  HENT:     Head: Normocephalic.     Mouth/Throat:     Mouth: Mucous membranes are moist.  Eyes:     Conjunctiva/sclera: Conjunctivae normal.  Cardiovascular:     Rate and Rhythm: Normal rate and regular rhythm.  Pulmonary:  Effort: Pulmonary effort is normal.     Breath sounds: Normal breath sounds.  Abdominal:     General: Abdomen is flat. There is no distension.     Palpations: Abdomen is soft.     Tenderness: There is no abdominal tenderness. There is no guarding or rebound.  Musculoskeletal:     Right lower leg: Edema present.     Left lower leg: Edema present.  Skin:    General: Skin is warm.     Capillary Refill: Capillary refill takes less than 2 seconds.  Neurological:     Mental Status: She is alert. She is disoriented.     Cranial Nerves: No cranial nerve deficit.     Sensory: No sensory deficit.     Motor: No weakness.  Psychiatric:        Mood and Affect: Mood normal.        Behavior: Behavior normal.      (all labs ordered are listed, but only abnormal results are displayed) Labs Reviewed  COMPREHENSIVE METABOLIC PANEL WITH GFR - Abnormal; Notable for the following components:      Result Value   Potassium 3.4 (*)    Glucose, Bld 105 (*)    All other components within normal limits  CBC - Abnormal; Notable for the following components:   WBC 3.6 (*)    All other components within normal limits  URINALYSIS, ROUTINE W REFLEX MICROSCOPIC - Abnormal; Notable for the following components:   Color, Urine AMBER (*)    APPearance HAZY (*)    Hgb urine dipstick SMALL (*)    Protein, ur 30 (*)    Nitrite POSITIVE (*)    Leukocytes,Ua LARGE (*)    Bacteria, UA MANY (*)    Non Squamous Epithelial 0-5 (*)    All other components within normal limits  VALPROIC  ACID LEVEL - Abnormal; Notable for the following components:   Valproic  Acid Lvl <10 (*)    All other components within normal limits  URINE CULTURE  AMMONIA  CBG MONITORING, ED  CBG MONITORING, ED    EKG: EKG Interpretation Date/Time:  Friday April 03 2024 15:53:37 EDT Ventricular Rate:  79 PR Interval:  197 QRS Duration:  93 QT Interval:  401 QTC Calculation: 460 R Axis:   -30  Text Interpretation: Sinus rhythm Abnormal R-wave progression, early transition Probable left ventricular hypertrophy Borderline T abnormalities, inferior leads Confirmed by Neysa Clap [id number]) on 04/03/2024 6:42:35 PM  Radiology: CT Head Wo Contrast Result Date: 04/03/2024 CLINICAL DATA:  Mental status change, unknown cause Metastatic disease evaluation AMS x 2 weeks, increased urination with odor since last night, some shob, history of brain cancer. 152/90-80-98% RA-CBG 112  EXAM: CT HEAD WITHOUT CONTRAST TECHNIQUE: Contiguous axial images were obtained from the base of the skull through the vertex without intravenous contrast. RADIATION DOSE REDUCTION: This exam was performed according to the departmental dose-optimization program which includes  automated exposure control, adjustment of the mA and/or kV according to patient size and/or use of iterative reconstruction technique. COMPARISON:  CT head 01/23/2024 FINDINGS: Brain: Cerebral ventricle sizes are concordant with the degree of cerebral volume loss. Patchy and confluent areas of decreased attenuation are noted throughout the deep and periventricular white matter of the cerebral hemispheres bilaterally, compatible with chronic microvascular ischemic disease. Similar-appearing left frontal encephalomalacia and resection. No evidence of large-territorial acute infarction. No parenchymal hemorrhage. No mass lesion. No extra-axial collection. No mass effect or midline shift. No hydrocephalus. Basilar cisterns are patent. Vascular:  No hyperdense vessel. Skull: No acute fracture or focal lesion. Prior left frontal craniotomy. Sinuses/Orbits: Paranasal sinuses and mastoid air cells are clear. Left lens replacement. Otherwise the orbits are unremarkable. Other: None. IMPRESSION: No acute intracranial abnormality.Similar-appearing left frontal encephalomalacia and resection. Please note CT is limited for evaluation of recurrent or metastatic masses, consider MRI with and without contrast for further evaluation. Electronically Signed   By: Morgane  Naveau M.D.   On: 04/03/2024 17:22   DG Chest Portable 1 View Result Date: 04/03/2024 EXAM: 1 VIEW XRAY OF THE CHEST 04/03/2024 04:29:00 PM COMPARISON: 01/23/2023 CLINICAL HISTORY: AMS. AMS x 2 weeks, increased urination with odor since last night, some shob, history of brain cancer. FINDINGS: LUNGS AND PLEURA: Low lung volumes with some crowding of bibasilar bronchovascular markings. No focal pulmonary opacity. No pulmonary edema. No pleural effusion. No pneumothorax. HEART AND MEDIASTINUM: No acute abnormality of the cardiac and mediastinal silhouettes. BONES AND SOFT TISSUES: Stable left IJ powerport to the proximal SVC. Surgical clips right axilla. No acute  osseous abnormality. IMPRESSION: 1. No acute findings. 2. Low lung volumes with some crowding of bibasilar bronchovascular markings. Electronically signed by: Dayne Hassell MD 04/03/2024 04:32 PM EDT RP Workstation: [id number]     Procedures   Medications Ordered in the ED  potassium chloride  SA (KLOR-CON  M) CR tablet 40 mEq (40 mEq Oral Patient Refused/Not Given 04/03/24 1938)  cefTRIAXone  (ROCEPHIN ) 2 g in sodium chloride  0.9 % 100 mL IVPB (has no administration in time range)  cefTRIAXone  (ROCEPHIN ) 1 g in sodium chloride  0.9 % 100 mL IVPB (0 g Intravenous Stopped 04/03/24 1825)  amLODipine  (NORVASC ) tablet 5 mg (5 mg Oral Given 04/03/24 1951)    Clinical Course as of 04/03/24 2310  Fri Apr 03, 2024  1743 CT Head Wo Contrast IMPRESSION: No acute intracranial abnormality.Similar-appearing left frontal encephalomalacia and resection. Please note CT is limited for evaluation of recurrent or metastatic masses, consider MRI with and without contrast for further evaluation.   Electronically Signed   By: Morgane  Naveau M.D.   On: 04/03/2024 17:22   [TY]  1743 Urinalysis, Routine w reflex microscopic -Urine, Clean Catch(!) C/w infection  [TY]    Clinical Course User Index [TY] Neysa Caron PARAS, DO                                 Medical Decision Making Is a 58 year old female presenting emergency department for increased confusion and urinary frequency.  She is afebrile nontachycardic, slightly hypertensive.  Maintaining oxygen saturation on room air.  Per chart review she does have breast cancer mets to brain s/p resection, seizures on Depakote , obesity.  Per chart review notes.  She is presenting similarly in the past.  Broad lab workup underway, but CT head as well.  Update.  CT head negative.  Labs without overt cause of patient's encephalopathy.  Mild low potassium.  No transaminitis.  No leukocytosis to suggest systemic infection.  Ammonia normal.  Appears to have urinary tract  infection.  Unable to contact caregiver for collaborating information.  Therefore we will admit patient for complicated UTI with delirium  Amount and/or Complexity of Data Reviewed Independent Historian: EMS    Details: Confused and altered compared to baseline per EMS reports External Data Reviewed:     Details: Does appear that she has presented in the past with similar disorientation Labs: ordered. Decision-making details documented in ED Course. Radiology: ordered and independent  interpretation performed. Decision-making details documented in ED Course.    Details: Does not appear to have obvious acute intracranial pathology ECG/medicine tests: independent interpretation performed.    Details: No STEMI  Risk Prescription drug management. Decision regarding hospitalization. Diagnosis or treatment significantly limited by social determinants of health. Risk Details: Lives at home and family member helps care for       Final diagnoses:  Complicated UTI (urinary tract infection)    ED Discharge Orders     None          Neysa Caron PARAS, DO 04/03/24 2310

## 2024-04-03 NOTE — ED Triage Notes (Signed)
 BIB EMS, AMS x 2 weeks, increased urination with odor since last night, some shob, history of brain cancer. 152/90-80-98% RA-CBG 112

## 2024-04-03 NOTE — Plan of Care (Signed)
  Problem: Clinical Measurements: Goal: Diagnostic test results will improve Outcome: Progressing   Problem: Clinical Measurements: Goal: Respiratory complications will improve Outcome: Progressing   

## 2024-04-04 DIAGNOSIS — G9341 Metabolic encephalopathy: Secondary | ICD-10-CM | POA: Diagnosis present

## 2024-04-04 DIAGNOSIS — G40909 Epilepsy, unspecified, not intractable, without status epilepticus: Secondary | ICD-10-CM | POA: Diagnosis present

## 2024-04-04 DIAGNOSIS — Z833 Family history of diabetes mellitus: Secondary | ICD-10-CM | POA: Diagnosis not present

## 2024-04-04 DIAGNOSIS — Z853 Personal history of malignant neoplasm of breast: Secondary | ICD-10-CM | POA: Diagnosis not present

## 2024-04-04 DIAGNOSIS — B9620 Unspecified Escherichia coli [E. coli] as the cause of diseases classified elsewhere: Secondary | ICD-10-CM | POA: Diagnosis present

## 2024-04-04 DIAGNOSIS — C7931 Secondary malignant neoplasm of brain: Secondary | ICD-10-CM | POA: Diagnosis present

## 2024-04-04 DIAGNOSIS — Z8249 Family history of ischemic heart disease and other diseases of the circulatory system: Secondary | ICD-10-CM | POA: Diagnosis not present

## 2024-04-04 DIAGNOSIS — Z79899 Other long term (current) drug therapy: Secondary | ICD-10-CM | POA: Diagnosis not present

## 2024-04-04 DIAGNOSIS — Z5982 Transportation insecurity: Secondary | ICD-10-CM | POA: Diagnosis not present

## 2024-04-04 DIAGNOSIS — Z7189 Other specified counseling: Secondary | ICD-10-CM | POA: Diagnosis not present

## 2024-04-04 DIAGNOSIS — E66813 Obesity, class 3: Secondary | ICD-10-CM | POA: Diagnosis present

## 2024-04-04 DIAGNOSIS — Z83438 Family history of other disorder of lipoprotein metabolism and other lipidemia: Secondary | ICD-10-CM | POA: Diagnosis not present

## 2024-04-04 DIAGNOSIS — N390 Urinary tract infection, site not specified: Secondary | ICD-10-CM | POA: Diagnosis present

## 2024-04-04 DIAGNOSIS — C50919 Malignant neoplasm of unspecified site of unspecified female breast: Secondary | ICD-10-CM | POA: Diagnosis not present

## 2024-04-04 DIAGNOSIS — F331 Major depressive disorder, recurrent, moderate: Secondary | ICD-10-CM | POA: Diagnosis present

## 2024-04-04 DIAGNOSIS — I272 Pulmonary hypertension, unspecified: Secondary | ICD-10-CM | POA: Diagnosis present

## 2024-04-04 DIAGNOSIS — Z6841 Body Mass Index (BMI) 40.0 and over, adult: Secondary | ICD-10-CM | POA: Diagnosis not present

## 2024-04-04 DIAGNOSIS — R52 Pain, unspecified: Secondary | ICD-10-CM | POA: Diagnosis not present

## 2024-04-04 DIAGNOSIS — I5032 Chronic diastolic (congestive) heart failure: Secondary | ICD-10-CM | POA: Diagnosis present

## 2024-04-04 DIAGNOSIS — Z751 Person awaiting admission to adequate facility elsewhere: Secondary | ICD-10-CM | POA: Diagnosis not present

## 2024-04-04 DIAGNOSIS — R5381 Other malaise: Secondary | ICD-10-CM | POA: Diagnosis present

## 2024-04-04 DIAGNOSIS — Z515 Encounter for palliative care: Secondary | ICD-10-CM | POA: Diagnosis not present

## 2024-04-04 DIAGNOSIS — E876 Hypokalemia: Secondary | ICD-10-CM | POA: Diagnosis present

## 2024-04-04 DIAGNOSIS — Z5941 Food insecurity: Secondary | ICD-10-CM | POA: Diagnosis not present

## 2024-04-04 DIAGNOSIS — Z9013 Acquired absence of bilateral breasts and nipples: Secondary | ICD-10-CM | POA: Diagnosis not present

## 2024-04-04 DIAGNOSIS — I110 Hypertensive heart disease with heart failure: Secondary | ICD-10-CM | POA: Diagnosis present

## 2024-04-04 DIAGNOSIS — K219 Gastro-esophageal reflux disease without esophagitis: Secondary | ICD-10-CM | POA: Diagnosis present

## 2024-04-04 DIAGNOSIS — Z7401 Bed confinement status: Secondary | ICD-10-CM | POA: Diagnosis not present

## 2024-04-04 LAB — COMPREHENSIVE METABOLIC PANEL WITH GFR
ALT: 10 U/L (ref 0–44)
AST: 14 U/L — ABNORMAL LOW (ref 15–41)
Albumin: 3.3 g/dL — ABNORMAL LOW (ref 3.5–5.0)
Alkaline Phosphatase: 77 U/L (ref 38–126)
Anion gap: 9 (ref 5–15)
BUN: 10 mg/dL (ref 6–20)
CO2: 26 mmol/L (ref 22–32)
Calcium: 8.9 mg/dL (ref 8.9–10.3)
Chloride: 105 mmol/L (ref 98–111)
Creatinine, Ser: 0.63 mg/dL (ref 0.44–1.00)
GFR, Estimated: >60 mL/min (ref >60)
Glucose, Bld: 113 mg/dL — ABNORMAL HIGH (ref 70–99)
Potassium: 3.3 mmol/L — ABNORMAL LOW (ref 3.5–5.1)
Sodium: 140 mmol/L (ref 135–145)
Total Bilirubin: 1.2 mg/dL (ref 0.0–1.2)
Total Protein: 7 g/dL (ref 6.5–8.1)

## 2024-04-04 LAB — CBC
HCT: 39.5 % (ref 36.0–46.0)
Hemoglobin: 12.1 g/dL (ref 12.0–15.0)
MCH: 27.8 pg (ref 26.0–34.0)
MCHC: 30.6 g/dL (ref 30.0–36.0)
MCV: 90.6 fL (ref 80.0–100.0)
Platelets: 199 K/uL (ref 150–400)
RBC: 4.36 MIL/uL (ref 3.87–5.11)
RDW: 13.7 % (ref 11.5–15.5)
WBC: 4 K/uL (ref 4.0–10.5)
nRBC: 0 % (ref 0.0–0.2)

## 2024-04-04 LAB — VALPROIC ACID LEVEL: Valproic Acid Lvl: <10 ug/mL — ABNORMAL LOW (ref 50–100)

## 2024-04-04 MED ORDER — GABAPENTIN 300 MG PO CAPS
600.0000 mg | ORAL_CAPSULE | Freq: Two times a day (BID) | ORAL | Status: DC
  Administered 2024-04-04 – 2024-04-21 (×40): 600 mg via ORAL
  Filled 2024-04-04 (×34): qty 2

## 2024-04-04 MED ORDER — POTASSIUM CHLORIDE CRYS ER 20 MEQ PO TBCR
40.0000 meq | EXTENDED_RELEASE_TABLET | Freq: Once | ORAL | Status: AC
Start: 2024-04-04 — End: 2024-04-04
  Administered 2024-04-04: 40 meq via ORAL
  Filled 2024-04-04: qty 2

## 2024-04-04 MED ORDER — DIVALPROEX SODIUM 250 MG PO DR TAB
250.0000 mg | DELAYED_RELEASE_TABLET | Freq: Two times a day (BID) | ORAL | Status: DC
  Administered 2024-04-04 – 2024-04-21 (×40): 250 mg via ORAL
  Filled 2024-04-04 (×34): qty 1

## 2024-04-04 MED ORDER — ACETAMINOPHEN 650 MG RE SUPP: 650.0000 mg | Freq: Four times a day (QID) | RECTAL | Status: DC | PRN

## 2024-04-04 MED ORDER — ONDANSETRON HCL 4 MG/2ML IJ SOLN: 4.0000 mg | Freq: Four times a day (QID) | INTRAMUSCULAR | Status: DC | PRN

## 2024-04-04 MED ORDER — AMLODIPINE BESYLATE 5 MG PO TABS
5.0000 mg | ORAL_TABLET | Freq: Every day | ORAL | Status: DC
Start: 2024-04-04 — End: 2024-04-21
  Administered 2024-04-04 – 2024-04-21 (×20): 5 mg via ORAL
  Filled 2024-04-04 (×18): qty 1

## 2024-04-04 MED ORDER — ONDANSETRON HCL 4 MG PO TABS
4.0000 mg | ORAL_TABLET | Freq: Four times a day (QID) | ORAL | Status: DC | PRN
Start: 2024-04-04 — End: 2024-04-21

## 2024-04-04 MED ORDER — VENLAFAXINE HCL ER 75 MG PO CP24
75.0000 mg | ORAL_CAPSULE | Freq: Every day | ORAL | Status: DC
  Administered 2024-04-05 – 2024-04-21 (×20): 75 mg via ORAL
  Filled 2024-04-04 (×17): qty 1

## 2024-04-04 MED ORDER — ENOXAPARIN SODIUM 80 MG/0.8ML IJ SOSY
80.0000 mg | PREFILLED_SYRINGE | INTRAMUSCULAR | Status: DC
  Administered 2024-04-04 – 2024-04-21 (×21): 80 mg via SUBCUTANEOUS
  Filled 2024-04-04 (×19): qty 0.8

## 2024-04-04 MED ORDER — ACETAMINOPHEN 325 MG PO TABS
650.0000 mg | ORAL_TABLET | Freq: Four times a day (QID) | ORAL | Status: DC | PRN
  Administered 2024-04-13: 650 mg via ORAL
  Filled 2024-04-04 (×2): qty 2

## 2024-04-04 MED ORDER — FUROSEMIDE 20 MG PO TABS
20.0000 mg | ORAL_TABLET | Freq: Every day | ORAL | Status: DC
  Administered 2024-04-04: 20 mg via ORAL
  Filled 2024-04-04: qty 1

## 2024-04-04 NOTE — TOC Initial Note (Signed)
 Transition of Care [facility]) - Initial/Assessment Note    Patient Details  Name: Monica Mccoy MRN: [id number] Date of Birth: [birth year]  Transition of Care [facility]) CM/SW Contact:    Doneta Glenys DASEN, RN Phone Number: 04/04/2024, 2:37 PM  Clinical Narrative:                 MOON completed via telephonic permission [name] Ill [phone]. [name] states patients daughter is making things difficult. Patient still has out of state insurance and no PCP and is looking to get patient placed in a facility that can property provide care. [name] states to please continue to reach out to her with any updates. IP case management will progression to discharge.  Expected Discharge Plan: Home/Self Care Barriers to Discharge: Continued Medical Work up   Patient Goals and CMS Choice   CMS Medicare.gov Compare Post Acute Care list provided to::  (NA) Choice offered to / list presented to : NA [place] ownership interest in [facility].provided to:: Parent NA    Expected Discharge Plan and Services In-house Referral: NA Discharge Planning Services: CM Consult Post Acute Care Choice: NA Living arrangements for the past 2 months: Apartment                 DME Arranged: N/A DME Agency: NA       HH Arranged: NA HH Agency: NA        Prior Living Arrangements/Services Living arrangements for the past 2 months: Apartment Lives with:: Relatives Patient language and need for interpreter reviewed:: Yes        Need for Family Participation in Patient Care: Yes (Comment) Care giver support system in place?: Yes (comment) Current home services:  (NA) Criminal Activity/Legal Involvement Pertinent to Current Situation/Hospitalization: No - Comment as needed  Activities of Daily Living   ADL Screening (condition at time of admission) Independently performs ADLs?: Yes (appropriate for developmental age) Is the patient deaf or have difficulty hearing?: No Does the patient have difficulty  seeing, even when wearing glasses/contacts?: Yes Does the patient have difficulty concentrating, remembering, or making decisions?: Yes  Permission Sought/Granted                  Emotional Assessment Appearance:: Appears stated age Attitude/Demeanor/Rapport: Avoidant Affect (typically observed): Defensive   Alcohol / Substance Use: Not Applicable Psych Involvement: No (comment)  Admission diagnosis:  Complicated UTI (urinary tract infection) [N39.0] Patient Active Problem List   Diagnosis Date Noted   Complicated UTI (urinary tract infection) 08/17/2023   Obesity, Class III, BMI 40-49.9 (morbid obesity) 06/16/2022   Palliative care encounter 06/09/2022   Goals of care, counseling/discussion 06/09/2022   Counseling and coordination of care 06/09/2022   Altered mental status 06/09/2022   Acute metabolic encephalopathy 06/08/2022   COVID-19 virus infection 06/08/2022   Generalized weakness 06/08/2022   Tendinitis of both ankles 04/25/2022   Pedal edema 04/13/2022   Cholelithiasis 10/16/2021   Uterine fibroid 10/16/2021   Aortic atherosclerosis (HCC) 10/16/2021   Chemotherapy-induced neuropathy (HCC) 09/11/2021   Localization-related (focal) (partial) symptomatic epilepsy and epileptic syndromes with complex partial seizures, not intractable, without status epilepticus (HCC) 09/11/2021   Essential hypertension 05/30/2021   Port-A-Cath in place 01/16/2021   Migraine without aura 12/19/2020   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) 09/22/2020   Gastroesophageal reflux disease without esophagitis 06/29/2020   Other chronic pain 06/29/2020   Moderate episode of recurrent major depressive disorder (HCC) 06/29/2020   Urinary incontinence 06/29/2020  PCP:  Pcp, No Pharmacy:   [id number] [address], [place] - [address] [place] [address] Phone: [phone] Fax: [phone]     Social Drivers of Health (SDOH) Social History: SDOH  Screenings   Food Insecurity: Food Insecurity Present (04/03/2024)  Housing: Unknown (04/03/2024)  Transportation Needs: Unmet Transportation Needs (04/03/2024)  Utilities: At Risk (04/03/2024)  Alcohol Screen: Low Risk  (03/27/2022)  Depression (PHQ2-9): High Risk (05/21/2022)  Financial Resource Strain: Low Risk  (03/27/2022)  Physical Activity: Insufficiently Active (03/27/2022)  Social Connections: Feeling Socially Integrated (01/11/2023)   Received from [facility]  Stress: No Stress Concern Present (03/27/2022)  Tobacco Use: Low Risk  (04/03/2024)   SDOH Interventions:     Readmission Risk Interventions    08/19/2023   11:05 AM 06/11/2022   11:48 AM 06/10/2022   10:14 AM  Readmission Risk Prevention Plan  Post Dischage Appt Complete    Medication Screening Complete    Transportation Screening Complete Complete Complete  PCP or Specialist Appt within 5-7 Days  Complete Complete  Home Care Screening  Complete Complete  Medication Review (RN CM)  Complete Complete

## 2024-04-04 NOTE — Care Management Obs Status (Signed)
 MEDICARE OBSERVATION STATUS NOTIFICATION   Patient Details  Name: Monica Mccoy MRN: [id number] Date of Birth: 1965/12/18   Medicare Observation Status Notification Given:  Yes    Doneta Glenys DASEN, RN 04/04/2024, 2:33 PM

## 2024-04-04 NOTE — Progress Notes (Signed)
 PROGRESS NOTE    Monica Mccoy  FMW:[id number] DOB: 02/05/1966 DOA: 04/03/2024 PCP: Pcp, No   Brief Narrative: 58 y.o. female with medical history significant of breast cancer with brain mets, apparently in remission, cognitive impairment, morbid obesity, hypertension, chronic diastolic CHF comes to the hospital with increased confusion over the last 1 to 2 weeks.  Apparently she has been having increased urination episodes, urine has been foul-smelling for few days.  I tried calling the family her brother at the number given twice in the computer apparently as a lawyer's office so I was not able to reach her brother.  Her urine still smells foul smelling and she remains confused.  I am not sure of her baseline. She has had multiple hospital admissions for the same.  Workup in the ED includes negative head CT for acute findings, no leukocytosis normal ammonia level.  ED physician was unable to contact the caregiver.  She was admitted with a diagnosis of complicated UTI with delirium.  Past urine cultures with E. coli and Streptococcus Galllyticus resistant to Cipro  and levofloxacin.   Assessment & Plan:   Principal Problem:   Complicated UTI (urinary tract infection)    #1 acute mental metabolic encephalopathy most likely related to urinary tract infection.  I am not sure of her baseline however she was admitted with fever and confusion and foul-smelling urine and frequency.  She remains confused and delirious.  Again urine cultures were not sent they obtained the UA and started her on Rocephin .tsh thiamine  ammonia ok  2.  Essential hypertension-blood pressure 1 35/97 continue amlodipine  and Lasix .     3 Depression-on effexor    4 Hypokalemia-replenish potassium k3.3   5 Chronic diastolic CHF, pulmonary hypertension-continue home Lasix     6 History of urinary retention-on Myrbetriq  7 History of breast cancer-outpatient follow-up   8 History of seizure disorder- On Depakote , valproic  acid  level added    9 Chemotherapy-induced neuropathy-on gabapentin   10 Obesity, morbid-BMI 56.  She would benefit from weight loss     Estimated body mass index is 56.08 kg/m as calculated from the following:   Height as of this encounter: 5' 6 (1.676 m).   Weight as of this encounter: 157.6 kg.  DVT prophylaxis: lovenox  Code Status:full Family Communication:can't reach brother Disposition Plan:  Status is: Observation The patient remains OBS appropriate and will d/c before 2 midnights.   Consultants: none  Procedures: none Antimicrobials: rocephin   Subjective: Fouls smell as in walked into room She is confused not following commands or answering questions  Objective: Vitals:   04/03/24 2029 04/04/24 0052 04/04/24 0502 04/04/24 0906  BP: (!) 157/104 (!) 142/97 (!) 129/98 (!) 135/97  Pulse: 71 89 87 90  Resp:    20  Temp: 98.4 F (36.9 C) 98.4 F (36.9 C) 98.5 F (36.9 C) 98.3 F (36.8 C)  TempSrc:    Oral  SpO2: 100% 97% 99% 94%  Weight:      Height:        Intake/Output Summary (Last 24 hours) at 04/04/2024 1421 Last data filed at 04/04/2024 1030 Gross per 24 hour  Intake 240 ml  Output --  Net 240 ml   Filed Weights   04/03/24 1552  Weight: (!) 157.6 kg    Examination:  General exam: Appears calm and comfortable  Respiratory system: Clear to auscultation. Respiratory effort normal. Cardiovascular system: Regular not tachycardic  gastrointestinal system: Abdomen is nondistended, soft tender in the lower abdomen  Central nervous system: awake confused  Extremities: trace edema  Data Reviewed: I have personally reviewed following labs and imaging studies  CBC: Recent Labs  Lab 04/03/24 1705 04/04/24 0518  WBC 3.6* 4.0  HGB 13.1 12.1  HCT 42.6 39.5  MCV 91.0 90.6  PLT 194 199   Basic Metabolic Panel: Recent Labs  Lab 04/03/24 1705 04/04/24 0518  NA 140 140  K 3.4* 3.3*  CL 105 105  CO2 27 26  GLUCOSE 105* 113*  BUN 10 10  CREATININE 0.69  0.63  CALCIUM  9.1 8.9   GFR: Estimated Creatinine Clearance: 120.8 mL/min (by C-G formula based on SCr of 0.63 mg/dL). Liver Function Tests: Recent Labs  Lab 04/03/24 1705 04/04/24 0518  AST 15 14*  ALT 12 10  ALKPHOS 82 77  BILITOT 1.0 1.2  PROT 7.5 7.0  ALBUMIN 3.6 3.3*   No results for input(s): LIPASE, AMYLASE in the last 168 hours. Recent Labs  Lab 04/03/24 1704  AMMONIA 25   Coagulation Profile: No results for input(s): INR, PROTIME in the last 168 hours. Cardiac Enzymes: No results for input(s): CKTOTAL, CKMB, CKMBINDEX, TROPONINI in the last 168 hours. BNP (last 3 results) No results for input(s): PROBNP in the last 8760 hours. HbA1C: No results for input(s): HGBA1C in the last 72 hours. CBG: Recent Labs  Lab 04/03/24 1630  GLUCAP 95   Lipid Profile: No results for input(s): CHOL, HDL, LDLCALC, TRIG, CHOLHDL, LDLDIRECT in the last 72 hours. Thyroid  Function Tests: No results for input(s): TSH, T4TOTAL, FREET4, T3FREE, THYROIDAB in the last 72 hours. Anemia Panel: No results for input(s): VITAMINB12, FOLATE, FERRITIN, TIBC, IRON, RETICCTPCT in the last 72 hours. Sepsis Labs: No results for input(s): PROCALCITON, LATICACIDVEN in the last 168 hours.  No results found for this or any previous visit (from the past 240 hours).       Radiology Studies: CT Head Wo Contrast Result Date: 04/03/2024 CLINICAL DATA:  Mental status change, unknown cause Metastatic disease evaluation AMS x 2 weeks, increased urination with odor since last night, some shob, history of brain cancer. 152/90-80-98% RA-CBG 112  EXAM: CT HEAD WITHOUT CONTRAST TECHNIQUE: Contiguous axial images were obtained from the base of the skull through the vertex without intravenous contrast. RADIATION DOSE REDUCTION: This exam was performed according to the departmental dose-optimization program which includes automated exposure control,  adjustment of the mA and/or kV according to patient size and/or use of iterative reconstruction technique. COMPARISON:  CT head 01/23/2024 FINDINGS: Brain: Cerebral ventricle sizes are concordant with the degree of cerebral volume loss. Patchy and confluent areas of decreased attenuation are noted throughout the deep and periventricular white matter of the cerebral hemispheres bilaterally, compatible with chronic microvascular ischemic disease. Similar-appearing left frontal encephalomalacia and resection. No evidence of large-territorial acute infarction. No parenchymal hemorrhage. No mass lesion. No extra-axial collection. No mass effect or midline shift. No hydrocephalus. Basilar cisterns are patent. Vascular: No hyperdense vessel. Skull: No acute fracture or focal lesion. Prior left frontal craniotomy. Sinuses/Orbits: Paranasal sinuses and mastoid air cells are clear. Left lens replacement. Otherwise the orbits are unremarkable. Other: None. IMPRESSION: No acute intracranial abnormality.Similar-appearing left frontal encephalomalacia and resection. Please note CT is limited for evaluation of recurrent or metastatic masses, consider MRI with and without contrast for further evaluation. Electronically Signed   By: Morgane  Naveau M.D.   On: 04/03/2024 17:22   DG Chest Portable 1 View Result Date: 04/03/2024 EXAM: 1 VIEW XRAY OF THE CHEST 04/03/2024 04:29:00 PM COMPARISON: 01/23/2023 CLINICAL HISTORY: AMS. AMS  x 2 weeks, increased urination with odor since last night, some shob, history of brain cancer. FINDINGS: LUNGS AND PLEURA: Low lung volumes with some crowding of bibasilar bronchovascular markings. No focal pulmonary opacity. No pulmonary edema. No pleural effusion. No pneumothorax. HEART AND MEDIASTINUM: No acute abnormality of the cardiac and mediastinal silhouettes. BONES AND SOFT TISSUES: Stable left IJ powerport to the proximal SVC. Surgical clips right axilla. No acute osseous abnormality. IMPRESSION:  1. No acute findings. 2. Low lung volumes with some crowding of bibasilar bronchovascular markings. Electronically signed by: Dayne Hassell MD 04/03/2024 04:32 PM EDT RP Workstation: [id number]        Scheduled Meds:  amLODipine   5 mg Oral Daily   enoxaparin  (LOVENOX ) injection  80 mg Subcutaneous Q24H   furosemide   20 mg Oral Daily   potassium chloride   40 mEq Oral Once   Continuous Infusions:  cefTRIAXone  (ROCEPHIN )  IV 2 g (04/04/24 1034)     LOS: 0 days   Almarie KANDICE Hoots, MD  04/04/2024, 2:21 PM

## 2024-04-05 DIAGNOSIS — N390 Urinary tract infection, site not specified: Secondary | ICD-10-CM | POA: Diagnosis not present

## 2024-04-05 LAB — CBC
HCT: 38.9 % (ref 36.0–46.0)
Hemoglobin: 12.2 g/dL (ref 12.0–15.0)
MCH: 28.2 pg (ref 26.0–34.0)
MCHC: 31.4 g/dL (ref 30.0–36.0)
MCV: 90 fL (ref 80.0–100.0)
Platelets: 176 K/uL (ref 150–400)
RBC: 4.32 MIL/uL (ref 3.87–5.11)
RDW: 13.6 % (ref 11.5–15.5)
WBC: 4.2 K/uL (ref 4.0–10.5)
nRBC: 0 % (ref 0.0–0.2)

## 2024-04-05 LAB — COMPREHENSIVE METABOLIC PANEL WITH GFR
ALT: 12 U/L (ref 0–44)
AST: 17 U/L (ref 15–41)
Albumin: 3.1 g/dL — ABNORMAL LOW (ref 3.5–5.0)
Alkaline Phosphatase: 79 U/L (ref 38–126)
Anion gap: 9 (ref 5–15)
BUN: 10 mg/dL (ref 6–20)
CO2: 25 mmol/L (ref 22–32)
Calcium: 8.6 mg/dL — ABNORMAL LOW (ref 8.9–10.3)
Chloride: 100 mmol/L (ref 98–111)
Creatinine, Ser: 0.66 mg/dL (ref 0.44–1.00)
GFR, Estimated: >60 mL/min (ref >60)
Glucose, Bld: 118 mg/dL — ABNORMAL HIGH (ref 70–99)
Potassium: 3.9 mmol/L (ref 3.5–5.1)
Sodium: 134 mmol/L — ABNORMAL LOW (ref 135–145)
Total Bilirubin: 0.7 mg/dL (ref 0.0–1.2)
Total Protein: 6.9 g/dL (ref 6.5–8.1)

## 2024-04-05 NOTE — Plan of Care (Signed)
  Problem: Clinical Measurements: Goal: Respiratory complications will improve Outcome: Progressing   Problem: Clinical Measurements: Goal: Diagnostic test results will improve Outcome: Progressing   

## 2024-04-05 NOTE — Progress Notes (Signed)
 PROGRESS NOTE    Monica Mccoy  FMW:[id number] DOB: [birth year] DOA: 04/03/2024 PCP: Pcp, No   Brief Narrative: 58 y.o. female with medical history significant of breast cancer with brain mets, apparently in remission, cognitive impairment, morbid obesity, hypertension, chronic diastolic CHF comes to the hospital with increased confusion over the last 1 to 2 weeks.  Apparently she has been having increased urination episodes, urine has been foul-smelling for few days.  I tried calling the family her brother at the number given twice in the computer apparently as a lawyer's office so I was not able to reach her brother.  Her urine still smells foul smelling and she remains confused.  I am not sure of her baseline. She has had multiple hospital admissions for the same.  Workup in the ED includes negative head CT for acute findings, no leukocytosis normal ammonia level.  ED physician was unable to contact the caregiver.  She was admitted with a diagnosis of complicated UTI with delirium.  Past urine cultures with E. coli and Streptococcus Galllyticus resistant to Cipro  and levofloxacin.   Assessment & Plan:   Principal Problem:   Complicated UTI (urinary tract infection)    #1 acute mental metabolic encephalopathy most likely related to urinary tract infection.  I am not sure of her baseline however she was admitted with fever and confusion and foul-smelling urine and frequency.  She remains confused and delirious.  Again urine cultures were not sent they obtained the UA and started her on Rocephin .tsh thiamine  ammonia ok  2.  Essential hypertension-blood pressure 1 35/97 continue amlodipine  and Lasix .     3 Depression-on effexor    4 Hypokalemia-replenish potassium k3.3 labs pending   5 Chronic diastolic CHF, pulmonary hypertension-continue home Lasix     6 History of urinary retention-on Myrbetriq  7 History of breast cancer-outpatient follow-up   8 History of seizure disorder- On Depakote ,  valproic  acid level added    9 Chemotherapy-induced neuropathy-on gabapentin   10 Obesity, morbid-BMI 56.  She would benefit from weight loss     Estimated body mass index is 56.08 kg/m as calculated from the following:   Height as of this encounter: 5' 6 (1.676 m).   Weight as of this encounter: 157.6 kg.  DVT prophylaxis: lovenox  Code Status:full Family Communication:can't reach brother Disposition Plan:  Status pd:pwejupzwu NEED SNF ON DC   Consultants: none  Procedures: none Antimicrobials: rocephin   Subjective:  More awake oriented  Objective: Vitals:   04/04/24 0906 04/04/24 2007 04/05/24 0536 04/05/24 1159  BP: (!) 135/97 118/84 127/78 (!) 136/98  Pulse: 90 95 69 82  Resp: 20 18 17 20   Temp: 98.3 F (36.8 C) 98.5 F (36.9 C) 97.9 F (36.6 C) 98.3 F (36.8 C)  TempSrc: Oral  Oral Oral  SpO2: 94% 95% 97% 96%  Weight:      Height:        Intake/Output Summary (Last 24 hours) at 04/05/2024 1403 Last data filed at 04/05/2024 0700 Gross per 24 hour  Intake 1020 ml  Output 1000 ml  Net 20 ml   Filed Weights   04/03/24 1552  Weight: (!) 157.6 kg    Examination:  General exam: Appears calm and comfortable  Respiratory system: Clear to auscultation. Respiratory effort normal. Cardiovascular system: Regular not tachycardic  gastrointestinal system: Abdomen is nondistended, soft tender in the lower abdomen  Central nervous system: awake confused Extremities: trace edema  Data Reviewed: I have personally reviewed following labs and imaging studies  CBC: Recent Labs  Lab 04/03/24 1705 04/04/24 0518  WBC 3.6* 4.0  HGB 13.1 12.1  HCT 42.6 39.5  MCV 91.0 90.6  PLT 194 199   Basic Metabolic Panel: Recent Labs  Lab 04/03/24 1705 04/04/24 0518  NA 140 140  K 3.4* 3.3*  CL 105 105  CO2 27 26  GLUCOSE 105* 113*  BUN 10 10  CREATININE 0.69 0.63  CALCIUM  9.1 8.9   GFR: Estimated Creatinine Clearance: 120.8 mL/min (by C-G formula based on SCr of  0.63 mg/dL). Liver Function Tests: Recent Labs  Lab 04/03/24 1705 04/04/24 0518  AST 15 14*  ALT 12 10  ALKPHOS 82 77  BILITOT 1.0 1.2  PROT 7.5 7.0  ALBUMIN 3.6 3.3*   No results for input(s): LIPASE, AMYLASE in the last 168 hours. Recent Labs  Lab 04/03/24 1704  AMMONIA 25   Coagulation Profile: No results for input(s): INR, PROTIME in the last 168 hours. Cardiac Enzymes: No results for input(s): CKTOTAL, CKMB, CKMBINDEX, TROPONINI in the last 168 hours. BNP (last 3 results) No results for input(s): PROBNP in the last 8760 hours. HbA1C: No results for input(s): HGBA1C in the last 72 hours. CBG: Recent Labs  Lab 04/03/24 1630  GLUCAP 95   Lipid Profile: No results for input(s): CHOL, HDL, LDLCALC, TRIG, CHOLHDL, LDLDIRECT in the last 72 hours. Thyroid  Function Tests: No results for input(s): TSH, T4TOTAL, FREET4, T3FREE, THYROIDAB in the last 72 hours. Anemia Panel: No results for input(s): VITAMINB12, FOLATE, FERRITIN, TIBC, IRON, RETICCTPCT in the last 72 hours. Sepsis Labs: No results for input(s): PROCALCITON, LATICACIDVEN in the last 168 hours.  Recent Results (from the past 240 hours)  Urine Culture     Status: Abnormal (Preliminary result)   Collection Time: 04/03/24  7:38 PM   Specimen: Urine, Clean Catch  Result Value Ref Range Status   Specimen Description   Final    URINE, CLEAN CATCH Performed at [facility], [address], [place], [place] [address]    Special Requests   Final    NONE Performed at [facility], [address] [place], [place] [address]    Culture (A)  Final    >=100,000 COLONIES/mL ESCHERICHIA COLI SUSCEPTIBILITIES TO FOLLOW Performed at [facility], [address] [place], [place] [address]    Report Status PENDING  Incomplete         Radiology Studies: CT Head Wo Contrast Result Date: 04/03/2024 CLINICAL DATA:   Mental status change, unknown cause Metastatic disease evaluation AMS x 2 weeks, increased urination with odor since last night, some shob, history of brain cancer. 152/90-80-98% RA-CBG 112  EXAM: CT HEAD WITHOUT CONTRAST TECHNIQUE: Contiguous axial images were obtained from the base of the skull through the vertex without intravenous contrast. RADIATION DOSE REDUCTION: This exam was performed according to the departmental dose-optimization program which includes automated exposure control, adjustment of the mA and/or kV according to patient size and/or use of iterative reconstruction technique. COMPARISON:  CT head 01/23/2024 FINDINGS: Brain: Cerebral ventricle sizes are concordant with the degree of cerebral volume loss. Patchy and confluent areas of decreased attenuation are noted throughout the deep and periventricular white matter of the cerebral hemispheres bilaterally, compatible with chronic microvascular ischemic disease. Similar-appearing left frontal encephalomalacia and resection. No evidence of large-territorial acute infarction. No parenchymal hemorrhage. No mass lesion. No extra-axial collection. No mass effect or midline shift. No hydrocephalus. Basilar cisterns are patent. Vascular: No hyperdense vessel. Skull: No acute fracture or focal  lesion. Prior left frontal craniotomy. Sinuses/Orbits: Paranasal sinuses and mastoid air cells are clear. Left lens replacement. Otherwise the orbits are unremarkable. Other: None. IMPRESSION: No acute intracranial abnormality.Similar-appearing left frontal encephalomalacia and resection. Please note CT is limited for evaluation of recurrent or metastatic masses, consider MRI with and without contrast for further evaluation. Electronically Signed   By: Morgane  Naveau M.D.   On: 04/03/2024 17:22   DG Chest Portable 1 View Result Date: 04/03/2024 EXAM: 1 VIEW XRAY OF THE CHEST 04/03/2024 04:29:00 PM COMPARISON: 01/23/2023 CLINICAL HISTORY: AMS. AMS x 2 weeks,  increased urination with odor since last night, some shob, history of brain cancer. FINDINGS: LUNGS AND PLEURA: Low lung volumes with some crowding of bibasilar bronchovascular markings. No focal pulmonary opacity. No pulmonary edema. No pleural effusion. No pneumothorax. HEART AND MEDIASTINUM: No acute abnormality of the cardiac and mediastinal silhouettes. BONES AND SOFT TISSUES: Stable left IJ powerport to the proximal SVC. Surgical clips right axilla. No acute osseous abnormality. IMPRESSION: 1. No acute findings. 2. Low lung volumes with some crowding of bibasilar bronchovascular markings. Electronically signed by: Dayne Hassell MD 04/03/2024 04:32 PM EDT RP Workstation: [id number]        Scheduled Meds:  amLODipine   5 mg Oral Daily   divalproex   250 mg Oral BID   enoxaparin  (LOVENOX ) injection  80 mg Subcutaneous Q24H   gabapentin   600 mg Oral BID   venlafaxine  XR  75 mg Oral Q breakfast   Continuous Infusions:  cefTRIAXone  (ROCEPHIN )  IV 2 g (04/05/24 0928)     LOS: 1 day   Monica KANDICE Hoots, MD  04/05/2024, 2:03 PM

## 2024-04-05 NOTE — Evaluation (Signed)
 Physical Therapy Evaluation Patient Details Name: Monica Mccoy MRN: [id number] DOB: 06/16/1966 Today's Date: 04/05/2024  History of Present Illness  Pt is 58 yo female admitted on 04/03/24 with complicated UTI and acute metabolic encephalopathy. Pt had CT head with no acute findings.  Pt does have breast CA with brain mets with hx of brain surgery and masectomy that is apparently in remission.  Other hx including but not limited to cognitive impairment, morbid obesity, HTN, CHF, seizure, GERD, and anxiety.  Clinical Impression  Pt admitted with above diagnosis. Pt confused (hx of brain mets, surgery, cog impairment) and unable to provide baseline, but was cooperative and followed commands, needed increased cues.  At last admission, pt was ambulating with RW and lived with sister.  Today, pt with heavy reliance on bed rails but able to transfer with min A to standing.  With turning, pt difficulty advancing feet and attempting to sit too early.  Not able to tolerate ambulation without close chair follow.  Pt pleasant and cooperative but difficulty expressing self with non-sensical speech/word salad at times.  Pt currently with functional limitations due to the deficits listed below (see PT Problem List). Pt will benefit from acute skilled PT to increase their independence and safety with mobility to allow discharge.  Patient will benefit from continued inpatient follow up therapy, <3 hours/day at d/c.          If plan is discharge home, recommend the following: Two people to help with bathing/dressing/bathroom;Two people to help with walking and/or transfers   Can travel by private vehicle   No    Equipment Recommendations Wheelchair cushion (measurements PT);Wheelchair (measurements PT);Rolling walker (2 wheels);BSC/3in1;Hospital bed (bariatric equipment)  Recommendations for Other Services       Functional Status Assessment Patient has had a recent decline in their functional status and  demonstrates the ability to make significant improvements in function in a reasonable and predictable amount of time.     Precautions / Restrictions Precautions Precautions: Fall      Mobility  Bed Mobility Overal bed mobility: Needs Assistance Bed Mobility: Supine to Sit     Supine to sit: Min assist, HOB elevated, Used rails          Transfers Overall transfer level: Needs assistance Equipment used: Rolling walker (2 wheels) Transfers: Sit to/from Stand, Bed to chair/wheelchair/BSC Sit to Stand: From elevated surface, Min assist   Step pivot transfers: Min assist       General transfer comment: Able to rise on her own but needing light min A to stabilize self and RW.  Pt attempting to sit too soon (on handle) during transfer needing mod cue to stand and then chair slid closer.    Ambulation/Gait Ambulation/Gait assistance: Mod assist Gait Distance (Feet): 1 Feet Assistive device: Rolling walker (2 wheels) Gait Pattern/deviations: Step-to pattern, Decreased stride length, Shuffle       General Gait Details: Pt only able to take small steps to chair.  She had difficulty advancing R LE and shuffled L LE toward chair.  Pt fatigued easily and unable to express verbally.  If going further would need close chair follow.  Stairs            Wheelchair Mobility     Tilt Bed    Modified Rankin (Stroke Patients Only)       Balance Overall balance assessment: Needs assistance Sitting-balance support: No upper extremity supported Sitting balance-Leahy Scale: Good     Standing balance support: Bilateral upper extremity  supported Standing balance-Leahy Scale: Poor                               Pertinent Vitals/Pain Pain Assessment Pain Assessment: No/denies pain    Home Living Family/patient expects to be discharged to:: Private residence Living Arrangements: Other (Comment) (sister) Available Help at Discharge: Family                Additional Comments: Pt not able to provide history.  Nurse confirmed pt lived with sister but sister reports she can not handle her like she is now.    Prior Function Prior Level of Function : Needs assist             Mobility Comments: Not able to provide hx.  Last admission was ambulating with RW and stayed on main level of her home. ADLs Comments: Last admission was getting supervision for ADLs     Extremity/Trunk Assessment   Upper Extremity Assessment Upper Extremity Assessment: Overall WFL for tasks assessed;Difficult to assess due to impaired cognition    Lower Extremity Assessment Lower Extremity Assessment: Overall WFL for tasks assessed;Difficult to assess due to impaired cognition (difficulty with MMT commands but demonstrating at least 3/5)    Cervical / Trunk Assessment Cervical / Trunk Assessment: Normal  Communication        Cognition Arousal: Alert Behavior During Therapy: WFL for tasks assessed/performed   PT - Cognitive impairments: History of cognitive impairments, No family/caregiver present to determine baseline, Orientation, Problem solving, Safety/Judgement, Awareness   Orientation impairments: Person                   PT - Cognition Comments: Pt only oriented to self.  She often has non-sensical speech or word salad.  Pt very pleasant and cooperative - but only following 1 step commands and needs cues for sequencing/initiation Following commands: Impaired Following commands impaired: Only follows one step commands consistently     Cueing       General Comments General comments (skin integrity, edema, etc.): VSS    Exercises     Assessment/Plan    PT Assessment Patient needs continued PT services  PT Problem List Decreased strength;Decreased safety awareness;Decreased mobility;Decreased coordination;Decreased activity tolerance;Decreased cognition;Decreased balance;Decreased knowledge of use of DME       PT Treatment  Interventions DME instruction;Therapeutic exercise;Gait training;Functional mobility training;Therapeutic activities;Patient/family education;Balance training;Neuromuscular re-education;Cognitive remediation    PT Goals (Current goals can be found in the Care Plan section)  Acute Rehab PT Goals Patient Stated Goal: unable to state; noting sister seeking placement per notes PT Goal Formulation: With patient Time For Goal Achievement: 04/19/24 Potential to Achieve Goals: Good    Frequency Min 3X/week     Co-evaluation               AM-PAC PT 6 Clicks Mobility  Outcome Measure Help needed turning from your back to your side while in a flat bed without using bedrails?: A Little Help needed moving from lying on your back to sitting on the side of a flat bed without using bedrails?: A Lot (heavy use of bed rail needed) Help needed moving to and from a bed to a chair (including a wheelchair)?: A Lot Help needed standing up from a chair using your arms (e.g., wheelchair or bedside chair)?: A Lot (mod cues) Help needed to walk in hospital room?: Total Help needed climbing 3-5 steps with a railing? : Total 6  Click Score: 11    End of Session Equipment Utilized During Treatment: Gait belt Activity Tolerance: Patient tolerated treatment well Patient left: in chair;with chair alarm set;with call bell/phone within reach Nurse Communication: Mobility status PT Visit Diagnosis: Other abnormalities of gait and mobility (R26.89);Muscle weakness (generalized) (M62.81)    Time: 1042-1100 PT Time Calculation (min) (ACUTE ONLY): 18 min   Charges:   PT Evaluation $PT Eval Low Complexity: 1 Low   PT General Charges $$ ACUTE PT VISIT: 1 Visit         Benjiman, PT Acute Rehab [facility] [phone]   Benjiman VEAR Mulberry 04/05/2024, 11:17 AM

## 2024-04-05 NOTE — Evaluation (Signed)
 Occupational Therapy Evaluation Patient Details Name: Monica Mccoy MRN: [id number] DOB: 06-20-1966 Today's Date: 04/05/2024   History of Present Illness   Pt is 58 yo female admitted on 04/03/24 with complicated UTI and acute metabolic encephalopathy. Pt had CT head with no acute findings.  Pt does have breast CA with brain mets with hx of brain surgery and masectomy that is apparently in remission.  Other hx including but not limited to cognitive impairment, morbid obesity, HTN, CHF, seizure, GERD, and anxiety.     Clinical Impressions Patient is currently requiring as high as Maximum assistance with basic ADLs, as well as  minimal assist with bed mobility and up to moderate assist with functional transfers to stand and pivot from recliner to EOB with use of RW, but with decreased safety awareness and sitting too far from bed, requiring mod as and cues to safely make to EOB and avoid slide to floor.   Current level of function is below patient's typical baseline.    During this evaluation, patient was limited by expressive deficits and mild receptive deficits, decreased memory for recent events, generalized weakness, impaired activity tolerance, and obesity, all of which has the potential to impact patient's and/or caregivers' safety and independence during functional mobility, as well as performance for ADLs.    Patient demonstrates good rehab potential, and should benefit from continued skilled occupational therapy services while in acute care to maximize safety, independence and quality of life at home.  Continued occupational therapy services after discharge from acute care from continued inpatient follow up therapy, <3 hours/day is recommended.   ?      If plan is discharge home, recommend the following:   A lot of help with walking and/or transfers;A lot of help with bathing/dressing/bathroom;Two people to help with walking and/or transfers;Supervision due to cognitive status      Functional Status Assessment   Patient has had a recent decline in their functional status and demonstrates the ability to make significant improvements in function in a reasonable and predictable amount of time.     Equipment Recommendations    (Defer)     Recommendations for Other Services         Precautions/Restrictions   Precautions Precautions: Fall     Mobility Bed Mobility Overal bed mobility: Needs Assistance Bed Mobility: Sit to Supine       Sit to supine: Min assist, Used rails                                  Balance Overall balance assessment: Needs assistance Sitting-balance support: No upper extremity supported Sitting balance-Leahy Scale: Good     Standing balance support: Bilateral upper extremity supported Standing balance-Leahy Scale: Poor                             ADL either performed or assessed with clinical judgement   ADL Overall ADL's : Needs assistance/impaired Eating/Feeding: Set up;Supervision/ safety Eating/Feeding Details (indicate cue type and reason): Based on functional assessment and cognition. Grooming: Set up;Supervision/safety Grooming Details (indicate cue type and reason): Pt declined.  Would anticipate set up, Upper Body Bathing: Moderate assistance;Sitting   Lower Body Bathing: Maximal assistance;Sitting/lateral leans;Bed level   Upper Body Dressing : Minimal assistance;Cueing for sequencing;Bed level Upper Body Dressing Details (indicate cue type and reason): Gown change in supine. Lower Body Dressing: Maximal assistance;Sitting/lateral leans Lower  Body Dressing Details (indicate cue type and reason): Max As with socks. Pt can reach ankles but not toes. Toilet Transfer: Minimal assistance;Moderate assistance;Rolling walker (2 wheels);Cueing for sequencing;Cueing for safety;Stand-pivot Toilet Transfer Details (indicate cue type and reason): Pt stood from recliner x 3 to RW with  Min-Mod As. Pt iniitated return to recliner x2 for unknown reason. On 3rd stand pt stood with light Min As but required Mod As to safely pivot to EOB and sat too early. Pt educated on how to line up LEs before moving to sit. Pt did safely make it to EOB with the assist given. . Toileting- Clothing Manipulation and Hygiene: Maximal assistance;Total assistance;Bed level Toileting - Clothing Manipulation Details (indicate cue type and reason): pure wick.     Functional mobility during ADLs: Minimal assistance;Moderate assistance;Cueing for safety;Cueing for sequencing;Rolling walker (2 wheels)       Vision Ability to See in Adequate Light: 0 Adequate Vision Assessment?: No apparent visual deficits     Perception         Praxis         Pertinent Vitals/Pain Pain Assessment Pain Assessment: No/denies pain     Extremity/Trunk Assessment Upper Extremity Assessment Upper Extremity Assessment: Overall WFL for tasks assessed   Lower Extremity Assessment Lower Extremity Assessment: Overall WFL for tasks assessed;Difficult to assess due to impaired cognition (difficulty with MMT commands but demonstrating at least 3/5)   Cervical / Trunk Assessment Cervical / Trunk Assessment: Normal   Communication Communication Communication: Impaired Factors Affecting Communication: Difficulty expressing self;Reduced clarity of speech   Cognition Arousal: Alert Behavior During Therapy: [facility] for tasks assessed/performed                                 Following commands: Impaired Following commands impaired: Only follows one step commands consistently     Cueing  General Comments   Cueing Techniques: Verbal cues;Gestural cues;Visual cues  VSS   Exercises Other Exercises Other Exercises: Worked on yes/no questions to determine pt's receptive abilities as expressive deficits noted. Pt able to answer 8/10 yes/no questions correctly.  Pt able to state her name with increased  effort. DOB: repeating year a little off as 1957 rather than 1967.   Shoulder Instructions      Home Living Family/patient expects to be discharged to:: Private residence Living Arrangements: Other (Comment) (sister) Available Help at Discharge: Family                             Additional Comments: Pt not able to provide history.  Nurse confirmed pt lived with sister but sister reports she can not handle her like she is now.      Prior Functioning/Environment Prior Level of Function : Needs assist  Cognitive Assist : ADLs (cognitive)   ADLs (Cognitive): Step by step cues (Per 7 months ago as pt unable to give updated information.)       Mobility Comments: Not able to provide hx.  Last admission was ambulating with RW and stayed on main level of her home. ADLs Comments: Last admission was getting supervision for ADLs    OT Problem List: Obesity;Decreased knowledge of precautions;Decreased activity tolerance;Impaired balance (sitting and/or standing);Decreased safety awareness   OT Treatment/Interventions: Self-care/ADL training;Balance training;Therapeutic activities;Cognitive remediation/compensation;DME and/or AE instruction;Visual/perceptual remediation/compensation;Patient/family education      OT Goals(Current goals can be found in the care plan section)  Acute Rehab OT Goals OT Goal Formulation: Patient unable to participate in goal setting Time For Goal Achievement: 04/19/24 Potential to Achieve Goals: Good ADL Goals Pt Will Perform Grooming: standing;with supervision (for a tleast 1 task.) Pt Will Perform Lower Body Bathing: with adaptive equipment;sitting/lateral leans;sit to/from stand;with contact guard assist Pt Will Perform Lower Body Dressing: sitting/lateral leans;sit to/from stand;with adaptive equipment;with contact guard assist Pt Will Transfer to Toilet: with contact guard assist;bedside commode;stand pivot transfer Pt Will Perform  Toileting - Clothing Manipulation and hygiene: with adaptive equipment;with min assist;with caregiver independent in assisting;sitting/lateral leans;sit to/from stand   OT Frequency:  Min 2X/week    Co-evaluation              AM-PAC OT 6 Clicks Daily Activity     Outcome Measure Help from another person eating meals?: A Little Help from another person taking care of personal grooming?: A Little Help from another person toileting, which includes using toliet, bedpan, or urinal?: A Lot Help from another person bathing (including washing, rinsing, drying)?: A Lot Help from another person to put on and taking off regular upper body clothing?: A Little Help from another person to put on and taking off regular lower body clothing?: A Lot 6 Click Score: 15   End of Session Equipment Utilized During Treatment: Gait belt;Rolling walker (2 wheels) Nurse Communication: Mobility status  Activity Tolerance: Patient limited by fatigue Patient left: in bed;with call bell/phone within reach;with bed alarm set  OT Visit Diagnosis: Cognitive communication deficit (R41.841);Unsteadiness on feet (R26.81)                Time: 8749-8674 OT Time Calculation (min): 35 min Charges:  OT General Charges $OT Visit: 1 Visit OT Evaluation $OT Eval Moderate Complexity: 1 Mod OT Treatments $Therapeutic Activity: 8-22 mins  Delon, OT Acute Rehab Services Office: [phone] 04/05/2024   Delon Falter 04/05/2024, 1:47 PM

## 2024-04-06 ENCOUNTER — Encounter: Payer: Self-pay | Admitting: Hematology and Oncology

## 2024-04-06 DIAGNOSIS — N390 Urinary tract infection, site not specified: Secondary | ICD-10-CM | POA: Diagnosis not present

## 2024-04-06 LAB — URINE CULTURE: Culture: >=100000 — AB

## 2024-04-06 NOTE — Plan of Care (Signed)
  Problem: Health Behavior/Discharge Planning: Goal: Ability to manage health-related needs will improve Outcome: Progressing   Problem: Pain Managment: Goal: General experience of comfort will improve and/or be controlled Outcome: Progressing   Problem: Safety: Goal: Ability to remain free from injury will improve Outcome: Progressing

## 2024-04-06 NOTE — Progress Notes (Signed)
 Physical Therapy Treatment Patient Details Name: Monica Mccoy MRN: [id number] DOB: 1966-04-12 Today's Date: 04/06/2024   History of Present Illness Pt is 58 yo female admitted on 04/03/24 with complicated UTI and acute metabolic encephalopathy. Pt had CT head with no acute findings.  Pt does have breast CA with brain mets with hx of brain surgery and masectomy that is apparently in remission.  Other hx including but not limited to cognitive impairment, morbid obesity, HTN, CHF, seizure, GERD, and anxiety.    PT Comments  Patient  alert, oriented to self. Patient demonstrates decreased sitting balance and unable to  safely power up to stand at Rw this visit. Patient  cheerful and cooperative. Patient will benefit from continued inpatient follow up therapy, <3 hours/day     If plan is discharge home, recommend the following: Two people to help with bathing/dressing/bathroom;Two people to help with walking and/or transfers   Can travel by private Theme park manager cushion (measurements PT);Wheelchair (measurements PT);Rolling walker (2 wheels);BSC/3in1;Hospital bed    Recommendations for Other Services       Precautions / Restrictions Precautions Precautions: Fall Restrictions Weight Bearing Restrictions Per Provider Order: No     Mobility  Bed Mobility   Bed Mobility: Supine to Sit, Sit to Supine     Supine to sit: Min assist, HOB elevated, Used rails Sit to supine: Min assist, Used rails   General bed mobility comments: patient did move legs to bed edge, much difficulty  scooting to bed edge, patient able to place legs back onto bed and reach for headboard to assist to slide up in bed.    Transfers Overall transfer level: Needs assistance Equipment used: Rolling walker (2 wheels) Transfers: Sit to/from Stand             General transfer comment: attempted x 5 to stand from bed, unable to power up safely.    Ambulation/Gait                    Stairs             Wheelchair Mobility     Tilt Bed    Modified Rankin (Stroke Patients Only)       Balance Overall balance assessment: Needs assistance Sitting-balance support: Feet supported, No upper extremity supported Sitting balance-Leahy Scale: Poor Sitting balance - Comments: constantly leaning posterior, does not maintain static                                    Communication Communication Communication: Impaired Factors Affecting Communication: Difficulty expressing self;Reduced clarity of speech  Cognition Arousal: Alert Behavior During Therapy: WFL for tasks assessed/performed   PT - Cognitive impairments: History of cognitive impairments, No family/caregiver present to determine baseline, Orientation, Problem solving, Safety/Judgement, Awareness   Orientation impairments: Place, Time, Situation                   PT - Cognition Comments: Pt only oriented to self.  She often has non-sensical speech or word salad.  Pt very pleasant and cooperative - but only following 1 step commands and needs cues for sequencing/initiation Following commands: Impaired Following commands impaired: Follows one step commands inconsistently    Cueing Cueing Techniques: Verbal cues, Gestural cues, Visual cues  Exercises      General Comments        Pertinent Vitals/Pain  Pain Assessment Pain Assessment: No/denies pain    Home Living                          Prior Function            PT Goals (current goals can now be found in the care plan section) Progress towards PT goals: Progressing toward goals    Frequency    Min 3X/week      PT Plan      Co-evaluation              AM-PAC PT 6 Clicks Mobility   Outcome Measure  Help needed turning from your back to your side while in a flat bed without using bedrails?: A Little Help needed moving from lying on your back to sitting on the side of a  flat bed without using bedrails?: A Lot Help needed moving to and from a bed to a chair (including a wheelchair)?: Total Help needed standing up from a chair using your arms (e.g., wheelchair or bedside chair)?: Total Help needed to walk in hospital room?: Total Help needed climbing 3-5 steps with a railing? : Total 6 Click Score: 9    End of Session Equipment Utilized During Treatment: Gait belt Activity Tolerance: Patient tolerated treatment well Patient left: in bed;with call bell/phone within reach;with bed alarm set Nurse Communication: Mobility status PT Visit Diagnosis: Other abnormalities of gait and mobility (R26.89);Muscle weakness (generalized) (M62.81)     Time: 8377-8357 PT Time Calculation (min) (ACUTE ONLY): 20 min  Charges:    $Therapeutic Activity: 8-22 mins PT General Charges $$ ACUTE PT VISIT: 1 Visit                     Darice Potters PT Acute Rehabilitation Services Office [phone]    Potters Darice Norris 04/06/2024, 4:52 PM

## 2024-04-06 NOTE — [place] FL2 (Signed)
 [place]  MEDICAID FL2 LEVEL OF CARE FORM     IDENTIFICATION  Patient Name: Monica Mccoy Birthdate: 1966-01-21 Sex: female Admission Date (Current Location): 04/03/2024  [facility] and [place] Number:  Producer, television/film/video and Address:         Provider Number: [id number]  Attending Physician Name and Address:  Will Almarie MATSU, MD  Relative Name and Phone Number:       Current Level of Care: Hospital Recommended Level of Care: Skilled Nursing Facility Prior Approval Number:    Date Approved/Denied:   PASRR Number: [id number]  Discharge Plan: SNF    Current Diagnoses: Patient Active Problem List   Diagnosis Date Noted   Complicated UTI (urinary tract infection) 08/17/2023   Obesity, Class III, BMI 40-49.9 (morbid obesity) 06/16/2022   Palliative care encounter 06/09/2022   Goals of care, counseling/discussion 06/09/2022   Counseling and coordination of care 06/09/2022   Altered mental status 06/09/2022   Acute metabolic encephalopathy 06/08/2022   COVID-19 virus infection 06/08/2022   Generalized weakness 06/08/2022   Tendinitis of both ankles 04/25/2022   Pedal edema 04/13/2022   Cholelithiasis 10/16/2021   Uterine fibroid 10/16/2021   Aortic atherosclerosis (HCC) 10/16/2021   Chemotherapy-induced neuropathy (HCC) 09/11/2021   Localization-related (focal) (partial) symptomatic epilepsy and epileptic syndromes with complex partial seizures, not intractable, without status epilepticus (HCC) 09/11/2021   Essential hypertension 05/30/2021   Port-A-Cath in place 01/16/2021   Migraine without aura 12/19/2020   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) 09/22/2020   Gastroesophageal reflux disease without esophagitis 06/29/2020   Other chronic pain 06/29/2020   Moderate episode of recurrent major depressive disorder (HCC) 06/29/2020   Urinary incontinence 06/29/2020    Orientation RESPIRATION BLADDER Height & Weight     Self, Time, Situation, Place   Normal External catheter Weight: (!) 157.6 kg Height:  5' 6 (167.6 cm)  BEHAVIORAL SYMPTOMS/MOOD NEUROLOGICAL BOWEL NUTRITION STATUS    Convulsions/Seizures (controlled) Continent Diet  AMBULATORY STATUS COMMUNICATION OF NEEDS Skin   Limited Assist Verbally Normal                       Personal Care Assistance Level of Assistance  Bathing, Feeding, Dressing Bathing Assistance: Limited assistance Feeding assistance: Limited assistance Dressing Assistance: Limited assistance     Functional Limitations Info             SPECIAL CARE FACTORS FREQUENCY  PT (By licensed PT), OT (By licensed OT)     PT Frequency: 5x weekly OT Frequency: 5x weekly            Contractures Contractures Info: Not present    Additional Factors Info  Code Status, Allergies, Psychotropic Code Status Info: Full Allergies Info: Dilaudid (Hydromorphone Hcl), Morphine And Codeine Psychotropic Info: Effexor  XR,         Current Medications (04/06/2024):  This is the current hospital active medication list Current Facility-Administered Medications  Medication Dose Route Frequency Provider Last Rate Last Admin   acetaminophen  (TYLENOL ) tablet 650 mg  650 mg Oral Q6H PRN Gherghe, Costin M, MD       Or   acetaminophen  (TYLENOL ) suppository 650 mg  650 mg Rectal Q6H PRN Gherghe, Costin M, MD       amLODipine  (NORVASC ) tablet 5 mg  5 mg Oral Daily Gherghe, Costin M, MD   5 mg at 04/05/24 9072   cefTRIAXone  (ROCEPHIN ) 2 g in sodium chloride  0.9 % 100 mL IVPB  2 g Intravenous Q24H  Gherghe, Costin M, MD 200 mL/hr at 04/06/24 0958 2 g at 04/06/24 0958   divalproex  (DEPAKOTE ) DR tablet 250 mg  250 mg Oral BID Mathews, Elizabeth G, MD   250 mg at 04/05/24 2126   enoxaparin  (LOVENOX ) injection 80 mg  80 mg Subcutaneous Q24H Gherghe, Costin M, MD   80 mg at 04/06/24 9057   gabapentin  (NEURONTIN ) capsule 600 mg  600 mg Oral BID Mathews, Elizabeth G, MD   600 mg at 04/05/24 2127   ondansetron  (ZOFRAN ) tablet 4  mg  4 mg Oral Q6H PRN Gherghe, Costin M, MD       Or   ondansetron  (ZOFRAN ) injection 4 mg  4 mg Intravenous Q6H PRN Gherghe, Costin M, MD       venlafaxine  XR (EFFEXOR -XR) 24 hr capsule 75 mg  75 mg Oral Q breakfast Will Almarie MATSU, MD   75 mg at 04/05/24 9072     Discharge Medications: Please see discharge summary for a list of discharge medications.  Relevant Imaging Results:  Relevant Lab Results:   Additional Information SSN: [id number]  Doneta Glenys DASEN, RN

## 2024-04-06 NOTE — Plan of Care (Signed)

## 2024-04-06 NOTE — TOC Progression Note (Addendum)
 Transition of Care [facility]) - Progression Note    Patient Details  Name: Monica Mccoy MRN: [id number] Date of Birth: 08-01-1966  Transition of Care [facility]) CM/SW Contact  Doneta Glenys DASEN, RN Phone Number: 04/06/2024, 10:03 AM  Clinical Narrative:    CM met with patient and sister [name] to discuss PT recommendation for SNF and both are agreeable to starting SNF work up. 10:23 AM FL2, and referrals sent in the HUB. Waiting bed offers.  Expected Discharge Plan: Home/Self Care Barriers to Discharge: Continued Medical Work up               Expected Discharge Plan and Services In-house Referral: NA Discharge Planning Services: CM Consult Post Acute Care Choice: NA Living arrangements for the past 2 months: Apartment                 DME Arranged: N/A DME Agency: NA       HH Arranged: NA HH Agency: NA         Social Drivers of Health (SDOH) Interventions SDOH Screenings   Food Insecurity: Food Insecurity Present (04/03/2024)  Housing: Unknown (04/03/2024)  Transportation Needs: Unmet Transportation Needs (04/03/2024)  Utilities: At Risk (04/03/2024)  Alcohol Screen: Low Risk  (03/27/2022)  Depression (PHQ2-9): High Risk (05/21/2022)  Financial Resource Strain: Low Risk  (03/27/2022)  Physical Activity: Insufficiently Active (03/27/2022)  Social Connections: Feeling Socially Integrated (01/11/2023)   Received from [facility]  Stress: No Stress Concern Present (03/27/2022)  Tobacco Use: Low Risk  (04/03/2024)    Readmission Risk Interventions    08/19/2023   11:05 AM 06/11/2022   11:48 AM 06/10/2022   10:14 AM  Readmission Risk Prevention Plan  Post Dischage Appt Complete    Medication Screening Complete    Transportation Screening Complete Complete Complete  PCP or Specialist Appt within 5-7 Days  Complete Complete  Home Care Screening  Complete Complete  Medication Review (RN CM)  Complete Complete

## 2024-04-06 NOTE — Progress Notes (Signed)
 PROGRESS NOTE    Monica Mccoy  FMW:[id number] DOB: 11-22-1965 DOA: 04/03/2024 PCP: Pcp, No   Brief Narrative: 58 y.o. female with medical history significant of breast cancer with brain mets, apparently in remission, cognitive impairment, morbid obesity, hypertension, chronic diastolic CHF comes to the hospital with increased confusion over the last 1 to 2 weeks.  Apparently she has been having increased urination episodes, urine has been foul-smelling for few days.  I tried calling the family her brother at the number given twice in the computer apparently as a lawyer's office so I was not able to reach her brother.  Her urine still smells foul smelling and she remains confused.  I am not sure of her baseline. She has had multiple hospital admissions for the same.  Workup in the ED includes negative head CT for acute findings, no leukocytosis normal ammonia level.  ED physician was unable to contact the caregiver.  She was admitted with a diagnosis of complicated UTI with delirium.  Past urine cultures with E. coli and Streptococcus Galllyticus resistant to Cipro  and levofloxacin.   Assessment & Plan:   Principal Problem:   Complicated UTI (urinary tract infection)    #1 acute metabolic encephalopathy most likely related to urinary tract infection. she was admitted with fever and confusion and foul-smelling urine and frequency.  Mental status is significantly improved with antibiotics.  She is awake alert joking and talking and she is agreeable to go to rehab facility.  PT seen the patient recommending SNF.  2.  Essential hypertension-blood pressure 1 35/97 continue amlodipine  and Lasix .     3 Depression-on effexor    4 Hypokalemia-resolved.  Awake alert 5 Chronic diastolic CHF, pulmonary hypertension-continue home Lasix     6 History of urinary retention-on Myrbetriq  7 History of breast cancer-outpatient follow-up   8 History of seizure disorder- On Depakote , valproic  acid level added     9 Chemotherapy-induced neuropathy-on gabapentin   10 Obesity, morbid-BMI 56.  She would benefit from weight loss     Estimated body mass index is 56.08 kg/m as calculated from the following:   Height as of this encounter: 5' 6 (1.676 m).   Weight as of this encounter: 157.6 kg.  DVT prophylaxis: lovenox  Code Status:full Family Communication: None at bedside Disposition Plan:  Status pd:pwejupzwu NEED SNF ON DC   Consultants: none  Procedures: none Antimicrobials: rocephin   Subjective:  Sitting in bed no complaints no events overnight Objective: Vitals:   04/05/24 0536 04/05/24 1159 04/05/24 1936 04/06/24 1202  BP: 127/78 (!) 136/98 115/82 136/89  Pulse: 69 82 (!) 55 81  Resp: 17 20 20 18   Temp: 97.9 F (36.6 C) 98.3 F (36.8 C) 98.5 F (36.9 C) 98.4 F (36.9 C)  TempSrc: Oral Oral    SpO2: 97% 96% 98% 97%  Weight:      Height:        Intake/Output Summary (Last 24 hours) at 04/06/2024 1239 Last data filed at 04/06/2024 0430 Gross per 24 hour  Intake 480 ml  Output 330 ml  Net 150 ml   Filed Weights   04/03/24 1552  Weight: (!) 157.6 kg    Examination:  General exam: Appears calm and comfortable  Respiratory system: Clear to auscultation. Respiratory effort normal. Cardiovascular system: Regular not tachycardic  gastrointestinal system: Abdomen is nondistended, soft tender in the lower abdomen  Central nervous system: awake confused Extremities: trace edema  Data Reviewed: I have personally reviewed following labs and imaging studies  CBC: Recent Labs  Lab 04/03/24 1705 04/04/24 0518 04/05/24 1604  WBC 3.6* 4.0 4.2  HGB 13.1 12.1 12.2  HCT 42.6 39.5 38.9  MCV 91.0 90.6 90.0  PLT 194 199 176   Basic Metabolic Panel: Recent Labs  Lab 04/03/24 1705 04/04/24 0518 04/05/24 1604  NA 140 140 134*  K 3.4* 3.3* 3.9  CL 105 105 100  CO2 27 26 25   GLUCOSE 105* 113* 118*  BUN 10 10 10   CREATININE 0.69 0.63 0.66  CALCIUM  9.1 8.9 8.6*    GFR: Estimated Creatinine Clearance: 120.8 mL/min (by C-G formula based on SCr of 0.66 mg/dL). Liver Function Tests: Recent Labs  Lab 04/03/24 1705 04/04/24 0518 04/05/24 1604  AST 15 14* 17  ALT 12 10 12   ALKPHOS 82 77 79  BILITOT 1.0 1.2 0.7  PROT 7.5 7.0 6.9  ALBUMIN 3.6 3.3* 3.1*   No results for input(s): LIPASE, AMYLASE in the last 168 hours. Recent Labs  Lab 04/03/24 1704  AMMONIA 25   Coagulation Profile: No results for input(s): INR, PROTIME in the last 168 hours. Cardiac Enzymes: No results for input(s): CKTOTAL, CKMB, CKMBINDEX, TROPONINI in the last 168 hours. BNP (last 3 results) No results for input(s): PROBNP in the last 8760 hours. HbA1C: No results for input(s): HGBA1C in the last 72 hours. CBG: Recent Labs  Lab 04/03/24 1630  GLUCAP 95   Lipid Profile: No results for input(s): CHOL, HDL, LDLCALC, TRIG, CHOLHDL, LDLDIRECT in the last 72 hours. Thyroid  Function Tests: No results for input(s): TSH, T4TOTAL, FREET4, T3FREE, THYROIDAB in the last 72 hours. Anemia Panel: No results for input(s): VITAMINB12, FOLATE, FERRITIN, TIBC, IRON, RETICCTPCT in the last 72 hours. Sepsis Labs: No results for input(s): PROCALCITON, LATICACIDVEN in the last 168 hours.  Recent Results (from the past 240 hours)  Urine Culture     Status: Abnormal   Collection Time: 04/03/24  7:38 PM   Specimen: Urine, Clean Catch  Result Value Ref Range Status   Specimen Description   Final    URINE, CLEAN CATCH Performed at [facility], [address] [place], [place] [address]    Special Requests   Final    NONE Performed at [facility], [address], [place], [place] [address]    Culture >=100,000 COLONIES/mL ESCHERICHIA COLI (A)  Final   Report Status 04/06/2024 FINAL  Final   Organism ID, Bacteria ESCHERICHIA COLI (A)  Final      Susceptibility   Escherichia coli -  MIC*    AMPICILLIN <=2 SENSITIVE Sensitive     CEFAZOLIN <=4 SENSITIVE Sensitive     CEFEPIME <=0.12 SENSITIVE Sensitive     CEFTRIAXONE  <=0.25 SENSITIVE Sensitive     CIPROFLOXACIN  >=4 RESISTANT Resistant     GENTAMICIN <=1 SENSITIVE Sensitive     IMIPENEM <=0.25 SENSITIVE Sensitive     NITROFURANTOIN <=16 SENSITIVE Sensitive     TRIMETH /SULFA  <=20 SENSITIVE Sensitive     AMPICILLIN/SULBACTAM <=2 SENSITIVE Sensitive     PIP/TAZO <=4 SENSITIVE Sensitive ug/mL    * >=100,000 COLONIES/mL ESCHERICHIA COLI         Radiology Studies: No results found.       Scheduled Meds:  amLODipine   5 mg Oral Daily   divalproex   250 mg Oral BID   enoxaparin  (LOVENOX ) injection  80 mg Subcutaneous Q24H   gabapentin   600 mg Oral BID   venlafaxine  XR  75 mg Oral Q breakfast   Continuous Infusions:  cefTRIAXone  (ROCEPHIN )  IV  2 g (04/06/24 0958)     LOS: 2 days   Almarie KANDICE Hoots, MD  04/06/2024, 12:39 PM

## 2024-04-07 DIAGNOSIS — N390 Urinary tract infection, site not specified: Secondary | ICD-10-CM | POA: Diagnosis not present

## 2024-04-07 LAB — COMPREHENSIVE METABOLIC PANEL WITH GFR
ALT: 12 U/L (ref 0–44)
AST: 15 U/L (ref 15–41)
Albumin: 3.1 g/dL — ABNORMAL LOW (ref 3.5–5.0)
Alkaline Phosphatase: 76 U/L (ref 38–126)
Anion gap: 9 (ref 5–15)
BUN: 10 mg/dL (ref 6–20)
CO2: 27 mmol/L (ref 22–32)
Calcium: 9.2 mg/dL (ref 8.9–10.3)
Chloride: 101 mmol/L (ref 98–111)
Creatinine, Ser: 0.83 mg/dL (ref 0.44–1.00)
GFR, Estimated: >60 mL/min (ref >60)
Glucose, Bld: 137 mg/dL — ABNORMAL HIGH (ref 70–99)
Potassium: 3.8 mmol/L (ref 3.5–5.1)
Sodium: 137 mmol/L (ref 135–145)
Total Bilirubin: 0.8 mg/dL (ref 0.0–1.2)
Total Protein: 7.2 g/dL (ref 6.5–8.1)

## 2024-04-07 LAB — CBC
HCT: 39.3 % (ref 36.0–46.0)
Hemoglobin: 12.4 g/dL (ref 12.0–15.0)
MCH: 28.5 pg (ref 26.0–34.0)
MCHC: 31.6 g/dL (ref 30.0–36.0)
MCV: 90.3 fL (ref 80.0–100.0)
Platelets: 193 K/uL (ref 150–400)
RBC: 4.35 MIL/uL (ref 3.87–5.11)
RDW: 13.8 % (ref 11.5–15.5)
WBC: 3.4 K/uL — ABNORMAL LOW (ref 4.0–10.5)
nRBC: 0 % (ref 0.0–0.2)

## 2024-04-07 LAB — GLUCOSE, CAPILLARY: Glucose-Capillary: 142 mg/dL — ABNORMAL HIGH (ref 70–99)

## 2024-04-07 MED ORDER — AMOXICILLIN 500 MG PO CAPS
500.0000 mg | ORAL_CAPSULE | Freq: Three times a day (TID) | ORAL | Status: AC
  Administered 2024-04-08 (×6): 500 mg via ORAL
  Filled 2024-04-07 (×3): qty 1

## 2024-04-07 NOTE — Progress Notes (Signed)
   04/07/24 1515  Spiritual Encounters  Type of Visit Follow up  Care provided to: Pt and family  Reason for visit Advance directives  OnCall Visit No   Chaplain met with family and went over AD paperwork, I then reached out to a notary and witnesses. Documents were signed, copies made. One copy was put in the hard file and documents were sent to ACP documents to be added to the patients chart.   Carley Birmingham [facility]  [phone]

## 2024-04-07 NOTE — TOC Progression Note (Addendum)
 Transition of Care [facility]) - Progression Note    Patient Details  Name: Monica Mccoy MRN: [id number] Date of Birth: 20-Jul-1966  Transition of Care [facility]) CM/SW Contact  Doneta Glenys DASEN, RN Phone Number: 04/07/2024, 9:43 AM  Clinical Narrative:    CM spoke with several SNF wanting information on patients insurance. CM called sister [name] Ill [phone]. CM will contacy Chaplain to assist with [facility] POA forms. CM spoke with patients daughter Cricket [phone] states that patients sister known everything I know.  Expected Discharge Plan: Home/Self Care Barriers to Discharge: Continued Medical Work up               Expected Discharge Plan and Services In-house Referral: NA Discharge Planning Services: CM Consult Post Acute Care Choice: NA Living arrangements for the past 2 months: Apartment                 DME Arranged: N/A DME Agency: NA       HH Arranged: NA HH Agency: NA         Social Drivers of Health (SDOH) Interventions SDOH Screenings   Food Insecurity: Food Insecurity Present (04/03/2024)  Housing: Unknown (04/03/2024)  Transportation Needs: Unmet Transportation Needs (04/03/2024)  Utilities: At Risk (04/03/2024)  Alcohol Screen: Low Risk  (03/27/2022)  Depression (PHQ2-9): High Risk (05/21/2022)  Financial Resource Strain: Low Risk  (03/27/2022)  Physical Activity: Insufficiently Active (03/27/2022)  Social Connections: Feeling Socially Integrated (01/11/2023)   Received from [facility]  Stress: No Stress Concern Present (03/27/2022)  Tobacco Use: Low Risk  (04/03/2024)    Readmission Risk Interventions    08/19/2023   11:05 AM 06/11/2022   11:48 AM 06/10/2022   10:14 AM  Readmission Risk Prevention Plan  Post Dischage Appt Complete    Medication Screening Complete    Transportation Screening Complete Complete Complete  PCP or Specialist Appt within 5-7 Days  Complete Complete  Home Care Screening  Complete Complete  Medication Review (RN CM)  Complete  Complete

## 2024-04-07 NOTE — Plan of Care (Signed)
  Problem: Clinical Measurements: Goal: Diagnostic test results will improve Outcome: Progressing Goal: Cardiovascular complication will be avoided Outcome: Progressing   Problem: Activity: Goal: Risk for activity intolerance will decrease Outcome: Progressing   Problem: Nutrition: Goal: Adequate nutrition will be maintained Outcome: Progressing   Problem: Elimination: Goal: Will not experience complications related to bowel motility Outcome: Progressing

## 2024-04-07 NOTE — Progress Notes (Signed)
   04/07/24 1330  Spiritual Encounters  Type of Visit Initial  Care provided to: Pt and family  Referral source Other (comment) (Spiritual COnsult)  Reason for visit Advance directives  OnCall Visit No   Chaplain responded to a spiritual consult for advanced directive education.  I met with the patient, Monica Mccoy who was alert and welcomed me into her space.  The patient's sister, Monica Mccoy was also present and another sister was expected. I explained to [name] what the advanced directive is and why it is an important part of her care plan. . She acknowledged that she was interested in completing the documents. I provided the documents to her and explained the steps that will follow once the documents are completed.which [name] and her sister acknowledged.  If any questions arise please reach out to your nurse and some one will respond.   Carley Birmingham [facility]  [phone]

## 2024-04-07 NOTE — Plan of Care (Signed)

## 2024-04-07 NOTE — Progress Notes (Signed)
 PROGRESS NOTE    Monica Mccoy  FMW:[id number] DOB: [birth year] DOA: 04/03/2024 PCP: Pcp, No   Brief Narrative: 58 y.o. female with medical history significant of breast cancer with brain mets, apparently in remission, cognitive impairment, morbid obesity, hypertension, chronic diastolic CHF comes to the hospital with increased confusion over the last 1 to 2 weeks.  Apparently she has been having increased urination episodes, urine has been foul-smelling for few days.  I tried calling the family her brother at the number given twice in the computer apparently as a lawyer's office so I was not able to reach her brother.  Her urine still smells foul smelling and she remains confused.  I am not sure of her baseline. She has had multiple hospital admissions for the same.  Workup in the ED includes negative head CT for acute findings, no leukocytosis normal ammonia level.  ED physician was unable to contact the caregiver.  She was admitted with a diagnosis of complicated UTI with delirium.  Past urine cultures with E. coli and Streptococcus Galllyticus resistant to Cipro  and levofloxacin.   Assessment & Plan:   Principal Problem:   Complicated UTI (urinary tract infection)    #1 acute metabolic encephalopathy most likely related to urinary tract infection. she was admitted with fever and confusion and foul-smelling urine and frequency.  Mental status is significantly improved with antibiotics.  She is awake alert joking and talking and she is agreeable to go to rehab facility.  PT seen the patient recommending SNF.  2.  Essential hypertension-blood pressure 1 35/97 continue amlodipine  and Lasix .     3 Depression-on effexor    4 Hypokalemia-resolved.  Awake alert 5 Chronic diastolic CHF, pulmonary hypertension-continue home Lasix     6 History of urinary retention-on Myrbetriq  7 History of breast cancer-outpatient follow-up   8 History of seizure disorder- On Depakote , valproic  acid level added     9 Chemotherapy-induced neuropathy-on gabapentin   10 Obesity, morbid-BMI 56.  She would benefit from weight loss     Estimated body mass index is 56.08 kg/m as calculated from the following:   Height as of this encounter: 5' 6 (1.676 m).   Weight as of this encounter: 157.6 kg.  DVT prophylaxis: lovenox  Code Status:full Family Communication: None at bedside Disposition Plan:  Status pd:pwejupzwu NEED SNF ON DC   Consultants: none  Procedures: none Antimicrobials: rocephin   Subjective:   Await snf No new events  Objective: Vitals:   04/06/24 2023 04/07/24 0309 04/07/24 0941 04/07/24 1155  BP: 114/81 (!) 127/90 118/80 122/88  Pulse: 80 88  92  Resp:  18  16  Temp: 98.4 F (36.9 C) 99.3 F (37.4 C) 98.9 F (37.2 C) 98.6 F (37 C)  TempSrc: Oral Oral Oral Oral  SpO2: 98% 93%  93%  Weight:      Height:        Intake/Output Summary (Last 24 hours) at 04/07/2024 1454 Last data filed at 04/07/2024 0946 Gross per 24 hour  Intake 240 ml  Output --  Net 240 ml   Filed Weights   04/03/24 1552  Weight: (!) 157.6 kg    Examination:  General exam: Appears calm and comfortable  Respiratory system: Clear to auscultation. Respiratory effort normal. Cardiovascular system: Regular not tachycardic  gastrointestinal system: Abdomen is nondistended, soft tender in the lower abdomen  Central nervous system: awake confused Extremities: trace edema  Data Reviewed: I have personally reviewed following labs and imaging studies  CBC: Recent Labs  Lab 04/03/24 1705  04/04/24 0518 04/05/24 1604  WBC 3.6* 4.0 4.2  HGB 13.1 12.1 12.2  HCT 42.6 39.5 38.9  MCV 91.0 90.6 90.0  PLT 194 199 176   Basic Metabolic Panel: Recent Labs  Lab 04/03/24 1705 04/04/24 0518 04/05/24 1604  NA 140 140 134*  K 3.4* 3.3* 3.9  CL 105 105 100  CO2 27 26 25   GLUCOSE 105* 113* 118*  BUN 10 10 10   CREATININE 0.69 0.63 0.66  CALCIUM  9.1 8.9 8.6*   GFR: Estimated Creatinine  Clearance: 120.8 mL/min (by C-G formula based on SCr of 0.66 mg/dL). Liver Function Tests: Recent Labs  Lab 04/03/24 1705 04/04/24 0518 04/05/24 1604  AST 15 14* 17  ALT 12 10 12   ALKPHOS 82 77 79  BILITOT 1.0 1.2 0.7  PROT 7.5 7.0 6.9  ALBUMIN 3.6 3.3* 3.1*   No results for input(s): LIPASE, AMYLASE in the last 168 hours. Recent Labs  Lab 04/03/24 1704  AMMONIA 25   Coagulation Profile: No results for input(s): INR, PROTIME in the last 168 hours. Cardiac Enzymes: No results for input(s): CKTOTAL, CKMB, CKMBINDEX, TROPONINI in the last 168 hours. BNP (last 3 results) No results for input(s): PROBNP in the last 8760 hours. HbA1C: No results for input(s): HGBA1C in the last 72 hours. CBG: Recent Labs  Lab 04/03/24 1630 04/07/24 1157  GLUCAP 95 142*   Lipid Profile: No results for input(s): CHOL, HDL, LDLCALC, TRIG, CHOLHDL, LDLDIRECT in the last 72 hours. Thyroid  Function Tests: No results for input(s): TSH, T4TOTAL, FREET4, T3FREE, THYROIDAB in the last 72 hours. Anemia Panel: No results for input(s): VITAMINB12, FOLATE, FERRITIN, TIBC, IRON, RETICCTPCT in the last 72 hours. Sepsis Labs: No results for input(s): PROCALCITON, LATICACIDVEN in the last 168 hours.  Recent Results (from the past 240 hours)  Urine Culture     Status: Abnormal   Collection Time: 04/03/24  7:38 PM   Specimen: Urine, Clean Catch  Result Value Ref Range Status   Specimen Description   Final    URINE, CLEAN CATCH Performed at [facility], [address], [place], [place] [address]    Special Requests   Final    NONE Performed at [facility], [address] [place], [place] [address]    Culture >=100,000 COLONIES/mL ESCHERICHIA COLI (A)  Final   Report Status 04/06/2024 FINAL  Final   Organism ID, Bacteria ESCHERICHIA COLI (A)  Final      Susceptibility   Escherichia coli - MIC*     AMPICILLIN <=2 SENSITIVE Sensitive     CEFAZOLIN <=4 SENSITIVE Sensitive     CEFEPIME <=0.12 SENSITIVE Sensitive     CEFTRIAXONE  <=0.25 SENSITIVE Sensitive     CIPROFLOXACIN  >=4 RESISTANT Resistant     GENTAMICIN <=1 SENSITIVE Sensitive     IMIPENEM <=0.25 SENSITIVE Sensitive     NITROFURANTOIN <=16 SENSITIVE Sensitive     TRIMETH /SULFA  <=20 SENSITIVE Sensitive     AMPICILLIN/SULBACTAM <=2 SENSITIVE Sensitive     PIP/TAZO <=4 SENSITIVE Sensitive ug/mL    * >=100,000 COLONIES/mL ESCHERICHIA COLI     Radiology Studies: No results found.   Scheduled Meds:  amLODipine   5 mg Oral Daily   [START ON 04/08/2024] amoxicillin   500 mg Oral Q8H   divalproex   250 mg Oral BID   enoxaparin  (LOVENOX ) injection  80 mg Subcutaneous Q24H   gabapentin   600 mg Oral BID   venlafaxine  XR  75 mg Oral Q breakfast   Continuous Infusions:  LOS: 3 days   Almarie KANDICE Hoots, MD  04/07/2024, 2:54 PM

## 2024-04-08 DIAGNOSIS — N390 Urinary tract infection, site not specified: Secondary | ICD-10-CM | POA: Diagnosis not present

## 2024-04-08 MED ORDER — AMOXICILLIN 500 MG PO CAPS: 500.0000 mg | ORAL_CAPSULE | Freq: Three times a day (TID) | ORAL | 0 refills | Status: DC

## 2024-04-08 NOTE — Plan of Care (Signed)

## 2024-04-08 NOTE — TOC Progression Note (Addendum)
 Transition of Care [facility]) - Progression Note    Patient Details  Name: Monica Mccoy MRN: [id number] Date of Birth: 1966/04/10  [facility]) CM/SW Contact  Doneta Glenys DASEN, RN Phone Number: 04/08/2024, 11:58 AM  Clinical Narrative:    [facility] POA paper work completed and added to shadow chart. No bed offers due to out of state insurance. Waiting on palliative consult. Patient may have to be discharge with [facility] PT/OT/aid/nurse/SW if and agency will accept patient. 3:00 PM CM reached out to [employer] not in network and [facility] - previous patient Amy say will service if in network. 3:06 PM CM called [name] Ill sister [phone]. Informed her that patient is discharged and transportation will be arranged with [facility]. 3:59 PM CM called to inform [name] that the discharge was cancelled. CM informed [name] that a hoyer lift and hospital bed has been ordered via [facility]. [name] refused the hospital bed due to no space in the apartment. [name] informed CM that patients health insurance has been cancelled in [place]  and have started an application for [place] Medicaid.   Expected Discharge Plan: Skilled Nursing Facility Barriers to Discharge: SNF Pending bed offer, Inadequate or no insurance               Expected Discharge Plan and Services In-house Referral: NA Discharge Planning Services: CM Consult Post Acute Care Choice: NA Living arrangements for the past 2 months: Apartment                 DME Arranged: N/A DME Agency: NA       HH Arranged: NA HH Agency: NA         Social Drivers of Health (SDOH) Interventions SDOH Screenings   Food Insecurity: Food Insecurity Present (04/03/2024)  Housing: Unknown (04/03/2024)  Transportation Needs: Unmet Transportation Needs (04/03/2024)  Utilities: At Risk (04/03/2024)  Alcohol Screen: Low Risk  (03/27/2022)  Depression (PHQ2-9): High Risk (05/21/2022)  Financial Resource Strain: Low Risk  (03/27/2022)  Physical  Activity: Insufficiently Active (03/27/2022)  Social Connections: Feeling Socially Integrated (01/11/2023)   Received from [facility]  Stress: No Stress Concern Present (03/27/2022)  Tobacco Use: Low Risk  (04/03/2024)    Readmission Risk Interventions    08/19/2023   11:05 AM 06/11/2022   11:48 AM 06/10/2022   10:14 AM  Readmission Risk Prevention Plan  Post Dischage Appt Complete    Medication Screening Complete    Transportation Screening Complete Complete Complete  PCP or Specialist Appt within 5-7 Days  Complete Complete  Home Care Screening  Complete Complete  Medication Review (RN CM)  Complete Complete

## 2024-04-08 NOTE — Progress Notes (Signed)
 PROGRESS NOTE    Monica Mccoy  FMW:[id number] DOB: 1965/10/14 DOA: 04/03/2024 PCP: Pcp, No   Brief Narrative: 58 y.o. female with medical history significant of breast cancer with brain mets, apparently in remission, cognitive impairment, morbid obesity, hypertension, chronic diastolic CHF comes to the hospital with increased confusion over the last 1 to 2 weeks.  Apparently she has been having increased urination episodes, urine has been foul-smelling for few days.  I tried calling the family her brother at the number given twice in the computer apparently as a lawyer's office so I was not able to reach her brother.  Her urine still smells foul smelling and she remains confused.  I am not sure of her baseline. She has had multiple hospital admissions for the same.  Workup in the ED includes negative head CT for acute findings, no leukocytosis normal ammonia level.  ED physician was unable to contact the caregiver.  She was admitted with a diagnosis of complicated UTI with delirium.  Past urine cultures with E. coli and Streptococcus Galllyticus resistant to Cipro  and levofloxacin.   Assessment & Plan:   Principal Problem:   Complicated UTI (urinary tract infection)    #1 acute metabolic encephalopathy resolved.  most likely related to urinary tract infection. she was admitted with fever and confusion and foul-smelling urine and frequency.  Mental status is significantly improved with antibiotics.  She is awake alert joking and talking and she is agreeable to go to rehab facility.  PT seen the patient recommending SNF. TOC working on it.  2.  Essential hypertension-blood pressure 1 35/97 continue amlodipine  and Lasix .     3 Depression-on effexor    4 Hypokalemia-resolved.  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  She would benefit from weight loss     Estimated body mass index is 56.08 kg/m as calculated from the following:   Height as of this encounter: 5' 6 (1.676 m).   Weight as of this encounter: 157.6 kg.  DVT prophylaxis: lovenox  Code Status:full Family Communication: None at bedside Disposition Plan:  Status pd:[id number] NEED SNF ON DC   Consultants: none  Procedures: none Antimicrobials: Amoxicillin  Subjective:  Await snf   Objective: Vitals:   04/07/24 2028 04/08/24 0345 04/08/24 1056 04/08/24 1258  BP: (!) 145/86 111/78 123/81 115/78  Pulse: 93 74 92 89  Resp:  18  16  Temp: 98.7 F (37.1 C) 98.8 F (37.1 C)  98.1 F (36.7 C)  TempSrc:  Oral    SpO2: 100% 93%  99%  Weight:      Height:        Intake/Output Summary (Last 24 hours) at 04/08/2024 1609 Last data filed at 04/08/2024 0558 Gross per 24 hour  Intake 0 ml  Output 500 ml  Net -500 ml   Filed Weights   04/03/24 1552  Weight: (!) 157.6 kg    Examination:  General exam: Appears calm and comfortable  Respiratory system: Clear to auscultation. Respiratory effort normal. Cardiovascular system: Regular not tachycardic  gastrointestinal system: Abdomen is nondistended, soft tender in the lower abdomen  Central nervous system: awake confused Extremities: trace edema  Data Reviewed: I have personally reviewed following labs and imaging studies  CBC: Recent Labs  Lab 04/03/24 1705  04/04/24 0518 04/05/24 1604 04/07/24 1525  WBC 3.6* 4.0 4.2 3.4*  HGB 13.1 12.1 12.2 12.4  HCT 42.6 39.5 38.9 39.3  MCV 91.0 90.6 90.0 90.3  PLT 194 199 176 193   Basic Metabolic Panel: Recent Labs  Lab 04/03/24 1705 04/04/24 0518 04/05/24 1604 04/07/24 1525  NA 140 140 134* 137  K 3.4* 3.3* 3.9 3.8  CL 105 105 100 101  CO2 27 26 25 27   GLUCOSE 105* 113* 118* 137*  BUN 10 10 10 10    CREATININE 0.69 0.63 0.66 0.83  CALCIUM  9.1 8.9 8.6* 9.2   GFR: Estimated Creatinine Clearance: 116.4 mL/min (by C-G formula based on SCr of 0.83 mg/dL). Liver Function Tests: Recent Labs  Lab 04/03/24 1705 04/04/24 0518 04/05/24 1604 04/07/24 1525  AST 15 14* 17 15  ALT 12 10 12 12   ALKPHOS 82 77 79 76  BILITOT 1.0 1.2 0.7 0.8  PROT 7.5 7.0 6.9 7.2  ALBUMIN 3.6 3.3* 3.1* 3.1*   No results for input(s): LIPASE, AMYLASE in the last 168 hours. Recent Labs  Lab 04/03/24 1704  AMMONIA 25   Coagulation Profile: No results for input(s): INR, PROTIME in the last 168 hours. Cardiac Enzymes: No results for input(s): CKTOTAL, CKMB, CKMBINDEX, TROPONINI in the last 168 hours. BNP (last 3 results) No results for input(s): PROBNP in the last 8760 hours. HbA1C: No results for input(s): HGBA1C in the last 72 hours. CBG: Recent Labs  Lab 04/03/24 1630 04/07/24 1157  GLUCAP 95 142*   Lipid Profile: No results for input(s): CHOL, HDL, LDLCALC, TRIG, CHOLHDL, LDLDIRECT in the last 72 hours. Thyroid  Function Tests: No results for input(s): TSH, T4TOTAL, FREET4, T3FREE, THYROIDAB in the last 72 hours. Anemia Panel: No results for input(s): VITAMINB12, FOLATE, FERRITIN, TIBC, IRON, RETICCTPCT in the last 72 hours. Sepsis Labs: No results for input(s): PROCALCITON, LATICACIDVEN in the last 168 hours.  Recent Results (from the past 240 hours)  Urine Culture     Status: Abnormal   Collection Time: 04/03/24  7:38 PM   Specimen: Urine, Clean Catch  Result Value Ref Range Status   Specimen Description   Final    URINE, CLEAN CATCH Performed at [facility], [address], [place], [place] [address]    Special Requests   Final    NONE Performed at [facility], [address], [place], [place] [address]    Culture >=100,000 COLONIES/mL ESCHERICHIA COLI (A)  Final   Report Status  04/06/2024 FINAL  Final   Organism ID, Bacteria ESCHERICHIA COLI (A)  Final      Susceptibility   Escherichia coli - MIC*    AMPICILLIN <=2 SENSITIVE Sensitive     CEFAZOLIN <=4 SENSITIVE Sensitive     CEFEPIME <=0.12 SENSITIVE Sensitive     CEFTRIAXONE  <=0.25 SENSITIVE Sensitive     CIPROFLOXACIN  >=4 RESISTANT Resistant     GENTAMICIN <=1 SENSITIVE Sensitive     IMIPENEM <=0.25 SENSITIVE Sensitive     NITROFURANTOIN <=16 SENSITIVE Sensitive     TRIMETH /SULFA  <=20 SENSITIVE Sensitive     AMPICILLIN/SULBACTAM <=2 SENSITIVE Sensitive     PIP/TAZO <=4 SENSITIVE Sensitive ug/mL    * >=100,000 COLONIES/mL ESCHERICHIA COLI     Radiology Studies: No results found.   Scheduled Meds:  amLODipine   5 mg Oral Daily   amoxicillin   500 mg Oral Q8H   divalproex   250 mg Oral BID   enoxaparin  (LOVENOX ) injection  80 mg Subcutaneous Q24H  gabapentin   600 mg Oral BID   venlafaxine  XR  75 mg Oral Q breakfast   Continuous Infusions:   LOS: 4 days   Almarie KANDICE Hoots, MD  04/08/2024, 4:09 PM

## 2024-04-08 NOTE — Progress Notes (Addendum)
 Physical Therapy Treatment Patient Details Name: Monica Mccoy MRN: [id number] DOB: 01/10/1966 Today's Date: 04/08/2024   History of Present Illness Pt is 58 yo female admitted on 04/03/24 with complicated UTI and acute metabolic encephalopathy. Pt had CT head with no acute findings.  Pt does have breast CA with brain mets with hx of brain surgery and masectomy that is apparently in remission.  Other hx including but not limited to cognitive impairment, morbid obesity, HTN, CHF, seizure, GERD, and anxiety.    PT Comments   Pt admitted with above diagnosis. Pt currently with functional limitations due to the deficits listed below (see PT Problem List). Pt in bed when therapist arrived. Pt agreeable to therapy intervention. Pt indicated that she had been OOB and sitting up in recliner earlier today. Pt is a poor historian and PT later communicated with nursing staff whom reports pt has not been OOB with nursing. PT was able to provide nursing ed if pt is to get OOB need for total lift. Pt required max A x 2 to complete supine to sit to EOB with use of hospital bed, pt is able to initiate B LE movement and trunk flexion, pt required mod A x 2 for sit to stand  from elevated EOB, min A x 2 for initial standing balance at RW with B UE support and repetitive multimodal cues for extension posture, set up and facilitation for SPT to recliner, pt exhibited difficulty with B LE advancement and required assist for weight shifting and visual/verbal cues for LE placement with A to manage RW, during the course of the transfer task pt quickly faded to flexion posture and unable to implement self recovery strategies requiring total A x 2 for safe decent to sitting surface, pt subsequently required total A x 2 to reposition from EOB and max A x 2 with pt initiating R LE into bed to complete sit to supine pt is able to engage with bed mobility to reposition. Pt left in bed, in chair position set up for lunch and all needs in  place. Pt will benefit from acute skilled PT to increase their independence and safety with mobility to allow discharge.      If plan is discharge home, recommend the following: Two people to help with bathing/dressing/bathroom;Two people to help with walking and/or transfers;Assistance with cooking/housework;Assist for transportation;Help with stairs or ramp for entrance;Supervision due to cognitive status   Can travel by private vehicle     No  Equipment Recommendations  Wheelchair cushion (measurements PT);Wheelchair (measurements PT);Rolling walker (2 wheels);BSC/3in1;Hospital bed    Recommendations for Other Services       Precautions / Restrictions Precautions Precautions: Fall Restrictions Weight Bearing Restrictions Per Provider Order: No     Mobility  Bed Mobility Overal bed mobility: Needs Assistance Bed Mobility: Supine to Sit, Sit to Supine, Rolling Rolling: Min assist, Used rails   Supine to sit: HOB elevated, Used rails, Max assist, +2 for physical assistance, +2 for safety/equipment Sit to supine: Max assist, +2 for physical assistance, +2 for safety/equipment   General bed mobility comments: pt able to initiate B LE to EOB and trunk flexion with use of hospital bed pt required max A x 2 with bed pad to complete scoot to EOB and CGA to min A for sitting balance, pt required max A x 2 for sit to supine with pt able to initiate R LE to return to bed and once in supine pt is able to engage to reposition and  roll side to side with bed rails and min A    Transfers Overall transfer level: Needs assistance Equipment used: Rolling walker (2 wheels) Transfers: Sit to/from Stand Sit to Stand: From elevated surface, +2 physical assistance, +2 safety/equipment, Total assist, Mod assist           General transfer comment: pt required mod A x 2 for sit to stand from elevated EOB with pulling up on RW, multimodal cues for extension posture and to complete power up, once in  standing min A x 2 with B UE support at RW, PT initiated LE advacement for SPT to recliner during the course of transfer tasks pt required A to manage RW, strong cues for LE placement and facilitation for weight shifting however with fatigue pt quickly faded into flexion and unable to complete transfer to recliner thus requiring total A x 2 for fall prevention and to assist pt to sitting EOB, pt unable to initiate retrograde scooting on EOB and required total A x 2 with rehab tech assisting with trunk and PT with B LE to scoot posteriorly back into the center of the bed and limit risk of fall    Ambulation/Gait               General Gait Details: NT   Stairs             Wheelchair Mobility     Tilt Bed    Modified Rankin (Stroke Patients Only)       Balance Overall balance assessment: Needs assistance Sitting-balance support: Feet supported, No upper extremity supported Sitting balance-Leahy Scale: Fair Sitting balance - Comments: constantly leaning posterior and R lateral, difficulty attending to midline and facilitation for trunk flexion with CGA to min A   Standing balance support: Bilateral upper extremity supported, During functional activity, Reliant on assistive device for balance Standing balance-Leahy Scale: Zero                              Communication Communication Communication: Impaired Factors Affecting Communication: Difficulty expressing self;Reduced clarity of speech  Cognition Arousal: Alert Behavior During Therapy: WFL for tasks assessed/performed   PT - Cognitive impairments: History of cognitive impairments, No family/caregiver present to determine baseline, Orientation, Problem solving, Safety/Judgement, Awareness   Orientation impairments: Place, Time, Situation                   PT - Cognition Comments: Pt only oriented to self.  She often has non-sensical speech or word salad.  Pt very pleasant and cooperative - but  only following 1 step commands and needs cues for sequencing/initiation Following commands: Impaired Following commands impaired: Follows one step commands inconsistently    Cueing Cueing Techniques: Verbal cues, Gestural cues, Visual cues, Tactile cues  Exercises      General Comments        Pertinent Vitals/Pain      Home Living                          Prior Function            PT Goals (current goals can now be found in the care plan section) Acute Rehab PT Goals Patient Stated Goal: unable to state; noting sister seeking placement per notes PT Goal Formulation: With patient Time For Goal Achievement: 04/19/24 Potential to Achieve Goals: Good Progress towards PT goals: Progressing toward goals (slow)  Frequency    Min 3X/week      PT Plan      Co-evaluation              AM-PAC PT 6 Clicks Mobility   Outcome Measure  Help needed turning from your back to your side while in a flat bed without using bedrails?: A Little Help needed moving from lying on your back to sitting on the side of a flat bed without using bedrails?: A Lot Help needed moving to and from a bed to a chair (including a wheelchair)?: Total Help needed standing up from a chair using your arms (e.g., wheelchair or bedside chair)?: Total Help needed to walk in hospital room?: Total Help needed climbing 3-5 steps with a railing? : Total 6 Click Score: 9    End of Session Equipment Utilized During Treatment: Gait belt Activity Tolerance: Patient limited by fatigue Patient left: in bed;with call bell/phone within reach;with bed alarm set Nurse Communication: Mobility status;Need for lift equipment PT Visit Diagnosis: Other abnormalities of gait and mobility (R26.89);Muscle weakness (generalized) (M62.81)     Time: 8864-8846 PT Time Calculation (min) (ACUTE ONLY): 18 min  Charges:    $Therapeutic Activity: 8-22 mins PT General Charges $$ ACUTE PT VISIT: 1 Visit                      [place], PT Acute Rehab    [place] VEAR Drone 04/08/2024, 12:34 PM

## 2024-04-09 DIAGNOSIS — R5381 Other malaise: Secondary | ICD-10-CM

## 2024-04-09 DIAGNOSIS — Z515 Encounter for palliative care: Secondary | ICD-10-CM

## 2024-04-09 DIAGNOSIS — Z7189 Other specified counseling: Secondary | ICD-10-CM

## 2024-04-09 DIAGNOSIS — R52 Pain, unspecified: Secondary | ICD-10-CM

## 2024-04-09 DIAGNOSIS — C50919 Malignant neoplasm of unspecified site of unspecified female breast: Secondary | ICD-10-CM

## 2024-04-09 DIAGNOSIS — N390 Urinary tract infection, site not specified: Secondary | ICD-10-CM | POA: Diagnosis not present

## 2024-04-09 NOTE — Plan of Care (Signed)

## 2024-04-09 NOTE — Progress Notes (Signed)
 PROGRESS NOTE    Monica Mccoy  FMW:[id number] DOB: [birth year] DOA: 04/03/2024 PCP: Pcp, No   Brief Narrative:   58 y.o. female with medical history significant of breast cancer with brain mets, apparently in remission, cognitive impairment, morbid obesity, hypertension, chronic diastolic CHF comes to the hospital with increased confusion over the last 1 to 2 weeks.  Apparently she has been having increased urination episodes, urine has been foul-smelling for few days. Mentation is back to baseline now. Awaiting SNF placement.  Assessment & Plan:  Principal Problem:   Complicated UTI (urinary tract infection)    Acute metabolic encephalopathy: resolved. No evidence of acute stroke likely related to urinary tract infection.  She is awake, alert and agreeable to go to rehab facility.   PT seen the patient recommending SNF. TOC working on it.   Essential hypertension-continue amlodipine  and Lasix .     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 Bed bound. May benefit from anti-obesity medications as she does have obesity related complications (Adiposopathy)  DVT prophylaxis: Lovenox      Code Status: Full Code Family Communication:  None at the bedside Status is: Inpatient Remains inpatient appropriate because: Pending SNF placement    Subjective:  She feels better. She was having breakfast. She is WC and bed bound at baseline.   Examination:  General exam: Appears calm and comfortable  Respiratory system: Clear to auscultation. Respiratory effort normal. Cardiovascular system: S1 & S2 heard, RRR. No JVD, murmurs, rubs, gallops or clicks. No  pedal edema. Gastrointestinal system: Abdomen is nondistended, soft and nontender. No organomegaly or masses felt. Normal bowel sounds heard. Central nervous system: Alert and oriented. No focal neurological deficits. Extremities: Symmetric 5 x 5 power. Skin: No rashes, lesions or ulcers Psychiatry: Judgement and insight appear normal. Mood & affect appropriate.      Diet Orders (From admission, onward)     Start     Ordered   04/08/24 0000  Diet - low sodium heart healthy        04/08/24 1443   04/04/24 0221  Diet Heart Room service appropriate? Yes; Fluid consistency: Thin  Diet effective now       Question Answer Comment  Room service appropriate? Yes   Fluid consistency: Thin      04/04/24 0220            Objective: Vitals:   04/08/24 1258 04/08/24 2036 04/09/24 0448 04/09/24 0834  BP: 115/78 122/81 132/77 113/80  Pulse: 89 84 80   Resp: 16 20 18    Temp: 98.1 F (36.7 C) 98.8 F (37.1 C) 98.7 F (37.1 C)   TempSrc:      SpO2: 99% 96% 96%   Weight:      Height:        Intake/Output Summary (Last 24 hours) at 04/09/2024 1022 Last data filed at 04/08/2024 1837 Gross per 24 hour  Intake 240 ml  Output 900 ml  Net -660 ml   Filed Weights   04/03/24 1552  Weight: (!) 157.6 kg    Scheduled Meds:  amLODipine   5 mg Oral Daily   divalproex   250 mg Oral BID   enoxaparin  (LOVENOX ) injection  80 mg Subcutaneous Q24H   gabapentin   600 mg Oral BID   venlafaxine  XR  75 mg Oral Q breakfast   Continuous Infusions:  Nutritional status     Body mass index is 56.08 kg/m.  Data Reviewed:   CBC: Recent Labs  Lab 04/03/24 1705 04/04/24 0518 04/05/24 1604 04/07/24 1525  WBC 3.6* 4.0 4.2 3.4*  HGB 13.1 12.1 12.2 12.4  HCT 42.6 39.5 38.9 39.3  MCV 91.0 90.6 90.0 90.3  PLT 194 199 176 193   Basic Metabolic Panel: Recent Labs  Lab 04/03/24 1705 04/04/24 0518 04/05/24 1604 04/07/24 1525  NA 140 140 134* 137  K 3.4* 3.3* 3.9 3.8  CL 105 105 100 101   CO2 27 26 25 27   GLUCOSE 105* 113* 118* 137*  BUN 10 10 10 10   CREATININE 0.69 0.63 0.66 0.83  CALCIUM  9.1 8.9 8.6* 9.2   GFR: Estimated Creatinine Clearance: 116.4 mL/min (by C-G formula based on SCr of 0.83 mg/dL). Liver Function Tests: Recent Labs  Lab 04/03/24 1705 04/04/24 0518 04/05/24 1604 04/07/24 1525  AST 15 14* 17 15  ALT 12 10 12 12   ALKPHOS 82 77 79 76  BILITOT 1.0 1.2 0.7 0.8  PROT 7.5 7.0 6.9 7.2  ALBUMIN 3.6 3.3* 3.1* 3.1*   No results for input(s): LIPASE, AMYLASE in the last 168 hours. Recent Labs  Lab 04/03/24 1704  AMMONIA 25   Coagulation Profile: No results for input(s): INR, PROTIME in the last 168 hours. Cardiac Enzymes: No results for input(s): CKTOTAL, CKMB, CKMBINDEX, TROPONINI in the last 168 hours. BNP (last 3 results) No results for input(s): PROBNP in the last 8760 hours. HbA1C: No results for input(s): HGBA1C in the last 72 hours. CBG: Recent Labs  Lab 04/03/24 1630 04/07/24 1157  GLUCAP 95 142*   Lipid Profile: No results for input(s): CHOL, HDL, LDLCALC, TRIG, CHOLHDL, LDLDIRECT in the last 72 hours. Thyroid  Function Tests: No results for input(s): TSH, T4TOTAL, FREET4, T3FREE, THYROIDAB in the last 72 hours. Anemia Panel: No results for input(s): VITAMINB12, FOLATE, FERRITIN, TIBC, IRON, RETICCTPCT in the last 72 hours. Sepsis Labs: No results for input(s): PROCALCITON, LATICACIDVEN in the last 168 hours.  Recent Results (from the past 240 hours)  Urine Culture     Status: Abnormal   Collection Time: 04/03/24  7:38 PM   Specimen: Urine, Clean Catch  Result Value Ref Range Status   Specimen Description   Final    URINE, CLEAN CATCH Performed at [facility], [address] [place], [place] [address]    Special Requests   Final    NONE Performed at [facility], [address] [place], [place] [address]    Culture  >=100,000 COLONIES/mL ESCHERICHIA COLI (A)  Final   Report Status 04/06/2024 FINAL  Final   Organism ID, Bacteria ESCHERICHIA COLI (A)  Final      Susceptibility   Escherichia coli - MIC*    AMPICILLIN <=2 SENSITIVE Sensitive     CEFAZOLIN <=4 SENSITIVE Sensitive     CEFEPIME <=0.12 SENSITIVE Sensitive     CEFTRIAXONE  <=0.25 SENSITIVE Sensitive     CIPROFLOXACIN  >=4 RESISTANT Resistant     GENTAMICIN <=1 SENSITIVE Sensitive     IMIPENEM <=0.25 SENSITIVE Sensitive     NITROFURANTOIN <=16 SENSITIVE Sensitive     TRIMETH /SULFA  <=20 SENSITIVE Sensitive     AMPICILLIN/SULBACTAM <=2 SENSITIVE Sensitive     PIP/TAZO <=4 SENSITIVE Sensitive ug/mL    * >=100,000 COLONIES/mL ESCHERICHIA COLI  Radiology Studies: No results found.      LOS: 5 days   Time spent= 38 mins    Deliliah Room, MD [facility]  If 7PM-7AM, please contact night-coverage  04/09/2024, 10:22 AM

## 2024-04-09 NOTE — Consult Note (Signed)
 Consultation Note Date: 04/09/2024   Patient Name: Monica Mccoy  DOB: [birth year]  MRN: [id number]  Age / Sex: 58 y.o., female   PCP: Pcp, No Referring Physician: Dino Antu, MD  Reason for Consultation: Symptom management     Chief Complaint/History of Present Illness:   Patient is a 58 year old female with a past medical history of metastatic breast cancer with disease to brain in remission, chemotherapy-induced neuropathy, morbid obesity, hypertension, seizure disorder, depression, and HFpEF who was admitted on 04/03/2024 for increasing confusion over the past 1 to 2 weeks.  During hospitalization patient has received management for a urinary tract infection associated with acute metabolic encephalopathy.  Palliative medicine team consulted to assist with symptom management. Of note patient previously seen by palliative medicine team though last visits were in 2023.  Reviewed EMR prior to presenting to bedside including recent documentation from hospitalist, PT/OT, and TOC.  Patient had agreed to wanting rehab though no facility has accepted patient at this time. Patient currently receiving gabapentin  600 mg twice daily for her chemotherapy-induced neuropathy. Personally reviewed recent CT head which showed similar-appearing left frontal encephalomalacia and resection; appears this was compared to imaging from 01/23/2024.  MRI not recently obtained.  Presented to bedside to meet with patient.  No visitors present at bedside.  Patient laying comfortably in bed sleeping.  Patient was easily awakened.  Introduced myself as a member of the palliative medicine team and my role in patient's medical journey.  Patient minimally interacting noting that the pillow was causing her discomfort so provider assisted in adjusting this.  Tried to inquire about other symptoms though patient denied having any and appeared to be comfortable.  Patient was not able to engage in complex discussion with provider at  this time.  After visit discussed care with hospitalist, TOC, and RN.  TOC inquiring about hospice eligibility.  Discussed while patient does have metastatic breast cancer, has been reported to be in remission and MRI in not obtained recently to show any progression to brain.  Does not appear any further imaging obtained to rest of body to pursue workup of possible other metastatic disease to other locations.  Defer that workup to hospitalist/oncologist if recommended.  Could consider other diagnoses with multiple hospitalizations and limited mobility with assistance needed for ADLs.  Noted that to receive hospice in addition to requiring a terminal diagnosis with life expectancy of 6 months or less, patient and/or family would need to agree to hospice philosophy to focus on comfort at end-of-life.  Patient unable to engage in this conversation at this time.  Also patient had previously stated to other care team members that she wanted to engage in PT/OT and was willing to go to rehab to regain strength.  That goal for care moving forward does not align with hospice philosophy.  Also noted that acceptance into hospice would be dependent on the opinion of a hospice as medical director so should they want to pursue this, would recommend TOC offering choice to patient and family and then hospice medical director could make determination. Noted palliative medicine team would continue to follow along with patient's medical journey.  Primary Diagnoses  Present on Admission:  Complicated UTI (urinary tract infection)   Past Medical History:  Diagnosis Date   Anxiety    Breast cancer metastasized to brain [facility])    Left breast   Chronic pain after cancer treatment    Depression    GERD (gastroesophageal reflux disease)    History of cancer  chemotherapy    Breast cancer left breast   Seizure (HCC)    Social History   Socioeconomic History   Marital status: Divorced    Spouse name: Not on file    Number of children: 3   Years of education: Not on file   Highest education level: Not on file  Occupational History   Occupation: Disabled  Tobacco Use   Smoking status: Never   Smokeless tobacco: Never  Vaping Use   Vaping status: Never Used  Substance and Sexual Activity   Alcohol use: Yes    Comment: On occasion   Drug use: Yes    Types: Marijuana    Comment: Occasional use   Sexual activity: Not Currently  Other Topics Concern   Not on file  Social History Narrative   Right handed   Drinks caffeine   Two story home   Social Drivers of Health   Financial Resource Strain: Low Risk  (03/27/2022)   Overall Financial Resource Strain (CARDIA)    Difficulty of Paying Living Expenses: Not hard at all  Food Insecurity: Food Insecurity Present (04/03/2024)   Hunger Vital Sign    Worried About Running Out of Food in the Last Year: Sometimes true    Ran Out of Food in the Last Year: Sometimes true  Transportation Needs: Unmet Transportation Needs (04/03/2024)   PRAPARE - Transportation    Lack of Transportation (Medical): Yes    Lack of Transportation (Non-Medical): Yes  Physical Activity: Insufficiently Active (03/27/2022)   Exercise Vital Sign    Days of Exercise per Week: 1 day    Minutes of Exercise per Session: 30 min  Stress: No Stress Concern Present (03/27/2022)   [employer] of Occupational Health - Occupational Stress Questionnaire    Feeling of Stress : Not at all  Social Connections: Feeling Socially Integrated (01/11/2023)   Received from [facility]   OASIS D0700: Social Isolation    Frequency of experiencing loneliness or isolation: Never   Family History  Problem Relation Age of Onset   Obesity Mother    Hypertension Mother    Hyperlipidemia Mother    Diabetes Mother    Heart disease Mother    Scheduled Meds:  amLODipine   5 mg Oral Daily   divalproex   250 mg Oral BID   enoxaparin  (LOVENOX ) injection  80 mg Subcutaneous Q24H   gabapentin   600 mg Oral  BID   venlafaxine  XR  75 mg Oral Q breakfast   Continuous Infusions: PRN Meds:.acetaminophen  **OR** acetaminophen , ondansetron  **OR** ondansetron  (ZOFRAN ) IV Allergies  Allergen Reactions   Dilaudid [Hydromorphone Hcl] Other (See Comments)    Confusion & hallucinations   Morphine And Codeine Other (See Comments)    Headaches    CBC:    Component Value Date/Time   WBC 3.4 (L) 04/07/2024 1525   HGB 12.4 04/07/2024 1525   HGB 11.5 (L) 06/29/2022 0940   HCT 39.3 04/07/2024 1525   PLT 193 04/07/2024 1525   PLT 153 06/29/2022 0940   MCV 90.3 04/07/2024 1525   NEUTROABS 3.2 01/23/2024 1535   LYMPHSABS 0.9 01/23/2024 1535   MONOABS 0.8 01/23/2024 1535   EOSABS 0.1 01/23/2024 1535   BASOSABS 0.0 01/23/2024 1535   Comprehensive Metabolic Panel:    Component Value Date/Time   NA 137 04/07/2024 1525   NA 139 12/15/2019 0000   K 3.8 04/07/2024 1525   CL 101 04/07/2024 1525   CO2 27 04/07/2024 1525   BUN 10 04/07/2024 1525  BUN 10 12/15/2019 0000   CREATININE 0.83 04/07/2024 1525   CREATININE 0.92 06/29/2022 0940   GLUCOSE 137 (H) 04/07/2024 1525   CALCIUM  9.2 04/07/2024 1525   AST 15 04/07/2024 1525   AST 10 (L) 06/29/2022 0940   ALT 12 04/07/2024 1525   ALT 9 06/29/2022 0940   ALKPHOS 76 04/07/2024 1525   BILITOT 0.8 04/07/2024 1525   BILITOT 0.7 06/29/2022 0940   PROT 7.2 04/07/2024 1525   ALBUMIN 3.1 (L) 04/07/2024 1525    Physical Exam: Vital Signs: BP 113/80   Pulse 80   Temp 98.7 F (37.1 C)   Resp 18   Ht 5' 6 (1.676 m)   Wt (!) 157.6 kg   SpO2 96%   BMI 56.08 kg/m  SpO2: SpO2: 96 % O2 Device: O2 Device: Room Air O2 Flow Rate:   Intake/output summary:  Intake/Output Summary (Last 24 hours) at 04/09/2024 1119 Last data filed at 04/08/2024 1837 Gross per 24 hour  Intake 240 ml  Output 900 ml  Net -660 ml   LBM: Last BM Date : 04/05/24 Baseline Weight: Weight: (!) 157.6 kg Most recent weight: Weight: (!) 157.6 kg  General: NAD, awake, not able to  engage in complex medical decision-making at time of visit, chronically ill-appearing, obese Cardiovascular: RRR Respiratory: no increased work of breathing noted          Palliative Performance Scale: 50%              Additional Data Reviewed: Recent Labs    04/07/24 1525  WBC 3.4*  HGB 12.4  PLT 193  NA 137  BUN 10  CREATININE 0.83    Imaging: CT Head Wo Contrast CLINICAL DATA:  Mental status change, unknown cause Metastatic disease evaluation AMS x 2 weeks, increased urination with odor since last night, some shob, history of brain cancer. 152/90-80-98% RA-CBG 112   EXAM: CT HEAD WITHOUT CONTRAST  TECHNIQUE: Contiguous axial images were obtained from the base of the skull through the vertex without intravenous contrast.  RADIATION DOSE REDUCTION: This exam was performed according to the departmental dose-optimization program which includes automated exposure control, adjustment of the mA and/or kV according to patient size and/or use of iterative reconstruction technique.  COMPARISON:  CT head 01/23/2024  FINDINGS: Brain:  Cerebral ventricle sizes are concordant with the degree of cerebral volume loss. Patchy and confluent areas of decreased attenuation are noted throughout the deep and periventricular white matter of the cerebral hemispheres bilaterally, compatible with chronic microvascular ischemic disease. Similar-appearing left frontal encephalomalacia and resection. No evidence of large-territorial acute infarction. No parenchymal hemorrhage. No mass lesion. No extra-axial collection.  No mass effect or midline shift. No hydrocephalus. Basilar cisterns are patent.  Vascular: No hyperdense vessel.  Skull: No acute fracture or focal lesion. Prior left frontal craniotomy.  Sinuses/Orbits: Paranasal sinuses and mastoid air cells are clear. Left lens replacement. Otherwise the orbits are unremarkable.  Other: None.  IMPRESSION: No acute  intracranial abnormality.Similar-appearing left frontal encephalomalacia and resection. Please note CT is limited for evaluation of recurrent or metastatic masses, consider MRI with and without contrast for further evaluation.  Electronically Signed   By: Morgane  Naveau M.D.   On: 04/03/2024 17:22 DG Chest Portable 1 View EXAM: 1 VIEW XRAY OF THE CHEST 04/03/2024 04:29:00 PM  COMPARISON: 01/23/2023  CLINICAL HISTORY: AMS. AMS x 2 weeks, increased urination with odor since last night, some shob, history of brain cancer.  FINDINGS:  LUNGS AND PLEURA: Low lung  volumes with some crowding of bibasilar bronchovascular markings. No focal pulmonary opacity. No pulmonary edema. No pleural effusion. No pneumothorax.  HEART AND MEDIASTINUM: No acute abnormality of the cardiac and mediastinal silhouettes.  BONES AND SOFT TISSUES: Stable left IJ powerport to the proximal SVC. Surgical clips right axilla. No acute osseous abnormality.  IMPRESSION: 1. No acute findings. 2. Low lung volumes with some crowding of bibasilar bronchovascular markings.  Electronically signed by: Katheleen Faes MD 04/03/2024 04:32 PM EDT RP Workstation: [id number]    I personally reviewed recent imaging.   Palliative Care Assessment and Plan Summary of Established Goals of Care and Medical Treatment Preferences   Patient is a 58 year old female with a past medical history of metastatic breast cancer with disease to brain in remission, chemotherapy-induced neuropathy, morbid obesity, hypertension, seizure disorder, depression, and HFpEF who was admitted on 04/03/2024 for increasing confusion over the past 1 to 2 weeks.  During hospitalization patient has received management for a urinary tract infection associated with acute metabolic encephalopathy.  Palliative medicine team consulted to assist with symptom management. Of note patient previously seen by palliative medicine team though last visits were in  2023.  # Complex medical decision making/goals of care  - Attempted to discuss care with patient due to above HPI.  Patient not able to engage in complex medical decision making at that time.  Patient had already voiced to care team desire to participate with PT/OT and seek rehab to regain strength.  Discussed care with TOC as detailed above in HPI as well.  Palliative medicine team will continue to follow along to engage in conversations as able and appropriate moving forward.  -  Code Status: Full Code   # Symptom management Patient is receiving these palliative interventions for symptom management with an intent to improve quality of life.   - Chemotherapy induced neuropathy Patient appeared comfortable when seen today.   - Can continue with gabapentin  400 mg twice daily  # Psycho-social/Spiritual Support:  - Support System: Patient has ACP documentation on file naming sister Patrecia Molt as HCPOA and sister [name] Ill as primary alternate.  # Discharge Planning:  To Be Determined  Thank you for allowing the palliative care team to participate in the care [facility].  Tinnie Radar, DO Palliative Care Provider PMT # [phone]  If patient remains symptomatic despite maximum doses, please call PMT at [phone] between 0700 and 1900. Outside of these hours, please call attending, as PMT does not have night coverage.  Personally spent 60 minutes in patient care including extensive chart review (labs, imaging, progress/consult notes, vital signs), medically appropraite exam, discussed with treatment team, education to patient, family, and staff, documenting clinical information, medication review and management, coordination of care, and available advanced directive documents.

## 2024-04-10 DIAGNOSIS — N390 Urinary tract infection, site not specified: Secondary | ICD-10-CM | POA: Diagnosis not present

## 2024-04-10 NOTE — Plan of Care (Signed)

## 2024-04-10 NOTE — Progress Notes (Addendum)
 PROGRESS NOTE    Monica Mccoy  FMW:[id number] DOB: Feb 09, 1966 DOA: 04/03/2024 PCP: Pcp, No   Brief Narrative:   58 y.o. female with medical history significant of breast cancer with brain mets, apparently in remission, cognitive impairment, morbid obesity, hypertension, chronic diastolic CHF comes to the hospital with increased confusion over the last 1 to 2 weeks.  Apparently she has been having increased urination episodes, urine has been foul-smelling for few days. Mentation is back to baseline now. Awaiting SNF placement.  Assessment & Plan:  Principal Problem:   Complicated UTI (urinary tract infection) Active Problems:   Morbid obesity (HCC)   Malignant neoplasm of female breast (HCC)   Debility   Pain    Acute metabolic encephalopathy: resolved. No evidence of acute stroke likely related to urinary tract infection.  She is awake, alert and agreeable to go to rehab facility.   PT seen the patient recommending SNF. TOC working on it.   Essential hypertension-continue amlodipine  and Lasix .     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 Bed bound. May benefit from anti-obesity medications as she does have obesity related complications (Adiposopathy)  DVT prophylaxis: Lovenox      Code Status: Full Code Family Communication:  None at the bedside Status is: Inpatient Remains inpatient appropriate because: Pending SNF placement    Subjective:  No acute issues overnight. She was drowsy. She was seen by palliative care team yesterday.  Examination:  General exam: Appears calm and comfortable  Respiratory system: Clear to auscultation.  Respiratory effort normal. Cardiovascular system: S1 & S2 heard, RRR. No JVD, murmurs, rubs, gallops or clicks. No pedal edema. Gastrointestinal system: Abdomen is nondistended, soft and nontender. No organomegaly or masses felt. Normal bowel sounds heard. Central nervous system: Alert and oriented. No focal neurological deficits. Extremities: Symmetric 5 x 5 power. Skin: No rashes, lesions or ulcers Psychiatry: Judgement and insight appear normal. Mood & affect appropriate.      Diet Orders (From admission, onward)     Start     Ordered   04/08/24 0000  Diet - low sodium heart healthy        04/08/24 1443   04/04/24 0221  Diet Heart Room service appropriate? Yes; Fluid consistency: Thin  Diet effective now       Question Answer Comment  Room service appropriate? Yes   Fluid consistency: Thin      04/04/24 0220            Objective: Vitals:   04/09/24 1235 04/09/24 1943 04/09/24 2021 04/10/24 0508  BP: 116/83 117/89 (!) 141/75 121/85  Pulse: 88 81 80 74  Resp: 18 16 20 18   Temp: 98.8 F (37.1 C) 98 F (36.7 C) 97.6 F (36.4 C) 98.4 F (36.9 C)  TempSrc: Oral Oral Oral Oral  SpO2: 91% 95% 96% 98%  Weight:      Height:        Intake/Output Summary (Last 24 hours) at 04/10/2024 0909 Last data filed at 04/09/2024 2123 Gross per 24 hour  Intake 930 ml  Output 725 ml  Net 205 ml   Filed Weights   04/03/24 1552  Weight: (!) 157.6 kg    Scheduled Meds:  amLODipine   5  mg Oral Daily   divalproex   250 mg Oral BID   enoxaparin  (LOVENOX ) injection  80 mg Subcutaneous Q24H   gabapentin   600 mg Oral BID   venlafaxine  XR  75 mg Oral Q breakfast   Continuous Infusions:  Nutritional status     Body mass index is 56.08 kg/m.  Data Reviewed:   CBC: Recent Labs  Lab 04/03/24 1705 04/04/24 0518 04/05/24 1604 04/07/24 1525  WBC 3.6* 4.0 4.2 3.4*  HGB 13.1 12.1 12.2 12.4  HCT 42.6 39.5 38.9 39.3  MCV 91.0 90.6 90.0 90.3  PLT 194 199 176 193   Basic  Metabolic Panel: Recent Labs  Lab 04/03/24 1705 04/04/24 0518 04/05/24 1604 04/07/24 1525  NA 140 140 134* 137  K 3.4* 3.3* 3.9 3.8  CL 105 105 100 101  CO2 27 26 25 27   GLUCOSE 105* 113* 118* 137*  BUN 10 10 10 10   CREATININE 0.69 0.63 0.66 0.83  CALCIUM  9.1 8.9 8.6* 9.2   GFR: Estimated Creatinine Clearance: 116.4 mL/min (by C-G formula based on SCr of 0.83 mg/dL). Liver Function Tests: Recent Labs  Lab 04/03/24 1705 04/04/24 0518 04/05/24 1604 04/07/24 1525  AST 15 14* 17 15  ALT 12 10 12 12   ALKPHOS 82 77 79 76  BILITOT 1.0 1.2 0.7 0.8  PROT 7.5 7.0 6.9 7.2  ALBUMIN 3.6 3.3* 3.1* 3.1*   No results for input(s): LIPASE, AMYLASE in the last 168 hours. Recent Labs  Lab 04/03/24 1704  AMMONIA 25   Coagulation Profile: No results for input(s): INR, PROTIME in the last 168 hours. Cardiac Enzymes: No results for input(s): CKTOTAL, CKMB, CKMBINDEX, TROPONINI in the last 168 hours. BNP (last 3 results) No results for input(s): PROBNP in the last 8760 hours. HbA1C: No results for input(s): HGBA1C in the last 72 hours. CBG: Recent Labs  Lab 04/03/24 1630 04/07/24 1157  GLUCAP 95 142*   Lipid Profile: No results for input(s): CHOL, HDL, LDLCALC, TRIG, CHOLHDL, LDLDIRECT in the last 72 hours. Thyroid  Function Tests: No results for input(s): TSH, T4TOTAL, FREET4, T3FREE, THYROIDAB in the last 72 hours. Anemia Panel: No results for input(s): VITAMINB12, FOLATE, FERRITIN, TIBC, IRON, RETICCTPCT in the last 72 hours. Sepsis Labs: No results for input(s): PROCALCITON, LATICACIDVEN in the last 168 hours.  Recent Results (from the past 240 hours)  Urine Culture     Status: Abnormal   Collection Time: 04/03/24  7:38 PM   Specimen: Urine, Clean Catch  Result Value Ref Range Status   Specimen Description   Final    URINE, CLEAN CATCH Performed at [facility], [address]  [place], [place] [address]    Special Requests   Final    NONE Performed at [facility], [address], [place], [place] [address]    Culture >=100,000 COLONIES/mL ESCHERICHIA COLI (A)  Final   Report Status 04/06/2024 FINAL  Final   Organism ID, Bacteria ESCHERICHIA COLI (A)  Final      Susceptibility   Escherichia coli - MIC*    AMPICILLIN <=2 SENSITIVE Sensitive     CEFAZOLIN <=4 SENSITIVE Sensitive     CEFEPIME <=0.12 SENSITIVE Sensitive     CEFTRIAXONE  <=0.25 SENSITIVE Sensitive     CIPROFLOXACIN  >=4 RESISTANT Resistant     GENTAMICIN <=1 SENSITIVE Sensitive     IMIPENEM <=0.25 SENSITIVE Sensitive     NITROFURANTOIN <=16 SENSITIVE Sensitive     TRIMETH /SULFA  <=20 SENSITIVE Sensitive     AMPICILLIN/SULBACTAM <=  2 SENSITIVE Sensitive     PIP/TAZO <=4 SENSITIVE Sensitive ug/mL    * >=100,000 COLONIES/mL ESCHERICHIA COLI         Radiology Studies: No results found.      LOS: 6 days   Time spent= 38 mins    Deliliah Room, MD [facility]  If 7PM-7AM, please contact night-coverage  04/10/2024, 9:09 AM

## 2024-04-10 NOTE — Progress Notes (Signed)
 OT Cancellation Note  Patient Details Name: Aylla Huffine MRN: [id number] DOB: 03/11/1966   Cancelled Treatment:    Reason Eval/Treat Not Completed: Patient declined. She was eating and asked for therapy to check back at another time.   Delanna JINNY Lesches, OTR/L 04/10/2024, 4:48 PM

## 2024-04-10 NOTE — TOC Progression Note (Signed)
 Transition of Care [facility]) - Progression Note    Patient Details  Name: Monica Mccoy MRN: [id number] Date of Birth: Dec 31, 1965  [facility] CM/SW Contact  Alfonse JONELLE Rex, RN Phone Number: 04/10/2024, 3:14 PM  Clinical Narrative:   Call to patient's sister, [name] Devonshire, per her request, introduced self. [name] confirmed patient's Medicare Plan has been discontinued and that she applied for Medicaid on patient's behalf on yesterday. Reviewed with [name] that Medicaid Status pending status will show up when it has been confirmed by hospital Financial Navigator, [name] voiced understanding. TOC will continue to follow.     Expected Discharge Plan: Skilled Nursing Facility Barriers to Discharge: SNF Pending bed offer, Inadequate or no insurance               Expected Discharge Plan and Services In-house Referral: NA Discharge Planning Services: CM Consult Post Acute Care Choice: NA Living arrangements for the past 2 months: Apartment Expected Discharge Date: 04/08/24               DME Arranged: N/A DME Agency: NA       HH Arranged: NA HH Agency: NA         Social Drivers of Health (SDOH) Interventions SDOH Screenings   Food Insecurity: Food Insecurity Present (04/03/2024)  Housing: Unknown (04/03/2024)  Transportation Needs: Unmet Transportation Needs (04/03/2024)  Utilities: At Risk (04/03/2024)  Alcohol Screen: Low Risk  (03/27/2022)  Depression (PHQ2-9): High Risk (05/21/2022)  Financial Resource Strain: Low Risk  (03/27/2022)  Physical Activity: Insufficiently Active (03/27/2022)  Social Connections: Feeling Socially Integrated (01/11/2023)   Received from [facility]  Stress: No Stress Concern Present (03/27/2022)  Tobacco Use: Low Risk  (04/03/2024)    Readmission Risk Interventions    08/19/2023   11:05 AM 06/11/2022   11:48 AM 06/10/2022   10:14 AM  Readmission Risk Prevention Plan  Post Dischage Appt Complete    Medication Screening Complete     Transportation Screening Complete Complete Complete  PCP or Specialist Appt within 5-7 Days  Complete Complete  Home Care Screening  Complete Complete  Medication Review (RN CM)  Complete Complete

## 2024-04-10 NOTE — Plan of Care (Signed)
  Problem: Education: Goal: Knowledge of General Education information will improve Description: Including pain rating scale, medication(s)/side effects and non-pharmacologic comfort measures Outcome: Progressing   Problem: Clinical Measurements: Goal: Ability to maintain clinical measurements within normal limits will improve Outcome: Progressing   Problem: Coping: Goal: Level of anxiety will decrease Outcome: Progressing   

## 2024-04-10 NOTE — TOC Progression Note (Signed)
 Transition of Care [facility]) - Progression Note    Patient Details  Name: Monica Mccoy MRN: [id number] Date of Birth: 1965-10-07  Transition of Care [facility]) CM/SW Contact  Alfonse JONELLE Rex, RN Phone Number: 04/10/2024, 1:09 PM  Clinical Narrative:  Short term rehab bed offer initially sent to [facility], note placed from facility inquiring about insurance with request to call [facility] to discuss [phone] to discuss patient's insurance on file. Call to [facility] admit coord w/[facility], no answer, vm left with NCM name and phone number requesting call back.   TOC will continue to follow.     Expected Discharge Plan: Skilled Nursing Facility Barriers to Discharge: SNF Pending bed offer, Inadequate or no insurance               Expected Discharge Plan and Services In-house Referral: NA Discharge Planning Services: CM Consult Post Acute Care Choice: NA Living arrangements for the past 2 months: Apartment Expected Discharge Date: 04/08/24               DME Arranged: N/A DME Agency: NA       HH Arranged: NA HH Agency: NA         Social Drivers of Health (SDOH) Interventions SDOH Screenings   Food Insecurity: Food Insecurity Present (04/03/2024)  Housing: Unknown (04/03/2024)  Transportation Needs: Unmet Transportation Needs (04/03/2024)  Utilities: At Risk (04/03/2024)  Alcohol Screen: Low Risk  (03/27/2022)  Depression (PHQ2-9): High Risk (05/21/2022)  Financial Resource Strain: Low Risk  (03/27/2022)  Physical Activity: Insufficiently Active (03/27/2022)  Social Connections: Feeling Socially Integrated (01/11/2023)   Received from [facility]  Stress: No Stress Concern Present (03/27/2022)  Tobacco Use: Low Risk  (04/03/2024)    Readmission Risk Interventions    08/19/2023   11:05 AM 06/11/2022   11:48 AM 06/10/2022   10:14 AM  Readmission Risk Prevention Plan  Post Dischage Appt Complete    Medication Screening Complete    Transportation Screening Complete Complete  Complete  PCP or Specialist Appt within 5-7 Days  Complete Complete  Home Care Screening  Complete Complete  Medication Review (RN CM)  Complete Complete

## 2024-04-11 DIAGNOSIS — N390 Urinary tract infection, site not specified: Secondary | ICD-10-CM | POA: Diagnosis not present

## 2024-04-11 NOTE — Progress Notes (Addendum)
 Physical Therapy Treatment Patient Details Name: Monica Mccoy MRN: [id number] DOB: [birth year] Today's Date: 04/11/2024   History of Present Illness Pt is 58 yo female admitted on 04/03/24 with complicated UTI and acute metabolic encephalopathy. Pt had CT head with no acute findings.  Pt does have breast CA with brain mets with hx of brain surgery and masectomy that is apparently in remission.  Other hx including but not limited to cognitive impairment, morbid obesity, HTN, CHF, seizure, GERD, and anxiety.    PT Comments  Pt received supine in bed very lethargic and difficult to arouse, with verbal and tactile stimuli able to arouse and open eyes. Pt required modA+2 for supine to sit, able to iniate movement of BLE off bed and trunk elevation but required assist to complete transfer with chuck pad; pt requiring mod-maxA to prevent retropulsive posterior LOB and required CGA up to maxA throughout sitting EOB; BP sitting 120/99. Pt guided through STS to RW from elevated bed x3 requiring maxA+2, weight maintained posterior non responisve to cuing for trunk/hip flexion and weight shifts; attempted SPT with PT assistance at LLE, unable to advance, eventually collapsing to EOB, returned to supine positionining dependently and then adjusted supine positioning maxA+2 pt assisting with extremities as able; modA+2 for rolling to ensure linens are flat. Discharge desintation remains appropriate. We will continue to follow acutely.    If plan is discharge home, recommend the following: Two people to help with bathing/dressing/bathroom;Two people to help with walking and/or transfers;Assistance with cooking/housework;Assist for transportation;Help with stairs or ramp for entrance;Supervision due to cognitive status;Assistance with feeding   Can travel by private vehicle     No  Equipment Recommendations  Wheelchair cushion (measurements PT);Wheelchair (measurements PT);Rolling walker (2 wheels);BSC/3in1;Hospital  bed    Recommendations for Other Services       Precautions / Restrictions Precautions Precautions: Fall Restrictions Weight Bearing Restrictions Per Provider Order: No     Mobility  Bed Mobility Overal bed mobility: Needs Assistance Bed Mobility: Supine to Sit, Sit to Supine, Rolling Rolling: Used rails, Mod assist, +2 for physical assistance, +2 for safety/equipment   Supine to sit: HOB elevated, Used rails, Max assist, +2 for physical assistance, +2 for safety/equipment Sit to supine: Max assist, +2 for physical assistance, +2 for safety/equipment   General bed mobility comments: pt able to initiate B LE to EOB and trunk flexion with use of hospital bed pt required max A x 2 with bed pad to complete scoot to EOB and mod-maxA for sitting balance as pt with retropulsion and posterior lean, CGA at times; pt required max A x 2 for sit to supine with pt able to initiate R LE to return to bed and once in supine pt is able to engage to reposition and roll side to side with bed rails and modA+2    Transfers Overall transfer level: Needs assistance Equipment used: Rolling walker (2 wheels) Transfers: Sit to/from Stand Sit to Stand: From elevated surface, +2 physical assistance, +2 safety/equipment, Max assist           General transfer comment: pt required max A x 2 for sit to stand from elevated EOB with pulling up on RW, multimodal cues for extension posture and to complete power up, and power up from EOB PT initiated LLE advacement for SPT to recliner during the course of transfer and able to take one step with maxA at LLE, but unable to progress to true SPT, pt sitting and returned to supine.  Ambulation/Gait               General Gait Details: Unsafe   Stairs             Wheelchair Mobility     Tilt Bed    Modified Rankin (Stroke Patients Only)       Balance Overall balance assessment: Needs assistance Sitting-balance support: Feet supported, No  upper extremity supported Sitting balance-Leahy Scale: Poor Sitting balance - Comments: constantly leaning posterior and R lateral, difficulty attending to midline and facilitation for trunk flexion with mod-max   Standing balance support: Bilateral upper extremity supported, During functional activity, Reliant on assistive device for balance Standing balance-Leahy Scale: Zero                              Communication Communication Communication: Impaired Factors Affecting Communication: Difficulty expressing self;Reduced clarity of speech  Cognition Arousal: Lethargic Behavior During Therapy: Flat affect   PT - Cognitive impairments: History of cognitive impairments, No family/caregiver present to determine baseline, Orientation, Problem solving, Safety/Judgement, Awareness   Orientation impairments: Place, Time, Situation                   PT - Cognition Comments: Pt only oriented to self.  She often has non-sensical speech or word salad.  Pt very pleasant and cooperative - but only following 1 step commands and needs cues for sequencing/initiation. Stated I love you as PT exited session. Following commands: Impaired Following commands impaired: Follows one step commands inconsistently    Cueing Cueing Techniques: Verbal cues, Gestural cues, Visual cues, Tactile cues  Exercises      General Comments        Pertinent Vitals/Pain Pain Assessment Pain Assessment: No/denies pain    Home Living Family/patient expects to be discharged to:: Private residence Living Arrangements: Other (Comment) (sister) Available Help at Discharge: Family               Additional Comments: Pt not able to provide history.  Nurse confirmed pt lived with sister but sister reports she can not handle her like she is now.    Prior Function            PT Goals (current goals can now be found in the care plan section) Acute Rehab PT Goals PT Goal Formulation: With  patient Time For Goal Achievement: 04/19/24 Potential to Achieve Goals: Good Progress towards PT goals: Not progressing toward goals - comment    Frequency    Min 3X/week      PT Plan      Co-evaluation              AM-PAC PT 6 Clicks Mobility   Outcome Measure  Help needed turning from your back to your side while in a flat bed without using bedrails?: A Lot Help needed moving from lying on your back to sitting on the side of a flat bed without using bedrails?: Total Help needed moving to and from a bed to a chair (including a wheelchair)?: Total Help needed standing up from a chair using your arms (e.g., wheelchair or bedside chair)?: Total Help needed to walk in hospital room?: Total Help needed climbing 3-5 steps with a railing? : Total 6 Click Score: 7    End of Session Equipment Utilized During Treatment: Gait belt Activity Tolerance: Patient limited by fatigue Patient left: in bed;with call bell/phone within reach;with bed alarm set Nurse Communication:  Mobility status;Need for lift equipment PT Visit Diagnosis: Other abnormalities of gait and mobility (R26.89);Muscle weakness (generalized) (M62.81)     Time: 8643-8573 PT Time Calculation (min) (ACUTE ONLY): 30 min  Charges:    $Therapeutic Activity: 23-37 mins PT General Charges $$ ACUTE PT VISIT: 1 Visit                     Elsie Grieves, PT, DPT [facility] Rehabilitation Department Office: [phone]   Elsie Grieves 04/11/2024, 2:30 PM

## 2024-04-11 NOTE — Progress Notes (Signed)
 PROGRESS NOTE    Monica Mccoy  FMW:[id number] DOB: [birth year] DOA: 04/03/2024 PCP: Pcp, No   Brief Narrative:   58 y.o. female with medical history significant of breast cancer with brain mets, apparently in remission, cognitive impairment, morbid obesity, hypertension, chronic diastolic CHF comes to the hospital with increased confusion over the last 1 to 2 weeks.  Apparently she has been having increased urination episodes, urine has been foul-smelling for few days. Mentation is back to baseline now. Awaiting SNF placement.  Assessment & Plan:  Principal Problem:   Complicated UTI (urinary tract infection) Active Problems:   Morbid obesity (HCC)   Malignant neoplasm of female breast (HCC)   Debility   Pain    Acute metabolic encephalopathy: resolved. No evidence of acute stroke likely related to urinary tract infection.  She is awake, alert and agreeable to go to rehab facility.   PT seen the patient recommending SNF. TOC working on it.   Essential hypertension-continue amlodipine  and Lasix .     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 Bed bound. May benefit from anti-obesity medications as she does have obesity related complications (Adiposopathy)  DVT prophylaxis: Lovenox      Code Status: Full Code Family Communication:  None at the bedside Status is: Inpatient Remains inpatient appropriate because: Pending SNF placement    Subjective:  No acute issues overnight. Denies chest pain or shortness of breath.  Examination:  General exam: Appears calm and comfortable  Respiratory system: Clear to auscultation. Respiratory effort  normal. Cardiovascular system: S1 & S2 heard, RRR. No JVD, murmurs, rubs, gallops or clicks. No pedal edema. Gastrointestinal system: Abdomen is nondistended, soft and nontender. No organomegaly or masses felt. Normal bowel sounds heard. Central nervous system: Alert and oriented. No focal neurological deficits. Extremities: Symmetric 5 x 5 power. Skin: No rashes, lesions or ulcers Psychiatry: Judgement and insight appear normal. Mood & affect appropriate.      Diet Orders (From admission, onward)     Start     Ordered   04/08/24 0000  Diet - low sodium heart healthy        04/08/24 1443   04/04/24 0221  Diet Heart Room service appropriate? Yes; Fluid consistency: Thin  Diet effective now       Question Answer Comment  Room service appropriate? Yes   Fluid consistency: Thin      04/04/24 0220            Objective: Vitals:   04/10/24 0508 04/10/24 0915 04/10/24 1943 04/11/24 0500  BP: 121/85 (!) 131/90 (!) 145/87 127/83  Pulse: 74  82 78  Resp: 18  18 18   Temp: 98.4 F (36.9 C)  (!) 97.3 F (36.3 C) 97.6 F (36.4 C)  TempSrc: Oral  Oral Oral  SpO2: 98%  96% 99%  Weight:      Height:        Intake/Output Summary (Last 24 hours) at 04/11/2024 1005 Last data filed at 04/10/2024 2140 Gross per 24 hour  Intake 240 ml  Output --  Net 240 ml   Filed Weights   04/03/24 1552  Weight: (!) 157.6 kg    Scheduled Meds:  amLODipine   5 mg Oral Daily   divalproex   250 mg Oral BID   enoxaparin  (LOVENOX ) injection  80 mg Subcutaneous Q24H   gabapentin   600 mg Oral BID   venlafaxine  XR  75 mg Oral Q breakfast   Continuous Infusions:  Nutritional status     Body mass index is 56.08 kg/m.  Data Reviewed:   CBC: Recent Labs  Lab 04/05/24 1604 04/07/24 1525  WBC 4.2 3.4*  HGB 12.2 12.4  HCT 38.9 39.3  MCV 90.0 90.3  PLT 176 193   Basic Metabolic Panel: Recent Labs  Lab 04/05/24 1604 04/07/24 1525  NA 134* 137  K 3.9 3.8  CL 100 101  CO2 25 27   GLUCOSE 118* 137*  BUN 10 10  CREATININE 0.66 0.83  CALCIUM  8.6* 9.2   GFR: Estimated Creatinine Clearance: 116.4 mL/min (by C-G formula based on SCr of 0.83 mg/dL). Liver Function Tests: Recent Labs  Lab 04/05/24 1604 04/07/24 1525  AST 17 15  ALT 12 12  ALKPHOS 79 76  BILITOT 0.7 0.8  PROT 6.9 7.2  ALBUMIN 3.1* 3.1*   No results for input(s): LIPASE, AMYLASE in the last 168 hours. No results for input(s): AMMONIA in the last 168 hours.  Coagulation Profile: No results for input(s): INR, PROTIME in the last 168 hours. Cardiac Enzymes: No results for input(s): CKTOTAL, CKMB, CKMBINDEX, TROPONINI in the last 168 hours. BNP (last 3 results) No results for input(s): PROBNP in the last 8760 hours. HbA1C: No results for input(s): HGBA1C in the last 72 hours. CBG: Recent Labs  Lab 04/07/24 1157  GLUCAP 142*   Lipid Profile: No results for input(s): CHOL, HDL, LDLCALC, TRIG, CHOLHDL, LDLDIRECT in the last 72 hours. Thyroid  Function Tests: No results for input(s): TSH, T4TOTAL, FREET4, T3FREE, THYROIDAB in the last 72 hours. Anemia Panel: No results for input(s): VITAMINB12, FOLATE, FERRITIN, TIBC, IRON, RETICCTPCT in the last 72 hours. Sepsis Labs: No results for input(s): PROCALCITON, LATICACIDVEN in the last 168 hours.  Recent Results (from the past 240 hours)  Urine Culture     Status: Abnormal   Collection Time: 04/03/24  7:38 PM   Specimen: Urine, Clean Catch  Result Value Ref Range Status   Specimen Description   Final    URINE, CLEAN CATCH Performed at [facility], [address], [place], [place] [address]    Special Requests   Final    NONE Performed at [facility], [address] [place], [place] [address]    Culture >=100,000 COLONIES/mL ESCHERICHIA COLI (A)  Final   Report Status 04/06/2024 FINAL  Final   Organism ID, Bacteria ESCHERICHIA COLI (A)   Final      Susceptibility   Escherichia coli - MIC*    AMPICILLIN <=2 SENSITIVE Sensitive     CEFAZOLIN <=4 SENSITIVE Sensitive     CEFEPIME <=0.12 SENSITIVE Sensitive     CEFTRIAXONE  <=0.25 SENSITIVE Sensitive     CIPROFLOXACIN  >=4 RESISTANT Resistant     GENTAMICIN <=1 SENSITIVE Sensitive     IMIPENEM <=0.25 SENSITIVE Sensitive     NITROFURANTOIN <=16 SENSITIVE Sensitive     TRIMETH /SULFA  <=20 SENSITIVE Sensitive     AMPICILLIN/SULBACTAM <=2 SENSITIVE Sensitive     PIP/TAZO <=4 SENSITIVE Sensitive ug/mL    * >=100,000 COLONIES/mL ESCHERICHIA COLI         Radiology Studies: No results found.      LOS: 7 days   Time spent= 38 mins    Deliliah Room, MD [facility]  If 7PM-7AM, please contact  night-coverage  04/11/2024, 10:05 AM

## 2024-04-11 NOTE — Plan of Care (Signed)
  Problem: Education: Goal: Knowledge of General Education information will improve Description: Including pain rating scale, medication(s)/side effects and non-pharmacologic comfort measures Outcome: Progressing   Problem: Clinical Measurements: Goal: Ability to maintain clinical measurements within normal limits will improve Outcome: Progressing   Problem: Coping: Goal: Level of anxiety will decrease Outcome: Progressing   

## 2024-04-12 DIAGNOSIS — N390 Urinary tract infection, site not specified: Secondary | ICD-10-CM | POA: Diagnosis not present

## 2024-04-12 NOTE — Plan of Care (Signed)
  Problem: Clinical Measurements: Goal: Ability to maintain clinical measurements within normal limits will improve Outcome: Progressing Goal: Will remain free from infection Outcome: Progressing Goal: Diagnostic test results will improve Outcome: Progressing Goal: Respiratory complications will improve Outcome: Progressing Goal: Cardiovascular complication will be avoided Outcome: Progressing   Problem: Nutrition: Goal: Adequate nutrition will be maintained Outcome: Progressing   Problem: Coping: Goal: Level of anxiety will decrease Outcome: Progressing   Problem: Elimination: Goal: Will not experience complications related to bowel motility Outcome: Progressing Goal: Will not experience complications related to urinary retention Outcome: Progressing   Problem: Pain Managment: Goal: General experience of comfort will improve and/or be controlled Outcome: Progressing   Problem: Safety: Goal: Ability to remain free from injury will improve Outcome: Progressing

## 2024-04-12 NOTE — Progress Notes (Signed)
 Occupational Therapy Treatment Patient Details Name: Monica Mccoy MRN: [id number] DOB: July 18, 1966 Today's Date: 04/12/2024   History of present illness Pt is 58 yo female admitted on 04/03/24 with complicated UTI and acute metabolic encephalopathy. Pt had CT head with no acute findings.  Pt does have breast CA with brain mets with hx of brain surgery and masectomy that is apparently in remission.  Other hx including but not limited to cognitive impairment, morbid obesity, HTN, CHF, seizure, GERD, and anxiety.   OT comments  Patient seen by OT in order to promote increased activity tolerance and ADL independence. Patient sleeping upon arrival, and hesitant to participate stating, I dont feel so good. Patient agreeable to ADLs with grooming tasks completed and gown changed. Patient noted to need help with teeth brushing and applying deoderant due to significant R shoulder pain. OT positioning on pillow with relief noted. OT informing RN at end of session. OT recommendation remains appropriate, will continue to follow.       If plan is discharge home, recommend the following:  A lot of help with walking and/or transfers;A lot of help with bathing/dressing/bathroom;Two people to help with walking and/or transfers;Supervision due to cognitive status;Assistance with cooking/housework;Assistance with feeding;Direct supervision/assist for medications management;Direct supervision/assist for financial management;Help with stairs or ramp for entrance;Assist for transportation   Equipment Recommendations  Other (comment) (Defer to next venue)    Recommendations for Other Services      Precautions / Restrictions Precautions Precautions: Fall Recall of Precautions/Restrictions: Impaired Restrictions Weight Bearing Restrictions Per Provider Order: No       Mobility Bed Mobility                    Transfers                   General transfer comment: deferred since OT only  present     Balance                                           ADL either performed or assessed with clinical judgement   ADL Overall ADL's : Needs assistance/impaired     Grooming: Wash/dry hands;Wash/dry face;Applying deodorant;Minimal assistance;Oral care Grooming Details (indicate cue type and reason): Min A for deoderant and teeth brushing, noted increased work of effort to complete         Upper Body Dressing : Minimal assistance;Cueing for sequencing;Bed level Upper Body Dressing Details (indicate cue type and reason): Gown change in supine.                 Functional mobility during ADLs: Minimal assistance General ADL Comments: Patient seen by OT in order to promote increased activity tolerance and ADL independence. Patient sleeping upon arrival, and hesitant to participate stating, I dont feel so good. Patient agreeable to ADLs with grooming tasks completed and gown changed. Patient noted to need help with teeth brushing and applying deoderant due to significant R shoulder pain. OT positioning on pillow with relief noted. OT informing RN at end of session. OT recommendation remains appropriate, will continue to follow.    Extremity/Trunk Assessment Upper Extremity Assessment Upper Extremity Assessment: RUE deficits/detail RUE Deficits / Details: significant pain in R shoulder, propped on pillow, unable to formally test due to pain            Vision  Perception     Praxis     Communication Communication Communication: Impaired Factors Affecting Communication: Difficulty expressing self;Reduced clarity of speech   Cognition Arousal: Lethargic Behavior During Therapy: Flat affect Cognition: History of cognitive impairments             OT - Cognition Comments: Pleasant once awake, requiring increased time for all tasks                 Following commands: Impaired Following commands impaired: Only follows one step  commands consistently, Follows one step commands with increased time      Cueing   Cueing Techniques: Verbal cues, Gestural cues, Visual cues, Tactile cues  Exercises      Shoulder Instructions       General Comments VSS    Pertinent Vitals/ Pain       Pain Assessment Pain Assessment: Faces Faces Pain Scale: Hurts even more Pain Location: R arm Pain Descriptors / Indicators: Grimacing, Guarding, Discomfort Pain Intervention(s): Limited activity within patient's tolerance, Monitored during session, Repositioned  Home Living                                          Prior Functioning/Environment              Frequency  Min 2X/week        Progress Toward Goals  OT Goals(current goals can now be found in the care plan section)     Acute Rehab OT Goals Patient Stated Goal: unable OT Goal Formulation: Patient unable to participate in goal setting Time For Goal Achievement: 04/19/24 Potential to Achieve Goals: Fair  Plan      Co-evaluation                 AM-PAC OT 6 Clicks Daily Activity     Outcome Measure   Help from another person eating meals?: A Little Help from another person taking care of personal grooming?: A Little Help from another person toileting, which includes using toliet, bedpan, or urinal?: A Lot Help from another person bathing (including washing, rinsing, drying)?: A Lot Help from another person to put on and taking off regular upper body clothing?: A Little Help from another person to put on and taking off regular lower body clothing?: A Lot 6 Click Score: 15    End of Session    OT Visit Diagnosis: Cognitive communication deficit (R41.841);Unsteadiness on feet (R26.81);Muscle weakness (generalized) (M62.81);Other symptoms and signs involving cognitive function;Pain Pain - Right/Left: Right Pain - part of body: Shoulder   Activity Tolerance Patient limited by pain   Patient Left in bed;with call  bell/phone within reach;with bed alarm set   Nurse Communication Mobility status;Other (comment) (Pain in R shoulder)        Time: 8658-8592 OT Time Calculation (min): 26 min  Charges: OT General Charges $OT Visit: 1 Visit OT Treatments $Self Care/Home Management : 23-37 mins  Ronal Gift E. Lilyanne Mcqu[place], OTR/L Acute Rehabilitation Services [phone]   Ronal Gift Salt 04/12/2024, 2:20 PM

## 2024-04-12 NOTE — Progress Notes (Signed)
 PROGRESS NOTE    Monica Mccoy  FMW:[id number] DOB: [birth year] DOA: 04/03/2024 PCP: Pcp, No   Brief Narrative:   58 y.o. female with medical history significant of breast cancer with brain mets, apparently in remission, cognitive impairment, morbid obesity, hypertension, chronic diastolic CHF comes to the hospital with increased confusion over the last 1 to 2 weeks.  Apparently she has been having increased urination episodes, urine has been foul-smelling for few days. Mentation is back to baseline now. Awaiting SNF placement.  Assessment & Plan:  Principal Problem:   Complicated UTI (urinary tract infection) Active Problems:   Morbid obesity (HCC)   Malignant neoplasm of female breast (HCC)   Debility   Pain    Acute metabolic encephalopathy: resolved. No evidence of acute stroke likely related to urinary tract infection.  She is awake, alert and agreeable to go to rehab facility.   PT seen the patient recommending SNF. TOC working on it.   Essential hypertension-continue amlodipine  and Lasix .     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 Bed bound. May benefit from anti-obesity medications as she does have obesity related complications (Adiposopathy)  DVT prophylaxis: Lovenox      Code Status: Full Code Family Communication:  None at the bedside Status is: Inpatient Remains inpatient appropriate because: Pending SNF placement    Subjective:  She was having breakfast this morning. No acute issues overnight.   Examination:  General exam: Appears calm and comfortable  Respiratory system: Clear to auscultation. Respiratory effort  normal. Cardiovascular system: S1 & S2 heard, RRR. No JVD, murmurs, rubs, gallops or clicks. No pedal edema. Gastrointestinal system: Abdomen is nondistended, soft and nontender. No organomegaly or masses felt. Normal bowel sounds heard. Central nervous system: Alert and oriented. No focal neurological deficits. Extremities: Symmetric 5 x 5 power. Skin: No rashes, lesions or ulcers Psychiatry: Judgement and insight appear normal. Mood & affect appropriate.      Diet Orders (From admission, onward)     Start     Ordered   04/08/24 0000  Diet - low sodium heart healthy        04/08/24 1443   04/04/24 0221  Diet Heart Room service appropriate? Yes; Fluid consistency: Thin  Diet effective now       Question Answer Comment  Room service appropriate? Yes   Fluid consistency: Thin      04/04/24 0220            Objective: Vitals:   04/11/24 0500 04/11/24 1231 04/11/24 1938 04/12/24 0505  BP: 127/83 (!) 121/91 123/87 127/86  Pulse: 78 74 84 71  Resp: 18 18 18 19   Temp: 97.6 F (36.4 C) 98.4 F (36.9 C) 98.1 F (36.7 C) 98.6 F (37 C)  TempSrc: Oral   Oral  SpO2: 99% 97% 94% 93%  Weight:      Height:        Intake/Output Summary (Last 24 hours) at 04/12/2024 0916 Last data filed at 04/12/2024 0843 Gross per 24 hour  Intake 480 ml  Output 400 ml  Net 80 ml   Filed Weights   04/03/24 1552  Weight: (!) 157.6 kg    Scheduled Meds:  amLODipine   5 mg Oral Daily  divalproex   250 mg Oral BID   enoxaparin  (LOVENOX ) injection  80 mg Subcutaneous Q24H   gabapentin   600 mg Oral BID   venlafaxine  XR  75 mg Oral Q breakfast   Continuous Infusions:  Nutritional status     Body mass index is 56.08 kg/m.  Data Reviewed:   CBC: Recent Labs  Lab 04/05/24 1604 04/07/24 1525  WBC 4.2 3.4*  HGB 12.2 12.4  HCT 38.9 39.3  MCV 90.0 90.3  PLT 176 193   Basic Metabolic Panel: Recent Labs  Lab 04/05/24 1604 04/07/24 1525  NA 134* 137  K 3.9 3.8  CL 100 101  CO2  25 27  GLUCOSE 118* 137*  BUN 10 10  CREATININE 0.66 0.83  CALCIUM  8.6* 9.2   GFR: Estimated Creatinine Clearance: 116.4 mL/min (by C-G formula based on SCr of 0.83 mg/dL). Liver Function Tests: Recent Labs  Lab 04/05/24 1604 04/07/24 1525  AST 17 15  ALT 12 12  ALKPHOS 79 76  BILITOT 0.7 0.8  PROT 6.9 7.2  ALBUMIN 3.1* 3.1*   No results for input(s): LIPASE, AMYLASE in the last 168 hours. No results for input(s): AMMONIA in the last 168 hours.  Coagulation Profile: No results for input(s): INR, PROTIME in the last 168 hours. Cardiac Enzymes: No results for input(s): CKTOTAL, CKMB, CKMBINDEX, TROPONINI in the last 168 hours. BNP (last 3 results) No results for input(s): PROBNP in the last 8760 hours. HbA1C: No results for input(s): HGBA1C in the last 72 hours. CBG: Recent Labs  Lab 04/07/24 1157  GLUCAP 142*   Lipid Profile: No results for input(s): CHOL, HDL, LDLCALC, TRIG, CHOLHDL, LDLDIRECT in the last 72 hours. Thyroid  Function Tests: No results for input(s): TSH, T4TOTAL, FREET4, T3FREE, THYROIDAB in the last 72 hours. Anemia Panel: No results for input(s): VITAMINB12, FOLATE, FERRITIN, TIBC, IRON, RETICCTPCT in the last 72 hours. Sepsis Labs: No results for input(s): PROCALCITON, LATICACIDVEN in the last 168 hours.  Recent Results (from the past 240 hours)  Urine Culture     Status: Abnormal   Collection Time: 04/03/24  7:38 PM   Specimen: Urine, Clean Catch  Result Value Ref Range Status   Specimen Description   Final    URINE, CLEAN CATCH Performed at [facility], [address] [place], [place] [address]    Special Requests   Final    NONE Performed at [facility], [address] [place], [place] [address]    Culture >=100,000 COLONIES/mL ESCHERICHIA COLI (A)  Final   Report Status 04/06/2024 FINAL  Final   Organism ID, Bacteria ESCHERICHIA COLI  (A)  Final      Susceptibility   Escherichia coli - MIC*    AMPICILLIN <=2 SENSITIVE Sensitive     CEFAZOLIN <=4 SENSITIVE Sensitive     CEFEPIME <=0.12 SENSITIVE Sensitive     CEFTRIAXONE  <=0.25 SENSITIVE Sensitive     CIPROFLOXACIN  >=4 RESISTANT Resistant     GENTAMICIN <=1 SENSITIVE Sensitive     IMIPENEM <=0.25 SENSITIVE Sensitive     NITROFURANTOIN <=16 SENSITIVE Sensitive     TRIMETH /SULFA  <=20 SENSITIVE Sensitive     AMPICILLIN/SULBACTAM <=2 SENSITIVE Sensitive     PIP/TAZO <=4 SENSITIVE Sensitive ug/mL    * >=100,000 COLONIES/mL ESCHERICHIA COLI         Radiology Studies: No results found.      LOS: 8 days   Time spent= 38 mins    Deliliah Room, MD [facility]  If 7PM-7AM,  please contact night-coverage  04/12/2024, 9:16 AM

## 2024-04-13 ENCOUNTER — Encounter: Payer: Self-pay | Admitting: Hematology and Oncology

## 2024-04-13 DIAGNOSIS — N390 Urinary tract infection, site not specified: Secondary | ICD-10-CM | POA: Diagnosis not present

## 2024-04-13 NOTE — Progress Notes (Signed)
 Mobility Specialist - Progress Note   04/13/24 1217  Mobility  Activity Stood at bedside  Level of Assistance +2 (takes two people)  Nurse, learning disability of Motion/Exercises Active Assistive  Activity Response Tolerated fair  Mobility visit 1 Mobility  Mobility Specialist Start Time (ACUTE ONLY) 1207  Mobility Specialist Stop Time (ACUTE ONLY) 1217  Mobility Specialist Time Calculation (min) (ACUTE ONLY) 10 min   Pt was found in bed and agreeable to mobilize. Required max+2 assist for bed mobility and STS. Grew very fatigued with session and returned to bed with all needs met. Call bell in reach.  Erminio Leos,  Mobility Specialist Can be reached via Secure Chat

## 2024-04-13 NOTE — TOC Progression Note (Signed)
 Transition of Care [facility]) - Progression Note   Patient Details  Name: Monica Mccoy MRN: [id number] Date of Birth: Feb 27, 1966  Transition of [facility]) CM/SW Contact  Duwaine GORMAN Aran, LCSW Phone Number: 04/13/2024, 2:38 PM  Clinical Narrative: Patient's sister, Patrecia Molt, requested call for updates. CSW attempted to reach sister, but was unable to reach her so voicemail was left. CSW spoke with Dylan with [facility] to follow up regarding referral. Per Dylan, patient was declined due to lack of insurance and requiring a bariatric bed as the facility does not currently have any available.  Expected Discharge Plan: Skilled Nursing Facility Barriers to Discharge: SNF Pending bed offer, Inadequate or no insurance  Expected Discharge Plan and Services In-house Referral: NA Discharge Planning Services: CM Consult Post Acute Care Choice: NA Living arrangements for the past 2 months: Apartment Expected Discharge Date: 04/08/24               DME Arranged: N/A DME Agency: NA HH Arranged: NA HH Agency: NA  Social Drivers of Health (SDOH) Interventions SDOH Screenings   Food Insecurity: Food Insecurity Present (04/03/2024)  Housing: Unknown (04/03/2024)  Transportation Needs: Unmet Transportation Needs (04/03/2024)  Utilities: At Risk (04/03/2024)  Alcohol Screen: Low Risk  (03/27/2022)  Depression (PHQ2-9): High Risk (05/21/2022)  Financial Resource Strain: Low Risk  (03/27/2022)  Physical Activity: Insufficiently Active (03/27/2022)  Social Connections: Feeling Socially Integrated (01/11/2023)   Received from [facility]  Stress: No Stress Concern Present (03/27/2022)  Tobacco Use: Low Risk  (04/03/2024)   Readmission Risk Interventions    08/19/2023   11:05 AM 06/11/2022   11:48 AM 06/10/2022   10:14 AM  Readmission Risk Prevention Plan  Post Dischage Appt Complete    Medication Screening Complete    Transportation Screening Complete Complete Complete  PCP or Specialist Appt within  5-7 Days  Complete Complete  Home Care Screening  Complete Complete  Medication Review (RN CM)  Complete Complete

## 2024-04-13 NOTE — Plan of Care (Signed)
   Problem: Activity: Goal: Risk for activity intolerance will decrease Outcome: Progressing   Problem: Nutrition: Goal: Adequate nutrition will be maintained Outcome: Progressing

## 2024-04-13 NOTE — Progress Notes (Signed)
 PROGRESS NOTE    Monica Mccoy  FMW:[id number] DOB: Oct 17, 1965 DOA: 04/03/2024 PCP: Pcp, No   Brief Narrative:   58 y.o. female with medical history significant of breast cancer with brain mets, apparently in remission, cognitive impairment, morbid obesity, hypertension, chronic diastolic CHF comes to the hospital with increased confusion over the last 1 to 2 weeks.  Apparently she has been having increased urination episodes, urine has been foul-smelling for few days. Mentation is back to baseline now. Awaiting SNF placement.  Assessment & Plan:  Principal Problem:   Complicated UTI (urinary tract infection) Active Problems:   Morbid obesity (HCC)   Malignant neoplasm of female breast (HCC)   Debility   Pain    Acute metabolic encephalopathy: resolved. No evidence of acute stroke likely related to urinary tract infection.  She is awake, alert and agreeable to go to rehab facility.   PT seen the patient recommending SNF. TOC working on it.   Essential hypertension-continue amlodipine  and Lasix .     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 Bed bound. May benefit from anti-obesity medications as she does have obesity related complications (Adiposopathy)  DVT prophylaxis: Lovenox      Code Status: Full Code Family Communication:  None at the bedside Status is: Inpatient Remains inpatient appropriate because: Pending SNF placement    Subjective:  No acute issues overnight. Appears calm and comfortable. Denies any active complaints.  Examination:  General exam: Appears calm and comfortable  Respiratory system: Clear to auscultation.  Respiratory effort normal. Cardiovascular system: S1 & S2 heard, RRR. No JVD, murmurs, rubs, gallops or clicks. No pedal edema. Gastrointestinal system: Abdomen is nondistended, soft and nontender. No organomegaly or masses felt. Normal bowel sounds heard. Central nervous system: Alert and oriented. No focal neurological deficits. Extremities: Symmetric 5 x 5 power. Skin: No rashes, lesions or ulcers Psychiatry: Judgement and insight appear normal. Mood & affect appropriate.      Diet Orders (From admission, onward)     Start     Ordered   04/08/24 0000  Diet - low sodium heart healthy        04/08/24 1443   04/04/24 0221  Diet Heart Room service appropriate? Yes; Fluid consistency: Thin  Diet effective now       Question Answer Comment  Room service appropriate? Yes   Fluid consistency: Thin      04/04/24 0220            Objective: Vitals:   04/12/24 0505 04/12/24 1223 04/12/24 1942 04/13/24 0524  BP: 127/86 (!) 129/92 126/73 (!) 136/91  Pulse: 71 87 89 70  Resp: 19 16 17 17   Temp: 98.6 F (37 C) 97.6 F (36.4 C) 98.6 F (37 C) 98.6 F (37 C)  TempSrc: Oral Oral Oral Oral  SpO2: 93% 96% 97% 94%  Weight:      Height:        Intake/Output Summary (Last 24 hours) at 04/13/2024 0755 Last data filed at 04/13/2024 0300 Gross per 24 hour  Intake 720 ml  Output 1100 ml  Net -380 ml   Filed Weights   04/03/24 1552  Weight: (!) 157.6 kg    Scheduled Meds:  amLODipine   5 mg Oral  Daily   divalproex   250 mg Oral BID   enoxaparin  (LOVENOX ) injection  80 mg Subcutaneous Q24H   gabapentin   600 mg Oral BID   venlafaxine  XR  75 mg Oral Q breakfast   Continuous Infusions:  Nutritional status     Body mass index is 56.08 kg/m.  Data Reviewed:   CBC: Recent Labs  Lab 04/07/24 1525  WBC 3.4*  HGB 12.4  HCT 39.3  MCV 90.3  PLT 193   Basic Metabolic Panel: Recent Labs  Lab 04/07/24 1525  NA 137  K 3.8  CL 101  CO2 27  GLUCOSE 137*  BUN 10   CREATININE 0.83  CALCIUM  9.2   GFR: Estimated Creatinine Clearance: 116.4 mL/min (by C-G formula based on SCr of 0.83 mg/dL). Liver Function Tests: Recent Labs  Lab 04/07/24 1525  AST 15  ALT 12  ALKPHOS 76  BILITOT 0.8  PROT 7.2  ALBUMIN 3.1*   No results for input(s): LIPASE, AMYLASE in the last 168 hours. No results for input(s): AMMONIA in the last 168 hours.  Coagulation Profile: No results for input(s): INR, PROTIME in the last 168 hours. Cardiac Enzymes: No results for input(s): CKTOTAL, CKMB, CKMBINDEX, TROPONINI in the last 168 hours. BNP (last 3 results) No results for input(s): PROBNP in the last 8760 hours. HbA1C: No results for input(s): HGBA1C in the last 72 hours. CBG: Recent Labs  Lab 04/07/24 1157  GLUCAP 142*   Lipid Profile: No results for input(s): CHOL, HDL, LDLCALC, TRIG, CHOLHDL, LDLDIRECT in the last 72 hours. Thyroid  Function Tests: No results for input(s): TSH, T4TOTAL, FREET4, T3FREE, THYROIDAB in the last 72 hours. Anemia Panel: No results for input(s): VITAMINB12, FOLATE, FERRITIN, TIBC, IRON, RETICCTPCT in the last 72 hours. Sepsis Labs: No results for input(s): PROCALCITON, LATICACIDVEN in the last 168 hours.  Recent Results (from the past 240 hours)  Urine Culture     Status: Abnormal   Collection Time: 04/03/24  7:38 PM   Specimen: Urine, Clean Catch  Result Value Ref Range Status   Specimen Description   Final    URINE, CLEAN CATCH Performed at [facility], [address], [place], [place] [address]    Special Requests   Final    NONE Performed at [facility], [address] [place], [place] [address]    Culture >=100,000 COLONIES/mL ESCHERICHIA COLI (A)  Final   Report Status 04/06/2024 FINAL  Final   Organism ID, Bacteria ESCHERICHIA COLI (A)  Final      Susceptibility   Escherichia coli - MIC*    AMPICILLIN <=2  SENSITIVE Sensitive     CEFAZOLIN <=4 SENSITIVE Sensitive     CEFEPIME <=0.12 SENSITIVE Sensitive     CEFTRIAXONE  <=0.25 SENSITIVE Sensitive     CIPROFLOXACIN  >=4 RESISTANT Resistant     GENTAMICIN <=1 SENSITIVE Sensitive     IMIPENEM <=0.25 SENSITIVE Sensitive     NITROFURANTOIN <=16 SENSITIVE Sensitive     TRIMETH /SULFA  <=20 SENSITIVE Sensitive     AMPICILLIN/SULBACTAM <=2 SENSITIVE Sensitive     PIP/TAZO <=4 SENSITIVE Sensitive ug/mL    * >=100,000 COLONIES/mL ESCHERICHIA COLI         Radiology Studies: No results found.      LOS: 9 days   Time spent= 38 mins    Deliliah Room, MD [facility]  If 7PM-7AM, please contact night-coverage  04/13/2024, 7:55 AM

## 2024-04-14 ENCOUNTER — Inpatient Hospital Stay: Payer: Medicare (Managed Care) | Admitting: Family Medicine

## 2024-04-14 DIAGNOSIS — G9341 Metabolic encephalopathy: Secondary | ICD-10-CM

## 2024-04-14 DIAGNOSIS — I10 Essential (primary) hypertension: Secondary | ICD-10-CM

## 2024-04-14 DIAGNOSIS — K219 Gastro-esophageal reflux disease without esophagitis: Secondary | ICD-10-CM

## 2024-04-14 DIAGNOSIS — G620 Drug-induced polyneuropathy: Secondary | ICD-10-CM

## 2024-04-14 DIAGNOSIS — N390 Urinary tract infection, site not specified: Secondary | ICD-10-CM | POA: Diagnosis not present

## 2024-04-14 DIAGNOSIS — F331 Major depressive disorder, recurrent, moderate: Secondary | ICD-10-CM

## 2024-04-14 DIAGNOSIS — E66813 Obesity, class 3: Secondary | ICD-10-CM

## 2024-04-14 DIAGNOSIS — C50919 Malignant neoplasm of unspecified site of unspecified female breast: Secondary | ICD-10-CM

## 2024-04-14 DIAGNOSIS — T451X5A Adverse effect of antineoplastic and immunosuppressive drugs, initial encounter: Secondary | ICD-10-CM

## 2024-04-14 MED ORDER — PANTOPRAZOLE SODIUM 40 MG PO TBEC
40.0000 mg | DELAYED_RELEASE_TABLET | Freq: Every day | ORAL | Status: DC
Start: 2024-04-14 — End: 2024-04-21
  Administered 2024-04-14 – 2024-04-21 (×8): 40 mg via ORAL
  Filled 2024-04-14 (×8): qty 1

## 2024-04-14 MED ORDER — THIAMINE MONONITRATE 100 MG PO TABS
100.0000 mg | ORAL_TABLET | Freq: Every day | ORAL | Status: DC
  Administered 2024-04-14 – 2024-04-21 (×8): 100 mg via ORAL
  Filled 2024-04-14 (×8): qty 1

## 2024-04-14 MED ORDER — FUROSEMIDE 20 MG PO TABS
20.0000 mg | ORAL_TABLET | Freq: Every morning | ORAL | Status: DC
  Administered 2024-04-15 – 2024-04-21 (×7): 20 mg via ORAL
  Filled 2024-04-14 (×7): qty 1

## 2024-04-14 MED ORDER — MIRABEGRON ER 25 MG PO TB24
50.0000 mg | ORAL_TABLET | Freq: Every day | ORAL | Status: DC
  Administered 2024-04-14 – 2024-04-21 (×8): 50 mg via ORAL
  Filled 2024-04-14 (×8): qty 2

## 2024-04-14 NOTE — Plan of Care (Signed)
  Problem: Clinical Measurements: Goal: Ability to maintain clinical measurements within normal limits will improve Outcome: Progressing Goal: Will remain free from infection Outcome: Progressing Goal: Diagnostic test results will improve Outcome: Progressing Goal: Respiratory complications will improve Outcome: Progressing Goal: Cardiovascular complication will be avoided Outcome: Progressing   Problem: Activity: Goal: Risk for activity intolerance will decrease Outcome: Progressing   Problem: Nutrition: Goal: Adequate nutrition will be maintained Outcome: Progressing   Problem: Coping: Goal: Level of anxiety will decrease Outcome: Progressing   Problem: Pain Managment: Goal: General experience of comfort will improve and/or be controlled Outcome: Progressing   Problem: Safety: Goal: Ability to remain free from injury will improve Outcome: Progressing   Problem: Skin Integrity: Goal: Risk for impaired skin integrity will decrease Outcome: Progressing

## 2024-04-14 NOTE — Plan of Care (Signed)

## 2024-04-14 NOTE — Progress Notes (Signed)
 PROGRESS NOTE    Monica Mccoy  FMW:[id number] DOB: Oct 16, 1965 DOA: 04/03/2024 PCP: Pcp, No    Chief Complaint  Patient presents with   Altered Mental Status    Brief Narrative:  58 y.o. female with medical history significant of breast cancer with brain mets, apparently in remission, cognitive impairment, morbid obesity, hypertension, chronic diastolic CHF comes to the hospital with increased confusion over the last 1 to 2 weeks.  Apparently she has been having increased urination episodes, urine has been foul-smelling for few days. Mentation is back to baseline now. Awaiting SNF placement.    Assessment & Plan:   Principal Problem:   Complicated UTI (urinary tract infection) Active Problems:   Acute metabolic encephalopathy   Gastroesophageal reflux disease without esophagitis   Moderate episode of recurrent major depressive disorder (HCC)   Essential hypertension   Chemotherapy-induced neuropathy (HCC)   Obesity, Class III, BMI 40-49.9 (morbid obesity)   Morbid obesity (HCC)   Malignant neoplasm of female breast (HCC)   Debility   Pain   #1 acute metabolic encephalopathy -Secondary to UTI. -CT head negative for any acute abnormalities. - Patient awake and alert and noted to be back at baseline. - Awaiting SNF placement. - TOC consulted.  2.  E. coli UTI - Status post course of antibiotic treatment with IV Rocephin  initially and subsequently amoxicillin .  3.  Hypertension -Continue Norvasc  5 mg daily. - Lasix  held.  4.  Depression - Continue home regimen Effexor .  5.  Hypokalemia - Repleted.  6.  Chronic diastolic CHF/pulmonary hypertension -Resume home regimen Lasix .  7.  History of urinary retention -Resume home regimen of Myrbetriq .  8.  History of breast cancer -Outpatient follow-up.  9.  History of seizure disorder -Continue home regimen Depakote .  10.  Chemotherapy-induced neuropathy -Continue gabapentin  as prescribed by palliative care.  11.   GERD -PPI.  12.  Obesity class III -Patient noted to be bedbound. - Patient may benefit from antiobesity medications as she does have obesity related complications. - Lifestyle modification. - Outpatient follow-up with PCP.  DVT prophylaxis: Lovenox  Code Status: Full Family Communication: Updated patient.  No family at bedside. Disposition: Medically stable.  Awaiting SNF placement when bed available.  Status is: Inpatient Remains inpatient appropriate because: Unsafe disposition   Consultants:  Palliative care: Dr. Clayton 04/09/2024  Procedures:  CT head 04/03/2024 Chest x-ray 04/03/2024   Antimicrobials:  Anti-infectives (From admission, onward)    Start     Dose/Rate Route Frequency Ordered Stop   04/08/24 0600  amoxicillin  (AMOXIL ) capsule 500 mg        500 mg Oral Every 8 hours 04/07/24 1135 04/08/24 2112   04/08/24 0000  amoxicillin  (AMOXIL ) 500 MG capsule        500 mg Oral 3 times daily 04/08/24 1443     04/04/24 1000  cefTRIAXone  (ROCEPHIN ) 2 g in sodium chloride  0.9 % 100 mL IVPB  Status:  Discontinued        2 g 200 mL/hr over 30 Minutes Intravenous Every 24 hours 04/03/24 1939 04/03/24 1941   04/04/24 1000  cefTRIAXone  (ROCEPHIN ) 2 g in sodium chloride  0.9 % 100 mL IVPB  Status:  Discontinued        2 g 200 mL/hr over 30 Minutes Intravenous Every 24 hours 04/03/24 1941 04/07/24 1135   04/03/24 1745  cefTRIAXone  (ROCEPHIN ) 1 g in sodium chloride  0.9 % 100 mL IVPB        1 g 200 mL/hr over 30 Minutes Intravenous  Once 04/03/24 1742 04/03/24 1825         Subjective: Sitting up in bed eating lunch.  Denies any chest pain no shortness of breath.  No abdominal pain.  Objective: Vitals:   04/13/24 1535 04/13/24 2000 04/14/24 0529 04/14/24 1245  BP: (!) 155/98 (!) 122/104 (!) 116/91 (!) 142/87  Pulse: 86 65 72 90  Resp: 16 18 18 18   Temp: 98.2 F (36.8 C) 98.7 F (37.1 C) 98.5 F (36.9 C) 97.6 F (36.4 C)  TempSrc: Oral Oral Oral Oral  SpO2: 91% 96% 94% 95%   Weight:      Height:        Intake/Output Summary (Last 24 hours) at 04/14/2024 1819 Last data filed at 04/14/2024 9478 Gross per 24 hour  Intake --  Output 400 ml  Net -400 ml   Filed Weights   04/03/24 1552  Weight: (!) 157.6 kg    Examination:  General exam: Appears calm and comfortable  Respiratory system: Clear to auscultation anterior lung fields. Respiratory effort normal. Cardiovascular system: S1 & S2 heard, RRR. No JVD, murmurs, rubs, gallops or clicks. No pedal edema. Gastrointestinal system: Abdomen is nondistended, soft and nontender. No organomegaly or masses felt. Normal bowel sounds heard. Central nervous system: Alert and oriented. No focal neurological deficits. Extremities: Symmetric 5 x 5 power. Skin: No rashes, lesions or ulcers Psychiatry: Judgement and insight appear normal. Mood & affect appropriate.     Data Reviewed: I have personally reviewed following labs and imaging studies  CBC: No results for input(s): WBC, NEUTROABS, HGB, HCT, MCV, PLT in the last 168 hours.   Basic Metabolic Panel: No results for input(s): NA, K, CL, CO2, GLUCOSE, BUN, CREATININE, CALCIUM , MG, PHOS in the last 168 hours.   GFR: Estimated Creatinine Clearance: 116.4 mL/min (by C-G formula based on SCr of 0.83 mg/dL).  Liver Function Tests: No results for input(s): AST, ALT, ALKPHOS, BILITOT, PROT, ALBUMIN in the last 168 hours.   CBG: No results for input(s): GLUCAP in the last 168 hours.   No results found for this or any previous visit (from the past 240 hours).       Radiology Studies: No results found.      Scheduled Meds:  amLODipine   5 mg Oral Daily   divalproex   250 mg Oral BID   enoxaparin  (LOVENOX ) injection  80 mg Subcutaneous Q24H   [START ON 04/15/2024] furosemide   20 mg Oral q AM   gabapentin   600 mg Oral BID   mirabegron  ER  50 mg Oral Daily   pantoprazole   40 mg Oral Daily   thiamine   100  mg Oral Daily   venlafaxine  XR  75 mg Oral Q breakfast   Continuous Infusions:   LOS: 10 days    Time spent: 35 minutes    Toribio Hummer, MD [facility]   To contact the attending provider between 7A-7P or the covering provider during after hours 7P-7A, please log into the web site [email or web address] and access using universal [place] password for that web site. If you do not have the password, please call the hospital operator.  04/14/2024, 6:19 PM

## 2024-04-14 NOTE — Progress Notes (Signed)
 Physical Therapy Treatment Patient Details Name: Monica Mccoy MRN: [id number] DOB: 1965/11/27 Today's Date: 04/14/2024   History of Present Illness Pt is 58 yo female admitted on 04/03/24 with complicated UTI and acute metabolic encephalopathy. Pt had CT head with no acute findings.  Pt does have breast CA with brain mets with hx of brain surgery and masectomy that is apparently in remission.  Other hx including but not limited to cognitive impairment, morbid obesity, HTN, CHF, seizure, GERD, and anxiety.    PT Comments  Pt very sleepy/groggy with eyes shut 75% of treatment time.  A few mumbled words.  Facial grimacing with all mobility.  Unable to localize pain/discomfort. Assisted to EOB seated was very difficult.  General bed mobility comments: Pt unable to offer no more than 10% effort to transfer to EOB due to sleepy/groggy state.  Assisted with a seated wash up with RN and NT in room. Static sitting was poor.   Pt kept falling backward so placed multiple pillows for support as we continued to wash her frontal body.  Back to bed, supine required Total Assist + 2 Pt 0%.  Side to side rolling Total Assist for posterior wash up then Total Assist to scoot to [facility]. TOC working on discharge disposition.    If plan is discharge home, recommend the following: Two people to help with bathing/dressing/bathroom;Two people to help with walking and/or transfers;Assistance with cooking/housework;Assist for transportation;Help with stairs or ramp for entrance;Supervision due to cognitive status;Assistance with feeding   Can travel by private vehicle     No  Equipment Recommendations       Recommendations for Other Services       Precautions / Restrictions Precautions Precautions: Fall Recall of Precautions/Restrictions: Impaired Restrictions Weight Bearing Restrictions Per Provider Order: No     Mobility  Bed Mobility Overal bed mobility: Needs Assistance Bed Mobility: Supine to Sit, Sit to  Supine     Supine to sit: HOB elevated, Used rails, Max assist, +2 for physical assistance, +2 for safety/equipment Sit to supine: Total assist, +2 for physical assistance, +2 for safety/equipment   General bed mobility comments: Pt unable to offer no more than 10% effort to transfer to EOB due to sleepy/groggy state.  Assisted with a seated wash up with RN and NT in room. Static sitting was poor.   Pt kept falling backward so placed multiple pillows for support as we continued to wash her frontal body.  Back to bed, supine required Total Assist + 2 Pt 0%.  Side to side rolling Total Assist for posterior wash up then Total Assist to scoot to [facility].    Transfers                   General transfer comment: unable to attempt sit to stand due to poor sitting ability and lethargy.    Ambulation/Gait               General Gait Details: unable   Stairs             Wheelchair Mobility     Tilt Bed    Modified Rankin (Stroke Patients Only)       Balance                                            Communication Communication Communication: Impaired Factors Affecting Communication: Difficulty expressing self;Reduced  clarity of speech  Cognition Arousal: Lethargic Behavior During Therapy: Flat affect                           PT - Cognition Comments: Pt very sleepy/groggy with eyes shut 75% of treatment time.  A few mumbled words.  Facial grimacing with all mobility.  Unable to localize pain/discomfort. Following commands: Impaired Following commands impaired: Only follows one step commands consistently, Follows one step commands with increased time    Cueing Cueing Techniques: Verbal cues, Gestural cues, Visual cues, Tactile cues  Exercises      General Comments        Pertinent Vitals/Pain Pain Assessment Pain Assessment: Faces Faces Pain Scale: Hurts a little bit Pain Location: R arm and R foot Pain Descriptors /  Indicators: Grimacing, Guarding, Discomfort Pain Intervention(s): Monitored during session, Repositioned    Home Living                          Prior Function            PT Goals (current goals can now be found in the care plan section) Progress towards PT goals: Progressing toward goals    Frequency    Min 3X/week      PT Plan      Co-evaluation              AM-PAC PT 6 Clicks Mobility   Outcome Measure  Help needed turning from your back to your side while in a flat bed without using bedrails?: Total Help needed moving from lying on your back to sitting on the side of a flat bed without using bedrails?: Total Help needed moving to and from a bed to a chair (including a wheelchair)?: Total Help needed standing up from a chair using your arms (e.g., wheelchair or bedside chair)?: Total Help needed to walk in hospital room?: Total Help needed climbing 3-5 steps with a railing? : Total 6 Click Score: 6    End of Session Equipment Utilized During Treatment: Gait belt Activity Tolerance: Patient limited by fatigue Patient left: in bed;with call bell/phone within reach;with bed alarm set Nurse Communication: Mobility status;Need for lift equipment PT Visit Diagnosis: Other abnormalities of gait and mobility (R26.89);Muscle weakness (generalized) (M62.81)     Time: 8386-8351 PT Time Calculation (min) (ACUTE ONLY): 35 min  Charges:    $Therapeutic Activity: 23-37 mins PT General Charges $$ ACUTE PT VISIT: 1 Visit                    Katheryn Leap  PTA Acute  Rehabilitation Services Office M-F          [phone]

## 2024-04-15 DIAGNOSIS — N390 Urinary tract infection, site not specified: Secondary | ICD-10-CM | POA: Diagnosis not present

## 2024-04-15 LAB — CBC
HCT: 40.1 % (ref 36.0–46.0)
Hemoglobin: 12.2 g/dL (ref 12.0–15.0)
MCH: 27.5 pg (ref 26.0–34.0)
MCHC: 30.4 g/dL (ref 30.0–36.0)
MCV: 90.5 fL (ref 80.0–100.0)
Platelets: 172 K/uL (ref 150–400)
RBC: 4.43 MIL/uL (ref 3.87–5.11)
RDW: 13.9 % (ref 11.5–15.5)
WBC: 3 K/uL — ABNORMAL LOW (ref 4.0–10.5)
nRBC: 0 % (ref 0.0–0.2)

## 2024-04-15 LAB — BASIC METABOLIC PANEL WITH GFR
Anion gap: 8 (ref 5–15)
BUN: 8 mg/dL (ref 6–20)
CO2: 28 mmol/L (ref 22–32)
Calcium: 8.8 mg/dL — ABNORMAL LOW (ref 8.9–10.3)
Chloride: 100 mmol/L (ref 98–111)
Creatinine, Ser: 0.54 mg/dL (ref 0.44–1.00)
GFR, Estimated: >60 mL/min (ref >60)
Glucose, Bld: 98 mg/dL (ref 70–99)
Potassium: 3.1 mmol/L — ABNORMAL LOW (ref 3.5–5.1)
Sodium: 136 mmol/L (ref 135–145)

## 2024-04-15 LAB — MAGNESIUM: Magnesium: 1.9 mg/dL (ref 1.7–2.4)

## 2024-04-15 MED ORDER — POTASSIUM CHLORIDE CRYS ER 20 MEQ PO TBCR
40.0000 meq | EXTENDED_RELEASE_TABLET | Freq: Two times a day (BID) | ORAL | Status: DC
  Administered 2024-04-15 (×2): 40 meq via ORAL
  Filled 2024-04-15 (×2): qty 2

## 2024-04-15 NOTE — Progress Notes (Signed)
 PROGRESS NOTE  Monica Mccoy FMW:[id number] DOB: 02/20/1966 DOA: 04/03/2024 PCP: Pcp, No   LOS: 11 days   Brief narrative:  58 y.o. female with past medical history significant for breast cancer with brain mets, apparently in remission, cognitive impairment, morbid obesity, hypertension, chronic diastolic CHF presented to hospital with increased confusion for 1 to 2 weeks with increased urinary episodes and foul-smelling urine.  Patient was then admitted hospital for further evaluation and treatment.  At this time patient is at baseline and is awaiting for skilled nursing facility.    Assessment/Plan: Principal Problem:   Complicated UTI (urinary tract infection) Active Problems:   Acute metabolic encephalopathy   Gastroesophageal reflux disease without esophagitis   Moderate episode of recurrent major depressive disorder (HCC)   Essential hypertension   Chemotherapy-induced neuropathy (HCC)   Obesity, Class III, BMI 40-49.9 (morbid obesity)   Morbid obesity (HCC)   Malignant neoplasm of female breast (HCC)   Debility   Pain   Acute metabolic encephalopathy secondary to E. coli UTI. Likely at baseline at this time.[name]  Has completed course of antibiotic. CT head was negative.  Awaiting skilled nursing facility  placement   Essential Hypertension Continue Norvasc  and Lasix     Depression Continue Effexor .   Hypokalemia Potassium of 3.1 today.  Will replace with 40 mEq twice daily for 3 doses.    Chronic diastolic CHF/pulmonary hypertension Continue Lasix .    History of urinary retention Continue Myrbetriq    History of breast cancer Outpatient follow-up recommended   History of seizure disorder -Continue home regimen Depakote .   History of chemotherapy-induced neuropathy Continue gabapentin  as prescribed by palliative care.   GERD Continue PPI   Obesity class III Body mass index is 56.08 kg/m.   patient is largely bedbound.  Deconditioning debility.  Awaiting  for skilled nursing facility placement.  TOC on board.   DVT prophylaxis: Lovenox    Disposition: Skilled nursing facility.  Medically stable for disposition  Status is: Inpatient Remains inpatient appropriate because: Awaiting for skilled nursing facility placement    Code Status:     Code Status: Full Code  Family Communication: None at bedside  Consultants: Palliative care  Procedures: None  Anti-infectives:  None  Anti-infectives (From admission, onward)    Start     Dose/Rate Route Frequency Ordered Stop   04/08/24 0600  amoxicillin  (AMOXIL ) capsule 500 mg        500 mg Oral Every 8 hours 04/07/24 1135 04/08/24 2112   04/08/24 0000  amoxicillin  (AMOXIL ) 500 MG capsule        500 mg Oral 3 times daily 04/08/24 1443     04/04/24 1000  cefTRIAXone  (ROCEPHIN ) 2 g in sodium chloride  0.9 % 100 mL IVPB  Status:  Discontinued        2 g 200 mL/hr over 30 Minutes Intravenous Every 24 hours 04/03/24 1939 04/03/24 1941   04/04/24 1000  cefTRIAXone  (ROCEPHIN ) 2 g in sodium chloride  0.9 % 100 mL IVPB  Status:  Discontinued        2 g 200 mL/hr over 30 Minutes Intravenous Every 24 hours 04/03/24 1941 04/07/24 1135   04/03/24 1745  cefTRIAXone  (ROCEPHIN ) 1 g in sodium chloride  0.9 % 100 mL IVPB        1 g 200 mL/hr over 30 Minutes Intravenous  Once 04/03/24 1742 04/03/24 1825        Subjective: Today, patient was seen and examined at bedside.  Patient alert awake communicative.  Denies any pain nausea  vomiting.  Objective: Vitals:   04/14/24 1950 04/15/24 0340  BP: 134/81 (!) 140/85  Pulse: 86 70  Resp: 18 16  Temp: 97.7 F (36.5 C) 98.1 F (36.7 C)  SpO2: 95% 93%    Intake/Output Summary (Last 24 hours) at 04/15/2024 1126 Last data filed at 04/15/2024 0343 Gross per 24 hour  Intake --  Output 200 ml  Net -200 ml   Filed Weights   04/03/24 1552  Weight: (!) 157.6 kg   Body mass index is 56.08 kg/m.   Physical Exam: GENERAL: Patient is alert awake and  Communicative.. Not in obvious distress.  Obese built HENT: No scleral pallor or icterus. Pupils equally reactive to light. Oral mucosa is moist NECK: is supple, no gross swelling noted. CHEST:. No crackles or wheezes.  Diminished breath sounds bilaterally. CVS: S1 and S2 heard, no murmur. Regular rate and rhythm.  ABDOMEN: Soft, non-tender, bowel sounds are present. EXTREMITIES exam palpation of the bilateral legs and stomach area. CNS: Cranial nerves are intact.  Moves extremities. SKIN: warm and dry without rashes.  Data Review: I have personally reviewed the following laboratory data and studies,  CBC: Recent Labs  Lab 04/15/24 0426  WBC 3.0*  HGB 12.2  HCT 40.1  MCV 90.5  PLT 172   Basic Metabolic Panel: Recent Labs  Lab 04/15/24 0426  NA 136  K 3.1*  CL 100  CO2 28  GLUCOSE 98  BUN 8  CREATININE 0.54  CALCIUM  8.8*  MG 1.9   Liver Function Tests: No results for input(s): AST, ALT, ALKPHOS, BILITOT, PROT, ALBUMIN in the last 168 hours. No results for input(s): LIPASE, AMYLASE in the last 168 hours. No results for input(s): AMMONIA in the last 168 hours. Cardiac Enzymes: No results for input(s): CKTOTAL, CKMB, CKMBINDEX, TROPONINI in the last 168 hours. BNP (last 3 results) No results for input(s): BNP in the last 8760 hours.  ProBNP (last 3 results) No results for input(s): PROBNP in the last 8760 hours.  CBG: No results for input(s): GLUCAP in the last 168 hours. No results found for this or any previous visit (from the past 240 hours).   Studies: No results found.    Travaris Kosh, MD  [facility] 04/15/2024  If 7PM-7AM, please contact night-coverage

## 2024-04-15 NOTE — Plan of Care (Signed)

## 2024-04-15 NOTE — Progress Notes (Signed)
 Occupational Therapy Treatment Patient Details Name: Monica Mccoy MRN: [id number] DOB: Jul 02, 1966 Today's Date: 04/15/2024   History of present illness Pt is 58 yr old female admitted on 04/03/24 with complicated UTI and acute metabolic encephalopathy. Pt does have breast CA with brain mets with history of brain surgery and mastectomy  PMH: cognitive impairment, morbid obesity, HTN, CHF, seizure, GERD, and anxiety.   OT comments  The pt was seen functional strengthening, ADL instruction and progression of ADL participation. She required max assist x2 for supine to sit. She presented with poor sitting balance EOB, with noted posterior and lateral leaning, initially requiring max assist for balance; she progressed to needing brief moments of min assist for balance. She further participated in functional reaching, self-feeding, and upper body grooming seated EOB, requiring min to mod assist in this regard (see ADL section below for details). She required intermittent assistance for sequencing and problem solving throughout the session. Continue OT plan of care. Patient will benefit from continued inpatient follow up therapy, <3 hours/day.       If plan is discharge home, recommend the following:  A lot of help with bathing/dressing/bathroom;Two people to help with walking and/or transfers;Supervision due to cognitive status;Assistance with cooking/housework;Assistance with feeding;Direct supervision/assist for medications management;Direct supervision/assist for financial management;Help with stairs or ramp for entrance;Assist for transportation   Equipment Recommendations  Other (comment) (to be determined pending progress at next level of care)    Recommendations for Other Services      Precautions / Restrictions Precautions Precautions: Fall Recall of Precautions/Restrictions: Impaired Restrictions Weight Bearing Restrictions Per Provider Order: No       Mobility Bed Mobility Overal bed  mobility: Needs Assistance Bed Mobility: Sit to Supine, Supine to Sit     Supine to sit: Max assist, Used rails, HOB elevated, +2 for physical assistance Sit to supine: Max assist   General bed mobility comments: She required increased cues for sequencing, including advancing BLE off the bed, reaching with UE, and trunk positioning, in order to perform supine to sit.       Balance     Sitting balance-Leahy Scale: Poor Sitting balance - Comments: She initially required max assist for sitting balance EOB, given posterior and lateral leaning. She improved to needing brief instances of min assist .         ADL either performed or assessed with clinical judgement   ADL Overall ADL's : Needs assistance/impaired Eating/Feeding: Sitting;Minimal assistance;Moderate assistance Eating/Feeding Details (indicate cue type and reason): Once seated EOB, she required intermittent min assistance to bring a cup to her mouth to drink from it on several instances. She appeared to demo a slight dysmetric like presentation when reaching forward to grasp cup, requiring cues for improved precision. She further required light assistance to ensure the straw was placed in her mouth. Min to mod assist was required to maintain sitting balance EOB. Grooming: Minimal assistance;Sitting;Cueing for sequencing Grooming Details (indicate cue type and reason): The pt was instructed on performing face washing and teeth brushing seated EOB. She required occasional assist for item manipulation and management, as she appeared to have some fine motor coordination deficits. She further required occasional verbal and tactile cues to access self-care items, for improved precision to bring items to her face, and for thoroughness with tasks. Min to mod assist was required to maintain sitting balance EOB.             Lower Body Dressing: Total assistance;Bed level Lower Body Dressing Details (indicate  cue type and reason): to  donn socks at bed level                    Communication Communication Factors Affecting Communication: Difficulty expressing self;Reduced clarity of speech   Cognition Arousal: Alert   Cognition: History of cognitive impairments, Cognition impaired       Memory impairment (select all impairments): Declarative long-term memory Attention impairment (select first level of impairment): Divided attention Executive functioning impairment (select all impairments): Sequencing, Problem solving, Organization                   Following commands: Impaired Following commands impaired: Follows one step commands with increased time (and occasionally repetition of prompts)      Cueing   Cueing Techniques: Verbal cues, Gestural cues, Visual cues, Tactile cues             Pertinent Vitals/ Pain       Pain Assessment Pain Location: She did not report having pain during the session.   Frequency  Min 2X/week        Progress Toward Goals  OT Goals(current goals can now be found in the care plan section)     Acute Rehab OT Goals OT Goal Formulation: Patient unable to participate in goal setting Time For Goal Achievement: 04/19/24 Potential to Achieve Goals: Fair  Plan         AM-PAC OT 6 Clicks Daily Activity     Outcome Measure   Help from another person eating meals?: A Little Help from another person taking care of personal grooming?: A Lot Help from another person toileting, which includes using toliet, bedpan, or urinal?: A Lot Help from another person bathing (including washing, rinsing, drying)?: A Lot Help from another person to put on and taking off regular upper body clothing?: A Lot Help from another person to put on and taking off regular lower body clothing?: A Lot 6 Click Score: 13    End of Session Equipment Utilized During Treatment: Other (comment) (N/A)  OT Visit Diagnosis: Cognitive communication deficit (R41.841);Unsteadiness on feet  (R26.81);Muscle weakness (generalized) (M62.81);Other symptoms and signs involving cognitive function;Other abnormalities of gait and mobility (R26.89)   Activity Tolerance Other (comment) (fair tolerance)   Patient Left in bed;with call bell/phone within reach;with bed alarm set   Nurse Communication Mobility status        Time: 8667-8643 OT Time Calculation (min): 24 min  Charges: OT General Charges $OT Visit: 1 Visit OT Treatments $Self Care/Home Management : 8-22 mins $Therapeutic Activity: 8-22 mins     Delanna JINNY Lesches, OTR/L 04/15/2024, 5:22 PM

## 2024-04-15 NOTE — TOC Progression Note (Addendum)
 Transition of Care [facility]) - Progression Note    Patient Details  Name: Monica Mccoy MRN: [id number] Date of Birth: October 17, 1965  Transition of Care [facility]) CM/SW Contact  Arland Usery, Nathanel, RN Phone Number: 04/15/2024, 12:43 PM  Clinical Narrative: [name] [place]  to check if able to accept w/insurance, & her office-await call back. - [facility]  Accepted -- [address] [place] [address] [phone] --  [facility] SNF  Accepted -- [address], [place] [address] [phone]     -2:39p spoke to sisters on speaker phone [name], & Varnetta about ST SNF choices, left list in rm-await choice.   Expected Discharge Plan: Skilled Nursing Facility Barriers to Discharge: SNF Pending bed offer, Inadequate or no insurance               Expected Discharge Plan and Services In-house Referral: NA Discharge Planning Services: CM Consult Post Acute Care Choice: NA Living arrangements for the past 2 months: Apartment Expected Discharge Date: 04/08/24               DME Arranged: N/A DME Agency: NA       HH Arranged: NA HH Agency: NA         Social Drivers of Health (SDOH) Interventions SDOH Screenings   Food Insecurity: Food Insecurity Present (04/03/2024)  Housing: Unknown (04/03/2024)  Transportation Needs: Unmet Transportation Needs (04/03/2024)  Utilities: At Risk (04/03/2024)  Alcohol Screen: Low Risk  (03/27/2022)  Depression (PHQ2-9): High Risk (05/21/2022)  Financial Resource Strain: Low Risk  (03/27/2022)  Physical Activity: Insufficiently Active (03/27/2022)  Social Connections: Feeling Socially Integrated (01/11/2023)   Received from [facility]  Stress: No Stress Concern Present (03/27/2022)  Tobacco Use: Low Risk  (04/03/2024)    Readmission Risk Interventions    08/19/2023   11:05 AM 06/11/2022   11:48 AM 06/10/2022   10:14 AM  Readmission Risk Prevention  Plan  Post Dischage Appt Complete    Medication Screening Complete    Transportation Screening Complete Complete Complete  PCP or Specialist Appt within 5-7 Days  Complete Complete  Home Care Screening  Complete Complete  Medication Review (RN CM)  Complete Complete

## 2024-04-15 NOTE — Hospital Course (Signed)
 58 y.o. female with past medical history significant for breast cancer with brain mets, apparently in remission, cognitive impairment, morbid obesity, hypertension, chronic diastolic CHF presented to hospital with increased confusion for 1 to 2 weeks with increased urinary episodes and foul-smelling urine.  Patient was then admitted hospital for further evaluation and treatment.  At this time patient is at baseline and is awaiting for skilled nursing facility.    acute metabolic encephalopathy secondary to E. coli UTI. Has resolved at this time.  Has completed course of antibiotic CT head was negative.  Awaiting vascular service for placement   Essential Hypertension Continue Norvasc  and Lasix     Depression Continue Effexor .   Hypokalemia Potassium of 3.1 today.  Will replace with 40 mEq twice daily for 3 doses.    Chronic diastolic CHF/pulmonary hypertension Continue Lasix .    History of urinary retention Continue Myrbetriq    History of breast cancer Outpatient follow-up recommended   History of seizure disorder -Continue home regimen Depakote .   History of chemotherapy-induced neuropathy Continue gabapentin  as prescribed by palliative care.   GERD Continue PPI   Obesity class III Body mass index is 56.08 kg/m.  Lifestyle modifications advised, patient is largely bedbound.  Deconditioning debility.  Awaiting for skilled nursing facility placement.  TOC on board.

## 2024-04-15 NOTE — Discharge Summary (Deleted)
 PROGRESS NOTE  Monica Mccoy FMW:[id number] DOB: 02/20/1966 DOA: 04/03/2024 PCP: Pcp, No   LOS: 11 days   Brief narrative:  58 y.o. female with past medical history significant for breast cancer with brain mets, apparently in remission, cognitive impairment, morbid obesity, hypertension, chronic diastolic CHF presented to hospital with increased confusion for 1 to 2 weeks with increased urinary episodes and foul-smelling urine.  Patient was then admitted hospital for further evaluation and treatment.  At this time patient is at baseline and is awaiting for skilled nursing facility.    Assessment/Plan: Principal Problem:   Complicated UTI (urinary tract infection) Active Problems:   Acute metabolic encephalopathy   Gastroesophageal reflux disease without esophagitis   Moderate episode of recurrent major depressive disorder (HCC)   Essential hypertension   Chemotherapy-induced neuropathy (HCC)   Obesity, Class III, BMI 40-49.9 (morbid obesity)   Morbid obesity (HCC)   Malignant neoplasm of female breast (HCC)   Debility   Pain   Acute metabolic encephalopathy secondary to E. coli UTI. Likely at baseline at this time.Monica Mccoy  Has completed course of antibiotic. CT head was negative.  Awaiting skilled nursing facility  placement   Essential Hypertension Continue Norvasc  and Lasix     Depression Continue Effexor .   Hypokalemia Potassium of 3.1 today.  Will replace with 40 mEq twice daily for 3 doses.    Chronic diastolic CHF/pulmonary hypertension Continue Lasix .    History of urinary retention Continue Myrbetriq    History of breast cancer Outpatient follow-up recommended   History of seizure disorder -Continue home regimen Depakote .   History of chemotherapy-induced neuropathy Continue gabapentin  as prescribed by palliative care.   GERD Continue PPI   Obesity class III Body mass index is 56.08 kg/m.   patient is largely bedbound.  Deconditioning debility.  Awaiting  for skilled nursing facility placement.  TOC on board.   DVT prophylaxis: Lovenox    Disposition: Skilled nursing facility.  Medically stable for disposition  Status is: Inpatient Remains inpatient appropriate because: Awaiting for skilled nursing facility placement    Code Status:     Code Status: Full Code  Family Communication: None at bedside  Consultants: Palliative care  Procedures: None  Anti-infectives:  None  Anti-infectives (From admission, onward)    Start     Dose/Rate Route Frequency Ordered Stop   04/08/24 0600  amoxicillin  (AMOXIL ) capsule 500 mg        500 mg Oral Every 8 hours 04/07/24 1135 04/08/24 2112   04/08/24 0000  amoxicillin  (AMOXIL ) 500 MG capsule        500 mg Oral 3 times daily 04/08/24 1443     04/04/24 1000  cefTRIAXone  (ROCEPHIN ) 2 g in sodium chloride  0.9 % 100 mL IVPB  Status:  Discontinued        2 g 200 mL/hr over 30 Minutes Intravenous Every 24 hours 04/03/24 1939 04/03/24 1941   04/04/24 1000  cefTRIAXone  (ROCEPHIN ) 2 g in sodium chloride  0.9 % 100 mL IVPB  Status:  Discontinued        2 g 200 mL/hr over 30 Minutes Intravenous Every 24 hours 04/03/24 1941 04/07/24 1135   04/03/24 1745  cefTRIAXone  (ROCEPHIN ) 1 g in sodium chloride  0.9 % 100 mL IVPB        1 g 200 mL/hr over 30 Minutes Intravenous  Once 04/03/24 1742 04/03/24 1825        Subjective: Today, patient was seen and examined at bedside.  Patient alert awake communicative.  Denies any pain nausea  vomiting.  Objective: Vitals:   04/14/24 1950 04/15/24 0340  BP: 134/81 (!) 140/85  Pulse: 86 70  Resp: 18 16  Temp: 97.7 F (36.5 C) 98.1 F (36.7 C)  SpO2: 95% 93%    Intake/Output Summary (Last 24 hours) at 04/15/2024 1126 Last data filed at 04/15/2024 0343 Gross per 24 hour  Intake --  Output 200 ml  Net -200 ml   Filed Weights   04/03/24 1552  Weight: (!) 157.6 kg   Body mass index is 56.08 kg/m.   Physical Exam: GENERAL: Patient is alert awake and  Communicative.. Not in obvious distress.  Obese built HENT: No scleral pallor or icterus. Pupils equally reactive to light. Oral mucosa is moist NECK: is supple, no gross swelling noted. CHEST:. No crackles or wheezes.  Diminished breath sounds bilaterally. CVS: S1 and S2 heard, no murmur. Regular rate and rhythm.  ABDOMEN: Soft, non-tender, bowel sounds are present. EXTREMITIES exam palpation of the bilateral legs and stomach area. CNS: Cranial nerves are intact.  Moves extremities. SKIN: warm and dry without rashes.  Data Review: I have personally reviewed the following laboratory data and studies,  CBC: Recent Labs  Lab 04/15/24 0426  WBC 3.0*  HGB 12.2  HCT 40.1  MCV 90.5  PLT 172   Basic Metabolic Panel: Recent Labs  Lab 04/15/24 0426  NA 136  K 3.1*  CL 100  CO2 28  GLUCOSE 98  BUN 8  CREATININE 0.54  CALCIUM  8.8*  MG 1.9   Liver Function Tests: No results for input(s): AST, ALT, ALKPHOS, BILITOT, PROT, ALBUMIN in the last 168 hours. No results for input(s): LIPASE, AMYLASE in the last 168 hours. No results for input(s): AMMONIA in the last 168 hours. Cardiac Enzymes: No results for input(s): CKTOTAL, CKMB, CKMBINDEX, TROPONINI in the last 168 hours. BNP (last 3 results) No results for input(s): BNP in the last 8760 hours.  ProBNP (last 3 results) No results for input(s): PROBNP in the last 8760 hours.  CBG: No results for input(s): GLUCAP in the last 168 hours. No results found for this or any previous visit (from the past 240 hours).   Studies: No results found.    Travaris Kosh, MD  [facility] 04/15/2024  If 7PM-7AM, please contact night-coverage

## 2024-04-15 NOTE — Discharge Summary (Deleted)
 PROGRESS NOTE  Monica Mccoy FMW:[id number] DOB: 11-15-1965 DOA: 04/03/2024 PCP: Pcp, No   LOS: 11 days   Brief narrative:  58 y.o. female with past medical history significant for breast cancer with brain mets, apparently in remission, cognitive impairment, morbid obesity, hypertension, chronic diastolic CHF presented to hospital with increased confusion for 1 to 2 weeks with increased urinary episodes and foul-smelling urine.  Patient was then admitted hospital for further evaluation and treatment.  At this time patient is at baseline and is awaiting for skilled nursing facility.    Assessment/Plan: Principal Problem:   Complicated UTI (urinary tract infection) Active Problems:   Acute metabolic encephalopathy   Gastroesophageal reflux disease without esophagitis   Moderate episode of recurrent major depressive disorder (HCC)   Essential hypertension   Chemotherapy-induced neuropathy (HCC)   Obesity, Class III, BMI 40-49.9 (morbid obesity)   Morbid obesity (HCC)   Malignant neoplasm of female breast (HCC)   Debility   Pain   Acute metabolic encephalopathy secondary to E. coli UTI. Likely at baseline at this time.[name]  Has completed course of antibiotic. CT head was negative.  Awaiting skilled nursing facility  placement   Essential Hypertension Continue Norvasc  and Lasix     Depression Continue Effexor .   Hypokalemia Potassium of 3.1 today.  Will replace with 40 mEq twice daily for 3 doses.    Chronic diastolic CHF/pulmonary hypertension Continue Lasix .    History of urinary retention Continue Myrbetriq    History of breast cancer Outpatient follow-up recommended   History of seizure disorder -Continue home regimen Depakote .   History of chemotherapy-induced neuropathy Continue gabapentin  as prescribed by palliative care.   GERD Continue PPI   Obesity class III Body mass index is 56.08 kg/m.   patient is largely bedbound.  Deconditioning debility.  Awaiting  for skilled nursing facility placement.  TOC on board.   DVT prophylaxis: Lovenox    Disposition: Skilled nursing facility.  Medically stable for disposition  Status is: Inpatient Remains inpatient appropriate because: Awaiting for skilled nursing facility placement    Code Status:     Code Status: Full Code  Family Communication: None at bedside  Consultants: Palliative care  Procedures: None  Anti-infectives:  None  Anti-infectives (From admission, onward)    Start     Dose/Rate Route Frequency Ordered Stop   04/08/24 0600  amoxicillin  (AMOXIL ) capsule 500 mg        500 mg Oral Every 8 hours 04/07/24 1135 04/08/24 2112   04/08/24 0000  amoxicillin  (AMOXIL ) 500 MG capsule        500 mg Oral 3 times daily 04/08/24 1443     04/04/24 1000  cefTRIAXone  (ROCEPHIN ) 2 g in sodium chloride  0.9 % 100 mL IVPB  Status:  Discontinued        2 g 200 mL/hr over 30 Minutes Intravenous Every 24 hours 04/03/24 1939 04/03/24 1941   04/04/24 1000  cefTRIAXone  (ROCEPHIN ) 2 g in sodium chloride  0.9 % 100 mL IVPB  Status:  Discontinued        2 g 200 mL/hr over 30 Minutes Intravenous Every 24 hours 04/03/24 1941 04/07/24 1135   04/03/24 1745  cefTRIAXone  (ROCEPHIN ) 1 g in sodium chloride  0.9 % 100 mL IVPB        1 g 200 mL/hr over 30 Minutes Intravenous  Once 04/03/24 1742 04/03/24 1825        Subjective: Today, patient was seen and examined at bedside.  Patient alert awake communicative.  Denies any pain nausea  vomiting.  Objective: Vitals:   04/14/24 1950 04/15/24 0340  BP: 134/81 (!) 140/85  Pulse: 86 70  Resp: 18 16  Temp: 97.7 F (36.5 C) 98.1 F (36.7 C)  SpO2: 95% 93%    Intake/Output Summary (Last 24 hours) at 04/15/2024 1122 Last data filed at 04/15/2024 0343 Gross per 24 hour  Intake --  Output 200 ml  Net -200 ml   Filed Weights   04/03/24 1552  Weight: (!) 157.6 kg   Body mass index is 56.08 kg/m.   Physical Exam: GENERAL: Patient is alert awake and  Communicative.. Not in obvious distress.  Obese built HENT: No scleral pallor or icterus. Pupils equally reactive to light. Oral mucosa is moist NECK: is supple, no gross swelling noted. CHEST:. No crackles or wheezes.  Diminished breath sounds bilaterally. CVS: S1 and S2 heard, no murmur. Regular rate and rhythm.  ABDOMEN: Soft, non-tender, bowel sounds are present. EXTREMITIES exam palpation of the bilateral legs and stomach area. CNS: Cranial nerves are intact.  Moves extremities. SKIN: warm and dry without rashes.  Data Review: I have personally reviewed the following laboratory data and studies,  CBC: Recent Labs  Lab 04/15/24 0426  WBC 3.0*  HGB 12.2  HCT 40.1  MCV 90.5  PLT 172   Basic Metabolic Panel: Recent Labs  Lab 04/15/24 0426  NA 136  K 3.1*  CL 100  CO2 28  GLUCOSE 98  BUN 8  CREATININE 0.54  CALCIUM  8.8*  MG 1.9   Liver Function Tests: No results for input(s): AST, ALT, ALKPHOS, BILITOT, PROT, ALBUMIN in the last 168 hours. No results for input(s): LIPASE, AMYLASE in the last 168 hours. No results for input(s): AMMONIA in the last 168 hours. Cardiac Enzymes: No results for input(s): CKTOTAL, CKMB, CKMBINDEX, TROPONINI in the last 168 hours. BNP (last 3 results) No results for input(s): BNP in the last 8760 hours.  ProBNP (last 3 results) No results for input(s): PROBNP in the last 8760 hours.  CBG: No results for input(s): GLUCAP in the last 168 hours. No results found for this or any previous visit (from the past 240 hours).   Studies: No results found.    Jackalynn Art, MD  [facility] 04/15/2024  If 7PM-7AM, please contact night-coverage

## 2024-04-16 DIAGNOSIS — N390 Urinary tract infection, site not specified: Secondary | ICD-10-CM | POA: Diagnosis not present

## 2024-04-16 MED ORDER — POTASSIUM CHLORIDE CRYS ER 20 MEQ PO TBCR
40.0000 meq | EXTENDED_RELEASE_TABLET | Freq: Two times a day (BID) | ORAL | Status: AC
  Administered 2024-04-16 (×2): 40 meq via ORAL
  Filled 2024-04-16 (×2): qty 2

## 2024-04-16 NOTE — TOC Progression Note (Signed)
 Transition of Care [facility]) - Progression Note    Patient Details  Name: Monica Mccoy MRN: [id number] Date of Birth: 21-Dec-1965  Transition of [facility]) CM/SW Contact  Heather DELENA Saltness, LCSW Phone Number: 04/16/2024, 4:25 PM  Clinical Narrative:    CSW attempted to speak with pt's sister, [name] Ill [phone], via phone call to discuss SNF bed choice. CSW was unable to speak with pt's sister due to no answer, voicemail left.   Expected Discharge Plan: Skilled Nursing Facility Barriers to Discharge: SNF Pending bed offer, Inadequate or no insurance   Expected Discharge Plan and Services In-house Referral: NA Discharge Planning Services: CM Consult Post Acute Care Choice: NA Living arrangements for the past 2 months: Apartment Expected Discharge Date: 04/08/24               DME Arranged: N/A DME Agency: NA       HH Arranged: NA HH Agency: NA         Social Drivers of Health (SDOH) Interventions SDOH Screenings   Food Insecurity: Food Insecurity Present (04/03/2024)  Housing: Unknown (04/03/2024)  Transportation Needs: Unmet Transportation Needs (04/03/2024)  Utilities: At Risk (04/03/2024)  Alcohol Screen: Low Risk  (03/27/2022)  Depression (PHQ2-9): High Risk (05/21/2022)  Financial Resource Strain: Low Risk  (03/27/2022)  Physical Activity: Insufficiently Active (03/27/2022)  Social Connections: Feeling Socially Integrated (01/11/2023)   Received from [facility]  Stress: No Stress Concern Present (03/27/2022)  Tobacco Use: Low Risk  (04/03/2024)    Readmission Risk Interventions    08/19/2023   11:05 AM 06/11/2022   11:48 AM 06/10/2022   10:14 AM  Readmission Risk Prevention Plan  Post Dischage Appt Complete    Medication Screening Complete    Transportation Screening Complete Complete Complete  PCP or Specialist Appt within 5-7 Days  Complete Complete  Home Care Screening  Complete Complete  Medication Review (RN CM)  Complete Complete    Signed: Heather Saltness, MSW, LCSW Clinical Social Worker Inpatient Care Management 04/16/2024 4:26 PM

## 2024-04-16 NOTE — Progress Notes (Signed)
 PMT brief progress note.  Patient is resting comfortably in bed.  No distress Religious music in the room.  Chart reviewed.  BP 110/80   Pulse 78   Temp 98.5 F (36.9 C) (Oral)   Resp 18   Ht 5' 6 (1.676 m)   Wt (!) 157.6 kg   SpO2 96%   BMI 56.08 kg/m  Labs and imaging noted.  TRH MD note and TOC note reviewed.  Awaiting SNF discharge.  Patient is already connected with palliative care at [facility], sees my colleague Ms Cousar NP Recommend ongoing outpatient palliative for GOC discussions.  Low MDM Lonia Serve MD [place] palliative.

## 2024-04-16 NOTE — Plan of Care (Signed)

## 2024-04-16 NOTE — Progress Notes (Signed)
 PROGRESS NOTE  Monica Mccoy FMW:[id number] DOB: Dec 10, 1965 DOA: 04/03/2024 PCP: Pcp, No   LOS: 12 days   Brief narrative:  58 y.o. female with past medical history significant for breast cancer with brain mets, apparently in remission, cognitive impairment, morbid obesity, hypertension, chronic diastolic CHF presented to hospital with increased confusion for 1 to 2 weeks with increased urinary episodes and foul-smelling urine.  Patient was then admitted hospital for further evaluation and treatment.  At this time patient is at baseline and is awaiting for skilled nursing facility.    Assessment/Plan: Principal Problem:   Complicated UTI (urinary tract infection) Active Problems:   Acute metabolic encephalopathy   Gastroesophageal reflux disease without esophagitis   Moderate episode of recurrent major depressive disorder (HCC)   Essential hypertension   Chemotherapy-induced neuropathy (HCC)   Obesity, Class III, BMI 40-49.9 (morbid obesity)   Morbid obesity (HCC)   Malignant neoplasm of female breast (HCC)   Debility   Pain   Acute metabolic encephalopathy secondary to E. coli UTI. Likely at baseline at this time.Monica Mccoy  Has completed course of antibiotic. CT head was negative.  Awaiting skilled nursing facility  placement   Essential Hypertension Continue Norvasc  and Lasix     Depression Continue Effexor .   Hypokalemia Potassium of 3.1 on 04/15/2024.  Will replace with 40 mEq twice daily.  Check BMP in AM.    Chronic diastolic CHF/pulmonary hypertension Continue Lasix .    History of urinary retention Continue Myrbetriq    History of breast cancer Outpatient follow-up recommended   History of seizure disorder -Continue home regimen Depakote .   History of chemotherapy-induced neuropathy Continue gabapentin  as prescribed by palliative care.   GERD Continue PPI   Obesity class III Body mass index is 56.08 kg/m.   patient is largely bedbound.  Deconditioning debility.   Awaiting for skilled nursing facility placement.  TOC on board.   DVT prophylaxis: Lovenox    Disposition: Skilled nursing facility.  Medically stable for disposition  Status is: Inpatient Remains inpatient appropriate because: Awaiting for skilled nursing facility placement    Code Status:     Code Status: Full Code  Family Communication: None at bedside  Consultants: Palliative care  Procedures: None  Anti-infectives:  None  Anti-infectives (From admission, onward)    Start     Dose/Rate Route Frequency Ordered Stop   04/08/24 0600  amoxicillin  (AMOXIL ) capsule 500 mg        500 mg Oral Every 8 hours 04/07/24 1135 04/08/24 2112   04/08/24 0000  amoxicillin  (AMOXIL ) 500 MG capsule        500 mg Oral 3 times daily 04/08/24 1443     04/04/24 1000  cefTRIAXone  (ROCEPHIN ) 2 g in sodium chloride  0.9 % 100 mL IVPB  Status:  Discontinued        2 g 200 mL/hr over 30 Minutes Intravenous Every 24 hours 04/03/24 1939 04/03/24 1941   04/04/24 1000  cefTRIAXone  (ROCEPHIN ) 2 g in sodium chloride  0.9 % 100 mL IVPB  Status:  Discontinued        2 g 200 mL/hr over 30 Minutes Intravenous Every 24 hours 04/03/24 1941 04/07/24 1135   04/03/24 1745  cefTRIAXone  (ROCEPHIN ) 1 g in sodium chloride  0.9 % 100 mL IVPB        1 g 200 mL/hr over 30 Minutes Intravenous  Once 04/03/24 1742 04/03/24 1825        Subjective: Today, patient was seen and examined at bedside.  States that she feels okay.  No issues reported by nursing staff.  Patient denies any pain, nausea, vomiting or fever.  Objective: Vitals:   04/16/24 0443 04/16/24 0922  BP: (!) 136/96 110/80  Pulse: 78   Resp: 18   Temp: 98.5 F (36.9 C)   SpO2: 96%     Intake/Output Summary (Last 24 hours) at 04/16/2024 0940 Last data filed at 04/16/2024 0257 Gross per 24 hour  Intake 120 ml  Output 550 ml  Net -430 ml   Filed Weights   04/03/24 1552  Weight: (!) 157.6 kg   Body mass index is 56.08 kg/m.   Physical  Exam: GENERAL: Patient is alert awake and Communicative.. Not in obvious distress.  Obese built HENT: No scleral pallor or icterus. Pupils equally reactive to light. Oral mucosa is moist NECK: is supple, no gross swelling noted. CHEST:. No crackles or wheezes.  Diminished breath sounds bilaterally. CVS: S1 and S2 heard, no murmur. Regular rate and rhythm.  ABDOMEN: Soft, nonspecific tenderness, obese, bowel sounds are present. EXTREMITIES tenderness on palpation of the lower extremities. CNS: Cranial nerves are intact.  Moves extremities. SKIN: warm and dry without rashes.  Data Review: I have personally reviewed the following laboratory data and studies,  CBC: Recent Labs  Lab 04/15/24 0426  WBC 3.0*  HGB 12.2  HCT 40.1  MCV 90.5  PLT 172   Basic Metabolic Panel: Recent Labs  Lab 04/15/24 0426  NA 136  K 3.1*  CL 100  CO2 28  GLUCOSE 98  BUN 8  CREATININE 0.54  CALCIUM  8.8*  MG 1.9   Liver Function Tests: No results for input(s): AST, ALT, ALKPHOS, BILITOT, PROT, ALBUMIN in the last 168 hours. No results for input(s): LIPASE, AMYLASE in the last 168 hours. No results for input(s): AMMONIA in the last 168 hours. Cardiac Enzymes: No results for input(s): CKTOTAL, CKMB, CKMBINDEX, TROPONINI in the last 168 hours. BNP (last 3 results) No results for input(s): BNP in the last 8760 hours.  ProBNP (last 3 results) No results for input(s): PROBNP in the last 8760 hours.  CBG: No results for input(s): GLUCAP in the last 168 hours. No results found for this or any previous visit (from the past 240 hours).   Studies: No results found.    Carie Kapuscinski, MD  [facility] 04/16/2024  If 7PM-7AM, please contact night-coverage

## 2024-04-17 DIAGNOSIS — N390 Urinary tract infection, site not specified: Secondary | ICD-10-CM | POA: Diagnosis not present

## 2024-04-17 LAB — CBC
HCT: 41.6 % (ref 36.0–46.0)
Hemoglobin: 12.4 g/dL (ref 12.0–15.0)
MCH: 27.8 pg (ref 26.0–34.0)
MCHC: 29.8 g/dL — ABNORMAL LOW (ref 30.0–36.0)
MCV: 93.3 fL (ref 80.0–100.0)
Platelets: 176 K/uL (ref 150–400)
RBC: 4.46 MIL/uL (ref 3.87–5.11)
RDW: 14.1 % (ref 11.5–15.5)
WBC: 2.9 K/uL — ABNORMAL LOW (ref 4.0–10.5)
nRBC: 0 % (ref 0.0–0.2)

## 2024-04-17 LAB — BASIC METABOLIC PANEL WITH GFR
Anion gap: 11 (ref 5–15)
BUN: 8 mg/dL (ref 6–20)
CO2: 26 mmol/L (ref 22–32)
Calcium: 8.8 mg/dL — ABNORMAL LOW (ref 8.9–10.3)
Chloride: 97 mmol/L — ABNORMAL LOW (ref 98–111)
Creatinine, Ser: 0.69 mg/dL (ref 0.44–1.00)
GFR, Estimated: >60 mL/min (ref >60)
Glucose, Bld: 95 mg/dL (ref 70–99)
Potassium: 3.8 mmol/L (ref 3.5–5.1)
Sodium: 134 mmol/L — ABNORMAL LOW (ref 135–145)

## 2024-04-17 MED ORDER — POTASSIUM CHLORIDE CRYS ER 20 MEQ PO TBCR
20.0000 meq | EXTENDED_RELEASE_TABLET | Freq: Every day | ORAL | Status: DC
  Administered 2024-04-17 – 2024-04-21 (×5): 20 meq via ORAL
  Filled 2024-04-17 (×5): qty 1

## 2024-04-17 MED ORDER — POTASSIUM CHLORIDE CRYS ER 20 MEQ PO TBCR: 20.0000 meq | EXTENDED_RELEASE_TABLET | Freq: Every day | ORAL | Status: DC

## 2024-04-17 NOTE — Progress Notes (Signed)
 Physical Therapy Treatment Patient Details Name: Monica Mccoy MRN: [id number] DOB: [birth year] Today's Date: 04/17/2024   History of Present Illness Pt is 58 yo female admitted on 04/03/24 with complicated UTI and acute metabolic encephalopathy. Pt had CT head with no acute findings.  Pt does have breast CA with brain mets with hx of brain surgery and masectomy that is apparently in remission.  Other hx including but not limited to cognitive impairment, morbid obesity, HTN, CHF, seizure, GERD, and anxiety.    PT Comments  Pt NOT progressing Pt was able to stand at bedside last week.  Currently requires Total Assist for all bed mobility and used LIFT to get Pt OOB. Assisted with positioning Pt fully upright so she could self feed lunch.  B elbows propt up with pillows, all items opened and in close proximity.  Required hand over hand assist to grasp spoon.  Required hand over hand assist to scoop and bring spoon to mouth.  Pt unable to hold her cup of walker.  Pt mostly liftless and sleepy. LPT has rec SNF.    If plan is discharge home, recommend the following: Two people to help with bathing/dressing/bathroom;Two people to help with walking and/or transfers;Assistance with cooking/housework;Assist for transportation;Help with stairs or ramp for entrance;Supervision due to cognitive status;Assistance with feeding   Can travel by private vehicle     No  Equipment Recommendations  Wheelchair cushion (measurements PT);Wheelchair (measurements PT);Rolling walker (2 wheels);BSC/3in1;Hospital bed    Recommendations for Other Services       Precautions / Restrictions Precautions Precautions: Fall Recall of Precautions/Restrictions: Impaired Precaution/Restrictions Comments: AxO x 1 pleasant with few words and requiring repeat commands to stay on task.  Easily drifts off to sleep.  Poor memory. Restrictions Weight Bearing Restrictions Per Provider Order: No     Mobility  Bed  Mobility Overal bed mobility: Needs Assistance Bed Mobility: Rolling Rolling: Max assist, Total assist, +2 for physical assistance, +2 for safety/equipment, Used rails         General bed mobility comments: assisted with side to side rolling several times for hygiene and placement of MaxiMove Pad.    Transfers Overall transfer level: Needs assistance                 General transfer comment: used Maxi Sky LIFT to transfer Pt from bed to recliner + 2 assist. Transfer via Lift Equipment: [employer]  Ambulation/Gait                   Stairs             Wheelchair Mobility     Tilt Bed    Modified Rankin (Stroke Patients Only)       Balance                                            Communication Communication Communication: Impaired Factors Affecting Communication: Difficulty expressing self;Reduced clarity of speech  Cognition Arousal: Alert, Lethargic Behavior During Therapy: Flat affect   PT - Cognitive impairments: History of cognitive impairments, No family/caregiver present to determine baseline, Orientation, Problem solving, Safety/Judgement, Awareness                       PT - Cognition Comments: AxO x 1 responds to name and makes good eye contact.  Few words.  Pleasant.  requiring repeat  VC's to stay on task. Following commands: Impaired      Cueing Cueing Techniques: Verbal cues, Gestural cues, Visual cues, Tactile cues  Exercises      General Comments        Pertinent Vitals/Pain Pain Assessment Pain Assessment: No/denies pain    Home Living                          Prior Function            PT Goals (current goals can now be found in the care plan section) Progress towards PT goals: Progressing toward goals    Frequency    Min 2X/week      PT Plan      Co-evaluation              AM-PAC PT 6 Clicks Mobility   Outcome Measure  Help needed turning from your back  to your side while in a flat bed without using bedrails?: Total Help needed moving from lying on your back to sitting on the side of a flat bed without using bedrails?: Total Help needed moving to and from a bed to a chair (including a wheelchair)?: Total Help needed standing up from a chair using your arms (e.g., wheelchair or bedside chair)?: Total Help needed to walk in hospital room?: Total Help needed climbing 3-5 steps with a railing? : Total 6 Click Score: 6    End of Session Equipment Utilized During Treatment: Gait belt Activity Tolerance: Patient limited by fatigue Patient left: in chair Nurse Communication: Mobility status;Need for lift equipment PT Visit Diagnosis: Other abnormalities of gait and mobility (R26.89);Muscle weakness (generalized) (M62.81)     Time: 8797-8753 PT Time Calculation (min) (ACUTE ONLY): 44 min  Charges:    $Therapeutic Activity: 38-52 mins PT General Charges $$ ACUTE PT VISIT: 1 Visit                     Katheryn Leap  PTA Acute  Rehabilitation Services Office M-F          [phone]

## 2024-04-17 NOTE — Progress Notes (Signed)
 PROGRESS NOTE  Monica Mccoy FMW:[id number] DOB: 10/25/1965 DOA: 04/03/2024 PCP: Pcp, No   LOS: 13 days   Brief narrative:  58 y.o. female with past medical history significant for breast cancer with brain mets, apparently in remission, cognitive impairment, morbid obesity, hypertension, chronic diastolic CHF presented to hospital with increased confusion for 1 to 2 weeks with increased urinary episodes and foul-smelling urine.  Patient was then admitted hospital for further evaluation and treatment.  At this time patient is at baseline and is awaiting for skilled nursing facility.    Assessment/Plan: Principal Problem:   Complicated UTI (urinary tract infection) Active Problems:   Acute metabolic encephalopathy   Gastroesophageal reflux disease without esophagitis   Moderate episode of recurrent major depressive disorder (HCC)   Essential hypertension   Chemotherapy-induced neuropathy (HCC)   Obesity, Class III, BMI 40-49.9 (morbid obesity)   Morbid obesity (HCC)   Malignant neoplasm of female breast (HCC)   Debility   Pain   Acute metabolic encephalopathy secondary to E. coli UTI. Likely at baseline at this time.[name]  Has completed course of antibiotic. CT head was negative.  Awaiting skilled nursing facility  placement.  Has impaired cognition at baseline.   Essential Hypertension Continue Norvasc  and Lasix .  Blood pressure seems to be stable    Depression Continue Effexor .   Hypokalemia Improved potassium levels to 3.8 today.  Will add 20 mEq daily.    Chronic diastolic CHF/pulmonary hypertension Continue Lasix .  Currently on 20 mg every day.    History of urinary retention Continue Myrbetriq    History of breast cancer Outpatient follow-up recommended   History of seizure disorder -Continue home regimen Depakote .   History of chemotherapy-induced neuropathy Continue gabapentin .   GERD Continue PPI   Obesity class III Body mass index is 56.08 kg/m.   patient  is largely bedbound.  Deconditioning debility.  Awaiting for skilled nursing facility placement.  TOC on board.   DVT prophylaxis: Lovenox    Disposition: Skilled nursing facility.  Medically stable for disposition  Status is: Inpatient Remains inpatient appropriate because: Awaiting for skilled nursing facility placement, TOC on board, pending bed offer,   Code Status:     Code Status: Full Code  Family Communication: None at bedside  Consultants: Palliative care  Procedures: None  Anti-infectives:  None  Anti-infectives (From admission, onward)    Start     Dose/Rate Route Frequency Ordered Stop   04/08/24 0600  amoxicillin  (AMOXIL ) capsule 500 mg        500 mg Oral Every 8 hours 04/07/24 1135 04/08/24 2112   04/08/24 0000  amoxicillin  (AMOXIL ) 500 MG capsule        500 mg Oral 3 times daily 04/08/24 1443     04/04/24 1000  cefTRIAXone  (ROCEPHIN ) 2 g in sodium chloride  0.9 % 100 mL IVPB  Status:  Discontinued        2 g 200 mL/hr over 30 Minutes Intravenous Every 24 hours 04/03/24 1939 04/03/24 1941   04/04/24 1000  cefTRIAXone  (ROCEPHIN ) 2 g in sodium chloride  0.9 % 100 mL IVPB  Status:  Discontinued        2 g 200 mL/hr over 30 Minutes Intravenous Every 24 hours 04/03/24 1941 04/07/24 1135   04/03/24 1745  cefTRIAXone  (ROCEPHIN ) 1 g in sodium chloride  0.9 % 100 mL IVPB        1 g 200 mL/hr over 30 Minutes Intravenous  Once 04/03/24 1742 04/03/24 1825       Subjective: Today, patient  was seen and examined at bedside.  Feels sleepy.  Denies any headache nausea vomiting fever or chills.  Objective: Vitals:   04/16/24 1938 04/17/24 0555  BP: 109/65 106/78  Pulse: 87 73  Resp: 20 16  Temp: 98.2 F (36.8 C) 98 F (36.7 C)  SpO2: 95% 95%    Intake/Output Summary (Last 24 hours) at 04/17/2024 0908 Last data filed at 04/17/2024 0210 Gross per 24 hour  Intake --  Output 1000 ml  Net -1000 ml   Filed Weights   04/03/24 1552  Weight: (!) 157.6 kg   Body  mass index is 56.08 kg/m.   Physical Exam:  General: Obese built, not in obvious distress, alert awake and Communicative. HENT:   No scleral pallor or icterus noted. Oral mucosa is moist.  Chest:  Diminished breath sounds bilaterally. No crackles or wheezes.  CVS: S1 &S2 heard. No murmur.  Regular rate and rhythm. Abdomen: Soft, nontender, nondistended.  Bowel sounds are heard.   Extremities: No cyanosis, clubbing or edema.  Tenderness on palpation.  Nonspecific. Psych: Alert, awake Communicative.  Impaired cognition. CNS:  No cranial nerve deficits.  Moves extremities. Skin: Warm and dry.  No rashes noted.  Data Review: I have personally reviewed the following laboratory data and studies,  CBC: Recent Labs  Lab 04/15/24 0426 04/17/24 0422  WBC 3.0* 2.9*  HGB 12.2 12.4  HCT 40.1 41.6  MCV 90.5 93.3  PLT 172 176   Basic Metabolic Panel: Recent Labs  Lab 04/15/24 0426 04/17/24 0422  NA 136 134*  K 3.1* 3.8  CL 100 97*  CO2 28 26  GLUCOSE 98 95  BUN 8 8  CREATININE 0.54 0.69  CALCIUM  8.8* 8.8*  MG 1.9  --    Liver Function Tests: No results for input(s): AST, ALT, ALKPHOS, BILITOT, PROT, ALBUMIN in the last 168 hours. No results for input(s): LIPASE, AMYLASE in the last 168 hours. No results for input(s): AMMONIA in the last 168 hours. Cardiac Enzymes: No results for input(s): CKTOTAL, CKMB, CKMBINDEX, TROPONINI in the last 168 hours. BNP (last 3 results) No results for input(s): BNP in the last 8760 hours.  ProBNP (last 3 results) No results for input(s): PROBNP in the last 8760 hours.  CBG: No results for input(s): GLUCAP in the last 168 hours. No results found for this or any previous visit (from the past 240 hours).   Studies: No results found.    Vernal Alstrom, MD  [facility] 04/17/2024  If 7PM-7AM, please contact night-coverage

## 2024-04-17 NOTE — Plan of Care (Signed)
  Problem: Clinical Measurements: Goal: Ability to maintain clinical measurements within normal limits will improve Outcome: Progressing Goal: Diagnostic test results will improve Outcome: Progressing Goal: Respiratory complications will improve Outcome: Progressing Goal: Cardiovascular complication will be avoided Outcome: Progressing   Problem: Activity: Goal: Risk for activity intolerance will decrease Outcome: Progressing   Problem: Nutrition: Goal: Adequate nutrition will be maintained Outcome: Progressing   Problem: Coping: Goal: Level of anxiety will decrease Outcome: Progressing   Problem: Elimination: Goal: Will not experience complications related to bowel motility Outcome: Progressing Goal: Will not experience complications related to urinary retention Outcome: Progressing   Problem: Pain Managment: Goal: General experience of comfort will improve and/or be controlled Outcome: Progressing   Problem: Safety: Goal: Ability to remain free from injury will improve Outcome: Progressing   Problem: Skin Integrity: Goal: Risk for impaired skin integrity will decrease Outcome: Progressing

## 2024-04-17 NOTE — Plan of Care (Signed)

## 2024-04-17 NOTE — TOC Progression Note (Signed)
 Transition of Care [facility]) - Progression Note    Patient Details  Name: Monica Mccoy MRN: [id number] Date of Birth: 03-Feb-1966  [facility]) CM/SW Contact  Doneta Glenys DASEN, RN Phone Number: 04/17/2024, 4:26 PM  Clinical Narrative:    CM spoke with patients sister Patrecia. They have chosen [facility] in [place].  [facility] notified of patients acceptance of bed offer. [facility] will start insurance auth. TOC weekend coverage Tawni and Katrice telephone numbers added to comment to facility for communication. CM received chat from Delon Jacobus to call Nathanel JASMINE [facility] [phone] ext [phone]. Called left message.   Expected Discharge Plan: Skilled Nursing Facility Barriers to Discharge: SNF Pending bed offer, Inadequate or no insurance               Expected Discharge Plan and Services In-house Referral: NA Discharge Planning Services: CM Consult Post Acute Care Choice: NA Living arrangements for the past 2 months: Apartment Expected Discharge Date: 04/08/24               DME Arranged: N/A DME Agency: NA       HH Arranged: NA HH Agency: NA         Social Drivers of Health (SDOH) Interventions SDOH Screenings   Food Insecurity: Food Insecurity Present (04/03/2024)  Housing: Unknown (04/03/2024)  Transportation Needs: Unmet Transportation Needs (04/03/2024)  Utilities: At Risk (04/03/2024)  Alcohol Screen: Low Risk  (03/27/2022)  Depression (PHQ2-9): High Risk (05/21/2022)  Financial Resource Strain: Low Risk  (03/27/2022)  Physical Activity: Insufficiently Active (03/27/2022)  Social Connections: Feeling Socially Integrated (01/11/2023)   Received from [facility]  Stress: No Stress Concern Present (03/27/2022)  Tobacco Use: Low Risk  (04/03/2024)    Readmission Risk Interventions    08/19/2023   11:05 AM 06/11/2022   11:48 AM 06/10/2022   10:14 AM  Readmission Risk Prevention Plan  Post Dischage Appt  Complete    Medication Screening Complete    Transportation Screening Complete Complete Complete  PCP or Specialist Appt within 5-7 Days  Complete Complete  Home Care Screening  Complete Complete  Medication Review (RN CM)  Complete Complete

## 2024-04-18 DIAGNOSIS — N390 Urinary tract infection, site not specified: Secondary | ICD-10-CM | POA: Diagnosis not present

## 2024-04-18 NOTE — Progress Notes (Signed)
 PROGRESS NOTE  Monica Mccoy FMW:[id number] DOB: 08-Sep-1965 DOA: 04/03/2024 PCP: Pcp, No   LOS: 14 days   Brief narrative:  58 y.o. female with past medical history significant for breast cancer with brain mets, apparently in remission, cognitive impairment, morbid obesity, hypertension, chronic diastolic CHF presented to hospital with increased confusion for 1 to 2 weeks with increased urinary episodes and foul-smelling urine.  Patient was then admitted hospital for further evaluation and treatment.  At this time, patient is at baseline and is awaiting for skilled nursing facility.    Assessment/Plan: Principal Problem:   Complicated UTI (urinary tract infection) Active Problems:   Acute metabolic encephalopathy   Gastroesophageal reflux disease without esophagitis   Moderate episode of recurrent major depressive disorder (HCC)   Essential hypertension   Chemotherapy-induced neuropathy (HCC)   Obesity, Class III, BMI 40-49.9 (morbid obesity)   Morbid obesity (HCC)   Malignant neoplasm of female breast (HCC)   Debility   Pain   Acute metabolic encephalopathy secondary to E. coli UTI. Likely at baseline at this time.[name]  Has completed course of antibiotic. CT head was negative.  Awaiting skilled nursing facility  placement.  Has impaired cognition at baseline.  Has confusion at baseline oriented to self.   Essential Hypertension Continue Norvasc  and Lasix .  Blood pressure seems to be stable    Depression Continue Effexor .   Hypokalemia Improved after replacement.  Latest potassium of 3.8    Chronic diastolic CHF/pulmonary hypertension Continue Lasix .  Currently on 20 mg every day.    History of urinary retention Continue Myrbetriq    History of breast cancer Outpatient follow-up recommended   History of seizure disorder -Continue home regimen Depakote .   History of chemotherapy-induced neuropathy Continue gabapentin .   GERD Continue PPI   Obesity class III Body  mass index is 56.08 kg/m.   patient is largely bedbound.  Deconditioning debility.  Awaiting for skilled nursing facility placement.  TOC on board.   DVT prophylaxis: Lovenox    Disposition: Skilled nursing facility.  Medically stable for disposition  Status is: Inpatient Remains inpatient appropriate because: Awaiting for skilled nursing facility placement, TOC on board, pending bed offer,   Code Status:     Code Status: Full Code  Family Communication: None at bedside  Consultants: Palliative care  Procedures: None  Anti-infectives:  None  Anti-infectives (From admission, onward)    Start     Dose/Rate Route Frequency Ordered Stop   04/08/24 0600  amoxicillin  (AMOXIL ) capsule 500 mg        500 mg Oral Every 8 hours 04/07/24 1135 04/08/24 2112   04/08/24 0000  amoxicillin  (AMOXIL ) 500 MG capsule        500 mg Oral 3 times daily 04/08/24 1443     04/04/24 1000  cefTRIAXone  (ROCEPHIN ) 2 g in sodium chloride  0.9 % 100 mL IVPB  Status:  Discontinued        2 g 200 mL/hr over 30 Minutes Intravenous Every 24 hours 04/03/24 1939 04/03/24 1941   04/04/24 1000  cefTRIAXone  (ROCEPHIN ) 2 g in sodium chloride  0.9 % 100 mL IVPB  Status:  Discontinued        2 g 200 mL/hr over 30 Minutes Intravenous Every 24 hours 04/03/24 1941 04/07/24 1135   04/03/24 1745  cefTRIAXone  (ROCEPHIN ) 1 g in sodium chloride  0.9 % 100 mL IVPB        1 g 200 mL/hr over 30 Minutes Intravenous  Once 04/03/24 1742 04/03/24 1825  Subjective: Today, patient was seen and examined at bedside.  Patient states that she feels okay no pain no shortness of breath dyspnea.  Objective: Vitals:   04/18/24 0524 04/18/24 1126  BP: (!) 136/92 119/87  Pulse: 76 75  Resp: 18 18  Temp: 98.6 F (37 C) 98.7 F (37.1 C)  SpO2: 94% 93%    Intake/Output Summary (Last 24 hours) at 04/18/2024 1533 Last data filed at 04/18/2024 1133 Gross per 24 hour  Intake --  Output 1475 ml  Net -1475 ml   Filed Weights    04/03/24 1552  Weight: (!) 157.6 kg   Body mass index is 56.08 kg/m.   Physical Exam:  General: Obese built, not in obvious distress, alert awake and Communicative. HENT:   No scleral pallor or icterus noted. Oral mucosa is moist.  Chest:  Diminished breath sounds bilaterally. No crackles or wheezes.  CVS: S1 &S2 heard. No murmur.  Regular rate and rhythm. Abdomen: Soft, nontender, nondistended.  Bowel sounds are heard.   Extremities: No cyanosis, clubbing or edema.  Tenderness on palpation.  Nonspecific. Psych: Alert, awake Communicative.  Impaired cognition. CNS:  No cranial nerve deficits.  Moves extremities. Skin: Warm and dry.  No rashes noted.  Data Review: I have personally reviewed the following laboratory data and studies,  CBC: Recent Labs  Lab 04/15/24 0426 04/17/24 0422  WBC 3.0* 2.9*  HGB 12.2 12.4  HCT 40.1 41.6  MCV 90.5 93.3  PLT 172 176   Basic Metabolic Panel: Recent Labs  Lab 04/15/24 0426 04/17/24 0422  NA 136 134*  K 3.1* 3.8  CL 100 97*  CO2 28 26  GLUCOSE 98 95  BUN 8 8  CREATININE 0.54 0.69  CALCIUM  8.8* 8.8*  MG 1.9  --    Liver Function Tests: No results for input(s): AST, ALT, ALKPHOS, BILITOT, PROT, ALBUMIN in the last 168 hours. No results for input(s): LIPASE, AMYLASE in the last 168 hours. No results for input(s): AMMONIA in the last 168 hours. Cardiac Enzymes: No results for input(s): CKTOTAL, CKMB, CKMBINDEX, TROPONINI in the last 168 hours. BNP (last 3 results) No results for input(s): BNP in the last 8760 hours.  ProBNP (last 3 results) No results for input(s): PROBNP in the last 8760 hours.  CBG: No results for input(s): GLUCAP in the last 168 hours. No results found for this or any previous visit (from the past 240 hours).   Studies: No results found.    Vernal Alstrom, MD  [facility] 04/18/2024  If 7PM-7AM, please contact night-coverage

## 2024-04-18 NOTE — Plan of Care (Signed)
 Patient noted confusion alert to person. Patient noted to ramble, denies pain very pleasant laughing. Patient ate snack took meds turned and positioned Q2H as per protocol to prevent further breakdown. Patient had bowel movement washed with lien change open area noted to buttock with red areas to groin and abdominal folds. Patient bed in lowest position education provided with reinforcement will continue to monitor and provide support.

## 2024-04-18 NOTE — Plan of Care (Signed)

## 2024-04-19 DIAGNOSIS — N390 Urinary tract infection, site not specified: Secondary | ICD-10-CM | POA: Diagnosis not present

## 2024-04-19 NOTE — Progress Notes (Signed)
 PROGRESS NOTE  Monica Mccoy FMW:[id number] DOB: [birth year] DOA: 04/03/2024 PCP: Pcp, No   LOS: 15 days   Brief narrative:  58 y.o. female with past medical history significant for breast cancer with brain mets, apparently in remission, cognitive impairment, morbid obesity, hypertension, chronic diastolic CHF presented to hospital with increased confusion for 1 to 2 weeks with increased urinary episodes and foul-smelling urine.  Patient was then admitted hospital for further evaluation and treatment.  At this time, patient is at baseline and is awaiting for skilled nursing facility.    Assessment/Plan: Principal Problem:   Complicated UTI (urinary tract infection) Active Problems:   Acute metabolic encephalopathy   Gastroesophageal reflux disease without esophagitis   Moderate episode of recurrent major depressive disorder (HCC)   Essential hypertension   Chemotherapy-induced neuropathy (HCC)   Obesity, Class III, BMI 40-49.9 (morbid obesity)   Morbid obesity (HCC)   Malignant neoplasm of female breast (HCC)   Debility   Pain   Acute metabolic encephalopathy secondary to E. coli UTI. Likely at baseline at this time.[name]  Has completed course of antibiotic. CT head was negative.  Awaiting skilled nursing facility  placement.  Has impaired cognition at baseline.  Has confusion at baseline oriented to self.   Essential Hypertension Continue Norvasc  and Lasix .  Blood pressure seems to be stable    Depression Continue Effexor .   Hypokalemia Improved after replacement.  Latest potassium of 3.8    Chronic diastolic CHF/pulmonary hypertension Continue Lasix  orally 20 mg daily.    History of urinary retention Continue Myrbetriq    History of breast cancer Outpatient follow-up recommended   History of seizure disorder -Continue home regimen Depakote .   History of chemotherapy-induced neuropathy Continue gabapentin .   GERD Continue PPI   Obesity class III Body mass index is  56.08 kg/m.   patient is largely bedbound.  Deconditioning debility.  Awaiting for skilled nursing facility placement.  TOC on board.   DVT prophylaxis: Lovenox  subcu   Disposition: Skilled nursing facility.  Medically stable for disposition  Status is: Inpatient Remains inpatient appropriate because: Awaiting for skilled nursing facility placement, TOC on board  Code Status:     Code Status: Full Code  Family Communication: None at bedside  Consultants: Palliative care  Procedures: None  Anti-infectives:  None  Anti-infectives (From admission, onward)    Start     Dose/Rate Route Frequency Ordered Stop   04/08/24 0600  amoxicillin  (AMOXIL ) capsule 500 mg        500 mg Oral Every 8 hours 04/07/24 1135 04/08/24 2112   04/08/24 0000  amoxicillin  (AMOXIL ) 500 MG capsule        500 mg Oral 3 times daily 04/08/24 1443     04/04/24 1000  cefTRIAXone  (ROCEPHIN ) 2 g in sodium chloride  0.9 % 100 mL IVPB  Status:  Discontinued        2 g 200 mL/hr over 30 Minutes Intravenous Every 24 hours 04/03/24 1939 04/03/24 1941   04/04/24 1000  cefTRIAXone  (ROCEPHIN ) 2 g in sodium chloride  0.9 % 100 mL IVPB  Status:  Discontinued        2 g 200 mL/hr over 30 Minutes Intravenous Every 24 hours 04/03/24 1941 04/07/24 1135   04/03/24 1745  cefTRIAXone  (ROCEPHIN ) 1 g in sodium chloride  0.9 % 100 mL IVPB        1 g 200 mL/hr over 30 Minutes Intravenous  Once 04/03/24 1742 04/03/24 1825       Subjective: Today, patient was seen  and examined at bedside.  Patient denies interval complaints.  Poor historian.  Denies any nausea vomiting shortness of breath or dyspnea.  Complains of some pain.    Objective: Vitals:   04/18/24 1956 04/19/24 0602  BP: 129/72 (!) 126/95  Pulse: 78 71  Resp: 18 18  Temp: 98 F (36.7 C) 98 F (36.7 C)  SpO2: 97% 97%    Intake/Output Summary (Last 24 hours) at 04/19/2024 0927 Last data filed at 04/19/2024 0500 Gross per 24 hour  Intake 958 ml  Output 2000  ml  Net -1042 ml   Filed Weights   04/03/24 1552  Weight: (!) 157.6 kg   Body mass index is 56.08 kg/m.   Physical Exam:  General: Obese built, not in obvious distress HENT:   No scleral pallor or icterus noted. Oral mucosa is moist.  Chest:  Diminished breath sounds bilaterally. No crackles or wheezes.  CVS: S1 &S2 heard. No murmur.  Regular rate and rhythm. Abdomen: Soft,  nondistended.  Nonspecific tenderness on palpation bowel sounds are heard.   Extremities: No cyanosis, clubbing or edema.  Peripheral pulses are palpable. Psych: Alert, awake and oriented to self, impaired cognition. CNS:  No cranial nerve deficits.  Moves extremities Skin: Warm and dry.    Data Review: I have personally reviewed the following laboratory data and studies,  CBC: Recent Labs  Lab 04/15/24 0426 04/17/24 0422  WBC 3.0* 2.9*  HGB 12.2 12.4  HCT 40.1 41.6  MCV 90.5 93.3  PLT 172 176   Basic Metabolic Panel: Recent Labs  Lab 04/15/24 0426 04/17/24 0422  NA 136 134*  K 3.1* 3.8  CL 100 97*  CO2 28 26  GLUCOSE 98 95  BUN 8 8  CREATININE 0.54 0.69  CALCIUM  8.8* 8.8*  MG 1.9  --    Liver Function Tests: No results for input(s): AST, ALT, ALKPHOS, BILITOT, PROT, ALBUMIN in the last 168 hours. No results for input(s): LIPASE, AMYLASE in the last 168 hours. No results for input(s): AMMONIA in the last 168 hours. Cardiac Enzymes: No results for input(s): CKTOTAL, CKMB, CKMBINDEX, TROPONINI in the last 168 hours. BNP (last 3 results) No results for input(s): BNP in the last 8760 hours.  ProBNP (last 3 results) No results for input(s): PROBNP in the last 8760 hours.  CBG: No results for input(s): GLUCAP in the last 168 hours. No results found for this or any previous visit (from the past 240 hours).   Studies: No results found.    Banesa Tristan, MD  [facility] 04/19/2024  If 7PM-7AM, please contact night-coverage

## 2024-04-19 NOTE — Plan of Care (Signed)

## 2024-04-20 DIAGNOSIS — N390 Urinary tract infection, site not specified: Secondary | ICD-10-CM | POA: Diagnosis not present

## 2024-04-20 NOTE — Progress Notes (Signed)
 PROGRESS NOTE  Monica Mccoy FMW:[id number] DOB: 01-Aug-1966 DOA: 04/03/2024 PCP: Pcp, No   LOS: 16 days   Brief narrative:  58 y.o. female with past medical history significant for breast cancer with brain mets, apparently in remission, cognitive impairment, morbid obesity, hypertension, chronic diastolic CHF presented to hospital with increased confusion for 1 to 2 weeks with increased urinary episodes and foul-smelling urine.  Patient was then admitted hospital for further evaluation and treatment.  At this time, patient is at baseline and is awaiting for skilled nursing facility.    Assessment/Plan: Principal Problem:   Complicated UTI (urinary tract infection) Active Problems:   Acute metabolic encephalopathy   Gastroesophageal reflux disease without esophagitis   Moderate episode of recurrent major depressive disorder (HCC)   Essential hypertension   Chemotherapy-induced neuropathy (HCC)   Obesity, Class III, BMI 40-49.9 (morbid obesity)   Morbid obesity (HCC)   Malignant neoplasm of female breast (HCC)   Debility   Pain   Acute metabolic encephalopathy secondary to E. coli UTI. Likely at baseline at this time.Monica Mccoy  Has completed course of antibiotic. CT head was negative.  Has impaired cognition at baseline with some confusion.   Essential Hypertension Continue Norvasc  and Lasix .     Depression Continue Effexor .   Hypokalemia Improved after replacement.  Latest potassium of 3.8.  Will repeat labs tomorrow    Chronic diastolic CHF/pulmonary hypertension Continue Lasix  orally 20 mg daily.    History of urinary retention Continue Myrbetriq    History of breast cancer Outpatient follow-up recommended   History of seizure disorder -Continue home regimen Depakote .   History of chemotherapy-induced neuropathy Continue gabapentin .   GERD Continue PPI   Obesity class III Body mass index is 56.08 kg/m.   patient is largely bedbound.  Deconditioning debility.   Awaiting for skilled nursing facility placement.  TOC on board.   DVT prophylaxis: Lovenox  subcu   Disposition: Skilled nursing facility.  Medically stable for disposition  Status is: Inpatient Remains inpatient appropriate because: Awaiting for skilled nursing facility placement, TOC on board  Code Status:     Code Status: Full Code  Family Communication: None at bedside  Consultants: Palliative care  Procedures: None  Anti-infectives:  None  Anti-infectives (From admission, onward)    Start     Dose/Rate Route Frequency Ordered Stop   04/08/24 0600  amoxicillin  (AMOXIL ) capsule 500 mg        500 mg Oral Every 8 hours 04/07/24 1135 04/08/24 2112   04/08/24 0000  amoxicillin  (AMOXIL ) 500 MG capsule        500 mg Oral 3 times daily 04/08/24 1443     04/04/24 1000  cefTRIAXone  (ROCEPHIN ) 2 g in sodium chloride  0.9 % 100 mL IVPB  Status:  Discontinued        2 g 200 mL/hr over 30 Minutes Intravenous Every 24 hours 04/03/24 1939 04/03/24 1941   04/04/24 1000  cefTRIAXone  (ROCEPHIN ) 2 g in sodium chloride  0.9 % 100 mL IVPB  Status:  Discontinued        2 g 200 mL/hr over 30 Minutes Intravenous Every 24 hours 04/03/24 1941 04/07/24 1135   04/03/24 1745  cefTRIAXone  (ROCEPHIN ) 1 g in sodium chloride  0.9 % 100 mL IVPB        1 g 200 mL/hr over 30 Minutes Intravenous  Once 04/03/24 1742 04/03/24 1825       Subjective: Today, patient was seen and examined at bedside.  Patient did has any pain nausea vomiting fever  or chills.  Poor historian.  Vitals:   04/19/24 1949 04/20/24 0523  BP: 123/85 (!) 120/106  Pulse: 80 79  Resp:  18  Temp: 97.8 F (36.6 C) 98.6 F (37 C)  SpO2: 99% 95%    Intake/Output Summary (Last 24 hours) at 04/20/2024 0901 Last data filed at 04/19/2024 1900 Gross per 24 hour  Intake 960 ml  Output 725 ml  Net 235 ml   Filed Weights   04/03/24 1552  Weight: (!) 157.6 kg   Body mass index is 56.08 kg/m.   Physical Exam:  General: Obese  built, not in obvious distress HENT:   No scleral pallor or icterus noted. Oral mucosa is moist.  Chest:  Diminished breath sounds bilaterally.  CVS: S1 &S2 heard. No murmur.  Regular rate and rhythm. Abdomen: Soft,  nondistended.  Nonspecific tenderness on palpation bowel sounds are heard.   Extremities: No cyanosis, clubbing or edema.  Peripheral pulses are palpable. Psych: Alert, awake and oriented to self, impaired cognition. CNS:  No cranial nerve deficits.  Moves extremities Skin: Warm and dry.    Data Review: I have personally reviewed the following laboratory data and studies,  CBC: Recent Labs  Lab 04/15/24 0426 04/17/24 0422  WBC 3.0* 2.9*  HGB 12.2 12.4  HCT 40.1 41.6  MCV 90.5 93.3  PLT 172 176   Basic Metabolic Panel: Recent Labs  Lab 04/15/24 0426 04/17/24 0422  NA 136 134*  K 3.1* 3.8  CL 100 97*  CO2 28 26  GLUCOSE 98 95  BUN 8 8  CREATININE 0.54 0.69  CALCIUM  8.8* 8.8*  MG 1.9  --    Liver Function Tests: No results for input(s): AST, ALT, ALKPHOS, BILITOT, PROT, ALBUMIN in the last 168 hours. No results for input(s): LIPASE, AMYLASE in the last 168 hours. No results for input(s): AMMONIA in the last 168 hours. Cardiac Enzymes: No results for input(s): CKTOTAL, CKMB, CKMBINDEX, TROPONINI in the last 168 hours. BNP (last 3 results) No results for input(s): BNP in the last 8760 hours.  ProBNP (last 3 results) No results for input(s): PROBNP in the last 8760 hours.  CBG: No results for input(s): GLUCAP in the last 168 hours. No results found for this or any previous visit (from the past 240 hours).   Studies: No results found.    [name], MD  [facility] 04/20/2024  If 7PM-7AM, please contact night-coverage

## 2024-04-20 NOTE — Progress Notes (Signed)
 PMT brief progress note.  Patient is resting comfortably in bed.  No distress   Chart reviewed.  BP 114/82 (BP Location: Left Arm)   Pulse 88   Temp 98.3 F (36.8 C) (Oral)   Resp 19   Ht 5' 6 (1.676 m)   Wt (!) 157.6 kg   SpO2 96%   BMI 56.08 kg/m  Labs and imaging noted.  TRH MD note and TOC note reviewed.  Awaiting SNF discharge: latest TOC note reviewed.  Patient is already connected with palliative care at [facility], sees my colleague Ms Cousar NP Recommend ongoing outpatient palliative for GOC discussions.  Low MDM Lonia Serve MD [facility].

## 2024-04-20 NOTE — TOC Progression Note (Addendum)
 Transition of Care [facility]) - Progression Note    Patient Details  Name: Monica Mccoy MRN: [id number] Date of Birth: [birth year]  Transition of Care [facility]) CM/SW Contact  Doneta Glenys DASEN, RN Phone Number: 04/20/2024, 9:46 AM  Clinical Narrative:    CM called [facility] [place] for insurance update. [place] will check and call CM back. 3:49 PM No update on insurance auth.  Expected Discharge Plan: Skilled Nursing Facility Barriers to Discharge: SNF Pending bed offer, Inadequate or no insurance               Expected Discharge Plan and Services In-house Referral: NA Discharge Planning Services: CM Consult Post Acute Care Choice: NA Living arrangements for the past 2 months: Apartment Expected Discharge Date: 04/08/24               DME Arranged: N/A DME Agency: NA       HH Arranged: NA HH Agency: NA         Social Drivers of Health (SDOH) Interventions SDOH Screenings   Food Insecurity: Food Insecurity Present (04/03/2024)  Housing: Unknown (04/03/2024)  Transportation Needs: Unmet Transportation Needs (04/03/2024)  Utilities: At Risk (04/03/2024)  Alcohol Screen: Low Risk  (03/27/2022)  Depression (PHQ2-9): High Risk (05/21/2022)  Financial Resource Strain: Low Risk  (03/27/2022)  Physical Activity: Insufficiently Active (03/27/2022)  Social Connections: Feeling Socially Integrated (01/11/2023)   Received from [facility]  Stress: No Stress Concern Present (03/27/2022)  Tobacco Use: Low Risk  (04/03/2024)    Readmission Risk Interventions    08/19/2023   11:05 AM 06/11/2022   11:48 AM 06/10/2022   10:14 AM  Readmission Risk Prevention Plan  Post Dischage Appt Complete    Medication Screening Complete    Transportation Screening Complete Complete Complete  PCP or Specialist Appt within 5-7 Days  Complete Complete  Home Care Screening  Complete Complete  Medication Review (RN CM)  Complete Complete

## 2024-04-21 DIAGNOSIS — N390 Urinary tract infection, site not specified: Secondary | ICD-10-CM | POA: Diagnosis not present

## 2024-04-21 MED ORDER — POTASSIUM CHLORIDE CRYS ER 10 MEQ PO TBCR: 20.0000 meq | EXTENDED_RELEASE_TABLET | Freq: Every day | ORAL | Status: DC

## 2024-04-21 MED ORDER — AMLODIPINE BESYLATE 5 MG PO TABS: 5.0000 mg | ORAL_TABLET | Freq: Every day | ORAL | Status: AC

## 2024-04-21 MED ORDER — PANTOPRAZOLE SODIUM 40 MG PO TBEC: 40.0000 mg | DELAYED_RELEASE_TABLET | Freq: Every day | ORAL | Status: AC

## 2024-04-21 MED ORDER — THIAMINE HCL 100 MG PO TABS: 100.0000 mg | ORAL_TABLET | Freq: Every day | ORAL | Status: AC

## 2024-04-21 MED ORDER — MIRABEGRON ER 50 MG PO TB24: 50.0000 mg | ORAL_TABLET | Freq: Every day | ORAL | Status: DC

## 2024-04-21 MED ORDER — DIVALPROEX SODIUM 250 MG PO DR TAB: 250.0000 mg | DELAYED_RELEASE_TABLET | Freq: Two times a day (BID) | ORAL | Status: DC

## 2024-04-21 MED ORDER — VENLAFAXINE HCL ER 75 MG PO CP24: 75.0000 mg | ORAL_CAPSULE | Freq: Every day | ORAL | Status: DC

## 2024-04-21 MED ORDER — GABAPENTIN 300 MG PO CAPS
600.0000 mg | ORAL_CAPSULE | Freq: Two times a day (BID) | ORAL | Status: DC
Start: 2024-04-21 — End: 2024-07-17

## 2024-04-21 MED ORDER — TYLENOL 8 HOUR ARTHRITIS PAIN 650 MG PO TBCR: 650.0000 mg | EXTENDED_RELEASE_TABLET | Freq: Three times a day (TID) | ORAL | Status: AC | PRN

## 2024-04-21 MED ORDER — FUROSEMIDE 20 MG PO TABS: 20.0000 mg | ORAL_TABLET | Freq: Every morning | ORAL | Status: DC

## 2024-04-21 NOTE — TOC Transition Note (Addendum)
 Transition of Care [facility]) - Discharge Note   Patient Details  Name: Monica Mccoy MRN: [id number] Date of Birth: 1965-11-27  Transition of Care [facility]) CM/SW Contact:  Doneta Glenys DASEN, RN Phone Number: 04/21/2024, 11:43 AM   Clinical Narrative:    Per MD patient is medically ready for discharge to [facility] in [place]. The patient, patients family Vernia), SNF, and patients nurse are aware of discharge. The nurse will call report to [facility] [phone] room 113 prior to discharge. The discharge packet is on shadow chart and includes face sheet, medical necessity, and discharge summary. [facility] has been called. CM signing off.  Final next level of care: Skilled Nursing Facility Barriers to Discharge: Barriers Resolved   Patient Goals and CMS Choice   CMS Medicare.gov Compare Post Acute Care list provided to:: Patient Represenative (must comment) Vernia) Choice offered to / list presented to : [facility] POA / Guardian [place] ownership interest in [facility].provided to:: Parent NA    Discharge Placement   Existing PASRR number confirmed : 04/21/24          Patient chooses bed at:  [facility] in [place]) Patient to be transferred to facility by: [facility] Name of family member notified: [name] Patient and family notified of of transfer: 04/21/24  Discharge Plan and Services Additional resources added to the After Visit Summary for   In-house Referral: NA Discharge Planning Services: CM Consult Post Acute Care Choice: NA          DME Arranged: N/A DME Agency: NA       HH Arranged: NA HH Agency: NA        Social Drivers of Health (SDOH) Interventions SDOH Screenings   Food Insecurity: Food Insecurity Present (04/03/2024)  Housing: Unknown (04/03/2024)  Transportation Needs: Unmet Transportation Needs (04/03/2024)  Utilities: At Risk (04/03/2024)  Alcohol Screen: Low Risk  (03/27/2022)  Depression (PHQ2-9): High Risk (05/21/2022)  Financial  Resource Strain: Low Risk  (03/27/2022)  Physical Activity: Insufficiently Active (03/27/2022)  Social Connections: Feeling Socially Integrated (01/11/2023)   Received from [facility]  Stress: No Stress Concern Present (03/27/2022)  Tobacco Use: Low Risk  (04/03/2024)     Readmission Risk Interventions    08/19/2023   11:05 AM 06/11/2022   11:48 AM 06/10/2022   10:14 AM  Readmission Risk Prevention Plan  Post Dischage Appt Complete    Medication Screening Complete    Transportation Screening Complete Complete Complete  PCP or Specialist Appt within 5-7 Days  Complete Complete  Home Care Screening  Complete Complete  Medication Review (RN CM)  Complete Complete

## 2024-04-21 NOTE — Discharge Summary (Signed)
 Physician Discharge Summary  Monica Mccoy FMW:[id number] DOB: 16-Jul-1966 DOA: 04/03/2024  PCP: [place], No  Admit date: 04/03/2024 Discharge date: 04/21/2024  Admitted From: Home  Discharge disposition: Skilled nursing facility   Recommendations for Outpatient Follow-Up:   Follow up with your primary care provider at the skilled nursing facility in 3 to 5 days Check CBC, BMP, magnesium  in the next visit  Discharge Diagnosis:   Principal Problem:   Complicated UTI (urinary tract infection) Active Problems:   Acute metabolic encephalopathy   Gastroesophageal reflux disease without esophagitis   Moderate episode of recurrent major depressive disorder (HCC)   Essential hypertension   Chemotherapy-induced neuropathy (HCC)   Obesity, Class III, BMI 40-49.9 (morbid obesity)   Morbid obesity (HCC)   Malignant neoplasm of female breast (HCC)   Debility   Pain   Discharge Condition: Improved.  Diet recommendation: Low sodium, heart healthy.    Wound care: None.  Code status: Full.   History of Present Illness:   58 y.o. female with past medical history significant for breast cancer with brain mets, apparently in remission, cognitive impairment, morbid obesity, hypertension, chronic diastolic CHF presented to hospital with increased confusion for 1 to 2 weeks with increased urinary episodes and foul-smelling urine.  Patient was then admitted hospital for further evaluation and treatment.  Patient had a prolonged course during hospitalization largely for skilled nursing facility placement.   Hospital Course:   Following conditions were addressed during hospitalization as listed below,  Acute metabolic encephalopathy secondary to E. coli UTI. Likely at baseline at this time.Monica Mccoy  Has completed course of antibiotic. CT head was negative.  Has impaired cognition at baseline with some confusion.  Continue supportive care.   Essential Hypertension Continue Norvasc  and Lasix .      Depression Continue Effexor .   Hypokalemia Improved after replacement.  Latest potassium of 3.8.     Chronic diastolic CHF/pulmonary hypertension Continue Lasix  orally 20 mg daily.    History of urinary retention Continue Myrbetriq    History of breast cancer with brain mets Outpatient follow-up recommended   History of seizure disorder -Continue home regimen Depakote .   History of chemotherapy-induced neuropathy Continue gabapentin .   GERD Continue PPI   Obesity class III Body mass index is 56.08 kg/m.   patient is largely bedbound.   Deconditioning debility.  PT has recommended skilled nursing facility placement on discharge.   Disposition.  At this time, patient is stable for disposition to skilled nursing facility   Medical Consultants:   Palliative care   Procedures:    None Subjective:   Today, patient was seen and examined at bedside.  Denies interval complaints.  Poor historian.  Discharge Exam:   Vitals:   04/20/24 1925 04/21/24 0443  BP: 118/88 125/76  Pulse: 79 76  Resp: 18 18  Temp: 98.1 F (36.7 C) 98 F (36.7 C)  SpO2: 95% 97%   Vitals:   04/20/24 1105 04/20/24 1203 04/20/24 1925 04/21/24 0443  BP: 128/74 114/82 118/88 125/76  Pulse: 74 88 79 76  Resp: 16 19 18 18   Temp: 98.5 F (36.9 C) 98.3 F (36.8 C) 98.1 F (36.7 C) 98 F (36.7 C)  TempSrc: Oral Oral Oral   SpO2: 100% 96% 95% 97%  Weight:      Height:       Body mass index is 56.08 kg/m.  General: Alert awake, not in obvious distress obese built HENT: pupils equally reacting to light,  No scleral pallor or icterus noted. Oral  mucosa is moist.  Chest:  Diminished breath sounds bilaterally. No crackles or wheezes.  CVS: S1 &S2 heard. No murmur.  Regular rate and rhythm. Abdomen: Soft, nontender, nondistended.  Bowel sounds are heard.   Extremities: No cyanosis, clubbing or edema.  Peripheral pulses are palpable.  Nonspecific tenderness on palpation Psych: Alert, awake  and oriented to self, impaired cognition CNS:  No cranial nerve deficits.  Moves extremities Skin: Warm and dry.  No rashes noted.  The results of significant diagnostics from this hospitalization (including imaging, microbiology, ancillary and laboratory) are listed below for reference.     Diagnostic Studies:   CT Head Wo Contrast Result Date: 04/03/2024 CLINICAL DATA:  Mental status change, unknown cause Metastatic disease evaluation AMS x 2 weeks, increased urination with odor since last night, some shob, history of brain cancer. 152/90-80-98% RA-CBG 112  EXAM: CT HEAD WITHOUT CONTRAST TECHNIQUE: Contiguous axial images were obtained from the base of the skull through the vertex without intravenous contrast. RADIATION DOSE REDUCTION: This exam was performed according to the departmental dose-optimization program which includes automated exposure control, adjustment of the mA and/or kV according to patient size and/or use of iterative reconstruction technique. COMPARISON:  CT head 01/23/2024 FINDINGS: Brain: Cerebral ventricle sizes are concordant with the degree of cerebral volume loss. Patchy and confluent areas of decreased attenuation are noted throughout the deep and periventricular white matter of the cerebral hemispheres bilaterally, compatible with chronic microvascular ischemic disease. Similar-appearing left frontal encephalomalacia and resection. No evidence of large-territorial acute infarction. No parenchymal hemorrhage. No mass lesion. No extra-axial collection. No mass effect or midline shift. No hydrocephalus. Basilar cisterns are patent. Vascular: No hyperdense vessel. Skull: No acute fracture or focal lesion. Prior left frontal craniotomy. Sinuses/Orbits: Paranasal sinuses and mastoid air cells are clear. Left lens replacement. Otherwise the orbits are unremarkable. Other: None. IMPRESSION: No acute intracranial abnormality.Similar-appearing left frontal encephalomalacia and resection.  Please note CT is limited for evaluation of recurrent or metastatic masses, consider MRI with and without contrast for further evaluation. Electronically Signed   By: Morgane  Naveau M.D.   On: 04/03/2024 17:22   DG Chest Portable 1 View Result Date: 04/03/2024 EXAM: 1 VIEW XRAY OF THE CHEST 04/03/2024 04:29:00 PM COMPARISON: 01/23/2023 CLINICAL HISTORY: AMS. AMS x 2 weeks, increased urination with odor since last night, some shob, history of brain cancer. FINDINGS: LUNGS AND PLEURA: Low lung volumes with some crowding of bibasilar bronchovascular markings. No focal pulmonary opacity. No pulmonary edema. No pleural effusion. No pneumothorax. HEART AND MEDIASTINUM: No acute abnormality of the cardiac and mediastinal silhouettes. BONES AND SOFT TISSUES: Stable left IJ powerport to the proximal SVC. Surgical clips right axilla. No acute osseous abnormality. IMPRESSION: 1. No acute findings. 2. Low lung volumes with some crowding of bibasilar bronchovascular markings. Electronically signed by: Katheleen Faes MD 04/03/2024 04:32 PM EDT RP Workstation: [id number]     Labs:   Basic Metabolic Panel: Recent Labs  Lab 04/15/24 0426 04/17/24 0422  NA 136 134*  K 3.1* 3.8  CL 100 97*  CO2 28 26  GLUCOSE 98 95  BUN 8 8  CREATININE 0.54 0.69  CALCIUM  8.8* 8.8*  MG 1.9  --    GFR Estimated Creatinine Clearance: 120.8 mL/min (by C-G formula based on SCr of 0.69 mg/dL). Liver Function Tests: No results for input(s): AST, ALT, ALKPHOS, BILITOT, PROT, ALBUMIN in the last 168 hours. No results for input(s): LIPASE, AMYLASE in the last 168 hours. No results for input(s): AMMONIA in  the last 168 hours. Coagulation profile No results for input(s): INR, PROTIME in the last 168 hours.  CBC: Recent Labs  Lab 04/15/24 0426 04/17/24 0422  WBC 3.0* 2.9*  HGB 12.2 12.4  HCT 40.1 41.6  MCV 90.5 93.3  PLT 172 176   Cardiac Enzymes: No results for input(s): CKTOTAL, CKMB,  CKMBINDEX, TROPONINI in the last 168 hours. BNP: Invalid input(s): POCBNP CBG: No results for input(s): GLUCAP in the last 168 hours. D-Dimer No results for input(s): DDIMER in the last 72 hours. Hgb A1c No results for input(s): HGBA1C in the last 72 hours. Lipid Profile No results for input(s): CHOL, HDL, LDLCALC, TRIG, CHOLHDL, LDLDIRECT in the last 72 hours. Thyroid  function studies No results for input(s): TSH, T4TOTAL, T3FREE, THYROIDAB in the last 72 hours.  Invalid input(s): FREET3 Anemia work up No results for input(s): VITAMINB12, FOLATE, FERRITIN, TIBC, IRON, RETICCTPCT in the last 72 hours. Microbiology No results found for this or any previous visit (from the past 240 hours).   Discharge Instructions:   Discharge Instructions     Call MD for:  persistant nausea and vomiting   Complete by: As directed    Call MD for:  severe uncontrolled pain   Complete by: As directed    Call MD for:  temperature >100.4   Complete by: As directed    Diet - low sodium heart healthy   Complete by: As directed    Discharge instructions   Complete by: As directed    Follow-up with your primary care provider at the skilled nursing facility in 3 to 5 days.  Check blood work at that time.  Seek medical attention for worsening symptoms.   Increase activity slowly   Complete by: As directed    Increase activity slowly   Complete by: As directed       Allergies as of 04/21/2024       Reactions   Dilaudid [hydromorphone Hcl] Other (See Comments)   Confusion & hallucinations   Morphine And Codeine Other (See Comments)   Headaches        Medication List     STOP taking these medications    diclofenac  Sodium 1 % Gel Commonly known as: VOLTAREN    lisinopril  10 MG tablet Commonly known as: ZESTRIL    MELATONIN GUMMIES PO       TAKE these medications    amLODipine  5 MG tablet Commonly known as: NORVASC  Take 1 tablet (5 mg  total) by mouth daily.   amoxicillin  500 MG capsule Commonly known as: AMOXIL  Take 1 capsule (500 mg total) by mouth 3 (three) times daily. Notes to patient: Take with food   divalproex  250 MG DR tablet Commonly known as: DEPAKOTE  Take 1 tablet (250 mg total) by mouth 2 (two) times daily.   furosemide  20 MG tablet Commonly known as: LASIX  Take 1 tablet (20 mg total) by mouth in the morning.   gabapentin  300 MG capsule Commonly known as: NEURONTIN  Take 2 capsules (600 mg total) by mouth 2 (two) times daily.   mirabegron  ER 50 MG Tb24 tablet Commonly known as: Myrbetriq  Take 1 tablet (50 mg total) by mouth daily.   pantoprazole  40 MG tablet Commonly known as: PROTONIX  Take 1 tablet (40 mg total) by mouth daily.   potassium chloride  10 MEQ tablet Commonly known as: KLOR-CON  M Take 2 tablets (20 mEq total) by mouth daily.   thiamine  100 MG tablet Commonly known as: VITAMIN B1 Take 1 tablet (100 mg total) by  mouth daily.   Tylenol  8 Hour Arthritis Pain 650 MG CR tablet Generic drug: acetaminophen  Take 1 tablet (650 mg total) by mouth every 8 (eight) hours as needed for pain.   venlafaxine  XR 75 MG 24 hr capsule Commonly known as: Effexor  XR Take 1 capsule (75 mg total) by mouth daily with breakfast.        Contact information for after-discharge care     Destination     [facility] .   Service: Skilled Nursing Contact information: [address] [place]  [address] [phone]                      Time coordinating discharge: 39 minutes  Signed:  Jemuel Laursen  [facility] 04/21/2024, 9:59 AM

## 2024-04-21 NOTE — TOC Progression Note (Addendum)
 Transition of Care [facility]) - Progression Note    Patient Details  Name: Monica Mccoy MRN: [id number] Date of Birth: [birth year]  [facility]) CM/SW Contact  Doneta Glenys DASEN, RN Phone Number: 04/21/2024, 9:21 AM  Clinical Narrative:    CM called [facility] per [place] insurance shara has been approved. CM updated MD. 10:46 AM FL2 and discharge summary send in HUB to [employer].  Expected Discharge Plan: Skilled Nursing Facility Barriers to Discharge: SNF Pending bed offer, Inadequate or no insurance               Expected Discharge Plan and Services In-house Referral: NA Discharge Planning Services: CM Consult Post Acute Care Choice: NA Living arrangements for the past 2 months: Apartment Expected Discharge Date: 04/08/24               DME Arranged: N/A DME Agency: NA       HH Arranged: NA HH Agency: NA         Social Drivers of Health (SDOH) Interventions SDOH Screenings   Food Insecurity: Food Insecurity Present (04/03/2024)  Housing: Unknown (04/03/2024)  Transportation Needs: Unmet Transportation Needs (04/03/2024)  Utilities: At Risk (04/03/2024)  Alcohol Screen: Low Risk  (03/27/2022)  Depression (PHQ2-9): High Risk (05/21/2022)  Financial Resource Strain: Low Risk  (03/27/2022)  Physical Activity: Insufficiently Active (03/27/2022)  Social Connections: Feeling Socially Integrated (01/11/2023)   Received from [facility]  Stress: No Stress Concern Present (03/27/2022)  Tobacco Use: Low Risk  (04/03/2024)    Readmission Risk Interventions    08/19/2023   11:05 AM 06/11/2022   11:48 AM 06/10/2022   10:14 AM  Readmission Risk Prevention Plan  Post Dischage Appt Complete    Medication Screening Complete    Transportation Screening Complete Complete Complete  PCP or Specialist Appt within 5-7 Days  Complete Complete  Home Care Screening  Complete Complete  Medication Review (RN CM)  Complete Complete

## 2024-04-21 NOTE — Progress Notes (Signed)
 Attempted to call report to [facility] x 1. Gave call back number to transport.   Report given to Melinda,LPN at [facility] at 1308. All questions asked and answered.  Pt had no peripheral access to remove at this time. All belongings  gathered with transport. Pt ready for discharge. AVS printed and sent with transport.

## 2024-04-23 NOTE — TOC Progression Note (Signed)
 Transition of Care [facility]) - Progression Note    Patient Details  Name: Monica Mccoy MRN: [id number] Date of Birth: [birth year]  Transition of [facility]) CM/SW Contact  Doneta Glenys DASEN, RN Phone Number: 04/23/2024, 3:02 PM  Clinical Narrative:    Patients insurance [facility] requested discharge summary. Faxed to [phone]   Expected Discharge Plan: Skilled Nursing Facility Barriers to Discharge: Barriers Resolved               Expected Discharge Plan and Services In-house Referral: NA Discharge Planning Services: CM Consult Post Acute Care Choice: NA Living arrangements for the past 2 months: Apartment Expected Discharge Date: 04/21/24               DME Arranged: N/A DME Agency: NA       HH Arranged: NA HH Agency: NA         Social Drivers of Health (SDOH) Interventions SDOH Screenings   Food Insecurity: Food Insecurity Present (04/03/2024)  Housing: Unknown (04/03/2024)  Transportation Needs: Unmet Transportation Needs (04/03/2024)  Utilities: At Risk (04/03/2024)  Alcohol Screen: Low Risk  (03/27/2022)  Depression (PHQ2-9): High Risk (05/21/2022)  Financial Resource Strain: Low Risk  (03/27/2022)  Physical Activity: Insufficiently Active (03/27/2022)  Social Connections: Feeling Socially Integrated (01/11/2023)   Received from [facility]  Stress: No Stress Concern Present (03/27/2022)  Tobacco Use: Low Risk  (04/03/2024)    Readmission Risk Interventions    08/19/2023   11:05 AM 06/11/2022   11:48 AM 06/10/2022   10:14 AM  Readmission Risk Prevention Plan  Post Dischage Appt Complete    Medication Screening Complete    Transportation Screening Complete Complete Complete  PCP or Specialist Appt within 5-7 Days  Complete Complete  Home Care Screening  Complete Complete  Medication Review (RN CM)  Complete Complete

## 2024-05-13 ENCOUNTER — Encounter: Payer: Self-pay | Admitting: Hematology and Oncology

## 2024-05-18 ENCOUNTER — Encounter: Payer: Self-pay | Admitting: Hematology and Oncology

## 2024-05-20 ENCOUNTER — Encounter: Admitting: Family

## 2024-05-21 ENCOUNTER — Encounter: Payer: Self-pay | Admitting: Hematology and Oncology

## 2024-05-22 ENCOUNTER — Other Ambulatory Visit: Payer: Self-pay | Admitting: *Deleted

## 2024-05-22 DIAGNOSIS — C50911 Malignant neoplasm of unspecified site of right female breast: Secondary | ICD-10-CM

## 2024-05-25 ENCOUNTER — Inpatient Hospital Stay: Admitting: Hematology and Oncology

## 2024-05-25 ENCOUNTER — Inpatient Hospital Stay

## 2024-05-25 NOTE — Assessment & Plan Note (Deleted)
 Moved to [place]  from [place] , [facility]), in 02/2020 to live with her parents and son near her sister. 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   Current treatment: Herceptin  maintenance every 4 weeks started 11/21/2020 Herceptin  toxicities: None   Brain MRI 09/26/2020 (confusion as well as frequent falls.):  No intracranial abnormalities Brain MRI 06/12/2022: No change    CT CAP 03/29/2022: Stable examination without any progression.  Cholelithiasis. CT abdomen and pelvis 06/08/2022: No metastatic disease  CT chest: 11/14/2023: No evidence of metastatic disease   hospitalization 01/28/2022-02/02/2022: Seizures: Recommendation of taking higher doses of gabapentin  along with Depakote  Hospitalization 06/08/2022-06/18/2022: COVID-19 related mental status changes Hospitalization: 04/03/2024-04/21/2024: Acute metabolic encephalopathy secondary to E. coli UTI Hospitalization 05/05/2024-05/13/2024: Encephalopathy, LP negative   We have not seen her in a very long time. Patient has been following with [facility].

## 2024-06-08 ENCOUNTER — Inpatient Hospital Stay

## 2024-06-08 ENCOUNTER — Inpatient Hospital Stay: Admitting: Hematology and Oncology

## 2024-06-10 ENCOUNTER — Inpatient Hospital Stay: Attending: Hematology and Oncology

## 2024-06-10 ENCOUNTER — Inpatient Hospital Stay (HOSPITAL_BASED_OUTPATIENT_CLINIC_OR_DEPARTMENT_OTHER): Admitting: Hematology and Oncology

## 2024-06-10 DIAGNOSIS — C50911 Malignant neoplasm of unspecified site of right female breast: Secondary | ICD-10-CM

## 2024-06-10 DIAGNOSIS — Z9013 Acquired absence of bilateral breasts and nipples: Secondary | ICD-10-CM | POA: Diagnosis not present

## 2024-06-10 DIAGNOSIS — Z923 Personal history of irradiation: Secondary | ICD-10-CM | POA: Insufficient documentation

## 2024-06-10 DIAGNOSIS — Z853 Personal history of malignant neoplasm of breast: Secondary | ICD-10-CM | POA: Insufficient documentation

## 2024-06-10 DIAGNOSIS — Z86711 Personal history of pulmonary embolism: Secondary | ICD-10-CM | POA: Diagnosis not present

## 2024-06-10 DIAGNOSIS — C7931 Secondary malignant neoplasm of brain: Secondary | ICD-10-CM

## 2024-06-10 LAB — CBC WITH DIFFERENTIAL (CANCER CENTER ONLY)
Abs Immature Granulocytes: 0.01 K/uL (ref 0.00–0.07)
Basophils Absolute: 0 K/uL (ref 0.0–0.1)
Basophils Relative: 1 %
Eosinophils Absolute: 0.1 K/uL (ref 0.0–0.5)
Eosinophils Relative: 3 %
HCT: 43.6 % (ref 36.0–46.0)
Hemoglobin: 14.1 g/dL (ref 12.0–15.0)
Immature Granulocytes: 0 %
Lymphocytes Relative: 26 %
Lymphs Abs: 0.8 K/uL (ref 0.7–4.0)
MCH: 28.7 pg (ref 26.0–34.0)
MCHC: 32.3 g/dL (ref 30.0–36.0)
MCV: 88.8 fL (ref 80.0–100.0)
Monocytes Absolute: 0.5 K/uL (ref 0.1–1.0)
Monocytes Relative: 16 %
Neutro Abs: 1.8 K/uL (ref 1.7–7.7)
Neutrophils Relative %: 54 %
Platelet Count: 179 K/uL (ref 150–400)
RBC: 4.91 MIL/uL (ref 3.87–5.11)
RDW: 15.7 % — ABNORMAL HIGH (ref 11.5–15.5)
WBC Count: 3.3 K/uL — ABNORMAL LOW (ref 4.0–10.5)
nRBC: 0 % (ref 0.0–0.2)

## 2024-06-10 LAB — CMP (CANCER CENTER ONLY)
ALT: 21 U/L (ref 0–44)
AST: 23 U/L (ref 15–41)
Albumin: 3.6 g/dL (ref 3.5–5.0)
Alkaline Phosphatase: 69 U/L (ref 38–126)
Anion gap: 8 (ref 5–15)
BUN: 7 mg/dL (ref 6–20)
CO2: 31 mmol/L (ref 22–32)
Calcium: 9.8 mg/dL (ref 8.9–10.3)
Chloride: 100 mmol/L (ref 98–111)
Creatinine: 0.64 mg/dL (ref 0.44–1.00)
GFR, Estimated: >60 mL/min (ref >60)
Glucose, Bld: 127 mg/dL — ABNORMAL HIGH (ref 70–99)
Potassium: 3.3 mmol/L — ABNORMAL LOW (ref 3.5–5.1)
Sodium: 139 mmol/L (ref 135–145)
Total Bilirubin: 0.9 mg/dL (ref 0.0–1.2)
Total Protein: 7.3 g/dL (ref 6.5–8.1)

## 2024-06-10 NOTE — Assessment & Plan Note (Addendum)
 Moved to [place]  from [place] , [facility]), in 02/2020 to live with her parents and son near her sister. 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   Current treatment: Herceptin  maintenance  11/21/2020-06/29/2022   Brain MRI 09/26/2020 (confusion as well as frequent falls.):  No intracranial abnormalities Brain MRI 06/12/2022: No change    CT CAP 03/29/2022: Stable examination without any progression.  Cholelithiasis. CT abdomen and pelvis 06/08/2022: No metastatic disease    hospitalization 01/28/2022-02/02/2022: Seizures: Recommendation of taking higher doses of gabapentin  along with Depakote  Hospitalization 06/08/2022-06/18/2022: COVID-19 related mental status changes  8/8-8/26/25 for AMS secondary to E. coli UTI  05/05/2024: Hospitalization [facility]: 05/05/2024: CT CAP: Saddle PE/DVT/acute encephalopathy, no evidence of metastatic disease 05/06/2024: Brain MRI at [facility]: Negative for brain metastasis 05/09/2024: LP: Negative for malignancy  Treatment plan: Given the lack of evidence of metastatic disease, I did not recommend reinitiating anti-HER2 therapy.  Instead I recommended that we perform CT CAP in 1 year Will send a message to our brain team to see if they would want to do MRIs periodically.  Continue anticoagulation with Eliquis  Return to clinic in 1 year after scans

## 2024-06-10 NOTE — Progress Notes (Signed)
 Patient Care Team: Pcp, No as PCP - General Monica Potts, MD as Consulting Physician (Hematology and Oncology) Monica Darice HERO, MD as Consulting Physician (Neurology) Pickenpack-Cousar, Monica SAILOR, NP as Nurse Practitioner (Nurse Practitioner)  DIAGNOSIS:  Encounter Diagnosis  Name Primary?   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 BRCA gene testing was negative   08/06/2012 Surgery   Bilateral mastectomies:  Right breast: Invasive ductal carcinoma, 0.7cm, grade 2, with extensive DCIS, clear margins, 8/11 lymph nodes positive, measuring up to 3.8cm,  Left breast, no evidence of malignancy and 6 lymph nodes negative for carcinoma.    2014 - 2015 Chemotherapy   adjuvant chemotherapy with 4 cycles of dose dense Adriamycin and Cytoxan followed by 4 cycles of Taxol Herceptin  and then Herceptin  maintenance. She underwent right chest radiation.       12/24/2013 Relapse/Recurrence   Dizziness, headaches, ataxia, and memory loss. Head CT showed a 1.8cm and a 2.0cm frontal lobe masses. She underwent resection of both masses on 01/07/14,: Moderately differentiated carcinoma, likely of breast origin, HER-2 positive, ER/PR negative, CK7 positive.   2015 - 2015 Radiation Therapy   Received whole brain radiation    Chemotherapy   Taxotere, Herceptin  and Perjeta, but Taxotere was discontinued after 4 cycles       11/21/2020 - 04/03/2022 Chemotherapy   Patient is on Treatment Plan : BREAST Trastuzumab  q21d     02/09/2022 Cancer Staging   Staging form: Breast, AJCC 8th Edition - Pathologic: Stage IV (pM1) - Signed by Crawford Morna Pickle, NP on 02/09/2022   05/07/2022 -  Chemotherapy   Patient is on  Treatment Plan : BREAST Trastuzumab  IV (8/6) or SQ (600) D1 q21d       CHIEF COMPLIANT: Reestablishment of care since she moved back to [place]  from [place]   HISTORY OF PRESENT ILLNESS: [name] is a 58 year old with previous history of brain metastasis from metastatic breast cancer who was on Herceptin  treatment until December 2023 after she moved to [place] area, she did not follow-up with us .  It appears that over the past 2 years her performance status has declined and she moved from 1 nursing home to the next until almost a year ago she moved back to [place] .  Recently she was hospitalized with encephalopathy and word finding issues and scans revealed no evidence of metastatic disease in the brain or around the chest abdomen pelvis but there was pulmonary emboli for which she was started on Eliquis.  She is tolerating Eliquis very well.  She is in the nursing home and brought in today on a bed using ambulance. She is not oriented to time place or person.  ALLERGIES:  is allergic to dilaudid [hydromorphone hcl] and morphine and codeine.  MEDICATIONS:  Current Outpatient Medications  Medication Sig Dispense Refill   amLODipine  (NORVASC ) 5 MG tablet Take 1 tablet (5 mg total) by mouth daily.     amoxicillin  (AMOXIL ) 500 MG capsule Take 1 capsule (500 mg total) by mouth 3 (three) times daily. 6 capsule 0   divalproex  (DEPAKOTE ) 250 MG DR tablet Take 1 tablet (250 mg total) by mouth 2 (two) times daily.     furosemide  (LASIX ) 20 MG tablet Take  1 tablet (20 mg total) by mouth in the morning.     gabapentin  (NEURONTIN ) 300 MG capsule Take 2 capsules (600 mg total) by mouth 2 (two) times daily.     mirabegron  ER (MYRBETRIQ ) 50 MG TB24 tablet Take 1 tablet (50 mg total) by mouth daily.     pantoprazole  (PROTONIX ) 40 MG tablet Take 1 tablet (40 mg total) by mouth daily.     potassium chloride  SA (KLOR-CON  M) 10 MEQ tablet Take 2 tablets (20 mEq total) by mouth daily.      thiamine  (VITAMIN B1) 100 MG tablet Take 1 tablet (100 mg total) by mouth daily.     TYLENOL  8 HOUR ARTHRITIS PAIN 650 MG CR tablet Take 1 tablet (650 mg total) by mouth every 8 (eight) hours as needed for pain.     venlafaxine  XR (EFFEXOR  XR) 75 MG 24 hr capsule Take 1 capsule (75 mg total) by mouth daily with breakfast.     No current facility-administered medications for this visit.    PHYSICAL EXAMINATION: ECOG PERFORMANCE STATUS: 1 - Symptomatic but completely ambulatory  Vitals:   06/10/24 1040  BP: 116/80  Pulse: 77  Resp: 18  Temp: 98.5 F (36.9 C)  SpO2: 95%   Filed Weights   06/10/24 1040  Weight: (!) 315 lb (142.9 kg)      LABORATORY DATA:  I have reviewed the data as listed    Latest Ref Rng & Units 06/10/2024   10:01 AM 04/17/2024    4:22 AM 04/15/2024    4:26 AM  CMP  Glucose 70 - 99 mg/dL 872  95  98   BUN 6 - 20 mg/dL 7  8  8    Creatinine 0.44 - 1.00 mg/dL 9.35  9.30  9.45   Sodium 135 - 145 mmol/L 139  134  136   Potassium 3.5 - 5.1 mmol/L 3.3  3.8  3.1   Chloride 98 - 111 mmol/L 100  97  100   CO2 22 - 32 mmol/L 31  26  28    Calcium  8.9 - 10.3 mg/dL 9.8  8.8  8.8   Total Protein 6.5 - 8.1 g/dL 7.3     Total Bilirubin 0.0 - 1.2 mg/dL 0.9     Alkaline Phos 38 - 126 U/L 69     AST 15 - 41 U/L 23     ALT 0 - 44 U/L 21       Lab Results  Component Value Date   WBC 3.3 (L) 06/10/2024   HGB 14.1 06/10/2024   HCT 43.6 06/10/2024   MCV 88.8 06/10/2024   PLT 179 06/10/2024   NEUTROABS 1.8 06/10/2024    ASSESSMENT & PLAN:  Cancer of right breast metastatic to brain [facility]) Moved to [place]  from [place] , [facility]), in 02/2020 to live with her parents and son near her sister. 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   Current treatment: Herceptin  maintenance  11/21/2020-06/29/2022   Brain MRI 09/26/2020 (confusion as well as frequent falls.):  No intracranial abnormalities Brain MRI 06/12/2022: No change    CT CAP 03/29/2022: Stable examination without any progression.  Cholelithiasis. CT abdomen and pelvis 06/08/2022: No metastatic disease  hospitalization 01/28/2022-02/02/2022: Seizures: Recommendation of taking higher doses of gabapentin  along with Depakote  Hospitalization 06/08/2022-06/18/2022: COVID-19 related mental status changes  8/8-8/26/25 for AMS secondary to E. coli UTI  05/05/2024: Hospitalization [facility]: 05/05/2024: CT CAP: Saddle PE/DVT/acute encephalopathy, no evidence of metastatic disease 05/06/2024: Brain MRI at [facility]: Negative for brain metastasis 05/09/2024: LP: Negative for malignancy  Treatment plan: Given the lack of evidence of metastatic disease, I did not recommend reinitiating anti-HER2 therapy.  Instead I recommended that we perform CT CAP in 1 year Will send a message to our brain team to see if they would want to do MRIs periodically.  Continue anticoagulation with Eliquis  Return to clinic in 1 year after scans        Orders Placed This Encounter  Procedures   CT CHEST ABDOMEN PELVIS W CONTRAST    Standing Status:   Future    Expected Date:   06/09/2025    Expiration Date:   09/07/2025    If indicated for the ordered procedure, I authorize the administration of contrast media per Radiology protocol:   Yes    Does the patient have a contrast media/X-ray dye allergy?:   No    Preferred imaging location?:   [facility]    Release to patient:   Immediate    If indicated for the ordered procedure, I authorize the administration of oral contrast media per Radiology protocol:   No    Reason for no oral contrast::   Breast   The patient has a good  understanding of the overall plan. she agrees with it. she will call with any problems that may develop before the next visit here.  I personally spent a total of 45 minutes in the care of the patient today including preparing to see the patient, getting/reviewing separately obtained history, performing a medically appropriate exam/evaluation, counseling and educating, placing orders, referring and communicating with other health care professionals, documenting clinical information in the EHR, independently interpreting results, communicating results, and coordinating care.   Viinay K Nena Hampe, MD 06/10/24

## 2024-06-11 ENCOUNTER — Other Ambulatory Visit: Payer: Self-pay | Admitting: *Deleted

## 2024-06-11 DIAGNOSIS — C50911 Malignant neoplasm of unspecified site of right female breast: Secondary | ICD-10-CM

## 2024-06-16 ENCOUNTER — Inpatient Hospital Stay: Admitting: Family Medicine

## 2024-06-17 ENCOUNTER — Encounter: Payer: Self-pay | Admitting: Hematology and Oncology

## 2024-06-24 ENCOUNTER — Telehealth: Payer: Self-pay | Admitting: Internal Medicine

## 2024-06-24 NOTE — Telephone Encounter (Signed)
 I called patient and spoke to sister. Appointment for MRI results October 2026 has been scheduled.

## 2024-06-29 ENCOUNTER — Encounter: Payer: Self-pay | Admitting: Radiology

## 2024-07-16 ENCOUNTER — Emergency Department (HOSPITAL_COMMUNITY)

## 2024-07-16 ENCOUNTER — Inpatient Hospital Stay (HOSPITAL_COMMUNITY)
Admission: EM | Admit: 2024-07-16 | Discharge: 2024-07-20 | DRG: 871 | Disposition: A | Discharge status: Skilled Nursing Facility | Source: Skilled Nursing Facility | Attending: Internal Medicine | Admitting: Internal Medicine

## 2024-07-16 DIAGNOSIS — J189 Pneumonia, unspecified organism: Secondary | ICD-10-CM | POA: Diagnosis present

## 2024-07-16 DIAGNOSIS — N12 Tubulo-interstitial nephritis, not specified as acute or chronic: Secondary | ICD-10-CM

## 2024-07-16 DIAGNOSIS — R7989 Other specified abnormal findings of blood chemistry: Secondary | ICD-10-CM | POA: Diagnosis present

## 2024-07-16 DIAGNOSIS — N179 Acute kidney failure, unspecified: Secondary | ICD-10-CM | POA: Diagnosis present

## 2024-07-16 DIAGNOSIS — Z923 Personal history of irradiation: Secondary | ICD-10-CM

## 2024-07-16 DIAGNOSIS — Z515 Encounter for palliative care: Secondary | ICD-10-CM

## 2024-07-16 DIAGNOSIS — Z171 Estrogen receptor negative status [ER-]: Secondary | ICD-10-CM

## 2024-07-16 DIAGNOSIS — C711 Malignant neoplasm of frontal lobe: Secondary | ICD-10-CM | POA: Diagnosis present

## 2024-07-16 DIAGNOSIS — I10 Essential (primary) hypertension: Secondary | ICD-10-CM | POA: Diagnosis present

## 2024-07-16 DIAGNOSIS — A419 Sepsis, unspecified organism: Principal | ICD-10-CM | POA: Diagnosis present

## 2024-07-16 DIAGNOSIS — N10 Acute tubulo-interstitial nephritis: Secondary | ICD-10-CM | POA: Diagnosis present

## 2024-07-16 DIAGNOSIS — R6510 Systemic inflammatory response syndrome (SIRS) of non-infectious origin without acute organ dysfunction: Principal | ICD-10-CM

## 2024-07-16 DIAGNOSIS — Z9221 Personal history of antineoplastic chemotherapy: Secondary | ICD-10-CM

## 2024-07-16 DIAGNOSIS — K760 Fatty (change of) liver, not elsewhere classified: Secondary | ICD-10-CM | POA: Diagnosis present

## 2024-07-16 DIAGNOSIS — E871 Hypo-osmolality and hyponatremia: Secondary | ICD-10-CM | POA: Diagnosis present

## 2024-07-16 DIAGNOSIS — I119 Hypertensive heart disease without heart failure: Secondary | ICD-10-CM | POA: Diagnosis present

## 2024-07-16 DIAGNOSIS — C7931 Secondary malignant neoplasm of brain: Secondary | ICD-10-CM | POA: Diagnosis present

## 2024-07-16 DIAGNOSIS — Z8669 Personal history of other diseases of the nervous system and sense organs: Secondary | ICD-10-CM

## 2024-07-16 DIAGNOSIS — C50919 Malignant neoplasm of unspecified site of unspecified female breast: Secondary | ICD-10-CM | POA: Diagnosis present

## 2024-07-16 DIAGNOSIS — Z7901 Long term (current) use of anticoagulants: Secondary | ICD-10-CM

## 2024-07-16 DIAGNOSIS — F411 Generalized anxiety disorder: Secondary | ICD-10-CM | POA: Diagnosis present

## 2024-07-16 DIAGNOSIS — R7401 Elevation of levels of liver transaminase levels: Secondary | ICD-10-CM | POA: Insufficient documentation

## 2024-07-16 DIAGNOSIS — E66813 Obesity, class 3: Secondary | ICD-10-CM | POA: Diagnosis present

## 2024-07-16 DIAGNOSIS — R4182 Altered mental status, unspecified: Secondary | ICD-10-CM | POA: Diagnosis present

## 2024-07-16 DIAGNOSIS — E785 Hyperlipidemia, unspecified: Secondary | ICD-10-CM | POA: Diagnosis present

## 2024-07-16 DIAGNOSIS — G9341 Metabolic encephalopathy: Secondary | ICD-10-CM | POA: Diagnosis present

## 2024-07-16 DIAGNOSIS — G9389 Other specified disorders of brain: Secondary | ICD-10-CM | POA: Diagnosis present

## 2024-07-16 DIAGNOSIS — Z86718 Personal history of other venous thrombosis and embolism: Secondary | ICD-10-CM

## 2024-07-16 DIAGNOSIS — Z8249 Family history of ischemic heart disease and other diseases of the circulatory system: Secondary | ICD-10-CM

## 2024-07-16 DIAGNOSIS — K219 Gastro-esophageal reflux disease without esophagitis: Secondary | ICD-10-CM | POA: Diagnosis present

## 2024-07-16 DIAGNOSIS — Z87898 Personal history of other specified conditions: Secondary | ICD-10-CM

## 2024-07-16 DIAGNOSIS — Z1722 Progesterone receptor negative status: Secondary | ICD-10-CM

## 2024-07-16 DIAGNOSIS — R9431 Abnormal electrocardiogram [ECG] [EKG]: Secondary | ICD-10-CM | POA: Diagnosis present

## 2024-07-16 DIAGNOSIS — Z79899 Other long term (current) drug therapy: Secondary | ICD-10-CM

## 2024-07-16 DIAGNOSIS — Z853 Personal history of malignant neoplasm of breast: Secondary | ICD-10-CM

## 2024-07-16 DIAGNOSIS — I959 Hypotension, unspecified: Secondary | ICD-10-CM | POA: Diagnosis present

## 2024-07-16 DIAGNOSIS — Z66 Do not resuscitate: Secondary | ICD-10-CM | POA: Diagnosis present

## 2024-07-16 DIAGNOSIS — R0682 Tachypnea, not elsewhere classified: Secondary | ICD-10-CM | POA: Diagnosis present

## 2024-07-16 DIAGNOSIS — F32A Depression, unspecified: Secondary | ICD-10-CM | POA: Diagnosis present

## 2024-07-16 DIAGNOSIS — Z86711 Personal history of pulmonary embolism: Secondary | ICD-10-CM

## 2024-07-16 DIAGNOSIS — R410 Disorientation, unspecified: Secondary | ICD-10-CM

## 2024-07-16 DIAGNOSIS — Z833 Family history of diabetes mellitus: Secondary | ICD-10-CM

## 2024-07-16 DIAGNOSIS — I2699 Other pulmonary embolism without acute cor pulmonale: Secondary | ICD-10-CM | POA: Insufficient documentation

## 2024-07-16 DIAGNOSIS — O0882 Sepsis following ectopic and molar pregnancy: Secondary | ICD-10-CM | POA: Diagnosis present

## 2024-07-16 DIAGNOSIS — Z6841 Body Mass Index (BMI) 40.0 and over, adult: Secondary | ICD-10-CM

## 2024-07-16 DIAGNOSIS — D696 Thrombocytopenia, unspecified: Secondary | ICD-10-CM | POA: Diagnosis present

## 2024-07-16 DIAGNOSIS — I2692 Saddle embolus of pulmonary artery without acute cor pulmonale: Secondary | ICD-10-CM | POA: Diagnosis present

## 2024-07-16 DIAGNOSIS — K8020 Calculus of gallbladder without cholecystitis without obstruction: Secondary | ICD-10-CM | POA: Diagnosis present

## 2024-07-16 DIAGNOSIS — Z9013 Acquired absence of bilateral breasts and nipples: Secondary | ICD-10-CM

## 2024-07-16 DIAGNOSIS — R6520 Severe sepsis without septic shock: Secondary | ICD-10-CM | POA: Diagnosis present

## 2024-07-16 DIAGNOSIS — Z83438 Family history of other disorder of lipoprotein metabolism and other lipidemia: Secondary | ICD-10-CM

## 2024-07-16 DIAGNOSIS — G3184 Mild cognitive impairment, so stated: Secondary | ICD-10-CM | POA: Diagnosis present

## 2024-07-16 LAB — URINALYSIS, W/ REFLEX TO CULTURE (INFECTION SUSPECTED)
Bilirubin Urine: NEGATIVE
Glucose, UA: NEGATIVE mg/dL
Hgb urine dipstick: NEGATIVE
Ketones, ur: 5 mg/dL — AB
Leukocytes,Ua: NEGATIVE
Nitrite: NEGATIVE
Protein, ur: >=300 mg/dL — AB
Specific Gravity, Urine: 1.035 — ABNORMAL HIGH (ref 1.005–1.030)
pH: 5 (ref 5.0–8.0)

## 2024-07-16 LAB — COMPREHENSIVE METABOLIC PANEL WITH GFR
ALT: 188 U/L — ABNORMAL HIGH (ref 0–44)
AST: 113 U/L — ABNORMAL HIGH (ref 15–41)
Albumin: 2.6 g/dL — ABNORMAL LOW (ref 3.5–5.0)
Alkaline Phosphatase: 66 U/L (ref 38–126)
Anion gap: 15 (ref 5–15)
BUN: 14 mg/dL (ref 6–20)
CO2: 24 mmol/L (ref 22–32)
Calcium: 8.8 mg/dL — ABNORMAL LOW (ref 8.9–10.3)
Chloride: 101 mmol/L (ref 98–111)
Creatinine, Ser: 1.25 mg/dL — ABNORMAL HIGH (ref 0.44–1.00)
GFR, Estimated: 50 mL/min — ABNORMAL LOW (ref >60)
Glucose, Bld: 129 mg/dL — ABNORMAL HIGH (ref 70–99)
Potassium: 3.6 mmol/L (ref 3.5–5.1)
Sodium: 140 mmol/L (ref 135–145)
Total Bilirubin: 1.8 mg/dL — ABNORMAL HIGH (ref 0.0–1.2)
Total Protein: 6.8 g/dL (ref 6.5–8.1)

## 2024-07-16 LAB — I-STAT CHEM 8, ED
BUN: 17 mg/dL (ref 6–20)
Calcium, Ion: 1.06 mmol/L — ABNORMAL LOW (ref 1.15–1.40)
Chloride: 104 mmol/L (ref 98–111)
Creatinine, Ser: 1.2 mg/dL — ABNORMAL HIGH (ref 0.44–1.00)
Glucose, Bld: 127 mg/dL — ABNORMAL HIGH (ref 70–99)
HCT: 46 % (ref 36.0–46.0)
Hemoglobin: 15.6 g/dL — ABNORMAL HIGH (ref 12.0–15.0)
Potassium: 3.5 mmol/L (ref 3.5–5.1)
Sodium: 141 mmol/L (ref 135–145)
TCO2: 27 mmol/L (ref 22–32)

## 2024-07-16 LAB — CBC WITH DIFFERENTIAL/PLATELET
Abs Immature Granulocytes: 0.08 K/uL — ABNORMAL HIGH (ref 0.00–0.07)
Basophils Absolute: 0.1 K/uL (ref 0.0–0.1)
Basophils Relative: 0 %
Eosinophils Absolute: 0 K/uL (ref 0.0–0.5)
Eosinophils Relative: 0 %
HCT: 44.2 % (ref 36.0–46.0)
Hemoglobin: 14.6 g/dL (ref 12.0–15.0)
Immature Granulocytes: 1 %
Lymphocytes Relative: 11 %
Lymphs Abs: 1.3 K/uL (ref 0.7–4.0)
MCH: 30.1 pg (ref 26.0–34.0)
MCHC: 33 g/dL (ref 30.0–36.0)
MCV: 91.1 fL (ref 80.0–100.0)
Monocytes Absolute: 2.4 K/uL — ABNORMAL HIGH (ref 0.1–1.0)
Monocytes Relative: 19 %
Neutro Abs: 8.5 K/uL — ABNORMAL HIGH (ref 1.7–7.7)
Neutrophils Relative %: 69 %
Platelets: 117 K/uL — ABNORMAL LOW (ref 150–400)
RBC: 4.85 MIL/uL (ref 3.87–5.11)
RDW: 16.1 % — ABNORMAL HIGH (ref 11.5–15.5)
WBC: 12.3 K/uL — ABNORMAL HIGH (ref 4.0–10.5)
nRBC: 0 % (ref 0.0–0.2)

## 2024-07-16 LAB — I-STAT VENOUS BLOOD GAS, ED
Acid-Base Excess: 6 mmol/L — ABNORMAL HIGH (ref 0.0–2.0)
Bicarbonate: 29.1 mmol/L — ABNORMAL HIGH (ref 20.0–28.0)
Calcium, Ion: 1.08 mmol/L — ABNORMAL LOW (ref 1.15–1.40)
HCT: 44 % (ref 36.0–46.0)
Hemoglobin: 15 g/dL (ref 12.0–15.0)
O2 Saturation: 84 %
Potassium: 3.5 mmol/L (ref 3.5–5.1)
Sodium: 140 mmol/L (ref 135–145)
TCO2: 30 mmol/L (ref 22–32)
pCO2, Ven: 35.7 mmHg — ABNORMAL LOW (ref 44–60)
pH, Ven: 7.519 — ABNORMAL HIGH (ref 7.25–7.43)
pO2, Ven: 43 mmHg (ref 32–45)

## 2024-07-16 LAB — I-STAT CG4 LACTIC ACID, ED: Lactic Acid, Venous: 1.4 mmol/L (ref 0.5–1.9)

## 2024-07-16 LAB — MAGNESIUM: Magnesium: 1.7 mg/dL (ref 1.7–2.4)

## 2024-07-16 MED ORDER — CHLORHEXIDINE GLUCONATE CLOTH 2 % EX PADS
6.0000 | MEDICATED_PAD | Freq: Every day | CUTANEOUS | Status: DC
  Administered 2024-07-18 – 2024-07-20 (×3): 6 via TOPICAL

## 2024-07-16 MED ORDER — LACTATED RINGERS IV BOLUS (SEPSIS)
500.0000 mL | Freq: Once | INTRAVENOUS | Status: AC
  Administered 2024-07-16: 500 mL via INTRAVENOUS

## 2024-07-16 MED ORDER — IOHEXOL 350 MG/ML SOLN
75.0000 mL | Freq: Once | INTRAVENOUS | Status: AC | PRN
  Administered 2024-07-16: 75 mL via INTRAVENOUS

## 2024-07-16 MED ORDER — SODIUM CHLORIDE 0.9 % IV SOLN
2.0000 g | Freq: Once | INTRAVENOUS | Status: AC
  Administered 2024-07-16: 2 g via INTRAVENOUS
  Filled 2024-07-16: qty 12.5

## 2024-07-16 MED ORDER — VANCOMYCIN HCL 10 G IV SOLR
2500.0000 mg | Freq: Once | INTRAVENOUS | Status: AC
  Administered 2024-07-16: 2500 mg via INTRAVENOUS
  Filled 2024-07-16: qty 50

## 2024-07-16 MED ORDER — SODIUM CHLORIDE 0.9% FLUSH: 10.0000 mL | INTRAVENOUS | Status: DC | PRN

## 2024-07-16 NOTE — Progress Notes (Signed)
 ED Pharmacy Antibiotic Sign Off An antibiotic consult was received from an ED provider for vancomycin per pharmacy dosing for sepsis. A chart review was completed to assess appropriateness.   The following one time order(s) were placed:  Vancomycin 2.5 g IV once   Further antibiotic and/or antibiotic pharmacy consults should be ordered by the admitting provider if indicated.   Thank you for allowing pharmacy to be a part of this patient's care.   Danish Ruffins M Reeves Musick, [facility]  Clinical Pharmacist 07/16/24 7:25 PM

## 2024-07-16 NOTE — ED Triage Notes (Signed)
 PT BIB [facility] from [facility] facility with a c/o AMS for the past 3-5 days. PT has also had a foul smelling urine for 3 days without tx. PT is hot totouch and and has a increased respiratory drive. Lung soungs are clear. CBG:147 GCS:9

## 2024-07-16 NOTE — ED Provider Notes (Signed)
 [place] EMERGENCY DEPARTMENT AT [facility] Provider Note   CSN: [id number] Arrival date & time: 07/16/24  8167     Patient presents with: Altered Mental Status   Monica Mccoy is a 58 y.o. female.    Altered Mental Status   Presents because of altered mental status.  According to EMS, patient is not been communicate with staff in the past 3 to 5 days.  Usually able to speak and verbalize and have some conversations but not able to verbalize at this point in time.  Been present for the past 3 to 5 days.  Nursing homes also stated that there was a possible lab abnormality though they did not know what lab abnormality there was.  Concern for possible sepsis given borderline temperature at the nursing home as well as with agreement to ED for further evaluation  Previous medical history reviewed : Patient last admitted and discharged from outside ED on May 13, 2024.  Acute metabolic encephalopathy.  Baseline cognitive deficits.  Suspect related to hyponatremia/infection.  Underlying cognitive impairment.  MRI brain showed left parietal lobe enhancement concerning for recurrence of neoplasm rather than infarct.  Lumbar puncture negative for any kind of acute meningitis or other infection.  No leptomeningeal spread.  Completed antibiotics for UTI pneumonia.  Had saddle PE.  Discharged on Eliquis .       Prior to Admission medications   Medication Sig Start Date End Date Taking? Authorizing Provider  amLODipine  (NORVASC ) 5 MG tablet Take 1 tablet (5 mg total) by mouth daily. 04/21/24 07/20/24  Pokhrel, Laxman, MD  amoxicillin  (AMOXIL ) 500 MG capsule Take 1 capsule (500 mg total) by mouth 3 (three) times daily. 04/08/24   Will Almarie MATSU, MD  divalproex  (DEPAKOTE ) 250 MG DR tablet Take 1 tablet (250 mg total) by mouth 2 (two) times daily. 04/21/24   Pokhrel, Laxman, MD  furosemide  (LASIX ) 20 MG tablet Take 1 tablet (20 mg total) by mouth in the morning. 04/21/24   Pokhrel,  Vernal, MD  gabapentin  (NEURONTIN ) 300 MG capsule Take 2 capsules (600 mg total) by mouth 2 (two) times daily. 04/21/24 07/20/24  Pokhrel, Laxman, MD  mirabegron  ER (MYRBETRIQ ) 50 MG TB24 tablet Take 1 tablet (50 mg total) by mouth daily. 04/21/24   Pokhrel, Vernal, MD  pantoprazole  (PROTONIX ) 40 MG tablet Take 1 tablet (40 mg total) by mouth daily. 04/21/24   Pokhrel, Laxman, MD  potassium chloride  SA (KLOR-CON  M) 10 MEQ tablet Take 2 tablets (20 mEq total) by mouth daily. 04/21/24   Pokhrel, Vernal, MD  thiamine  (VITAMIN B1) 100 MG tablet Take 1 tablet (100 mg total) by mouth daily. 04/21/24   Pokhrel, Vernal, MD  TYLENOL  8 HOUR ARTHRITIS PAIN 650 MG CR tablet Take 1 tablet (650 mg total) by mouth every 8 (eight) hours as needed for pain. 04/21/24   Pokhrel, Laxman, MD  venlafaxine  XR (EFFEXOR  XR) 75 MG 24 hr capsule Take 1 capsule (75 mg total) by mouth daily with breakfast. 04/21/24   Pokhrel, Vernal, MD    Allergies: Dilaudid [hydromorphone hcl] and Morphine and codeine    Review of Systems  Unable to perform ROS: Mental status change    Updated Vital Signs BP 121/72   Pulse 92   Temp 99.4 F (37.4 C) (Oral)   Resp (!) 32   SpO2 100%   Physical Exam Vitals and nursing note reviewed.  Constitutional:      General: She is not in acute distress.    Appearance: She  is well-developed. She is ill-appearing.  HENT:     Head: Normocephalic and atraumatic.  Eyes:     Conjunctiva/sclera: Conjunctivae normal.  Cardiovascular:     Rate and Rhythm: Normal rate and regular rhythm.     Heart sounds: No murmur heard. Pulmonary:     Effort: Pulmonary effort is normal. No respiratory distress.     Breath sounds: Normal breath sounds.  Abdominal:     Palpations: Abdomen is soft.     Tenderness: There is no abdominal tenderness.  Musculoskeletal:        General: No swelling.     Cervical back: Neck supple.  Skin:    General: Skin is warm and dry.     Capillary Refill: Capillary refill takes  less than 2 seconds.  Neurological:     Mental Status: She is alert.     GCS: GCS eye subscore is 4. GCS verbal subscore is 2. GCS motor subscore is 6.     Comments: Patient able to squeeze bilateral hands, look around the room spontaneously, move both legs.  GCS of 12   Psychiatric:        Mood and Affect: Mood normal.     (all labs ordered are listed, but only abnormal results are displayed) Labs Reviewed  COMPREHENSIVE METABOLIC PANEL WITH GFR - Abnormal; Notable for the following components:      Result Value   Glucose, Bld 129 (*)    Creatinine, Ser 1.25 (*)    Calcium  8.8 (*)    Albumin 2.6 (*)    AST 113 (*)    ALT 188 (*)    Total Bilirubin 1.8 (*)    GFR, Estimated 50 (*)    All other components within normal limits  CBC WITH DIFFERENTIAL/PLATELET - Abnormal; Notable for the following components:   WBC 12.3 (*)    RDW 16.1 (*)    Platelets 117 (*)    Neutro Abs 8.5 (*)    Monocytes Absolute 2.4 (*)    Abs Immature Granulocytes 0.08 (*)    All other components within normal limits  URINALYSIS, W/ REFLEX TO CULTURE (INFECTION SUSPECTED) - Abnormal; Notable for the following components:   Color, Urine AMBER (*)    APPearance HAZY (*)    Specific Gravity, Urine 1.035 (*)    Ketones, ur 5 (*)    Protein, ur >=300 (*)    Bacteria, UA RARE (*)    All other components within normal limits  I-STAT VENOUS BLOOD GAS, ED - Abnormal; Notable for the following components:   pH, Ven 7.519 (*)    pCO2, Ven 35.7 (*)    Bicarbonate 29.1 (*)    Acid-Base Excess 6.0 (*)    Calcium , Ion 1.08 (*)    All other components within normal limits  I-STAT CHEM 8, ED - Abnormal; Notable for the following components:   Creatinine, Ser 1.20 (*)    Glucose, Bld 127 (*)    Calcium , Ion 1.06 (*)    Hemoglobin 15.6 (*)    All other components within normal limits  I-STAT CG4 LACTIC ACID, ED - Abnormal; Notable for the following components:   Lactic Acid, Venous <0.3 (*)    All other  components within normal limits  CULTURE, BLOOD (ROUTINE X 2)  CULTURE, BLOOD (ROUTINE X 2)  MRSA NEXT GEN BY PCR, NASAL  MAGNESIUM   PROTIME-INR  AMMONIA  I-STAT CG4 LACTIC ACID, ED    EKG: EKG Interpretation Date/Time:  Thursday July 16 2024 19:25:15  EST Ventricular Rate:  102 PR Interval:  126 QRS Duration:  88 QT Interval:  487 QTC Calculation: 635 R Axis:   -56  Text Interpretation: Sinus tachycardia Left anterior fascicular block Borderline repolarization abnormality Prolonged QT interval Confirmed by Simon Rea [phone]) on 07/16/2024 11:54:14 PM     Procedures   Medications Ordered in the ED  sodium chloride  flush (NS) 0.9 % injection 10-40 mL (has no administration in time range)  Chlorhexidine  Gluconate Cloth 2 % PADS 6 each (has no administration in time range)  lactated ringers  bolus 500 mL (0 mLs Intravenous Stopped 07/16/24 2054)  ceFEPIme  (MAXIPIME ) 2 g in sodium chloride  0.9 % 100 mL IVPB (0 g Intravenous Stopped 07/16/24 2206)  vancomycin  (VANCOCIN ) 2,500 mg in sodium chloride  0.9 % 500 mL IVPB (2,500 mg Intravenous New Bag/Given 07/16/24 2213)  iohexol  (OMNIPAQUE ) 350 MG/ML injection 75 mL (75 mLs Intravenous Contrast Given 07/16/24 2351)                                    Medical Decision Making Amount and/or Complexity of Data Reviewed Labs: ordered. Radiology: ordered.  Risk OTC drugs. Prescription drug management.     HPI:    Presents because of altered mental status.  According to EMS, patient is not been communicate with staff in the past 3 to 5 days.  Usually able to speak and verbalize and have some conversations but not able to verbalize at this point in time.  Been present for the past 3 to 5 days.  Nursing homes also stated that there was a possible lab abnormality though they did not know what lab abnormality there was.  Concern for possible sepsis given borderline temperature at the nursing home as well as with agreement to ED  for further evaluation  Previous medical history reviewed : Patient last admitted and discharged from outside ED on May 13, 2024.  Acute metabolic encephalopathy.  Baseline cognitive deficits.  Suspect related to hyponatremia/infection.  Underlying cognitive impairment.  MRI brain showed left parietal lobe enhancement concerning for recurrence of neoplasm rather than infarct.  Lumbar puncture negative for any kind of acute meningitis or other infection.  No leptomeningeal spread.  Completed antibiotics for UTI pneumonia.  Had saddle PE.  Discharged on Eliquis .  MDM:    In terms of patient's altered mental status.  Will obtain CT brain.  Known parietal lobe lesion in setting of possible metastatic disease.  Will rule out any kind of obvious bleed or other acute pathology.  Some present for the past 3 to 4 days in terms of altered mental status.  Outside of window for any kind of intervention in terms of CVA.  Low concerns for CVA at this time  In terms of patient's new O2 requirement.  Currently on 6 L.  Will obtain chest x-ray.  Recent PE.  Supposed to be compliant with medication but unknown whether or not this has been true at her nursing facility.  Will obtain CTA of the chest to rule any kind of worsening PE burden.  Sepsis workup given altered mental status, tachycardia and tachypnea.  Start patient on broad-spectrum antibiotics including cefepime  given hospital admission to cover for hospital-acquired pneumonia.  Also added vancomycin . Will need UA and need to rule out pneumonia   Reevaluation:   Upon reexamination, patient seems to be more alert.  VBG shows no significant hypercapnia.  Labs show mild leukocytosis  with increased neutrophil count.  Elevated AST and ALT as well as elevated total bilirubin.  Ended up adding on a CT abdomen given this elevated bilirubin and liver enzymes assessment, intra-abdominal pathology at this time.  Patient signed out pending CTA chest as well as CT  abdomen.    Interventions: vancomycin  , cefepime    EKG Interpreted by Me: sinus    Cardiac Tele Interpreted by Me: sinus    I have independently interpreted the CXR      Disposition and Follow Up: admit     CRITICAL CARE Performed by: Lavonia LOISE Pat   Total critical care time: 45 minutes  Critical care time was exclusive of separately billable procedures and treating other patients.  Critical care was necessary to treat or prevent imminent or life-threatening deterioration.  Critical care was time spent personally by me on the following activities: development of treatment plan with patient and/or surrogate as well as nursing, discussions with consultants, evaluation of patient's response to treatment, examination of patient, obtaining history from patient or surrogate, ordering and performing treatments and interventions, ordering and review of laboratory studies, ordering and review of radiographic studies, pulse oximetry and re-evaluation of patient's condition.     Final diagnoses:  SIRS (systemic inflammatory response syndrome) [facility])  Disorientation    ED Discharge Orders     None          Pat Lavonia LOISE, MD 07/17/24 [phone]

## 2024-07-17 ENCOUNTER — Inpatient Hospital Stay (HOSPITAL_COMMUNITY)

## 2024-07-17 ENCOUNTER — Encounter (HOSPITAL_COMMUNITY): Payer: Self-pay

## 2024-07-17 DIAGNOSIS — E66813 Obesity, class 3: Secondary | ICD-10-CM | POA: Diagnosis present

## 2024-07-17 DIAGNOSIS — O0882 Sepsis following ectopic and molar pregnancy: Secondary | ICD-10-CM | POA: Diagnosis present

## 2024-07-17 DIAGNOSIS — I10 Essential (primary) hypertension: Secondary | ICD-10-CM | POA: Diagnosis not present

## 2024-07-17 DIAGNOSIS — Z79899 Other long term (current) drug therapy: Secondary | ICD-10-CM | POA: Diagnosis not present

## 2024-07-17 DIAGNOSIS — N12 Tubulo-interstitial nephritis, not specified as acute or chronic: Secondary | ICD-10-CM | POA: Diagnosis present

## 2024-07-17 DIAGNOSIS — F32A Depression, unspecified: Secondary | ICD-10-CM | POA: Diagnosis present

## 2024-07-17 DIAGNOSIS — D696 Thrombocytopenia, unspecified: Secondary | ICD-10-CM | POA: Diagnosis present

## 2024-07-17 DIAGNOSIS — R7401 Elevation of levels of liver transaminase levels: Secondary | ICD-10-CM | POA: Insufficient documentation

## 2024-07-17 DIAGNOSIS — F411 Generalized anxiety disorder: Secondary | ICD-10-CM | POA: Diagnosis present

## 2024-07-17 DIAGNOSIS — M7989 Other specified soft tissue disorders: Secondary | ICD-10-CM

## 2024-07-17 DIAGNOSIS — I2692 Saddle embolus of pulmonary artery without acute cor pulmonale: Secondary | ICD-10-CM | POA: Diagnosis present

## 2024-07-17 DIAGNOSIS — Z66 Do not resuscitate: Secondary | ICD-10-CM | POA: Diagnosis present

## 2024-07-17 DIAGNOSIS — N10 Acute tubulo-interstitial nephritis: Secondary | ICD-10-CM | POA: Diagnosis present

## 2024-07-17 DIAGNOSIS — K760 Fatty (change of) liver, not elsewhere classified: Secondary | ICD-10-CM | POA: Diagnosis present

## 2024-07-17 DIAGNOSIS — G3184 Mild cognitive impairment, so stated: Secondary | ICD-10-CM | POA: Insufficient documentation

## 2024-07-17 DIAGNOSIS — I2609 Other pulmonary embolism with acute cor pulmonale: Secondary | ICD-10-CM

## 2024-07-17 DIAGNOSIS — I2699 Other pulmonary embolism without acute cor pulmonale: Secondary | ICD-10-CM | POA: Insufficient documentation

## 2024-07-17 DIAGNOSIS — Z515 Encounter for palliative care: Secondary | ICD-10-CM | POA: Diagnosis not present

## 2024-07-17 DIAGNOSIS — C50911 Malignant neoplasm of unspecified site of right female breast: Secondary | ICD-10-CM | POA: Diagnosis not present

## 2024-07-17 DIAGNOSIS — N179 Acute kidney failure, unspecified: Secondary | ICD-10-CM | POA: Diagnosis present

## 2024-07-17 DIAGNOSIS — E871 Hypo-osmolality and hyponatremia: Secondary | ICD-10-CM | POA: Diagnosis present

## 2024-07-17 DIAGNOSIS — C711 Malignant neoplasm of frontal lobe: Secondary | ICD-10-CM | POA: Diagnosis present

## 2024-07-17 DIAGNOSIS — R4182 Altered mental status, unspecified: Secondary | ICD-10-CM | POA: Diagnosis not present

## 2024-07-17 DIAGNOSIS — R6520 Severe sepsis without septic shock: Secondary | ICD-10-CM | POA: Diagnosis present

## 2024-07-17 DIAGNOSIS — Z8249 Family history of ischemic heart disease and other diseases of the circulatory system: Secondary | ICD-10-CM | POA: Diagnosis not present

## 2024-07-17 DIAGNOSIS — C7931 Secondary malignant neoplasm of brain: Secondary | ICD-10-CM | POA: Diagnosis present

## 2024-07-17 DIAGNOSIS — Z86718 Personal history of other venous thrombosis and embolism: Secondary | ICD-10-CM

## 2024-07-17 DIAGNOSIS — R9431 Abnormal electrocardiogram [ECG] [EKG]: Secondary | ICD-10-CM | POA: Insufficient documentation

## 2024-07-17 DIAGNOSIS — A419 Sepsis, unspecified organism: Secondary | ICD-10-CM | POA: Diagnosis present

## 2024-07-17 DIAGNOSIS — E785 Hyperlipidemia, unspecified: Secondary | ICD-10-CM | POA: Diagnosis present

## 2024-07-17 DIAGNOSIS — R52 Pain, unspecified: Secondary | ICD-10-CM | POA: Diagnosis not present

## 2024-07-17 DIAGNOSIS — I119 Hypertensive heart disease without heart failure: Secondary | ICD-10-CM | POA: Diagnosis present

## 2024-07-17 DIAGNOSIS — G9341 Metabolic encephalopathy: Secondary | ICD-10-CM | POA: Diagnosis present

## 2024-07-17 DIAGNOSIS — Z87898 Personal history of other specified conditions: Secondary | ICD-10-CM

## 2024-07-17 DIAGNOSIS — C7940 Secondary malignant neoplasm of unspecified part of nervous system: Secondary | ICD-10-CM

## 2024-07-17 DIAGNOSIS — Z6841 Body Mass Index (BMI) 40.0 and over, adult: Secondary | ICD-10-CM | POA: Diagnosis not present

## 2024-07-17 DIAGNOSIS — J189 Pneumonia, unspecified organism: Secondary | ICD-10-CM | POA: Diagnosis present

## 2024-07-17 DIAGNOSIS — G9389 Other specified disorders of brain: Secondary | ICD-10-CM | POA: Diagnosis present

## 2024-07-17 DIAGNOSIS — Z7901 Long term (current) use of anticoagulants: Secondary | ICD-10-CM | POA: Diagnosis not present

## 2024-07-17 LAB — ECHOCARDIOGRAM COMPLETE
AR max vel: 2.01 cm2
AV Area VTI: 1.79 cm2
AV Area mean vel: 1.74 cm2
AV Mean grad: 3 mmHg
AV Peak grad: 5.5 mmHg
Ao pk vel: 1.18 m/s
Area-P 1/2: 5.75 cm2
Est EF: 55
Height: 66 in
MV VTI: 3.58 cm2
S' Lateral: 2.8 cm
Weight: 5040 [oz_av]

## 2024-07-17 LAB — HEPARIN LEVEL (UNFRACTIONATED)
Heparin Unfractionated: 0.53 [IU]/mL (ref 0.30–0.70)
Heparin Unfractionated: 0.33 [IU]/mL (ref 0.30–0.70)

## 2024-07-17 LAB — PROTIME-INR
INR: 1.1 (ref 0.8–1.2)
Prothrombin Time: 15.2 s (ref 11.4–15.2)

## 2024-07-17 LAB — AMMONIA: Ammonia: <13 umol/L (ref 9–35)

## 2024-07-17 LAB — TROPONIN I (HIGH SENSITIVITY)
Troponin I (High Sensitivity): 72 ng/L — ABNORMAL HIGH (ref <18)
Troponin I (High Sensitivity): 62 ng/L — ABNORMAL HIGH (ref <18)

## 2024-07-17 LAB — VALPROIC ACID LEVEL: Valproic Acid Lvl: <10 ug/mL — ABNORMAL LOW (ref 50–100)

## 2024-07-17 LAB — I-STAT CG4 LACTIC ACID, ED: Lactic Acid, Venous: <0.3 mmol/L — ABNORMAL LOW (ref 0.5–1.9)

## 2024-07-17 LAB — MRSA NEXT GEN BY PCR, NASAL: MRSA by PCR Next Gen: NOT DETECTED

## 2024-07-17 LAB — BRAIN NATRIURETIC PEPTIDE: B Natriuretic Peptide: 271.7 pg/mL — ABNORMAL HIGH (ref 0.0–100.0)

## 2024-07-17 MED ORDER — ACETAMINOPHEN 650 MG RE SUPP: 650.0000 mg | Freq: Four times a day (QID) | RECTAL | Status: DC | PRN

## 2024-07-17 MED ORDER — LACTATED RINGERS IV SOLN: INTRAVENOUS | Status: AC

## 2024-07-17 MED ORDER — VANCOMYCIN HCL 1500 MG/300ML IV SOLN
1500.0000 mg | INTRAVENOUS | Status: DC
  Administered 2024-07-17: 1500 mg via INTRAVENOUS
  Filled 2024-07-17: qty 300

## 2024-07-17 MED ORDER — ONDANSETRON HCL 4 MG PO TABS: 4.0000 mg | ORAL_TABLET | Freq: Four times a day (QID) | ORAL | Status: DC | PRN

## 2024-07-17 MED ORDER — SERTRALINE HCL 50 MG PO TABS
25.0000 mg | ORAL_TABLET | Freq: Every day | ORAL | Status: DC
  Administered 2024-07-17 – 2024-07-20 (×4): 25 mg via ORAL
  Filled 2024-07-17 (×4): qty 1

## 2024-07-17 MED ORDER — PERFLUTREN LIPID MICROSPHERE
1.0000 mL | INTRAVENOUS | Status: AC | PRN
  Administered 2024-07-17: 2 mL via INTRAVENOUS

## 2024-07-17 MED ORDER — SODIUM CHLORIDE 0.9% FLUSH
3.0000 mL | Freq: Two times a day (BID) | INTRAVENOUS | Status: DC
  Administered 2024-07-17 – 2024-07-20 (×7): 3 mL via INTRAVENOUS

## 2024-07-17 MED ORDER — THIAMINE MONONITRATE 100 MG PO TABS
100.0000 mg | ORAL_TABLET | Freq: Every day | ORAL | Status: DC
  Administered 2024-07-17 – 2024-07-20 (×4): 100 mg via ORAL
  Filled 2024-07-17 (×4): qty 1

## 2024-07-17 MED ORDER — PANTOPRAZOLE SODIUM 40 MG PO TBEC
40.0000 mg | DELAYED_RELEASE_TABLET | Freq: Every day | ORAL | Status: DC
  Administered 2024-07-17 – 2024-07-20 (×4): 40 mg via ORAL
  Filled 2024-07-17 (×2): qty 1
  Filled 2024-07-17: qty 2
  Filled 2024-07-17: qty 1

## 2024-07-17 MED ORDER — ACETAMINOPHEN 325 MG PO TABS: 650.0000 mg | ORAL_TABLET | Freq: Four times a day (QID) | ORAL | Status: DC | PRN

## 2024-07-17 MED ORDER — DIVALPROEX SODIUM 250 MG PO DR TAB
250.0000 mg | DELAYED_RELEASE_TABLET | Freq: Two times a day (BID) | ORAL | Status: DC
  Administered 2024-07-17 – 2024-07-18 (×3): 250 mg via ORAL
  Filled 2024-07-17 (×4): qty 1

## 2024-07-17 MED ORDER — SENNOSIDES-DOCUSATE SODIUM 8.6-50 MG PO TABS: 1.0000 | ORAL_TABLET | Freq: Every evening | ORAL | Status: DC | PRN

## 2024-07-17 MED ORDER — SODIUM CHLORIDE 0.9 % IV SOLN: 250.0000 mL | INTRAVENOUS | Status: AC | PRN

## 2024-07-17 MED ORDER — SODIUM CHLORIDE 0.9 % IV SOLN
2.0000 g | Freq: Three times a day (TID) | INTRAVENOUS | Status: DC
  Administered 2024-07-17 – 2024-07-20 (×11): 2 g via INTRAVENOUS
  Filled 2024-07-17 (×11): qty 12.5

## 2024-07-17 MED ORDER — HEPARIN BOLUS VIA INFUSION
4000.0000 [IU] | Freq: Once | INTRAVENOUS | Status: AC
  Administered 2024-07-17: 4000 [IU] via INTRAVENOUS
  Filled 2024-07-17: qty 4000

## 2024-07-17 MED ORDER — SODIUM CHLORIDE 0.9% FLUSH
3.0000 mL | INTRAVENOUS | Status: DC | PRN
Start: 2024-07-17 — End: 2024-07-20

## 2024-07-17 MED ORDER — HEPARIN (PORCINE) 25000 UT/250ML-% IV SOLN
1300.0000 [IU]/h | INTRAVENOUS | Status: DC
  Administered 2024-07-17: 1600 [IU]/h via INTRAVENOUS
  Administered 2024-07-17 – 2024-07-19 (×3): 1700 [IU]/h via INTRAVENOUS
  Administered 2024-07-19: 1400 [IU]/h via INTRAVENOUS
  Filled 2024-07-17 (×5): qty 250

## 2024-07-17 MED ORDER — LACTATED RINGERS IV SOLN: INTRAVENOUS | Status: DC

## 2024-07-17 MED ORDER — ONDANSETRON HCL 4 MG/2ML IJ SOLN: 4.0000 mg | Freq: Four times a day (QID) | INTRAMUSCULAR | Status: DC | PRN

## 2024-07-17 NOTE — ED Provider Notes (Signed)
 I assumed care at signout to follow-up on imaging.  CT abdomen pelvis confirms pyelonephritis.  CT chest confirms PE, though she was diagnosed with a PE/saddle PE back in September and has been on Eliquis . Per previous provider, it appears the patient may have been off her DOAC for several days. Will start heparin  per pharmacy consult. On my exam she is awake and alert but appears confused.  There are no signs of respiratory distress.  There is no hypoxia Discussed with Dr. Sundil for admission    Midge Golas, MD 07/17/24 [phone]

## 2024-07-17 NOTE — ED Notes (Signed)
 PT resting with eyes closed at this time. Visible chest rise and fall.

## 2024-07-17 NOTE — Progress Notes (Signed)
 PHARMACY - ANTICOAGULATION CONSULT NOTE  Pharmacy Consult for heparin  Indication: pulmonary embolus  Allergies  Allergen Reactions   Dilaudid [Hydromorphone Hcl] Other (See Comments)    Confusion & hallucinations   Morphine And Codeine Other (See Comments)    Headaches     Patient Measurements: Height: 5' 6 (167.6 cm) Weight: (!) 142.9 kg (315 lb) IBW/kg (Calculated) : 59.3 HEPARIN  DW (KG): 94.8  Vital Signs: Temp: 97.8 F (36.6 C) (11/21 1756) Temp Source: Oral (11/21 1756) BP: 124/96 (11/21 1756) Pulse Rate: 89 (11/21 1756)  Labs: Recent Labs    07/16/24 2017 07/16/24 2023 07/16/24 2024 07/17/24 0222 07/17/24 0542 07/17/24 0742 07/17/24 1000 07/17/24 1800  HGB 14.6 15.0 15.6*  --   --   --   --   --   HCT 44.2 44.0 46.0  --   --   --   --   --   PLT 117*  --   --   --   --   --   --   --   LABPROT  --   --   --  15.2  --   --   --   --   INR  --   --   --  1.1  --   --   --   --   HEPARINUNFRC  --   --   --   --   --   --  0.53 0.33  CREATININE 1.25*  --  1.20*  --   --   --   --   --   TROPONINIHS  --   --   --   --  72* 62*  --   --     Estimated Creatinine Clearance: 75.7 mL/min (A) (by C-G formula based on SCr of 1.2 mg/dL (H)).   Medical History: Past Medical History:  Diagnosis Date   Anxiety    Breast cancer metastasized to brain [facility]    Left breast   Chronic pain after cancer treatment    Depression    GERD (gastroesophageal reflux disease)    History of cancer chemotherapy    Breast cancer left breast   Seizure [facility])     Assessment: 44 yoF presented with AMS. Pharmacy consulted to dose heparin  for pulmonary embolism. PMH includes breast cancer metastasized to the brain, recent hospital admission for AMS/slurred speech found to have bilat PE with no RHS in 04/2024.   -CTA: Bilateral pulmonary emboli extending to all 5 lobes with RHS -Hgb 15s, plts 117 -Seen 9/9 for acute saddle PE (no RHS on CT chest) and started on Eliquis  (has been  off for several days-last dose at facility was given 06/17/2024)  Heparin  level drifted down to low end therapeutic on 1600 units/hr  Goal of Therapy:  Heparin  level 0.3-0.7 units/ml Monitor platelets by anticoagulation protocol: Yes   Plan:  Increase heparin  gtt to 1700 units/hr to keep at goal F/u heparin  level with AM labs  Dorn Poot, PharmD, [facility] Clinical Pharmacist ED Pharmacist Phone # [phone] 07/17/2024 7:05 PM

## 2024-07-17 NOTE — Progress Notes (Signed)
 PROGRESS NOTE    Monica Mccoy  FMW:[id number] DOB: 01/02/1966 DOA: 07/16/2024 PCP: Pcp, No   Brief Narrative:   Monica Mccoy is a 58 year old female past medical history of cognitive deficit, breast cancer, GERD, hypertension, hyperlipidemia, major depressive disorder, pulmonary embolism, DVT, history of breast cancer with brain metastasis, thrombocytopenia, transaminitis, chronic urinary tension, history of seizure, essential hypertension and chemotherapy induced neuropathy presented to emergency department as she has not been able to communicate for last 2 to 5 days and foul-smelling urine.  At the nursing home concern for sepsis due to borderline elevated temperature.  Patient has previous hospital admission in September 2025 for acute metabolic encephalopathy in the setting of hyponatremia and aspiration pneumonia.  She was also found to have acute pulmonary embolism and started on Eliquis . Per reporting, patient has not taken Eliquis  since October.  Reason unclear so far.  In the ED patient tachypneic, hypoxic and tachycardic.  UA with negative nitrite, leukocyte esterase, WBC 6-10.  CT shows left pyelonephritis.  Started on broad-spectrum antibiotics.  CT angiogram chest reveals bilateral PE with right heart strain.  Started on IV heparin .  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 -Will continue broad-spectrum  antibiotics, follow cultures -CT chest with right lower lobe opacity likely infarction from PE.  Bilateral PE History of DVT PE - Diagnosed with DVT PE in September 2025, apparently patient has not been taking Eliquis  for about a month for unclear reasons. -CT angiogram shows bilateral PE with right heart strain. - Started on IV heparin , will follow-up on echocardiogram - Vital signs are stable. 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  Per chart review from recent oncology visit 10/15 lack of evidence of metastasis.  Oncology recommending reinitiating anti-HER2 therapy and will evaluate CT CAP in 1 year and parotic MRI of the brain. -CT head today showed Left frontal and frontotemporal craniotomies with subjacent encephalomalacia.    Essential hypertension Holding amlodipine  in the setting of sepsis    Acute kidney injury - Elevated creatinine 1.25.  Prerenal acute kidney injury in the setting of sepsis.  Continue maintenance fluid.  Checking urine creatinine and urine sodium.  Continue to monitor improvement of renal function.   Thrombocytopenia -Low platelet count 117.  Currently on heparin  drip.  Continue to monitor   Transaminitis Hepatic steatosis Cholelithiasis -Elevated AST/ALT and bilirubin 1.8.  Transaminitis in the setting of sepsis.  CT abdomen pelvis showing fatty liver, cholelithiasis and no evidence of biliary ductal dilation. -Need to follow-up with hepatic function panel in 4 to 6 weeks in outpatient setting.   Prolonged QTc  prolongation -EKG showing sinus tachycardia heart rate 102 and prolonged QTc 635 ms. -Avoid QT prolonging medications. -Repeat EKG today   History of seizure - Continue Depakote . Will check valproic  acid  level   Generalized anxiety disorder and depression - Continue Zoloft  25 mg daily     DVT  prophylaxis:  IV heparin  gtts Code Status:  Full Code Diet: Heart healthy diet Disposition Plan:   Need to follow-up with blood culture results for antibiotic guidance, echocardiogram and DVT ultrasound study. Consults: None indicated at this time Admission status:   Inpatient, Step Down Unit   Severity of Illness: The appropriate patient status for this patient is INPATIENT. Inpatient status is judged to be reasonable and necessary in order to provide the required intensity of service to ensure the patient's safety. The patient's presenting symptoms, physical exam findings, and initial radiographic and laboratory data in the context of their chronic comorbidities is felt to place them at high risk for further clinical deterioration. Furthermore, it is not anticipated that the patient will be medically stable for discharge from the hospital within 2 midnights of admission.    * I certify that at the point of admission it is my clinical judgment that the patient will require inpatient hospital care spanning beyond 2 midnights from the point of admission due to high intensity of service, high risk for further deterioration and high frequency of surveillance required.*   Subjective:  Patient seen and examined at the bedside.  No family present.  She is alert and able to answer yes and no.  Not in acute distress.  Mildly tachypneic, satting 98% on room air.  Blood pressure normal, heart rate 92/min. Objective: Vitals:   07/17/24 0345 07/17/24 0500 07/17/24 0600 07/17/24 0654  BP: 103/88 (!) 122/104 (!) 130/99   Pulse: 93 92 91   Resp: (!) 28 (!) 36 (!) 28   Temp:    97.6 F (36.4 C)  TempSrc:    Oral  SpO2: 93% 97% 96%   Weight:      Height:        Intake/Output Summary (Last 24 hours) at 07/17/2024 0901 Last data filed at 07/17/2024 0252 Gross per 24 hour  Intake 701.65 ml  Output --  Net 701.65 ml   Filed Weights   07/17/24 0000  Weight: (!) 142.9 kg    Examination:  General:  Alert, not oriented, mildly tachypneic HEENT: Moist oral mucosa Chest: Clear bilaterally CVS: Regular rhythm, tachycardia Abdomen: Soft, nontender, obese Extremities: No edema Neuro: PERRLA, EOMI, cognition likely baseline   Data Reviewed: I have personally reviewed following labs and imaging studies  CBC: Recent Labs  Lab 07/16/24 2017 07/16/24 2023 07/16/24 2024  WBC 12.3*  --   --   NEUTROABS 8.5*  --   --   HGB 14.6 15.0 15.6*  HCT 44.2 44.0 46.0  MCV 91.1  --   --   PLT 117*  --   --    Basic Metabolic Panel: Recent Labs  Lab 07/16/24 2017 07/16/24 2023 07/16/24 2024  NA 140 140 141  K 3.6 3.5 3.5  CL 101  --  104  CO2 24  --   --   GLUCOSE 129*  --  127*  BUN 14  --  17  CREATININE 1.25*  --  1.20*  CALCIUM  8.8*  --   --   MG 1.7  --   --    GFR: Estimated Creatinine Clearance: 75.7 mL/min (A) (by C-G formula based on SCr of 1.2 mg/dL (H)). Liver Function Tests: Recent Labs  Lab 07/16/24 2017  AST 113*  ALT 188*  ALKPHOS 66  BILITOT 1.8*  PROT 6.8  ALBUMIN  2.6*   No results for input(s): LIPASE, AMYLASE in the last 168 hours. Recent Labs  Lab 07/17/24 0222  AMMONIA <13   Coagulation Profile: Recent Labs  Lab 07/17/24 0222  INR 1.1   Cardiac Enzymes: No results for input(s): CKTOTAL, CKMB, CKMBINDEX, TROPONINI in the last 168 hours. BNP (last 3 results) No results for input(s): PROBNP in the last 8760 hours. HbA1C: No results for input(s): HGBA1C in the last 72 hours. CBG: No results for input(s): GLUCAP in the last 168 hours. Lipid Profile: No results for input(s): CHOL, HDL, LDLCALC, TRIG, CHOLHDL, LDLDIRECT in the last 72 hours. Thyroid  Function Tests: No results for input(s): TSH, T4TOTAL, FREET4, T3FREE, THYROIDAB in the last 72 hours. Anemia Panel: No results for input(s): VITAMINB12, FOLATE, FERRITIN, TIBC, IRON, RETICCTPCT in the last 72 hours. Sepsis Labs: Recent Labs   Lab 07/16/24 2024 07/17/24 0012  LATICACIDVEN 1.4 <0.3*    Recent Results (from the past 240 hours)  Blood Culture (routine x 2)     Status: None (Preliminary result)   Collection Time: 07/16/24  8:10 PM   Specimen: BLOOD  Result Value Ref Range Status   Specimen Description BLOOD LEFT ANTECUBITAL  Final   Special Requests   Final    BOTTLES DRAWN AEROBIC AND ANAEROBIC Blood Culture results may not be optimal due to an inadequate volume of blood received in culture bottles   Culture   Final    NO GROWTH < 12 HOURS Performed at [facility], [address] [place], [place] [address]    Report Status PENDING  Incomplete  Blood Culture (routine x 2)     Status: None (Preliminary result)   Collection Time: 07/16/24  8:25 PM   Specimen: BLOOD  Result Value Ref Range Status   Specimen Description BLOOD RIGHT ANTECUBITAL  Final   Special Requests   Final    BOTTLES DRAWN AEROBIC AND ANAEROBIC Blood Culture adequate volume   Culture   Final    NO GROWTH < 12 HOURS Performed at [facility], [address] [place], [place] [address]    Report Status PENDING  Incomplete  MRSA Next Gen by PCR, Nasal     Status: None   Collection Time: 07/17/24 12:05 AM   Specimen: Nasal Mucosa; Nasal Swab  Result Value Ref Range Status   MRSA by PCR Next Gen NOT DETECTED NOT DETECTED Final    Comment: (NOTE) The GeneXpert MRSA Assay (FDA approved for NASAL specimens only), is one component of a comprehensive MRSA colonization surveillance program. It is not intended to diagnose MRSA infection nor to guide or monitor treatment for MRSA infections. Test performance is not FDA approved in patients less than 33 years old. Performed at [facility], [address] [place], [place] [address]          Radiology Studies:  CT Angio Chest PE W and/or Wo Contrast Addendum Date: 07/17/2024  ADDENDUM: The upper abdomen should mention cholelithiasis. Discussed with Dr. Midge by  Dr. Margarite on the phone on 07/17/24 at 12:09pm. ---------------------------------------------------- Electronically signed by: Kate Margarite MD 07/17/2024 12:11 AM EST RP Workstation: [id number]   Result Date: 07/17/2024  CTA CHEST AORTA 07/16/2024 11:50:39 PM TECHNIQUE: CTA of the chest was performed after the administration of 75 mL of intravenous iohexol  (OMNIPAQUE ) 350 MG/ML injection. Multiplanar reformatted images are provided for review. MIP images are provided for review. Automated exposure control, iterative reconstruction, and/or weight based adjustment of the mA/kV was  utilized to reduce the radiation dose to as low as reasonably achievable. COMPARISON: None available. CLINICAL HISTORY: Recently diagnosed with saddle PE. Eval for worsening PE burden. FINDINGS: AORTA: No thoracic aortic dissection. No aneurysm. MEDIASTINUM: Bilateral distal central left and right pulmonary emboli extending to the segmental and subsegmental levels of all 5 lobes. The main pulmonary artery is normal in caliber. Enlarged RV to LV ratio of 1.1. Associated straightening of the interventricular septum. The pericardium demonstrates no acute abnormality. No mediastinal lymphadenopathy. LYMPH NODES: No mediastinal, hilar or axillary lymphadenopathy. LUNGS AND PLEURA: Right lower lobe base airspace opacity. Elevated right hemidiaphragm. No definite pleural effusion or pneumothorax. UPPER ABDOMEN: Nonspecific left perinephric fat stranding. SOFT TISSUES AND BONES: No acute bone or soft tissue abnormality. IMPRESSION: 1. Bilateral distal central left and right pulmonary emboli extending to the segmental and subsegmental levels of all 5 lobes with associated right heart strain. 2. Right lower lobe airspace opacity, which may represent pulmonary infarction versus atelectasis in the setting of an elevated diaphragm. . Electronically signed by: Morgane Naveau MD 07/17/2024 12:08 AM EST RP Workstation: [id number]   CT ABDOMEN PELVIS  W CONTRAST Result Date: 07/16/2024 CLINICAL DATA:  Elevated LFT. EXAM: CT ABDOMEN AND PELVIS WITH CONTRAST TECHNIQUE: Multidetector CT imaging of the abdomen and pelvis was performed using the standard protocol following bolus administration of intravenous contrast. RADIATION DOSE REDUCTION: This exam was performed according to the departmental dose-optimization program which includes automated exposure control, adjustment of the mA and/or kV according to patient size and/or use of iterative reconstruction technique. CONTRAST:  75mL OMNIPAQUE  IOHEXOL  350 MG/ML SOLN COMPARISON:  CT abdomen and pelvis 06/08/2022 FINDINGS: Lower chest: There is atelectasis in the right lung base. Hepatobiliary: There is diffuse fatty infiltration of the liver. Gallstones are present. There is no biliary ductal dilatation. Pancreas: Unremarkable. No pancreatic ductal dilatation or surrounding inflammatory changes. Spleen: Normal in size without focal abnormality. Adrenals/Urinary Tract: There is limited evaluation of the kidneys secondary to patient's arm positioning. There are likely patchy areas of hypoattenuation throughout the left kidney which are ill-defined. There is mild left perinephric fat stranding. There is no hydronephrosis in either kidney. The right kidney and adrenal glands are within normal limits. The bladder is decompressed. Stomach/Bowel: Stomach is within normal limits. Appendix appears normal. No evidence of bowel wall thickening, distention, or inflammatory changes.Sigmoid colon anastomosis is present. Vascular/Lymphatic: No significant vascular findings are present. No enlarged abdominal or pelvic lymph nodes. Reproductive: There is lobulated uterine. There are multiple calcifications likely related to fibroids measuring up to 3.8 cm. Adnexa are within normal limits. Other: No abdominal wall hernia or abnormality. No abdominopelvic ascites. Musculoskeletal: No fracture is seen. IMPRESSION: 1. Findings  suspicious for left-sided pyelonephritis. 2. Cholelithiasis. 3. Fatty infiltration of the liver. 4. Fibroid uterus. Electronically Signed   By: Greig Pique M.D.   On: 07/16/2024 23:58   CT Head Wo Contrast Result Date: 07/16/2024 EXAM: CT HEAD WITHOUT CONTRAST 07/16/2024 08:46:38 PM TECHNIQUE: CT of the head was performed without the administration of intravenous contrast. Automated exposure control, iterative reconstruction, and/or weight based adjustment of the mA/kV was utilized to reduce the radiation dose to as low as reasonably achievable. COMPARISON: Prior MRI examination of 06/12/2022. CLINICAL HISTORY: increasing AMS. last outside MRI showed parietal lobe lesion increasing AMS. last outside MRI showed parietal lobe lesion FINDINGS: BRAIN AND VENTRICLES: Encephalomalacia is present subjacent to the craniotomy flaps. Parenchymal volume loss is commensurate with the patient's age. Periventricular white matter changes are present  likely reflecting the sequela of small vessel ischemia. No acute hemorrhage. No evidence of acute infarct. No hydrocephalus. No extra-axial collection. No mass effect or midline shift. ORBITS: No acute abnormality. SINUSES: Fluid opacification of several inferior bilateral mastoid air cells is again identified, unchanged from prior MRI examination of 06/12/2022 without superimposed osseous erosion. SOFT TISSUES AND SKULL: Left frontal and frontotemporal craniotomies are again identified. No acute soft tissue abnormality. No acute skull fracture. IMPRESSION: 1. No acute intracranial abnormality. 2. Left frontal and frontotemporal craniotomies with subjacent encephalomalacia. 3. Fluid opacification of several inferior bilateral mastoid air cells, unchanged from prior MRI, without superimposed osseous erosion. Electronically signed by: Dorethia Molt MD 07/16/2024 08:56 PM EST RP Workstation: [id number]   DG Chest Port 1 View Result Date: 07/16/2024 CLINICAL DATA:  Questionable  sepsis. EXAM: PORTABLE CHEST 1 VIEW COMPARISON:  Chest radiograph dated 04/03/2024. FINDINGS: Stable positioning of the left Port-A-Cath. Shallow inspiration. No focal consolidation, pleural effusion, pneumothorax. Stable cardiac silhouette. No acute osseous pathology. IMPRESSION: No active disease. Electronically Signed   By: Vanetta Chou M.D.   On: 07/16/2024 19:40      Scheduled Meds:  Chlorhexidine  Gluconate Cloth  6 each Topical Daily   divalproex   250 mg Oral BID   pantoprazole   40 mg Oral Daily   sertraline   25 mg Oral Daily   sodium chloride  flush  3 mL Intravenous Q12H   sodium chloride  flush  3 mL Intravenous Q12H   thiamine   100 mg Oral Daily   Continuous Infusions:  sodium chloride      ceFEPime  (MAXIPIME ) IV Stopped (07/17/24 0252)   heparin  1,600 Units/hr (07/17/24 0208)   lactated ringers  150 mL/hr at 07/17/24 9376   Followed by   lactated ringers      vancomycin             Derryl Duval, MD [facility] 07/17/2024, 9:01 AM

## 2024-07-17 NOTE — ED Provider Notes (Signed)
 Nurse confirmed that patient was taken off of DOAC October 22 by the nursing home   Midge Golas, MD 07/17/24 [phone]

## 2024-07-17 NOTE — Progress Notes (Addendum)
 PHARMACY - ANTICOAGULATION CONSULT NOTE  Pharmacy Consult for heparin  Indication: pulmonary embolus  Allergies  Allergen Reactions   Dilaudid [Hydromorphone Hcl] Other (See Comments)    Confusion & hallucinations   Morphine And Codeine Other (See Comments)    Headaches     Patient Measurements: Height: 5' 6 (167.6 cm) Weight: (!) 142.9 kg (315 lb) IBW/kg (Calculated) : 59.3 HEPARIN  DW (KG): 94.8  Vital Signs: Temp: 99.4 F (37.4 C) (11/20 2216) Temp Source: Oral (11/20 2216) BP: 121/72 (11/20 2315) Pulse Rate: 92 (11/20 2315)  Labs: Recent Labs    07/16/24 2017 07/16/24 2023 07/16/24 2024  HGB 14.6 15.0 15.6*  HCT 44.2 44.0 46.0  PLT 117*  --   --   CREATININE 1.25*  --  1.20*    Estimated Creatinine Clearance: 75.7 mL/min (A) (by C-G formula based on SCr of 1.2 mg/dL (H)).   Medical History: Past Medical History:  Diagnosis Date   Anxiety    Breast cancer metastasized to brain [facility])    Left breast   Chronic pain after cancer treatment    Depression    GERD (gastroesophageal reflux disease)    History of cancer chemotherapy    Breast cancer left breast   Seizure [facility])     Assessment: 62 yoF presented with AMS. Pharmacy consulted to dose heparin  for pulmonary embolism. PMH includes breast cancer metastasized to the brain, recent hospital admission for AMS/slurred speech found to have bilat PE with no RHS in 04/2024.   -CTA: Bilateral pulmonary emboli extending to all 5 lobes with RHS -Hgb 15s, plts 117 -Seen 9/9 for acute saddle PE (no RHS on CT chest) and started on Eliquis  (has been off for several days-last dose at facility was given 06/17/2024)  Goal of Therapy:  Heparin  level 0.3-0.7 units/ml Monitor platelets by anticoagulation protocol: Yes   Plan:  Give 4000 units bolus x 1 Start heparin  infusion at 1600 units/hr Check anti-Xa level in 8 hours and daily while on heparin  Continue to monitor H&H and platelets  Lynwood Poplar, PharmD,  BCPS Clinical Pharmacist 07/17/2024 12:35 AM

## 2024-07-17 NOTE — Progress Notes (Addendum)
 Pharmacy Antibiotic Note  Monica Mccoy is a 58 y.o. female admitted on 07/16/2024 with AMS found to have bilateral PE with RHS and sepsis concerns.  Pharmacy has been consulted for cefepime /vancomycin  dosing.  -WBC 12.3, sCr 1.2 (bl~0.7), afebrile -Blood cultures collected -CT: RLL opacity pulm infarct vs atelectasis; cholelithiasis   Plan: -Cefepime  2g IV every 8 hours -Vancomycin  2500mg  IV x1 -Vancomycin  1500mg  IV every 24 hours (AUC 470, IBW, Vd 0.5, IBW, sCr 1.2) -Monitor renal function -Follow up signs of clinical improvement, LOT, de-escalation of antibiotics   Height: 5' 6 (167.6 cm) Weight: (!) 142.9 kg (315 lb) IBW/kg (Calculated) : 59.3  Temp (24hrs), Avg:99.4 F (37.4 C), Min:99.4 F (37.4 C), Max:99.4 F (37.4 C)  Recent Labs  Lab 07/16/24 2017 07/16/24 2024 07/17/24 0012  WBC 12.3*  --   --   CREATININE 1.25* 1.20*  --   LATICACIDVEN  --  1.4 <0.3*    Estimated Creatinine Clearance: 75.7 mL/min (A) (by C-G formula based on SCr of 1.2 mg/dL (H)).    Allergies  Allergen Reactions   Dilaudid [Hydromorphone Hcl] Other (See Comments)    Confusion & hallucinations   Morphine And Codeine Other (See Comments)    Headaches     Antimicrobials this admission: Cefepime  11/20 >>  Vancomycin  11/20 >>   Microbiology results: 11/20 BCx:  11/20 MRSA PCR:   Thank you for allowing pharmacy to be a part of this patient's care.  Lynwood Poplar, PharmD, BCPS Clinical Pharmacist 07/17/2024 1:15 AM

## 2024-07-17 NOTE — Progress Notes (Signed)
  Echocardiogram 2D Echocardiogram has been performed.  Norleen ORN [facility] 07/17/2024, 11:03 AM

## 2024-07-17 NOTE — ED Notes (Addendum)
 PT cleaned up at this time. Brief changed.  PT is a feed assist.

## 2024-07-17 NOTE — H&P (Signed)
 History and Physical    Monica Mccoy DOA: 07/16/2024  PCP: Pcp, No   Patient coming from: SNF   Chief Complaint:  Chief Complaint  Patient presents with   Altered Mental Status   ED TRIAGE note:PT BIB [facility] from [facility] facility with a c/o AMS for the past 3-5 days. PT has also had a foul smelling urine for 3 days without tx. PT is hot totouch and and has a increased respiratory drive. Lung soungs are clear. CBG:147 GCS:9   HPI:  Monica Mccoy is a 58 year old female past medical history of cognitive deficit, breast cancer, GERD, hypertension, hyperlipidemia, major depressive disorder, pulmonary embolism, DVT, history of breast cancer with brain metastasis, thrombocytopenia, transaminitis, chronic urinary tension, history of seizure, essential hypertension and chemotherapy induced neuropathy presented to emergency department as she has not been able to communicate for last 2 to 5 days and foul-smelling urine.  At the nursing home concern for sepsis due to borderline elevated temperature.  Patient has previous hospital admission in September 2025 for acute metabolic encephalopathy in the setting of hyponatremia and aspiration pneumonia.  During my evaluation at the bedside patient is alert oriented.  She was sleeping however able to wake patient up and have minimal conversation with the patient.  Denies any chest pain, palpitation, shortness of breath, abdominal pain nausea and vomiting.  Denies any UTI symptoms.  However given patient has cognitive impairment at baseline unreliable historian overall.  At presentation to ED patient is tachycardic, tachypneic, and required nonrebreather O2 sat 100% on 15 L. Lab, normal lactic acid.  UA unremarkable.  Blood culture are in process.  CMP elevated creatinine 1.25, transaminitis and hyperbilirubinemia.  CBC showing leukocytosis 12.3, stable H&H and low platelet 117.  Chest x-ray no active disease  process. CT head no acute intercranial abnormality.  Carcinomatosis with encephalomalacia. CT chest abdomen and pelvis concern for left-sided pyelonephritis. CTA chest bilateral PE with associated right heart strain.  In the ED patient received vancomycin  and cefepime , 500 mL of LR bolus. Questionable noncompliance with Eliquis .  In the ED patient being started on heparin  drip.  Hospitalist consulted for further evaluation management of sepsis in the setting of pyelonephritis, AKI, altered mental status and persistent pulmonary embolism.  Update, ED nurse has verified patient nursing home that patient has been off DOAC since October 22/2025 unclear etiology.   Significant labs in the ED: Lab Orders         Blood Culture (routine x 2)         MRSA Next Gen by PCR, Nasal         Urine Culture (for pregnant, neutropenic or urologic patients or patients with an indwelling urinary catheter)         Comprehensive metabolic panel         CBC with Differential         Urinalysis, w/ Reflex to Culture (Infection Suspected) -Urine, Clean Catch         Magnesium          Protime-INR         Ammonia         Heparin  level (unfractionated)         CBC         Comprehensive metabolic panel         Valproic  acid level         Brain natriuretic peptide         I-Stat Lactic Acid, ED  I-Stat venous blood gas, ([facility] ED, [facility], [facility])         I-stat chem 8, ED (not at [facility], [facility] or [facility])       Review of Systems:  Review of Systems  Constitutional:  Negative for chills and fever.  Respiratory:  Negative for cough, sputum production and shortness of breath.   Cardiovascular:  Negative for chest pain and palpitations.  Gastrointestinal:  Negative for abdominal pain, blood in stool, constipation, diarrhea, nausea and vomiting.  Genitourinary:  Negative for dysuria and flank pain.  Musculoskeletal:  Negative for myalgias and neck pain.  Neurological:  Negative for dizziness and headaches.   Psychiatric/Behavioral:  The patient is not nervous/anxious.     Past Medical History:  Diagnosis Date   Anxiety    Breast cancer metastasized to brain [facility])    Left breast   Chronic pain after cancer treatment    Depression    GERD (gastroesophageal reflux disease)    History of cancer chemotherapy    Breast cancer left breast   Seizure [facility])     Past Surgical History:  Procedure Laterality Date   BRAIN SURGERY     brain tumor   MASTECTOMY Bilateral    ORIF ANKLE FRACTURE Right      reports that she has never smoked. She has never used smokeless tobacco. She reports current alcohol use. She reports current drug use. Drug: Marijuana.  Allergies  Allergen Reactions   Dilaudid [Hydromorphone Hcl] Other (See Comments)    Confusion & hallucinations   Morphine And Codeine Other (See Comments)    Headaches     Family History  Problem Relation Age of Onset   Obesity Mother    Hypertension Mother    Hyperlipidemia Mother    Diabetes Mother    Heart disease Mother     Prior to Admission medications   Medication Sig Start Date End Date Taking? Authorizing Provider  amLODipine  (NORVASC ) 5 MG tablet Take 1 tablet (5 mg total) by mouth daily. 04/21/24 07/20/24 Yes Pokhrel, Laxman, MD  divalproex  (DEPAKOTE ) 250 MG DR tablet Take 1 tablet (250 mg total) by mouth 2 (two) times daily. 04/21/24  Yes Pokhrel, Vernal, MD  furosemide  (LASIX ) 20 MG tablet Take 1 tablet (20 mg total) by mouth in the morning. 04/21/24  Yes Pokhrel, Laxman, MD  OXYGEN Inhale 2 L into the lungs in the morning and at bedtime.   Yes [provider]  pantoprazole  (PROTONIX ) 40 MG tablet Take 1 tablet (40 mg total) by mouth daily. 04/21/24  Yes Pokhrel, Laxman, MD  potassium chloride  SA (KLOR-CON  M) 10 MEQ tablet Take 2 tablets (20 mEq total) by mouth daily. Patient taking differently: Take 20 mEq by mouth See admin instructions. Give 1 tablet by mouth every morning and 1 tablet at bedtime for 10 days,  starting on 07/14/24 04/21/24  Yes Pokhrel, Laxman, MD  sertraline  (ZOLOFT ) 25 MG tablet Take 25 mg by mouth in the morning.   Yes [provider]  sodium chloride  0.9 % infusion Inject 100 mLs into the vein in the morning and at bedtime. For 2 days for a total of 2 liters   Yes [provider]  thiamine  (VITAMIN B1) 100 MG tablet Take 1 tablet (100 mg total) by mouth daily. 04/21/24  Yes Pokhrel, Laxman, MD  TYLENOL  8 HOUR ARTHRITIS PAIN 650 MG CR tablet Take 1 tablet (650 mg total) by mouth every 8 (eight) hours as needed for pain. 04/21/24  Yes Pokhrel, Laxman,  MD     Physical Exam: Vitals:   07/17/24 0245 07/17/24 0315 07/17/24 0345 07/17/24 0500  BP:  105/71 103/88 (!) 122/104  Pulse:  92 93 92  Resp:  (!) 28 (!) 28 (!) 36  Temp: 98.9 F (37.2 C)     TempSrc: Axillary     SpO2:  93% 93% 97%  Weight:      Height:        Physical Exam Vitals and nursing note reviewed.  Constitutional:      General: She is not in acute distress.    Appearance: She is obese. She is not ill-appearing.  HENT:     Mouth/Throat:     Mouth: Mucous membranes are moist.  Eyes:     Pupils: Pupils are equal, round, and reactive to light.  Cardiovascular:     Rate and Rhythm: Normal rate and regular rhythm.     Pulses: Normal pulses.     Heart sounds: Normal heart sounds.  Pulmonary:     Effort: Pulmonary effort is normal.     Breath sounds: Normal breath sounds.  Abdominal:     Palpations: Abdomen is soft.     Tenderness: There is no right CVA tenderness or left CVA tenderness.  Musculoskeletal:     Cervical back: Neck supple.  Skin:    Capillary Refill: Capillary refill takes less than 2 seconds.  Neurological:     Mental Status: She is alert.     Comments: Alert oriented to self  Psychiatric:     Comments: Unable to assess      Labs on Admission: I have personally reviewed following labs and imaging studies  CBC: Recent Labs  Lab 07/16/24 2017 07/16/24 2023  07/16/24 2024  WBC 12.3*  --   --   NEUTROABS 8.5*  --   --   HGB 14.6 15.0 15.6*  HCT 44.2 44.0 46.0  MCV 91.1  --   --   PLT 117*  --   --    Basic Metabolic Panel: Recent Labs  Lab 07/16/24 2017 07/16/24 2023 07/16/24 2024  NA 140 140 141  K 3.6 3.5 3.5  CL 101  --  104  CO2 24  --   --   GLUCOSE 129*  --  127*  BUN 14  --  17  CREATININE 1.25*  --  1.20*  CALCIUM  8.8*  --   --   MG 1.7  --   --    GFR: Estimated Creatinine Clearance: 75.7 mL/min (A) (by C-G formula based on SCr of 1.2 mg/dL (H)). Liver Function Tests: Recent Labs  Lab 07/16/24 2017  AST 113*  ALT 188*  ALKPHOS 66  BILITOT 1.8*  PROT 6.8  ALBUMIN 2.6*   No results for input(s): LIPASE, AMYLASE in the last 168 hours. Recent Labs  Lab 07/17/24 0222  AMMONIA <13   Coagulation Profile: Recent Labs  Lab 07/17/24 0222  INR 1.1   Cardiac Enzymes: No results for input(s): CKTOTAL, CKMB, CKMBINDEX, TROPONINI, TROPONINIHS in the last 168 hours. BNP (last 3 results) No results for input(s): BNP in the last 8760 hours. HbA1C: No results for input(s): HGBA1C in the last 72 hours. CBG: No results for input(s): GLUCAP in the last 168 hours. Lipid Profile: No results for input(s): CHOL, HDL, LDLCALC, TRIG, CHOLHDL, LDLDIRECT in the last 72 hours. Thyroid  Function Tests: No results for input(s): TSH, T4TOTAL, FREET4, T3FREE, THYROIDAB in the last 72 hours. Anemia Panel: No results for input(s): VITAMINB12,  FOLATE, FERRITIN, TIBC, IRON, RETICCTPCT in the last 72 hours. Urine analysis:    Component Value Date/Time   COLORURINE AMBER (A) 07/16/2024 2200   APPEARANCEUR HAZY (A) 07/16/2024 2200   LABSPEC 1.035 (H) 07/16/2024 2200   PHURINE 5.0 07/16/2024 2200   GLUCOSEU NEGATIVE 07/16/2024 2200   HGBUR NEGATIVE 07/16/2024 2200   BILIRUBINUR NEGATIVE 07/16/2024 2200   BILIRUBINUR negative 03/09/2022 1332   KETONESUR 5 (A) 07/16/2024 2200    PROTEINUR >=300 (A) 07/16/2024 2200   UROBILINOGEN 0.2 03/09/2022 1332   NITRITE NEGATIVE 07/16/2024 2200   LEUKOCYTESUR NEGATIVE 07/16/2024 2200    Radiological Exams on Admission: I have personally reviewed images CT Angio Chest PE W and/or Wo Contrast Addendum Date: 11/21/202  #1 ADDENDUM: The upper abdomen should mention cholelithiasis. Discussed with Dr. Midge by Dr. Margarite on the phone on 07/17/24 at 12:09pm. ---------------------------------------------------- Electronically signed by: Kate Margarite MD 07/17/2024 12:11 AM EST RP Workstation: [id number]   Result Date: 07/17/2024 ORIGINAL REPORT EXAM: CTA CHEST AORTA 07/16/2024 11:50:39 PM TECHNIQUE: CTA of the chest was performed after the administration of 75 mL of intravenous iohexol  (OMNIPAQUE ) 350 MG/ML injection. Multiplanar reformatted images are provided for review. MIP images are provided for review. Automated exposure control, iterative reconstruction, and/or weight based adjustment of the mA/kV was utilized to reduce the radiation dose to as low as reasonably achievable. COMPARISON: None available. CLINICAL HISTORY: Recently diagnosed with saddle PE. Eval for worsening PE burden. FINDINGS: AORTA: No thoracic aortic dissection. No aneurysm. MEDIASTINUM: Bilateral distal central left and right pulmonary emboli extending to the segmental and subsegmental levels of all 5 lobes. The main pulmonary artery is normal in caliber. Enlarged RV to LV ratio of 1.1. Associated straightening of the interventricular septum. The pericardium demonstrates no acute abnormality. No mediastinal lymphadenopathy. LYMPH NODES: No mediastinal, hilar or axillary lymphadenopathy. LUNGS AND PLEURA: Right lower lobe base airspace opacity. Elevated right hemidiaphragm. No definite pleural effusion or pneumothorax. UPPER ABDOMEN: Nonspecific left perinephric fat stranding. SOFT TISSUES AND BONES: No acute bone or soft tissue abnormality. IMPRESSION: 1. Bilateral  distal central left and right pulmonary emboli extending to the segmental and subsegmental levels of all 5 lobes with associated right heart strain. 2. Right lower lobe airspace opacity, which may represent pulmonary infarction versus atelectasis in the setting of an elevated diaphragm. . Electronically signed by: Morgane Naveau MD 07/17/2024 12:08 AM EST RP Workstation: [id number]   CT ABDOMEN PELVIS W CONTRAST Result Date: 07/16/2024 CLINICAL DATA:  Elevated LFT. EXAM: CT ABDOMEN AND PELVIS WITH CONTRAST TECHNIQUE: Multidetector CT imaging of the abdomen and pelvis was performed using the standard protocol following bolus administration of intravenous contrast. RADIATION DOSE REDUCTION: This exam was performed according to the departmental dose-optimization program which includes automated exposure control, adjustment of the mA and/or kV according to patient size and/or use of iterative reconstruction technique. CONTRAST:  75mL OMNIPAQUE  IOHEXOL  350 MG/ML SOLN COMPARISON:  CT abdomen and pelvis 06/08/2022 FINDINGS: Lower chest: There is atelectasis in the right lung base. Hepatobiliary: There is diffuse fatty infiltration of the liver. Gallstones are present. There is no biliary ductal dilatation. Pancreas: Unremarkable. No pancreatic ductal dilatation or surrounding inflammatory changes. Spleen: Normal in size without focal abnormality. Adrenals/Urinary Tract: There is limited evaluation of the kidneys secondary to patient's arm positioning. There are likely patchy areas of hypoattenuation throughout the left kidney which are ill-defined. There is mild left perinephric fat stranding. There is no hydronephrosis in either kidney. The right kidney and adrenal glands are within  normal limits. The bladder is decompressed. Stomach/Bowel: Stomach is within normal limits. Appendix appears normal. No evidence of bowel wall thickening, distention, or inflammatory changes.Sigmoid colon anastomosis is present.  Vascular/Lymphatic: No significant vascular findings are present. No enlarged abdominal or pelvic lymph nodes. Reproductive: There is lobulated uterine. There are multiple calcifications likely related to fibroids measuring up to 3.8 cm. Adnexa are within normal limits. Other: No abdominal wall hernia or abnormality. No abdominopelvic ascites. Musculoskeletal: No fracture is seen. IMPRESSION: 1. Findings suspicious for left-sided pyelonephritis. 2. Cholelithiasis. 3. Fatty infiltration of the liver. 4. Fibroid uterus. Electronically Signed   By: Greig Pique M.D.   On: 07/16/2024 23:58   CT Head Wo Contrast Result Date: 07/16/2024 EXAM: CT HEAD WITHOUT CONTRAST 07/16/2024 08:46:38 PM TECHNIQUE: CT of the head was performed without the administration of intravenous contrast. Automated exposure control, iterative reconstruction, and/or weight based adjustment of the mA/kV was utilized to reduce the radiation dose to as low as reasonably achievable. COMPARISON: Prior MRI examination of 06/12/2022. CLINICAL HISTORY: increasing AMS. last outside MRI showed parietal lobe lesion increasing AMS. last outside MRI showed parietal lobe lesion FINDINGS: BRAIN AND VENTRICLES: Encephalomalacia is present subjacent to the craniotomy flaps. Parenchymal volume loss is commensurate with the patient's age. Periventricular white matter changes are present likely reflecting the sequela of small vessel ischemia. No acute hemorrhage. No evidence of acute infarct. No hydrocephalus. No extra-axial collection. No mass effect or midline shift. ORBITS: No acute abnormality. SINUSES: Fluid opacification of several inferior bilateral mastoid air cells is again identified, unchanged from prior MRI examination of 06/12/2022 without superimposed osseous erosion. SOFT TISSUES AND SKULL: Left frontal and frontotemporal craniotomies are again identified. No acute soft tissue abnormality. No acute skull fracture. IMPRESSION: 1. No acute  intracranial abnormality. 2. Left frontal and frontotemporal craniotomies with subjacent encephalomalacia. 3. Fluid opacification of several inferior bilateral mastoid air cells, unchanged from prior MRI, without superimposed osseous erosion. Electronically signed by: Dorethia Molt MD 07/16/2024 08:56 PM EST RP Workstation: [id number]   DG Chest Port 1 View Result Date: 07/16/2024 CLINICAL DATA:  Questionable sepsis. EXAM: PORTABLE CHEST 1 VIEW COMPARISON:  Chest radiograph dated 04/03/2024. FINDINGS: Stable positioning of the left Port-A-Cath. Shallow inspiration. No focal consolidation, pleural effusion, pneumothorax. Stable cardiac silhouette. No acute osseous pathology. IMPRESSION: No active disease. Electronically Signed   By: Vanetta Chou M.D.   On: 07/16/2024 19:40     EKG: My personal interpretation of EKG shows: Sinus tachycardia heart rate 102 and prolonged QTc.    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  Presentation to ED patient is tachycardic, borderline hypotensive, tachypneic, initially required oxygen currently on room air.  CBC showed leukocytosis.  Normal lactic acid level. -Chest x-ray no active disease process. CT head no acute intercranial abnormality.  Carcinomatosis with encephalomalacia. CT chest abdomen and pelvis concern for left-sided  pyelonephritis. CTA chest bilateral PE with associated  right heart strain.  Right lower lobe airspace opacity represent pulmonary infraction versus atelectasis. -In the ED patient received vancomycin  and cefepime , 500 mL of LR bolus. Questionable noncompliance with Eliquis .  In the ED patient being started on heparin  drip. -Patient meets sepsis criteria in the setting of pyelonephritis. - Plan to continue broad-spectrum antibiotic coverage given immunocompromised status. - Continue IV vancomycin  and cefepime . -Continue maintenance fluid LR at 150 cc/h for 10 hours followed by 125 cc/h for 1 day. - Need to follow-up with culture results.   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  Per chart review from recent oncology visit 10/15 lack of evidence of metastasis.  Oncology recommending reinitiating anti-HER2 therapy and will evaluate CT CAP in 1 year and parotic MRI of the brain. -CT head today showed Left frontal and frontotemporal craniotomies with subjacent encephalomalacia.   Essential hypertension Holding amlodipine  in the setting of sepsis  Recurrent pulmonary embolism-secondary to medication noncompliance? History of DVT -Patient diagnosed with saddle pulmonary embolism and DVT in September 2025 being started on Eliquis  however ED nurse verified with patient nursing home that Eliquis  has been stopped on June 07, 2024 unclear etiology. - CTA chest showing persistent pulmonary embolism with right heart strain.  Right lower lobe opacity represents pulmonary infraction versus atelectasis. - EKG KG showing sinus tachycardia heart rate 102.  Checking troponin and BNP. - Checking echocardiogram and bilateral lower extremity ultrasound. If ultrasound showed persistent DVT consider consult vascular to discuss IVC filter placement.  Acute kidney injury - Elevated creatinine 1.25.  Prerenal acute  kidney injury in the setting of sepsis.  Continue maintenance fluid.  Checking urine creatinine and urine sodium.  Continue to monitor improvement of renal function.  Thrombocytopenia -Low platelet count 117.  Currently on heparin  drip.  Continue to monitor  Transaminitis Hepatic steatosis Cholelithiasis -Elevated AST/ALT and bilirubin 1.8.  Transaminitis in the setting of sepsis.  CT abdomen pelvis showing fatty liver, cholelithiasis and no evidence of biliary ductal dilation. -Need to follow-up with hepatic function panel in 4 to 6 weeks in outpatient setting.  Prolonged QTc  prolongation -EKG showing sinus tachycardia heart rate 102 and prolonged QTc 635 ms.  History of seizure - Continue Depakote . Will check valproic  acid level  Generalized anxiety disorder and depression - Continue Zoloft  25 mg daily   DVT prophylaxis:  IV heparin  gtts Code Status:  Full Code Diet: Heart healthy diet Disposition Plan:   Need to follow-up with blood culture results for antibiotic guidance, echocardiogram and DVT ultrasound study. Consults: None indicated at this time Admission status:   Inpatient, Step Down Unit  Severity of Illness: The appropriate patient status for this patient is INPATIENT. Inpatient status is judged to be reasonable and necessary in order to provide the required intensity of service to ensure the patient's safety. The patient's presenting symptoms, physical exam findings, and initial radiographic and laboratory data in the context of their chronic comorbidities is felt to place them at high risk for further clinical deterioration. Furthermore, it is not anticipated that the patient will be medically stable for discharge from the hospital within 2 midnights of admission.   * I certify that at the point of admission it is my clinical judgment that the patient will require inpatient hospital care spanning beyond 2 midnights from the point of admission due to high intensity of  service, high risk for further deterioration and high frequency of  surveillance required.DEWAINE    Majestic Molony, MD [facility]  How to contact the [facility] Attending or Consulting provider 7A - 7P or covering provider during after hours 7P -7A, for this patient.  Check the care team in [facility] and look for a) attending/consulting [facility] provider listed and b) the [facility] team listed Log into [email or web address] and use [place]'s universal password to access. If you do not have the password, please contact the hospital operator. Locate the [facility] provider you are looking for under [facility] and page to a number that you can be directly reached. If you still have difficulty reaching the provider, please page the [facility] (Director on Call) for the Hospitalists listed on amion for assistance.  07/17/2024, 6:11 AM

## 2024-07-17 NOTE — ED Notes (Signed)
 Last dose of eliquis  was given 06-17-24 at [facility]. This confirmed by Annabella Needle LPN from the facility

## 2024-07-17 NOTE — Hospital Course (Signed)
  58 year old female past medical history of baseline cognitive deficit, breast cancer, GERD, hypertension, hyperlipidemia, major depressive disorder, pulmonary embolism, DVT, history of breast cancer with brain metastasis, thrombocytopenia, transaminitis, chronic urinary tension, history of seizure, essential hypertension and chemotherapy induced neuropathy presented to emergency department as she has not been able to communicate for last 2 to 5 days.  At the nursing home concern for sepsis due to borderline elevated temperature.  Patient has previous hospital admission in September 2025 for acute metabolic encephalopathy in the setting of hyponatremia and infection.  At presentation to ED patient is tachycardic, tachypneic, and required nonrebreather O2 sat 100% on 15 L. Lab, normal lactic acid.  UA unremarkable.  Blood culture are in process.  CMP elevated creatinine 1.25, transaminitis and hyperbilirubinemia.  CBC showing leukocytosis 12.3, stable H&H and low platelet 117.  Chest x-ray no active disease process. CT head no acute intercranial abnormality.  Carcinomatosis with encephalomalacia. CT chest abdomen and pelvis concern for left-sided pyelonephritis. CTA chest bilateral PE with associated right heart strain.  In the ED patient received vancomycin  and cefepime , 500 mL of LR bolus. Questionable noncompliance with Eliquis .  In the ED patient being started on heparin  drip.  Hospitalist consulted for further evaluation management of sepsis in the setting of pyelonephritis, altered mental status and persistent pulmonary embolism.

## 2024-07-17 NOTE — ED Notes (Signed)
 Pt brief and bedding changed. Purewick placed and pt repositioned in bed.

## 2024-07-17 NOTE — Progress Notes (Signed)
 Venous duplex lower ext  has been completed. Refer to [facility] under chart review to view preliminary results.   07/17/2024  10:29 AM Jekhi Bolin, Ricka BIRCH

## 2024-07-17 NOTE — Plan of Care (Signed)
   Problem: Nutrition: Goal: Adequate nutrition will be maintained Outcome: Progressing   Problem: Pain Managment: Goal: General experience of comfort will improve and/or be controlled Outcome: Progressing   Problem: Safety: Goal: Ability to remain free from injury will improve Outcome: Progressing

## 2024-07-17 NOTE — Progress Notes (Signed)
 PHARMACY - ANTICOAGULATION CONSULT NOTE  Pharmacy Consult for heparin  Indication: pulmonary embolus  Allergies  Allergen Reactions   Dilaudid [Hydromorphone Hcl] Other (See Comments)    Confusion & hallucinations   Morphine And Codeine Other (See Comments)    Headaches     Patient Measurements: Height: 5' 6 (167.6 cm) Weight: (!) 142.9 kg (315 lb) IBW/kg (Calculated) : 59.3 HEPARIN  DW (KG): 94.8  Vital Signs: Temp: 97.6 F (36.4 C) (11/21 0654) Temp Source: Oral (11/21 0654) BP: 143/95 (11/21 0959) Pulse Rate: 83 (11/21 0959)  Labs: Recent Labs    07/16/24 2017 07/16/24 2023 07/16/24 2024 07/17/24 0222 07/17/24 0542 07/17/24 1000  HGB 14.6 15.0 15.6*  --   --   --   HCT 44.2 44.0 46.0  --   --   --   PLT 117*  --   --   --   --   --   LABPROT  --   --   --  15.2  --   --   INR  --   --   --  1.1  --   --   HEPARINUNFRC  --   --   --   --   --  0.53  CREATININE 1.25*  --  1.20*  --   --   --   TROPONINIHS  --   --   --   --  72*  --     Estimated Creatinine Clearance: 75.7 mL/min (A) (by C-G formula based on SCr of 1.2 mg/dL (H)).   Medical History: Past Medical History:  Diagnosis Date   Anxiety    Breast cancer metastasized to brain [facility])    Left breast   Chronic pain after cancer treatment    Depression    GERD (gastroesophageal reflux disease)    History of cancer chemotherapy    Breast cancer left breast   Seizure [facility])     Assessment: 53 yoF presented with AMS. Pharmacy consulted to dose heparin  for pulmonary embolism. PMH includes breast cancer metastasized to the brain, recent hospital admission for AMS/slurred speech found to have bilat PE with no RHS in 04/2024.   -CTA: Bilateral pulmonary emboli extending to all 5 lobes with RHS -Hgb 15s, plts 117 -Seen 9/9 for acute saddle PE (no RHS on CT chest) and started on Eliquis  (has been off for several days-last dose at facility was given 06/17/2024)  11/21 AM - heparin  level therapeutic at  0.53.   Goal of Therapy:  Heparin  level 0.3-0.7 units/ml Monitor platelets by anticoagulation protocol: Yes   Plan:  Continue heparin  infusion at 1600 units/hr Check confirmatory anti-Xa level in 8 hours and daily while on heparin  Continue to monitor H&H and platelets  Rankin Sams, PharmD, BCPS, BCCCP Clinical Pharmacist

## 2024-07-18 DIAGNOSIS — R6520 Severe sepsis without septic shock: Secondary | ICD-10-CM

## 2024-07-18 DIAGNOSIS — I2699 Other pulmonary embolism without acute cor pulmonale: Secondary | ICD-10-CM

## 2024-07-18 DIAGNOSIS — E66813 Obesity, class 3: Secondary | ICD-10-CM

## 2024-07-18 DIAGNOSIS — N10 Acute tubulo-interstitial nephritis: Secondary | ICD-10-CM

## 2024-07-18 DIAGNOSIS — D696 Thrombocytopenia, unspecified: Secondary | ICD-10-CM

## 2024-07-18 DIAGNOSIS — R7401 Elevation of levels of liver transaminase levels: Secondary | ICD-10-CM

## 2024-07-18 DIAGNOSIS — A419 Sepsis, unspecified organism: Secondary | ICD-10-CM

## 2024-07-18 DIAGNOSIS — R4182 Altered mental status, unspecified: Secondary | ICD-10-CM | POA: Diagnosis not present

## 2024-07-18 DIAGNOSIS — J9600 Acute respiratory failure, unspecified whether with hypoxia or hypercapnia: Secondary | ICD-10-CM

## 2024-07-18 LAB — BLOOD CULTURE ID PANEL (REFLEXED) - BCID2

## 2024-07-18 LAB — COMPREHENSIVE METABOLIC PANEL WITH GFR
ALT: 103 U/L — ABNORMAL HIGH (ref 0–44)
AST: 57 U/L — ABNORMAL HIGH (ref 15–41)
Albumin: 2.3 g/dL — ABNORMAL LOW (ref 3.5–5.0)
Alkaline Phosphatase: 58 U/L (ref 38–126)
Anion gap: 12 (ref 5–15)
BUN: 11 mg/dL (ref 6–20)
CO2: 23 mmol/L (ref 22–32)
Calcium: 8.5 mg/dL — ABNORMAL LOW (ref 8.9–10.3)
Chloride: 104 mmol/L (ref 98–111)
Creatinine, Ser: 0.88 mg/dL (ref 0.44–1.00)
GFR, Estimated: >60 mL/min (ref >60)
Glucose, Bld: 106 mg/dL — ABNORMAL HIGH (ref 70–99)
Potassium: 3.2 mmol/L — ABNORMAL LOW (ref 3.5–5.1)
Sodium: 139 mmol/L (ref 135–145)
Total Bilirubin: 1.3 mg/dL — ABNORMAL HIGH (ref 0.0–1.2)
Total Protein: 6.2 g/dL — ABNORMAL LOW (ref 6.5–8.1)

## 2024-07-18 LAB — HEPARIN LEVEL (UNFRACTIONATED): Heparin Unfractionated: 0.48 [IU]/mL (ref 0.30–0.70)

## 2024-07-18 LAB — CBC
HCT: 37.5 % (ref 36.0–46.0)
Hemoglobin: 12.2 g/dL (ref 12.0–15.0)
MCH: 29.7 pg (ref 26.0–34.0)
MCHC: 32.5 g/dL (ref 30.0–36.0)
MCV: 91.2 fL (ref 80.0–100.0)
Platelets: 136 K/uL — ABNORMAL LOW (ref 150–400)
RBC: 4.11 MIL/uL (ref 3.87–5.11)
RDW: 15.9 % — ABNORMAL HIGH (ref 11.5–15.5)
WBC: 6.8 K/uL (ref 4.0–10.5)
nRBC: 0 % (ref 0.0–0.2)

## 2024-07-18 MED ORDER — VALPROATE SODIUM 100 MG/ML IV SOLN
250.0000 mg | Freq: Two times a day (BID) | INTRAVENOUS | Status: DC
  Administered 2024-07-19: 250 mg via INTRAVENOUS
  Filled 2024-07-18 (×4): qty 2.5

## 2024-07-18 MED ORDER — FENTANYL CITRATE (PF) 50 MCG/ML IJ SOSY
12.5000 ug | PREFILLED_SYRINGE | Freq: Once | INTRAMUSCULAR | Status: AC
  Administered 2024-07-18: 12.5 ug via INTRAVENOUS
  Filled 2024-07-18: qty 1

## 2024-07-18 MED ORDER — POTASSIUM CHLORIDE CRYS ER 20 MEQ PO TBCR
40.0000 meq | EXTENDED_RELEASE_TABLET | Freq: Two times a day (BID) | ORAL | Status: AC
  Administered 2024-07-18: 40 meq via ORAL
  Filled 2024-07-18 (×2): qty 2

## 2024-07-18 NOTE — Consult Note (Addendum)
 Palliative Care Consult Note                                  Date: 07/18/2024   Patient Name: Monica Mccoy  DOB: 1966/05/24  MRN: [id number]  Age / Sex: 58 y.o., female  PCP: Pcp, No Referring Physician: Caleen Qualia, MD  Reason for Consultation: Establishing goals of care  HPI/Patient Profile: 59 y.o. 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  She is admitted with sepsis secondary to acute pyelonephritis, bilateral PE, and AKI.  Patient was recently hospitalized in September 2025 for acute metabolic encephalopathy in the setting of hyponatremia and aspiration pneumonia.  She was also found to have acute PE and started on Eliquis .  Palliative Medicine has been consulted for goals of care discussions and complex medical decision making.   Clinical Assessment and Goals of Care:   Extensive chart review has been completed including labs, vital signs, imaging, progress/consult notes, orders, medications and available advance directive documents.    Patient assessed. She opens her eyes to voice and acknowledges my presence. She does not answer any questions, other than to say ok.   I spoke with her sisters Varnetta and [name] by phone to discuss diagnosis, prognosis, GOC, disposition, and options.  Patient is known to PMT from previous hospitalizations as well as outpatient palliative clinic at [facility]. I re-introduced Palliative Medicine as specialized medical care for people living with serious illness. It focuses on providing relief from the symptoms and stress of a serious illness.   Created space and opportunity for  family to express thoughts and feelings regarding current medical situation. Values and goals of care were attempted to be elicited.  Life Review: Patient is originally from [place] .  She relocated to [place]  in 2021.  She has a son and a daughter, however her sisters are her main family support.  Functional Status: Patient has resided at [facility] for approximately 3 months.  Per her sisters, at baseline she can ambulate with a walker with assistance.  Discussion: We discussed patient's current illness and what it means in the larger context of her ongoing co-morbidities. Current clinical status was reviewed. Natural disease trajectory of advanced cancer was discussed.  Her sisters state that patient's health has been on the decline for a while.  They understand that in the setting of recurrent PE,  patient is not a candidate for high risk vascular procedures due to her advanced cancer and poor underlying functional status.  Discussed the what ifs and encouraged family to consider what medical interventions patient would or would not want in the event her condition were to deteriorate, keeping in mind the concept of quality of life.    Discussed code status. I encouraged consideration of DNR/DNI status and provided education on evidence-based poor outcomes in similar hospitalized patients, as the cause of cardiac arrest would likely be associated with advanced illness rather than a reversible condition. Both sisters agree that DNR status is appropriate.   I discussed with sisters my recommendation to consider the addition of hospice support at SNF.  Discussed that it offers a holistic approach to care in the setting of end-stage  disease, and is about improving quality of life while allowing the natural course to occur.  I explained that hospice would be extra care for patient, communication with family, and assistance with symptom management when she further declines.  Sisters understand and will consider.  Discussed the importance of continued conversation with the medical team regarding overall plan of care and treatment options. Questions and concerns addressed.   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  Constitutional:      General: She is awake.     Comments: Chronically ill appearing  Pulmonary:     Effort: Pulmonary effort is normal.  Neurological:     Mental Status: She is lethargic and confused.  Psychiatric:        Speech: Speech is delayed.     Palliative Assessment/Data: PPS 30%     Assessment & Plan:   SUMMARY OF RECOMMENDATIONS   Code status to DNR with limited interventions Continue supportive care Sisters are considering the addition of hospice support at SNF - need a few days to think about it PMT will continue to follow  Primary Decision Maker: HCPOA  Existing Vynca/ACP Documentation: Patient has HCPOA document on file naming Sister Patrecia Molt as primary healthcare agent and sister [name] Ill as alternate  Symptom Management:  Per attending  Prognosis:  < 6 months would not be surprising  Discharge Planning:  To Be Determined    Thank you for allowing us  to participate in the care of Monica Mccoy   Time Total: 75 minutes  Detailed review of medical records (labs, imaging, vital signs), medically appropriate exam, discussed with treatment team, counseling and education to patient, family, & staff, documenting clinical information, coordination of care.   Signed by: Recardo Loll, NP Palliative Medicine Team  Team Phone # [phone]  For individual providers, please see  [facility]

## 2024-07-18 NOTE — Plan of Care (Signed)
  Problem: Clinical Measurements: Goal: Will remain free from infection Outcome: Progressing Goal: Respiratory complications will improve Outcome: Progressing   Problem: Coping: Goal: Level of anxiety will decrease Outcome: Progressing   Problem: Education: Goal: Knowledge of General Education information will improve Description: Including pain rating scale, medication(s)/side effects and non-pharmacologic comfort measures Outcome: Not Progressing   Problem: Health Behavior/Discharge Planning: Goal: Ability to manage health-related needs will improve Outcome: Not Progressing

## 2024-07-18 NOTE — Progress Notes (Signed)
 PHARMACY - PHYSICIAN COMMUNICATION CRITICAL VALUE ALERT - BLOOD CULTURE IDENTIFICATION (BCID)  Monica Mccoy is an 58 y.o. female who presented to [facility] on 07/16/2024 with AMS and sepsis concerns due to pyelonephritis.  Assessment:  1/4 blood cultures with staph spp (contaminant) -WBC 12.3, sCr 1.2 (bl~0.7), afebrile -Past Ucx: gram negative bugs w/ no R to cefepime   Name of physician (or Provider) Contacted: Dr. Sundil  Current antibiotics: Cefepime  2g IV every 8 hours + Vancomycin  1500mg  IV every 24 hours  Changes to prescribed antibiotics recommended:   -D/c vancomycin  -Continue cefepime  2g IV every 8 hours  -Follow up ability to de-escalate as patient improves vs cefepime  LOT  Results for orders placed or performed during the hospital encounter of 07/16/24  Blood Culture ID Panel (Reflexed) (Collected: 07/16/2024  8:10 PM)  Result Value Ref Range   Enterococcus faecalis NOT DETECTED NOT DETECTED   Enterococcus Faecium NOT DETECTED NOT DETECTED   Listeria monocytogenes NOT DETECTED NOT DETECTED   Staphylococcus species DETECTED (A) NOT DETECTED   Staphylococcus aureus (BCID) NOT DETECTED NOT DETECTED   Staphylococcus epidermidis NOT DETECTED NOT DETECTED   Staphylococcus lugdunensis NOT DETECTED NOT DETECTED   Streptococcus species NOT DETECTED NOT DETECTED   Streptococcus agalactiae NOT DETECTED NOT DETECTED   Streptococcus pneumoniae NOT DETECTED NOT DETECTED   Streptococcus pyogenes NOT DETECTED NOT DETECTED   A.calcoaceticus-baumannii NOT DETECTED NOT DETECTED   Bacteroides fragilis NOT DETECTED NOT DETECTED   Enterobacterales NOT DETECTED NOT DETECTED   Enterobacter cloacae complex NOT DETECTED NOT DETECTED   Escherichia coli NOT DETECTED NOT DETECTED   Klebsiella aerogenes NOT DETECTED NOT DETECTED   Klebsiella oxytoca NOT DETECTED NOT DETECTED   Klebsiella pneumoniae NOT DETECTED NOT DETECTED   Proteus species NOT DETECTED NOT DETECTED   Salmonella species  NOT DETECTED NOT DETECTED   Serratia marcescens NOT DETECTED NOT DETECTED   Haemophilus influenzae NOT DETECTED NOT DETECTED   Neisseria meningitidis NOT DETECTED NOT DETECTED   Pseudomonas aeruginosa NOT DETECTED NOT DETECTED   Stenotrophomonas maltophilia NOT DETECTED NOT DETECTED   Candida albicans NOT DETECTED NOT DETECTED   Candida auris NOT DETECTED NOT DETECTED   Candida glabrata NOT DETECTED NOT DETECTED   Candida krusei NOT DETECTED NOT DETECTED   Candida parapsilosis NOT DETECTED NOT DETECTED   Candida tropicalis NOT DETECTED NOT DETECTED   Cryptococcus neoformans/gattii NOT DETECTED NOT DETECTED    Lynwood Poplar, PharmD, BCPS Clinical Pharmacist 07/18/2024 3:29 AM

## 2024-07-18 NOTE — Progress Notes (Signed)
 PROGRESS NOTE    Monica Mccoy  FMW:[id number] DOB: 1966-07-21 DOA: 07/16/2024 PCP: Pcp, No   Brief Narrative:   Monica Mccoy is a 58 year old female past medical history of cognitive deficit, breast cancer, GERD, hypertension, hyperlipidemia, major depressive disorder, pulmonary embolism, DVT, history of breast cancer with brain metastasis, thrombocytopenia, transaminitis, chronic urinary tension, history of seizure, essential hypertension and chemotherapy induced neuropathy presented to emergency department as she has not been able to communicate for last 2 to 5 days and foul-smelling urine.  At the nursing home concern for sepsis due to borderline elevated temperature.  Patient has previous hospital admission in September 2025 for acute metabolic encephalopathy in the setting of hyponatremia and aspiration pneumonia.  She was also found to have acute pulmonary embolism and started on Eliquis . Per reporting, patient has not taken Eliquis  since October.  Reason unclear so far.  In the ED patient tachypneic, hypoxic and tachycardic.  UA with negative nitrite, leukocyte esterase, WBC 6-10.  CT shows left pyelonephritis.  Started on broad-spectrum antibiotics.  CT angiogram chest reveals bilateral PE with right heart strain.  Started on IV heparin .  11/22: Vital stable, remained on room air.  Echocardiogram with moderately reduced right ventricular function, normal pulmonary arterial pressure.  Patient is on valproic  acid which will interfere with Eliquis -starting neurology to switch her antiseizure medication to, ordered Eliquis .  Continuing heparin  for now.  Lengthy discussion with sisters as patient will be very high risk for any vascular procedure and thrombolysis due to her history of brain mets and brain surgeries, poor underlying functional status, advanced malignancy.  We will continue with anticoagulation and might have a separate discussion if patient decompensates.  Also involving  palliative care.  1/4 blood culture with Staphylococcus species-likely a contaminant.  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 -Will continue cefepime , 1/4 blood cultures with Staphylococcus species likely a contaminant. -CT chest with right lower lobe opacity likely infarction from PE.  Bilateral PE History of DVT PE - Diagnosed with DVT PE in September 2025, apparently patient has not been taking Eliquis  for about a month for unclear reasons. -CT angiogram shows bilateral PE with right heart strain. -Echocardiogram with moderately depressed RVF - Continue with heparin  for now-we will switch to Eliquis  once antiseizure medications were changed by neurology. - Vital signs are stable. 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  Per chart review from recent oncology visit 10/15 lack of evidence of metastasis.  Oncology recommending reinitiating anti-HER2 therapy and will evaluate CT CAP in 1 year and parotic MRI of the brain. -CT head today showed Left frontal and frontotemporal craniotomies with subjacent encephalomalacia.    Essential hypertension Blood pressure within goal Holding amlodipine  in the setting of sepsis    Acute kidney injury - Resolved with IV  fluid. - Monitor renal  function  Thrombocytopenia - Small improvement with platelets of 136 today.  Currently on heparin  drip.  Continue to monitor   Transaminitis Hepatic steatosis Cholelithiasis -Improving transaminitis and T. bili. -  CT abdomen pelvis showing fatty liver, cholelithiasis and no evidence of biliary ductal dilation. -Need to follow-up with hepatic function panel in 4 to 6 weeks in outpatient setting.   Prolonged QTc  prolongation -EKG showing sinus tachycardia heart rate 102 and prolonged QTc 635 ms. -Avoid QT prolonging medications.   History of seizure. Undetectable valproic  acid - Continue Depakote -for now - Consulting neurology for change of antiseizure medications to decrease the risk of interaction with Eliquis   Generalized anxiety disorder and depression - Continue Zoloft  25 mg daily   DVT prophylaxis:  IV heparin  gtts Code Status:  Full Code Diet: Heart healthy diet Disposition Plan:   Likely can go back to her facility in 1 to 2 days Consults: None indicated at this time Admission status:   Inpatient,    Subjective: Patient was resting comfortably when seen today.  Sister at bedside with concern of progressive weakness and lethargy. Sister was not sure why Eliquis  was discontinued, stating that per facility she was discharged from prior hospitalization with only 1 month of Eliquis ??  Objective: Vitals:   07/17/24 2218 07/18/24 0400 07/18/24 0800 07/18/24 0955  BP: (!) 132/105 (!) 146/99 (!) 123/92   Pulse: 92  85   Resp:   (!) 28 (!) 22  Temp: 98.8 F (37.1 C) 98.3 F (36.8 C) 99.2 F (37.3 C)   TempSrc: Axillary Axillary Axillary   SpO2: 95% 97% 95%   Weight:      Height:        Intake/Output Summary (Last 24 hours) at 07/18/2024 1324 Last data filed at 07/18/2024 0500 Gross per 24 hour  Intake 4007.67 ml  Output --  Net 4007.67 ml   Filed Weights   07/17/24 0000  Weight: (!) 142.9 kg    Examination: General.  Ill-appearing, obese lady, in no acute  distress. Pulmonary.  Coarse breath sounds bilaterally, normal respiratory effort. CV.  Regular rate and rhythm, no JVD, rub or murmur. Abdomen.  Soft, nontender, nondistended, BS positive. CNS.  Somnolent. Extremities.  No edema, no cyanosis, pulses intact and symmetrical.  Data Reviewed: I have personally reviewed following labs and imaging studies  CBC: Recent Labs  Lab 07/16/24 2017 07/16/24 2023 07/16/24 2024 07/18/24 0552  WBC 12.3*  --   --  6.8  NEUTROABS 8.5*  --   --   --   HGB 14.6 15.0 15.6* 12.2  HCT 44.2 44.0 46.0 37.5  MCV 91.1  --   --  91.2  PLT 117*  --   --  136*   Basic Metabolic Panel: Recent Labs  Lab 07/16/24 2017 07/16/24 2023 07/16/24 2024 07/18/24 0552  NA 140 140 141 139  K 3.6 3.5 3.5 3.2*  CL 101  --  104 104  CO2 24  --   --  23  GLUCOSE 129*  --  127* 106*  BUN 14  --  17 11  CREATININE 1.25*  --  1.20* 0.88  CALCIUM  8.8*  --   --  8.5*  MG 1.7  --   --   --    GFR: Estimated Creatinine Clearance: 103.2 mL/min (by C-G formula based on SCr of 0.88 mg/dL). Liver Function Tests: Recent Labs  Lab 07/16/24 2017 07/18/24 0552  AST 113* 57*  ALT  188* 103*  ALKPHOS 66 58  BILITOT 1.8* 1.3*  PROT 6.8 6.2*  ALBUMIN 2.6* 2.3*   No results for input(s): LIPASE, AMYLASE in the last 168 hours. Recent Labs  Lab 07/17/24 0222  AMMONIA <13   Coagulation Profile: Recent Labs  Lab 07/17/24 0222  INR 1.1   Cardiac Enzymes: No results for input(s): CKTOTAL, CKMB, CKMBINDEX, TROPONINI in the last 168 hours. BNP (last 3 results) No results for input(s): PROBNP in the last 8760 hours. HbA1C: No results for input(s): HGBA1C in the last 72 hours. CBG: No results for input(s): GLUCAP in the last 168 hours. Lipid Profile: No results for input(s): CHOL, HDL, LDLCALC, TRIG, CHOLHDL, LDLDIRECT in the last 72 hours. Thyroid  Function Tests: No results for input(s): TSH, T4TOTAL, FREET4, T3FREE,  THYROIDAB in the last 72 hours. Anemia Panel: No results for input(s): VITAMINB12, FOLATE, FERRITIN, TIBC, IRON, RETICCTPCT in the last 72 hours. Sepsis Labs: Recent Labs  Lab 07/16/24 2024 07/17/24 0012  LATICACIDVEN 1.4 <0.3*    Recent Results (from the past 240 hours)  Blood Culture (routine x 2)     Status: None (Preliminary result)   Collection Time: 07/16/24  8:10 PM   Specimen: BLOOD  Result Value Ref Range Status   Specimen Description BLOOD LEFT ANTECUBITAL  Final   Special Requests   Final    BOTTLES DRAWN AEROBIC AND ANAEROBIC Blood Culture results may not be optimal due to an inadequate volume of blood received in culture bottles   Culture  Setup Time   Final    GRAM POSITIVE COCCI ANAEROBIC BOTTLE ONLY R[facility]  [facility] 07/18/2024 @ 0317 BY DD Performed at [facility], [address] [place], [place] [address]    Culture GRAM POSITIVE COCCI  Final   Report Status PENDING  Incomplete  Blood Culture ID Panel (Reflexed)     Status: Abnormal   Collection Time: 07/16/24  8:10 PM  Result Value Ref Range Status   Enterococcus faecalis NOT DETECTED NOT DETECTED Final   Enterococcus Faecium NOT DETECTED NOT DETECTED Final   Listeria monocytogenes NOT DETECTED NOT DETECTED Final   Staphylococcus species DETECTED (A) NOT DETECTED Final    Comment: CRITICAL RESULT CALLED TO, READ BACK BY AND VERIFIED WITH: [facility]  [facility] 07/18/2024 @ 0317 BY DD    Staphylococcus aureus (BCID) NOT DETECTED NOT DETECTED Final   Staphylococcus epidermidis NOT DETECTED NOT DETECTED Final   Staphylococcus lugdunensis NOT DETECTED NOT DETECTED Final   Streptococcus species NOT DETECTED NOT DETECTED Final   Streptococcus agalactiae NOT DETECTED NOT DETECTED Final   Streptococcus pneumoniae NOT DETECTED NOT DETECTED Final   Streptococcus pyogenes NOT DETECTED NOT DETECTED Final   A.calcoaceticus-baumannii NOT DETECTED NOT DETECTED Final   Bacteroides fragilis NOT  DETECTED NOT DETECTED Final   Enterobacterales NOT DETECTED NOT DETECTED Final   Enterobacter cloacae complex NOT DETECTED NOT DETECTED Final   Escherichia coli NOT DETECTED NOT DETECTED Final   Klebsiella aerogenes NOT DETECTED NOT DETECTED Final   Klebsiella oxytoca NOT DETECTED NOT DETECTED Final   Klebsiella pneumoniae NOT DETECTED NOT DETECTED Final   Proteus species NOT DETECTED NOT DETECTED Final   Salmonella species NOT DETECTED NOT DETECTED Final   Serratia marcescens NOT DETECTED NOT DETECTED Final   Haemophilus influenzae NOT DETECTED NOT DETECTED Final   Neisseria meningitidis NOT DETECTED NOT DETECTED Final   Pseudomonas aeruginosa NOT DETECTED NOT DETECTED Final   Stenotrophomonas maltophilia NOT DETECTED NOT DETECTED Final  Candida albicans NOT DETECTED NOT DETECTED Final   Candida auris NOT DETECTED NOT DETECTED Final   Candida glabrata NOT DETECTED NOT DETECTED Final   Candida krusei NOT DETECTED NOT DETECTED Final   Candida parapsilosis NOT DETECTED NOT DETECTED Final   Candida tropicalis NOT DETECTED NOT DETECTED Final   Cryptococcus neoformans/gattii NOT DETECTED NOT DETECTED Final    Comment: Performed at [facility], [address] [place], [place] [address]  Blood Culture (routine x 2)     Status: None (Preliminary result)   Collection Time: 07/16/24  8:25 PM   Specimen: BLOOD  Result Value Ref Range Status   Specimen Description BLOOD RIGHT ANTECUBITAL  Final   Special Requests   Final    BOTTLES DRAWN AEROBIC AND ANAEROBIC Blood Culture adequate volume   Culture   Final    NO GROWTH 2 DAYS Performed at [facility], [address] [place], [place] [address]    Report Status PENDING  Incomplete  MRSA Next Gen by PCR, Nasal     Status: None   Collection Time: 07/17/24 12:05 AM   Specimen: Nasal Mucosa; Nasal Swab  Result Value Ref Range Status   MRSA by PCR Next Gen NOT DETECTED NOT DETECTED Final    Comment: (NOTE) The GeneXpert MRSA  Assay (FDA approved for NASAL specimens only), is one component of a comprehensive MRSA colonization surveillance program. It is not intended to diagnose MRSA infection nor to guide or monitor treatment for MRSA infections. Test performance is not FDA approved in patients less than 33 years old. Performed at [facility], [address], [place], [place] [address]     Radiology Studies:  CT Angio Chest PE W and/or Wo Contrast Addendum Date: 07/17/2024  ADDENDUM: The upper abdomen should mention cholelithiasis. Discussed with Dr. Midge by Dr. Margarite on the phone on 07/17/24 at 12:09pm. ---------------------------------------------------- Electronically signed by: Kate Margarite MD 07/17/2024 12:11 AM EST RP Workstation: [id number]   Result Date: 07/17/2024  CTA CHEST AORTA 07/16/2024 11:50:39 PM TECHNIQUE: CTA of the chest was performed after the administration of 75 mL of intravenous iohexol  (OMNIPAQUE ) 350 MG/ML injection. Multiplanar reformatted images are provided for review. MIP images are provided for review. Automated exposure control, iterative reconstruction, and/or weight based adjustment of the mA/kV was utilized to reduce the radiation dose to as low as reasonably achievable. COMPARISON: None available. CLINICAL HISTORY: Recently diagnosed with saddle PE. Eval for worsening PE burden. FINDINGS: AORTA: No thoracic aortic dissection. No aneurysm. MEDIASTINUM: Bilateral distal central left and right pulmonary emboli extending to the segmental and subsegmental levels of all 5 lobes. The main pulmonary artery is normal in caliber. Enlarged RV to LV ratio of 1.1. Associated straightening of the interventricular septum. The pericardium demonstrates no acute abnormality. No mediastinal lymphadenopathy. LYMPH NODES: No mediastinal, hilar or axillary lymphadenopathy. LUNGS AND PLEURA: Right lower lobe base airspace opacity. Elevated right hemidiaphragm. No definite pleural effusion or  pneumothorax. UPPER ABDOMEN: Nonspecific left perinephric fat stranding. SOFT TISSUES AND BONES: No acute bone or soft tissue abnormality. IMPRESSION: 1. Bilateral distal central left and right pulmonary emboli extending to the segmental and subsegmental levels of all 5 lobes with associated right heart strain. 2. Right lower lobe airspace opacity, which may represent pulmonary infarction versus atelectasis in the setting of an elevated diaphragm. . Electronically signed by: Morgane Naveau MD 07/17/2024 12:08 AM EST RP Workstation: [id number]   CT ABDOMEN PELVIS W CONTRAST Result Date: 07/16/2024 CLINICAL DATA:  Elevated LFT. EXAM: CT ABDOMEN AND PELVIS WITH CONTRAST TECHNIQUE: Multidetector CT imaging of the abdomen and pelvis was performed using the standard protocol following bolus administration of intravenous contrast. RADIATION DOSE REDUCTION: This exam was performed according to the departmental dose-optimization program which includes automated exposure control, adjustment of the mA and/or kV according to patient size and/or use of iterative reconstruction technique. CONTRAST:  75mL OMNIPAQUE  IOHEXOL  350 MG/ML SOLN COMPARISON:  CT abdomen and pelvis 06/08/2022 FINDINGS: Lower chest: There is atelectasis in the right lung base. Hepatobiliary: There is diffuse fatty infiltration of the liver. Gallstones are present. There is no biliary ductal dilatation. Pancreas: Unremarkable. No pancreatic ductal dilatation or surrounding inflammatory changes. Spleen: Normal in size without focal abnormality. Adrenals/Urinary Tract: There is limited evaluation of the kidneys secondary to patient's arm positioning. There are likely patchy areas of hypoattenuation throughout the left kidney which are ill-defined. There is mild left perinephric fat stranding. There is no hydronephrosis in either kidney. The right kidney and adrenal glands are within normal limits. The bladder is decompressed. Stomach/Bowel: Stomach is  within normal limits. Appendix appears normal. No evidence of bowel wall thickening, distention, or inflammatory changes.Sigmoid colon anastomosis is present. Vascular/Lymphatic: No significant vascular findings are present. No enlarged abdominal or pelvic lymph nodes. Reproductive: There is lobulated uterine. There are multiple calcifications likely related to fibroids measuring up to 3.8 cm. Adnexa are within normal limits. Other: No abdominal wall hernia or abnormality. No abdominopelvic ascites. Musculoskeletal: No fracture is seen. IMPRESSION: 1. Findings suspicious for left-sided pyelonephritis. 2. Cholelithiasis. 3. Fatty infiltration of the liver. 4. Fibroid uterus. Electronically Signed   By: Greig Pique M.D.   On: 07/16/2024 23:58   CT Head Wo Contrast Result Date: 07/16/2024 EXAM: CT HEAD WITHOUT CONTRAST 07/16/2024 08:46:38 PM TECHNIQUE: CT of the head was performed without the administration of intravenous contrast. Automated exposure control, iterative reconstruction, and/or weight based adjustment of the mA/kV was utilized to reduce the radiation dose to as low as reasonably achievable. COMPARISON: Prior MRI examination of 06/12/2022. CLINICAL HISTORY: increasing AMS. last outside MRI showed parietal lobe lesion increasing AMS. last outside MRI showed parietal lobe lesion FINDINGS: BRAIN AND VENTRICLES: Encephalomalacia is present subjacent to the craniotomy flaps. Parenchymal volume loss is commensurate with the patient's age. Periventricular white matter changes are present likely reflecting the sequela of small vessel ischemia. No acute hemorrhage. No evidence of acute infarct. No hydrocephalus. No extra-axial collection. No mass effect or midline shift. ORBITS: No acute abnormality. SINUSES: Fluid opacification of several inferior bilateral mastoid air cells is again identified, unchanged from prior MRI examination of 06/12/2022 without superimposed osseous erosion. SOFT TISSUES AND SKULL:  Left frontal and frontotemporal craniotomies are again identified. No acute soft tissue abnormality. No acute skull fracture. IMPRESSION: 1. No acute intracranial abnormality. 2. Left frontal and frontotemporal craniotomies with subjacent encephalomalacia. 3. Fluid opacification of several inferior bilateral mastoid air cells, unchanged from prior MRI, without superimposed osseous erosion. Electronically signed by: Dorethia Molt MD 07/16/2024 08:56 PM EST RP Workstation: [id number]   DG Chest Port 1 View Result Date: 07/16/2024 CLINICAL DATA:  Questionable sepsis. EXAM: PORTABLE CHEST 1 VIEW COMPARISON:  Chest radiograph dated 04/03/2024. FINDINGS: Stable positioning of the left Port-A-Cath. Shallow inspiration. No focal consolidation, pleural effusion, pneumothorax. Stable cardiac silhouette. No acute osseous pathology. IMPRESSION: No active disease. Electronically Signed   By: Vanetta Chou M.D.   On: 07/16/2024 19:40   Scheduled Meds:  Chlorhexidine  Gluconate Cloth  6 each Topical Daily  divalproex   250 mg Oral BID   pantoprazole   40 mg Oral Daily   potassium chloride   40 mEq Oral BID   sertraline   25 mg Oral Daily   sodium chloride  flush  3 mL Intravenous Q12H   sodium chloride  flush  3 mL Intravenous Q12H   thiamine   100 mg Oral Daily   Continuous Infusions:  ceFEPime  (MAXIPIME ) IV 2 g (07/18/24 1138)   heparin  1,700 Units/hr (07/18/24 1020)   lactated ringers  125 mL/hr at 07/18/24 1313   This record has been created using Conservation officer, historic buildings. Errors have been sought and corrected,but may not always be located. Such creation errors do not reflect on the standard of care.   Time spent.  50 minutes  Amaryllis Dare, MD [facility] 07/18/2024, 1:24 PM

## 2024-07-18 NOTE — Progress Notes (Signed)
 PHARMACY - ANTICOAGULATION CONSULT NOTE  Pharmacy Consult for heparin  Indication: pulmonary embolus  Allergies  Allergen Reactions   Dilaudid [Hydromorphone Hcl] Other (See Comments)    Confusion & hallucinations   Morphine And Codeine Other (See Comments)    Headaches     Patient Measurements: Height: 5' 6 (167.6 cm) Weight: (!) 142.9 kg (315 lb) IBW/kg (Calculated) : 59.3 HEPARIN  DW (KG): 94.8  Vital Signs: Temp: 99.2 F (37.3 C) (11/22 0800) Temp Source: Axillary (11/22 0800) BP: 123/92 (11/22 0800) Pulse Rate: 85 (11/22 0800)  Labs: Recent Labs    07/16/24 2017 07/16/24 2023 07/16/24 2024 07/17/24 0222 07/17/24 0542 07/17/24 0742 07/17/24 1000 07/17/24 1800 07/18/24 0552  HGB 14.6 15.0 15.6*  --   --   --   --   --  12.2  HCT 44.2 44.0 46.0  --   --   --   --   --  37.5  PLT 117*  --   --   --   --   --   --   --  136*  LABPROT  --   --   --  15.2  --   --   --   --   --   INR  --   --   --  1.1  --   --   --   --   --   HEPARINUNFRC  --   --   --   --   --   --  0.53 0.33 0.48  CREATININE 1.25*  --  1.20*  --   --   --   --   --  0.88  TROPONINIHS  --   --   --   --  72* 62*  --   --   --     Estimated Creatinine Clearance: 103.2 mL/min (by C-G formula based on SCr of 0.88 mg/dL).   Medical History: Past Medical History:  Diagnosis Date   Anxiety    Breast cancer metastasized to brain [facility])    Left breast   Chronic pain after cancer treatment    Depression    GERD (gastroesophageal reflux disease)    History of cancer chemotherapy    Breast cancer left breast   Seizure [facility])     Assessment: 39 yoF presented with AMS. Pharmacy consulted to dose heparin  for pulmonary embolism. PMH includes breast cancer metastasized to the brain, recent hospital admission for AMS/slurred speech found to have bilat PE with no RHS in 04/2024.   -CTA: Bilateral pulmonary emboli extending to all 5 lobes with RHS -Hgb 15s, plts 117 -Seen 9/9 for acute saddle PE  (no RHS on CT chest) and started on Eliquis  (has been off for several days-last dose at facility was given 06/17/2024)  Heparin  level this morning is therapeutic on heparin  drip at 1700 units/hr.  No overt bleeding or complications noted.   Goal of Therapy:  Heparin  level 0.3-0.7 units/ml Monitor platelets by anticoagulation protocol: Yes   Plan:  Increase heparin  gtt to 1700 units/hr to keep at goal Daily heparin  level and CBC. F/u transition to oral anticoagulation, of note pt on valproic  acid which interacts with DOACs.  Harlene Barlow, [employer], [facility] Clinical Pharmacist  07/18/2024 10:51 AM   [facility] pharmacy phone numbers are listed on [email or web address]

## 2024-07-19 DIAGNOSIS — A419 Sepsis, unspecified organism: Secondary | ICD-10-CM | POA: Diagnosis not present

## 2024-07-19 DIAGNOSIS — R4182 Altered mental status, unspecified: Secondary | ICD-10-CM | POA: Diagnosis not present

## 2024-07-19 DIAGNOSIS — N10 Acute tubulo-interstitial nephritis: Secondary | ICD-10-CM | POA: Diagnosis not present

## 2024-07-19 DIAGNOSIS — I2699 Other pulmonary embolism without acute cor pulmonale: Secondary | ICD-10-CM | POA: Diagnosis not present

## 2024-07-19 LAB — CBC
HCT: 36.8 % (ref 36.0–46.0)
Hemoglobin: 12.3 g/dL (ref 12.0–15.0)
MCH: 29.9 pg (ref 26.0–34.0)
MCHC: 33.4 g/dL (ref 30.0–36.0)
MCV: 89.5 fL (ref 80.0–100.0)
Platelets: 180 K/uL (ref 150–400)
RBC: 4.11 MIL/uL (ref 3.87–5.11)
RDW: 15.5 % (ref 11.5–15.5)
WBC: 6.9 K/uL (ref 4.0–10.5)
nRBC: 0 % (ref 0.0–0.2)

## 2024-07-19 LAB — HEPARIN LEVEL (UNFRACTIONATED)
Heparin Unfractionated: 0.89 [IU]/mL — ABNORMAL HIGH (ref 0.30–0.70)
Heparin Unfractionated: 0.92 [IU]/mL — ABNORMAL HIGH (ref 0.30–0.70)
Heparin Unfractionated: 0.73 [IU]/mL — ABNORMAL HIGH (ref 0.30–0.70)

## 2024-07-19 MED ORDER — ALBUTEROL SULFATE (2.5 MG/3ML) 0.083% IN NEBU
2.5000 mg | INHALATION_SOLUTION | Freq: Four times a day (QID) | RESPIRATORY_TRACT | Status: DC | PRN
  Administered 2024-07-19: 2.5 mg via RESPIRATORY_TRACT
  Filled 2024-07-19: qty 3

## 2024-07-19 MED ORDER — LEVETIRACETAM 500 MG PO TABS
500.0000 mg | ORAL_TABLET | Freq: Two times a day (BID) | ORAL | Status: DC
  Administered 2024-07-19 – 2024-07-20 (×3): 500 mg via ORAL
  Filled 2024-07-19 (×3): qty 1

## 2024-07-19 NOTE — Progress Notes (Signed)
 PROGRESS NOTE    Monica Mccoy  FMW:[id number] DOB: 02/06/1966 DOA: 07/16/2024 PCP: Pcp, No   Brief Narrative:   Monica Mccoy is a 58 year old female past medical history of cognitive deficit, breast cancer, GERD, hypertension, hyperlipidemia, major depressive disorder, pulmonary embolism, DVT, history of breast cancer with brain metastasis, thrombocytopenia, transaminitis, chronic urinary tension, history of seizure, essential hypertension and chemotherapy induced neuropathy presented to emergency department as she has not been able to communicate for last 2 to 5 days and foul-smelling urine.  At the nursing home concern for sepsis due to borderline elevated temperature.  Patient has previous hospital admission in September 2025 for acute metabolic encephalopathy in the setting of hyponatremia and aspiration pneumonia.  She was also found to have acute pulmonary embolism and started on Eliquis . Per reporting, patient has not taken Eliquis  since October.  Reason unclear so far.  In the ED patient tachypneic, hypoxic and tachycardic.  UA with negative nitrite, leukocyte esterase, WBC 6-10.  CT shows left pyelonephritis.  Started on broad-spectrum antibiotics.  CT angiogram chest reveals bilateral PE with right heart strain.  Started on IV heparin .  11/22: Vital stable, remained on room air.  Echocardiogram with moderately reduced right ventricular function, normal pulmonary arterial pressure.  Patient is on valproic  acid which will interfere with Eliquis -starting neurology to switch her antiseizure medication to, ordered Eliquis .  Continuing heparin  for now.  Lengthy discussion with sisters as patient will be very high risk for any vascular procedure and thrombolysis due to her history of brain mets and brain surgeries, poor underlying functional status, advanced malignancy.  We will continue with anticoagulation and might have a separate discussion if patient decompensates.  Also involving  palliative care.  1/4 blood culture with Staphylococcus species-likely a contaminant.  11/23: Vital stable, remained on room air.  Switching valproic  acid with Keppra  and if she remained stable we will switch to Eliquis .  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 -Will continue cefepime , 1/4 blood cultures with Staphylococcus species likely a contaminant. -CT chest with right lower lobe opacity likely infarction from PE. - Continue cefepime  to complete a 5-day course  Bilateral PE History of DVT PE - Diagnosed with DVT PE in September 2025, apparently patient has not been taking Eliquis  for about a month for unclear reasons. -CT angiogram shows bilateral PE with right heart strain. -Echocardiogram with moderately depressed RVF - Continue with heparin  for now-we will switch to Eliquis  in a day or so if remains stable on Keppra  - Vital signs are stable. 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  Per chart review from recent oncology visit 10/15 lack of evidence of metastasis.  Oncology recommending reinitiating anti-HER2 therapy and will evaluate CT CAP in 1 year and parotic MRI of the brain. -CT head today  showed Left frontal and frontotemporal craniotomies with subjacent  encephalomalacia.    Essential hypertension Blood pressure within goal Holding amlodipine  in the setting of sepsis    Acute kidney injury - Resolved with IV fluid. - Monitor renal function  Thrombocytopenia - Small improvement with platelets at 136.  Currently on heparin  drip.  Continue to monitor   Transaminitis Hepatic steatosis Cholelithiasis -Improving transaminitis and T. bili. -  CT abdomen pelvis showing fatty liver, cholelithiasis and no evidence of biliary ductal dilation. -Need to follow-up with hepatic function panel in 4 to 6 weeks in outpatient setting.   Prolonged QTc  prolongation -EKG showing sinus tachycardia heart rate 102 and prolonged QTc 635 ms. -Avoid QT prolonging medications.   History of seizure. Undetectable valproic  acid - Switching valproic  acid with Keppra  - Continue to monitor  Generalized anxiety disorder and depression - Continue Zoloft  25 mg daily   DVT prophylaxis:  IV heparin  gtts Code Status: DNR, no escalation of care, continue current level Diet: Heart healthy diet Disposition Plan:   Likely can go back to her facility in 1 to 2 days Consults: None indicated at this time Admission status:   Inpatient,    Subjective: Patient was seen and examined today.  She was more awake, denies any pain or shortness of breath.  Objective: Vitals:   07/19/24 0039 07/19/24 0355 07/19/24 0816 07/19/24 1230  BP: (!) 139/99 (!) 123/96 (!) 125/95 118/88  Pulse:  85 83 89  Resp: (!) 22 17 20 20   Temp: 98.7 F (37.1 C) 98.2 F (36.8 C) 98.1 F (36.7 C) 98 F (36.7 C)  TempSrc: Oral Oral Oral Oral  SpO2: 95% 92% 97% 98%  Weight:      Height:        Intake/Output Summary (Last 24 hours) at 07/19/2024 1313 Last data filed at 07/19/2024 1034 Gross per 24 hour  Intake 2340.6 ml  Output 800 ml  Net 1540.6 ml   Filed Weights   07/17/24 0000  Weight: (!) 142.9 kg    Examination: General.  Obese and frail lady, in no acute  distress. Pulmonary.  Lungs clear bilaterally, normal respiratory effort. CV.  Regular rate and rhythm, no JVD, rub or murmur. Abdomen.  Soft, nontender, nondistended, BS positive. CNS.  Alert and oriented .  No focal neurologic deficit. Extremities.  No edema, no cyanosis, pulses intact and symmetrical.  Data Reviewed: I have personally reviewed following labs and imaging studies  CBC: Recent Labs  Lab 07/16/24 2017 07/16/24 2023 07/16/24 2024 07/18/24 0552  WBC 12.3*  --   --  6.8  NEUTROABS 8.5*  --   --   --   HGB 14.6 15.0 15.6* 12.2  HCT 44.2 44.0 46.0 37.5  MCV 91.1  --   --  91.2  PLT 117*  --   --  136*   Basic Metabolic Panel: Recent Labs  Lab 07/16/24 2017 07/16/24 2023 07/16/24 2024 07/18/24 0552  NA 140 140 141 139  K 3.6 3.5 3.5 3.2*  CL 101  --  104 104  CO2 24  --   --  23  GLUCOSE 129*  --  127* 106*  BUN 14  --  17 11  CREATININE 1.25*  --  1.20* 0.88  CALCIUM  8.8*  --   --  8.5*  MG 1.7  --   --   --    GFR: Estimated Creatinine Clearance: 103.2 mL/min (by C-G formula based on SCr of 0.88 mg/dL). Liver  Function Tests: Recent Labs  Lab 07/16/24 2017 07/18/24 0552  AST 113* 57*  ALT 188* 103*  ALKPHOS 66 58  BILITOT 1.8* 1.3*  PROT 6.8 6.2*  ALBUMIN 2.6* 2.3*   No results for input(s): LIPASE, AMYLASE in the last 168 hours. Recent Labs  Lab 07/17/24 0222  AMMONIA <13   Coagulation Profile: Recent Labs  Lab 07/17/24 0222  INR 1.1   Cardiac Enzymes: No results for input(s): CKTOTAL, CKMB, CKMBINDEX, TROPONINI in the last 168 hours. BNP (last 3 results) No results for input(s): PROBNP in the last 8760 hours. HbA1C: No results for input(s): HGBA1C in the last 72 hours. CBG: No results for input(s): GLUCAP in the last 168 hours. Lipid Profile: No results for input(s): CHOL, HDL, LDLCALC, TRIG, CHOLHDL, LDLDIRECT in the last 72 hours. Thyroid  Function Tests: No results for input(s): TSH, T4TOTAL,  FREET4, T3FREE, THYROIDAB in the last 72 hours. Anemia Panel: No results for input(s): VITAMINB12, FOLATE, FERRITIN, TIBC, IRON, RETICCTPCT in the last 72 hours. Sepsis Labs: Recent Labs  Lab 07/16/24 2024 07/17/24 0012  LATICACIDVEN 1.4 <0.3*    Recent Results (from the past 240 hours)  Blood Culture (routine x 2)     Status: None (Preliminary result)   Collection Time: 07/16/24  8:10 PM   Specimen: BLOOD  Result Value Ref Range Status   Specimen Description BLOOD LEFT ANTECUBITAL  Final   Special Requests   Final    BOTTLES DRAWN AEROBIC AND ANAEROBIC Blood Culture results may not be optimal due to an inadequate volume of blood received in culture bottles   Culture  Setup Time   Final    GRAM POSITIVE COCCI ANAEROBIC BOTTLE ONLY RBV  [facility] 07/18/2024 @ 0317 BY DD Performed at [facility], [address] [place], [place] [address]    Culture GRAM POSITIVE COCCI  Final   Report Status PENDING  Incomplete  Blood Culture ID Panel (Reflexed)     Status: Abnormal   Collection Time: 07/16/24  8:10 PM  Result Value Ref Range Status   Enterococcus faecalis NOT DETECTED NOT DETECTED Final   Enterococcus Faecium NOT DETECTED NOT DETECTED Final   Listeria monocytogenes NOT DETECTED NOT DETECTED Final   Staphylococcus species DETECTED (A) NOT DETECTED Final    Comment: CRITICAL RESULT CALLED TO, READ BACK BY AND VERIFIED WITH: [facility]  [facility] 07/18/2024 @ 0317 BY DD    Staphylococcus aureus (BCID) NOT DETECTED NOT DETECTED Final   Staphylococcus epidermidis NOT DETECTED NOT DETECTED Final   Staphylococcus lugdunensis NOT DETECTED NOT DETECTED Final   Streptococcus species NOT DETECTED NOT DETECTED Final   Streptococcus agalactiae NOT DETECTED NOT DETECTED Final   Streptococcus pneumoniae NOT DETECTED NOT DETECTED Final   Streptococcus pyogenes NOT DETECTED NOT DETECTED Final   A.calcoaceticus-baumannii NOT DETECTED NOT DETECTED Final    Bacteroides fragilis NOT DETECTED NOT DETECTED Final   Enterobacterales NOT DETECTED NOT DETECTED Final   Enterobacter cloacae complex NOT DETECTED NOT DETECTED Final   Escherichia coli NOT DETECTED NOT DETECTED Final   Klebsiella aerogenes NOT DETECTED NOT DETECTED Final   Klebsiella oxytoca NOT DETECTED NOT DETECTED Final   Klebsiella pneumoniae NOT DETECTED NOT DETECTED Final   Proteus species NOT DETECTED NOT DETECTED Final   Salmonella species NOT DETECTED NOT DETECTED Final   Serratia marcescens NOT DETECTED NOT DETECTED Final   Haemophilus influenzae NOT DETECTED NOT DETECTED Final   Neisseria meningitidis NOT DETECTED NOT DETECTED Final   Pseudomonas  aeruginosa NOT DETECTED NOT DETECTED Final   Stenotrophomonas maltophilia NOT DETECTED NOT DETECTED Final   Candida albicans NOT DETECTED NOT DETECTED Final   Candida auris NOT DETECTED NOT DETECTED Final   Candida glabrata NOT DETECTED NOT DETECTED Final   Candida krusei NOT DETECTED NOT DETECTED Final   Candida parapsilosis NOT DETECTED NOT DETECTED Final   Candida tropicalis NOT DETECTED NOT DETECTED Final   Cryptococcus neoformans/gattii NOT DETECTED NOT DETECTED Final    Comment: Performed at [facility], [address], [place], [place] [address]  Blood Culture (routine x 2)     Status: None (Preliminary result)   Collection Time: 07/16/24  8:25 PM   Specimen: BLOOD  Result Value Ref Range Status   Specimen Description BLOOD RIGHT ANTECUBITAL  Final   Special Requests   Final    BOTTLES DRAWN AEROBIC AND ANAEROBIC Blood Culture adequate volume   Culture   Final    NO GROWTH 2 DAYS Performed at [facility], [address], [place], [place] [address]    Report Status PENDING  Incomplete  MRSA Next Gen by PCR, Nasal     Status: None   Collection Time: 07/17/24 12:05 AM   Specimen: Nasal Mucosa; Nasal Swab  Result Value Ref Range Status   MRSA by PCR Next Gen NOT DETECTED NOT DETECTED Final    Comment:  (NOTE) The GeneXpert MRSA Assay (FDA approved for NASAL specimens only), is one component of a comprehensive MRSA colonization surveillance program. It is not intended to diagnose MRSA infection nor to guide or monitor treatment for MRSA infections. Test performance is not FDA approved in patients less than 46 years old. Performed at [facility], [address] [place], [place] [address]     Radiology Studies:  CT Angio Chest PE W and/or Wo Contrast Addendum Date: 07/17/2024  ADDENDUM: The upper abdomen should mention cholelithiasis. Discussed with Dr. Midge by Dr. Margarite on the phone on 07/17/24 at 12:09pm. ---------------------------------------------------- Electronically signed by: Kate Margarite MD 07/17/2024 12:11 AM EST RP Workstation: [id number]   Result Date: 07/17/2024  CTA CHEST AORTA 07/16/2024 11:50:39 PM TECHNIQUE: CTA of the chest was performed after the administration of 75 mL of intravenous iohexol  (OMNIPAQUE ) 350 MG/ML injection. Multiplanar reformatted images are provided for review. MIP images are provided for review. Automated exposure control, iterative reconstruction, and/or weight based adjustment of the mA/kV was utilized to reduce the radiation dose to as low as reasonably achievable. COMPARISON: None available. CLINICAL HISTORY: Recently diagnosed with saddle PE. Eval for worsening PE burden. FINDINGS: AORTA: No thoracic aortic dissection. No aneurysm. MEDIASTINUM: Bilateral distal central left and right pulmonary emboli extending to the segmental and subsegmental levels of all 5 lobes. The main pulmonary artery is normal in caliber. Enlarged RV to LV ratio of 1.1. Associated straightening of the interventricular septum. The pericardium demonstrates no acute abnormality. No mediastinal lymphadenopathy. LYMPH NODES: No mediastinal, hilar or axillary lymphadenopathy. LUNGS AND PLEURA: Right lower lobe base airspace opacity. Elevated right hemidiaphragm. No  definite pleural effusion or pneumothorax. UPPER ABDOMEN: Nonspecific left perinephric fat stranding. SOFT TISSUES AND BONES: No acute bone or soft tissue abnormality. IMPRESSION: 1. Bilateral distal central left and right pulmonary emboli extending to the segmental and subsegmental levels of all 5 lobes with associated right heart strain. 2. Right lower lobe airspace opacity, which may represent pulmonary infarction versus atelectasis in the setting of an elevated diaphragm. . Electronically signed by: Morgane Naveau MD 07/17/2024 12:08  AM EST RP Workstation: [id number]   CT ABDOMEN PELVIS W CONTRAST Result Date: 07/16/2024 CLINICAL DATA:  Elevated LFT. EXAM: CT ABDOMEN AND PELVIS WITH CONTRAST TECHNIQUE: Multidetector CT imaging of the abdomen and pelvis was performed using the standard protocol following bolus administration of intravenous contrast. RADIATION DOSE REDUCTION: This exam was performed according to the departmental dose-optimization program which includes automated exposure control, adjustment of the mA and/or kV according to patient size and/or use of iterative reconstruction technique. CONTRAST:  75mL OMNIPAQUE  IOHEXOL  350 MG/ML SOLN COMPARISON:  CT abdomen and pelvis 06/08/2022 FINDINGS: Lower chest: There is atelectasis in the right lung base. Hepatobiliary: There is diffuse fatty infiltration of the liver. Gallstones are present. There is no biliary ductal dilatation. Pancreas: Unremarkable. No pancreatic ductal dilatation or surrounding inflammatory changes. Spleen: Normal in size without focal abnormality. Adrenals/Urinary Tract: There is limited evaluation of the kidneys secondary to patient's arm positioning. There are likely patchy areas of hypoattenuation throughout the left kidney which are ill-defined. There is mild left perinephric fat stranding. There is no hydronephrosis in either kidney. The right kidney and adrenal glands are within normal limits. The bladder is decompressed.  Stomach/Bowel: Stomach is within normal limits. Appendix appears normal. No evidence of bowel wall thickening, distention, or inflammatory changes.Sigmoid colon anastomosis is present. Vascular/Lymphatic: No significant vascular findings are present. No enlarged abdominal or pelvic lymph nodes. Reproductive: There is lobulated uterine. There are multiple calcifications likely related to fibroids measuring up to 3.8 cm. Adnexa are within normal limits. Other: No abdominal wall hernia or abnormality. No abdominopelvic ascites. Musculoskeletal: No fracture is seen. IMPRESSION: 1. Findings suspicious for left-sided pyelonephritis. 2. Cholelithiasis. 3. Fatty infiltration of the liver. 4. Fibroid uterus. Electronically Signed   By: Greig Pique M.D.   On: 07/16/2024 23:58   CT Head Wo Contrast Result Date: 07/16/2024 EXAM: CT HEAD WITHOUT CONTRAST 07/16/2024 08:46:38 PM TECHNIQUE: CT of the head was performed without the administration of intravenous contrast. Automated exposure control, iterative reconstruction, and/or weight based adjustment of the mA/kV was utilized to reduce the radiation dose to as low as reasonably achievable. COMPARISON: Prior MRI examination of 06/12/2022. CLINICAL HISTORY: increasing AMS. last outside MRI showed parietal lobe lesion increasing AMS. last outside MRI showed parietal lobe lesion FINDINGS: BRAIN AND VENTRICLES: Encephalomalacia is present subjacent to the craniotomy flaps. Parenchymal volume loss is commensurate with the patient's age. Periventricular white matter changes are present likely reflecting the sequela of small vessel ischemia. No acute hemorrhage. No evidence of acute infarct. No hydrocephalus. No extra-axial collection. No mass effect or midline shift. ORBITS: No acute abnormality. SINUSES: Fluid opacification of several inferior bilateral mastoid air cells is again identified, unchanged from prior MRI examination of 06/12/2022 without superimposed osseous erosion.  SOFT TISSUES AND SKULL: Left frontal and frontotemporal craniotomies are again identified. No acute soft tissue abnormality. No acute skull fracture. IMPRESSION: 1. No acute intracranial abnormality. 2. Left frontal and frontotemporal craniotomies with subjacent encephalomalacia. 3. Fluid opacification of several inferior bilateral mastoid air cells, unchanged from prior MRI, without superimposed osseous erosion. Electronically signed by: Dorethia Molt MD 07/16/2024 08:56 PM EST RP Workstation: [id number]   DG Chest Port 1 View Result Date: 07/16/2024 CLINICAL DATA:  Questionable sepsis. EXAM: PORTABLE CHEST 1 VIEW COMPARISON:  Chest radiograph dated 04/03/2024. FINDINGS: Stable positioning of the left Port-A-Cath. Shallow inspiration. No focal consolidation, pleural effusion, pneumothorax. Stable cardiac silhouette. No acute osseous pathology. IMPRESSION: No active disease. Electronically Signed   By: Vanetta Shelia HERO.D.  On: 07/16/2024 19:40   Scheduled Meds:  Chlorhexidine  Gluconate Cloth  6 each Topical Daily   levETIRAcetam   500 mg Oral BID   pantoprazole   40 mg Oral Daily   sertraline   25 mg Oral Daily   sodium chloride  flush  3 mL Intravenous Q12H   sodium chloride  flush  3 mL Intravenous Q12H   thiamine   100 mg Oral Daily   Continuous Infusions:  ceFEPime  (MAXIPIME ) IV 2 g (07/19/24 1249)   heparin  1,600 Units/hr (07/19/24 0934)   This record has been created using Dragon voice recognition software. Errors have been sought and corrected,but may not always be located. Such creation errors do not reflect on the standard of care.   Time spent.  50 minutes  Amaryllis Dare, MD [facility] 07/19/2024, 1:13 PM

## 2024-07-19 NOTE — Progress Notes (Signed)
 PHARMACY - ANTICOAGULATION CONSULT NOTE  Pharmacy Consult for heparin  Indication: pulmonary embolus  Allergies  Allergen Reactions   Dilaudid [Hydromorphone Hcl] Other (See Comments)    Confusion & hallucinations   Morphine And Codeine Other (See Comments)    Headaches     Patient Measurements: Height: 5' 6 (167.6 cm) Weight: (!) 142.9 kg (315 lb) IBW/kg (Calculated) : 59.3 HEPARIN  DW (KG): 94.8  Vital Signs: Temp: 98.1 F (36.7 C) (11/23 0816) Temp Source: Oral (11/23 0816) BP: 125/95 (11/23 0816) Pulse Rate: 83 (11/23 0816)  Labs: Recent Labs    07/16/24 2017 07/16/24 2023 07/16/24 2024 07/17/24 0222 07/17/24 0542 07/17/24 0742 07/17/24 1000 07/17/24 1800 07/18/24 0552 07/19/24 0422  HGB 14.6 15.0 15.6*  --   --   --   --   --  12.2  --   HCT 44.2 44.0 46.0  --   --   --   --   --  37.5  --   PLT 117*  --   --   --   --   --   --   --  136*  --   LABPROT  --   --   --  15.2  --   --   --   --   --   --   INR  --   --   --  1.1  --   --   --   --   --   --   HEPARINUNFRC  --   --   --   --   --   --    < > 0.33 0.48 0.89*  CREATININE 1.25*  --  1.20*  --   --   --   --   --  0.88  --   TROPONINIHS  --   --   --   --  72* 62*  --   --   --   --    < > = values in this interval not displayed.    Estimated Creatinine Clearance: 103.2 mL/min (by C-G formula based on SCr of 0.88 mg/dL).   Medical History: Past Medical History:  Diagnosis Date   Anxiety    Breast cancer metastasized to brain [facility])    Left breast   Chronic pain after cancer treatment    Depression    GERD (gastroesophageal reflux disease)    History of cancer chemotherapy    Breast cancer left breast   Seizure [facility])     Assessment: 39 yoF presented with AMS. Pharmacy consulted to dose heparin  for pulmonary embolism. PMH includes breast cancer metastasized to the brain, recent hospital admission for AMS/slurred speech found to have bilat PE with no RHS in 04/2024.   -CTA: Bilateral  pulmonary emboli extending to all 5 lobes with RHS -Hgb 15s, plts 117 -Seen 9/9 for acute saddle PE (no RHS on CT chest) and started on Eliquis  (has been off for several days-last dose at facility was given 06/17/2024)  Heparin  level this morning is above goal on heparin  drip at 1700 units/hr.  No overt bleeding or complications noted.  CBC pending for today.  Goal of Therapy:  Heparin  level 0.3-0.7 units/ml Monitor platelets by anticoagulation protocol: Yes   Plan:  Decrease heparin  gtt to 1600 units/hr Repeat HL in 6 hrs. Daily heparin  level and CBC. F/u transition to oral anticoagulation, of note pt on valproic  acid which interacts with DOACs.  Harlene Denna Berdine JONETTA ARABELLA, [facility] Clinical Pharmacist  07/19/2024 8:41 AM   [facility] pharmacy phone numbers are listed on [email or web address]

## 2024-07-19 NOTE — Progress Notes (Signed)
 PHARMACY - ANTICOAGULATION CONSULT NOTE  Pharmacy Consult for heparin  Indication: pulmonary embolus  Labs: Recent Labs    07/17/24 0222 07/17/24 0542 07/17/24 0742 07/17/24 1000 07/18/24 0552 07/19/24 0422 07/19/24 1558 07/19/24 2227  HGB  --   --   --   --  12.2  --  12.3  --   HCT  --   --   --   --  37.5  --  36.8  --   PLT  --   --   --   --  136*  --  180  --   LABPROT 15.2  --   --   --   --   --   --   --   INR 1.1  --   --   --   --   --   --   --   HEPARINUNFRC  --   --   --    < > 0.48 0.89* 0.92* 0.73*  CREATININE  --   --   --   --  0.88  --   --   --   TROPONINIHS  --  72* 62*  --   --   --   --   --    < > = values in this interval not displayed.   Assessment: 58yo female remains slightly supratherapeutic on heparin  after rate change; no infusion issues or signs of bleeding per RN.  Goal of Therapy:  Heparin  level 0.3-0.7 units/ml   Plan:  Decrease heparin  infusion by 1 unit/kgABW/hr to 1300 units/hr. Check level with am labs.   Marvetta Dauphin, PharmD, BCPS 07/19/2024 11:01 PM

## 2024-07-19 NOTE — Plan of Care (Signed)
 Disoriented and communicate less but able to follow every commands. Sister came to visit her during the day time and looks happy when being with the sister.  Problem: Education: Goal: Knowledge of General Education information will improve Description: Including pain rating scale, medication(s)/side effects and non-pharmacologic comfort measures Outcome: Progressing   Problem: Health Behavior/Discharge Planning: Goal: Ability to manage health-related needs will improve Outcome: Progressing   Problem: Clinical Measurements: Goal: Ability to maintain clinical measurements within normal limits will improve Outcome: Progressing   Problem: Coping: Goal: Level of anxiety will decrease Outcome: Progressing   Problem: Nutrition: Goal: Adequate nutrition will be maintained Outcome: Progressing   Problem: Activity: Goal: Risk for activity intolerance will decrease Outcome: Progressing   Problem: Pain Managment: Goal: General experience of comfort will improve and/or be controlled Outcome: Progressing

## 2024-07-19 NOTE — Progress Notes (Signed)
 PHARMACY - ANTICOAGULATION CONSULT NOTE  Pharmacy Consult for heparin  Indication: pulmonary embolus  Allergies  Allergen Reactions   Dilaudid [Hydromorphone Hcl] Other (See Comments)    Confusion & hallucinations   Morphine And Codeine Other (See Comments)    Headaches     Patient Measurements: Height: 5' 6 (167.6 cm) Weight: (!) 142.9 kg (315 lb) IBW/kg (Calculated) : 59.3 HEPARIN  DW (KG): 94.8  Vital Signs: Temp: 98 F (36.7 C) (11/23 1230) Temp Source: Oral (11/23 1230) BP: 118/88 (11/23 1230) Pulse Rate: 89 (11/23 1230)  Labs: Recent Labs    07/16/24 2017 07/16/24 2023 07/16/24 2024 07/17/24 0222 07/17/24 0542 07/17/24 0742 07/17/24 1000 07/18/24 0552 07/19/24 0422 07/19/24 1558  HGB 14.6   < > 15.6*  --   --   --   --  12.2  --  12.3  HCT 44.2   < > 46.0  --   --   --   --  37.5  --  36.8  PLT 117*  --   --   --   --   --   --  136*  --  180  LABPROT  --   --   --  15.2  --   --   --   --   --   --   INR  --   --   --  1.1  --   --   --   --   --   --   HEPARINUNFRC  --   --   --   --   --   --    < > 0.48 0.89* 0.92*  CREATININE 1.25*  --  1.20*  --   --   --   --  0.88  --   --   TROPONINIHS  --   --   --   --  72* 62*  --   --   --   --    < > = values in this interval not displayed.    Estimated Creatinine Clearance: 103.2 mL/min (by C-G formula based on SCr of 0.88 mg/dL).   Medical History: Past Medical History:  Diagnosis Date   Anxiety    Breast cancer metastasized to brain [facility])    Left breast   Chronic pain after cancer treatment    Depression    GERD (gastroesophageal reflux disease)    History of cancer chemotherapy    Breast cancer left breast   Seizure [facility])     Assessment: 76 yoF presented with AMS. Pharmacy consulted to dose heparin  for pulmonary embolism. PMH includes breast cancer metastasized to the brain, recent hospital admission for AMS/slurred speech found to have bilat PE with no RHS in 04/2024.   -CTA: Bilateral  pulmonary emboli extending to all 5 lobes with RHS -Hgb 15s, plts 117 -Seen 9/9 for acute saddle PE (no RHS on CT chest) and started on Eliquis  (has been off for several days-last dose at facility was given 06/17/2024)  Heparin  level this morning is above goal on heparin  drip at 1700 units/hr.  No overt bleeding or complications noted.  CBC pending for today.  PM update: heparin  level remains supratherapeutic (0.92) and increasing despite rate decrease to 1600 units/hr. Verified with RN that level was drawn peripherally and heparin  is running through port.  Goal of Therapy:  Heparin  level 0.3-0.7 units/ml Monitor platelets by anticoagulation protocol: Yes   Plan:  Decrease heparin  gtt to 1400 units/hr Repeat HL in 6  hrs. Daily heparin  level and CBC. F/u transition to oral anticoagulation, of note pt on valproic  acid which interacts with DOACs.  Rocky Slade, PharmD, BCPS Clinical Pharmacist  07/19/2024 4:33 PM   [facility] pharmacy phone numbers are listed on [email or web address]

## 2024-07-19 NOTE — Plan of Care (Signed)
  Problem: Clinical Measurements: Goal: Will remain free from infection Outcome: Progressing Goal: Cardiovascular complication will be avoided Outcome: Progressing   Problem: Safety: Goal: Ability to remain free from injury will improve Outcome: Progressing   Problem: Education: Goal: Knowledge of General Education information will improve Description: Including pain rating scale, medication(s)/side effects and non-pharmacologic comfort measures Outcome: Not Progressing   Problem: Health Behavior/Discharge Planning: Goal: Ability to manage health-related needs will improve Outcome: Not Progressing

## 2024-07-20 ENCOUNTER — Encounter: Payer: Self-pay | Admitting: Hematology and Oncology

## 2024-07-20 ENCOUNTER — Telehealth (HOSPITAL_COMMUNITY): Payer: Self-pay

## 2024-07-20 ENCOUNTER — Other Ambulatory Visit (HOSPITAL_COMMUNITY): Payer: Self-pay

## 2024-07-20 LAB — CULTURE, BLOOD (ROUTINE X 2): Culture  Setup Time: NO GROWTH

## 2024-07-20 LAB — CBC
HCT: 40.4 % (ref 36.0–46.0)
Hemoglobin: 13.2 g/dL (ref 12.0–15.0)
MCH: 29.5 pg (ref 26.0–34.0)
MCHC: 32.7 g/dL (ref 30.0–36.0)
MCV: 90.2 fL (ref 80.0–100.0)
Platelets: 181 K/uL (ref 150–400)
RBC: 4.48 MIL/uL (ref 3.87–5.11)
RDW: 15.3 % (ref 11.5–15.5)
WBC: 6.2 K/uL (ref 4.0–10.5)
nRBC: 0 % (ref 0.0–0.2)

## 2024-07-20 LAB — HEPARIN LEVEL (UNFRACTIONATED): Heparin Unfractionated: 0.64 [IU]/mL (ref 0.30–0.70)

## 2024-07-20 MED ORDER — ALBUTEROL SULFATE (2.5 MG/3ML) 0.083% IN NEBU: 2.5000 mg | INHALATION_SOLUTION | Freq: Four times a day (QID) | RESPIRATORY_TRACT | Status: AC | PRN

## 2024-07-20 MED ORDER — LEVETIRACETAM 500 MG PO TABS: 500.0000 mg | ORAL_TABLET | Freq: Two times a day (BID) | ORAL | Status: AC

## 2024-07-20 MED ORDER — APIXABAN 5 MG PO TABS: ORAL_TABLET | ORAL | Status: AC

## 2024-07-20 MED ORDER — HEPARIN SOD (PORK) LOCK FLUSH 100 UNIT/ML IV SOLN
500.0000 [IU] | INTRAVENOUS | Status: AC | PRN
  Administered 2024-07-20: 500 [IU]

## 2024-07-20 MED ORDER — CIPROFLOXACIN HCL 500 MG PO TABS: 500.0000 mg | ORAL_TABLET | Freq: Two times a day (BID) | ORAL | Status: AC

## 2024-07-20 MED ORDER — APIXABAN 5 MG PO TABS
10.0000 mg | ORAL_TABLET | Freq: Two times a day (BID) | ORAL | Status: DC
  Administered 2024-07-20: 10 mg via ORAL
  Filled 2024-07-20: qty 2

## 2024-07-20 MED ORDER — APIXABAN 5 MG PO TABS: 5.0000 mg | ORAL_TABLET | Freq: Two times a day (BID) | ORAL | Status: DC

## 2024-07-20 MED ORDER — FUROSEMIDE 20 MG PO TABS: 20.0000 mg | ORAL_TABLET | Freq: Every day | ORAL | Status: AC | PRN

## 2024-07-20 NOTE — Discharge Summary (Signed)
 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)   History of seizure   Essential hypertension   Altered mental status   Obesity, Class III, BMI 40-49.9 (morbid obesity) (HCC)   Recurrent pulmonary embolism (HCC)   MCI (mild cognitive impairment)   History of DVT (deep vein thrombosis)   Thrombocytopenia   Transaminitis   QT prolongation  Hospital Course:  58 year old female past medical history of baseline cognitive deficit, breast cancer, GERD, hypertension, hyperlipidemia, major depressive disorder, pulmonary embolism, DVT, history of breast cancer with brain metastasis, thrombocytopenia, transaminitis, chronic urinary tension, history of seizure, essential hypertension and chemotherapy induced neuropathy presented to emergency department as she has not been able to communicate for last 2 to 5 days.  At the nursing home concern for sepsis due to borderline elevated temperature.  Patient has previous hospital admission in September 2025 for acute metabolic encephalopathy in the setting of hyponatremia and infection.  At presentation to ED patient is tachycardic, tachypneic, and required nonrebreather O2 sat 100% on 15 L. Lab, normal lactic acid.  UA unremarkable.  Blood culture are in process.  CMP elevated creatinine 1.25, transaminitis and hyperbilirubinemia.  CBC showing leukocytosis 12.3, stable H&H and low platelet 117.  Chest x-ray no active disease process. CT head no acute intercranial abnormality.  Carcinomatosis  with encephalomalacia. CT chest abdomen and pelvis concern for left-sided pyelonephritis. CTA chest bilateral PE with associated right heart strain.  In the ED patient received vancomycin  and cefepime , 500 mL of LR bolus. Questionable noncompliance with Eliquis .  In the ED patient being started on heparin  drip.  Hospitalist consulted for further evaluation management of sepsis in the setting of pyelonephritis, altered mental status and persistent pulmonary embolism.  Patient remained on room air, echocardiogram also shows moderately reduced RV EF, normal PA pressures.  There was pharmacy concern of using Eliquis  with valproic  acid as it can decrease the efficacy of Eliquis  on some studies but those were inconclusive.  As patient is high risk for thrombosis based on underlying active and advanced malignancy, we decided to switch her to Keppra  and valproic  acid was discontinued.  Patient should continue on Eliquis .  Had a lengthy discussion with sisters as patient will be high risk for any abnormal bleeding specially in brain with her history of recent brain mets and surgery.  We also discussed about vascular involvement for mechanical thrombectomy and thrombolysis but risk were way higher than the benefit.  Family agrees to stay conservative with anticoagulation as there was no vital instability or hypoxia.  Her vitals remained stable, mentation at baseline.  She received heparin  infusion for 3 days and is being switched to Eliquis  on discharge.  Patient should be followed up by palliative care at facility.  She will continue on current medications and need to have a close follow-up with her providers for further assistance.  Assessment and Plan: Sepsis Acute pyelonephritis Altered mental status in the setting of sepsis and AKI - UA not impressive, CT with left pyelonephritis. -Will continue cefepime , 1/4 blood cultures with Staphylococcus species likely a  contaminant. -CT chest with right  lower lobe opacity likely infarction from PE. - Patient received cefepime  while in the hospital and being transition to Cipro  for few more doses to complete the course on discharge.   Bilateral PE History of DVT PE - Diagnosed with DVT PE in September 2025, apparently patient has not been taking Eliquis  for about a month for unclear reasons. -CT angiogram shows bilateral PE with right heart strain. -Echocardiogram with moderately depressed RVF - Patient initially received heparin  infusion and now being switched to Eliquis  which she should continue at her facility.  Patient will remain high risk for thrombosis due to underlying active malignancy.  She will be high risk for any bleeding especially in the brain due to mets to brain. - Vital signs are stable. - Patient currently satting well on room air  Palliative care to follow-up at facility, patient is appropriate for transition to hospice.   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  Per chart review from recent oncology visit 10/15 lack of evidence of metastasis.  Oncology recommending reinitiating anti-HER2 therapy and will evaluate CT CAP in 1 year and parotic MRI of the brain. -CT head today showed Left frontal and frontotemporal craniotomies with subjacent encephalomalacia.    Essential hypertension Blood pressure within goal Holding amlodipine  in the setting of sepsis    Acute kidney injury - Resolved with IV fluid. - Monitor renal function   Thrombocytopenia - Small improvement with platelets at 136.  Currently on heparin  drip.  Continue to monitor   Transaminitis Hepatic steatosis Cholelithiasis -Improving transaminitis and T. bili. -  CT abdomen pelvis showing fatty liver, cholelithiasis and no evidence of biliary ductal dilation. -Need to follow-up with hepatic function panel in 4 to 6 weeks in outpatient setting.   Prolonged QTc   prolongation -EKG showing sinus tachycardia heart rate 102 and prolonged QTc 635 ms. -Avoid QT prolonging medications.   History of seizure. Undetectable valproic  acid - Switching valproic  acid with Keppra  - Continue to monitor   Generalized anxiety disorder and depression - Continue Zoloft  25 mg daily  Consultants: None Procedures performed: None Disposition: Skilled nursing facility Diet recommendation:  Discharge Diet Orders (From admission, onward)     Start     Ordered   07/20/24 0000  Diet - low sodium heart healthy        07/20/24 1157           Regular diet DISCHARGE MEDICATION: Allergies as of 07/20/2024       Reactions   Dilaudid [hydromorphone Hcl] Other (See Comments)   Confusion & hallucinations   Morphine And Codeine Other (See Comments)   Headaches        Medication List     STOP taking these medications    divalproex  250 MG DR tablet Commonly known as: DEPAKOTE    potassium chloride  10 MEQ tablet Commonly known as: KLOR-CON  M   sodium chloride  0.9 % infusion       TAKE these medications    albuterol  (2.5 MG/3ML) 0.083% nebulizer solution Commonly known as: PROVENTIL  Take 3 mLs (2.5 mg total) by nebulization every 6 (six) hours as needed for wheezing or shortness of breath.   amLODipine  5 MG tablet Commonly known as: NORVASC  Take 1 tablet (5 mg total) by mouth daily.   apixaban  5 MG Tabs tablet Commonly known as: ELIQUIS  Take 2 tablets (10 mg total) by mouth 2 (two)  times daily for 3 days, THEN 1 tablet (5 mg total) 2 (two) times daily. Start taking on: July 20, 2024   ciprofloxacin  500 MG tablet Commonly known as: Cipro  Take 1 tablet (500 mg total) by mouth 2 (two) times daily for 3 doses.   furosemide  20 MG tablet Commonly known as: LASIX  Take 1 tablet (20 mg total) by mouth daily as needed for fluid or edema. What changed:  when to take this reasons to take this   levETIRAcetam  500 MG tablet Commonly known as:  KEPPRA  Take 1 tablet (500 mg total) by mouth 2 (two) times daily.   OXYGEN Inhale 2 L into the lungs in the morning and at bedtime.   pantoprazole  40 MG tablet Commonly known as: PROTONIX  Take 1 tablet (40 mg total) by mouth daily.   sertraline  25 MG tablet Commonly known as: ZOLOFT  Take 25 mg by mouth in the morning.   thiamine  100 MG tablet Commonly known as: VITAMIN B1 Take 1 tablet (100 mg total) by mouth daily.   Tylenol  8 Hour Arthritis Pain 650 MG CR tablet Generic drug: acetaminophen  Take 1 tablet (650 mg total) by mouth every 8 (eight) hours as needed for pain.        Contact information for after-discharge care     Destination     [facility] .   Service: Skilled Nursing Contact information: [address] [place]  [address] [phone]                    Discharge Exam: Fredricka Weights   07/17/24 0000  Weight: (!) 142.9 kg   General.  Morbidly obese lady, in no acute distress. Pulmonary.  Lungs clear bilaterally, normal respiratory effort. CV.  Regular rate and rhythm, no JVD, rub or murmur. Abdomen.  Soft, nontender, nondistended, BS positive. CNS.  Alert and oriented x 2.  No focal neurologic deficit. Extremities.  No edema,  pulses intact and symmetrical.  Condition at discharge: stable  The results of significant diagnostics from this hospitalization (including imaging, microbiology, ancillary and laboratory) are listed below for reference.   Imaging Studies: ECHOCARDIOGRAM COMPLETE Result Date: 07/17/2024    ECHOCARDIOGRAM REPORT   Patient Name:   SHYLIN KEIZER Date of Exam: 07/17/2024 Medical Rec #:  [id number]      Height:       66.0 in Accession #:    [id number]     Weight:       315.0 lb Date of Birth:  Jan 25, 1966       BSA:          2.426 m Patient Age:    58 years       BP:           143/95 mmHg Patient Gender: F              HR:           100 bpm. Exam Location:  Inpatient Procedure: 2D Echo and Intracardiac  Opacification Agent (Both Spectral and Color            Flow Doppler were utilized during procedure). Indications:    Pulmonary Embolism  History:        Patient has prior history of Echocardiogram examinations.                 Signs/Symptoms:Pulmonary Embolism.  Sonographer:    Norleen Amour Referring Phys: [id number] SUBRINA SUNDIL IMPRESSIONS  1. Left ventricular ejection fraction, by estimation, is 55%. The  left ventricle has normal function. The left ventricle has no regional wall motion abnormalities. Left ventricular diastolic parameters are consistent with Grade I diastolic dysfunction (impaired relaxation).  2. Right ventricular systolic function is moderately reduced. The right ventricular size is moderately enlarged. There is normal pulmonary artery systolic pressure.  3. The mitral valve is normal in structure. No evidence of mitral valve regurgitation. No evidence of mitral stenosis.  4. The aortic valve is tricuspid. Aortic valve regurgitation is not visualized. No aortic stenosis is present.  5. Aortic dilatation noted. There is mild dilatation of the ascending aorta, measuring 39 mm.  6. The inferior vena cava is normal in size with greater than 50% respiratory variability, suggesting right atrial pressure of 3 mmHg. FINDINGS  Left Ventricle: Left ventricular ejection fraction, by estimation, is 55%. The left ventricle has normal function. The left ventricle has no regional wall motion abnormalities. Strain was performed and the global longitudinal strain is indeterminate. The left ventricular internal cavity size was normal in size. There is no left ventricular hypertrophy. Left ventricular diastolic parameters are consistent with Grade I diastolic dysfunction (impaired relaxation). Right Ventricle: The right ventricular size is moderately enlarged. No increase in right ventricular wall thickness. Right ventricular systolic function is moderately reduced. There is normal pulmonary artery systolic  pressure. The tricuspid regurgitant velocity is 2.48 m/s, and with an assumed right atrial pressure of 8 mmHg, the estimated right ventricular systolic pressure is 32.6 mmHg. Left Atrium: Left atrial size was normal in size. Right Atrium: Right atrial size was normal in size. Pericardium: There is no evidence of pericardial effusion. Mitral Valve: The mitral valve is normal in structure. No evidence of mitral valve regurgitation. No evidence of mitral valve stenosis. MV peak gradient, 2.6 mmHg. The mean mitral valve gradient is 1.0 mmHg. Tricuspid Valve: The tricuspid valve is normal in structure. Tricuspid valve regurgitation is mild . No evidence of tricuspid stenosis. Aortic Valve: The aortic valve is tricuspid. Aortic valve regurgitation is not visualized. No aortic stenosis is present. Aortic valve mean gradient measures 3.0 mmHg. Aortic valve peak gradient measures 5.5 mmHg. Aortic valve area, by VTI measures 1.79 cm. Pulmonic Valve: The pulmonic valve was normal in structure. Pulmonic valve regurgitation is not visualized. No evidence of pulmonic stenosis. Aorta: Aortic dilatation noted. There is mild dilatation of the ascending aorta, measuring 39 mm. Venous: The inferior vena cava is normal in size with greater than 50% respiratory variability, suggesting right atrial pressure of 3 mmHg. IAS/Shunts: The interatrial septum was not well visualized. Additional Comments: 3D was performed not requiring image post processing on an independent workstation and was indeterminate.  LEFT VENTRICLE PLAX 2D LVIDd:         3.90 cm   Diastology LVIDs:         2.80 cm   LV e' medial:    8.39 cm/s LV PW:         1.20 cm   LV E/e' medial:  6.2 LV IVS:        1.00 cm   LV e' lateral:   11.70 cm/s LVOT diam:     2.00 cm   LV E/e' lateral: 4.4 LV SV:         38 LV SV Index:   16 LVOT Area:     3.14 cm  RIGHT VENTRICLE RV Basal diam:  4.10 cm     PULMONARY VEINS RV Mid diam:    3.90 cm     Diastolic Velocity: 36.60  cm/s RV  FAC:         30.8 %      S/D Velocity:       1.20 RV S prime:     13.10 cm/s  Systolic Velocity:  44.00 cm/s TAPSE (M-mode): 1.7 cm LEFT ATRIUM             Index        RIGHT ATRIUM           Index LA diam:        3.30 cm 1.36 cm/m   RA Area:     16.80 cm LA Vol (A2C):   37.6 ml 15.50 ml/m  RA Volume:   46.60 ml  19.21 ml/m LA Vol (A4C):   29.2 ml 12.04 ml/m LA Biplane Vol: 34.6 ml 14.26 ml/m  AORTIC VALVE                    PULMONIC VALVE AV Area (Vmax):    2.01 cm     PV Vmax:       1.05 m/s AV Area (Vmean):   1.74 cm     PV Peak grad:  4.4 mmHg AV Area (VTI):     1.79 cm AV Vmax:           117.50 cm/s AV Vmean:          82.550 cm/s AV VTI:            0.212 m AV Peak Grad:      5.5 mmHg AV Mean Grad:      3.0 mmHg LVOT Vmax:         75.00 cm/s LVOT Vmean:        45.600 cm/s LVOT VTI:          0.121 m LVOT/AV VTI ratio: 0.57  AORTA Ao Root diam: 3.30 cm Ao Asc diam:  3.90 cm MITRAL VALVE               TRICUSPID VALVE MV Area (PHT): 5.75 cm    TR Peak grad:   24.6 mmHg MV Area VTI:   3.58 cm    TR Vmax:        248.00 cm/s MV Peak grad:  2.6 mmHg MV Mean grad:  1.0 mmHg    SHUNTS MV Vmax:       0.80 m/s    Systemic VTI:  0.12 m MV Vmean:      42.4 cm/s   Systemic Diam: 2.00 cm MV Decel Time: 132 msec MV E velocity: 52.00 cm/s MV A velocity: 73.80 cm/s MV E/A ratio:  0.70 Maude Emmer MD Electronically signed by Maude Emmer MD Signature Date/Time: 07/17/2024/11:36:31 AM    Final    VAS US  LOWER EXTREMITY VENOUS (DVT) Result Date: 07/17/2024  Lower Venous DVT Study Patient Name:  TANNAH DREYFUSS  Date of Exam:   07/17/2024 Medical Rec #: [id number]       Accession #:    [id number] Date of Birth: [birth year]        Patient Gender: F Patient Age:   57 years Exam Location:  [facility] Procedure:      VAS US  LOWER EXTREMITY VENOUS (DVT) Referring Phys: MICAELA SUNDIL --------------------------------------------------------------------------------  Indications: Pain, and Swelling.  Risk Factors:  Confirmed PE obesity and breast cancer. Limitations: Body habitus and poor ultrasound/tissue interface. Comparison Study: No priors. Performing Technologist: Ricka Sturdivant-Jones RDMS, RVT  Examination Guidelines: A complete evaluation includes B-mode imaging, spectral Doppler, color Doppler,  and power Doppler as needed of all accessible portions of each vessel. Bilateral testing is considered an integral part of a complete examination. Limited examinations for reoccurring indications may be performed as noted. The reflux portion of the exam is performed with the patient in reverse Trendelenburg.  +---------+---------------+---------+-----------+----------+------------------+ RIGHT    CompressibilityPhasicitySpontaneityPropertiesThrombus Aging     +---------+---------------+---------+-----------+----------+------------------+ CFV      Full           Yes      Yes                                     +---------+---------------+---------+-----------+----------+------------------+ SFJ      Full                                                            +---------+---------------+---------+-----------+----------+------------------+ FV Prox  Full                                                            +---------+---------------+---------+-----------+----------+------------------+ FV Mid   Full           Yes      Yes                                     +---------+---------------+---------+-----------+----------+------------------+ FV Distal               Yes      Yes                                     +---------+---------------+---------+-----------+----------+------------------+ POP      Full           Yes      Yes                                     +---------+---------------+---------+-----------+----------+------------------+ PTV                                                   poortly visualized  +---------+---------------+---------+-----------+----------+------------------+ PERO     Full                                                            +---------+---------------+---------+-----------+----------+------------------+   +---------+---------------+---------+-----------+----------+--------------+ LEFT     CompressibilityPhasicitySpontaneityPropertiesThrombus Aging +---------+---------------+---------+-----------+----------+--------------+ CFV      Full           Yes      Yes                                 +---------+---------------+---------+-----------+----------+--------------+  SFJ      Full                                                        +---------+---------------+---------+-----------+----------+--------------+ FV Prox  Full                                                        +---------+---------------+---------+-----------+----------+--------------+ FV Mid   Full           Yes      Yes                                 +---------+---------------+---------+-----------+----------+--------------+ FV Distal               Yes      Yes                                 +---------+---------------+---------+-----------+----------+--------------+ PFV      Full                                                        +---------+---------------+---------+-----------+----------+--------------+ POP      Full           Yes      Yes                                 +---------+---------------+---------+-----------+----------+--------------+ PTV      Full                                                        +---------+---------------+---------+-----------+----------+--------------+ PERO     Full                                                        +---------+---------------+---------+-----------+----------+--------------+     Summary: RIGHT: - There is no evidence of deep vein thrombosis in the lower extremity. However, portions of  this examination were limited- see technologist comments above.  - No cystic structure found in the popliteal fossa.  LEFT: - There is no evidence of deep vein thrombosis in the lower extremity.  - No cystic structure found in the popliteal fossa.  *See table(s) above for measurements and observations. Electronically signed by Lonni Gaskins MD on 07/17/2024 at 10:36:12 AM.    Final    CT Angio Chest PE W and/or Wo Contrast Addendum Date: 07/17/2024  ADDENDUM #1 * ADDENDUM: The upper abdomen should mention cholelithiasis. Discussed with Dr. Midge by Dr. Margarite  on the phone on 07/17/24 at 12:09pm. ---------------------------------------------------- Electronically signed by: Kate Plummer MD 07/17/2024 12:11 AM EST RP Workstation: [id number]   Result Date: 07/17/2024  ORIGINAL REPORT  EXAM: CTA CHEST AORTA 07/16/2024 11:50:39 PM TECHNIQUE: CTA of the chest was performed after the administration of 75 mL of intravenous iohexol  (OMNIPAQUE ) 350 MG/ML injection. Multiplanar reformatted images are provided for review. MIP images are provided for review. Automated exposure control, iterative reconstruction, and/or weight based adjustment of the mA/kV was utilized to reduce the radiation dose to as low as reasonably achievable. COMPARISON: None available. CLINICAL HISTORY: Recently diagnosed with saddle PE. Eval for worsening PE burden. FINDINGS: AORTA: No thoracic aortic dissection. No aneurysm. MEDIASTINUM: Bilateral distal central left and right pulmonary emboli extending to the segmental and subsegmental levels of all 5 lobes. The main pulmonary artery is normal in caliber. Enlarged RV to LV ratio of 1.1. Associated straightening of the interventricular septum. The pericardium demonstrates no acute abnormality. No mediastinal lymphadenopathy. LYMPH NODES: No mediastinal, hilar or axillary lymphadenopathy. LUNGS AND PLEURA: Right lower lobe base airspace opacity. Elevated right hemidiaphragm. No definite  pleural effusion or pneumothorax. UPPER ABDOMEN: Nonspecific left perinephric fat stranding. SOFT TISSUES AND BONES: No acute bone or soft tissue abnormality. IMPRESSION: 1. Bilateral distal central left and right pulmonary emboli extending to the segmental and subsegmental levels of all 5 lobes with associated right heart strain. 2. Right lower lobe airspace opacity, which may represent pulmonary infarction versus atelectasis in the setting of an elevated diaphragm. . Electronically signed by: Morgane Naveau MD 07/17/2024 12:08 AM EST RP Workstation: [id number]   CT ABDOMEN PELVIS W CONTRAST Result Date: 07/16/2024 CLINICAL DATA:  Elevated LFT. EXAM: CT ABDOMEN AND PELVIS WITH CONTRAST TECHNIQUE: Multidetector CT imaging of the abdomen and pelvis was performed using the standard protocol following bolus administration of intravenous contrast. RADIATION DOSE REDUCTION: This exam was performed according to the departmental dose-optimization program which includes automated exposure control, adjustment of the mA and/or kV according to patient size and/or use of iterative reconstruction technique. CONTRAST:  75mL OMNIPAQUE  IOHEXOL  350 MG/ML SOLN COMPARISON:  CT abdomen and pelvis 06/08/2022 FINDINGS: Lower chest: There is atelectasis in the right lung base. Hepatobiliary: There is diffuse fatty infiltration of the liver. Gallstones are present. There is no biliary ductal dilatation. Pancreas: Unremarkable. No pancreatic ductal dilatation or surrounding inflammatory changes. Spleen: Normal in size without focal abnormality. Adrenals/Urinary Tract: There is limited evaluation of the kidneys secondary to patient's arm positioning. There are likely patchy areas of hypoattenuation throughout the left kidney which are ill-defined. There is mild left perinephric fat stranding. There is no hydronephrosis in either kidney. The right kidney and adrenal glands are within normal limits. The bladder is decompressed.  Stomach/Bowel: Stomach is within normal limits. Appendix appears normal. No evidence of bowel wall thickening, distention, or inflammatory changes.Sigmoid colon anastomosis is present. Vascular/Lymphatic: No significant vascular findings are present. No enlarged abdominal or pelvic lymph nodes. Reproductive: There is lobulated uterine. There are multiple calcifications likely related to fibroids measuring up to 3.8 cm. Adnexa are within normal limits. Other: No abdominal wall hernia or abnormality. No abdominopelvic ascites. Musculoskeletal: No fracture is seen. IMPRESSION: 1. Findings suspicious for left-sided pyelonephritis. 2. Cholelithiasis. 3. Fatty infiltration of the liver. 4. Fibroid uterus. Electronically Signed   By: Greig Pique M.D.   On: 07/16/2024 23:58   CT Head Wo Contrast Result Date: 07/16/2024 EXAM: CT HEAD WITHOUT CONTRAST 07/16/2024 08:46:38 PM TECHNIQUE: CT of the head was performed without  the administration of intravenous contrast. Automated exposure control, iterative reconstruction, and/or weight based adjustment of the mA/kV was utilized to reduce the radiation dose to as low as reasonably achievable. COMPARISON: Prior MRI examination of 06/12/2022. CLINICAL HISTORY: increasing AMS. last outside MRI showed parietal lobe lesion increasing AMS. last outside MRI showed parietal lobe lesion FINDINGS: BRAIN AND VENTRICLES: Encephalomalacia is present subjacent to the craniotomy flaps. Parenchymal volume loss is commensurate with the patient's age. Periventricular white matter changes are present likely reflecting the sequela of small vessel ischemia. No acute hemorrhage. No evidence of acute infarct. No hydrocephalus. No extra-axial collection. No mass effect or midline shift. ORBITS: No acute abnormality. SINUSES: Fluid opacification of several inferior bilateral mastoid air cells is again identified, unchanged from prior MRI examination of 06/12/2022 without superimposed osseous erosion.  SOFT TISSUES AND SKULL: Left frontal and frontotemporal craniotomies are again identified. No acute soft tissue abnormality. No acute skull fracture. IMPRESSION: 1. No acute intracranial abnormality. 2. Left frontal and frontotemporal craniotomies with subjacent encephalomalacia. 3. Fluid opacification of several inferior bilateral mastoid air cells, unchanged from prior MRI, without superimposed osseous erosion. Electronically signed by: Dorethia Molt MD 07/16/2024 08:56 PM EST RP Workstation: [id number]   DG Chest Port 1 View Result Date: 07/16/2024 CLINICAL DATA:  Questionable sepsis. EXAM: PORTABLE CHEST 1 VIEW COMPARISON:  Chest radiograph dated 04/03/2024. FINDINGS: Stable positioning of the left Port-A-Cath. Shallow inspiration. No focal consolidation, pleural effusion, pneumothorax. Stable cardiac silhouette. No acute osseous pathology. IMPRESSION: No active disease. Electronically Signed   By: Vanetta Chou M.D.   On: 07/16/2024 19:40    Microbiology: Results for orders placed or performed during the hospital encounter of 07/16/24  Blood Culture (routine x 2)     Status: Abnormal   Collection Time: 07/16/24  8:10 PM   Specimen: BLOOD  Result Value Ref Range Status   Specimen Description BLOOD LEFT ANTECUBITAL  Final   Special Requests   Final    BOTTLES DRAWN AEROBIC AND ANAEROBIC Blood Culture results may not be optimal due to an inadequate volume of blood received in culture bottles   Culture  Setup Time   Final    GRAM POSITIVE COCCI ANAEROBIC BOTTLE ONLY [facility] 07/18/2024 @ 0317 BY DD    Culture (A)  Final    STAPHYLOCOCCUS CAPITIS THE SIGNIFICANCE OF ISOLATING THIS ORGANISM FROM A SINGLE SET OF BLOOD CULTURES WHEN MULTIPLE SETS ARE DRAWN IS UNCERTAIN. PLEASE NOTIFY THE MICROBIOLOGY DEPARTMENT WITHIN ONE WEEK IF SPECIATION AND SENSITIVITIES ARE REQUIRED. Performed at [facility], [address] [place], [place] [address]    Report Status 07/20/2024 FINAL   Final  Blood Culture ID Panel (Reflexed)     Status: Abnormal   Collection Time: 07/16/24  8:10 PM  Result Value Ref Range Status   Enterococcus faecalis NOT DETECTED NOT DETECTED Final   Enterococcus Faecium NOT DETECTED NOT DETECTED Final   Listeria monocytogenes NOT DETECTED NOT DETECTED Final   Staphylococcus species DETECTED (A) NOT DETECTED Final    Comment: CRITICAL RESULT CALLED TO, READ BACK BY AND VERIFIED WITH: [facility] 07/18/2024 @ 0317 BY DD    Staphylococcus aureus (BCID) NOT DETECTED NOT DETECTED Final   Staphylococcus epidermidis NOT DETECTED NOT DETECTED Final   Staphylococcus lugdunensis NOT DETECTED NOT DETECTED Final   Streptococcus species NOT DETECTED NOT DETECTED Final   Streptococcus agalactiae NOT DETECTED NOT DETECTED Final   Streptococcus pneumoniae NOT DETECTED NOT DETECTED Final  Streptococcus pyogenes NOT DETECTED NOT DETECTED Final   A.calcoaceticus-baumannii NOT DETECTED NOT DETECTED Final   Bacteroides fragilis NOT DETECTED NOT DETECTED Final   Enterobacterales NOT DETECTED NOT DETECTED Final   Enterobacter cloacae complex NOT DETECTED NOT DETECTED Final   Escherichia coli NOT DETECTED NOT DETECTED Final   Klebsiella aerogenes NOT DETECTED NOT DETECTED Final   Klebsiella oxytoca NOT DETECTED NOT DETECTED Final   Klebsiella pneumoniae NOT DETECTED NOT DETECTED Final   Proteus species NOT DETECTED NOT DETECTED Final   Salmonella species NOT DETECTED NOT DETECTED Final   Serratia marcescens NOT DETECTED NOT DETECTED Final   Haemophilus influenzae NOT DETECTED NOT DETECTED Final   Neisseria meningitidis NOT DETECTED NOT DETECTED Final   Pseudomonas aeruginosa NOT DETECTED NOT DETECTED Final   Stenotrophomonas maltophilia NOT DETECTED NOT DETECTED Final   Candida albicans NOT DETECTED NOT DETECTED Final   Candida auris NOT DETECTED NOT DETECTED Final   Candida glabrata NOT DETECTED NOT DETECTED Final   Candida krusei NOT DETECTED NOT  DETECTED Final   Candida parapsilosis NOT DETECTED NOT DETECTED Final   Candida tropicalis NOT DETECTED NOT DETECTED Final   Cryptococcus neoformans/gattii NOT DETECTED NOT DETECTED Final    Comment: Performed at [facility], [address], [place], [place] [address]  Blood Culture (routine x 2)     Status: None (Preliminary result)   Collection Time: 07/16/24  8:25 PM   Specimen: BLOOD  Result Value Ref Range Status   Specimen Description BLOOD RIGHT ANTECUBITAL  Final   Special Requests   Final    BOTTLES DRAWN AEROBIC AND ANAEROBIC Blood Culture adequate volume   Culture   Final    NO GROWTH 4 DAYS Performed at [facility], [address], [place], [place] [address]    Report Status PENDING  Incomplete  MRSA Next Gen by PCR, Nasal     Status: None   Collection Time: 07/17/24 12:05 AM   Specimen: Nasal Mucosa; Nasal Swab  Result Value Ref Range Status   MRSA by PCR Next Gen NOT DETECTED NOT DETECTED Final    Comment: (NOTE) The GeneXpert MRSA Assay (FDA approved for NASAL specimens only), is one component of a comprehensive MRSA colonization surveillance program. It is not intended to diagnose MRSA infection nor to guide or monitor treatment for MRSA infections. Test performance is not FDA approved in patients less than 51 years old. Performed at [facility], [address], [place], [place] [address]     Labs: CBC: Recent Labs  Lab 07/16/24 2017 07/16/24 2023 07/16/24 2024 07/18/24 0552 07/19/24 1558 07/20/24 0130  WBC 12.3*  --   --  6.8 6.9 6.2  NEUTROABS 8.5*  --   --   --   --   --   HGB 14.6 15.0 15.6* 12.2 12.3 13.2  HCT 44.2 44.0 46.0 37.5 36.8 40.4  MCV 91.1  --   --  91.2 89.5 90.2  PLT 117*  --   --  136* 180 181   Basic Metabolic Panel: Recent Labs  Lab 07/16/24 2017 07/16/24 2023 07/16/24 2024 07/18/24 0552  NA 140 140 141 139  K 3.6 3.5 3.5 3.2*  CL 101  --  104 104  CO2 24  --   --  23  GLUCOSE 129*  --  127* 106*   BUN 14  --  17 11  CREATININE 1.25*  --  1.20* 0.88  CALCIUM  8.8*  --   --  8.5*  MG 1.7  --   --   --    Liver Function Tests: Recent Labs  Lab 07/16/24 2017 07/18/24 0552  AST 113* 57*  ALT 188* 103*  ALKPHOS 66 58  BILITOT 1.8* 1.3*  PROT 6.8 6.2*  ALBUMIN 2.6* 2.3*   CBG: No results for input(s): GLUCAP in the last 168 hours.  Discharge time spent: greater than 30 minutes.  This record has been created using Conservation officer, historic buildings. Errors have been sought and corrected,but may not always be located. Such creation errors do not reflect on the standard of care.   Signed: Amaryllis Dare, MD [facility] 07/20/2024

## 2024-07-20 NOTE — TOC Transition Note (Signed)
 Transition of Care [facility]) - Discharge Note   Patient Details  Name: Monica Mccoy MRN: [id number] Date of Birth: Sep 07, 1965  Transition of Care [facility]) CM/SW Contact:  Almarie CHRISTELLA Goodie, LCSW Phone Number: 07/20/2024, 1:01 PM   Clinical Narrative:   CSW spoke with patient's sisters, Varnetta and [name]. Patient is from [facility], plan is to return. CSW confirmed with [facility] they can accept back, patient would need PT to see for continuation of SNF. CSW updated MD, order placed, and discussed with PT about return. Discharge information sent to [facility], confirmed receipt. Transport arranged with [facility] for next available.  Nurse to call report to [phone], room 203B.    Final next level of care: Skilled Nursing Facility Barriers to Discharge: Barriers Resolved   Patient Goals and CMS Choice            Discharge Placement              Patient chooses bed at: [facility] Patient to be transferred to facility by: [facility] Name of family member notified: Patrecia President Patient and family notified of of transfer: 07/20/24  Discharge Plan and Services Additional resources added to the After Visit Summary for                                       Social Drivers of Health (SDOH) Interventions SDOH Screenings   Food Insecurity: Patient Unable To Answer (07/18/2024)  Housing: Patient Unable To Answer (07/18/2024)  Transportation Needs: No Transportation Needs (05/06/2024)   Received from [facility]  Recent Concern: Transportation Needs - Unmet Transportation Needs (04/03/2024)  Utilities: Not At Risk (05/06/2024)   Received from [facility]  Recent Concern: Utilities - At Risk (04/03/2024)  Alcohol Screen: Low Risk  (03/27/2022)  Depression (PHQ2-9): High Risk (05/21/2022)  Financial Resource Strain: Low Risk  (03/27/2022)  Physical Activity: Insufficiently Active (03/27/2022)  Social Connections: Feeling Socially Integrated  (01/11/2023)   Received from [facility]  Stress: Patient Unable To Answer (05/06/2024)   Received from [facility]  Tobacco Use: Low Risk  (07/17/2024)     Readmission Risk Interventions    08/19/2023   11:05 AM 06/11/2022   11:48 AM 06/10/2022   10:14 AM  Readmission Risk Prevention Plan  Post Dischage Appt Complete    Medication Screening Complete    Transportation Screening Complete Complete Complete  PCP or Specialist Appt within 5-7 Days  Complete Complete  Home Care Screening  Complete Complete  Medication Review (RN CM)  Complete Complete

## 2024-07-20 NOTE — Evaluation (Signed)
 Physical Therapy Evaluation Patient Details Name: Monica Mccoy MRN: [id number] DOB: September 19, 1965 Today's Date: 07/20/2024  History of Present Illness  58 y.o. female presents to [facility] hospital on 07/16/2024 with impaired communication and foul smelling urine. Pt admitted for UTI and sepsis. PMH includes cognitive deficit, breast CA, GERD, HTN, HLD, MDD, PE, DVT, transaminitis, seizure.  Clinical Impression  Pt presents to PT with deficits in functional mobility, strength, power, endurance, communication. Pt is unable to report recent baseline due to deficits in communication however notes from August of 2025 indicate the pt was ambulatory with a RW prior to that admission. Pt was receiving PT services prior to this admission based on reports from LCSW. Pt currently requires significant physical assistance for all functional mobility tasks and demonstrates significant delays in command following. Pt remains at a high risk for falls and is dependent on caregiver support for all functional mobility at this time. Patient will benefit from continued inpatient follow up therapy, <3 hours/day.      If plan is discharge home, recommend the following: Two people to help with walking and/or transfers;Two people to help with bathing/dressing/bathroom;Assistance with feeding;Assistance with cooking/housework;Direct supervision/assist for medications management;Direct supervision/assist for financial management;Assist for transportation;Help with stairs or ramp for entrance;Supervision due to cognitive status   Can travel by private vehicle   No    Equipment Recommendations Hospital bed;Hoyer lift;Other (comment) (power wheelchair)  Recommendations for Other Services       Functional Status Assessment Patient has had a recent decline in their functional status and demonstrates the ability to make significant improvements in function in a reasonable and predictable amount of time.     Precautions /  Restrictions Precautions Precautions: Fall Recall of Precautions/Restrictions: Impaired Restrictions Weight Bearing Restrictions Per Provider Order: No      Mobility  Bed Mobility Overal bed mobility: Needs Assistance Bed Mobility: Supine to Sit, Sit to Supine     Supine to sit: Max assist, HOB elevated Sit to supine: Total assist        Transfers Overall transfer level:  (pt declines attempts, successful standing with +1 assist seems unlikely at this time based on deficits in strength and mobility)                      Ambulation/Gait                  Stairs            Wheelchair Mobility     Tilt Bed    Modified Rankin (Stroke Patients Only)       Balance Overall balance assessment: Needs assistance Sitting-balance support: Single extremity supported, Feet supported Sitting balance-Leahy Scale: Poor Sitting balance - Comments: min-modA for static sitting                                     Pertinent Vitals/Pain Pain Assessment Pain Assessment: PAINAD Breathing: occasional labored breathing, short period of hyperventilation Negative Vocalization: none Facial Expression: smiling or inexpressive Body Language: relaxed Consolability: no need to console PAINAD Score: 1    Home Living Family/patient expects to be discharged to:: Skilled nursing facility                   Additional Comments: pt is unable to provide a history due to impaired cognition and communication. Notes indicate the pt arrived from [facility]  Prior Function Prior Level of Function : Needs assist             Mobility Comments: during evaluation pt responds with yes when asked if she was working on standing with rehab recently. Pt is otherwise unable to provide accurate history regarding mobility. Notes in August of 2025 indicate the pt was walking with a RW prior to that admission       Extremity/Trunk Assessment    Upper Extremity Assessment Upper Extremity Assessment: Generalized weakness;Difficult to assess due to impaired cognition (raises both extremities against gravity, grossly 3+/5)    Lower Extremity Assessment Lower Extremity Assessment: Generalized weakness;Difficult to assess due to impaired cognition (raises both extremities against gravity, grossly 3+/5)    Cervical / Trunk Assessment Cervical / Trunk Assessment: Other exceptions Cervical / Trunk Exceptions: body habitus  Communication   Communication Communication: Impaired Factors Affecting Communication: Difficulty expressing self;Reduced clarity of speech    Cognition Arousal: Alert Behavior During Therapy: Flat affect   PT - Cognitive impairments: History of cognitive impairments, Difficult to assess Difficult to assess due to: Impaired communication                     PT - Cognition Comments: very slowed response time, limited verbal responses Following commands: Impaired Following commands impaired: Follows one step commands with increased time, Follows one step commands inconsistently     Cueing Cueing Techniques: Verbal cues, Tactile cues, Visual cues     General Comments General comments (skin integrity, edema, etc.): pt on RA, HR stable. SpO2 90-91%    Exercises     Assessment/Plan    PT Assessment Patient needs continued PT services  PT Problem List Decreased strength;Decreased activity tolerance;Decreased balance;Decreased mobility;Decreased knowledge of use of DME;Decreased knowledge of precautions;Decreased safety awareness       PT Treatment Interventions Gait training;DME instruction;Functional mobility training;Therapeutic activities;Therapeutic exercise;Balance training;Neuromuscular re-education;Cognitive remediation;Patient/family education;Wheelchair mobility training    PT Goals (Current goals can be found in the Care Plan section)  Acute Rehab PT Goals Patient Stated Goal: PT goal to  improve bed mobility ability, reduce caregiver burden PT Goal Formulation: Patient unable to participate in goal setting Time For Goal Achievement: 08/03/24 Potential to Achieve Goals: Poor    Frequency Min 1X/week     Co-evaluation               AM-PAC PT 6 Clicks Mobility  Outcome Measure Help needed turning from your back to your side while in a flat bed without using bedrails?: A Lot Help needed moving from lying on your back to sitting on the side of a flat bed without using bedrails?: A Lot Help needed moving to and from a bed to a chair (including a wheelchair)?: Total Help needed standing up from a chair using your arms (e.g., wheelchair or bedside chair)?: Total Help needed to walk in hospital room?: Total Help needed climbing 3-5 steps with a railing? : Total 6 Click Score: 8    End of Session   Activity Tolerance: Patient limited by fatigue Patient left: in bed;with call bell/phone within reach;with bed alarm set Nurse Communication: Mobility status;Need for lift equipment PT Visit Diagnosis: Other abnormalities of gait and mobility (R26.89);Muscle weakness (generalized) (M62.81)    Time: 8974-8944 PT Time Calculation (min) (ACUTE ONLY): 30 min   Charges:   PT Evaluation $PT Eval Low Complexity: 1 Low   PT General Charges $$ ACUTE PT VISIT: 1 Visit  Bernardino JINNY Ruth, PT, DPT Acute Rehabilitation Office [phone]   Bernardino JINNY Ruth 07/20/2024, 11:12 AM

## 2024-07-20 NOTE — Progress Notes (Signed)
 She is discharged and transported by [facility]. IV cannula removed.  She was clean and dry before transport.

## 2024-07-20 NOTE — Telephone Encounter (Signed)
 Pharmacy Patient Advocate Encounter  Insurance verification completed.    The patient is insured through [employer]. Patient has Medicare and is not eligible for a copay card, but may be able to apply for patient assistance or Medicare RX Payment Plan (Patient Must reach out to their plan, if eligible for payment plan), if available.    Ran test claim for Eliquis 5mg  and the current 30 day co-pay is $0.   This test claim was processed through [employer]- copay amounts may vary at other pharmacies due to [employer], or as the patient moves through the different stages of their insurance plan.

## 2024-07-20 NOTE — Plan of Care (Signed)
  Problem: Clinical Measurements: Goal: Will remain free from infection Outcome: Progressing   Problem: Safety: Goal: Ability to remain free from injury will improve Outcome: Progressing   Problem: Education: Goal: Knowledge of General Education information will improve Description: Including pain rating scale, medication(s)/side effects and non-pharmacologic comfort measures Outcome: Not Progressing   Problem: Health Behavior/Discharge Planning: Goal: Ability to manage health-related needs will improve Outcome: Not Progressing

## 2024-07-20 NOTE — [place] FL2 (Signed)
 [place]  MEDICAID FL2 LEVEL OF CARE FORM     IDENTIFICATION  Patient Name: Monica Mccoy Birthdate: November 06, 1965 Sex: female Admission Date (Current Location): 07/16/2024  [facility] and [place] Number:  Producer, Television/film/video and Address:  [facility], [address] [place], [place] [address]      Provider Number: [id number]  Attending Physician Name and Address:  Caleen Qualia, MD  Relative Name and Phone Number:       Current Level of Care: Hospital Recommended Level of Care: Skilled Nursing Facility Prior Approval Number:    Date Approved/Denied:   PASRR Number:    Discharge Plan: SNF    Current Diagnoses: Patient Active Problem List   Diagnosis Date Noted   Sepsis (HCC) 07/17/2024   Acute pyelonephritis 07/17/2024   Recurrent pulmonary embolism (HCC) 07/17/2024   MCI (mild cognitive impairment) 07/17/2024   History of DVT (deep vein thrombosis) 07/17/2024   Thrombocytopenia 07/17/2024   Transaminitis 07/17/2024   QT prolongation 07/17/2024   Morbid obesity (HCC) 04/09/2024   Malignant neoplasm of female breast (HCC) 04/09/2024   Debility 04/09/2024   Pain 04/09/2024   Complicated UTI (urinary tract infection) 08/17/2023   Obesity, Class III, BMI 40-49.9 (morbid obesity) (HCC) 06/16/2022   Palliative care encounter 06/09/2022   Goals of care, counseling/discussion 06/09/2022   Counseling and coordination of care 06/09/2022   Altered mental status 06/09/2022   COVID-19 virus infection 06/08/2022   Generalized weakness 06/08/2022   Tendinitis of both ankles 04/25/2022   Pedal edema 04/13/2022   Cholelithiasis 10/16/2021   Uterine fibroid 10/16/2021   Aortic atherosclerosis 10/16/2021   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   Localization-related (focal) (partial) symptomatic epilepsy and epileptic syndromes with complex partial seizures, not intractable, without status epilepticus (HCC) 09/11/2021   Essential hypertension  05/30/2021   Port-A-Cath in place 01/16/2021   Migraine without aura 12/19/2020   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) 09/22/2020   History of seizure 06/29/2020   Gastroesophageal reflux disease without esophagitis 06/29/2020   Other chronic pain 06/29/2020   Moderate episode of recurrent major depressive disorder (HCC) 06/29/2020   Urinary incontinence 06/29/2020    Orientation RESPIRATION BLADDER Height & Weight      (disoriented)  Normal Incontinent Weight: (!) 315 lb (142.9 kg) Height:  5' 6 (167.6 cm)  BEHAVIORAL SYMPTOMS/MOOD NEUROLOGICAL BOWEL NUTRITION STATUS    Convulsions/Seizures Incontinent Diet (heart healthy)  AMBULATORY STATUS COMMUNICATION OF NEEDS Skin   Extensive Assist Verbally Normal                       Personal Care Assistance Level of Assistance  Bathing, Feeding, Dressing Bathing Assistance: Maximum assistance Feeding assistance: Maximum assistance Dressing Assistance: Maximum assistance     Functional Limitations Info  Speech     Speech Info: Impaired (dysarthria, delayed responses)    SPECIAL CARE FACTORS FREQUENCY  PT (By licensed PT), OT (By licensed OT)     PT Frequency: 5x/wk OT Frequency: 5x/wk            Contractures Contractures Info: Not present    Additional Factors Info  Code Status, Allergies, Psychotropic Code Status Info: DNR Allergies Info: Dilaudid (Hydromorphone Hcl), Morphine And Codeine Psychotropic Info: Zoloft  25mg  daily         Current Medications (07/20/2024):  This is the current hospital active medication list Current Facility-Administered Medications  Medication Dose Route Frequency Provider Last Rate Last Admin   acetaminophen  (TYLENOL ) tablet 650  mg  650 mg Oral Q6H PRN Sundil, Subrina, MD       Or   acetaminophen  (TYLENOL ) suppository 650 mg  650 mg Rectal Q6H PRN Sundil, Subrina, MD       albuterol  (PROVENTIL ) (2.5 MG/3ML) 0.083% nebulizer solution 2.5 mg  2.5 mg  Nebulization Q6H PRN Sundil, Subrina, MD   2.5 mg at 07/19/24 2019   apixaban  (ELIQUIS ) tablet 10 mg  10 mg Oral BID Amin, Sumayya, MD   10 mg at 07/20/24 9040   Followed by   NOREEN ON 07/24/2024] apixaban  (ELIQUIS ) tablet 5 mg  5 mg Oral BID Amin, Sumayya, MD       ceFEPIme  (MAXIPIME ) 2 g in sodium chloride  0.9 % 100 mL IVPB  2 g Intravenous Q8H Amin, Sumayya, MD   Stopped at 07/20/24 0522   Chlorhexidine  Gluconate Cloth 2 % PADS 6 each  6 each Topical Daily Sundil, Subrina, MD   6 each at 07/20/24 1044   levETIRAcetam  (KEPPRA ) tablet 500 mg  500 mg Oral BID Amin, Sumayya, MD   500 mg at 07/20/24 9040   pantoprazole  (PROTONIX ) EC tablet 40 mg  40 mg Oral Daily Sundil, Subrina, MD   40 mg at 07/20/24 1000   senna-docusate (Senokot-S) tablet 1 tablet  1 tablet Oral QHS PRN Sundil, Subrina, MD       sertraline  (ZOLOFT ) tablet 25 mg  25 mg Oral Daily Sundil, Subrina, MD   25 mg at 07/20/24 9040   sodium chloride  flush (NS) 0.9 % injection 10-40 mL  10-40 mL Intracatheter PRN Sundil, Subrina, MD       sodium chloride  flush (NS) 0.9 % injection 3 mL  3 mL Intravenous Q12H Sundil, Subrina, MD   3 mL at 07/20/24 1044   sodium chloride  flush (NS) 0.9 % injection 3 mL  3 mL Intravenous Q12H Sundil, Subrina, MD   3 mL at 07/20/24 1043   sodium chloride  flush (NS) 0.9 % injection 3 mL  3 mL Intravenous PRN Sundil, Subrina, MD       thiamine  (VITAMIN B1) tablet 100 mg  100 mg Oral Daily Sundil, Subrina, MD   100 mg at 07/20/24 1000     Discharge Medications: Please see discharge summary for a list of discharge medications.  Relevant Imaging Results:  Relevant Lab Results:   Additional Information SS#: [id number]  Almarie CHRISTELLA Goodie, LCSW

## 2024-07-20 NOTE — Care Management Important Message (Signed)
 Important Message  Patient Details  Name: Monica Mccoy MRN: [id number] Date of Birth: [birth year]   Important Message Given:  Yes - Medicare IM     [name] 07/20/2024, 1:55 PM

## 2024-07-20 NOTE — Progress Notes (Addendum)
 PHARMACY - ANTICOAGULATION CONSULT NOTE  Pharmacy Consult for heparin >> apixaban   Indication: extensive pulmonary embolus  Allergies  Allergen Reactions   Dilaudid [Hydromorphone Hcl] Other (See Comments)    Confusion & hallucinations   Morphine And Codeine Other (See Comments)    Headaches     Patient Measurements: Height: 5' 6 (167.6 cm) Weight: (!) 142.9 kg (315 lb) IBW/kg (Calculated) : 59.3 HEPARIN  DW (KG): 94.8  Vital Signs: Temp: 97.9 F (36.6 C) (11/24 0737) Temp Source: Oral (11/24 0737) BP: 130/95 (11/24 0737) Pulse Rate: 73 (11/24 0737)  Labs: Recent Labs    07/18/24 0552 07/19/24 0422 07/19/24 1558 07/19/24 2227 07/20/24 0130  HGB 12.2  --  12.3  --  13.2  HCT 37.5  --  36.8  --  40.4  PLT 136*  --  180  --  181  HEPARINUNFRC 0.48   < > 0.92* 0.73* 0.64  CREATININE 0.88  --   --   --   --    < > = values in this interval not displayed.    Estimated Creatinine Clearance: 103.2 mL/min (by C-G formula based on SCr of 0.88 mg/dL).   Medical History: Past Medical History:  Diagnosis Date   Anxiety    Breast cancer metastasized to brain [facility])    Left breast   Chronic pain after cancer treatment    Depression    GERD (gastroesophageal reflux disease)    History of cancer chemotherapy    Breast cancer left breast   Seizure [facility])     Assessment: 2 yoF presented with breast cancer metastasized to the brain found to have bilateral pulmonary emboli extending to all 5 lobes with RHS. Patient was seen 9/9 for acute saddle PE with no RHS  and was started on Eliquis  but has been off for a month. Last dose at facility was given 06/17/2024. No anticoagulation prior to admission. Pharmacy consulted for heparin .    Heparin  level 0.64 is therapeutic on 1300 units/hr. CBC stable. Switch to apixaban  per MD. Has received 3 days therapeutic heparin .   Goal of Therapy:  Heparin  level 0.3-0.7 units/ml Monitor platelets by anticoagulation protocol: Yes   Plan:   Stop heparin  1300 units/hr Apixaban  10mg  BID x4 days then 5mg  BID  Monitor signs/symptoms of bleeding  Hold divalproic acid due to interaction with apixaban    Jinnie Door, PharmD, BCPS, BCCP Clinical Pharmacist  Please check AMION for all [facility] Pharmacy phone numbers After 10:00 PM, call Main Pharmacy [phone]

## 2024-07-21 LAB — CULTURE, BLOOD (ROUTINE X 2)
Culture: NO GROWTH
Special Requests: ADEQUATE

## 2024-09-03 ENCOUNTER — Encounter: Payer: Self-pay | Admitting: Hematology and Oncology

## 2025-06-16 ENCOUNTER — Inpatient Hospital Stay: Admitting: Hematology and Oncology

## 2025-06-17 ENCOUNTER — Inpatient Hospital Stay: Admitting: Internal Medicine
